# Patient Record
Sex: Female | Born: 1950 | Race: White | Hispanic: No | Marital: Married | Smoking: Never smoker
Health system: Southern US, Community
[De-identification: ages and names within clinical notes are randomized; demographics above are authoritative.]

## PROBLEM LIST (undated history)

## (undated) DIAGNOSIS — E119 Type 2 diabetes mellitus without complications: Secondary | ICD-10-CM

## (undated) DIAGNOSIS — Z973 Presence of spectacles and contact lenses: Secondary | ICD-10-CM

## (undated) DIAGNOSIS — N183 Chronic kidney disease, stage 3 unspecified: Secondary | ICD-10-CM

## (undated) DIAGNOSIS — E7800 Pure hypercholesterolemia, unspecified: Secondary | ICD-10-CM

## (undated) DIAGNOSIS — E282 Polycystic ovarian syndrome: Secondary | ICD-10-CM

## (undated) DIAGNOSIS — N950 Postmenopausal bleeding: Secondary | ICD-10-CM

## (undated) DIAGNOSIS — I10 Essential (primary) hypertension: Secondary | ICD-10-CM

## (undated) DIAGNOSIS — K219 Gastro-esophageal reflux disease without esophagitis: Secondary | ICD-10-CM

## (undated) DIAGNOSIS — C539 Malignant neoplasm of cervix uteri, unspecified: Secondary | ICD-10-CM

## (undated) DIAGNOSIS — N888 Other specified noninflammatory disorders of cervix uteri: Secondary | ICD-10-CM

## (undated) DIAGNOSIS — D539 Nutritional anemia, unspecified: Secondary | ICD-10-CM

## (undated) DIAGNOSIS — Z923 Personal history of irradiation: Secondary | ICD-10-CM

## (undated) HISTORY — DX: Polycystic ovarian syndrome: E28.2

## (undated) HISTORY — DX: Essential (primary) hypertension: I10

## (undated) HISTORY — DX: Pure hypercholesterolemia, unspecified: E78.00

## (undated) HISTORY — DX: Postmenopausal bleeding: N95.0

## (undated) HISTORY — DX: Other specified noninflammatory disorders of cervix uteri: N88.8

## (undated) HISTORY — PX: WISDOM TOOTH EXTRACTION: SHX21

## (undated) HISTORY — DX: Type 2 diabetes mellitus without complications: E11.9

---

## 1997-12-08 ENCOUNTER — Other Ambulatory Visit: Admission: RE | Admit: 1997-12-08 | Discharge: 1997-12-08 | Payer: Self-pay | Admitting: Gynecology

## 2017-02-06 ENCOUNTER — Other Ambulatory Visit: Payer: Self-pay | Admitting: Adult Health

## 2017-02-06 DIAGNOSIS — Z1231 Encounter for screening mammogram for malignant neoplasm of breast: Secondary | ICD-10-CM

## 2019-11-03 ENCOUNTER — Ambulatory Visit: Payer: Medicare PPO | Attending: Internal Medicine

## 2019-11-03 DIAGNOSIS — Z23 Encounter for immunization: Secondary | ICD-10-CM | POA: Insufficient documentation

## 2019-11-03 NOTE — Progress Notes (Signed)
   Covid-19 Vaccination Clinic  Name:  Misty Deleon    MRN: [id number] DOB: 02-09-1951  11/03/2019  Misty Deleon was observed post Covid-19 immunization for 15 minutes without incidence. She was provided with Vaccine Information Sheet and instruction to access the V-Safe system.   Misty Deleon was instructed to call 911 with any severe reactions post vaccine: Marland Kitchen Difficulty breathing  . Swelling of your face and throat  . A fast heartbeat  . A bad rash all over your body  . Dizziness and weakness    Immunizations Administered    Name Date Dose VIS Date Route   Pfizer COVID-19 Vaccine 11/03/2019  9:12 AM 0.3 mL 08/22/2019 Intramuscular   Manufacturer: [place]   Lot: [id number]   [place]: [id number]

## 2019-11-25 ENCOUNTER — Ambulatory Visit: Payer: Medicare PPO | Attending: Internal Medicine

## 2019-11-25 DIAGNOSIS — Z23 Encounter for immunization: Secondary | ICD-10-CM

## 2019-11-25 NOTE — Progress Notes (Signed)
   Covid-19 Vaccination Clinic  Name:  Misty Deleon    MRN: [id number] DOB: 08/09/1951  11/25/2019  [name] was observed post Covid-19 immunization for 15 minutes without incident. She was provided with Vaccine Information Sheet and instruction to access the V-Safe system.   [name] was instructed to call 911 with any severe reactions post vaccine: Marland Kitchen Difficulty breathing  . Swelling of face and throat  . A fast heartbeat  . A bad rash all over body  . Dizziness and weakness   Immunizations Administered    Name Date Dose VIS Date Route   Pfizer COVID-19 Vaccine 11/25/2019  8:51 AM 0.3 mL 08/22/2019 Intramuscular   Manufacturer: [place]   Lot: [id number]   [place]: [id number]

## 2020-08-19 ENCOUNTER — Encounter: Payer: Self-pay | Admitting: Gynecologic Oncology

## 2020-08-20 ENCOUNTER — Encounter: Payer: Self-pay | Admitting: Gynecologic Oncology

## 2020-08-20 ENCOUNTER — Other Ambulatory Visit: Payer: Self-pay

## 2020-08-20 ENCOUNTER — Inpatient Hospital Stay (HOSPITAL_BASED_OUTPATIENT_CLINIC_OR_DEPARTMENT_OTHER): Payer: Medicare PPO | Admitting: Gynecologic Oncology

## 2020-08-20 ENCOUNTER — Inpatient Hospital Stay: Payer: Medicare PPO | Attending: Gynecologic Oncology

## 2020-08-20 VITALS — BP 131/68 | HR 105 | Temp 96.6°F | Resp 18 | Ht 62.0 in | Wt 215.0 lb

## 2020-08-20 DIAGNOSIS — E7800 Pure hypercholesterolemia, unspecified: Secondary | ICD-10-CM | POA: Insufficient documentation

## 2020-08-20 DIAGNOSIS — Z79899 Other long term (current) drug therapy: Secondary | ICD-10-CM | POA: Diagnosis not present

## 2020-08-20 DIAGNOSIS — E282 Polycystic ovarian syndrome: Secondary | ICD-10-CM | POA: Insufficient documentation

## 2020-08-20 DIAGNOSIS — Z7984 Long term (current) use of oral hypoglycemic drugs: Secondary | ICD-10-CM | POA: Insufficient documentation

## 2020-08-20 DIAGNOSIS — I10 Essential (primary) hypertension: Secondary | ICD-10-CM | POA: Diagnosis not present

## 2020-08-20 DIAGNOSIS — D539 Nutritional anemia, unspecified: Secondary | ICD-10-CM | POA: Diagnosis not present

## 2020-08-20 DIAGNOSIS — N183 Chronic kidney disease, stage 3 unspecified: Secondary | ICD-10-CM | POA: Insufficient documentation

## 2020-08-20 DIAGNOSIS — E119 Type 2 diabetes mellitus without complications: Secondary | ICD-10-CM | POA: Diagnosis not present

## 2020-08-20 DIAGNOSIS — C539 Malignant neoplasm of cervix uteri, unspecified: Secondary | ICD-10-CM | POA: Diagnosis not present

## 2020-08-20 DIAGNOSIS — N939 Abnormal uterine and vaginal bleeding, unspecified: Secondary | ICD-10-CM

## 2020-08-20 DIAGNOSIS — R87614 Cytologic evidence of malignancy on smear of cervix: Secondary | ICD-10-CM

## 2020-08-20 LAB — CBC (CANCER CENTER ONLY)
HCT: 28.6 % — ABNORMAL LOW (ref 36.0–46.0)
Hemoglobin: 9.7 g/dL — ABNORMAL LOW (ref 12.0–15.0)
MCH: 29.1 pg (ref 26.0–34.0)
MCHC: 33.9 g/dL (ref 30.0–36.0)
MCV: 85.9 fL (ref 80.0–100.0)
Platelet Count: 368 10*3/uL (ref 150–400)
RBC: 3.33 MIL/uL — ABNORMAL LOW (ref 3.87–5.11)
RDW: 13.2 % (ref 11.5–15.5)
WBC Count: 9.6 10*3/uL (ref 4.0–10.5)
nRBC: 0 % (ref 0.0–0.2)

## 2020-08-20 LAB — BASIC METABOLIC PANEL - CANCER CENTER ONLY
Anion gap: 13 (ref 5–15)
BUN: 15 mg/dL (ref 8–23)
CO2: 26 mmol/L (ref 22–32)
Calcium: 9.9 mg/dL (ref 8.9–10.3)
Chloride: 97 mmol/L — ABNORMAL LOW (ref 98–111)
Creatinine: 1.34 mg/dL — ABNORMAL HIGH (ref 0.44–1.00)
GFR, Estimated: 43 mL/min — ABNORMAL LOW (ref >60)
Glucose, Bld: 166 mg/dL — ABNORMAL HIGH (ref 70–99)
Potassium: 3.6 mmol/L (ref 3.5–5.1)
Sodium: 136 mmol/L (ref 135–145)

## 2020-08-20 NOTE — Patient Instructions (Signed)
It was a pleasure meeting you today.  I will call you once I have the results of your imaging studies back.  I will also let you know when we get the biopsy results back from your gynecologist clinic.  Based on my exam today, there is a tumor replacing most of the cervix.  This is compatible with a cervix cancer.  I do not feel definite evidence that the cancer has spread beyond the cervix.  The biopsy and imaging studies will help [place] make the decision about the next step in your treatment, which will either be talking about radical surgery or chemotherapy with radiation.

## 2020-08-20 NOTE — Progress Notes (Signed)
GYNECOLOGIC ONCOLOGY NEW PATIENT CONSULTATION   Patient Name: Misty Deleon  Patient Age: 69 y.o. Date of Service: 08/20/20 Referring Provider: Dr. Murrell Redden  Primary Care Provider: Alvester Chou, NP Consulting Provider: Jeral Pinch, MD   Assessment/Plan:  Postmenopausal female presenting with at clinical stage I B2 cervical cancer.  I reviewed in detail with the patient her Pap test results as well as my exam today, consistent with cervical cancer.  She had a biopsy performed on 12/3 which is still pending as of this morning.  Based on my exam, I cannot definitively say that there is extension of the tumor beyond the cervix, although I am concerned given the size of the tumor and her high-grade histology noted on Pap smear.  We reviewed the patient's diagnosis of early stage cervical cancer. On today's examination she is a clinical stage IB2 with a gross lesion estimated at 3-4 cm. Treatment options include radical hysterectomy with pelvic lymphadenectomy or radiation. The risks and benefits of both were reviewed. I have emphasized that the cure rates for these two treatments are equivalent; however, I discussed that the toxicity profiles are different between these two modalities. I think the patient may be a candidate for surgery, although I am concerned based on the pathologic risk factors that I already know, she may be at very high risk of needing adjuvant therapy.  Ultimately, if her biopsy results as well as imaging confirm a high likelihood of needing radiation treatment after surgical intervention, then I will very likely recommend primary chemoradiation.  Risks of primary radiotherapy with sensitizing chemotherapy can include cystitis, proctitis, chronic rectal bleeding sigmoid stricture, small bowel obstruction, ureteral stricture, urinary or enteral fistula, loss of sexual function related to vaginal shortening and atrophy and menopause due to loss of ovarian function.   Surgery  is associated with risks of bleeding, blood transfusion, injury to the bowel, bladder, ureter, urinary fistula, vaginal dehiscence, prolonged or permanent bladder dysfunction potentially requiring self-catheterization, lymphedema, infection, thromboembolic events, and possible effects on sexual function. We discussed that intraoperatively, the finding of previously unsuspected spread outside the cervix may lead to the surgery being aborted. Additionally, high or intermediate-risk factors on the final pathology report, such as positive lymph nodes, deep invasion, large tumor size and lymphatic invasion, will likely lead to a recommendation of postoperative chemotherapy and radiation.   She desires to proceed with radical hysterectomy. Prior to surgery, a PET/CT has been/will be ordered to rule out any distant metastasis that would preclude the patient from being a surgical candidate.   Assuming that the patient is a surgical candidate following imaging, radical hysterectomy via a minimally invasive approach or abdominal procedure was discussed. We reviewed that prior to early 2018 our practice had been to routinely proceed with a robotic assisted procedure. We discussed that prospective data with endometrial cancer showed that outcomes from a minimally invasive procedure were not inferior as compared to one performed via laparotomy. We also discussed that retrospective data evaluating minimally invasive radical hysterectomy had not demonstrated worse outcomes as compared to an abdominal procedure. However, recent prospective data has suggested that there may be worse outcomes from a minimally invasive procedure. This data was published in November, 2018. A retrospective study from a [place] cancer database also seems to support this conclusion. We reviewed the results of these two studies.  I discussed my concern with the patient regarding her BMI and my ability to perform an adequate radical hysterectomy, which I  think will be more challenging  with an open procedure than minimally invasive. The pros and cons of both procedures were discussed in detail based on available published data. These include, but are not limited to, blood loss, injury to surrounding organs, lymphedema, need for further surgery, wound complications, VTE, and long-term bladder and bowel dysfunction.   Given the limitations of my exam today, I am going to get a pelvic MRI in addition to a PET scan.  I will follow up with her once I have the biopsy results as well as her imaging results, and we will finalize a treatment plan.  All the patient's questions were answered today.  REFERENCES:  1. Rosendo Gros PT, Frumovitz M, Pareja R, et al. Minimally invasive versus abdominal radical hysterectomy for cervicalcancer. N Engl J Med. DOI: 37.1062/IRSWNI6270350.\cb3  2. Melamed A, Margul DJ, Chen L, et al. Survival after minimally invasive radical hysterectomy for early-stage cervical cancer. N Engl J Med. KXF:81.8299/[id number]  3. Sert BM, Boggess JF, Heron Sabins, et al. Robot=assisted versus open radical hysterectomy: a multi-onstitutional experience for early-stage cervical cancer. Eur J Surg Oncol. 2016;42:513-22.  4. Concepcion Elk, Harlin Heys, [place], [place], [place]. Surgical and oncologic outcomes after robotic radical hysterectomy as compared to open radical hysterectomy in the treatment of early cervical cancer. J Gynecol Oncol. 2017;28(6):e82.  5. Soliman PT, Frumovitz M, Sun CC, et al. Radical hysterectomy: a comparison of surgical approaches after adoption of robotic surgery in gynecologic oncology. Gynecol Oncol. 2011;123:333-336.iation  A copy of this note was sent to the patient's referring provider.   65 minutes of total time was spent for this patient encounter, including preparation, face-to-face counseling with the patient and coordination of care, and documentation of the encounter.   Jeral Pinch, MD  Division of  Gynecologic Oncology  Department of Obstetrics and Gynecology  [facility]  ___________________________________________  Chief Complaint: Chief Complaint  Patient presents with  . Papanicolaou smear of cervix with cytologic evidence of mal  . Vaginal Bleeding    History of Present Illness:  Misty Deleon is a 69 y.o. y.o. female who is seen in consultation at the request of Dr. Murrell Redden an evaluation of postmenopausal bleeding and Pap test revealing poorly differentiated cancer.  The patient presented with postmenopausal bleeding.  She was seen for her annual exam on 12/3.  She endorses intermittent late bleeding for the last several months and then a month ago had heavy bleeding with clots, similar to menses.  Last Pap test was approximately 10 years ago, unsure if she has ever had an abnormal Pap.   Cervical mass noted on exam.  Pap test revealed poorly differentiated malignant cells.  Biopsy taken on that today are still pending.  Today, patient reports spotting intermittently for some number of months.  Initially this was just when she wiped after voiding and would experience this every 1-4 weeks.  Week before Thanksgiving, the patient experienced heavy bleeding with passage of clots that was similar to her menses.  Bleeding occurred for a couple of days, would stop, and then start again.  She called after this episode to be seen.  She denies any associated pain or cramping.  She does endorse having some brief cramping this morning.  She denies any weight loss although has had some decreased appetite since her visit on the third.  She denies any nausea or emesis.  She reports normal bowel and bladder function.  Her history is notable for diabetes, for which she takes Metformin.  She has hypertension and hyperlipidemia.  She denies any shortness of breath or chest pain at rest or with ambulation.  She lives in [place] with her husband.  She is a retired  Patent examiner.  PAST MEDICAL HISTORY:  Past Medical History:  Diagnosis Date  . Cervical mass   . Diabetes mellitus without complication ([place])   . Hypercholesteremia   . Hypertension   . PCOS (polycystic ovarian syndrome)   . PMB (postmenopausal bleeding)      PAST SURGICAL HISTORY:  Past Surgical History:  Procedure Laterality Date  . CESAREAN SECTION     X 2  . WISDOM TOOTH EXTRACTION     1980's    OB/GYN HISTORY:  OB History  Gravida Para Term Preterm AB Living  2 2          SAB IAB Ectopic Multiple Live Births               # Outcome Date GA Lbr Len/2nd Weight Sex Delivery Anes PTL Lv  2 Para           1 Para             No LMP recorded.  Age at menarche: 40  Age at menopause: 34 Hx of HRT: Denies Hx of STDs: Denies Last pap: 12/3 - see HPI History of abnormal pap smears: Yes.  Patient reports having some procedure (she describes this as a scraping) in the office in the remote past.  Reports normal Pap smears after.  Her last Pap smear was at least 10 years ago.  SCREENING STUDIES:  Last mammogram: 2021  Last colonoscopy: n/a Last bone mineral density: n/a  MEDICATIONS: Outpatient Encounter Medications as of 08/20/2020  Medication Sig  . ezetimibe (ZETIA) 10 MG tablet Take 10 mg by mouth daily.  Marland Kitchen lisinopril (ZESTRIL) 20 MG tablet Take 20 mg by mouth daily.  . metFORMIN (GLUCOPHAGE) 1000 MG tablet Take 1,000 mg by mouth 2 (two) times daily with a meal.  . rosuvastatin (CRESTOR) 40 MG tablet Take 40 mg by mouth daily.  . sitaGLIPtin (JANUVIA) 50 MG tablet Take 50 mg by mouth 2 (two) times daily.   No facility-administered encounter medications on file as of 08/20/2020.    ALLERGIES:  No Known Allergies   FAMILY HISTORY:  Family History  Problem Relation Age of Onset  . Diabetes Father   . Brain cancer Son   . Breast cancer Neg Hx   . Colon cancer Neg Hx   . Ovarian cancer Neg Hx   . Endometrial cancer Neg Hx   . Prostate cancer Neg Hx    . Pancreatic cancer Neg Hx      SOCIAL HISTORY:  Social Connections: Not on file    REVIEW OF SYSTEMS:  Denies appetite changes, fevers, chills, fatigue, unexplained weight changes. Denies hearing loss, neck lumps or masses, mouth sores, ringing in ears or voice changes. Denies cough or wheezing.  Denies shortness of breath. Denies chest pain or palpitations. Denies leg swelling. Denies abdominal distention, pain, blood in stools, constipation, diarrhea, nausea, vomiting, or early satiety. Denies pain with intercourse, dysuria, frequency, hematuria or incontinence. Denies hot flashes, pelvic pain, vaginal bleeding or vaginal discharge.   Denies joint pain, back pain or muscle pain/cramps. Denies itching, rash, or wounds. Denies dizziness, headaches, numbness or seizures. Denies swollen lymph nodes or glands, denies easy bruising or bleeding. Denies anxiety, depression, confusion, or decreased concentration.  Physical Exam:  Vital Signs for this encounter:  Blood pressure 131/68, pulse (!) 105, temperature (!) 96.6 F (35.9 C), temperature source Tympanic, resp. rate 18, height 5\' 2"  (1.575 m), weight 215 lb (97.5 kg), SpO2 100 %. Body mass index is 39.32 kg/m. General: Alert, oriented, no acute distress.  HEENT: Normocephalic, atraumatic. Sclera anicteric.  Chest: Clear to auscultation bilaterally. No wheezes, rhonchi, or rales. Cardiovascular: Regular rate and rhythm, no murmurs, rubs, or gallops.  Abdomen: Obese. Normoactive bowel sounds. Soft, nondistended, nontender to palpation. No masses or hepatosplenomegaly appreciated. No palpable fluid wave.  Extremities: Grossly normal range of motion. Warm, well perfused. No edema bilaterally.  Skin: No rashes or lesions.  Lymphatics: No cervical, supraclavicular, or inguinal adenopathy.  Back: No CVA or back tenderness to palpation. GU:  Normal external female genitalia.  No lesions. No discharge or bleeding.              Bladder/urethra:  No lesions or masses, well supported bladder             Vagina: Mildly atrophic.             Cervix: Almost completely replaced by lesion that is friable with very atypical appearing vasculature.  The mass in total measures approximately 3-4 cm.  There is a small amount of normal cervix seen anteriorly, this is not seen posteriorly.  On bimanual exam, the cervix is completely flush with the vagina posteriorly.  I do not feel extension of the tumor onto the upper vagina.  The cervix itself is barrel-shaped and the tumor very firm.             Uterus: 8-10 cm, mobile, no obvious parametrial involvement on rectovaginal exam although the exam is somewhat limited by the patient's body habitus as well as discomfort with the exam.             Adnexa: No masses appreciated.  LABORATORY AND RADIOLOGIC DATA:  Outside medical records were reviewed to synthesize the above history, along with the history and physical obtained during the visit.   Lab Results  Component Value Date   WBC 9.6 08/20/2020   HGB 9.7 (L) 08/20/2020   HCT 28.6 (L) 08/20/2020   PLT 368 08/20/2020   GLUCOSE 166 (H) 08/20/2020   NA 136 08/20/2020   K 3.6 08/20/2020   CL 97 (L) 08/20/2020   CREATININE 1.34 (H) 08/20/2020   BUN 15 08/20/2020   CO2 26 08/20/2020    Pap 08/13/20: G3 carcinoma (epithelial cell abnormality) HPV not performed  Pelvic ultrasound exam: 12/6 Uterus measures 8.6 x 4.7 x 4.2 cm.  Endometrial lining appears thickened with internal vascularity, cannot exclude polyp or other etiology.  Ovaries within normal size limits but somewhat obscured.

## 2020-08-23 ENCOUNTER — Telehealth: Payer: Self-pay | Admitting: *Deleted

## 2020-08-23 NOTE — Telephone Encounter (Signed)
Late entry----called patient's PCP Friday 12/10 and left a message to call the office back.   Called the patient's PCP office again today and left a message to call the office back. Need to check on the patient's creatine levels

## 2020-08-23 NOTE — Telephone Encounter (Signed)
Returned the call from the patient's PCP office and left message requesting the patient's last creatine level.

## 2020-08-25 ENCOUNTER — Telehealth: Payer: Self-pay | Admitting: Oncology

## 2020-08-25 NOTE — Telephone Encounter (Signed)
Called [facility] [phone]) and asked if the cervical biopsy and pap smear results are squamous cell carcinoma or adenocarcinoma.  They are gong to do a slide review and call with results in 24-48 hours.

## 2020-08-26 ENCOUNTER — Ambulatory Visit (HOSPITAL_COMMUNITY)
Admission: RE | Admit: 2020-08-26 | Discharge: 2020-08-26 | Disposition: A | Discharge status: Home / Self Care | Payer: Medicare PPO | Source: Ambulatory Visit | Attending: Gynecologic Oncology | Admitting: Gynecologic Oncology

## 2020-08-26 DIAGNOSIS — C539 Malignant neoplasm of cervix uteri, unspecified: Secondary | ICD-10-CM | POA: Diagnosis not present

## 2020-08-26 MED ORDER — GADOBUTROL 1 MMOL/ML IV SOLN
10.0000 mL | Freq: Once | INTRAVENOUS | Status: AC | PRN
  Administered 2020-08-26: 20:00:00 10 mL via INTRAVENOUS

## 2020-08-27 ENCOUNTER — Telehealth: Payer: Self-pay | Admitting: Oncology

## 2020-08-27 NOTE — Telephone Encounter (Signed)
Dr. 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  He is going to fax over an addended report.

## 2020-08-30 ENCOUNTER — Encounter: Payer: Self-pay | Admitting: Oncology

## 2020-08-30 ENCOUNTER — Telehealth: Payer: Self-pay

## 2020-08-30 ENCOUNTER — Telehealth: Payer: Self-pay | Admitting: Gynecologic Oncology

## 2020-08-30 ENCOUNTER — Telehealth: Payer: Self-pay | Admitting: Oncology

## 2020-08-30 ENCOUNTER — Encounter: Payer: Self-pay | Admitting: Hematology and Oncology

## 2020-08-30 DIAGNOSIS — N183 Chronic kidney disease, stage 3 unspecified: Secondary | ICD-10-CM | POA: Insufficient documentation

## 2020-08-30 DIAGNOSIS — E119 Type 2 diabetes mellitus without complications: Secondary | ICD-10-CM | POA: Insufficient documentation

## 2020-08-30 DIAGNOSIS — C539 Malignant neoplasm of cervix uteri, unspecified: Secondary | ICD-10-CM | POA: Insufficient documentation

## 2020-08-30 DIAGNOSIS — N1831 Chronic kidney disease, stage 3a: Secondary | ICD-10-CM | POA: Insufficient documentation

## 2020-08-30 DIAGNOSIS — D539 Nutritional anemia, unspecified: Secondary | ICD-10-CM | POA: Insufficient documentation

## 2020-08-30 NOTE — Telephone Encounter (Signed)
Misty Deleon and scheduled appointments to see Dr. Alvy Bimler on 09/01/20 at 2 PM (1:30 arrival) and Dr. Sondra Come - 09/09/20 - 2:30 nurse eval, 3:00 consult. She verbalized agreement and understanding.  Encouraged her to call with any questions or needs.

## 2020-08-30 NOTE — Telephone Encounter (Signed)
Called the patient to review pelvic MRI results.  Despite outside biopsy review, MRI it is very consistent with an advanced cervical cancer.  Mass shows invasion into the parametria, and) Smitty to the right ureter, down into the upper vagina.  There also enlarged pelvic lymph nodes.  Given these findings, I would like to proceed with getting the patient scheduled to see medical and radiation oncology.  We will proceed with PET scan as scheduled.  Jeral Pinch MD Gynecologic Oncology

## 2020-08-30 NOTE — Telephone Encounter (Signed)
Received Serum Creatine from 03-29-20 at [facility]. Creatine was 1.08. BUN was 15.   Dr. Berline Lopes notified. Copy of labs sent to be scanned into patient's EMR and a copy to patient's paper chart.

## 2020-08-30 NOTE — Progress Notes (Signed)
Faxed request for slides to [facility] at [phone].

## 2020-09-01 ENCOUNTER — Encounter: Payer: Self-pay | Admitting: Hematology and Oncology

## 2020-09-01 ENCOUNTER — Inpatient Hospital Stay: Payer: Medicare PPO | Admitting: Hematology and Oncology

## 2020-09-01 ENCOUNTER — Encounter (HOSPITAL_COMMUNITY)
Admission: RE | Admit: 2020-09-01 | Discharge: 2020-09-01 | Disposition: A | Discharge status: Home / Self Care | Payer: Medicare PPO | Source: Ambulatory Visit | Attending: Gynecologic Oncology | Admitting: Gynecologic Oncology

## 2020-09-01 ENCOUNTER — Other Ambulatory Visit: Payer: Self-pay

## 2020-09-01 VITALS — BP 144/64 | HR 120 | Temp 99.0°F | Resp 17 | Ht 62.0 in | Wt 216.8 lb

## 2020-09-01 DIAGNOSIS — N183 Chronic kidney disease, stage 3 unspecified: Secondary | ICD-10-CM | POA: Diagnosis not present

## 2020-09-01 DIAGNOSIS — R590 Localized enlarged lymph nodes: Secondary | ICD-10-CM | POA: Diagnosis not present

## 2020-09-01 DIAGNOSIS — I700 Atherosclerosis of aorta: Secondary | ICD-10-CM | POA: Diagnosis not present

## 2020-09-01 DIAGNOSIS — C539 Malignant neoplasm of cervix uteri, unspecified: Secondary | ICD-10-CM

## 2020-09-01 DIAGNOSIS — R87614 Cytologic evidence of malignancy on smear of cervix: Secondary | ICD-10-CM | POA: Insufficient documentation

## 2020-09-01 DIAGNOSIS — D539 Nutritional anemia, unspecified: Secondary | ICD-10-CM

## 2020-09-01 DIAGNOSIS — E119 Type 2 diabetes mellitus without complications: Secondary | ICD-10-CM

## 2020-09-01 DIAGNOSIS — L089 Local infection of the skin and subcutaneous tissue, unspecified: Secondary | ICD-10-CM | POA: Insufficient documentation

## 2020-09-01 DIAGNOSIS — R7989 Other specified abnormal findings of blood chemistry: Secondary | ICD-10-CM | POA: Insufficient documentation

## 2020-09-01 LAB — GLUCOSE, CAPILLARY: Glucose-Capillary: 190 mg/dL — ABNORMAL HIGH (ref 70–99)

## 2020-09-01 MED ORDER — PROCHLORPERAZINE MALEATE 10 MG PO TABS: 10.0000 mg | ORAL_TABLET | Freq: Four times a day (QID) | ORAL | 1 refills | Status: DC | PRN

## 2020-09-01 MED ORDER — ONDANSETRON HCL 8 MG PO TABS: 8.0000 mg | ORAL_TABLET | Freq: Three times a day (TID) | ORAL | 1 refills | Status: DC | PRN

## 2020-09-01 MED ORDER — FLUDEOXYGLUCOSE F - 18 (FDG) INJECTION
10.7000 | Freq: Once | INTRAVENOUS | Status: AC | PRN
  Administered 2020-09-01: 10:00:00 10.7 via INTRAVENOUS

## 2020-09-01 MED ORDER — DOXYCYCLINE HYCLATE 100 MG PO TABS: 100.0000 mg | ORAL_TABLET | Freq: Two times a day (BID) | ORAL | 0 refills | Status: DC

## 2020-09-01 MED ORDER — LIDOCAINE-PRILOCAINE 2.5-2.5 % EX CREA: TOPICAL_CREAM | CUTANEOUS | 3 refills | Status: DC

## 2020-09-01 NOTE — Assessment & Plan Note (Signed)
I plan to order iron studies and vitamin B12 before restart treatment

## 2020-09-01 NOTE — Assessment & Plan Note (Signed)
She is aware about the risk of complications and possibly uncontrolled diabetes while on treatment We discussed the importance of aggressive dietary modification

## 2020-09-01 NOTE — Progress Notes (Signed)
START OFF PATHWAY REGIMEN - Other   [id number]:Cisplatin 40 mg/m2 IV D1 q7 Days + RT:   A cycle is every 7 days:     Cisplatin   **Always confirm dose/schedule in your pharmacy ordering system**  Patient Characteristics: Intent of Therapy: Curative Intent, Discussed with Patient 

## 2020-09-01 NOTE — Assessment & Plan Note (Signed)
I reviewed multiple imaging studies I agree with additional view on the MRI to delineate whether her bladder is involved Clinically, it does not appear she has signs or symptoms of bladder fistula PET CT scan did not review significant disease in pelvic lymphadenopathy She has appointment scheduled to see radiation oncologist next week We discussed the importance of concurrent chemoradiation therapy with curative intent  We discussed the role of chemotherapy. The intent is of curative intent.  We discussed some of the risks, benefits, side-effects of cisplatin and its role as chemo sensitizing agent. The plan for weekly cisplatin for x5 doses along with radiation treatment.  Some of the short term side-effects included, though not limited to, including weight loss, life threatening infections, risk of allergic reactions, need for transfusions of blood products, nausea, vomiting, change in bowel habits, loss of hair, admission to hospital for various reasons, and risks of death.   Long term side-effects are also discussed including risks of infertility, permanent damage to nerve function, hearing loss, chronic fatigue, kidney damage with possibility needing hemodialysis, and rare secondary malignancy including bone marrow disorders.  The patient is aware that the response rates discussed earlier is not guaranteed.  After a long discussion, patient made an informed decision to proceed with the prescribed plan of care.   Patient education material was dispensed.  I will schedule chemo education class and port placement before we start treatment I will see her back again first week of January with tentative plan for weekly cisplatin to start January 10 Given her mild renal dysfunction, I plan to reduce the dose of cisplatin to 35 mg per metered square

## 2020-09-01 NOTE — Progress Notes (Signed)
[place] CONSULT NOTE  Patient Care Team: Alvester Chou, NP as PCP - General (Nurse Practitioner) Jacqulyn Liner, RN as Oncology Nurse Navigator (Oncology)  ASSESSMENT & PLAN:  Cervical cancer [facility]) I reviewed multiple imaging studies I agree with additional view on the MRI to delineate whether her bladder is involved Clinically, it does not appear she has signs or symptoms of bladder fistula PET CT scan did not review significant disease in pelvic lymphadenopathy She has appointment scheduled to see radiation oncologist next week We discussed the importance of concurrent chemoradiation therapy with curative intent  We discussed the role of chemotherapy. The intent is of curative intent.  We discussed some of the risks, benefits, side-effects of cisplatin and its role as chemo sensitizing agent. The plan for weekly cisplatin for x5 doses along with radiation treatment.  Some of the short term side-effects included, though not limited to, including weight loss, life threatening infections, risk of allergic reactions, need for transfusions of blood products, nausea, vomiting, change in bowel habits, loss of hair, admission to hospital for various reasons, and risks of death.   Long term side-effects are also discussed including risks of infertility, permanent damage to nerve function, hearing loss, chronic fatigue, kidney damage with possibility needing hemodialysis, and rare secondary malignancy including bone marrow disorders.  The patient is aware that the response rates discussed earlier is not guaranteed.  After a long discussion, patient made an informed decision to proceed with the prescribed plan of care.   Patient education material was dispensed.  I will schedule chemo education class and port placement before we start treatment I will see her back again first week of January with tentative plan for weekly cisplatin to start January 10 Given her mild renal  dysfunction, I plan to reduce the dose of cisplatin to 35 mg per metered square  Chronic kidney disease (CKD), stage III (moderate) ([place]) This could be related to dehydration or she might have chronic kidney disease related to diabetes  We discussed the importance of aggressive hydration I recommend discontinuation of lisinopril before starting on treatment and avoid NSAID   Deficiency anemia I plan to order iron studies and vitamin B12 before restart treatment  Diabetes mellitus without complication ([place]) She is aware about the risk of complications and possibly uncontrolled diabetes while on treatment We discussed the importance of aggressive dietary modification  Skin infection The area of abnormal changes on the skin is due to skin infection/boil I drained a bit more pus from the area and plan to start her on doxycycline for 10 days I am hopeful she can finish her antibiotics before port placement   Orders Placed This Encounter  Procedures  . IR IMAGING GUIDED PORT INSERTION    Standing Status:   Future    Standing Expiration Date:   09/01/2021    Order Specific Question:   Reason for Exam (SYMPTOM  OR DIAGNOSIS REQUIRED)    Answer:   need port to start chemo on 1/7    Order Specific Question:   Preferred Imaging Location?    Answer:   [facility]  . CBC with Differential ([place] Only)    Standing Status:   Standing    Number of Occurrences:   20    Standing Expiration Date:   09/01/2021  . Basic Metabolic Panel - [place] Only    Standing Status:   Standing    Number of Occurrences:   20    Standing  Expiration Date:   09/01/2021  . Magnesium    Standing Status:   Standing    Number of Occurrences:   20    Standing Expiration Date:   09/01/2021  . Iron and TIBC    Standing Status:   Standing    Number of Occurrences:   1    Standing Expiration Date:   09/01/2021  . Ferritin    Standing Status:   Standing    Number of Occurrences:   1     Standing Expiration Date:   09/01/2021  . Vitamin B12    Standing Status:   Standing    Number of Occurrences:   1    Standing Expiration Date:   09/01/2021    The total time spent in the appointment was 60 minutes encounter with patients including review of chart and various tests results, discussions about plan of care and coordination of care plan   All questions were answered. The patient knows to call the clinic with any problems, questions or concerns. No barriers to learning was detected.  Heath Lark, MD 12/22/20213:20 PM  CHIEF COMPLAINTS/PURPOSE OF CONSULTATION:  Cervical cancer, for further management  HISTORY OF PRESENTING ILLNESS:  Misty Deleon 69 y.o. female is here because of recent diagnosis of cervical cancer The patient was diagnosed after work-up for abnormal postmenopausal bleeding She had passage of large amount of clots and heavy bleeding recently Since her recent biopsy, her bleeding is less She has occasional cramps and take ibuprofen on a regular basis  I have reviewed her chart and materials related to her cancer extensively and collaborated history with the patient. Summary of oncologic history is as follows: Oncology History  Cervical cancer ([place])  08/27/2020 Imaging   MRI pelvis Large cervical mass with pelvic adenopathy, at least T2B N1 disease. Question of involvement of posterior urinary bladder (T4) for which additional images have been requested. Patient will return for additional images to answer this question. (Thinner section axial T2 without fat sat and sagittal T2 weighted imaging also without fat saturation.)   Small field-of-view images show that the parametrial extension comes in very close proximity to the RIGHT ureter.   08/30/2020 Initial Diagnosis   Cervical cancer ([place])   08/30/2020 Cancer Staging   Staging form: Cervix Uteri, AJCC Version 9 - Clinical stage from 08/30/2020: Stage IIIC1 (cT2, cN1, cM0) - Signed by Heath Lark,  MD on 08/30/2020   09/01/2020 PET scan   1. Large cervical mass extending into the endometrial canal to the level of the uterine fundus, associated with pelvic adenopathy to the level of the RIGHT common iliac chain. Please refer to MRI for further detail. 2. No signs of solid organ uptake or metastatic disease to the chest. 3. Subcutaneous nodule with marked increased metabolic activity, correlation with direct clinical inspection is suggested to exclude cutaneous neoplasm or subcutaneous nodule in this location. Complicated sebaceous cyst could also potentially have this appearance. 4. Uptake in the anal canal could likely physiologic. Given the intense nature of the uptake would suggest correlation with direct clinical inspection/examination. 5. Uptake in axillary lymph nodes with activity less than mediastinal blood pool likely small reactive lymph nodes, attention on follow-up. Morphologic features also with benign appearance.   09/20/2020 -  Chemotherapy   The patient had palonosetron (ALOXI) injection 0.25 mg, 0.25 mg, Intravenous,  Once, 0 of 6 cycles CISplatin (PLATINOL) 83 mg in sodium chloride 0.9 % 250 mL chemo infusion, 40 mg/m2 = 83 mg, Intravenous,  Once, 0 of 6 cycles fosaprepitant (EMEND) 150 mg in sodium chloride 0.9 % 145 mL IVPB, 150 mg, Intravenous,  Once, 0 of 6 cycles  for chemotherapy treatment.    Malignant neoplasm of cervix ([place])  09/01/2020 Initial Diagnosis   Malignant neoplasm of cervix ([place])   09/20/2020 -  Chemotherapy   The patient had palonosetron (ALOXI) injection 0.25 mg, 0.25 mg, Intravenous,  Once, 0 of 6 cycles CISplatin (PLATINOL) 83 mg in sodium chloride 0.9 % 250 mL chemo infusion, 40 mg/m2 = 83 mg, Intravenous,  Once, 0 of 6 cycles fosaprepitant (EMEND) 150 mg in sodium chloride 0.9 % 145 mL IVPB, 150 mg, Intravenous,  Once, 0 of 6 cycles  for chemotherapy treatment.     Her diabetes control at home is fair She denies peripheral  neuropathy  MEDICAL HISTORY:  Past Medical History:  Diagnosis Date  . Cervical mass   . Diabetes mellitus without complication ([place])   . Hypercholesteremia   . Hypertension   . PCOS (polycystic ovarian syndrome)   . PMB (postmenopausal bleeding)     SURGICAL HISTORY: Past Surgical History:  Procedure Laterality Date  . CESAREAN SECTION     X 2  . WISDOM TOOTH EXTRACTION     1980's    SOCIAL HISTORY: Social History   Socioeconomic History  . Marital status: Married    Spouse name: Not on file  . Number of children: 2  . Years of education: Not on file  . Highest education level: Not on file  Occupational History  . Occupation: retired Patent examiner  Tobacco Use  . Smoking status: Never Smoker  . Smokeless tobacco: Never Used  Vaping Use  . Vaping Use: Never used  Substance and Sexual Activity  . Alcohol use: Never  . Drug use: Never  . Sexual activity: Not Currently    Birth control/protection: Post-menopausal  Other Topics Concern  . Not on file  Social History Narrative  . Not on file   Social Determinants of Health   Financial Resource Strain: Not on file  Food Insecurity: Not on file  Transportation Needs: Not on file  Physical Activity: Not on file  Stress: Not on file  Social Connections: Not on file  Intimate Partner Violence: Not on file    FAMILY HISTORY: Family History  Problem Relation Age of Onset  . Diabetes Father   . Brain cancer Son   . Breast cancer Neg Hx   . Colon cancer Neg Hx   . Ovarian cancer Neg Hx   . Endometrial cancer Neg Hx   . Prostate cancer Neg Hx   . Pancreatic cancer Neg Hx     ALLERGIES:  has No Known Allergies.  MEDICATIONS:  Current Outpatient Medications  Medication Sig Dispense Refill  . doxycycline (VIBRA-TABS) 100 MG tablet Take 1 tablet (100 mg total) by mouth 2 (two) times daily. 20 tablet 0  . ezetimibe (ZETIA) 10 MG tablet Take 10 mg by mouth daily.    Marland Kitchen lidocaine-prilocaine (EMLA) cream  Apply to affected area once 30 g 3  . lisinopril (ZESTRIL) 20 MG tablet Take 20 mg by mouth daily.    . metFORMIN (GLUCOPHAGE) 1000 MG tablet Take 1,000 mg by mouth 2 (two) times daily with a meal.    . ondansetron (ZOFRAN) 8 MG tablet Take 1 tablet (8 mg total) by mouth every 8 (eight) hours as needed. Start on the third day after cisplatin chemotherapy. 30 tablet 1  . prochlorperazine (COMPAZINE)  10 MG tablet Take 1 tablet (10 mg total) by mouth every 6 (six) hours as needed (Nausea or vomiting). 30 tablet 1  . rosuvastatin (CRESTOR) 40 MG tablet Take 40 mg by mouth daily.    . sitaGLIPtin (JANUVIA) 50 MG tablet Take 50 mg by mouth 2 (two) times daily.     No current facility-administered medications for this visit.    REVIEW OF SYSTEMS: She has recurrent skin infection on her back, recently drained by her husband Constitutional: Denies fevers, chills or abnormal night sweats Eyes: Denies blurriness of vision, double vision or watery eyes Ears, nose, mouth, throat, and face: Denies mucositis or sore throat Respiratory: Denies cough, dyspnea or wheezes Cardiovascular: Denies palpitation, chest discomfort or lower extremity swelling Gastrointestinal:  Denies nausea, heartburn or change in bowel habits Lymphatics: Denies new lymphadenopathy or easy bruising Neurological:Denies numbness, tingling or new weaknesses Behavioral/Psych: Mood is stable, no new changes  All other systems were reviewed with the patient and are negative.  PHYSICAL EXAMINATION: ECOG PERFORMANCE STATUS: 1 - Symptomatic but completely ambulatory  Vitals:   09/01/20 1356  BP: (!) 144/64  Pulse: (!) 120  Resp: 17  Temp: 99 F (37.2 C)  SpO2: 99%   Filed Weights   09/01/20 1356  Weight: 216 lb 12.8 oz (98.3 kg)    GENERAL:alert, no distress and comfortable SKIN: Noted skin boil on her back.  I drained a bit more pus out of that EYES: normal, conjunctiva are pink and non-injected, sclera clear OROPHARYNX:no  exudate, no erythema and lips, buccal mucosa, and tongue normal  NECK: supple, thyroid normal size, non-tender, without nodularity LYMPH:  no palpable lymphadenopathy in the cervical, axillary or inguinal LUNGS: clear to auscultation and percussion with normal breathing effort HEART: regular rate & rhythm and no murmurs and no lower extremity edema ABDOMEN:abdomen soft, non-tender and normal bowel sounds Musculoskeletal:no cyanosis of digits and no clubbing  PSYCH: alert & oriented x 3 with fluent speech NEURO: no focal motor/sensory deficits  LABORATORY DATA:  I have reviewed the data as listed Lab Results  Component Value Date   WBC 9.6 08/20/2020   HGB 9.7 (L) 08/20/2020   HCT 28.6 (L) 08/20/2020   MCV 85.9 08/20/2020   PLT 368 08/20/2020   Recent Labs    08/20/20 0959  NA 136  K 3.6  CL 97*  CO2 26  GLUCOSE 166*  BUN 15  CREATININE 1.34*  CALCIUM 9.9  GFRNONAA 43*    RADIOGRAPHIC STUDIES: I have reviewed PET CT scan with the patient I have personally reviewed the radiological images as listed and agreed with the findings in the report. MR PELVIS W WO CONTRAST  Result Date: 08/27/2020 CLINICAL DATA:  Cervical cancer, staging evaluation. EXAM: MRI PELVIS WITHOUT AND WITH CONTRAST TECHNIQUE: Multiplanar multisequence MR imaging of the pelvis was performed both before and after administration of intravenous contrast. CONTRAST:  23mL GADAVIST GADOBUTROL 1 MMOL/ML IV SOLN COMPARISON:  None FINDINGS: Urinary Tract: No sign of hydronephrosis. Bifid collecting system on the LEFT. No distal ureteral distension. Parametrial extension on the RIGHT comes within 1-2 mm of the distal RIGHT ureter best seen on image 15 of series 5. indistinct boundary between the cervix and urinary bladder on single sagittal image also seen on sagittal image 20 of series 7 in addition to sagittal image 20 of series 18 Bowel: Colonic diverticulosis. No acute small bowel process or colonic process to the  extent visualized. Vascular/Lymphatic: Vascular structures in the pelvis are patent. Heterogeneous  enhancing LEFT external iliac lymph node (image 25, series 14) 1.4 cm. RIGHT internal iliac lymph node anterior to the sacrum 16 mm short axis dimension. Common iliac lymph node, lateral common iliac on the RIGHT 16 mm on image 10 of series 14. Posterior common iliac lymph node on the RIGHT (image 10, series 14 between psoas and spine measuring 9 mm with similar enhancement characteristics. Reproductive: Cervical mass measuring 4.2 x 3.9 x 4.3 cm. Stromal disruption and parametrial extension on the RIGHT involving approximately 120 degrees of the RIGHT lateral aspect of the cervix. Extension towards the RIGHT ureter as outlined above. Extension into the upper vagina. LEFT parametrial extension noted on image 15 of series 5. Extension also into the upper vagina greater on the LEFT than the RIGHT. Indistinct margin between the anterior aspect of this cervical mass in the mid cervix and the urinary bladder best seen on image 20 of series 7. Urinary bladder under distended. Other:  No ascites Musculoskeletal: No suspicious bone lesions identified. IMPRESSION: Large cervical mass with pelvic adenopathy, at least T2B N1 disease. Question of involvement of posterior urinary bladder (T4) for which additional images have been requested. Patient will return for additional images to answer this question. (Thinner section axial T2 without fat sat and sagittal T2 weighted imaging also without fat saturation.) Small field-of-view images show that the parametrial extension comes in very close proximity to the RIGHT ureter. Electronically Signed   By: Zetta Bills M.D.   On: 08/27/2020 16:02   NM PET Image Initial (PI) Skull Base To Thigh  Result Date: 09/01/2020 CLINICAL DATA:  Initial treatment strategy for uterine/cervical cancer staging in this 69 year old female. EXAM: NUCLEAR MEDICINE PET SKULL BASE TO THIGH TECHNIQUE:  10.7 mCi F-18 FDG was injected intravenously. Full-ring PET imaging was performed from the skull base to thigh after the radiotracer. CT data was obtained and used for attenuation correction and anatomic localization. Fasting blood glucose: 190 mg/dl COMPARISON:  MR pelvis from 08/26/2020 FINDINGS: Mediastinal blood pool activity: SUV max 3.98 Liver activity: SUV max NA NECK: No hypermetabolic lymph nodes in the neck. Incidental CT findings: none CHEST: Normal morphologic appearance of bilateral axillary lymph nodes with mild FDG uptake, maximum SUV of uptake in a LEFT axillary lymph node on image 63 of series 4 is 2.8. No frankly hypermetabolic lymph nodes in the chest Intensely hypermetabolic area in the subcutaneous fat and along the dermal layer of LEFT back (image 64, series 4) this is just to the LEFT of midline corresponding to a small ovoid area measuring 12 mm with a maximum SUV of 7.8 Incidental CT findings: Minimal calcified atheromatous plaque. Heart size is normal without pericardial effusion. Aortic caliber is normal. Central pulmonary vascular caliber is normal. Limited assessment of cardiovascular structures given lack of intravenous contrast. Calcified granuloma in the LEFT upper lobe. Calcified granulomata of about the LEFT hilum. No effusion. No consolidation. Airways are patent. Scattered small lymph nodes throughout the mediastinum ABDOMEN/PELVIS: Markedly hypermetabolic cervical mass. Hypermetabolic activity extends into the uterine body. There was some distension of the endometrium and heterogeneous signal within the endometrial canal on the previous study. Maximum SUV in the cervix and uterus, nearly uniform in terms of FDG uptake is approximately 20.1. This uptake encompasses approximately 8 cm craniocaudal extent along the axis of the uterus from the cervix through the uterine fundus. RIGHT common iliac lymph node (image 147, series 4) 1.4 cm, maximum SUV of 14.5 Similar activity seen in a  smaller lymph node  outlined in the MRI as well along posterior and lateral common iliac chain on image 147 as well this measures approximately 9 mm short axis. Internal iliac lymph node on the RIGHT with a maximum SUV of 10 measuring 1.4 cm on image 153 of series 4. LEFT external iliac lymph node (image 160, series 4) similarly hypermetabolic. Scattered small lymph nodes throughout the retroperitoneum lack profound uptake. No focal area of solid organ uptake. Background liver uptake is heterogeneous. Hypermetabolic area along the anal canal, of uncertain significance though likely physiologic Incidental CT findings: Mildly lobular hepatic contours. Spleen top normal size. Sludge in the gallbladder. Pancreas with normal noncontrast appearance. Adrenal glands are normal. Nephrolithiasis on the RIGHT, 5 mm upper pole calculus 7 mm RIGHT lower pole calculus. No hydronephrosis. Colonic diverticulosis. Normal appendix. Aortic atherosclerosis in the abdomen without aneurysmal dilation. Pelvic adenopathy as seen on recent MRI of the pelvis. See above for details regarding cervix and cervical uptake. SKELETON: No focal hypermetabolic activity to suggest skeletal metastasis. Soft tissue lesion as described in the upper to mid back in the subcutaneous fat and superficial soft tissues. Incidental CT findings: none IMPRESSION: 1. Large cervical mass extending into the endometrial canal to the level of the uterine fundus, associated with pelvic adenopathy to the level of the RIGHT common iliac chain. Please refer to MRI for further detail. 2. No signs of solid organ uptake or metastatic disease to the chest. 3. Subcutaneous nodule with marked increased metabolic activity, correlation with direct clinical inspection is suggested to exclude cutaneous neoplasm or subcutaneous nodule in this location. Complicated sebaceous cyst could also potentially have this appearance. 4. Uptake in the anal canal could likely physiologic. Given  the intense nature of the uptake would suggest correlation with direct clinical inspection/examination. 5. Uptake in axillary lymph nodes with activity less than mediastinal blood pool likely small reactive lymph nodes, attention on follow-up. Morphologic features also with benign appearance. Electronically Signed   By: Zetta Bills M.D.   On: 09/01/2020 12:46

## 2020-09-01 NOTE — Assessment & Plan Note (Signed)
The area of abnormal changes on the skin is due to skin infection/boil I drained a bit more pus from the area and plan to start her on doxycycline for 10 days I am hopeful she can finish her antibiotics before port placement

## 2020-09-01 NOTE — Assessment & Plan Note (Signed)
This could be related to dehydration or she might have chronic kidney disease related to diabetes  We discussed the importance of aggressive hydration I recommend discontinuation of lisinopril before starting on treatment and avoid NSAID

## 2020-09-06 ENCOUNTER — Other Ambulatory Visit: Payer: Self-pay | Admitting: Oncology

## 2020-09-06 ENCOUNTER — Encounter: Payer: Self-pay | Admitting: Gynecologic Oncology

## 2020-09-06 NOTE — Progress Notes (Signed)
I received a call from reading radiologist of additional pelvic MRI images. Reviewed image findings which are concerning for bladder serosa involvement by the tumor as well as involvement of the peritoneal reflection at the level of the sigmoid. We also discussed the FDG avid cystic lesion on the patient's back. I relayed these findings to the patient and she notes a history of approximately 10 years of intermittent cystic lesion in the  same place that sometimes requires antibiotic therapy. She is currently on an antibiotic.  Pricilla Holm MD

## 2020-09-06 NOTE — Progress Notes (Signed)
Gynecologic Oncology Multi-Disciplinary Disposition Conference Note  Date of the Conference: 09/06/2020  Patient Name: Misty Deleon  Referring Provider: Dr. Murrell Redden Primary GYN Oncologist: Dr. Berline Lopes  Stage/Disposition:  Clinical stage IB2 cervical cancer. Disposition is to primary chemoradiation.   This Multidisciplinary conference took place involving physicians from [place], [place], Radiation Oncology, Pathology, Radiology along with the Gynecologic Oncology Nurse Practitioner and RN.  Comprehensive assessment of the patient's malignancy, staging, need for surgery, chemotherapy, radiation therapy, and need for further testing were reviewed. Supportive measures, both inpatient and following discharge were also discussed. The recommended plan of care is documented. Greater than 35 minutes were spent correlating and coordinating this patient's care.

## 2020-09-08 ENCOUNTER — Institutional Professional Consult (permissible substitution): Payer: Medicare PPO | Admitting: Radiation Oncology

## 2020-09-09 ENCOUNTER — Encounter: Payer: Self-pay | Admitting: Hematology and Oncology

## 2020-09-09 ENCOUNTER — Other Ambulatory Visit: Payer: Self-pay

## 2020-09-09 ENCOUNTER — Ambulatory Visit
Admission: RE | Admit: 2020-09-09 | Discharge: 2020-09-09 | Disposition: A | Discharge status: Home / Self Care | Payer: Medicare PPO | Source: Ambulatory Visit | Attending: Radiation Oncology | Admitting: Radiation Oncology

## 2020-09-09 ENCOUNTER — Encounter: Payer: Self-pay | Admitting: Radiation Oncology

## 2020-09-09 ENCOUNTER — Inpatient Hospital Stay: Payer: Medicare PPO

## 2020-09-09 VITALS — BP 159/73 | HR 106 | Temp 97.4°F | Resp 18 | Ht 62.0 in | Wt 213.1 lb

## 2020-09-09 DIAGNOSIS — K5730 Diverticulosis of large intestine without perforation or abscess without bleeding: Secondary | ICD-10-CM | POA: Insufficient documentation

## 2020-09-09 DIAGNOSIS — E282 Polycystic ovarian syndrome: Secondary | ICD-10-CM | POA: Insufficient documentation

## 2020-09-09 DIAGNOSIS — C539 Malignant neoplasm of cervix uteri, unspecified: Secondary | ICD-10-CM | POA: Insufficient documentation

## 2020-09-09 DIAGNOSIS — Z7984 Long term (current) use of oral hypoglycemic drugs: Secondary | ICD-10-CM | POA: Diagnosis not present

## 2020-09-09 DIAGNOSIS — E119 Type 2 diabetes mellitus without complications: Secondary | ICD-10-CM | POA: Insufficient documentation

## 2020-09-09 DIAGNOSIS — N200 Calculus of kidney: Secondary | ICD-10-CM | POA: Diagnosis not present

## 2020-09-09 DIAGNOSIS — Z809 Family history of malignant neoplasm, unspecified: Secondary | ICD-10-CM | POA: Diagnosis not present

## 2020-09-09 DIAGNOSIS — Z79899 Other long term (current) drug therapy: Secondary | ICD-10-CM | POA: Diagnosis not present

## 2020-09-09 DIAGNOSIS — I10 Essential (primary) hypertension: Secondary | ICD-10-CM | POA: Insufficient documentation

## 2020-09-09 DIAGNOSIS — E7800 Pure hypercholesterolemia, unspecified: Secondary | ICD-10-CM | POA: Insufficient documentation

## 2020-09-09 NOTE — Progress Notes (Signed)
Met with patient in lobby to introduce myself as Financial Resource Specialist and to offer available resources.  Discussed one-time $1000 [employer] grant and qualifications to assist with personal expenses while going through treatment.  Gave her my card if interested in applying and for any additional financial questions or concerns. 

## 2020-09-09 NOTE — Progress Notes (Signed)
Radiation none         [phone] ________________________________  Initial Outpatient Consultation  Name: Misty PoeConnie S Deleon MRN: [id number]  Date: 09/09/2020  DOB: 1950/10/11  WU:[id number]:Barr, Raynelle FanningJulie, NP  Marletta LorBarr, Julie, NP   REFERRING PHYSICIAN: Marletta LorBarr, Julie, NP  DIAGNOSIS: The encounter diagnosis was Malignant neoplasm of cervix, unspecified site [facility](HCC).  Clincal stage IIIC-1 (cT2, cN1, cM0) poorly differentiated cervical cancer  HISTORY OF PRESENT ILLNESS::Misty Kathie RhodesS Thera Deleon is a 69 y.o. female who is seen as a courtesy of Dr. Pricilla Holmucker for an opinion concerning radiation therapy as part of management for her recently diagnosed cervical cancer.. The patient presented to Dr. Amado NashAlmquist on 08/13/2020 for her annual exam. At that time, she reported a several month history of intermittent postmenopausal bleeding. On examination, a cervical mass was noted. PAP smear revealed poorly differentiated malignant cells. 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  Subsequently, the patient was seen in consultation with Dr. Pricilla Holmucker on 08/20/2020, during which time they discussed treatment options including radical hysterectomy with pelvic lymphadenectomy vs radiation. The patient desired to proceed with surgery.  MRI of pelvis on 08/26/2020 showed a large cervical mass with pelvic adenopathy, at least T2B N1 disease. There was also question of involvement of posterior urinary bladder (T4) for which additional images were requested. There were, however, small field-of-view images that showed that the parametrial extension came in very close proximity to the right ureter. An addendum was added and stated that there was also abnormal signal that extended throughout the endometrial canal arising from the cervix. Distension of the cervical canal was present. Enhancement pattern was less than expected for frank extension of cervical tumor into the endometrium. Additional imaging were concerning for bladder  seroma involvement by the tumor as well as involvement of the peritoneal reflection at the level of the sigmoid. There was also noted to be an FDG avid cystic lesion on the patient's back (patient noted that it has been there for approximately 10 years and sometimes required antibiotic therapy, which she is currently on).  PET scan on 09/01/2020 showed a large cervical mass that extended into the endometrial canal to the level of the uterine fundus, associated with pelvic adenopathy to the level of the right common iliac chain. There were no signs of solid organ uptake or metastatic disease to the chest. Additionally, there was a subcutaneous nodule with marked increased metabolic activity; correlation with direct clinical inspection was suggested to exclude cutaneous neoplasm or subcutaneous nodule in that location. Complicated sebaceous cyst could also potentially have that appearance. There was also uptake noted in the anal canal that could have likely been physiologic. Given the intense nature of the uptake, correlation with direct clinical inspection/examination was recommended. Finally, there was uptake in axillary lymph nodes with activity less than mediastinal blood pool, likely small reactive lymph nodes. Morphologic features were also with benign appearance.  The patient was seen in consultation with Dr. Bertis RuddyGorsuch on 09/01/2020, during which time they discussed concurrent chemoradiation therapy with curative intent.  The patient's case was discussed at the Gynecologic Oncology Multi-Disciplinary Disposition Conference on 09/06/2020. It was recommended that she proceed with primary chemoradiation.  PREVIOUS RADIATION THERAPY: No  PAST MEDICAL HISTORY:  Past Medical History:  Diagnosis Date  . Cervical mass   . Diabetes mellitus without complication (HCC)   . Hypercholesteremia   . Hypertension   . PCOS (polycystic ovarian syndrome)   . PMB (postmenopausal bleeding)     PAST SURGICAL  HISTORY: Past Surgical History:  Procedure Laterality Date  . CESAREAN SECTION     X 2  . WISDOM TOOTH EXTRACTION     1980's    FAMILY HISTORY:  Family History  Problem Relation Age of Onset  . Diabetes Father   . Brain cancer Son   . Breast cancer Neg Hx   . Colon cancer Neg Hx   . Ovarian cancer Neg Hx   . Endometrial cancer Neg Hx   . Prostate cancer Neg Hx   . Pancreatic cancer Neg Hx     SOCIAL HISTORY:  Social History   Tobacco Use  . Smoking status: Never Smoker  . Smokeless tobacco: Never Used  Vaping Use  . Vaping Use: Never used  Substance Use Topics  . Alcohol use: Never  . Drug use: Never    ALLERGIES: No Known Allergies  MEDICATIONS:  Current Outpatient Medications  Medication Sig Dispense Refill  . doxycycline (VIBRA-TABS) 100 MG tablet Take 1 tablet (100 mg total) by mouth 2 (two) times daily. 20 tablet 0  . ezetimibe (ZETIA) 10 MG tablet Take 10 mg by mouth daily.    Marland Kitchen lidocaine-prilocaine (EMLA) cream Apply to affected area once 30 g 3  . lisinopril-hydrochlorothiazide (ZESTORETIC) 20-25 MG tablet     . metFORMIN (GLUCOPHAGE) 1000 MG tablet Take 1,000 mg by mouth 2 (two) times daily with a meal.    . ondansetron (ZOFRAN) 8 MG tablet Take 1 tablet (8 mg total) by mouth every 8 (eight) hours as needed. Start on the third day after cisplatin chemotherapy. 30 tablet 1  . prochlorperazine (COMPAZINE) 10 MG tablet Take 1 tablet (10 mg total) by mouth every 6 (six) hours as needed (Nausea or vomiting). 30 tablet 1  . rosuvastatin (CRESTOR) 40 MG tablet Take 40 mg by mouth daily.    . sitaGLIPtin (JANUVIA) 50 MG tablet Take 50 mg by mouth 2 (two) times daily.     No current facility-administered medications for this encounter.    REVIEW OF SYSTEMS:  A 10+ POINT REVIEW OF SYSTEMS WAS OBTAINED including neurology, dermatology, psychiatry, cardiac, respiratory, lymph, extremities, GI, GU, musculoskeletal, constitutional, reproductive, HEENT.  She denies any  pelvic or low back pain.  She denies any significant vaginal bleeding at this time.  She denies any hematuria or rectal bleeding.  Energy level is good at this time and appetite is good.   PHYSICAL EXAM:  height is 5\' 2"  (1.575 m) and weight is 213 lb 2 oz (96.7 kg). Her temporal temperature is 97.4 F (36.3 C) (abnormal). Her blood pressure is 159/73 (abnormal) and her pulse is 106 (abnormal). Her respiration is 18 and oxygen saturation is 100%.   General: Alert and oriented, in no acute distress HEENT: Head is normocephalic. Extraocular movements are intact.  Neck: Neck is supple, no palpable cervical or supraclavicular lymphadenopathy. Heart: Regular in rate and rhythm with no murmurs, rubs, or gallops. Chest: Clear to auscultation bilaterally, with no rhonchi, wheezes, or rales. Abdomen: Soft, nontender, nondistended, with no rigidity or guarding. Extremities: No cyanosis or edema. Lymphatics: see Neck Exam Skin: No concerning lesions. Musculoskeletal: symmetric strength and muscle tone throughout. Neurologic: Cranial nerves II through XII are grossly intact. No obvious focalities. Speech is fluent. Coordination is intact. Psychiatric: Judgment and insight are intact. Affect is appropriate. On pelvic exam the external genitalia are unremarkable.  No lesions noted.  On speculum examination the patient is noted to have a friable lesion which completely replaces the cervical region.  Minimal bleeding at this  time.  On bimanual and rectal vaginal examination the cervical mass is estimated to be approximately 4 cm.  Difficult to evaluate parametrial extension in light of the patient's body habitus.  No obvious extension into the vaginal area.  ECOG = 1  0 - Asymptomatic (Fully active, able to carry on all predisease activities without restriction)  1 - Symptomatic but completely ambulatory (Restricted in physically strenuous activity but ambulatory and able to carry out work of a light or  sedentary nature. For example, light housework, office work)  2 - Symptomatic, <50% in bed during the day (Ambulatory and capable of all self care but unable to carry out any work activities. Up and about more than 50% of waking hours)  3 - Symptomatic, >50% in bed, but not bedbound (Capable of only limited self-care, confined to bed or chair 50% or more of waking hours)  4 - Bedbound (Completely disabled. Cannot carry on any self-care. Totally confined to bed or chair)  5 - Death   Eustace Pen MM, Creech RH, Tormey DC, et al. [phone]). "Toxicity and response criteria of the [facility]". [place] Oncol. 5 (6): 649-55  LABORATORY DATA:  Lab Results  Component Value Date   WBC 9.6 08/20/2020   HGB 9.7 (L) 08/20/2020   HCT 28.6 (L) 08/20/2020   MCV 85.9 08/20/2020   PLT 368 08/20/2020   Lab Results  Component Value Date   NA 136 08/20/2020   K 3.6 08/20/2020   CL 97 (L) 08/20/2020   CO2 26 08/20/2020   GLUCOSE 166 (H) 08/20/2020   CREATININE 1.34 (H) 08/20/2020   CALCIUM 9.9 08/20/2020      RADIOGRAPHY: MR PELVIS W WO CONTRAST  Addendum Date: 09/06/2020   ADDENDUM REPORT: 09/06/2020 09:36 ADDENDUM: Findings were discussed with the referring physician Dr. Jeral Pinch at the time of dictation. Urinary bladder involvement more likely limited to serosal involvement at this time. Suggest attention on subsequent imaging to this area and the area along the parametrium and the APR. Electronically Signed   By: Zetta Bills M.D.   On: 09/06/2020 09:36   Addendum Date: 09/06/2020   ADDENDUM REPORT: 09/06/2020 08:17 ADDENDUM: Additional imaging acquired w on September 02, 2020 confirms focal bladder involvement along the posterior urinary bladder on the RIGHT with a small area of extension towards the posterior wall of the urinary bladder on the LEFT, best seen on images 22 and 19 of series 22 and images 17 and 18 of series 23, some mild distortion of the urinary  bladder associated with this process, suspect serosal involvement at these locations, RIGHT paramidline involvement likely with early mucosal involvement. On image 23 of series 22 there is focal protrusion of tumor along the posterior margin of the cervix extending into the anterior peritoneal reflection and abutting the distal sigmoid at the rectosigmoid junction. Findings of early T4 disease and definitive findings of parametrial extension confirmed on thinner section images on repeat images obtained on September 02, 2020. Also with extension of tumor into endometrial canal/uterus better demonstrated on current images with distension of the endometrial canal as well likely due to cervical obstruction in the setting of large cervical tumor. Electronically Signed   By: Zetta Bills M.D.   On: 09/06/2020 08:17   Addendum Date: 09/01/2020   ADDENDUM REPORT: 09/01/2020 16:05 ADDENDUM: For clarification, in addition to above findings there is abnormal signal that extends throughout the endometrial canal arising from the cervix. Distension of the cervical canal is  present. Enhancement pattern is less than expected for frank extension of cervical tumor into the endometrium. See subsequent PET imaging for further clarification where there is diffuse FDG uptake throughout the endometrium contiguous with the cervical mass. If further detail prior to radiation would help to change management repeat imaging could be performed as outlined in the initial report. Electronically Signed   By: Donzetta Kohut M.D.   On: 09/01/2020 16:05   Result Date: 09/06/2020 CLINICAL DATA:  Cervical cancer, staging evaluation. EXAM: MRI PELVIS WITHOUT AND WITH CONTRAST TECHNIQUE: Multiplanar multisequence MR imaging of the pelvis was performed both before and after administration of intravenous contrast. CONTRAST:  56mL GADAVIST GADOBUTROL 1 MMOL/ML IV SOLN COMPARISON:  None FINDINGS: Urinary Tract: No sign of hydronephrosis. Bifid  collecting system on the LEFT. No distal ureteral distension. Parametrial extension on the RIGHT comes within 1-2 mm of the distal RIGHT ureter best seen on image 15 of series 5. indistinct boundary between the cervix and urinary bladder on single sagittal image also seen on sagittal image 20 of series 7 in addition to sagittal image 20 of series 18 Bowel: Colonic diverticulosis. No acute small bowel process or colonic process to the extent visualized. Vascular/Lymphatic: Vascular structures in the pelvis are patent. Heterogeneous enhancing LEFT external iliac lymph node (image 25, series 14) 1.4 cm. RIGHT internal iliac lymph node anterior to the sacrum 16 mm short axis dimension. Common iliac lymph node, lateral common iliac on the RIGHT 16 mm on image 10 of series 14. Posterior common iliac lymph node on the RIGHT (image 10, series 14 between psoas and spine measuring 9 mm with similar enhancement characteristics. Reproductive: Cervical mass measuring 4.2 x 3.9 x 4.3 cm. Stromal disruption and parametrial extension on the RIGHT involving approximately 120 degrees of the RIGHT lateral aspect of the cervix. Extension towards the RIGHT ureter as outlined above. Extension into the upper vagina. LEFT parametrial extension noted on image 15 of series 5. Extension also into the upper vagina greater on the LEFT than the RIGHT. Indistinct margin between the anterior aspect of this cervical mass in the mid cervix and the urinary bladder best seen on image 20 of series 7. Urinary bladder under distended. Other:  No ascites Musculoskeletal: No suspicious bone lesions identified. IMPRESSION: Large cervical mass with pelvic adenopathy, at least T2B N1 disease. Question of involvement of posterior urinary bladder (T4) for which additional images have been requested. Patient will return for additional images to answer this question. (Thinner section axial T2 without fat sat and sagittal T2 weighted imaging also without fat  saturation.) Small field-of-view images show that the parametrial extension comes in very close proximity to the RIGHT ureter. Electronically Signed: By: Donzetta Kohut M.D. On: 08/27/2020 16:02   NM PET Image Initial (PI) Skull Base To Thigh  Result Date: 09/01/2020 CLINICAL DATA:  Initial treatment strategy for uterine/cervical cancer staging in this 69 year old female. EXAM: NUCLEAR MEDICINE PET SKULL BASE TO THIGH TECHNIQUE: 10.7 mCi F-18 FDG was injected intravenously. Full-ring PET imaging was performed from the skull base to thigh after the radiotracer. CT data was obtained and used for attenuation correction and anatomic localization. Fasting blood glucose: 190 mg/dl COMPARISON:  MR pelvis from 08/26/2020 FINDINGS: Mediastinal blood pool activity: SUV max 3.98 Liver activity: SUV max NA NECK: No hypermetabolic lymph nodes in the neck. Incidental CT findings: none CHEST: Normal morphologic appearance of bilateral axillary lymph nodes with mild FDG uptake, maximum SUV of uptake in a LEFT axillary lymph node  on image 63 of series 4 is 2.8. No frankly hypermetabolic lymph nodes in the chest Intensely hypermetabolic area in the subcutaneous fat and along the dermal layer of LEFT back (image 64, series 4) this is just to the LEFT of midline corresponding to a small ovoid area measuring 12 mm with a maximum SUV of 7.8 Incidental CT findings: Minimal calcified atheromatous plaque. Heart size is normal without pericardial effusion. Aortic caliber is normal. Central pulmonary vascular caliber is normal. Limited assessment of cardiovascular structures given lack of intravenous contrast. Calcified granuloma in the LEFT upper lobe. Calcified granulomata of about the LEFT hilum. No effusion. No consolidation. Airways are patent. Scattered small lymph nodes throughout the mediastinum ABDOMEN/PELVIS: Markedly hypermetabolic cervical mass. Hypermetabolic activity extends into the uterine body. There was some distension  of the endometrium and heterogeneous signal within the endometrial canal on the previous study. Maximum SUV in the cervix and uterus, nearly uniform in terms of FDG uptake is approximately 20.1. This uptake encompasses approximately 8 cm craniocaudal extent along the axis of the uterus from the cervix through the uterine fundus. RIGHT common iliac lymph node (image 147, series 4) 1.4 cm, maximum SUV of 14.5 Similar activity seen in a smaller lymph node outlined in the MRI as well along posterior and lateral common iliac chain on image 147 as well this measures approximately 9 mm short axis. Internal iliac lymph node on the RIGHT with a maximum SUV of 10 measuring 1.4 cm on image 153 of series 4. LEFT external iliac lymph node (image 160, series 4) similarly hypermetabolic. Scattered small lymph nodes throughout the retroperitoneum lack profound uptake. No focal area of solid organ uptake. Background liver uptake is heterogeneous. Hypermetabolic area along the anal canal, of uncertain significance though likely physiologic Incidental CT findings: Mildly lobular hepatic contours. Spleen top normal size. Sludge in the gallbladder. Pancreas with normal noncontrast appearance. Adrenal glands are normal. Nephrolithiasis on the RIGHT, 5 mm upper pole calculus 7 mm RIGHT lower pole calculus. No hydronephrosis. Colonic diverticulosis. Normal appendix. Aortic atherosclerosis in the abdomen without aneurysmal dilation. Pelvic adenopathy as seen on recent MRI of the pelvis. See above for details regarding cervix and cervical uptake. SKELETON: No focal hypermetabolic activity to suggest skeletal metastasis. Soft tissue lesion as described in the upper to mid back in the subcutaneous fat and superficial soft tissues. Incidental CT findings: none IMPRESSION: 1. Large cervical mass extending into the endometrial canal to the level of the uterine fundus, associated with pelvic adenopathy to the level of the RIGHT common iliac chain.  Please refer to MRI for further detail. 2. No signs of solid organ uptake or metastatic disease to the chest. 3. Subcutaneous nodule with marked increased metabolic activity, correlation with direct clinical inspection is suggested to exclude cutaneous neoplasm or subcutaneous nodule in this location. Complicated sebaceous cyst could also potentially have this appearance. 4. Uptake in the anal canal could likely physiologic. Given the intense nature of the uptake would suggest correlation with direct clinical inspection/examination. 5. Uptake in axillary lymph nodes with activity less than mediastinal blood pool likely small reactive lymph nodes, attention on follow-up. Morphologic features also with benign appearance. Electronically Signed   By: Zetta Bills M.D.   On: 09/01/2020 12:46      IMPRESSION: Clincal stage IIIC-1 (cT2, cN1, cM0) poorly differentiated cervical cancer.   Given the PET and MRI findings the patient is not a candidate for surgery.  Since disease is limited to the pelvis region on PET  scan she would be a candidate for curative treatment which would include external beam radiation therapy as well as intracavitary brachytherapy treatments.  In addition radiosensitizing chemotherapy would  be given during the patient's external beam treatments.  Today, I talked to the patient and  about the findings and work-up thus far.  We discussed the natural history of cervical cancer and general treatment, highlighting the role of radiotherapy (external beam and intracavitary brachytherapy) in the management.  We discussed the available radiation techniques, and focused on the details of logistics and delivery.  We reviewed the anticipated acute and late sequelae associated with radiation in this setting.  The patient was encouraged to ask questions that I answered to the best of my ability.  A patient consent form was discussed and signed.  We retained a copy for our records.  The patient would  like to proceed with radiation and will be scheduled for CT simulation.  PLAN: Patient will be scheduled for CT simulation next week.  She is scheduled to begin chemotherapy week of January 10.  Her radiation therapy will begin the same week.  Anticipate 6 weeks of external beam radiation therapy followed by 5 intracavitary brachytherapy treatments using iridium 192 as the high-dose-rate source.  Total time spent in this encounter was 65 minutes which included reviewing the patient's most recent biopsy, consultations, follow-ups, MRI of pelvis, PET scan, physical examination, and documentation.   ------------------------------------------------  Blair Promise, PhD, MD  This document serves as a record of services personally performed by Gery Pray, MD. It was created on his behalf by Clerance Lav, a trained medical scribe. The creation of this record is based on the scribe's personal observations and the provider's statements to them. This document has been checked and approved by the attending provider.

## 2020-09-09 NOTE — Progress Notes (Signed)
Patient here for a consult with Dr.Kinard.  GYN Location of Tumor / Histology: Cervical  [name] presented with symptoms of: vaginal spotting 06/2020  Past/Anticipated interventions by Gyn/Onc surgery, if any: none  Past/Anticipated interventions by medical oncology, if any: will have chemo starting 09/20/2020  Weight changes, if any: no  Bowel/Bladder complaints, if any: no  Nausea/Vomiting, if any: no  Pain issues, if any:  intermittent pelvic cramping  SAFETY ISSUES: Prior radiation? no Pacemaker/ICD? no Possible current pregnancy? Postmenopausal Is the patient on methotrexate? no  Current Complaints / other details:   BP (!) 159/73 (BP Location: Left Arm, Patient Position: Sitting)   Pulse (!) 106   Temp (!) 97.4 F (36.3 C) (Temporal)   Resp 18   Ht 5\' 2"  (1.575 m)   Wt 213 lb 2 oz (96.7 kg)   SpO2 100%   BMI 38.98 kg/m    Wt Readings from Last 3 Encounters:  09/09/20 213 lb 2 oz (96.7 kg)  09/01/20 216 lb 12.8 oz (98.3 kg)  08/20/20 215 lb (97.5 kg)

## 2020-09-13 NOTE — Progress Notes (Signed)
Pharmacist Chemotherapy Monitoring - Initial Assessment    Anticipated start date: 09/20/20  Regimen:  . Are orders appropriate based on the patient's diagnosis, regimen, and cycle? Yes . Does the plan date match the patient's scheduled date? Yes . Is the sequencing of drugs appropriate? Yes . Are the premedications appropriate for the patient's regimen? Yes . Prior Authorization for treatment is: Approved o If applicable, is the correct biosimilar selected based on the patient's insurance? not applicable  Organ Function and Labs: Marland Kitchen Are dose adjustments needed based on the patient's renal function, hepatic function, or hematologic function? Yes . Are appropriate labs ordered prior to the start of patient's treatment? Yes . Other organ system assessment, if indicated: N/A . The following baseline labs, if indicated, have been ordered: cisplatin: K, Mg  Dose Assessment: . Are the drug doses appropriate? Yes . Are the following correct: o Drug concentrations Yes o IV fluid compatible with drug Yes o Administration routes Yes o Timing of therapy Yes . If applicable, does the patient have documented access for treatment and/or plans for port-a-cath placement? yes . If applicable, have lifetime cumulative doses been properly documented and assessed? yes Lifetime Dose Tracking  No doses have been documented on this patient for the following tracked chemicals: Doxorubicin, Epirubicin, Idarubicin, Daunorubicin, Mitoxantrone, Bleomycin, Oxaliplatin, Carboplatin, Liposomal Doxorubicin  o   Toxicity Monitoring/Prevention: . The patient has the following take home antiemetics prescribed: Ondansetron and Prochlorperazine . The patient has the following take home medications prescribed: N/A . Medication allergies and previous infusion related reactions, if applicable, have been reviewed and addressed. Yes . The patient's current medication list has been assessed for drug-drug interactions with  their chemotherapy regimen. no significant drug-drug interactions were identified on review.  Order Review: . Are the treatment plan orders signed? Yes . Is the patient scheduled to see a provider prior to their treatment? Yes  I verify that I have reviewed each item in the above checklist and answered each question accordingly.   Ebony Hail, Pharm.D., CPP 09/13/2020@8 :11 AM

## 2020-09-14 ENCOUNTER — Encounter: Payer: Self-pay | Admitting: *Deleted

## 2020-09-14 ENCOUNTER — Other Ambulatory Visit: Payer: Self-pay | Admitting: Radiology

## 2020-09-14 NOTE — Progress Notes (Signed)
[facility] Clinical Social Work  Initial Assessment   Misty Deleon is a 70 y.o. year old female. Clinical Social Work was referred by distress screening for assessment of psychosocial needs.   SDOH (Social Determinants of Health) assessments performed: No   Distress Screen completed: Yes ONCBCN DISTRESS SCREENING 09/09/2020  Screening Type Initial Screening  Distress experienced in past week (1-10) 4  Emotional problem type Adjusting to illness  Referral to clinical social work -      Family/Social Information:  . Housing Arrangement: patient lives with spouse . Family members/support persons in your life? Family, Friends/Colleagues, Church- Patient has a daughter and two grandchildren. Her spouse is the pastor of a church and she also plays the organ for another church. She identified a large support system. . Transportation concerns: no  . Employment: Working part time. Income source: did not identify . Financial concerns: No o Type of concern: None . Food access concerns: no . Religious or spiritual practice: yes . Medication Concerns: no  . Services Currently in place:  None identified  Coping/ Adjustment to diagnosis: . Patient understands treatment plan and what happens next? yes . Concerns about diagnosis and/or treatment: I'm not especially worried about anything . Patient reported stressors: Adjusting to my illness . Hopes and priorities: Her main hope is the treatment will be effective in getting rid of cancer . Patient enjoys music and time with family/ friends . Current coping skills/ strengths: Ability for insight, Average or above average intelligence, Capable of independent living, Communication skills, Motivation for treatment/growth, Religious Affiliation, Supportive family/friends and Work skills    SUMMARY: Current SDOH Barriers:  . None identified  Clinical Social Work Clinical Goal(s):  Marland Kitchen None identified  Interventions: . Discussed common feeling and  emotions when being diagnosed with cancer, and the importance of support during treatment . Informed patient of the support team roles and support services at [facility] . Provided CSW contact information and encouraged patient to call with any questions or concerns  Follow Up Plan: Patient will contact CSW with any support or resource needs Patient verbalizes understanding of plan: Yes    Ernestene Mention , LCSW

## 2020-09-15 ENCOUNTER — Ambulatory Visit
Admission: RE | Admit: 2020-09-15 | Discharge: 2020-09-15 | Disposition: A | Discharge status: Home / Self Care | Payer: Medicare PPO | Source: Ambulatory Visit | Attending: Radiation Oncology | Admitting: Radiation Oncology

## 2020-09-15 ENCOUNTER — Other Ambulatory Visit: Payer: Self-pay

## 2020-09-15 DIAGNOSIS — Z510 Encounter for antineoplastic radiation therapy: Secondary | ICD-10-CM | POA: Diagnosis not present

## 2020-09-15 DIAGNOSIS — C539 Malignant neoplasm of cervix uteri, unspecified: Secondary | ICD-10-CM | POA: Diagnosis present

## 2020-09-16 ENCOUNTER — Other Ambulatory Visit: Payer: Self-pay

## 2020-09-16 ENCOUNTER — Encounter: Payer: Self-pay | Admitting: Hematology and Oncology

## 2020-09-16 ENCOUNTER — Inpatient Hospital Stay: Payer: Medicare PPO | Attending: Gynecologic Oncology

## 2020-09-16 ENCOUNTER — Ambulatory Visit (HOSPITAL_COMMUNITY)
Admission: RE | Admit: 2020-09-16 | Discharge: 2020-09-16 | Disposition: A | Discharge status: Home / Self Care | Payer: Medicare PPO | Source: Ambulatory Visit | Attending: Hematology and Oncology | Admitting: Hematology and Oncology

## 2020-09-16 ENCOUNTER — Encounter (HOSPITAL_COMMUNITY): Payer: Self-pay

## 2020-09-16 ENCOUNTER — Other Ambulatory Visit: Payer: Self-pay | Admitting: Hematology and Oncology

## 2020-09-16 ENCOUNTER — Telehealth: Payer: Self-pay

## 2020-09-16 ENCOUNTER — Inpatient Hospital Stay: Payer: Medicare PPO | Admitting: Hematology and Oncology

## 2020-09-16 DIAGNOSIS — C539 Malignant neoplasm of cervix uteri, unspecified: Secondary | ICD-10-CM

## 2020-09-16 DIAGNOSIS — I10 Essential (primary) hypertension: Secondary | ICD-10-CM | POA: Diagnosis not present

## 2020-09-16 DIAGNOSIS — N183 Chronic kidney disease, stage 3 unspecified: Secondary | ICD-10-CM

## 2020-09-16 DIAGNOSIS — E119 Type 2 diabetes mellitus without complications: Secondary | ICD-10-CM | POA: Insufficient documentation

## 2020-09-16 DIAGNOSIS — Z79899 Other long term (current) drug therapy: Secondary | ICD-10-CM | POA: Insufficient documentation

## 2020-09-16 DIAGNOSIS — E538 Deficiency of other specified B group vitamins: Secondary | ICD-10-CM

## 2020-09-16 DIAGNOSIS — D539 Nutritional anemia, unspecified: Secondary | ICD-10-CM

## 2020-09-16 DIAGNOSIS — I129 Hypertensive chronic kidney disease with stage 1 through stage 4 chronic kidney disease, or unspecified chronic kidney disease: Secondary | ICD-10-CM | POA: Insufficient documentation

## 2020-09-16 DIAGNOSIS — E7800 Pure hypercholesterolemia, unspecified: Secondary | ICD-10-CM | POA: Diagnosis not present

## 2020-09-16 DIAGNOSIS — D509 Iron deficiency anemia, unspecified: Secondary | ICD-10-CM | POA: Diagnosis not present

## 2020-09-16 DIAGNOSIS — Z5111 Encounter for antineoplastic chemotherapy: Secondary | ICD-10-CM | POA: Diagnosis not present

## 2020-09-16 DIAGNOSIS — Z7984 Long term (current) use of oral hypoglycemic drugs: Secondary | ICD-10-CM | POA: Insufficient documentation

## 2020-09-16 HISTORY — PX: IR IMAGING GUIDED PORT INSERTION: IMG5740

## 2020-09-16 LAB — CBC WITH DIFFERENTIAL (CANCER CENTER ONLY)
Abs Immature Granulocytes: 0.04 10*3/uL (ref 0.00–0.07)
Basophils Absolute: 0 10*3/uL (ref 0.0–0.1)
Basophils Relative: 0 %
Eosinophils Absolute: 0.2 10*3/uL (ref 0.0–0.5)
Eosinophils Relative: 2 %
HCT: 33.6 % — ABNORMAL LOW (ref 36.0–46.0)
Hemoglobin: 10.9 g/dL — ABNORMAL LOW (ref 12.0–15.0)
Immature Granulocytes: 0 %
Lymphocytes Relative: 17 %
Lymphs Abs: 1.7 10*3/uL (ref 0.7–4.0)
MCH: 27.9 pg (ref 26.0–34.0)
MCHC: 32.4 g/dL (ref 30.0–36.0)
MCV: 85.9 fL (ref 80.0–100.0)
Monocytes Absolute: 0.6 10*3/uL (ref 0.1–1.0)
Monocytes Relative: 6 %
Neutro Abs: 7.4 10*3/uL (ref 1.7–7.7)
Neutrophils Relative %: 75 %
Platelet Count: 389 10*3/uL (ref 150–400)
RBC: 3.91 MIL/uL (ref 3.87–5.11)
RDW: 13.2 % (ref 11.5–15.5)
WBC Count: 9.9 10*3/uL (ref 4.0–10.5)
nRBC: 0 % (ref 0.0–0.2)

## 2020-09-16 LAB — GLUCOSE, CAPILLARY: Glucose-Capillary: 152 mg/dL — ABNORMAL HIGH (ref 70–99)

## 2020-09-16 LAB — BASIC METABOLIC PANEL - CANCER CENTER ONLY
Anion gap: 13 (ref 5–15)
BUN: 20 mg/dL (ref 8–23)
CO2: 24 mmol/L (ref 22–32)
Calcium: 9.5 mg/dL (ref 8.9–10.3)
Chloride: 100 mmol/L (ref 98–111)
Creatinine: 1.22 mg/dL — ABNORMAL HIGH (ref 0.44–1.00)
GFR, Estimated: 48 mL/min — ABNORMAL LOW (ref >60)
Glucose, Bld: 155 mg/dL — ABNORMAL HIGH (ref 70–99)
Potassium: 3.7 mmol/L (ref 3.5–5.1)
Sodium: 137 mmol/L (ref 135–145)

## 2020-09-16 LAB — IRON AND TIBC
Iron: 42 ug/dL (ref 41–142)
Saturation Ratios: 12 % — ABNORMAL LOW (ref 21–57)
TIBC: 360 ug/dL (ref 236–444)
UIBC: 318 ug/dL (ref 120–384)

## 2020-09-16 LAB — FERRITIN: Ferritin: 17 ng/mL (ref 11–307)

## 2020-09-16 LAB — MAGNESIUM: Magnesium: 1.4 mg/dL — ABNORMAL LOW (ref 1.7–2.4)

## 2020-09-16 LAB — VITAMIN B12: Vitamin B-12: 152 pg/mL — ABNORMAL LOW (ref 180–914)

## 2020-09-16 MED ORDER — LIDOCAINE-EPINEPHRINE 1 %-1:100000 IJ SOLN
INTRAMUSCULAR | Status: AC
  Filled 2020-09-16: qty 1

## 2020-09-16 MED ORDER — LIDOCAINE-EPINEPHRINE 1 %-1:100000 IJ SOLN
INTRAMUSCULAR | Status: AC | PRN
  Administered 2020-09-16 (×2): 10 mL via INTRADERMAL

## 2020-09-16 MED ORDER — MAGNESIUM OXIDE 400 (241.3 MG) MG PO TABS: 400.0000 mg | ORAL_TABLET | Freq: Every day | ORAL | 11 refills | Status: DC

## 2020-09-16 MED ORDER — CEFAZOLIN SODIUM-DEXTROSE 2-4 GM/100ML-% IV SOLN
INTRAVENOUS | Status: AC
  Administered 2020-09-16: 2 g via INTRAVENOUS
  Filled 2020-09-16: qty 100

## 2020-09-16 MED ORDER — MIDAZOLAM HCL 2 MG/2ML IJ SOLN
INTRAMUSCULAR | Status: AC | PRN
  Administered 2020-09-16 (×3): 1 mg via INTRAVENOUS

## 2020-09-16 MED ORDER — FENTANYL CITRATE (PF) 100 MCG/2ML IJ SOLN
INTRAMUSCULAR | Status: AC | PRN
  Administered 2020-09-16 (×2): 50 ug via INTRAVENOUS

## 2020-09-16 MED ORDER — SODIUM CHLORIDE 0.9 % IV SOLN: INTRAVENOUS | Status: DC

## 2020-09-16 MED ORDER — CEFAZOLIN SODIUM-DEXTROSE 2-4 GM/100ML-% IV SOLN: 2.0000 g | INTRAVENOUS | Status: AC

## 2020-09-16 MED ORDER — FENTANYL CITRATE (PF) 100 MCG/2ML IJ SOLN
INTRAMUSCULAR | Status: AC
  Filled 2020-09-16: qty 2

## 2020-09-16 MED ORDER — MIDAZOLAM HCL 2 MG/2ML IJ SOLN
INTRAMUSCULAR | Status: AC
  Filled 2020-09-16: qty 4

## 2020-09-16 MED ORDER — HEPARIN SOD (PORK) LOCK FLUSH 100 UNIT/ML IV SOLN
INTRAVENOUS | Status: AC
  Filled 2020-09-16: qty 5

## 2020-09-16 NOTE — Assessment & Plan Note (Signed)
As part of the work-up for anemia, she was also found to have vitamin B12 deficiency This could increase her risk of peripheral neuropathy if untreated Plan to start her on weekly vitamin B12 for the first month of treatment along with her chemotherapy

## 2020-09-16 NOTE — Assessment & Plan Note (Signed)
She is getting port placement today We will start treatment next week I reviewed with her the plan of care and side effects of treatment and she is in agreement to proceed She is instructed to stop lisinopril/hydrochlorothiazide when she starts on treatment  I will see her every week for toxicity review along with blood work monitoring

## 2020-09-16 NOTE — Assessment & Plan Note (Signed)
This could be related to dehydration or she might have chronic kidney disease related to diabetes  We discussed the importance of aggressive hydration I recommend discontinuation of lisinopril before starting on treatment and avoid NSAID

## 2020-09-16 NOTE — Assessment & Plan Note (Signed)
I will start her on oral magnesium supplement daily

## 2020-09-16 NOTE — Telephone Encounter (Signed)
Called and left a message asking her to call the office back. Dr. Bertis Ruddy sent a Magnesium Rx to pharmacy for low magnesium level. B12 low and  treatment on 1/10  the infusion nurse will give injection to her during treatment.

## 2020-09-16 NOTE — Discharge Instructions (Signed)
Urgent needs - Interventional Radiology on call MD [phone]  Wound - May remove dressing and shower in 24 to 48 hours.  Keep site clean and dry.  Replace with bandaid as needed.  Do not submerge in tub or water until site healing well. If closed with glue, glue will flake off on its own.  If ordered by your provider, may start Emla cream in 2 weeks or after incision is healed.  After completion of treatment, your provider should have you set up for monthly port flushes.    Implanted Port Insertion, Care After This sheet gives you information about how to care for yourself after your procedure. Your health care provider may also give you more specific instructions. If you have problems or questions, contact your health care provider. What can I expect after the procedure? After the procedure, it is common to have:  Discomfort at the port insertion site.  Bruising on the skin over the port. This should improve over 3-4 days. Follow these instructions at home: [facility] care  After your port is placed, you will get a manufacturer's information card. The card has information about your port. Keep this card with you at all times.  Take care of the port as told by your health care provider. Ask your health care provider if you or a family member can get training for taking care of the port at home. A home health care nurse may also take care of the port.  Make sure to remember what type of port you have. Incision care   Follow instructions from your health care provider about how to take care of your port insertion site. Make sure you: ? Wash your hands with soap and water before and after you change your bandage (dressing). If soap and water are not available, use hand sanitizer. ? Change your dressing as told by your health care provider. ? Leave stitches (sutures), skin glue, or adhesive strips in place. These skin closures may need to stay in place for 2 weeks or longer. If adhesive strip  edges start to loosen and curl up, you may trim the loose edges. Do not remove adhesive strips completely unless your health care provider tells you to do that.  Check your port insertion site every day for signs of infection. Check for: ? Redness, swelling, or pain. ? Fluid or blood. ? Warmth. ? Pus or a bad smell. Activity  Return to your normal activities as told by your health care provider. Ask your health care provider what activities are safe for you.  Do not lift anything that is heavier than 10 lb (4.5 kg), or the limit that you are told, until your health care provider says that it is safe. General instructions  Take over-the-counter and prescription medicines only as told by your health care provider.  Do not take baths, swim, or use a hot tub until your health care provider approves. Ask your health care provider if you may take showers. You may only be allowed to take sponge baths.  Do not drive for 24 hours if you were given a sedative during your procedure.  Wear a medical alert bracelet in case of an emergency. This will tell any health care providers that you have a port.  Keep all follow-up visits as told by your health care provider. This is important. Contact a health care provider if:  You cannot flush your port with saline as directed, or you cannot draw blood from the port.  You have a fever or chills.  You have redness, swelling, or pain around your port insertion site.  You have fluid or blood coming from your port insertion site.  Your port insertion site feels warm to the touch.  You have pus or a bad smell coming from the port insertion site. Get help right away if:  You have chest pain or shortness of breath.  You have bleeding from your port that you cannot control. Summary  Take care of the port as told by your health care provider. Keep the manufacturer's information card with you at all times.  Change your dressing as told by your health  care provider.  Contact a health care provider if you have a fever or chills or if you have redness, swelling, or pain around your port insertion site.  Keep all follow-up visits as told by your health care provider. This information is not intended to replace advice given to you by your health care provider. Make sure you discuss any questions you have with your health care provider. Document Revised: 03/26/2018 Document Reviewed: 03/26/2018 Elsevier Patient Education  [place].  Moderate Conscious Sedation, Adult, Care After These instructions provide you with information about caring for yourself after your procedure. Your health care provider may also give you more specific instructions. Your treatment has been planned according to current medical practices, but problems sometimes occur. Call your health care provider if you have any problems or questions after your procedure. What can I expect after the procedure? After your procedure, it is common:  To feel sleepy for several hours.  To feel clumsy and have poor balance for several hours.  To have poor judgment for several hours.  To vomit if you eat too soon. Follow these instructions at home: For at least 24 hours after the procedure:   Do not: ? Participate in activities where you could fall or become injured. ? Drive. ? Use heavy machinery. ? Drink alcohol. ? Take sleeping pills or medicines that cause drowsiness. ? Make important decisions or sign legal documents. ? Take care of children on your own.  Rest. Eating and drinking  Follow the diet recommended by your health care provider.  If you vomit: ? Drink water, juice, or soup when you can drink without vomiting. ? Make sure you have little or no nausea before eating solid foods. General instructions  Have a responsible adult stay with you until you are awake and alert.  Take over-the-counter and prescription medicines only as told by your health  care provider.  If you smoke, do not smoke without supervision.  Keep all follow-up visits as told by your health care provider. This is important. Contact a health care provider if:  You keep feeling nauseous or you keep vomiting.  You feel light-headed.  You develop a rash.  You have a fever. Get help right away if:  You have trouble breathing. This information is not intended to replace advice given to you by your health care provider. Make sure you discuss any questions you have with your health care provider. Document Revised: 08/10/2017 Document Reviewed: 12/18/2015 Elsevier Patient Education  2020 [employer].

## 2020-09-16 NOTE — Assessment & Plan Note (Signed)
I plan to order iron studies and vitamin B12 before restart treatment

## 2020-09-16 NOTE — Consult Note (Signed)
Chief Complaint: Patient was seen in consultation today for Port-A-Cath placement  Referring Physician(s): [place]  Supervising Physician: Ruthann Cancer  Patient Status: [facility] - Out-pt  History of Present Illness: Misty Deleon is a 70 y.o. female with past medical history of diabetes, hypercholesterolemia, hypertension, polycystic ovarian syndrome and recently diagnosed cervical cancer who presents today for Port-A-Cath placement for chemotherapy.  Past Medical History:  Diagnosis Date  . Cervical mass   . Diabetes mellitus without complication ([place])   . Hypercholesteremia   . Hypertension   . PCOS (polycystic ovarian syndrome)   . PMB (postmenopausal bleeding)     Past Surgical History:  Procedure Laterality Date  . CESAREAN SECTION     X 2  . WISDOM TOOTH EXTRACTION     1980's    Allergies: Patient has no known allergies.  Medications: Prior to Admission medications   Medication Sig Start Date End Date Taking? Authorizing Provider  ezetimibe (ZETIA) 10 MG tablet Take 10 mg by mouth daily.    [provider]  lidocaine-prilocaine (EMLA) cream Apply to affected area once 09/01/20   Heath Lark, MD  magnesium oxide (MAG-OX) 400 (241.3 Mg) MG tablet Take 1 tablet (400 mg total) by mouth daily. 09/16/20   Heath Lark, MD  metFORMIN (GLUCOPHAGE) 1000 MG tablet Take 1,000 mg by mouth 2 (two) times daily with a meal.    [provider]  ondansetron (ZOFRAN) 8 MG tablet Take 1 tablet (8 mg total) by mouth every 8 (eight) hours as needed. Start on the third day after cisplatin chemotherapy. 09/01/20   Heath Lark, MD  prochlorperazine (COMPAZINE) 10 MG tablet Take 1 tablet (10 mg total) by mouth every 6 (six) hours as needed (Nausea or vomiting). 09/01/20   Heath Lark, MD  rosuvastatin (CRESTOR) 40 MG tablet Take 40 mg by mouth daily.    [provider]  sitaGLIPtin (JANUVIA) 50 MG tablet Take 50 mg by mouth 2 (two) times daily.    [provider]     Family History  Problem Relation Age of Onset  . Diabetes Father   . Brain cancer Son   . Breast cancer Neg Hx   . Colon cancer Neg Hx   . Ovarian cancer Neg Hx   . Endometrial cancer Neg Hx   . Prostate cancer Neg Hx   . Pancreatic cancer Neg Hx     Social History   Socioeconomic History  . Marital status: Married    Spouse name: Not on file  . Number of children: 2  . Years of education: Not on file  . Highest education level: Not on file  Occupational History  . Occupation: retired Patent examiner  Tobacco Use  . Smoking status: Never Smoker  . Smokeless tobacco: Never Used  Vaping Use  . Vaping Use: Never used  Substance and Sexual Activity  . Alcohol use: Never  . Drug use: Never  . Sexual activity: Not Currently    Birth control/protection: Post-menopausal  Other Topics Concern  . Not on file  Social History Narrative  . Not on file   Social Determinants of Health   Financial Resource Strain: Not on file  Food Insecurity: Not on file  Transportation Needs: Not on file  Physical Activity: Not on file  Stress: Not on file  Social Connections: Not on file     Review of Systems currently denies fever, headache, chest pain, dyspnea, cough, abdominal pain, back pain, nausea, vomiting.  She is  anxious and does continue to have a small amount of vaginal bleeding/spotting.  Vital Signs: Blood pressure 150/70, heart rate 116, temperature 97.7, respirations 18, O2 sat 100% room air    Physical Exam awake, alert.  Chest clear to auscultation bilaterally.  Heart with tachycardic but regular rhythm.  Abdomen soft, positive bowel sounds, nontender.  No lower extremity edema.  Imaging: MR PELVIS W WO CONTRAST  Addendum Date: 09/06/2020   ADDENDUM REPORT: 09/06/2020 09:36 ADDENDUM: Findings were discussed with the referring physician Dr. Jeral Pinch at the time of dictation. Urinary bladder involvement more likely limited to serosal  involvement at this time. Suggest attention on subsequent imaging to this area and the area along the parametrium and the APR. Electronically Signed   By: Zetta Bills M.D.   On: 09/06/2020 09:36   Addendum Date: 09/06/2020   ADDENDUM REPORT: 09/06/2020 08:17 ADDENDUM: Additional imaging acquired w on September 02, 2020 confirms focal bladder involvement along the posterior urinary bladder on the RIGHT with a small area of extension towards the posterior wall of the urinary bladder on the LEFT, best seen on images 22 and 19 of series 22 and images 17 and 18 of series 23, some mild distortion of the urinary bladder associated with this process, suspect serosal involvement at these locations, RIGHT paramidline involvement likely with early mucosal involvement. On image 23 of series 22 there is focal protrusion of tumor along the posterior margin of the cervix extending into the anterior peritoneal reflection and abutting the distal sigmoid at the rectosigmoid junction. Findings of early T4 disease and definitive findings of parametrial extension confirmed on thinner section images on repeat images obtained on September 02, 2020. Also with extension of tumor into endometrial canal/uterus better demonstrated on current images with distension of the endometrial canal as well likely due to cervical obstruction in the setting of large cervical tumor. Electronically Signed   By: Zetta Bills M.D.   On: 09/06/2020 08:17   Addendum Date: 09/01/2020   ADDENDUM REPORT: 09/01/2020 16:05 ADDENDUM: For clarification, in addition to above findings there is abnormal signal that extends throughout the endometrial canal arising from the cervix. Distension of the cervical canal is present. Enhancement pattern is less than expected for frank extension of cervical tumor into the endometrium. See subsequent PET imaging for further clarification where there is diffuse FDG uptake throughout the endometrium contiguous with the  cervical mass. If further detail prior to radiation would help to change management repeat imaging could be performed as outlined in the initial report. Electronically Signed   By: Zetta Bills M.D.   On: 09/01/2020 16:05   Result Date: 09/06/2020 CLINICAL DATA:  Cervical cancer, staging evaluation. EXAM: MRI PELVIS WITHOUT AND WITH CONTRAST TECHNIQUE: Multiplanar multisequence MR imaging of the pelvis was performed both before and after administration of intravenous contrast. CONTRAST:  78mL GADAVIST GADOBUTROL 1 MMOL/ML IV SOLN COMPARISON:  None FINDINGS: Urinary Tract: No sign of hydronephrosis. Bifid collecting system on the LEFT. No distal ureteral distension. Parametrial extension on the RIGHT comes within 1-2 mm of the distal RIGHT ureter best seen on image 15 of series 5. indistinct boundary between the cervix and urinary bladder on single sagittal image also seen on sagittal image 20 of series 7 in addition to sagittal image 20 of series 18 Bowel: Colonic diverticulosis. No acute small bowel process or colonic process to the extent visualized. Vascular/Lymphatic: Vascular structures in the pelvis are patent. Heterogeneous enhancing LEFT external iliac lymph node (image 25, series  14) 1.4 cm. RIGHT internal iliac lymph node anterior to the sacrum 16 mm short axis dimension. Common iliac lymph node, lateral common iliac on the RIGHT 16 mm on image 10 of series 14. Posterior common iliac lymph node on the RIGHT (image 10, series 14 between psoas and spine measuring 9 mm with similar enhancement characteristics. Reproductive: Cervical mass measuring 4.2 x 3.9 x 4.3 cm. Stromal disruption and parametrial extension on the RIGHT involving approximately 120 degrees of the RIGHT lateral aspect of the cervix. Extension towards the RIGHT ureter as outlined above. Extension into the upper vagina. LEFT parametrial extension noted on image 15 of series 5. Extension also into the upper vagina greater on the LEFT than  the RIGHT. Indistinct margin between the anterior aspect of this cervical mass in the mid cervix and the urinary bladder best seen on image 20 of series 7. Urinary bladder under distended. Other:  No ascites Musculoskeletal: No suspicious bone lesions identified. IMPRESSION: Large cervical mass with pelvic adenopathy, at least T2B N1 disease. Question of involvement of posterior urinary bladder (T4) for which additional images have been requested. Patient will return for additional images to answer this question. (Thinner section axial T2 without fat sat and sagittal T2 weighted imaging also without fat saturation.) Small field-of-view images show that the parametrial extension comes in very close proximity to the RIGHT ureter. Electronically Signed: By: Zetta Bills M.D. On: 08/27/2020 16:02   NM PET Image Initial (PI) Skull Base To Thigh  Result Date: 09/01/2020 CLINICAL DATA:  Initial treatment strategy for uterine/cervical cancer staging in this 70 year old female. EXAM: NUCLEAR MEDICINE PET SKULL BASE TO THIGH TECHNIQUE: 10.7 mCi F-18 FDG was injected intravenously. Full-ring PET imaging was performed from the skull base to thigh after the radiotracer. CT data was obtained and used for attenuation correction and anatomic localization. Fasting blood glucose: 190 mg/dl COMPARISON:  MR pelvis from 08/26/2020 FINDINGS: Mediastinal blood pool activity: SUV max 3.98 Liver activity: SUV max NA NECK: No hypermetabolic lymph nodes in the neck. Incidental CT findings: none CHEST: Normal morphologic appearance of bilateral axillary lymph nodes with mild FDG uptake, maximum SUV of uptake in a LEFT axillary lymph node on image 63 of series 4 is 2.8. No frankly hypermetabolic lymph nodes in the chest Intensely hypermetabolic area in the subcutaneous fat and along the dermal layer of LEFT back (image 64, series 4) this is just to the LEFT of midline corresponding to a small ovoid area measuring 12 mm with a maximum SUV  of 7.8 Incidental CT findings: Minimal calcified atheromatous plaque. Heart size is normal without pericardial effusion. Aortic caliber is normal. Central pulmonary vascular caliber is normal. Limited assessment of cardiovascular structures given lack of intravenous contrast. Calcified granuloma in the LEFT upper lobe. Calcified granulomata of about the LEFT hilum. No effusion. No consolidation. Airways are patent. Scattered small lymph nodes throughout the mediastinum ABDOMEN/PELVIS: Markedly hypermetabolic cervical mass. Hypermetabolic activity extends into the uterine body. There was some distension of the endometrium and heterogeneous signal within the endometrial canal on the previous study. Maximum SUV in the cervix and uterus, nearly uniform in terms of FDG uptake is approximately 20.1. This uptake encompasses approximately 8 cm craniocaudal extent along the axis of the uterus from the cervix through the uterine fundus. RIGHT common iliac lymph node (image 147, series 4) 1.4 cm, maximum SUV of 14.5 Similar activity seen in a smaller lymph node outlined in the MRI as well along posterior and lateral common iliac chain  on image 147 as well this measures approximately 9 mm short axis. Internal iliac lymph node on the RIGHT with a maximum SUV of 10 measuring 1.4 cm on image 153 of series 4. LEFT external iliac lymph node (image 160, series 4) similarly hypermetabolic. Scattered small lymph nodes throughout the retroperitoneum lack profound uptake. No focal area of solid organ uptake. Background liver uptake is heterogeneous. Hypermetabolic area along the anal canal, of uncertain significance though likely physiologic Incidental CT findings: Mildly lobular hepatic contours. Spleen top normal size. Sludge in the gallbladder. Pancreas with normal noncontrast appearance. Adrenal glands are normal. Nephrolithiasis on the RIGHT, 5 mm upper pole calculus 7 mm RIGHT lower pole calculus. No hydronephrosis. Colonic  diverticulosis. Normal appendix. Aortic atherosclerosis in the abdomen without aneurysmal dilation. Pelvic adenopathy as seen on recent MRI of the pelvis. See above for details regarding cervix and cervical uptake. SKELETON: No focal hypermetabolic activity to suggest skeletal metastasis. Soft tissue lesion as described in the upper to mid back in the subcutaneous fat and superficial soft tissues. Incidental CT findings: none IMPRESSION: 1. Large cervical mass extending into the endometrial canal to the level of the uterine fundus, associated with pelvic adenopathy to the level of the RIGHT common iliac chain. Please refer to MRI for further detail. 2. No signs of solid organ uptake or metastatic disease to the chest. 3. Subcutaneous nodule with marked increased metabolic activity, correlation with direct clinical inspection is suggested to exclude cutaneous neoplasm or subcutaneous nodule in this location. Complicated sebaceous cyst could also potentially have this appearance. 4. Uptake in the anal canal could likely physiologic. Given the intense nature of the uptake would suggest correlation with direct clinical inspection/examination. 5. Uptake in axillary lymph nodes with activity less than mediastinal blood pool likely small reactive lymph nodes, attention on follow-up. Morphologic features also with benign appearance. Electronically Signed   By: Zetta Bills M.D.   On: 09/01/2020 12:46    Labs:  CBC: Recent Labs    08/20/20 0959 09/16/20 1011  WBC 9.6 9.9  HGB 9.7* 10.9*  HCT 28.6* 33.6*  PLT 368 389    COAGS: No results for input(s): INR, APTT in the last 8760 hours.  BMP: Recent Labs    08/20/20 0959 09/16/20 1011  NA 136 137  K 3.6 3.7  CL 97* 100  CO2 26 24  GLUCOSE 166* 155*  BUN 15 20  CALCIUM 9.9 9.5  CREATININE 1.34* 1.22*  GFRNONAA 43* 48*    LIVER FUNCTION TESTS: No results for input(s): BILITOT, AST, ALT, ALKPHOS, PROT, ALBUMIN in the last 8760 hours.  TUMOR  MARKERS: No results for input(s): AFPTM, CEA, CA199, CHROMGRNA in the last 8760 hours.  Assessment and Plan: 70 y.o. female with past medical history of diabetes, hypercholesterolemia, hypertension, polycystic ovarian syndrome and recently diagnosed cervical cancer who presents today for Port-A-Cath placement for chemotherapy.Risks and benefits of image guided port-a-catheter placement was discussed with the patient including, but not limited to bleeding, infection, pneumothorax, or fibrin sheath development and need for additional procedures.  All of the patient's questions were answered, patient is agreeable to proceed. Consent signed and in chart.     Thank you for this interesting consult.  I greatly enjoyed meeting Misty Deleon and look forward to participating in their care.  A copy of this report was sent to the requesting provider on this date.  Electronically Signed: D. Rowe Robert, PA-C 09/16/2020, 12:07 PM   I spent a total of  25 minutes  in face to face in clinical consultation, greater than 50% of which was counseling/coordinating care for Port-A-Cath placement

## 2020-09-16 NOTE — Procedures (Signed)
Interventional Radiology Procedure Note ° °Procedure: Single Lumen Power Port Placement   ° °Access:  Right internal jugular vein ° °Findings: Catheter tip positioned at cavoatrial junction. Port is ready for immediate use.  ° °Complications: None ° °EBL: < 10 mL ° °Recommendations:  °- Ok to shower in 24 hours °- Do not submerge for 7 days °- Routine line care  ° ° °Johnda Billiot, MD ° ° ° °

## 2020-09-16 NOTE — Addendum Note (Signed)
Addended byBertis Ruddy, Authur Cubit on: 09/16/2020 12:21 PM   Modules accepted: Orders, Level of Service

## 2020-09-16 NOTE — Progress Notes (Addendum)
[facility] OFFICE PROGRESS NOTE  Patient Care Team: Marletta Lor, NP as PCP - General (Nurse Practitioner) Eduardo Osier, RN as Oncology Nurse Navigator (Oncology)  ASSESSMENT & PLAN:  Cervical cancer [facility]) She is getting port placement today We will start treatment next week I reviewed with her the plan of care and side effects of treatment and she is in agreement to proceed She is instructed to stop lisinopril/hydrochlorothiazide when she starts on treatment  I will see her every week for toxicity review along with blood work monitoring  Chronic kidney disease (CKD), stage III (moderate) (HCC) This could be related to dehydration or she might have chronic kidney disease related to diabetes  We discussed the importance of aggressive hydration I recommend discontinuation of lisinopril before starting on treatment and avoid NSAID   Deficiency anemia I plan to order iron studies and vitamin B12 before restart treatment  Hypomagnesemia I will start her on oral magnesium supplement daily  Vitamin B12 deficiency As part of the work-up for anemia, she was also found to have vitamin B12 deficiency This could increase her risk of peripheral neuropathy if untreated Plan to start her on weekly vitamin B12 for the first month of treatment along with her chemotherapy   No orders of the defined types were placed in this encounter.   All questions were answered. The patient knows to call the clinic with any problems, questions or concerns. The total time spent in the appointment was 20 minutes encounter with patients including review of chart and various tests results, discussions about plan of care and coordination of care plan   Artis Delay, MD 09/16/2020 12:21 PM  INTERVAL HISTORY: Please see below for problem oriented charting. She is seen prior to starting treatment She is scheduled for port placement today She has completed a course of antibiotics for skin infection  on her back and it has healed She is doing well Her blood pressure is mildly elevated but she attributed to anxiety She denies recent pelvic pain  SUMMARY OF ONCOLOGIC HISTORY: Oncology History  Cervical cancer (HCC)  08/13/2020 Pathology Results   Cervical biopsy showed poorly differentiated carcinoma. 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   08/27/2020 Imaging   MRI pelvis Large cervical mass with pelvic adenopathy, at least T2B N1 disease. Question of involvement of posterior urinary bladder (T4) for which additional images have been requested. Patient will return for additional images to answer this question. (Thinner section axial T2 without fat sat and sagittal T2 weighted imaging also without fat saturation.)   Small field-of-view images show that the parametrial extension comes in very close proximity to the RIGHT ureter.   08/30/2020 Initial Diagnosis   Cervical cancer (HCC)   08/30/2020 Cancer Staging   Staging form: Cervix Uteri, AJCC Version 9 - Clinical stage from 08/30/2020: Stage IIIC1 (cT2, cN1, cM0) - Signed by Artis Delay, MD on 08/30/2020   09/01/2020 PET scan   1. Large cervical mass extending into the endometrial canal to the level of the uterine fundus, associated with pelvic adenopathy to the level of the RIGHT common iliac chain. Please refer to MRI for further detail. 2. No signs of solid organ uptake or metastatic disease to the chest. 3. Subcutaneous nodule with marked increased metabolic activity, correlation with direct clinical inspection is suggested to exclude cutaneous neoplasm or subcutaneous nodule in this location. Complicated sebaceous cyst could also potentially have this appearance. 4. Uptake in the anal canal could  likely physiologic. Given the intense nature of the uptake would suggest correlation with direct clinical inspection/examination. 5. Uptake in axillary lymph nodes with activity less than mediastinal  blood pool likely small reactive lymph nodes, attention on follow-up. Morphologic features also with benign appearance.   09/20/2020 -  Chemotherapy   The patient had palonosetron (ALOXI) injection 0.25 mg, 0.25 mg, Intravenous,  Once, 0 of 6 cycles CISplatin (PLATINOL) 75 mg in sodium chloride 0.9 % 250 mL chemo infusion, 36 mg/m2 = 75 mg (90 % of original dose 40 mg/m2), Intravenous,  Once, 0 of 6 cycles Dose modification: 36 mg/m2 (90 % of original dose 40 mg/m2, Cycle 1, Reason: Change in SCr/CrCL) fosaprepitant (EMEND) 150 mg in sodium chloride 0.9 % 145 mL IVPB, 150 mg, Intravenous,  Once, 0 of 6 cycles  for chemotherapy treatment.    Malignant neoplasm of cervix (HCC)  09/01/2020 Initial Diagnosis   Malignant neoplasm of cervix (HCC)   09/20/2020 -  Chemotherapy   The patient had palonosetron (ALOXI) injection 0.25 mg, 0.25 mg, Intravenous,  Once, 0 of 6 cycles CISplatin (PLATINOL) 75 mg in sodium chloride 0.9 % 250 mL chemo infusion, 36 mg/m2 = 75 mg (90 % of original dose 40 mg/m2), Intravenous,  Once, 0 of 6 cycles Dose modification: 36 mg/m2 (90 % of original dose 40 mg/m2, Cycle 1, Reason: Change in SCr/CrCL) fosaprepitant (EMEND) 150 mg in sodium chloride 0.9 % 145 mL IVPB, 150 mg, Intravenous,  Once, 0 of 6 cycles  for chemotherapy treatment.      REVIEW OF SYSTEMS:   Constitutional: Denies fevers, chills or abnormal weight loss Eyes: Denies blurriness of vision Ears, nose, mouth, throat, and face: Denies mucositis or sore throat Respiratory: Denies cough, dyspnea or wheezes Cardiovascular: Denies palpitation, chest discomfort or lower extremity swelling Gastrointestinal:  Denies nausea, heartburn or change in bowel habits Skin: Denies abnormal skin rashes Lymphatics: Denies new lymphadenopathy or easy bruising Neurological:Denies numbness, tingling or new weaknesses Behavioral/Psych: Mood is stable, no new changes  All other systems were reviewed with the patient and are  negative.  I have reviewed the past medical history, past surgical history, social history and family history with the patient and they are unchanged from previous note.  ALLERGIES:  has No Known Allergies.  MEDICATIONS:  Current Outpatient Medications  Medication Sig Dispense Refill  . magnesium oxide (MAG-OX) 400 (241.3 Mg) MG tablet Take 1 tablet (400 mg total) by mouth daily. 30 tablet 11  . ezetimibe (ZETIA) 10 MG tablet Take 10 mg by mouth daily.    Marland Kitchen lidocaine-prilocaine (EMLA) cream Apply to affected area once 30 g 3  . metFORMIN (GLUCOPHAGE) 1000 MG tablet Take 1,000 mg by mouth 2 (two) times daily with a meal.    . ondansetron (ZOFRAN) 8 MG tablet Take 1 tablet (8 mg total) by mouth every 8 (eight) hours as needed. Start on the third day after cisplatin chemotherapy. 30 tablet 1  . prochlorperazine (COMPAZINE) 10 MG tablet Take 1 tablet (10 mg total) by mouth every 6 (six) hours as needed (Nausea or vomiting). 30 tablet 1  . rosuvastatin (CRESTOR) 40 MG tablet Take 40 mg by mouth daily.    . sitaGLIPtin (JANUVIA) 50 MG tablet Take 50 mg by mouth 2 (two) times daily.     No current facility-administered medications for this visit.   Facility-Administered Medications Ordered in Other Visits  Medication Dose Route Frequency Provider Last Rate Last Admin  . 0.9 %  sodium chloride infusion  Intravenous Continuous Allred, Darrell K, PA-C      . ceFAZolin (ANCEF) IVPB 2g/100 mL premix  2 g Intravenous to XRAY Allred, Darrell K, PA-C        PHYSICAL EXAMINATION: ECOG PERFORMANCE STATUS: 1 - Symptomatic but completely ambulatory  Vitals:   09/16/20 1046  BP: (!) 150/70  Pulse: (!) 116  Resp: 18  Temp: 97.7 F (36.5 C)  SpO2: 100%   Filed Weights   09/16/20 1046  Weight: 211 lb 6.4 oz (95.9 kg)    GENERAL:alert, no distress and comfortable NEURO: alert & oriented x 3 with fluent speech, no focal motor/sensory deficits  LABORATORY DATA:  I have reviewed the data as  listed    Component Value Date/Time   NA 137 09/16/2020 1011   K 3.7 09/16/2020 1011   CL 100 09/16/2020 1011   CO2 24 09/16/2020 1011   GLUCOSE 155 (H) 09/16/2020 1011   BUN 20 09/16/2020 1011   CREATININE 1.22 (H) 09/16/2020 1011   CALCIUM 9.5 09/16/2020 1011   GFRNONAA 48 (L) 09/16/2020 1011    No results found for: SPEP, UPEP  Lab Results  Component Value Date   WBC 9.9 09/16/2020   NEUTROABS 7.4 09/16/2020   HGB 10.9 (L) 09/16/2020   HCT 33.6 (L) 09/16/2020   MCV 85.9 09/16/2020   PLT 389 09/16/2020      Chemistry      Component Value Date/Time   NA 137 09/16/2020 1011   K 3.7 09/16/2020 1011   CL 100 09/16/2020 1011   CO2 24 09/16/2020 1011   BUN 20 09/16/2020 1011   CREATININE 1.22 (H) 09/16/2020 1011      Component Value Date/Time   CALCIUM 9.5 09/16/2020 1011

## 2020-09-17 NOTE — Telephone Encounter (Signed)
Called and given below message. She verbalized understanding. 

## 2020-09-19 DIAGNOSIS — Z510 Encounter for antineoplastic radiation therapy: Secondary | ICD-10-CM | POA: Diagnosis not present

## 2020-09-20 ENCOUNTER — Inpatient Hospital Stay: Payer: Medicare PPO

## 2020-09-20 ENCOUNTER — Other Ambulatory Visit: Payer: Self-pay

## 2020-09-20 VITALS — BP 123/54 | HR 88 | Temp 98.4°F | Resp 18

## 2020-09-20 DIAGNOSIS — Z5111 Encounter for antineoplastic chemotherapy: Secondary | ICD-10-CM | POA: Diagnosis not present

## 2020-09-20 DIAGNOSIS — C539 Malignant neoplasm of cervix uteri, unspecified: Secondary | ICD-10-CM

## 2020-09-20 MED ORDER — CYANOCOBALAMIN 1000 MCG/ML IJ SOLN
INTRAMUSCULAR | Status: AC
  Filled 2020-09-20: qty 1

## 2020-09-20 MED ORDER — SODIUM CHLORIDE 0.9 % IV SOLN
150.0000 mg | Freq: Once | INTRAVENOUS | Status: AC
  Administered 2020-09-20: 150 mg via INTRAVENOUS
  Filled 2020-09-20: qty 150

## 2020-09-20 MED ORDER — CYANOCOBALAMIN 1000 MCG/ML IJ SOLN
1000.0000 ug | Freq: Once | INTRAMUSCULAR | Status: AC
  Administered 2020-09-20: 1000 ug via INTRAMUSCULAR

## 2020-09-20 MED ORDER — PALONOSETRON HCL INJECTION 0.25 MG/5ML
0.2500 mg | Freq: Once | INTRAVENOUS | Status: AC
  Administered 2020-09-20: 0.25 mg via INTRAVENOUS

## 2020-09-20 MED ORDER — MAGNESIUM SULFATE 2 GM/50ML IV SOLN
INTRAVENOUS | Status: AC
  Filled 2020-09-20: qty 50

## 2020-09-20 MED ORDER — SODIUM CHLORIDE 0.9 % IV SOLN: Freq: Once | INTRAVENOUS | Status: DC

## 2020-09-20 MED ORDER — POTASSIUM CHLORIDE IN NACL 20-0.9 MEQ/L-% IV SOLN
Freq: Once | INTRAVENOUS | Status: AC
  Filled 2020-09-20: qty 1000

## 2020-09-20 MED ORDER — HEPARIN SOD (PORK) LOCK FLUSH 100 UNIT/ML IV SOLN
500.0000 [IU] | Freq: Once | INTRAVENOUS | Status: AC | PRN
  Administered 2020-09-20: 500 [IU]
  Filled 2020-09-20: qty 5

## 2020-09-20 MED ORDER — PALONOSETRON HCL INJECTION 0.25 MG/5ML
INTRAVENOUS | Status: AC
  Filled 2020-09-20: qty 5

## 2020-09-20 MED ORDER — SODIUM CHLORIDE 0.9 % IV SOLN
10.0000 mg | Freq: Once | INTRAVENOUS | Status: AC
  Administered 2020-09-20: 10 mg via INTRAVENOUS
  Filled 2020-09-20: qty 10

## 2020-09-20 MED ORDER — MAGNESIUM SULFATE 2 GM/50ML IV SOLN
2.0000 g | Freq: Once | INTRAVENOUS | Status: AC
  Administered 2020-09-20: 2 g via INTRAVENOUS

## 2020-09-20 MED ORDER — SODIUM CHLORIDE 0.9 % IV SOLN
Freq: Once | INTRAVENOUS | Status: AC
  Filled 2020-09-20: qty 250

## 2020-09-20 MED ORDER — SODIUM CHLORIDE 0.9% FLUSH
10.0000 mL | INTRAVENOUS | Status: DC | PRN
  Administered 2020-09-20: 10 mL
  Filled 2020-09-20: qty 10

## 2020-09-20 MED ORDER — SODIUM CHLORIDE 0.9 % IV SOLN
36.0000 mg/m2 | Freq: Once | INTRAVENOUS | Status: AC
  Administered 2020-09-20: 75 mg via INTRAVENOUS
  Filled 2020-09-20: qty 75

## 2020-09-20 NOTE — Patient Instructions (Signed)
[facility] Discharge Instructions for Patients Receiving Chemotherapy  Today you received the following chemotherapy agent: Cisplatin  To help prevent nausea and vomiting after your treatment, we encourage you to take your nausea medication as prescribed.   If you develop nausea and vomiting that is not controlled by your nausea medication, call the clinic.   BELOW ARE SYMPTOMS THAT SHOULD BE REPORTED IMMEDIATELY:  *FEVER GREATER THAN 100.5 F  *CHILLS WITH OR WITHOUT FEVER  NAUSEA AND VOMITING THAT IS NOT CONTROLLED WITH YOUR NAUSEA MEDICATION  *UNUSUAL SHORTNESS OF BREATH  *UNUSUAL BRUISING OR BLEEDING  TENDERNESS IN MOUTH AND THROAT WITH OR WITHOUT PRESENCE OF ULCERS  *URINARY PROBLEMS  *BOWEL PROBLEMS  UNUSUAL RASH Items with * indicate a potential emergency and should be followed up as soon as possible.  Feel free to call the clinic should you have any questions or concerns. The clinic phone number is [phone].  Please show the [facility] at check-in to the Emergency Department and triage nurse.   Cisplatin injection What is this medicine? CISPLATIN (SIS pla tin) is a chemotherapy drug. It targets fast dividing cells, like cancer cells, and causes these cells to die. This medicine is used to treat many types of cancer like bladder, ovarian, and testicular cancers. This medicine may be used for other purposes; ask your health care provider or pharmacist if you have questions. COMMON BRAND NAME(S): Platinol, Platinol -AQ What should I tell my health care provider before I take this medicine? They need to know if you have any of these conditions:  eye disease, vision problems  hearing problems  kidney disease  low blood counts, like white cells, platelets, or red blood cells  tingling of the fingers or toes, or other nerve disorder  an unusual or allergic reaction to cisplatin, carboplatin, oxaliplatin, other medicines, foods, dyes, or  preservatives  pregnant or trying to get pregnant  breast-feeding How should I use this medicine? This drug is given as an infusion into a vein. It is administered in a hospital or clinic by a specially trained health care professional. Talk to your pediatrician regarding the use of this medicine in children. Special care may be needed. Overdosage: If you think you have taken too much of this medicine contact a poison control center or emergency room at once. NOTE: This medicine is only for you. Do not share this medicine with others. What if I miss a dose? It is important not to miss a dose. Call your doctor or health care professional if you are unable to keep an appointment. What may interact with this medicine? This medicine may interact with the following medications:  foscarnet  certain antibiotics like amikacin, gentamicin, neomycin, polymyxin B, streptomycin, tobramycin, vancomycin This list may not describe all possible interactions. Give your health care provider a list of all the medicines, herbs, non-prescription drugs, or dietary supplements you use. Also tell them if you smoke, drink alcohol, or use illegal drugs. Some items may interact with your medicine. What should I watch for while using this medicine? Your condition will be monitored carefully while you are receiving this medicine. You will need important blood work done while you are taking this medicine. This drug may make you feel generally unwell. This is not uncommon, as chemotherapy can affect healthy cells as well as cancer cells. Report any side effects. Continue your course of treatment even though you feel ill unless your doctor tells you to stop. This medicine may increase  risk of getting an infection. Call your healthcare professional for advice if you get a fever, chills, or sore throat, or other symptoms of a cold or flu. Do not treat yourself. Try to avoid being around people who are sick. Avoid taking  medicines that contain aspirin, acetaminophen, ibuprofen, naproxen, or ketoprofen unless instructed by your healthcare professional. These medicines may hide a fever. This medicine may increase your risk to bruise or bleed. Call your doctor or health care professional if you notice any unusual bleeding. Be careful brushing and flossing your teeth or using a toothpick because you may get an infection or bleed more easily. If you have any dental work done, tell your dentist you are receiving this medicine. Do not become pregnant while taking this medicine or for 14 months after stopping it. Women should inform their healthcare professional if they wish to become pregnant or think they might be pregnant. Men should not father a child while taking this medicine and for 11 months after stopping it. There is potential for serious side effects to an unborn child. Talk to your healthcare professional for more information. Do not breast-feed an infant while taking this medicine. This medicine has caused ovarian failure in some women. This medicine may make it more difficult to get pregnant. Talk to your healthcare professional if you are concerned about your fertility. This medicine has caused decreased sperm counts in some men. This may make it more difficult to father a child. Talk to your healthcare professional if you are concerned about your fertility. Drink fluids as directed while you are taking this medicine. This will help protect your kidneys. Call your doctor or health care professional if you get diarrhea. Do not treat yourself. What side effects may I notice from receiving this medicine? Side effects that you should report to your doctor or health care professional as soon as possible:  allergic reactions like skin rash, itching or hives, swelling of the face, lips, or tongue  blurred vision  changes in vision  decreased hearing or ringing of the ears  nausea, vomiting  pain, redness, or  irritation at site where injected  pain, tingling, numbness in the hands or feet  signs and symptoms of bleeding such as bloody or black, tarry stools; red or dark brown urine; spitting up blood or brown material that looks like coffee grounds; red spots on the skin; unusual bruising or bleeding from the eyes, gums, or nose  signs and symptoms of infection like fever; chills; cough; sore throat; pain or trouble passing urine  signs and symptoms of kidney injury like trouble passing urine or change in the amount of urine  signs and symptoms of low red blood cells or anemia such as unusually weak or tired; feeling faint or lightheaded; falls; breathing problems Side effects that usually do not require medical attention (report to your doctor or health care professional if they continue or are bothersome):  loss of appetite  mouth sores  muscle cramps This list may not describe all possible side effects. Call your doctor for medical advice about side effects. You may report side effects to FDA at 1-800-FDA-1088. Where should I keep my medicine? This drug is given in a hospital or clinic and will not be stored at home. NOTE: This sheet is a summary. It may not cover all possible information. If you have questions about this medicine, talk to your doctor, pharmacist, or health care provider.  2021 Elsevier/Gold Standard (2018-08-23 15:59:17)  

## 2020-09-21 ENCOUNTER — Telehealth: Payer: Self-pay | Admitting: *Deleted

## 2020-09-21 NOTE — Telephone Encounter (Signed)
Called pt to see how she did with her new treatment & she reports doing well with no c/o's.  She slept well last eve & took nausea med prophylacticly & has no nausea. She denies any bowel problems. She reports knowing how to reach [place] with any concerns.

## 2020-09-21 NOTE — Telephone Encounter (Signed)
-----   Message from Arman Bogus, RN sent at 09/20/2020  3:00 PM EST ----- Regarding: Alvy Bimler, 1st time cisplatin [place] pt, 1st time cisplatin, tolerated well

## 2020-09-22 ENCOUNTER — Ambulatory Visit
Admission: RE | Admit: 2020-09-22 | Discharge: 2020-09-22 | Disposition: A | Discharge status: Home / Self Care | Payer: Medicare PPO | Source: Ambulatory Visit | Attending: Radiation Oncology | Admitting: Radiation Oncology

## 2020-09-22 ENCOUNTER — Other Ambulatory Visit: Payer: Self-pay

## 2020-09-22 DIAGNOSIS — C539 Malignant neoplasm of cervix uteri, unspecified: Secondary | ICD-10-CM

## 2020-09-22 DIAGNOSIS — Z510 Encounter for antineoplastic radiation therapy: Secondary | ICD-10-CM | POA: Diagnosis not present

## 2020-09-23 ENCOUNTER — Ambulatory Visit
Admission: RE | Admit: 2020-09-23 | Discharge: 2020-09-23 | Disposition: A | Discharge status: Home / Self Care | Payer: Medicare PPO | Source: Ambulatory Visit | Attending: Radiation Oncology | Admitting: Radiation Oncology

## 2020-09-23 ENCOUNTER — Ambulatory Visit: Payer: Medicare PPO | Admitting: Radiation Oncology

## 2020-09-23 ENCOUNTER — Other Ambulatory Visit: Payer: Self-pay

## 2020-09-23 ENCOUNTER — Inpatient Hospital Stay: Payer: Medicare PPO

## 2020-09-23 DIAGNOSIS — C539 Malignant neoplasm of cervix uteri, unspecified: Secondary | ICD-10-CM

## 2020-09-23 DIAGNOSIS — Z510 Encounter for antineoplastic radiation therapy: Secondary | ICD-10-CM | POA: Diagnosis not present

## 2020-09-23 DIAGNOSIS — Z5111 Encounter for antineoplastic chemotherapy: Secondary | ICD-10-CM | POA: Diagnosis not present

## 2020-09-23 LAB — BASIC METABOLIC PANEL - CANCER CENTER ONLY
Anion gap: 10 (ref 5–15)
BUN: 20 mg/dL (ref 8–23)
CO2: 25 mmol/L (ref 22–32)
Calcium: 8.5 mg/dL — ABNORMAL LOW (ref 8.9–10.3)
Chloride: 103 mmol/L (ref 98–111)
Creatinine: 1.18 mg/dL — ABNORMAL HIGH (ref 0.44–1.00)
GFR, Estimated: 50 mL/min — ABNORMAL LOW (ref >60)
Glucose, Bld: 105 mg/dL — ABNORMAL HIGH (ref 70–99)
Potassium: 3.9 mmol/L (ref 3.5–5.1)
Sodium: 138 mmol/L (ref 135–145)

## 2020-09-23 LAB — CBC WITH DIFFERENTIAL (CANCER CENTER ONLY)
Abs Immature Granulocytes: 0.04 10*3/uL (ref 0.00–0.07)
Basophils Absolute: 0 10*3/uL (ref 0.0–0.1)
Basophils Relative: 0 %
Eosinophils Absolute: 0.1 10*3/uL (ref 0.0–0.5)
Eosinophils Relative: 1 %
HCT: 31 % — ABNORMAL LOW (ref 36.0–46.0)
Hemoglobin: 9.8 g/dL — ABNORMAL LOW (ref 12.0–15.0)
Immature Granulocytes: 1 %
Lymphocytes Relative: 27 %
Lymphs Abs: 2.4 10*3/uL (ref 0.7–4.0)
MCH: 27 pg (ref 26.0–34.0)
MCHC: 31.6 g/dL (ref 30.0–36.0)
MCV: 85.4 fL (ref 80.0–100.0)
Monocytes Absolute: 0.5 10*3/uL (ref 0.1–1.0)
Monocytes Relative: 6 %
Neutro Abs: 5.8 10*3/uL (ref 1.7–7.7)
Neutrophils Relative %: 65 %
Platelet Count: 288 10*3/uL (ref 150–400)
RBC: 3.63 MIL/uL — ABNORMAL LOW (ref 3.87–5.11)
RDW: 13.2 % (ref 11.5–15.5)
WBC Count: 8.9 10*3/uL (ref 4.0–10.5)
nRBC: 0 % (ref 0.0–0.2)

## 2020-09-23 LAB — MAGNESIUM: Magnesium: 1.6 mg/dL — ABNORMAL LOW (ref 1.7–2.4)

## 2020-09-23 MED ORDER — HEPARIN SOD (PORK) LOCK FLUSH 100 UNIT/ML IV SOLN
500.0000 [IU] | Freq: Once | INTRAVENOUS | Status: AC
  Administered 2020-09-23: 500 [IU]
  Filled 2020-09-23: qty 5

## 2020-09-23 MED ORDER — SODIUM CHLORIDE 0.9% FLUSH
10.0000 mL | Freq: Once | INTRAVENOUS | Status: AC
Start: 2020-09-23 — End: 2020-09-23
  Administered 2020-09-23: 10 mL
  Filled 2020-09-23: qty 10

## 2020-09-23 NOTE — Patient Instructions (Signed)

## 2020-09-24 ENCOUNTER — Ambulatory Visit
Admission: RE | Admit: 2020-09-24 | Discharge: 2020-09-24 | Disposition: A | Discharge status: Home / Self Care | Payer: Medicare PPO | Source: Ambulatory Visit | Attending: Radiation Oncology | Admitting: Radiation Oncology

## 2020-09-24 ENCOUNTER — Other Ambulatory Visit: Payer: Self-pay | Admitting: Hematology and Oncology

## 2020-09-24 ENCOUNTER — Encounter: Payer: Self-pay | Admitting: Hematology and Oncology

## 2020-09-24 ENCOUNTER — Inpatient Hospital Stay: Payer: Medicare PPO | Admitting: Hematology and Oncology

## 2020-09-24 ENCOUNTER — Other Ambulatory Visit: Payer: Self-pay

## 2020-09-24 DIAGNOSIS — E538 Deficiency of other specified B group vitamins: Secondary | ICD-10-CM

## 2020-09-24 DIAGNOSIS — N183 Chronic kidney disease, stage 3 unspecified: Secondary | ICD-10-CM

## 2020-09-24 DIAGNOSIS — C539 Malignant neoplasm of cervix uteri, unspecified: Secondary | ICD-10-CM

## 2020-09-24 DIAGNOSIS — Z510 Encounter for antineoplastic radiation therapy: Secondary | ICD-10-CM | POA: Diagnosis not present

## 2020-09-24 DIAGNOSIS — Z5111 Encounter for antineoplastic chemotherapy: Secondary | ICD-10-CM | POA: Diagnosis not present

## 2020-09-24 NOTE — Assessment & Plan Note (Signed)
This could be related to dehydration or she might have chronic kidney disease related to diabetes  We discussed the importance of aggressive hydration Since discontinuation of lisinopril, her blood pressure is intermittently elevated Observe closely for now

## 2020-09-24 NOTE — Progress Notes (Signed)
[facility] OFFICE PROGRESS NOTE  Patient Care Team: Alvester Chou, NP as PCP - General (Nurse Practitioner) Jacqulyn Liner, RN as Oncology Nurse Navigator (Oncology)  ASSESSMENT & PLAN:  Cervical cancer ([place]) Overall, she tolerated treatment very well except for persistent anemia Her magnesium level has improved She has no side effects such as nausea or constipation No peripheral neuropathy I will continue treatment as scheduled and I will see her on a weekly basis for further follow-up  Vitamin B12 deficiency She was recently found to have severe vitamin B12 deficiency She will continue vitamin B12 injection weekly  Hypomagnesemia Her magnesium level has improved She will continue low-dose magnesium supplement  Chronic kidney disease (CKD), stage III (moderate) ([place]) This could be related to dehydration or she might have chronic kidney disease related to diabetes  We discussed the importance of aggressive hydration Since discontinuation of lisinopril, her blood pressure is intermittently elevated Observe closely for now   No orders of the defined types were placed in this encounter.   All questions were answered. The patient knows to call the clinic with any problems, questions or concerns. The total time spent in the appointment was 20 minutes encounter with patients including review of chart and various tests results, discussions about plan of care and coordination of care plan   Heath Lark, MD 09/24/2020 1:09 PM  INTERVAL HISTORY: Please see below for problem oriented charting. She returns for her weekly follow-up She tolerated chemotherapy and radiation well She continues to have mild intermittent bleeding but not a significant amount No recent nausea or changes in bowel habits No peripheral neuropathy  SUMMARY OF ONCOLOGIC HISTORY: Oncology History  Cervical cancer ([place])  08/13/2020 Pathology Results   Cervical biopsy showed poorly differentiated  carcinoma. 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   08/27/2020 Imaging   MRI pelvis Large cervical mass with pelvic adenopathy, at least T2B N1 disease. Question of involvement of posterior urinary bladder (T4) for which additional images have been requested. Patient will return for additional images to answer this question. (Thinner section axial T2 without fat sat and sagittal T2 weighted imaging also without fat saturation.)   Small field-of-view images show that the parametrial extension comes in very close proximity to the RIGHT ureter.   08/30/2020 Initial Diagnosis   Cervical cancer ([place])   08/30/2020 Cancer Staging   Staging form: Cervix Uteri, AJCC Version 9 - Clinical stage from 08/30/2020: Stage IIIC1 (cT2, cN1, cM0) - Signed by Heath Lark, MD on 08/30/2020   09/01/2020 PET scan   1. Large cervical mass extending into the endometrial canal to the level of the uterine fundus, associated with pelvic adenopathy to the level of the RIGHT common iliac chain. Please refer to MRI for further detail. 2. No signs of solid organ uptake or metastatic disease to the chest. 3. Subcutaneous nodule with marked increased metabolic activity, correlation with direct clinical inspection is suggested to exclude cutaneous neoplasm or subcutaneous nodule in this location. Complicated sebaceous cyst could also potentially have this appearance. 4. Uptake in the anal canal could likely physiologic. Given the intense nature of the uptake would suggest correlation with direct clinical inspection/examination. 5. Uptake in axillary lymph nodes with activity less than mediastinal blood pool likely small reactive lymph nodes, attention on follow-up. Morphologic features also with benign appearance.   09/16/2020 Procedure   Successful placement of a power injectable Port-A-Cath via the right internal jugular vein. The catheter is ready for  immediate use.     09/20/2020 -   Chemotherapy    Patient is on Treatment Plan: CERVICAL CISPLATIN Q7D      Malignant neoplasm of cervix ([place])  09/01/2020 Initial Diagnosis   Malignant neoplasm of cervix ([place])   09/20/2020 -  Chemotherapy    Patient is on Treatment Plan: CERVICAL CISPLATIN Q7D        REVIEW OF SYSTEMS:   Constitutional: Denies fevers, chills or abnormal weight loss Eyes: Denies blurriness of vision Ears, nose, mouth, throat, and face: Denies mucositis or sore throat Respiratory: Denies cough, dyspnea or wheezes Cardiovascular: Denies palpitation, chest discomfort or lower extremity swelling Gastrointestinal:  Denies nausea, heartburn or change in bowel habits Skin: Denies abnormal skin rashes Lymphatics: Denies new lymphadenopathy or easy bruising Neurological:Denies numbness, tingling or new weaknesses Behavioral/Psych: Mood is stable, no new changes  All other systems were reviewed with the patient and are negative.  I have reviewed the past medical history, past surgical history, social history and family history with the patient and they are unchanged from previous note.  ALLERGIES:  has No Known Allergies.  MEDICATIONS:  Current Outpatient Medications  Medication Sig Dispense Refill  . ezetimibe (ZETIA) 10 MG tablet Take 10 mg by mouth daily.    Marland Kitchen lidocaine-prilocaine (EMLA) cream Apply to affected area once 30 g 3  . magnesium oxide (MAG-OX) 400 (241.3 Mg) MG tablet Take 1 tablet (400 mg total) by mouth daily. 30 tablet 11  . metFORMIN (GLUCOPHAGE) 1000 MG tablet Take 1,000 mg by mouth 2 (two) times daily with a meal.    . ondansetron (ZOFRAN) 8 MG tablet Take 1 tablet (8 mg total) by mouth every 8 (eight) hours as needed. Start on the third day after cisplatin chemotherapy. 30 tablet 1  . prochlorperazine (COMPAZINE) 10 MG tablet Take 1 tablet (10 mg total) by mouth every 6 (six) hours as needed (Nausea or vomiting). 30 tablet 1  . rosuvastatin (CRESTOR) 40 MG tablet Take 40 mg by  mouth daily.    . sitaGLIPtin (JANUVIA) 50 MG tablet Take 50 mg by mouth 2 (two) times daily.     No current facility-administered medications for this visit.    PHYSICAL EXAMINATION: ECOG PERFORMANCE STATUS: 1 - Symptomatic but completely ambulatory  Vitals:   09/24/20 1237  BP: (!) 153/66  Pulse: (!) 105  Resp: 18  Temp: 98.2 F (36.8 C)  SpO2: 100%   Filed Weights   09/24/20 1237  Weight: 215 lb (97.5 kg)    GENERAL:alert, no distress and comfortable NEURO: alert & oriented x 3 with fluent speech, no focal motor/sensory deficits  LABORATORY DATA:  I have reviewed the data as listed    Component Value Date/Time   NA 138 09/23/2020 1052   K 3.9 09/23/2020 1052   CL 103 09/23/2020 1052   CO2 25 09/23/2020 1052   GLUCOSE 105 (H) 09/23/2020 1052   BUN 20 09/23/2020 1052   CREATININE 1.18 (H) 09/23/2020 1052   CALCIUM 8.5 (L) 09/23/2020 1052   GFRNONAA 50 (L) 09/23/2020 1052    No results found for: SPEP, UPEP  Lab Results  Component Value Date   WBC 8.9 09/23/2020   NEUTROABS 5.8 09/23/2020   HGB 9.8 (L) 09/23/2020   HCT 31.0 (L) 09/23/2020   MCV 85.4 09/23/2020   PLT 288 09/23/2020      Chemistry      Component Value Date/Time   NA 138 09/23/2020 1052   K 3.9 09/23/2020 1052  CL 103 09/23/2020 1052   CO2 25 09/23/2020 1052   BUN 20 09/23/2020 1052   CREATININE 1.18 (H) 09/23/2020 1052      Component Value Date/Time   CALCIUM 8.5 (L) 09/23/2020 1052

## 2020-09-24 NOTE — Assessment & Plan Note (Signed)
Overall, she tolerated treatment very well except for persistent anemia Her magnesium level has improved She has no side effects such as nausea or constipation No peripheral neuropathy I will continue treatment as scheduled and I will see her on a weekly basis for further follow-up

## 2020-09-24 NOTE — Assessment & Plan Note (Signed)
She was recently found to have severe vitamin B12 deficiency She will continue vitamin B12 injection weekly

## 2020-09-24 NOTE — Assessment & Plan Note (Signed)
Her magnesium level has improved She will continue low-dose magnesium supplement

## 2020-09-25 ENCOUNTER — Ambulatory Visit: Payer: Medicare PPO

## 2020-09-27 ENCOUNTER — Inpatient Hospital Stay: Payer: Medicare PPO

## 2020-09-27 ENCOUNTER — Ambulatory Visit
Admission: RE | Admit: 2020-09-27 | Discharge: 2020-09-27 | Disposition: A | Discharge status: Home / Self Care | Payer: Medicare PPO | Source: Ambulatory Visit | Attending: Radiation Oncology | Admitting: Radiation Oncology

## 2020-09-27 ENCOUNTER — Other Ambulatory Visit: Payer: Self-pay

## 2020-09-27 VITALS — BP 164/89 | HR 98 | Temp 98.5°F | Resp 18 | Ht 62.0 in | Wt 214.0 lb

## 2020-09-27 DIAGNOSIS — Z5111 Encounter for antineoplastic chemotherapy: Secondary | ICD-10-CM | POA: Diagnosis not present

## 2020-09-27 DIAGNOSIS — C539 Malignant neoplasm of cervix uteri, unspecified: Secondary | ICD-10-CM

## 2020-09-27 DIAGNOSIS — Z510 Encounter for antineoplastic radiation therapy: Secondary | ICD-10-CM | POA: Diagnosis not present

## 2020-09-27 MED ORDER — SODIUM CHLORIDE 0.9 % IV SOLN
Freq: Once | INTRAVENOUS | Status: AC
  Filled 2020-09-27: qty 250

## 2020-09-27 MED ORDER — PALONOSETRON HCL INJECTION 0.25 MG/5ML
0.2500 mg | Freq: Once | INTRAVENOUS | Status: AC
Start: 2020-09-27 — End: 2020-09-27
  Administered 2020-09-27: 0.25 mg via INTRAVENOUS

## 2020-09-27 MED ORDER — SODIUM CHLORIDE 0.9 % IV SOLN
36.0000 mg/m2 | Freq: Once | INTRAVENOUS | Status: AC
  Administered 2020-09-27: 74 mg via INTRAVENOUS
  Filled 2020-09-27: qty 74

## 2020-09-27 MED ORDER — SODIUM CHLORIDE 0.9 % IV SOLN
150.0000 mg | Freq: Once | INTRAVENOUS | Status: AC
  Administered 2020-09-27: 150 mg via INTRAVENOUS
  Filled 2020-09-27: qty 150

## 2020-09-27 MED ORDER — HEPARIN SOD (PORK) LOCK FLUSH 100 UNIT/ML IV SOLN
500.0000 [IU] | Freq: Once | INTRAVENOUS | Status: AC | PRN
Start: 2020-09-27 — End: 2020-09-27
  Administered 2020-09-27: 500 [IU]
  Filled 2020-09-27: qty 5

## 2020-09-27 MED ORDER — SODIUM CHLORIDE 0.9% FLUSH
10.0000 mL | INTRAVENOUS | Status: DC | PRN
  Administered 2020-09-27: 10 mL
  Filled 2020-09-27: qty 10

## 2020-09-27 MED ORDER — CYANOCOBALAMIN 1000 MCG/ML IJ SOLN
1000.0000 ug | Freq: Once | INTRAMUSCULAR | Status: AC
  Administered 2020-09-27: 1000 ug via INTRAMUSCULAR

## 2020-09-27 MED ORDER — SODIUM CHLORIDE 0.9 % IV SOLN
10.0000 mg | Freq: Once | INTRAVENOUS | Status: AC
  Administered 2020-09-27: 10 mg via INTRAVENOUS
  Filled 2020-09-27: qty 10

## 2020-09-27 NOTE — Patient Instructions (Signed)
[facility] Discharge Instructions for Patients Receiving Chemotherapy  Today you received the following chemotherapy agents: Cisplatin  To help prevent nausea and vomiting after your treatment, we encourage you to take your nausea medication  as prescribed.    If you develop nausea and vomiting that is not controlled by your nausea medication, call the clinic.   BELOW ARE SYMPTOMS THAT SHOULD BE REPORTED IMMEDIATELY:  *FEVER GREATER THAN 100.5 F  *CHILLS WITH OR WITHOUT FEVER  NAUSEA AND VOMITING THAT IS NOT CONTROLLED WITH YOUR NAUSEA MEDICATION  *UNUSUAL SHORTNESS OF BREATH  *UNUSUAL BRUISING OR BLEEDING  TENDERNESS IN MOUTH AND THROAT WITH OR WITHOUT PRESENCE OF ULCERS  *URINARY PROBLEMS  *BOWEL PROBLEMS  UNUSUAL RASH Items with * indicate a potential emergency and should be followed up as soon as possible.  Feel free to call the clinic should you have any questions or concerns. The clinic phone number is [phone].  Please show the CHEMO ALERT CARD at check-in to the Emergency Department and triage nurse.   

## 2020-09-28 ENCOUNTER — Ambulatory Visit
Admission: RE | Admit: 2020-09-28 | Discharge: 2020-09-28 | Disposition: A | Discharge status: Home / Self Care | Payer: Medicare PPO | Source: Ambulatory Visit | Attending: Radiation Oncology | Admitting: Radiation Oncology

## 2020-09-28 DIAGNOSIS — Z510 Encounter for antineoplastic radiation therapy: Secondary | ICD-10-CM | POA: Diagnosis not present

## 2020-09-29 ENCOUNTER — Ambulatory Visit
Admission: RE | Admit: 2020-09-29 | Discharge: 2020-09-29 | Disposition: A | Discharge status: Home / Self Care | Payer: Medicare PPO | Source: Ambulatory Visit | Attending: Radiation Oncology | Admitting: Radiation Oncology

## 2020-09-29 ENCOUNTER — Other Ambulatory Visit: Payer: Self-pay

## 2020-09-29 DIAGNOSIS — Z510 Encounter for antineoplastic radiation therapy: Secondary | ICD-10-CM | POA: Diagnosis not present

## 2020-09-30 ENCOUNTER — Ambulatory Visit
Admission: RE | Admit: 2020-09-30 | Discharge: 2020-09-30 | Disposition: A | Discharge status: Home / Self Care | Payer: Medicare PPO | Source: Ambulatory Visit | Attending: Radiation Oncology | Admitting: Radiation Oncology

## 2020-09-30 ENCOUNTER — Inpatient Hospital Stay: Payer: Medicare PPO

## 2020-09-30 ENCOUNTER — Other Ambulatory Visit: Payer: Self-pay

## 2020-09-30 DIAGNOSIS — Z510 Encounter for antineoplastic radiation therapy: Secondary | ICD-10-CM | POA: Diagnosis not present

## 2020-09-30 DIAGNOSIS — C539 Malignant neoplasm of cervix uteri, unspecified: Secondary | ICD-10-CM

## 2020-09-30 DIAGNOSIS — Z5111 Encounter for antineoplastic chemotherapy: Secondary | ICD-10-CM | POA: Diagnosis not present

## 2020-09-30 LAB — CBC WITH DIFFERENTIAL (CANCER CENTER ONLY)
Abs Immature Granulocytes: 0.02 10*3/uL (ref 0.00–0.07)
Basophils Absolute: 0 10*3/uL (ref 0.0–0.1)
Basophils Relative: 0 %
Eosinophils Absolute: 0.1 10*3/uL (ref 0.0–0.5)
Eosinophils Relative: 1 %
HCT: 28.1 % — ABNORMAL LOW (ref 36.0–46.0)
Hemoglobin: 9.2 g/dL — ABNORMAL LOW (ref 12.0–15.0)
Immature Granulocytes: 0 %
Lymphocytes Relative: 20 %
Lymphs Abs: 1 10*3/uL (ref 0.7–4.0)
MCH: 27.5 pg (ref 26.0–34.0)
MCHC: 32.7 g/dL (ref 30.0–36.0)
MCV: 84.1 fL (ref 80.0–100.0)
Monocytes Absolute: 0.4 10*3/uL (ref 0.1–1.0)
Monocytes Relative: 9 %
Neutro Abs: 3.4 10*3/uL (ref 1.7–7.7)
Neutrophils Relative %: 70 %
Platelet Count: 220 10*3/uL (ref 150–400)
RBC: 3.34 MIL/uL — ABNORMAL LOW (ref 3.87–5.11)
RDW: 13.2 % (ref 11.5–15.5)
WBC Count: 4.9 10*3/uL (ref 4.0–10.5)
nRBC: 0 % (ref 0.0–0.2)

## 2020-09-30 LAB — BASIC METABOLIC PANEL - CANCER CENTER ONLY
Anion gap: 10 (ref 5–15)
BUN: 22 mg/dL (ref 8–23)
CO2: 26 mmol/L (ref 22–32)
Calcium: 9 mg/dL (ref 8.9–10.3)
Chloride: 102 mmol/L (ref 98–111)
Creatinine: 1.07 mg/dL — ABNORMAL HIGH (ref 0.44–1.00)
GFR, Estimated: 57 mL/min — ABNORMAL LOW (ref >60)
Glucose, Bld: 100 mg/dL — ABNORMAL HIGH (ref 70–99)
Potassium: 3.7 mmol/L (ref 3.5–5.1)
Sodium: 138 mmol/L (ref 135–145)

## 2020-09-30 LAB — MAGNESIUM: Magnesium: 1.3 mg/dL — ABNORMAL LOW (ref 1.7–2.4)

## 2020-09-30 MED ORDER — SODIUM CHLORIDE 0.9% FLUSH
10.0000 mL | Freq: Once | INTRAVENOUS | Status: AC
  Administered 2020-09-30: 10 mL
  Filled 2020-09-30: qty 10

## 2020-09-30 MED ORDER — HEPARIN SOD (PORK) LOCK FLUSH 100 UNIT/ML IV SOLN
500.0000 [IU] | Freq: Once | INTRAVENOUS | Status: AC
  Administered 2020-09-30: 500 [IU]
  Filled 2020-09-30: qty 5

## 2020-10-01 ENCOUNTER — Inpatient Hospital Stay: Payer: Medicare PPO | Admitting: Hematology and Oncology

## 2020-10-01 ENCOUNTER — Ambulatory Visit
Admission: RE | Admit: 2020-10-01 | Discharge: 2020-10-01 | Disposition: A | Discharge status: Home / Self Care | Payer: Medicare PPO | Source: Ambulatory Visit | Attending: Radiation Oncology | Admitting: Radiation Oncology

## 2020-10-01 ENCOUNTER — Other Ambulatory Visit: Payer: Self-pay | Admitting: Hematology and Oncology

## 2020-10-01 ENCOUNTER — Encounter: Payer: Self-pay | Admitting: Hematology and Oncology

## 2020-10-01 DIAGNOSIS — Z5111 Encounter for antineoplastic chemotherapy: Secondary | ICD-10-CM | POA: Diagnosis not present

## 2020-10-01 DIAGNOSIS — Z510 Encounter for antineoplastic radiation therapy: Secondary | ICD-10-CM | POA: Diagnosis not present

## 2020-10-01 DIAGNOSIS — C539 Malignant neoplasm of cervix uteri, unspecified: Secondary | ICD-10-CM

## 2020-10-01 DIAGNOSIS — I10 Essential (primary) hypertension: Secondary | ICD-10-CM | POA: Diagnosis not present

## 2020-10-01 DIAGNOSIS — N183 Chronic kidney disease, stage 3 unspecified: Secondary | ICD-10-CM

## 2020-10-01 DIAGNOSIS — E538 Deficiency of other specified B group vitamins: Secondary | ICD-10-CM

## 2020-10-01 MED ORDER — AMLODIPINE BESYLATE 10 MG PO TABS: 10.0000 mg | ORAL_TABLET | Freq: Every day | ORAL | 11 refills | Status: DC

## 2020-10-01 MED ORDER — MAGNESIUM OXIDE 400 (241.3 MG) MG PO TABS: 400.0000 mg | ORAL_TABLET | Freq: Two times a day (BID) | ORAL | 11 refills | Status: DC

## 2020-10-01 NOTE — Assessment & Plan Note (Signed)
I recommend increasing the dose of magnesium to 2 tablets daily She is instructed to watch out for diarrhea

## 2020-10-01 NOTE — Assessment & Plan Note (Signed)
This could be related to dehydration or she might have chronic kidney disease related to diabetes  We discussed the importance of aggressive hydration Since discontinuation of lisinopril, her blood pressure is intermittently elevated We discussed the importance of aggressive blood pressure monitoring and management

## 2020-10-01 NOTE — Assessment & Plan Note (Signed)
Since discontinuation of lisinopril, her blood pressure is elevated Recommend substituting with amlodipine

## 2020-10-01 NOTE — Progress Notes (Signed)
[place] OFFICE PROGRESS NOTE  Patient Care Team: Alvester Chou, NP as PCP - General (Nurse Practitioner) Jacqulyn Liner, RN as Oncology Nurse Navigator (Oncology)  ASSESSMENT & PLAN:  Cervical cancer ([place]) Overall, she tolerated treatment very well except for persistent anemia Her magnesium level has improved She has no side effects such as nausea or constipation No peripheral neuropathy I will continue treatment as scheduled and I will see her on a weekly basis for further follow-up  Chronic kidney disease (CKD), stage III (moderate) ([place]) This could be related to dehydration or she might have chronic kidney disease related to diabetes  We discussed the importance of aggressive hydration Since discontinuation of lisinopril, her blood pressure is intermittently elevated We discussed the importance of aggressive blood pressure monitoring and management  Hypomagnesemia I recommend increasing the dose of magnesium to 2 tablets daily She is instructed to watch out for diarrhea  Essential hypertension Since discontinuation of lisinopril, her blood pressure is elevated Recommend substituting with amlodipine  Vitamin B12 deficiency She will continue vitamin B12 injection We discussed the risk and benefits of IV iron If it does not improve by next week, we will add IV iron in the future   No orders of the defined types were placed in this encounter.   All questions were answered. The patient knows to call the clinic with any problems, questions or concerns. The total time spent in the appointment was 30 minutes encounter with patients including review of chart and various tests results, discussions about plan of care and coordination of care plan   Heath Lark, MD 10/01/2020 5:30 PM  INTERVAL HISTORY: Please see below for problem oriented charting. She returns for further follow-up She tolerated recent treatment well She has some loose stool Her blood pressure  is noted to be persistently elevated even at home She denies nausea No peripheral neuropathy Her energy level is fair She denies recent excessive bleeding Pain control is excellent  SUMMARY OF ONCOLOGIC HISTORY: Oncology History  Cervical cancer ([place])  08/13/2020 Pathology Results   Cervical biopsy showed poorly differentiated carcinoma. 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   08/27/2020 Imaging   MRI pelvis Large cervical mass with pelvic adenopathy, at least T2B N1 disease. Question of involvement of posterior urinary bladder (T4) for which additional images have been requested. Patient will return for additional images to answer this question. (Thinner section axial T2 without fat sat and sagittal T2 weighted imaging also without fat saturation.)   Small field-of-view images show that the parametrial extension comes in very close proximity to the RIGHT ureter.   08/30/2020 Initial Diagnosis   Cervical cancer ([place])   08/30/2020 Cancer Staging   Staging form: Cervix Uteri, AJCC Version 9 - Clinical stage from 08/30/2020: Stage IIIC1 (cT2, cN1, cM0) - Signed by Heath Lark, MD on 08/30/2020   09/01/2020 PET scan   1. Large cervical mass extending into the endometrial canal to the level of the uterine fundus, associated with pelvic adenopathy to the level of the RIGHT common iliac chain. Please refer to MRI for further detail. 2. No signs of solid organ uptake or metastatic disease to the chest. 3. Subcutaneous nodule with marked increased metabolic activity, correlation with direct clinical inspection is suggested to exclude cutaneous neoplasm or subcutaneous nodule in this location. Complicated sebaceous cyst could also potentially have this appearance. 4. Uptake in the anal canal could likely physiologic. Given the intense nature of the uptake would  suggest correlation with direct clinical inspection/examination. 5. Uptake in axillary lymph nodes  with activity less than mediastinal blood pool likely small reactive lymph nodes, attention on follow-up. Morphologic features also with benign appearance.   09/16/2020 Procedure   Successful placement of a power injectable Port-A-Cath via the right internal jugular vein. The catheter is ready for immediate use.     09/20/2020 -  Chemotherapy    Patient is on Treatment Plan: CERVICAL CISPLATIN Q7D      Malignant neoplasm of cervix ([place])  09/01/2020 Initial Diagnosis   Malignant neoplasm of cervix ([place])   09/20/2020 -  Chemotherapy    Patient is on Treatment Plan: CERVICAL CISPLATIN Q7D        REVIEW OF SYSTEMS:   Constitutional: Denies fevers, chills or abnormal weight loss Eyes: Denies blurriness of vision Ears, nose, mouth, throat, and face: Denies mucositis or sore throat Respiratory: Denies cough, dyspnea or wheezes Cardiovascular: Denies palpitation, chest discomfort or lower extremity swelling Gastrointestinal:  Denies nausea, heartburn or change in bowel habits Skin: Denies abnormal skin rashes Lymphatics: Denies new lymphadenopathy or easy bruising Neurological:Denies numbness, tingling or new weaknesses Behavioral/Psych: Mood is stable, no new changes  All other systems were reviewed with the patient and are negative.  I have reviewed the past medical history, past surgical history, social history and family history with the patient and they are unchanged from previous note.  ALLERGIES:  has No Known Allergies.  MEDICATIONS:  Current Outpatient Medications  Medication Sig Dispense Refill  . amLODipine (NORVASC) 10 MG tablet Take 1 tablet (10 mg total) by mouth daily. 30 tablet 11  . ezetimibe (ZETIA) 10 MG tablet Take 10 mg by mouth daily.    Marland Kitchen lidocaine-prilocaine (EMLA) cream Apply to affected area once 30 g 3  . magnesium oxide (MAG-OX) 400 (241.3 Mg) MG tablet Take 1 tablet (400 mg total) by mouth 2 (two) times daily. 60 tablet 11  . metFORMIN (GLUCOPHAGE) 1000  MG tablet Take 1,000 mg by mouth 2 (two) times daily with a meal.    . ondansetron (ZOFRAN) 8 MG tablet Take 1 tablet (8 mg total) by mouth every 8 (eight) hours as needed. Start on the third day after cisplatin chemotherapy. 30 tablet 1  . prochlorperazine (COMPAZINE) 10 MG tablet Take 1 tablet (10 mg total) by mouth every 6 (six) hours as needed (Nausea or vomiting). 30 tablet 1  . rosuvastatin (CRESTOR) 40 MG tablet Take 40 mg by mouth daily.    . sitaGLIPtin (JANUVIA) 50 MG tablet Take 50 mg by mouth 2 (two) times daily.     No current facility-administered medications for this visit.    PHYSICAL EXAMINATION: ECOG PERFORMANCE STATUS: 1 - Symptomatic but completely ambulatory  Vitals:   10/01/20 1229  BP: (!) 176/89  Pulse: (!) 103  Resp: 18  Temp: 98.1 F (36.7 C)  SpO2: 99%   Filed Weights   10/01/20 1229  Weight: 213 lb (96.6 kg)    GENERAL:alert, no distress and comfortable Musculoskeletal:no cyanosis of digits and no clubbing  NEURO: alert & oriented x 3 with fluent speech, no focal motor/sensory deficits  LABORATORY DATA:  I have reviewed the data as listed    Component Value Date/Time   NA 138 09/30/2020 1039   K 3.7 09/30/2020 1039   CL 102 09/30/2020 1039   CO2 26 09/30/2020 1039   GLUCOSE 100 (H) 09/30/2020 1039   BUN 22 09/30/2020 1039   CREATININE 1.07 (H) 09/30/2020 1039  CALCIUM 9.0 09/30/2020 1039   GFRNONAA 57 (L) 09/30/2020 1039    No results found for: SPEP, UPEP  Lab Results  Component Value Date   WBC 4.9 09/30/2020   NEUTROABS 3.4 09/30/2020   HGB 9.2 (L) 09/30/2020   HCT 28.1 (L) 09/30/2020   MCV 84.1 09/30/2020   PLT 220 09/30/2020      Chemistry      Component Value Date/Time   NA 138 09/30/2020 1039   K 3.7 09/30/2020 1039   CL 102 09/30/2020 1039   CO2 26 09/30/2020 1039   BUN 22 09/30/2020 1039   CREATININE 1.07 (H) 09/30/2020 1039      Component Value Date/Time   CALCIUM 9.0 09/30/2020 1039       RADIOGRAPHIC  STUDIES: I have personally reviewed the radiological images as listed and agreed with the findings in the report. IR IMAGING GUIDED PORT INSERTION  Result Date: 09/16/2020 INDICATION: 70 year old female with history of cervical cancer requiring central venous access for chemotherapy. EXAM: IMPLANTED PORT A CATH PLACEMENT WITH ULTRASOUND AND FLUOROSCOPIC GUIDANCE COMPARISON:  None. MEDICATIONS: Ancef 2 gm IV; The antibiotic was administered within an appropriate time interval prior to skin puncture. ANESTHESIA/SEDATION: Moderate (conscious) sedation was employed during this procedure. A total of Versed 3 mg and Fentanyl 100 mcg was administered intravenously. Moderate Sedation Time: 14 minutes. The patient's level of consciousness and vital signs were monitored continuously by radiology nursing throughout the procedure under my direct supervision. CONTRAST:  None FLUOROSCOPY TIME:  0 minutes, 6 seconds (3 mGy) COMPLICATIONS: None immediate. PROCEDURE: The procedure, risks, benefits, and alternatives were explained to the patient. Questions regarding the procedure were encouraged and answered. The patient understands and consents to the procedure. The right neck and chest were prepped with chlorhexidine in a sterile fashion, and a sterile drape was applied covering the operative field. Maximum barrier sterile technique with sterile gowns and gloves were used for the procedure. A timeout was performed prior to the initiation of the procedure. Ultrasound was used to examine the jugular vein which was compressible and free of internal echoes. A skin marker was used to demarcate the planned venotomy and port pocket incision sites. Local anesthesia was provided to these sites and the subcutaneous tunnel track with 1% lidocaine with 1:100,000 epinephrine. A small incision was created at the jugular access site and blunt dissection was performed of the subcutaneous tissues. Under real time ultrasound guidance, the jugular  vein was accessed with a 21 ga micropuncture needle and an 0.018" wire was inserted to the superior vena cava. A 5 Fr micopuncture set was then used, through which a 0.035" Rosen wire was passed under fluoroscopic guidance into the inferior vena cava. An 8 Fr dilator was then placed over the wire. A subcutaneous port pocket was then created along the upper chest wall utilizing a combination of sharp and blunt dissection. The pocket was irrigated with sterile saline, packed with gauze, and observed for hemorrhage. A single lumen "standard sized" power injectable port was chosen for placement. The 8 Fr catheter was tunneled from the port pocket site to the venotomy incision. The port was placed in the pocket. The external catheter was trimmed to appropriate length. The dilator was exchanged for an 8 Fr peel-away sheath under fluoroscopic guidance. The catheter was then placed through the sheath and the sheath was removed. Final catheter positioning was confirmed and documented with a fluoroscopic spot radiograph. The port was accessed with a Huber needle, aspirated, and flushed with  heparinized saline. The deep dermal layer of the port pocket incision was closed with interrupted 3-0 Vicryl suture. Dermabond was then placed over the port pocket and neck incisions. The patient tolerated the procedure well without immediate post procedural complication. FINDINGS: After catheter placement, the tip lies within the cavoatrial junction. The catheter aspirates and flushes normally and is ready for immediate use. IMPRESSION: Successful placement of a power injectable Port-A-Cath via the right internal jugular vein. The catheter is ready for immediate use. Ruthann Cancer, MD [facility] Electronically Signed   By: Ruthann Cancer MD   On: 09/16/2020 14:59

## 2020-10-01 NOTE — Assessment & Plan Note (Signed)
Overall, she tolerated treatment very well except for persistent anemia Her magnesium level has improved She has no side effects such as nausea or constipation No peripheral neuropathy I will continue treatment as scheduled and I will see her on a weekly basis for further follow-up 

## 2020-10-01 NOTE — Assessment & Plan Note (Signed)
She will continue vitamin B12 injection We discussed the risk and benefits of IV iron If it does not improve by next week, we will add IV iron in the future

## 2020-10-04 ENCOUNTER — Ambulatory Visit
Admission: RE | Admit: 2020-10-04 | Discharge: 2020-10-04 | Disposition: A | Discharge status: Home / Self Care | Payer: Medicare PPO | Source: Ambulatory Visit | Attending: Radiation Oncology | Admitting: Radiation Oncology

## 2020-10-04 ENCOUNTER — Inpatient Hospital Stay: Payer: Medicare PPO

## 2020-10-04 ENCOUNTER — Other Ambulatory Visit: Payer: Self-pay

## 2020-10-04 VITALS — BP 142/71 | HR 94 | Temp 98.8°F | Resp 18

## 2020-10-04 DIAGNOSIS — Z5111 Encounter for antineoplastic chemotherapy: Secondary | ICD-10-CM | POA: Diagnosis not present

## 2020-10-04 DIAGNOSIS — Z510 Encounter for antineoplastic radiation therapy: Secondary | ICD-10-CM | POA: Diagnosis not present

## 2020-10-04 DIAGNOSIS — C539 Malignant neoplasm of cervix uteri, unspecified: Secondary | ICD-10-CM

## 2020-10-04 MED ORDER — HEPARIN SOD (PORK) LOCK FLUSH 100 UNIT/ML IV SOLN
500.0000 [IU] | Freq: Once | INTRAVENOUS | Status: AC | PRN
  Administered 2020-10-04: 500 [IU]
  Filled 2020-10-04: qty 5

## 2020-10-04 MED ORDER — SODIUM CHLORIDE 0.9 % IV SOLN
36.0000 mg/m2 | Freq: Once | INTRAVENOUS | Status: AC
  Administered 2020-10-04: 74 mg via INTRAVENOUS
  Filled 2020-10-04: qty 74

## 2020-10-04 MED ORDER — MAGNESIUM SULFATE 2 GM/50ML IV SOLN
INTRAVENOUS | Status: AC
  Filled 2020-10-04: qty 50

## 2020-10-04 MED ORDER — SODIUM CHLORIDE 0.9 % IV SOLN
150.0000 mg | Freq: Once | INTRAVENOUS | Status: AC
  Administered 2020-10-04: 150 mg via INTRAVENOUS
  Filled 2020-10-04: qty 150

## 2020-10-04 MED ORDER — SODIUM CHLORIDE 0.9% FLUSH
10.0000 mL | INTRAVENOUS | Status: DC | PRN
  Administered 2020-10-04: 10 mL
  Filled 2020-10-04: qty 10

## 2020-10-04 MED ORDER — CYANOCOBALAMIN 1000 MCG/ML IJ SOLN
INTRAMUSCULAR | Status: AC
  Filled 2020-10-04: qty 1

## 2020-10-04 MED ORDER — POTASSIUM CHLORIDE IN NACL 20-0.9 MEQ/L-% IV SOLN
Freq: Once | INTRAVENOUS | Status: AC
  Filled 2020-10-04: qty 1000

## 2020-10-04 MED ORDER — SODIUM CHLORIDE 0.9 % IV SOLN
Freq: Once | INTRAVENOUS | Status: AC
  Filled 2020-10-04: qty 250

## 2020-10-04 MED ORDER — MAGNESIUM SULFATE 2 GM/50ML IV SOLN
2.0000 g | Freq: Once | INTRAVENOUS | Status: AC
  Administered 2020-10-04: 2 g via INTRAVENOUS

## 2020-10-04 MED ORDER — SODIUM CHLORIDE 0.9 % IV SOLN
10.0000 mg | Freq: Once | INTRAVENOUS | Status: AC
  Administered 2020-10-04: 10 mg via INTRAVENOUS
  Filled 2020-10-04: qty 10

## 2020-10-04 MED ORDER — CYANOCOBALAMIN 1000 MCG/ML IJ SOLN
1000.0000 ug | Freq: Once | INTRAMUSCULAR | Status: AC
  Administered 2020-10-04: 1000 ug via INTRAMUSCULAR

## 2020-10-04 MED ORDER — PALONOSETRON HCL INJECTION 0.25 MG/5ML
0.2500 mg | Freq: Once | INTRAVENOUS | Status: AC
  Administered 2020-10-04: 0.25 mg via INTRAVENOUS

## 2020-10-04 MED ORDER — PALONOSETRON HCL INJECTION 0.25 MG/5ML
INTRAVENOUS | Status: AC
  Filled 2020-10-04: qty 5

## 2020-10-04 NOTE — Patient Instructions (Signed)
[facility] Discharge Instructions for Patients Receiving Chemotherapy  Today you received the following chemotherapy agents Cisplatin  To help prevent nausea and vomiting after your treatment, we encourage you to take your nausea medication as directed  If you develop nausea and vomiting that is not controlled by your nausea medication, call the clinic.   BELOW ARE SYMPTOMS THAT SHOULD BE REPORTED IMMEDIATELY:  *FEVER GREATER THAN 100.5 F  *CHILLS WITH OR WITHOUT FEVER  NAUSEA AND VOMITING THAT IS NOT CONTROLLED WITH YOUR NAUSEA MEDICATION  *UNUSUAL SHORTNESS OF BREATH  *UNUSUAL BRUISING OR BLEEDING  TENDERNESS IN MOUTH AND THROAT WITH OR WITHOUT PRESENCE OF ULCERS  *URINARY PROBLEMS  *BOWEL PROBLEMS  UNUSUAL RASH Items with * indicate a potential emergency and should be followed up as soon as possible.  Feel free to call the clinic should you have any questions or concerns. The clinic phone number is [phone].  Please show the CHEMO ALERT CARD at check-in to the Emergency Department and triage nurse.   

## 2020-10-05 ENCOUNTER — Other Ambulatory Visit: Payer: Self-pay

## 2020-10-05 ENCOUNTER — Ambulatory Visit
Admission: RE | Admit: 2020-10-05 | Discharge: 2020-10-05 | Disposition: A | Discharge status: Home / Self Care | Payer: Medicare PPO | Source: Ambulatory Visit | Attending: Radiation Oncology | Admitting: Radiation Oncology

## 2020-10-05 DIAGNOSIS — Z510 Encounter for antineoplastic radiation therapy: Secondary | ICD-10-CM | POA: Diagnosis not present

## 2020-10-06 ENCOUNTER — Ambulatory Visit
Admission: RE | Admit: 2020-10-06 | Discharge: 2020-10-06 | Disposition: A | Discharge status: Home / Self Care | Payer: Medicare PPO | Source: Ambulatory Visit | Attending: Radiation Oncology | Admitting: Radiation Oncology

## 2020-10-06 ENCOUNTER — Other Ambulatory Visit: Payer: Self-pay

## 2020-10-06 DIAGNOSIS — Z510 Encounter for antineoplastic radiation therapy: Secondary | ICD-10-CM | POA: Diagnosis not present

## 2020-10-07 ENCOUNTER — Ambulatory Visit
Admission: RE | Admit: 2020-10-07 | Discharge: 2020-10-07 | Disposition: A | Discharge status: Home / Self Care | Payer: Medicare PPO | Source: Ambulatory Visit | Attending: Radiation Oncology | Admitting: Radiation Oncology

## 2020-10-07 ENCOUNTER — Other Ambulatory Visit: Payer: Self-pay

## 2020-10-07 ENCOUNTER — Inpatient Hospital Stay: Payer: Medicare PPO

## 2020-10-07 DIAGNOSIS — C539 Malignant neoplasm of cervix uteri, unspecified: Secondary | ICD-10-CM

## 2020-10-07 DIAGNOSIS — Z5111 Encounter for antineoplastic chemotherapy: Secondary | ICD-10-CM | POA: Diagnosis not present

## 2020-10-07 DIAGNOSIS — Z510 Encounter for antineoplastic radiation therapy: Secondary | ICD-10-CM | POA: Diagnosis not present

## 2020-10-07 LAB — CBC WITH DIFFERENTIAL (CANCER CENTER ONLY)
Abs Immature Granulocytes: 0.04 10*3/uL (ref 0.00–0.07)
Basophils Absolute: 0 10*3/uL (ref 0.0–0.1)
Basophils Relative: 0 %
Eosinophils Absolute: 0.1 10*3/uL (ref 0.0–0.5)
Eosinophils Relative: 2 %
HCT: 27.9 % — ABNORMAL LOW (ref 36.0–46.0)
Hemoglobin: 9.1 g/dL — ABNORMAL LOW (ref 12.0–15.0)
Immature Granulocytes: 1 %
Lymphocytes Relative: 15 %
Lymphs Abs: 0.7 10*3/uL (ref 0.7–4.0)
MCH: 27.4 pg (ref 26.0–34.0)
MCHC: 32.6 g/dL (ref 30.0–36.0)
MCV: 84 fL (ref 80.0–100.0)
Monocytes Absolute: 0.4 10*3/uL (ref 0.1–1.0)
Monocytes Relative: 9 %
Neutro Abs: 3.3 10*3/uL (ref 1.7–7.7)
Neutrophils Relative %: 73 %
Platelet Count: 150 10*3/uL (ref 150–400)
RBC: 3.32 MIL/uL — ABNORMAL LOW (ref 3.87–5.11)
RDW: 14 % (ref 11.5–15.5)
WBC Count: 4.6 10*3/uL (ref 4.0–10.5)
nRBC: 0 % (ref 0.0–0.2)

## 2020-10-07 LAB — MAGNESIUM: Magnesium: 1.2 mg/dL — ABNORMAL LOW (ref 1.7–2.4)

## 2020-10-07 LAB — BASIC METABOLIC PANEL - CANCER CENTER ONLY
Anion gap: 8 (ref 5–15)
BUN: 18 mg/dL (ref 8–23)
CO2: 27 mmol/L (ref 22–32)
Calcium: 8.4 mg/dL — ABNORMAL LOW (ref 8.9–10.3)
Chloride: 102 mmol/L (ref 98–111)
Creatinine: 1.02 mg/dL — ABNORMAL HIGH (ref 0.44–1.00)
GFR, Estimated: 60 mL/min — ABNORMAL LOW (ref >60)
Glucose, Bld: 149 mg/dL — ABNORMAL HIGH (ref 70–99)
Potassium: 3.4 mmol/L — ABNORMAL LOW (ref 3.5–5.1)
Sodium: 137 mmol/L (ref 135–145)

## 2020-10-07 MED ORDER — HEPARIN SOD (PORK) LOCK FLUSH 100 UNIT/ML IV SOLN
500.0000 [IU] | Freq: Once | INTRAVENOUS | Status: AC
  Administered 2020-10-07: 500 [IU]
  Filled 2020-10-07: qty 5

## 2020-10-07 MED ORDER — SODIUM CHLORIDE 0.9% FLUSH
10.0000 mL | Freq: Once | INTRAVENOUS | Status: AC
  Administered 2020-10-07: 10 mL
  Filled 2020-10-07: qty 10

## 2020-10-07 NOTE — Patient Instructions (Signed)
Central Line, Adult A central line is a long, thin tube (catheter) that is put into a vein so that it goes to a large vein above your heart. It can be used to:  Give you medicine or fluids.  Give you food and nutrients.  Take blood or give you blood for testing or treatments. Types of central lines There are four main types of central lines:  Peripherally inserted central catheter (PICC) line. This type is usually put in the upper arm and goes up the arm to the heart.  Tunneled central line. This type is placed in a large vein in the neck, chest, or groin. It is tunneled under the skin and brought out through a second incision.  Non-tunneled central line. This type is used for a shorter time than other types, usually for 7 days at the most. It is inserted in the neck, chest, or groin.  Implanted port. This type can stay in place longer than other types of central lines. It is normally put in the upper chest but can also be placed in the upper arm or the belly. Surgery is needed to put it in and take it out. The type of central line you get will depend on how long you need it and your medical condition.   Tell a doctor about:  Any allergies you have.  All medicines you are taking. These include vitamins, herbs, eye drops, creams, and over-the-counter medicines.  Any problems you or family members have had with anesthetic medicines.  Any blood disorders you have.  Any surgeries you have had.  Any medical conditions you have.  Whether you are pregnant or may be pregnant. What are the risks? Generally, central lines are safe. However, problems may occur, including:  Infection.  A blood clot.  Bleeding from the place where the central line was inserted.  Getting a hole or crack in the central line. If this happens, the central line will need to be replaced.  Central line failure.  The catheter moving or coming out of place. What happens before the  procedure? Medicines  Ask your doctor about changing or stopping: ? Your normal medicines. ? Vitamins, herbs, and supplements. ? Over-the-counter medicines.  Do not take aspirin or ibuprofen unless you are told to. General instructions  Follow instructions from your doctor about eating or drinking.  For your safety, your doctor may: ? Mark the area of the procedure. ? Remove hair at the procedure site. ? Ask you to wash with a soap that kills germs.  Plan to have a responsible adult take you home from the hospital or clinic.  If you will be going home right after the procedure, plan to have a responsible adult care for you for the time you are told. This is important. What happens during the procedure?  An IV tube will be put into one of your veins.  You may be given: ? A sedative. This medicine helps you relax. ? Anesthetics. These medicines numb certain areas of your body.  Your skin will be cleaned with a germ-killing (antiseptic) solution. You may be covered with clean drapes.  Your blood pressure, heart rate, breathing rate, and blood oxygen level will be monitored during the procedure.  The central line will be put into the vein and moved through it to the correct spot. The doctor may use X-ray equipment to help guide the central line to the right place.  A bandage (dressing) will be placed over the insertion area.   The procedure may vary among doctors and hospitals. What can I expect after the procedure?  You will be monitored until you leave the hospital or clinic. This includes checking your blood pressure, heart rate, breathing rate, and blood oxygen level.  Caps may be placed on the ends of the central line tubing.  If you were given a sedative during your procedure, do not drive or use machines until your doctor says that it is safe. Follow these instructions at home: Caring for the tube  Follow instructions from your doctor about: ? Flushing the  tube. ? Cleaning the tube and the area around it.  Only use germ-free (sterile) supplies to flush. The supplies should be from your doctor, a pharmacy, or another place that your doctor recommends.  Before you flush the tube or clean the area around the tube: ? Wash your hands with soap and water for at least 20 seconds. If you cannot use soap and water, use hand sanitizer. ? Clean the central line hub with rubbing alcohol. To do this:  Scrub it using a twisting motion and rub for 10 to 15 seconds or for 30 twists. Follow the manufacturer's instructions.  Be sure you scrub the top of the hub, not just the sides. Never reuse alcohol pads.  Let the hub dry before use. Keep it from touching anything while drying.   Caring for your skin  Check the skin around the central line every day for signs of infection. Check for: ? Redness, swelling, or pain. ? Fluid or blood. ? Warmth. ? Pus or a bad smell.  Keep the area where the tube was put in clean and dry.  Change bandages only as told by your doctor.  Keep your bandage dry. If a bandage gets wet, have it changed right away. General instructions  Keep the tube clamped, unless it is being used.  If you or someone else accidentally pulls on the tube, make sure: ? The bandage is okay. ? There is no bleeding. ? The tube has not been pulled out.  Do not use scissors or sharp objects near the tube.  Do not take baths, swim, or use a hot tub until your doctor says it is okay. Ask your doctor if you may take showers. You may only be allowed to take sponge baths.  Ask your doctor what activities are safe for you. Your doctor may tell you not to lift anything or move your arm too much.  Take over-the-counter and prescription medicines only as told by your doctor.  Keep all follow-up visits. Storing and throwing away supplies  Keep your supplies in a clean, dry location.  Throw away any used syringes in a container that is only for  sharp items (sharps container). You can buy a sharps container from a pharmacy, or you can make one by using an empty hard plastic bottle with a cover.  Place any used bandages or infusion bags into a plastic bag. Throw that bag in the trash. Contact a doctor if:  You have any of these signs of infection where the tube was put in: ? Redness, swelling, or pain. ? Fluid or blood. ? Warmth. ? Pus or a bad smell. Get help right away if:  You have: ? A fever or chills. ? Shortness of breath. ? Pain in your chest. ? A fast heartbeat. ? Swelling in your neck, face, chest, or arm.  You feel dizzy or you faint.  There are red lines coming from   where the tube was put in.  The area where the tube was put in is bleeding and the bleeding will not stop.  Your tube is hard to flush.  You do not get a blood return from the tube.  The tube gets loose or comes out.  The tube has a hole or a tear.  The tube leaks. Summary  A central line is a long, thin tube (catheter) that is put in your vein. It can be used to give you medicine, food, or fluids.  Follow instructions from your doctor about flushing and cleaning the tube.  Keep the area where the tube was put in clean and dry.  Ask your doctor what activities are safe for you. This information is not intended to replace advice given to you by your health care provider. Make sure you discuss any questions you have with your health care provider. Document Revised: 04/29/2020 Document Reviewed: 04/29/2020 Elsevier Patient Education  2021 [employer]  

## 2020-10-08 ENCOUNTER — Other Ambulatory Visit: Payer: Self-pay | Admitting: Hematology and Oncology

## 2020-10-08 ENCOUNTER — Other Ambulatory Visit: Payer: Self-pay

## 2020-10-08 ENCOUNTER — Ambulatory Visit
Admission: RE | Admit: 2020-10-08 | Discharge: 2020-10-08 | Disposition: A | Discharge status: Home / Self Care | Payer: Medicare PPO | Source: Ambulatory Visit | Attending: Radiation Oncology | Admitting: Radiation Oncology

## 2020-10-08 ENCOUNTER — Inpatient Hospital Stay: Payer: Medicare PPO | Admitting: Hematology and Oncology

## 2020-10-08 ENCOUNTER — Encounter: Payer: Self-pay | Admitting: Hematology and Oncology

## 2020-10-08 DIAGNOSIS — N183 Chronic kidney disease, stage 3 unspecified: Secondary | ICD-10-CM

## 2020-10-08 DIAGNOSIS — C539 Malignant neoplasm of cervix uteri, unspecified: Secondary | ICD-10-CM | POA: Diagnosis not present

## 2020-10-08 DIAGNOSIS — E538 Deficiency of other specified B group vitamins: Secondary | ICD-10-CM

## 2020-10-08 DIAGNOSIS — Z5111 Encounter for antineoplastic chemotherapy: Secondary | ICD-10-CM | POA: Diagnosis not present

## 2020-10-08 DIAGNOSIS — D539 Nutritional anemia, unspecified: Secondary | ICD-10-CM

## 2020-10-08 DIAGNOSIS — Z510 Encounter for antineoplastic radiation therapy: Secondary | ICD-10-CM | POA: Diagnosis not present

## 2020-10-08 MED ORDER — ACETAMINOPHEN 500 MG PO TABS
ORAL_TABLET | ORAL | Status: AC
  Filled 2020-10-08: qty 2

## 2020-10-08 NOTE — Assessment & Plan Note (Signed)
Overall, she tolerated treatment very well except for persistent anemia and slightly worsening hypomagnesemia She has no side effects such as nausea or constipation No peripheral neuropathy I will continue treatment as scheduled and I will see her on a weekly basis for further follow-up

## 2020-10-08 NOTE — Progress Notes (Signed)
[facility] OFFICE PROGRESS NOTE  Patient Care Team: Alvester Chou, NP as PCP - General (Nurse Practitioner) Jacqulyn Liner, RN as Oncology Nurse Navigator (Oncology)  ASSESSMENT & PLAN:  Cervical cancer ([place]) Overall, she tolerated treatment very well except for persistent anemia and slightly worsening hypomagnesemia She has no side effects such as nausea or constipation No peripheral neuropathy I will continue treatment as scheduled and I will see her on a weekly basis for further follow-up  Hypomagnesemia Her magnesium level is worse despite oral supplement I have asked pharmacist to increase IV magnesium premed next week to 6 g  Chronic kidney disease (CKD), stage III (moderate) ([place]) This could be related to dehydration or she might have chronic kidney disease related to diabetes Renal function is stable Observe closely   Vitamin B12 deficiency She will continue vitamin B12 injection We discussed the risk and benefits of IV iron I recommend we proceed with IV iron in the future  Deficiency anemia The most likely cause of her anemia is due to chronic blood loss/malabsorption syndrome. We discussed some of the risks, benefits, and alternatives of intravenous iron infusions. The patient is symptomatic from anemia and the iron level is critically low. She tolerated oral iron supplement poorly and desires to achieved higher levels of iron faster for adequate hematopoesis. Some of the side-effects to be expected including risks of infusion reactions, phlebitis, headaches, nausea and fatigue.  The patient is willing to proceed. Patient education material was dispensed.  Goal is to keep ferritin level greater than 50 and resolution of anemia She is in agreement to proceed    No orders of the defined types were placed in this encounter.   All questions were answered. The patient knows to call the clinic with any problems, questions or concerns. The total time spent in  the appointment was 30 minutes encounter with patients including review of chart and various tests results, discussions about plan of care and coordination of care plan   Heath Lark, MD 10/08/2020 11:56 AM  INTERVAL HISTORY: Please see below for problem oriented charting. She returns for her weekly treatment She tolerated recent chemo well Her energy level is fair Her vaginal bleeding has stopped Her blood pressure is noted to be elevated here but better controlled at home around systolic of [id number] She denies pelvic pain No nausea Denies peripheral neuropathy She has mild loose stool usually around Wednesdays but that resolved spontaneously  SUMMARY OF ONCOLOGIC HISTORY: Oncology History  Cervical cancer ([place])  08/13/2020 Pathology Results   Cervical biopsy showed poorly differentiated carcinoma. 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   08/27/2020 Imaging   MRI pelvis Large cervical mass with pelvic adenopathy, at least T2B N1 disease. Question of involvement of posterior urinary bladder (T4) for which additional images have been requested. Patient will return for additional images to answer this question. (Thinner section axial T2 without fat sat and sagittal T2 weighted imaging also without fat saturation.)   Small field-of-view images show that the parametrial extension comes in very close proximity to the RIGHT ureter.   08/30/2020 Initial Diagnosis   Cervical cancer ([place])   08/30/2020 Cancer Staging   Staging form: Cervix Uteri, AJCC Version 9 - Clinical stage from 08/30/2020: Stage IIIC1 (cT2, cN1, cM0) - Signed by Heath Lark, MD on 08/30/2020   09/01/2020 PET scan   1. Large cervical mass extending into the endometrial canal to the level of the uterine fundus, associated with  pelvic adenopathy to the level of the RIGHT common iliac chain. Please refer to MRI for further detail. 2. No signs of solid organ uptake or metastatic disease to the  chest. 3. Subcutaneous nodule with marked increased metabolic activity, correlation with direct clinical inspection is suggested to exclude cutaneous neoplasm or subcutaneous nodule in this location. Complicated sebaceous cyst could also potentially have this appearance. 4. Uptake in the anal canal could likely physiologic. Given the intense nature of the uptake would suggest correlation with direct clinical inspection/examination. 5. Uptake in axillary lymph nodes with activity less than mediastinal blood pool likely small reactive lymph nodes, attention on follow-up. Morphologic features also with benign appearance.   09/16/2020 Procedure   Successful placement of a power injectable Port-A-Cath via the right internal jugular vein. The catheter is ready for immediate use.     09/20/2020 -  Chemotherapy    Patient is on Treatment Plan: CERVICAL CISPLATIN Q7D      Malignant neoplasm of cervix ([place])  09/01/2020 Initial Diagnosis   Malignant neoplasm of cervix ([place])   09/20/2020 -  Chemotherapy    Patient is on Treatment Plan: CERVICAL CISPLATIN Q7D        REVIEW OF SYSTEMS:   Constitutional: Denies fevers, chills or abnormal weight loss Eyes: Denies blurriness of vision Ears, nose, mouth, throat, and face: Denies mucositis or sore throat Respiratory: Denies cough, dyspnea or wheezes Cardiovascular: Denies palpitation, chest discomfort or lower extremity swelling Skin: Denies abnormal skin rashes Lymphatics: Denies new lymphadenopathy or easy bruising Neurological:Denies numbness, tingling or new weaknesses Behavioral/Psych: Mood is stable, no new changes  All other systems were reviewed with the patient and are negative.  I have reviewed the past medical history, past surgical history, social history and family history with the patient and they are unchanged from previous note.  ALLERGIES:  has No Known Allergies.  MEDICATIONS:  Current Outpatient Medications  Medication Sig  Dispense Refill  . amLODipine (NORVASC) 10 MG tablet Take 1 tablet (10 mg total) by mouth daily. 30 tablet 11  . ezetimibe (ZETIA) 10 MG tablet Take 10 mg by mouth daily.    Marland Kitchen lidocaine-prilocaine (EMLA) cream Apply to affected area once 30 g 3  . magnesium oxide (MAG-OX) 400 (241.3 Mg) MG tablet Take 1 tablet (400 mg total) by mouth 2 (two) times daily. 60 tablet 11  . metFORMIN (GLUCOPHAGE) 1000 MG tablet Take 1,000 mg by mouth 2 (two) times daily with a meal.    . ondansetron (ZOFRAN) 8 MG tablet Take 1 tablet (8 mg total) by mouth every 8 (eight) hours as needed. Start on the third day after cisplatin chemotherapy. 30 tablet 1  . prochlorperazine (COMPAZINE) 10 MG tablet Take 1 tablet (10 mg total) by mouth every 6 (six) hours as needed (Nausea or vomiting). 30 tablet 1  . rosuvastatin (CRESTOR) 40 MG tablet Take 40 mg by mouth daily.    . sitaGLIPtin (JANUVIA) 50 MG tablet Take 50 mg by mouth 2 (two) times daily.     No current facility-administered medications for this visit.    PHYSICAL EXAMINATION: ECOG PERFORMANCE STATUS: 1 - Symptomatic but completely ambulatory  Vitals:   10/08/20 1148  BP: (!) 153/76  Pulse: (!) 116  Resp: 17  Temp: 98.8 F (37.1 C)  SpO2: 99%   Filed Weights   10/08/20 1148  Weight: 210 lb 9.6 oz (95.5 kg)    GENERAL:alert, no distress and comfortable Musculoskeletal:no cyanosis of digits and no clubbing  NEURO: alert &  oriented x 3 with fluent speech, no focal motor/sensory deficits  LABORATORY DATA:  I have reviewed the data as listed    Component Value Date/Time   NA 137 10/07/2020 1120   K 3.4 (L) 10/07/2020 1120   CL 102 10/07/2020 1120   CO2 27 10/07/2020 1120   GLUCOSE 149 (H) 10/07/2020 1120   BUN 18 10/07/2020 1120   CREATININE 1.02 (H) 10/07/2020 1120   CALCIUM 8.4 (L) 10/07/2020 1120   GFRNONAA 60 (L) 10/07/2020 1120    No results found for: SPEP, UPEP  Lab Results  Component Value Date   WBC 4.6 10/07/2020   NEUTROABS  3.3 10/07/2020   HGB 9.1 (L) 10/07/2020   HCT 27.9 (L) 10/07/2020   MCV 84.0 10/07/2020   PLT 150 10/07/2020      Chemistry      Component Value Date/Time   NA 137 10/07/2020 1120   K 3.4 (L) 10/07/2020 1120   CL 102 10/07/2020 1120   CO2 27 10/07/2020 1120   BUN 18 10/07/2020 1120   CREATININE 1.02 (H) 10/07/2020 1120      Component Value Date/Time   CALCIUM 8.4 (L) 10/07/2020 1120       RADIOGRAPHIC STUDIES: I have personally reviewed the radiological images as listed and agreed with the findings in the report. IR IMAGING GUIDED PORT INSERTION  Result Date: 09/16/2020 INDICATION: 70 year old female with history of cervical cancer requiring central venous access for chemotherapy. EXAM: IMPLANTED PORT A CATH PLACEMENT WITH ULTRASOUND AND FLUOROSCOPIC GUIDANCE COMPARISON:  None. MEDICATIONS: Ancef 2 gm IV; The antibiotic was administered within an appropriate time interval prior to skin puncture. ANESTHESIA/SEDATION: Moderate (conscious) sedation was employed during this procedure. A total of Versed 3 mg and Fentanyl 100 mcg was administered intravenously. Moderate Sedation Time: 14 minutes. The patient's level of consciousness and vital signs were monitored continuously by radiology nursing throughout the procedure under my direct supervision. CONTRAST:  None FLUOROSCOPY TIME:  0 minutes, 6 seconds (3 mGy) COMPLICATIONS: None immediate. PROCEDURE: The procedure, risks, benefits, and alternatives were explained to the patient. Questions regarding the procedure were encouraged and answered. The patient understands and consents to the procedure. The right neck and chest were prepped with chlorhexidine in a sterile fashion, and a sterile drape was applied covering the operative field. Maximum barrier sterile technique with sterile gowns and gloves were used for the procedure. A timeout was performed prior to the initiation of the procedure. Ultrasound was used to examine the jugular vein which  was compressible and free of internal echoes. A skin marker was used to demarcate the planned venotomy and port pocket incision sites. Local anesthesia was provided to these sites and the subcutaneous tunnel track with 1% lidocaine with 1:100,000 epinephrine. A small incision was created at the jugular access site and blunt dissection was performed of the subcutaneous tissues. Under real time ultrasound guidance, the jugular vein was accessed with a 21 ga micropuncture needle and an 0.018" wire was inserted to the superior vena cava. A 5 Fr micopuncture set was then used, through which a 0.035" Rosen wire was passed under fluoroscopic guidance into the inferior vena cava. An 8 Fr dilator was then placed over the wire. A subcutaneous port pocket was then created along the upper chest wall utilizing a combination of sharp and blunt dissection. The pocket was irrigated with sterile saline, packed with gauze, and observed for hemorrhage. A single lumen "standard sized" power injectable port was chosen for placement. The 8 Fr  catheter was tunneled from the port pocket site to the venotomy incision. The port was placed in the pocket. The external catheter was trimmed to appropriate length. The dilator was exchanged for an 8 Fr peel-away sheath under fluoroscopic guidance. The catheter was then placed through the sheath and the sheath was removed. Final catheter positioning was confirmed and documented with a fluoroscopic spot radiograph. The port was accessed with a Huber needle, aspirated, and flushed with heparinized saline. The deep dermal layer of the port pocket incision was closed with interrupted 3-0 Vicryl suture. Dermabond was then placed over the port pocket and neck incisions. The patient tolerated the procedure well without immediate post procedural complication. FINDINGS: After catheter placement, the tip lies within the cavoatrial junction. The catheter aspirates and flushes normally and is ready for  immediate use. IMPRESSION: Successful placement of a power injectable Port-A-Cath via the right internal jugular vein. The catheter is ready for immediate use. Ruthann Cancer, MD [facility] Electronically Signed   By: Ruthann Cancer MD   On: 09/16/2020 14:59

## 2020-10-08 NOTE — Assessment & Plan Note (Signed)
She will continue vitamin B12 injection We discussed the risk and benefits of IV iron I recommend we proceed with IV iron in the future

## 2020-10-08 NOTE — Assessment & Plan Note (Signed)
The most likely cause of her anemia is due to chronic blood loss/malabsorption syndrome. We discussed some of the risks, benefits, and alternatives of intravenous iron infusions. The patient is symptomatic from anemia and the iron level is critically low. She tolerated oral iron supplement poorly and desires to achieved higher levels of iron faster for adequate hematopoesis. Some of the side-effects to be expected including risks of infusion reactions, phlebitis, headaches, nausea and fatigue.  The patient is willing to proceed. Patient education material was dispensed.  Goal is to keep ferritin level greater than 50 and resolution of anemia She is in agreement to proceed

## 2020-10-08 NOTE — Assessment & Plan Note (Signed)
Her magnesium level is worse despite oral supplement I have asked pharmacist to increase IV magnesium premed next week to 6 g 

## 2020-10-08 NOTE — Assessment & Plan Note (Signed)
This could be related to dehydration or she might have chronic kidney disease related to diabetes Renal function is stable Observe closely

## 2020-10-11 ENCOUNTER — Other Ambulatory Visit: Payer: Self-pay

## 2020-10-11 ENCOUNTER — Inpatient Hospital Stay: Payer: Medicare PPO

## 2020-10-11 ENCOUNTER — Ambulatory Visit
Admission: RE | Admit: 2020-10-11 | Discharge: 2020-10-11 | Disposition: A | Discharge status: Home / Self Care | Payer: Medicare PPO | Source: Ambulatory Visit | Attending: Radiation Oncology | Admitting: Radiation Oncology

## 2020-10-11 VITALS — BP 148/72 | HR 88 | Temp 98.2°F | Resp 18

## 2020-10-11 DIAGNOSIS — C539 Malignant neoplasm of cervix uteri, unspecified: Secondary | ICD-10-CM

## 2020-10-11 DIAGNOSIS — Z5111 Encounter for antineoplastic chemotherapy: Secondary | ICD-10-CM | POA: Diagnosis not present

## 2020-10-11 DIAGNOSIS — Z510 Encounter for antineoplastic radiation therapy: Secondary | ICD-10-CM | POA: Diagnosis not present

## 2020-10-11 MED ORDER — SODIUM CHLORIDE 0.9 % IV SOLN
Freq: Once | INTRAVENOUS | Status: AC
  Filled 2020-10-11: qty 250

## 2020-10-11 MED ORDER — CYANOCOBALAMIN 1000 MCG/ML IJ SOLN
1000.0000 ug | Freq: Once | INTRAMUSCULAR | Status: AC
  Administered 2020-10-11: 1000 ug via INTRAMUSCULAR

## 2020-10-11 MED ORDER — SODIUM CHLORIDE 0.9 % IV SOLN
510.0000 mg | Freq: Once | INTRAVENOUS | Status: AC
  Administered 2020-10-11: 510 mg via INTRAVENOUS
  Filled 2020-10-11: qty 510

## 2020-10-11 MED ORDER — CYANOCOBALAMIN 1000 MCG/ML IJ SOLN
INTRAMUSCULAR | Status: AC
  Filled 2020-10-11: qty 1

## 2020-10-11 MED ORDER — ACETAMINOPHEN 325 MG PO TABS
ORAL_TABLET | ORAL | Status: AC
  Filled 2020-10-11: qty 2

## 2020-10-11 MED ORDER — PALONOSETRON HCL INJECTION 0.25 MG/5ML
0.2500 mg | Freq: Once | INTRAVENOUS | Status: AC
  Administered 2020-10-11: 0.25 mg via INTRAVENOUS

## 2020-10-11 MED ORDER — SODIUM CHLORIDE 0.9 % IV SOLN
10.0000 mg | Freq: Once | INTRAVENOUS | Status: AC
  Administered 2020-10-11: 10 mg via INTRAVENOUS
  Filled 2020-10-11: qty 10

## 2020-10-11 MED ORDER — SODIUM CHLORIDE 0.9 % IV SOLN
150.0000 mg | Freq: Once | INTRAVENOUS | Status: AC
  Administered 2020-10-11: 150 mg via INTRAVENOUS
  Filled 2020-10-11: qty 150

## 2020-10-11 MED ORDER — SODIUM CHLORIDE 0.9 % IV SOLN
36.0000 mg/m2 | Freq: Once | INTRAVENOUS | Status: AC
  Administered 2020-10-11: 74 mg via INTRAVENOUS
  Filled 2020-10-11: qty 74

## 2020-10-11 MED ORDER — MAGNESIUM SULFATE 2 GM/50ML IV SOLN
INTRAVENOUS | Status: AC
  Filled 2020-10-11: qty 150

## 2020-10-11 MED ORDER — MAGNESIUM SULFATE 2 GM/50ML IV SOLN
6.0000 g | Freq: Once | INTRAVENOUS | Status: AC
  Administered 2020-10-11: 6 g via INTRAVENOUS

## 2020-10-11 MED ORDER — PALONOSETRON HCL INJECTION 0.25 MG/5ML
INTRAVENOUS | Status: AC
  Filled 2020-10-11: qty 5

## 2020-10-11 MED ORDER — FAMOTIDINE IN NACL 20-0.9 MG/50ML-% IV SOLN
INTRAVENOUS | Status: AC
  Filled 2020-10-11: qty 50

## 2020-10-11 MED ORDER — PROCHLORPERAZINE MALEATE 10 MG PO TABS
ORAL_TABLET | ORAL | Status: AC
  Filled 2020-10-11: qty 1

## 2020-10-11 MED ORDER — SODIUM CHLORIDE 0.9 % IV SOLN
Freq: Once | INTRAVENOUS | Status: DC
  Filled 2020-10-11: qty 250

## 2020-10-11 MED ORDER — POTASSIUM CHLORIDE IN NACL 20-0.9 MEQ/L-% IV SOLN
Freq: Once | INTRAVENOUS | Status: AC
  Filled 2020-10-11: qty 1000

## 2020-10-11 NOTE — Patient Instructions (Signed)
[facility] Discharge Instructions for Patients Receiving Chemotherapy  Today you received the following chemotherapy agents Cisplatin  To help prevent nausea and vomiting after your treatment, we encourage you to take your nausea medication as directed   If you develop nausea and vomiting that is not controlled by your nausea medication, call the clinic.   BELOW ARE SYMPTOMS THAT SHOULD BE REPORTED IMMEDIATELY:  *FEVER GREATER THAN 100.5 F  *CHILLS WITH OR WITHOUT FEVER  NAUSEA AND VOMITING THAT IS NOT CONTROLLED WITH YOUR NAUSEA MEDICATION  *UNUSUAL SHORTNESS OF BREATH  *UNUSUAL BRUISING OR BLEEDING  TENDERNESS IN MOUTH AND THROAT WITH OR WITHOUT PRESENCE OF ULCERS  *URINARY PROBLEMS  *BOWEL PROBLEMS  UNUSUAL RASH Items with * indicate a potential emergency and should be followed up as soon as possible.  Feel free to call the clinic should you have any questions or concerns. The clinic phone number is [phone].  Please show the [facility] at check-in to the Emergency Department and triage nurse.  Ferumoxytol injection What is this medicine? FERUMOXYTOL is an iron complex. Iron is used to make healthy red blood cells, which carry oxygen and nutrients throughout the body. This medicine is used to treat iron deficiency anemia. This medicine may be used for other purposes; ask your health care provider or pharmacist if you have questions. COMMON BRAND NAME(S): Feraheme What should I tell my health care provider before I take this medicine? They need to know if you have any of these conditions:  anemia not caused by low iron levels  high levels of iron in the blood  magnetic resonance imaging (MRI) test scheduled  an unusual or allergic reaction to iron, other medicines, foods, dyes, or preservatives  pregnant or trying to get pregnant  breast-feeding How should I use this medicine? This medicine is for injection into a vein. It is given by  a health care professional in a hospital or clinic setting. Talk to your pediatrician regarding the use of this medicine in children. Special care may be needed. Overdosage: If you think you have taken too much of this medicine contact a poison control center or emergency room at once. NOTE: This medicine is only for you. Do not share this medicine with others. What if I miss a dose? It is important not to miss your dose. Call your doctor or health care professional if you are unable to keep an appointment. What may interact with this medicine? This medicine may interact with the following medications:  other iron products This list may not describe all possible interactions. Give your health care provider a list of all the medicines, herbs, non-prescription drugs, or dietary supplements you use. Also tell them if you smoke, drink alcohol, or use illegal drugs. Some items may interact with your medicine. What should I watch for while using this medicine? Visit your doctor or healthcare professional regularly. Tell your doctor or healthcare professional if your symptoms do not start to get better or if they get worse. You may need blood work done while you are taking this medicine. You may need to follow a special diet. Talk to your doctor. Foods that contain iron include: whole grains/cereals, dried fruits, beans, or peas, leafy green vegetables, and organ meats (liver, kidney). What side effects may I notice from receiving this medicine? Side effects that you should report to your doctor or health care professional as soon as possible:  allergic reactions like skin rash, itching or hives, swelling of  the face, lips, or tongue  breathing problems  changes in blood pressure  feeling faint or lightheaded, falls  fever or chills  flushing, sweating, or hot feelings  swelling of the ankles or feet Side effects that usually do not require medical attention (report to your doctor or health  care professional if they continue or are bothersome):  diarrhea  headache  nausea, vomiting  stomach pain This list may not describe all possible side effects. Call your doctor for medical advice about side effects. You may report side effects to FDA at 1-800-FDA-1088. Where should I keep my medicine? This drug is given in a hospital or clinic and will not be stored at home. NOTE: This sheet is a summary. It may not cover all possible information. If you have questions about this medicine, talk to your doctor, pharmacist, or health care provider.  2021 Elsevier/Gold Standard (2016-10-16 20:21:10)

## 2020-10-11 NOTE — Progress Notes (Signed)
Pt discharged in no apparent distress. Pt left ambulatory without assistance. Pt aware of discharge instructions and verbalized understanding and had no further questions.  

## 2020-10-12 ENCOUNTER — Ambulatory Visit
Admission: RE | Admit: 2020-10-12 | Discharge: 2020-10-12 | Disposition: A | Discharge status: Home / Self Care | Payer: Medicare PPO | Source: Ambulatory Visit | Attending: Radiation Oncology | Admitting: Radiation Oncology

## 2020-10-12 DIAGNOSIS — Z510 Encounter for antineoplastic radiation therapy: Secondary | ICD-10-CM | POA: Diagnosis not present

## 2020-10-12 DIAGNOSIS — C539 Malignant neoplasm of cervix uteri, unspecified: Secondary | ICD-10-CM | POA: Insufficient documentation

## 2020-10-13 ENCOUNTER — Ambulatory Visit
Admission: RE | Admit: 2020-10-13 | Discharge: 2020-10-13 | Disposition: A | Discharge status: Home / Self Care | Payer: Medicare PPO | Source: Ambulatory Visit | Attending: Radiation Oncology | Admitting: Radiation Oncology

## 2020-10-13 DIAGNOSIS — Z510 Encounter for antineoplastic radiation therapy: Secondary | ICD-10-CM | POA: Diagnosis not present

## 2020-10-14 ENCOUNTER — Inpatient Hospital Stay: Payer: Medicare PPO | Attending: Gynecologic Oncology

## 2020-10-14 ENCOUNTER — Ambulatory Visit
Admission: RE | Admit: 2020-10-14 | Discharge: 2020-10-14 | Disposition: A | Discharge status: Home / Self Care | Payer: Medicare PPO | Source: Ambulatory Visit | Attending: Radiation Oncology | Admitting: Radiation Oncology

## 2020-10-14 ENCOUNTER — Other Ambulatory Visit: Payer: Self-pay

## 2020-10-14 ENCOUNTER — Inpatient Hospital Stay: Payer: Medicare PPO

## 2020-10-14 DIAGNOSIS — D61810 Antineoplastic chemotherapy induced pancytopenia: Secondary | ICD-10-CM | POA: Insufficient documentation

## 2020-10-14 DIAGNOSIS — C539 Malignant neoplasm of cervix uteri, unspecified: Secondary | ICD-10-CM

## 2020-10-14 DIAGNOSIS — E876 Hypokalemia: Secondary | ICD-10-CM | POA: Diagnosis not present

## 2020-10-14 DIAGNOSIS — D509 Iron deficiency anemia, unspecified: Secondary | ICD-10-CM | POA: Diagnosis not present

## 2020-10-14 DIAGNOSIS — Z5111 Encounter for antineoplastic chemotherapy: Secondary | ICD-10-CM | POA: Diagnosis not present

## 2020-10-14 DIAGNOSIS — N183 Chronic kidney disease, stage 3 unspecified: Secondary | ICD-10-CM | POA: Diagnosis not present

## 2020-10-14 DIAGNOSIS — Z510 Encounter for antineoplastic radiation therapy: Secondary | ICD-10-CM | POA: Diagnosis not present

## 2020-10-14 LAB — CBC WITH DIFFERENTIAL (CANCER CENTER ONLY)
Abs Immature Granulocytes: 0.02 10*3/uL (ref 0.00–0.07)
Basophils Absolute: 0 10*3/uL (ref 0.0–0.1)
Basophils Relative: 1 %
Eosinophils Absolute: 0.3 10*3/uL (ref 0.0–0.5)
Eosinophils Relative: 8 %
HCT: 28.1 % — ABNORMAL LOW (ref 36.0–46.0)
Hemoglobin: 9.7 g/dL — ABNORMAL LOW (ref 12.0–15.0)
Immature Granulocytes: 1 %
Lymphocytes Relative: 12 %
Lymphs Abs: 0.5 10*3/uL — ABNORMAL LOW (ref 0.7–4.0)
MCH: 28 pg (ref 26.0–34.0)
MCHC: 34.5 g/dL (ref 30.0–36.0)
MCV: 81 fL (ref 80.0–100.0)
Monocytes Absolute: 0.4 10*3/uL (ref 0.1–1.0)
Monocytes Relative: 9 %
Neutro Abs: 2.9 10*3/uL (ref 1.7–7.7)
Neutrophils Relative %: 69 %
Platelet Count: 149 10*3/uL — ABNORMAL LOW (ref 150–400)
RBC: 3.47 MIL/uL — ABNORMAL LOW (ref 3.87–5.11)
RDW: 14.7 % (ref 11.5–15.5)
WBC Count: 4.2 10*3/uL (ref 4.0–10.5)
nRBC: 0 % (ref 0.0–0.2)

## 2020-10-14 LAB — BASIC METABOLIC PANEL - CANCER CENTER ONLY
Anion gap: 13 (ref 5–15)
BUN: 15 mg/dL (ref 8–23)
CO2: 28 mmol/L (ref 22–32)
Calcium: 9.2 mg/dL (ref 8.9–10.3)
Chloride: 99 mmol/L (ref 98–111)
Creatinine: 1.05 mg/dL — ABNORMAL HIGH (ref 0.44–1.00)
GFR, Estimated: 58 mL/min — ABNORMAL LOW (ref >60)
Glucose, Bld: 184 mg/dL — ABNORMAL HIGH (ref 70–99)
Potassium: 3.6 mmol/L (ref 3.5–5.1)
Sodium: 140 mmol/L (ref 135–145)

## 2020-10-14 LAB — MAGNESIUM: Magnesium: 1.2 mg/dL — ABNORMAL LOW (ref 1.7–2.4)

## 2020-10-15 ENCOUNTER — Inpatient Hospital Stay: Payer: Medicare PPO | Admitting: Hematology and Oncology

## 2020-10-15 ENCOUNTER — Ambulatory Visit
Admission: RE | Admit: 2020-10-15 | Discharge: 2020-10-15 | Disposition: A | Discharge status: Home / Self Care | Payer: Medicare PPO | Source: Ambulatory Visit | Attending: Radiation Oncology | Admitting: Radiation Oncology

## 2020-10-15 ENCOUNTER — Other Ambulatory Visit: Payer: Self-pay

## 2020-10-15 ENCOUNTER — Encounter: Payer: Self-pay | Admitting: Hematology and Oncology

## 2020-10-15 DIAGNOSIS — D539 Nutritional anemia, unspecified: Secondary | ICD-10-CM

## 2020-10-15 DIAGNOSIS — Z5111 Encounter for antineoplastic chemotherapy: Secondary | ICD-10-CM | POA: Diagnosis not present

## 2020-10-15 DIAGNOSIS — C539 Malignant neoplasm of cervix uteri, unspecified: Secondary | ICD-10-CM

## 2020-10-15 DIAGNOSIS — Z510 Encounter for antineoplastic radiation therapy: Secondary | ICD-10-CM | POA: Diagnosis not present

## 2020-10-15 NOTE — Assessment & Plan Note (Signed)
The most likely cause of her anemia is due to chronic blood loss/malabsorption syndrome. We discussed some of the risks, benefits, and alternatives of intravenous iron infusions. The patient is symptomatic from anemia and the iron level is critically low. She tolerated oral iron supplement poorly and desires to achieved higher levels of iron faster for adequate hematopoesis. Some of the side-effects to be expected including risks of infusion reactions, phlebitis, headaches, nausea and fatigue.  The patient is willing to proceed. Patient education material was dispensed.  Goal is to keep ferritin level greater than 50 and resolution of anemia She is in agreement to proceed She will also get vitamin B12 injection next week

## 2020-10-15 NOTE — Assessment & Plan Note (Signed)
Overall, she tolerated treatment very well except for persistent anemia and hypomagnesemia She has no side effects such as nausea or constipation No peripheral neuropathy I will continue treatment as scheduled and I will see her on a weekly basis for further follow-up

## 2020-10-15 NOTE — Assessment & Plan Note (Signed)
Her magnesium level is worse despite oral supplement I have asked pharmacist to increase IV magnesium premed next week to 6 g

## 2020-10-15 NOTE — Progress Notes (Signed)
[facility] OFFICE PROGRESS NOTE  Patient Care Team: Alvester Chou, NP as PCP - General (Nurse Practitioner) Jacqulyn Liner, RN as Oncology Nurse Navigator (Oncology)  ASSESSMENT & PLAN:  Cervical cancer ([place]) Overall, she tolerated treatment very well except for persistent anemia and hypomagnesemia She has no side effects such as nausea or constipation No peripheral neuropathy I will continue treatment as scheduled and I will see her on a weekly basis for further follow-up  Hypomagnesemia Her magnesium level is worse despite oral supplement I have asked pharmacist to increase IV magnesium premed next week to 6 g  Deficiency anemia The most likely cause of her anemia is due to chronic blood loss/malabsorption syndrome. We discussed some of the risks, benefits, and alternatives of intravenous iron infusions. The patient is symptomatic from anemia and the iron level is critically low. She tolerated oral iron supplement poorly and desires to achieved higher levels of iron faster for adequate hematopoesis. Some of the side-effects to be expected including risks of infusion reactions, phlebitis, headaches, nausea and fatigue.  The patient is willing to proceed. Patient education material was dispensed.  Goal is to keep ferritin level greater than 50 and resolution of anemia She is in agreement to proceed She will also get vitamin B12 injection next week   No orders of the defined types were placed in this encounter.   All questions were answered. The patient knows to call the clinic with any problems, questions or concerns. The total time spent in the appointment was 20 minutes encounter with patients including review of chart and various tests results, discussions about plan of care and coordination of care plan   Heath Lark, MD 10/15/2020 12:25 PM  INTERVAL HISTORY: Please see below for problem oriented charting. She returns for further follow-up She is doing well No side  effects from treatment so far She typically has loose stool around Wednesdays after treatment Denies peripheral neuropathy Her energy level is fair  SUMMARY OF ONCOLOGIC HISTORY: Oncology History  Cervical cancer ([place])  08/13/2020 Pathology Results   Cervical biopsy showed poorly differentiated carcinoma. 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   08/27/2020 Imaging   MRI pelvis Large cervical mass with pelvic adenopathy, at least T2B N1 disease. Question of involvement of posterior urinary bladder (T4) for which additional images have been requested. Patient will return for additional images to answer this question. (Thinner section axial T2 without fat sat and sagittal T2 weighted imaging also without fat saturation.)   Small field-of-view images show that the parametrial extension comes in very close proximity to the RIGHT ureter.   08/30/2020 Initial Diagnosis   Cervical cancer ([place])   08/30/2020 Cancer Staging   Staging form: Cervix Uteri, AJCC Version 9 - Clinical stage from 08/30/2020: Stage IIIC1 (cT2, cN1, cM0) - Signed by Heath Lark, MD on 08/30/2020   09/01/2020 PET scan   1. Large cervical mass extending into the endometrial canal to the level of the uterine fundus, associated with pelvic adenopathy to the level of the RIGHT common iliac chain. Please refer to MRI for further detail. 2. No signs of solid organ uptake or metastatic disease to the chest. 3. Subcutaneous nodule with marked increased metabolic activity, correlation with direct clinical inspection is suggested to exclude cutaneous neoplasm or subcutaneous nodule in this location. Complicated sebaceous cyst could also potentially have this appearance. 4. Uptake in the anal canal could likely physiologic. Given the intense nature of the uptake would  suggest correlation with direct clinical inspection/examination. 5. Uptake in axillary lymph nodes with activity less than mediastinal  blood pool likely small reactive lymph nodes, attention on follow-up. Morphologic features also with benign appearance.   09/16/2020 Procedure   Successful placement of a power injectable Port-A-Cath via the right internal jugular vein. The catheter is ready for immediate use.     09/20/2020 -  Chemotherapy    Patient is on Treatment Plan: CERVICAL CISPLATIN Q7D      Malignant neoplasm of cervix ([place])  09/01/2020 Initial Diagnosis   Malignant neoplasm of cervix ([place])   09/20/2020 -  Chemotherapy    Patient is on Treatment Plan: CERVICAL CISPLATIN Q7D        REVIEW OF SYSTEMS:   Constitutional: Denies fevers, chills or abnormal weight loss Eyes: Denies blurriness of vision Ears, nose, mouth, throat, and face: Denies mucositis or sore throat Respiratory: Denies cough, dyspnea or wheezes Cardiovascular: Denies palpitation, chest discomfort or lower extremity swelling Gastrointestinal:  Denies nausea, heartburn or change in bowel habits Skin: Denies abnormal skin rashes Lymphatics: Denies new lymphadenopathy or easy bruising Neurological:Denies numbness, tingling or new weaknesses Behavioral/Psych: Mood is stable, no new changes  All other systems were reviewed with the patient and are negative.  I have reviewed the past medical history, past surgical history, social history and family history with the patient and they are unchanged from previous note.  ALLERGIES:  has No Known Allergies.  MEDICATIONS:  Current Outpatient Medications  Medication Sig Dispense Refill  . amLODipine (NORVASC) 10 MG tablet Take 1 tablet (10 mg total) by mouth daily. 30 tablet 11  . ezetimibe (ZETIA) 10 MG tablet Take 10 mg by mouth daily.    Marland Kitchen lidocaine-prilocaine (EMLA) cream Apply to affected area once 30 g 3  . magnesium oxide (MAG-OX) 400 (241.3 Mg) MG tablet Take 1 tablet (400 mg total) by mouth 2 (two) times daily. 60 tablet 11  . metFORMIN (GLUCOPHAGE) 1000 MG tablet Take 1,000 mg by mouth 2  (two) times daily with a meal.    . ondansetron (ZOFRAN) 8 MG tablet Take 1 tablet (8 mg total) by mouth every 8 (eight) hours as needed. Start on the third day after cisplatin chemotherapy. 30 tablet 1  . prochlorperazine (COMPAZINE) 10 MG tablet Take 1 tablet (10 mg total) by mouth every 6 (six) hours as needed (Nausea or vomiting). 30 tablet 1  . rosuvastatin (CRESTOR) 40 MG tablet Take 40 mg by mouth daily.    . sitaGLIPtin (JANUVIA) 50 MG tablet Take 50 mg by mouth 2 (two) times daily.     No current facility-administered medications for this visit.    PHYSICAL EXAMINATION: ECOG PERFORMANCE STATUS: 1 - Symptomatic but completely ambulatory  Vitals:   10/15/20 1137  BP: (!) 148/78  Pulse: (!) 109  Resp: 18  Temp: 98.9 F (37.2 C)  SpO2: 100%   Filed Weights   10/15/20 1137  Weight: 207 lb 9.6 oz (94.2 kg)    GENERAL:alert, no distress and comfortable SKIN: skin color, texture, turgor are normal, no rashes or significant lesions EYES: normal, Conjunctiva are pink and non-injected, sclera clear OROPHARYNX:no exudate, no erythema and lips, buccal mucosa, and tongue normal  NECK: supple, thyroid normal size, non-tender, without nodularity LYMPH:  no palpable lymphadenopathy in the cervical, axillary or inguinal LUNGS: clear to auscultation and percussion with normal breathing effort HEART: regular rate & rhythm and no murmurs and no lower extremity edema ABDOMEN:abdomen soft, non-tender and normal bowel sounds  Musculoskeletal:no cyanosis of digits and no clubbing  NEURO: alert & oriented x 3 with fluent speech, no focal motor/sensory deficits  LABORATORY DATA:  I have reviewed the data as listed    Component Value Date/Time   NA 140 10/14/2020 1057   K 3.6 10/14/2020 1057   CL 99 10/14/2020 1057   CO2 28 10/14/2020 1057   GLUCOSE 184 (H) 10/14/2020 1057   BUN 15 10/14/2020 1057   CREATININE 1.05 (H) 10/14/2020 1057   CALCIUM 9.2 10/14/2020 1057   GFRNONAA 58 (L)  10/14/2020 1057    No results found for: SPEP, UPEP  Lab Results  Component Value Date   WBC 4.2 10/14/2020   NEUTROABS 2.9 10/14/2020   HGB 9.7 (L) 10/14/2020   HCT 28.1 (L) 10/14/2020   MCV 81.0 10/14/2020   PLT 149 (L) 10/14/2020      Chemistry      Component Value Date/Time   NA 140 10/14/2020 1057   K 3.6 10/14/2020 1057   CL 99 10/14/2020 1057   CO2 28 10/14/2020 1057   BUN 15 10/14/2020 1057   CREATININE 1.05 (H) 10/14/2020 1057      Component Value Date/Time   CALCIUM 9.2 10/14/2020 1057       RADIOGRAPHIC STUDIES: I have personally reviewed the radiological images as listed and agreed with the findings in the report. IR IMAGING GUIDED PORT INSERTION  Result Date: 09/16/2020 INDICATION: 70 year old female with history of cervical cancer requiring central venous access for chemotherapy. EXAM: IMPLANTED PORT A CATH PLACEMENT WITH ULTRASOUND AND FLUOROSCOPIC GUIDANCE COMPARISON:  None. MEDICATIONS: Ancef 2 gm IV; The antibiotic was administered within an appropriate time interval prior to skin puncture. ANESTHESIA/SEDATION: Moderate (conscious) sedation was employed during this procedure. A total of Versed 3 mg and Fentanyl 100 mcg was administered intravenously. Moderate Sedation Time: 14 minutes. The patient's level of consciousness and vital signs were monitored continuously by radiology nursing throughout the procedure under my direct supervision. CONTRAST:  None FLUOROSCOPY TIME:  0 minutes, 6 seconds (3 mGy) COMPLICATIONS: None immediate. PROCEDURE: The procedure, risks, benefits, and alternatives were explained to the patient. Questions regarding the procedure were encouraged and answered. The patient understands and consents to the procedure. The right neck and chest were prepped with chlorhexidine in a sterile fashion, and a sterile drape was applied covering the operative field. Maximum barrier sterile technique with sterile gowns and gloves were used for the  procedure. A timeout was performed prior to the initiation of the procedure. Ultrasound was used to examine the jugular vein which was compressible and free of internal echoes. A skin marker was used to demarcate the planned venotomy and port pocket incision sites. Local anesthesia was provided to these sites and the subcutaneous tunnel track with 1% lidocaine with 1:100,000 epinephrine. A small incision was created at the jugular access site and blunt dissection was performed of the subcutaneous tissues. Under real time ultrasound guidance, the jugular vein was accessed with a 21 ga micropuncture needle and an 0.018" wire was inserted to the superior vena cava. A 5 Fr micopuncture set was then used, through which a 0.035" Rosen wire was passed under fluoroscopic guidance into the inferior vena cava. An 8 Fr dilator was then placed over the wire. A subcutaneous port pocket was then created along the upper chest wall utilizing a combination of sharp and blunt dissection. The pocket was irrigated with sterile saline, packed with gauze, and observed for hemorrhage. A single lumen "standard sized" power injectable  port was chosen for placement. The 8 Fr catheter was tunneled from the port pocket site to the venotomy incision. The port was placed in the pocket. The external catheter was trimmed to appropriate length. The dilator was exchanged for an 8 Fr peel-away sheath under fluoroscopic guidance. The catheter was then placed through the sheath and the sheath was removed. Final catheter positioning was confirmed and documented with a fluoroscopic spot radiograph. The port was accessed with a Huber needle, aspirated, and flushed with heparinized saline. The deep dermal layer of the port pocket incision was closed with interrupted 3-0 Vicryl suture. Dermabond was then placed over the port pocket and neck incisions. The patient tolerated the procedure well without immediate post procedural complication. FINDINGS: After  catheter placement, the tip lies within the cavoatrial junction. The catheter aspirates and flushes normally and is ready for immediate use. IMPRESSION: Successful placement of a power injectable Port-A-Cath via the right internal jugular vein. The catheter is ready for immediate use. Ruthann Cancer, MD [facility] Electronically Signed   By: Ruthann Cancer MD   On: 09/16/2020 14:59

## 2020-10-16 ENCOUNTER — Ambulatory Visit: Payer: Medicare PPO

## 2020-10-18 ENCOUNTER — Other Ambulatory Visit: Payer: Self-pay | Admitting: Radiation Oncology

## 2020-10-18 ENCOUNTER — Inpatient Hospital Stay: Payer: Medicare PPO

## 2020-10-18 ENCOUNTER — Ambulatory Visit
Admission: RE | Admit: 2020-10-18 | Discharge: 2020-10-18 | Disposition: A | Discharge status: Home / Self Care | Payer: Medicare PPO | Source: Ambulatory Visit | Attending: Radiation Oncology | Admitting: Radiation Oncology

## 2020-10-18 ENCOUNTER — Other Ambulatory Visit (HOSPITAL_COMMUNITY): Payer: Self-pay | Admitting: Radiation Oncology

## 2020-10-18 ENCOUNTER — Other Ambulatory Visit: Payer: Self-pay

## 2020-10-18 VITALS — BP 136/65 | HR 95 | Temp 98.2°F | Resp 18

## 2020-10-18 DIAGNOSIS — C539 Malignant neoplasm of cervix uteri, unspecified: Secondary | ICD-10-CM

## 2020-10-18 DIAGNOSIS — Z510 Encounter for antineoplastic radiation therapy: Secondary | ICD-10-CM | POA: Diagnosis not present

## 2020-10-18 DIAGNOSIS — Z5111 Encounter for antineoplastic chemotherapy: Secondary | ICD-10-CM | POA: Diagnosis not present

## 2020-10-18 MED ORDER — SODIUM CHLORIDE 0.9 % IV SOLN
510.0000 mg | Freq: Once | INTRAVENOUS | Status: AC
  Administered 2020-10-18: 510 mg via INTRAVENOUS
  Filled 2020-10-18: qty 510

## 2020-10-18 MED ORDER — MAGNESIUM SULFATE 2 GM/50ML IV SOLN
6.0000 g | Freq: Once | INTRAVENOUS | Status: AC
  Administered 2020-10-18: 6 g via INTRAVENOUS

## 2020-10-18 MED ORDER — HEPARIN SOD (PORK) LOCK FLUSH 100 UNIT/ML IV SOLN
500.0000 [IU] | Freq: Once | INTRAVENOUS | Status: AC | PRN
  Administered 2020-10-18: 500 [IU]
  Filled 2020-10-18: qty 5

## 2020-10-18 MED ORDER — PALONOSETRON HCL INJECTION 0.25 MG/5ML
INTRAVENOUS | Status: AC
  Filled 2020-10-18: qty 5

## 2020-10-18 MED ORDER — SODIUM CHLORIDE 0.9 % IV SOLN
10.0000 mg | Freq: Once | INTRAVENOUS | Status: AC
  Administered 2020-10-18: 10 mg via INTRAVENOUS
  Filled 2020-10-18: qty 10

## 2020-10-18 MED ORDER — SODIUM CHLORIDE 0.9 % IV SOLN
Freq: Once | INTRAVENOUS | Status: AC
  Filled 2020-10-18: qty 250

## 2020-10-18 MED ORDER — PALONOSETRON HCL INJECTION 0.25 MG/5ML
0.2500 mg | Freq: Once | INTRAVENOUS | Status: AC
  Administered 2020-10-18: 0.25 mg via INTRAVENOUS

## 2020-10-18 MED ORDER — SODIUM CHLORIDE 0.9% FLUSH
10.0000 mL | INTRAVENOUS | Status: DC | PRN
  Administered 2020-10-18: 10 mL
  Filled 2020-10-18: qty 10

## 2020-10-18 MED ORDER — SODIUM CHLORIDE 0.9 % IV SOLN
36.0000 mg/m2 | Freq: Once | INTRAVENOUS | Status: AC
  Administered 2020-10-18: 74 mg via INTRAVENOUS
  Filled 2020-10-18: qty 74

## 2020-10-18 MED ORDER — MAGNESIUM SULFATE 2 GM/50ML IV SOLN
INTRAVENOUS | Status: AC
  Filled 2020-10-18: qty 150

## 2020-10-18 MED ORDER — SODIUM CHLORIDE 0.9 % IV SOLN
150.0000 mg | Freq: Once | INTRAVENOUS | Status: AC
  Administered 2020-10-18: 150 mg via INTRAVENOUS
  Filled 2020-10-18: qty 150

## 2020-10-18 MED ORDER — CYANOCOBALAMIN 1000 MCG/ML IJ SOLN
INTRAMUSCULAR | Status: AC
  Filled 2020-10-18: qty 1

## 2020-10-18 MED ORDER — CYANOCOBALAMIN 1000 MCG/ML IJ SOLN
1000.0000 ug | Freq: Once | INTRAMUSCULAR | Status: AC
  Administered 2020-10-18: 1000 ug via INTRAMUSCULAR

## 2020-10-18 MED ORDER — POTASSIUM CHLORIDE IN NACL 20-0.9 MEQ/L-% IV SOLN
Freq: Once | INTRAVENOUS | Status: AC
  Filled 2020-10-18: qty 1000

## 2020-10-18 NOTE — Patient Instructions (Signed)
[facility] Discharge Instructions for Patients Receiving Chemotherapy  Today you received the following chemotherapy agents Cisplatin  To help prevent nausea and vomiting after your treatment, we encourage you to take your nausea medication as directed   If you develop nausea and vomiting that is not controlled by your nausea medication, call the clinic.   BELOW ARE SYMPTOMS THAT SHOULD BE REPORTED IMMEDIATELY:  *FEVER GREATER THAN 100.5 F  *CHILLS WITH OR WITHOUT FEVER  NAUSEA AND VOMITING THAT IS NOT CONTROLLED WITH YOUR NAUSEA MEDICATION  *UNUSUAL SHORTNESS OF BREATH  *UNUSUAL BRUISING OR BLEEDING  TENDERNESS IN MOUTH AND THROAT WITH OR WITHOUT PRESENCE OF ULCERS  *URINARY PROBLEMS  *BOWEL PROBLEMS  UNUSUAL RASH Items with * indicate a potential emergency and should be followed up as soon as possible.  Feel free to call the clinic should you have any questions or concerns. The clinic phone number is [phone].  Please show the [place] at check-in to the Emergency Department and triage nurse.  Ferumoxytol injection What is this medicine? FERUMOXYTOL is an iron complex. Iron is used to make healthy red blood cells, which carry oxygen and nutrients throughout the body. This medicine is used to treat iron deficiency anemia. This medicine may be used for other purposes; ask your health care provider or pharmacist if you have questions. COMMON BRAND NAME(S): Feraheme What should I tell my health care provider before I take this medicine? They need to know if you have any of these conditions:  anemia not caused by low iron levels  high levels of iron in the blood  magnetic resonance imaging (MRI) test scheduled  an unusual or allergic reaction to iron, other medicines, foods, dyes, or preservatives  pregnant or trying to get pregnant  breast-feeding How should I use this medicine? This medicine is for injection into a vein. It is given by  a health care professional in a hospital or clinic setting. Talk to your pediatrician regarding the use of this medicine in children. Special care may be needed. Overdosage: If you think you have taken too much of this medicine contact a poison control center or emergency room at once. NOTE: This medicine is only for you. Do not share this medicine with others. What if I miss a dose? It is important not to miss your dose. Call your doctor or health care professional if you are unable to keep an appointment. What may interact with this medicine? This medicine may interact with the following medications:  other iron products This list may not describe all possible interactions. Give your health care provider a list of all the medicines, herbs, non-prescription drugs, or dietary supplements you use. Also tell them if you smoke, drink alcohol, or use illegal drugs. Some items may interact with your medicine. What should I watch for while using this medicine? Visit your doctor or healthcare professional regularly. Tell your doctor or healthcare professional if your symptoms do not start to get better or if they get worse. You may need blood work done while you are taking this medicine. You may need to follow a special diet. Talk to your doctor. Foods that contain iron include: whole grains/cereals, dried fruits, beans, or peas, leafy green vegetables, and organ meats (liver, kidney). What side effects may I notice from receiving this medicine? Side effects that you should report to your doctor or health care professional as soon as possible:  allergic reactions like skin rash, itching or hives, swelling of  the face, lips, or tongue  breathing problems  changes in blood pressure  feeling faint or lightheaded, falls  fever or chills  flushing, sweating, or hot feelings  swelling of the ankles or feet Side effects that usually do not require medical attention (report to your doctor or health  care professional if they continue or are bothersome):  diarrhea  headache  nausea, vomiting  stomach pain This list may not describe all possible side effects. Call your doctor for medical advice about side effects. You may report side effects to FDA at 1-800-FDA-1088. Where should I keep my medicine? This drug is given in a hospital or clinic and will not be stored at home. NOTE: This sheet is a summary. It may not cover all possible information. If you have questions about this medicine, talk to your doctor, pharmacist, or health care provider.  2021 Elsevier/Gold Standard (2016-10-16 20:21:10)  Cyanocobalamin, Vitamin B12 injection What is this medicine? CYANOCOBALAMIN (sye an oh koe BAL a min) is a man made form of vitamin B12. Vitamin B12 is used in the growth of healthy blood cells, nerve cells, and proteins in the body. It also helps with the metabolism of fats and carbohydrates. This medicine is used to treat people who can not absorb vitamin B12. This medicine may be used for other purposes; ask your health care provider or pharmacist if you have questions. COMMON BRAND NAME(S): B-12 Compliance Kit, B-12 Injection Kit, Cyomin, LA-12, Nutri-Twelve, Physicians EZ Use B-12, Primabalt What should I tell my health care provider before I take this medicine? They need to know if you have any of these conditions:  kidney disease  Leber's disease  megaloblastic anemia  an unusual or allergic reaction to cyanocobalamin, cobalt, other medicines, foods, dyes, or preservatives  pregnant or trying to get pregnant  breast-feeding How should I use this medicine? This medicine is injected into a muscle or deeply under the skin. It is usually given by a health care professional in a clinic or doctor's office. However, your doctor may teach you how to inject yourself. Follow all instructions. Talk to your pediatrician regarding the use of this medicine in children. Special care may be  needed. Overdosage: If you think you have taken too much of this medicine contact a poison control center or emergency room at once. NOTE: This medicine is only for you. Do not share this medicine with others. What if I miss a dose? If you are given your dose at a clinic or doctor's office, call to reschedule your appointment. If you give your own injections and you miss a dose, take it as soon as you can. If it is almost time for your next dose, take only that dose. Do not take double or extra doses. What may interact with this medicine?  colchicine  heavy alcohol intake This list may not describe all possible interactions. Give your health care provider a list of all the medicines, herbs, non-prescription drugs, or dietary supplements you use. Also tell them if you smoke, drink alcohol, or use illegal drugs. Some items may interact with your medicine. What should I watch for while using this medicine? Visit your doctor or health care professional regularly. You may need blood work done while you are taking this medicine. You may need to follow a special diet. Talk to your doctor. Limit your alcohol intake and avoid smoking to get the best benefit. What side effects may I notice from receiving this medicine? Side effects that you should  report to your doctor or health care professional as soon as possible:  allergic reactions like skin rash, itching or hives, swelling of the face, lips, or tongue  blue tint to skin  chest tightness, pain  difficulty breathing, wheezing  dizziness  red, swollen painful area on the leg Side effects that usually do not require medical attention (report to your doctor or health care professional if they continue or are bothersome):  diarrhea  headache This list may not describe all possible side effects. Call your doctor for medical advice about side effects. You may report side effects to FDA at 1-800-FDA-1088. Where should I keep my medicine? Keep  out of the reach of children. Store at room temperature between 15 and 30 degrees C (59 and 85 degrees F). Protect from light. Throw away any unused medicine after the expiration date. NOTE: This sheet is a summary. It may not cover all possible information. If you have questions about this medicine, talk to your doctor, pharmacist, or health care provider.  2021 Elsevier/Gold Standard (2007-12-09 22:10:20)

## 2020-10-19 ENCOUNTER — Ambulatory Visit
Admission: RE | Admit: 2020-10-19 | Discharge: 2020-10-19 | Disposition: A | Discharge status: Home / Self Care | Payer: Medicare PPO | Source: Ambulatory Visit | Attending: Radiation Oncology | Admitting: Radiation Oncology

## 2020-10-19 ENCOUNTER — Other Ambulatory Visit: Payer: Self-pay

## 2020-10-19 DIAGNOSIS — Z510 Encounter for antineoplastic radiation therapy: Secondary | ICD-10-CM | POA: Diagnosis not present

## 2020-10-20 ENCOUNTER — Other Ambulatory Visit: Payer: Self-pay

## 2020-10-20 ENCOUNTER — Ambulatory Visit
Admission: RE | Admit: 2020-10-20 | Discharge: 2020-10-20 | Disposition: A | Discharge status: Home / Self Care | Payer: Medicare PPO | Source: Ambulatory Visit | Attending: Radiation Oncology | Admitting: Radiation Oncology

## 2020-10-20 DIAGNOSIS — Z510 Encounter for antineoplastic radiation therapy: Secondary | ICD-10-CM | POA: Diagnosis not present

## 2020-10-21 ENCOUNTER — Inpatient Hospital Stay: Payer: Medicare PPO

## 2020-10-21 ENCOUNTER — Other Ambulatory Visit: Payer: Self-pay

## 2020-10-21 ENCOUNTER — Ambulatory Visit
Admission: RE | Admit: 2020-10-21 | Discharge: 2020-10-21 | Disposition: A | Discharge status: Home / Self Care | Payer: Medicare PPO | Source: Ambulatory Visit | Attending: Radiation Oncology | Admitting: Radiation Oncology

## 2020-10-21 DIAGNOSIS — Z510 Encounter for antineoplastic radiation therapy: Secondary | ICD-10-CM | POA: Diagnosis not present

## 2020-10-21 DIAGNOSIS — C539 Malignant neoplasm of cervix uteri, unspecified: Secondary | ICD-10-CM

## 2020-10-21 DIAGNOSIS — Z5111 Encounter for antineoplastic chemotherapy: Secondary | ICD-10-CM | POA: Diagnosis not present

## 2020-10-21 LAB — CBC WITH DIFFERENTIAL (CANCER CENTER ONLY)
Abs Immature Granulocytes: 0 10*3/uL (ref 0.00–0.07)
Basophils Absolute: 0 10*3/uL (ref 0.0–0.1)
Basophils Relative: 0 %
Eosinophils Absolute: 0.1 10*3/uL (ref 0.0–0.5)
Eosinophils Relative: 4 %
HCT: 27 % — ABNORMAL LOW (ref 36.0–46.0)
Hemoglobin: 9.2 g/dL — ABNORMAL LOW (ref 12.0–15.0)
Immature Granulocytes: 0 %
Lymphocytes Relative: 10 %
Lymphs Abs: 0.3 10*3/uL — ABNORMAL LOW (ref 0.7–4.0)
MCH: 27.8 pg (ref 26.0–34.0)
MCHC: 34.1 g/dL (ref 30.0–36.0)
MCV: 81.6 fL (ref 80.0–100.0)
Monocytes Absolute: 0.2 10*3/uL (ref 0.1–1.0)
Monocytes Relative: 8 %
Neutro Abs: 2.2 10*3/uL (ref 1.7–7.7)
Neutrophils Relative %: 78 %
Platelet Count: 114 10*3/uL — ABNORMAL LOW (ref 150–400)
RBC: 3.31 MIL/uL — ABNORMAL LOW (ref 3.87–5.11)
RDW: 15.3 % (ref 11.5–15.5)
WBC Count: 2.8 10*3/uL — ABNORMAL LOW (ref 4.0–10.5)
nRBC: 0 % (ref 0.0–0.2)

## 2020-10-21 LAB — BASIC METABOLIC PANEL - CANCER CENTER ONLY
Anion gap: 11 (ref 5–15)
BUN: 15 mg/dL (ref 8–23)
CO2: 28 mmol/L (ref 22–32)
Calcium: 8.7 mg/dL — ABNORMAL LOW (ref 8.9–10.3)
Chloride: 99 mmol/L (ref 98–111)
Creatinine: 1.02 mg/dL — ABNORMAL HIGH (ref 0.44–1.00)
GFR, Estimated: 60 mL/min — ABNORMAL LOW (ref >60)
Glucose, Bld: 201 mg/dL — ABNORMAL HIGH (ref 70–99)
Potassium: 3.2 mmol/L — ABNORMAL LOW (ref 3.5–5.1)
Sodium: 138 mmol/L (ref 135–145)

## 2020-10-21 LAB — MAGNESIUM: Magnesium: 1.1 mg/dL — ABNORMAL LOW (ref 1.7–2.4)

## 2020-10-21 MED ORDER — SODIUM CHLORIDE 0.9% FLUSH
10.0000 mL | Freq: Once | INTRAVENOUS | Status: AC
  Administered 2020-10-21: 10 mL
  Filled 2020-10-21: qty 10

## 2020-10-21 MED ORDER — HEPARIN SOD (PORK) LOCK FLUSH 100 UNIT/ML IV SOLN
500.0000 [IU] | Freq: Once | INTRAVENOUS | Status: AC
  Administered 2020-10-21: 500 [IU]
  Filled 2020-10-21: qty 5

## 2020-10-22 ENCOUNTER — Other Ambulatory Visit: Payer: Self-pay

## 2020-10-22 ENCOUNTER — Ambulatory Visit
Admission: RE | Admit: 2020-10-22 | Discharge: 2020-10-22 | Disposition: A | Discharge status: Home / Self Care | Payer: Medicare PPO | Source: Ambulatory Visit | Attending: Radiation Oncology | Admitting: Radiation Oncology

## 2020-10-22 ENCOUNTER — Inpatient Hospital Stay: Payer: Medicare PPO | Admitting: Hematology and Oncology

## 2020-10-22 ENCOUNTER — Encounter: Payer: Self-pay | Admitting: Hematology and Oncology

## 2020-10-22 DIAGNOSIS — C539 Malignant neoplasm of cervix uteri, unspecified: Secondary | ICD-10-CM | POA: Diagnosis not present

## 2020-10-22 DIAGNOSIS — N183 Chronic kidney disease, stage 3 unspecified: Secondary | ICD-10-CM | POA: Diagnosis not present

## 2020-10-22 DIAGNOSIS — Z5111 Encounter for antineoplastic chemotherapy: Secondary | ICD-10-CM | POA: Diagnosis not present

## 2020-10-22 DIAGNOSIS — Z510 Encounter for antineoplastic radiation therapy: Secondary | ICD-10-CM | POA: Diagnosis not present

## 2020-10-22 DIAGNOSIS — D539 Nutritional anemia, unspecified: Secondary | ICD-10-CM | POA: Diagnosis not present

## 2020-10-22 DIAGNOSIS — E876 Hypokalemia: Secondary | ICD-10-CM

## 2020-10-22 NOTE — Assessment & Plan Note (Signed)
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

## 2020-10-22 NOTE — Assessment & Plan Note (Signed)
Renal function is stable Observe closely

## 2020-10-22 NOTE — Assessment & Plan Note (Signed)
The most likely cause of her anemia is due to chronic blood loss/malabsorption syndrome. She has received 2 doses of IV iron infusions She will continue vitamin B12 injection next

## 2020-10-22 NOTE — Assessment & Plan Note (Signed)
She has severe hypomagnesemia likely due to side effects of chemo We will continue 6 g of IV magnesium along with oral magnesium twice a day

## 2020-10-22 NOTE — Assessment & Plan Note (Signed)
This is related to her severe hypomagnesemia I gave her instruction sheet for potassium rich diet

## 2020-10-22 NOTE — Progress Notes (Signed)
[place] OFFICE PROGRESS NOTE  Patient Care Team: Alvester Chou, NP as PCP - General (Nurse Practitioner) Jacqulyn Liner, RN as Oncology Nurse Navigator (Oncology)  ASSESSMENT & PLAN:  Cervical cancer [facility]) So far, she tolerated treatment well without major side effects She is noted to have progressive pancytopenia with low magnesium I recommend we pursue final treatment next week with 50% dose adjustment I will see her again next week for further follow-up  Hypomagnesemia She has severe hypomagnesemia likely due to side effects of chemo We will continue 6 g of IV magnesium along with oral magnesium twice a day   Chronic kidney disease (CKD), stage III (moderate) (HCC) Renal function is stable Observe closely   Deficiency anemia The most likely cause of her anemia is due to chronic blood loss/malabsorption syndrome. She has received 2 doses of IV iron infusions She will continue vitamin B12 injection next  Hypokalemia This is related to her severe hypomagnesemia I gave her instruction sheet for potassium rich diet   No orders of the defined types were placed in this encounter.   All questions were answered. The patient knows to call the clinic with any problems, questions or concerns. The total time spent in the appointment was 30 minutes encounter with patients including review of chart and various tests results, discussions about plan of care and coordination of care plan   Heath Lark, MD 10/22/2020 11:18 AM  INTERVAL HISTORY: Please see below for problem oriented charting. She returns for treatment and follow-up She is doing well in terms of tolerance to treatment She denies nausea She has loose stool typically on Wednesdays She denies peripheral neuropathy No recent vaginal bleeding She is compliant taking her medications including magnesium supplement as directed  SUMMARY OF ONCOLOGIC HISTORY: Oncology History  Cervical cancer ([place])  08/13/2020  Pathology Results   Cervical biopsy showed poorly differentiated carcinoma. 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   08/27/2020 Imaging   MRI pelvis Large cervical mass with pelvic adenopathy, at least T2B N1 disease. Question of involvement of posterior urinary bladder (T4) for which additional images have been requested. Patient will return for additional images to answer this question. (Thinner section axial T2 without fat sat and sagittal T2 weighted imaging also without fat saturation.)   Small field-of-view images show that the parametrial extension comes in very close proximity to the RIGHT ureter.   08/30/2020 Initial Diagnosis   Cervical cancer ([place])   08/30/2020 Cancer Staging   Staging form: Cervix Uteri, AJCC Version 9 - Clinical stage from 08/30/2020: Stage IIIC1 (cT2, cN1, cM0) - Signed by Heath Lark, MD on 08/30/2020   09/01/2020 PET scan   1. Large cervical mass extending into the endometrial canal to the level of the uterine fundus, associated with pelvic adenopathy to the level of the RIGHT common iliac chain. Please refer to MRI for further detail. 2. No signs of solid organ uptake or metastatic disease to the chest. 3. Subcutaneous nodule with marked increased metabolic activity, correlation with direct clinical inspection is suggested to exclude cutaneous neoplasm or subcutaneous nodule in this location. Complicated sebaceous cyst could also potentially have this appearance. 4. Uptake in the anal canal could likely physiologic. Given the intense nature of the uptake would suggest correlation with direct clinical inspection/examination. 5. Uptake in axillary lymph nodes with activity less than mediastinal blood pool likely small reactive lymph nodes, attention on follow-up. Morphologic features also with benign appearance.   09/16/2020  Procedure   Successful placement of a power injectable Port-A-Cath via the right internal jugular vein.  The catheter is ready for immediate use.     09/20/2020 -  Chemotherapy    Patient is on Treatment Plan: CERVICAL CISPLATIN Q7D      Malignant neoplasm of cervix ([place])  09/01/2020 Initial Diagnosis   Malignant neoplasm of cervix ([place])   09/20/2020 -  Chemotherapy    Patient is on Treatment Plan: CERVICAL CISPLATIN Q7D        REVIEW OF SYSTEMS:   Constitutional: Denies fevers, chills or abnormal weight loss Eyes: Denies blurriness of vision Ears, nose, mouth, throat, and face: Denies mucositis or sore throat Respiratory: Denies cough, dyspnea or wheezes Cardiovascular: Denies palpitation, chest discomfort or lower extremity swelling Skin: Denies abnormal skin rashes Lymphatics: Denies new lymphadenopathy or easy bruising Neurological:Denies numbness, tingling or new weaknesses Behavioral/Psych: Mood is stable, no new changes  All other systems were reviewed with the patient and are negative.  I have reviewed the past medical history, past surgical history, social history and family history with the patient and they are unchanged from previous note.  ALLERGIES:  has No Known Allergies.  MEDICATIONS:  Current Outpatient Medications  Medication Sig Dispense Refill  . amLODipine (NORVASC) 10 MG tablet Take 1 tablet (10 mg total) by mouth daily. 30 tablet 11  . ezetimibe (ZETIA) 10 MG tablet Take 10 mg by mouth daily.    Marland Kitchen lidocaine-prilocaine (EMLA) cream Apply to affected area once 30 g 3  . magnesium oxide (MAG-OX) 400 (241.3 Mg) MG tablet Take 1 tablet (400 mg total) by mouth 2 (two) times daily. 60 tablet 11  . metFORMIN (GLUCOPHAGE) 1000 MG tablet Take 1,000 mg by mouth 2 (two) times daily with a meal.    . ondansetron (ZOFRAN) 8 MG tablet Take 1 tablet (8 mg total) by mouth every 8 (eight) hours as needed. Start on the third day after cisplatin chemotherapy. 30 tablet 1  . prochlorperazine (COMPAZINE) 10 MG tablet Take 1 tablet (10 mg total) by mouth every 6 (six) hours as  needed (Nausea or vomiting). 30 tablet 1  . rosuvastatin (CRESTOR) 40 MG tablet Take 40 mg by mouth daily.    . sitaGLIPtin (JANUVIA) 50 MG tablet Take 50 mg by mouth 2 (two) times daily.     No current facility-administered medications for this visit.    PHYSICAL EXAMINATION: ECOG PERFORMANCE STATUS: 1 - Symptomatic but completely ambulatory  Vitals:   10/22/20 1018  BP: (!) 142/75  Pulse: (!) 110  Resp: 18  Temp: 98.3 F (36.8 C)  SpO2: 100%   Filed Weights   10/22/20 1018  Weight: 207 lb (93.9 kg)    GENERAL:alert, no distress and comfortable Musculoskeletal:no cyanosis of digits and no clubbing  NEURO: alert & oriented x 3 with fluent speech, no focal motor/sensory deficits  LABORATORY DATA:  I have reviewed the data as listed    Component Value Date/Time   NA 138 10/21/2020 1038   K 3.2 (L) 10/21/2020 1038   CL 99 10/21/2020 1038   CO2 28 10/21/2020 1038   GLUCOSE 201 (H) 10/21/2020 1038   BUN 15 10/21/2020 1038   CREATININE 1.02 (H) 10/21/2020 1038   CALCIUM 8.7 (L) 10/21/2020 1038   GFRNONAA 60 (L) 10/21/2020 1038    No results found for: SPEP, UPEP  Lab Results  Component Value Date   WBC 2.8 (L) 10/21/2020   NEUTROABS 2.2 10/21/2020   HGB 9.2 (L) 10/21/2020  HCT 27.0 (L) 10/21/2020   MCV 81.6 10/21/2020   PLT 114 (L) 10/21/2020      Chemistry      Component Value Date/Time   NA 138 10/21/2020 1038   K 3.2 (L) 10/21/2020 1038   CL 99 10/21/2020 1038   CO2 28 10/21/2020 1038   BUN 15 10/21/2020 1038   CREATININE 1.02 (H) 10/21/2020 1038      Component Value Date/Time   CALCIUM 8.7 (L) 10/21/2020 1038

## 2020-10-25 ENCOUNTER — Other Ambulatory Visit: Payer: Self-pay

## 2020-10-25 ENCOUNTER — Ambulatory Visit
Admission: RE | Admit: 2020-10-25 | Discharge: 2020-10-25 | Disposition: A | Discharge status: Home / Self Care | Payer: Medicare PPO | Source: Ambulatory Visit | Attending: Radiation Oncology | Admitting: Radiation Oncology

## 2020-10-25 ENCOUNTER — Inpatient Hospital Stay: Payer: Medicare PPO

## 2020-10-25 VITALS — BP 134/79 | HR 106 | Temp 98.1°F | Resp 20

## 2020-10-25 DIAGNOSIS — Z5111 Encounter for antineoplastic chemotherapy: Secondary | ICD-10-CM | POA: Diagnosis not present

## 2020-10-25 DIAGNOSIS — C539 Malignant neoplasm of cervix uteri, unspecified: Secondary | ICD-10-CM

## 2020-10-25 DIAGNOSIS — Z510 Encounter for antineoplastic radiation therapy: Secondary | ICD-10-CM | POA: Diagnosis not present

## 2020-10-25 MED ORDER — POTASSIUM CHLORIDE IN NACL 20-0.9 MEQ/L-% IV SOLN
Freq: Once | INTRAVENOUS | Status: AC
  Filled 2020-10-25: qty 1000

## 2020-10-25 MED ORDER — MAGNESIUM SULFATE 2 GM/50ML IV SOLN
INTRAVENOUS | Status: AC
  Filled 2020-10-25: qty 150

## 2020-10-25 MED ORDER — PALONOSETRON HCL INJECTION 0.25 MG/5ML
INTRAVENOUS | Status: AC
  Filled 2020-10-25: qty 5

## 2020-10-25 MED ORDER — MAGNESIUM SULFATE 2 GM/50ML IV SOLN
6.0000 g | Freq: Once | INTRAVENOUS | Status: AC
  Administered 2020-10-25: 6 g via INTRAVENOUS

## 2020-10-25 MED ORDER — SODIUM CHLORIDE 0.9 % IV SOLN
20.0000 mg/m2 | Freq: Once | INTRAVENOUS | Status: AC
  Administered 2020-10-25: 41 mg via INTRAVENOUS
  Filled 2020-10-25: qty 41

## 2020-10-25 MED ORDER — CYANOCOBALAMIN 1000 MCG/ML IJ SOLN
INTRAMUSCULAR | Status: AC
  Filled 2020-10-25: qty 1

## 2020-10-25 MED ORDER — SODIUM CHLORIDE 0.9 % IV SOLN
10.0000 mg | Freq: Once | INTRAVENOUS | Status: AC
  Administered 2020-10-25: 10 mg via INTRAVENOUS
  Filled 2020-10-25: qty 10

## 2020-10-25 MED ORDER — HEPARIN SOD (PORK) LOCK FLUSH 100 UNIT/ML IV SOLN
500.0000 [IU] | Freq: Once | INTRAVENOUS | Status: AC | PRN
  Administered 2020-10-25: 500 [IU]
  Filled 2020-10-25: qty 5

## 2020-10-25 MED ORDER — SODIUM CHLORIDE 0.9 % IV SOLN
150.0000 mg | Freq: Once | INTRAVENOUS | Status: AC
  Administered 2020-10-25: 150 mg via INTRAVENOUS
  Filled 2020-10-25: qty 150

## 2020-10-25 MED ORDER — CYANOCOBALAMIN 1000 MCG/ML IJ SOLN
1000.0000 ug | Freq: Once | INTRAMUSCULAR | Status: AC
  Administered 2020-10-25: 1000 ug via INTRAMUSCULAR

## 2020-10-25 MED ORDER — PALONOSETRON HCL INJECTION 0.25 MG/5ML
0.2500 mg | Freq: Once | INTRAVENOUS | Status: AC
  Administered 2020-10-25: 0.25 mg via INTRAVENOUS

## 2020-10-25 MED ORDER — SODIUM CHLORIDE 0.9 % IV SOLN
Freq: Once | INTRAVENOUS | Status: AC
Start: 2020-10-25 — End: 2020-10-25
  Filled 2020-10-25: qty 250

## 2020-10-25 MED ORDER — SODIUM CHLORIDE 0.9% FLUSH
10.0000 mL | INTRAVENOUS | Status: DC | PRN
  Administered 2020-10-25: 10 mL
  Filled 2020-10-25: qty 10

## 2020-10-25 NOTE — Patient Instructions (Signed)
[facility] Discharge Instructions for Patients Receiving Chemotherapy  Today you received the following chemotherapy agents Cisplatin  To help prevent nausea and vomiting after your treatment, we encourage you to take your nausea medication as directed   If you develop nausea and vomiting that is not controlled by your nausea medication, call the clinic.   BELOW ARE SYMPTOMS THAT SHOULD BE REPORTED IMMEDIATELY:  *FEVER GREATER THAN 100.5 F  *CHILLS WITH OR WITHOUT FEVER  NAUSEA AND VOMITING THAT IS NOT CONTROLLED WITH YOUR NAUSEA MEDICATION  *UNUSUAL SHORTNESS OF BREATH  *UNUSUAL BRUISING OR BLEEDING  TENDERNESS IN MOUTH AND THROAT WITH OR WITHOUT PRESENCE OF ULCERS  *URINARY PROBLEMS  *BOWEL PROBLEMS  UNUSUAL RASH Items with * indicate a potential emergency and should be followed up as soon as possible.  Feel free to call the clinic should you have any questions or concerns. The clinic phone number is [phone].  Please show the [place] at check-in to the Emergency Department and triage nurse.  Ferumoxytol injection What is this medicine? FERUMOXYTOL is an iron complex. Iron is used to make healthy red blood cells, which carry oxygen and nutrients throughout the body. This medicine is used to treat iron deficiency anemia. This medicine may be used for other purposes; ask your health care provider or pharmacist if you have questions. COMMON BRAND NAME(S): Feraheme What should I tell my health care provider before I take this medicine? They need to know if you have any of these conditions:  anemia not caused by low iron levels  high levels of iron in the blood  magnetic resonance imaging (MRI) test scheduled  an unusual or allergic reaction to iron, other medicines, foods, dyes, or preservatives  pregnant or trying to get pregnant  breast-feeding How should I use this medicine? This medicine is for injection into a vein. It is given by  a health care professional in a hospital or clinic setting. Talk to your pediatrician regarding the use of this medicine in children. Special care may be needed. Overdosage: If you think you have taken too much of this medicine contact a poison control center or emergency room at once. NOTE: This medicine is only for you. Do not share this medicine with others. What if I miss a dose? It is important not to miss your dose. Call your doctor or health care professional if you are unable to keep an appointment. What may interact with this medicine? This medicine may interact with the following medications:  other iron products This list may not describe all possible interactions. Give your health care provider a list of all the medicines, herbs, non-prescription drugs, or dietary supplements you use. Also tell them if you smoke, drink alcohol, or use illegal drugs. Some items may interact with your medicine. What should I watch for while using this medicine? Visit your doctor or healthcare professional regularly. Tell your doctor or healthcare professional if your symptoms do not start to get better or if they get worse. You may need blood work done while you are taking this medicine. You may need to follow a special diet. Talk to your doctor. Foods that contain iron include: whole grains/cereals, dried fruits, beans, or peas, leafy green vegetables, and organ meats (liver, kidney). What side effects may I notice from receiving this medicine? Side effects that you should report to your doctor or health care professional as soon as possible:  allergic reactions like skin rash, itching or hives, swelling of  the face, lips, or tongue  breathing problems  changes in blood pressure  feeling faint or lightheaded, falls  fever or chills  flushing, sweating, or hot feelings  swelling of the ankles or feet Side effects that usually do not require medical attention (report to your doctor or health  care professional if they continue or are bothersome):  diarrhea  headache  nausea, vomiting  stomach pain This list may not describe all possible side effects. Call your doctor for medical advice about side effects. You may report side effects to FDA at 1-800-FDA-1088. Where should I keep my medicine? This drug is given in a hospital or clinic and will not be stored at home. NOTE: This sheet is a summary. It may not cover all possible information. If you have questions about this medicine, talk to your doctor, pharmacist, or health care provider.  2021 Elsevier/Gold Standard (2016-10-16 20:21:10)  Cyanocobalamin, Vitamin B12 injection What is this medicine? CYANOCOBALAMIN (sye an oh koe BAL a min) is a man made form of vitamin B12. Vitamin B12 is used in the growth of healthy blood cells, nerve cells, and proteins in the body. It also helps with the metabolism of fats and carbohydrates. This medicine is used to treat people who can not absorb vitamin B12. This medicine may be used for other purposes; ask your health care provider or pharmacist if you have questions. COMMON BRAND NAME(S): B-12 Compliance Kit, B-12 Injection Kit, Cyomin, LA-12, Nutri-Twelve, Physicians EZ Use B-12, Primabalt What should I tell my health care provider before I take this medicine? They need to know if you have any of these conditions:  kidney disease  Leber's disease  megaloblastic anemia  an unusual or allergic reaction to cyanocobalamin, cobalt, other medicines, foods, dyes, or preservatives  pregnant or trying to get pregnant  breast-feeding How should I use this medicine? This medicine is injected into a muscle or deeply under the skin. It is usually given by a health care professional in a clinic or doctor's office. However, your doctor may teach you how to inject yourself. Follow all instructions. Talk to your pediatrician regarding the use of this medicine in children. Special care may be  needed. Overdosage: If you think you have taken too much of this medicine contact a poison control center or emergency room at once. NOTE: This medicine is only for you. Do not share this medicine with others. What if I miss a dose? If you are given your dose at a clinic or doctor's office, call to reschedule your appointment. If you give your own injections and you miss a dose, take it as soon as you can. If it is almost time for your next dose, take only that dose. Do not take double or extra doses. What may interact with this medicine?  colchicine  heavy alcohol intake This list may not describe all possible interactions. Give your health care provider a list of all the medicines, herbs, non-prescription drugs, or dietary supplements you use. Also tell them if you smoke, drink alcohol, or use illegal drugs. Some items may interact with your medicine. What should I watch for while using this medicine? Visit your doctor or health care professional regularly. You may need blood work done while you are taking this medicine. You may need to follow a special diet. Talk to your doctor. Limit your alcohol intake and avoid smoking to get the best benefit. What side effects may I notice from receiving this medicine? Side effects that you should  report to your doctor or health care professional as soon as possible:  allergic reactions like skin rash, itching or hives, swelling of the face, lips, or tongue  blue tint to skin  chest tightness, pain  difficulty breathing, wheezing  dizziness  red, swollen painful area on the leg Side effects that usually do not require medical attention (report to your doctor or health care professional if they continue or are bothersome):  diarrhea  headache This list may not describe all possible side effects. Call your doctor for medical advice about side effects. You may report side effects to FDA at 1-800-FDA-1088. Where should I keep my medicine? Keep  out of the reach of children. Store at room temperature between 15 and 30 degrees C (59 and 85 degrees F). Protect from light. Throw away any unused medicine after the expiration date. NOTE: This sheet is a summary. It may not cover all possible information. If you have questions about this medicine, talk to your doctor, pharmacist, or health care provider.  2021 Elsevier/Gold Standard (2007-12-09 22:10:20)

## 2020-10-25 NOTE — Progress Notes (Signed)
Okay to treat today with heart rate of 106-110, per Dr. Alvy Bimler.

## 2020-10-26 ENCOUNTER — Ambulatory Visit
Admission: RE | Admit: 2020-10-26 | Discharge: 2020-10-26 | Disposition: A | Discharge status: Home / Self Care | Payer: Medicare PPO | Source: Ambulatory Visit | Attending: Radiation Oncology | Admitting: Radiation Oncology

## 2020-10-26 ENCOUNTER — Other Ambulatory Visit: Payer: Self-pay

## 2020-10-26 DIAGNOSIS — Z510 Encounter for antineoplastic radiation therapy: Secondary | ICD-10-CM | POA: Diagnosis not present

## 2020-10-27 ENCOUNTER — Other Ambulatory Visit: Payer: Self-pay

## 2020-10-27 ENCOUNTER — Ambulatory Visit
Admission: RE | Admit: 2020-10-27 | Discharge: 2020-10-27 | Disposition: A | Discharge status: Home / Self Care | Payer: Medicare PPO | Source: Ambulatory Visit | Attending: Radiation Oncology | Admitting: Radiation Oncology

## 2020-10-27 ENCOUNTER — Ambulatory Visit: Payer: Medicare PPO

## 2020-10-27 DIAGNOSIS — Z510 Encounter for antineoplastic radiation therapy: Secondary | ICD-10-CM | POA: Diagnosis not present

## 2020-10-28 ENCOUNTER — Other Ambulatory Visit: Payer: Self-pay

## 2020-10-28 ENCOUNTER — Ambulatory Visit: Payer: Medicare PPO

## 2020-10-28 ENCOUNTER — Ambulatory Visit
Admission: RE | Admit: 2020-10-28 | Discharge: 2020-10-28 | Disposition: A | Discharge status: Home / Self Care | Payer: Medicare PPO | Source: Ambulatory Visit | Attending: Radiation Oncology | Admitting: Radiation Oncology

## 2020-10-28 ENCOUNTER — Inpatient Hospital Stay: Payer: Medicare PPO

## 2020-10-28 ENCOUNTER — Ambulatory Visit: Payer: Medicare PPO | Admitting: Radiation Oncology

## 2020-10-28 DIAGNOSIS — Z5111 Encounter for antineoplastic chemotherapy: Secondary | ICD-10-CM | POA: Diagnosis not present

## 2020-10-28 DIAGNOSIS — C539 Malignant neoplasm of cervix uteri, unspecified: Secondary | ICD-10-CM

## 2020-10-28 DIAGNOSIS — Z510 Encounter for antineoplastic radiation therapy: Secondary | ICD-10-CM | POA: Diagnosis not present

## 2020-10-28 LAB — CBC WITH DIFFERENTIAL (CANCER CENTER ONLY)
Abs Immature Granulocytes: 0.01 10*3/uL (ref 0.00–0.07)
Basophils Absolute: 0 10*3/uL (ref 0.0–0.1)
Basophils Relative: 0 %
Eosinophils Absolute: 0.1 10*3/uL (ref 0.0–0.5)
Eosinophils Relative: 4 %
HCT: 26.7 % — ABNORMAL LOW (ref 36.0–46.0)
Hemoglobin: 9.3 g/dL — ABNORMAL LOW (ref 12.0–15.0)
Immature Granulocytes: 0 %
Lymphocytes Relative: 11 %
Lymphs Abs: 0.3 10*3/uL — ABNORMAL LOW (ref 0.7–4.0)
MCH: 28.2 pg (ref 26.0–34.0)
MCHC: 34.8 g/dL (ref 30.0–36.0)
MCV: 80.9 fL (ref 80.0–100.0)
Monocytes Absolute: 0.2 10*3/uL (ref 0.1–1.0)
Monocytes Relative: 11 %
Neutro Abs: 1.7 10*3/uL (ref 1.7–7.7)
Neutrophils Relative %: 74 %
Platelet Count: 153 10*3/uL (ref 150–400)
RBC: 3.3 MIL/uL — ABNORMAL LOW (ref 3.87–5.11)
RDW: 16.8 % — ABNORMAL HIGH (ref 11.5–15.5)
WBC Count: 2.3 10*3/uL — ABNORMAL LOW (ref 4.0–10.5)
nRBC: 0 % (ref 0.0–0.2)

## 2020-10-28 LAB — BASIC METABOLIC PANEL - CANCER CENTER ONLY
Anion gap: 11 (ref 5–15)
BUN: 11 mg/dL (ref 8–23)
CO2: 28 mmol/L (ref 22–32)
Calcium: 8.6 mg/dL — ABNORMAL LOW (ref 8.9–10.3)
Chloride: 99 mmol/L (ref 98–111)
Creatinine: 1.01 mg/dL — ABNORMAL HIGH (ref 0.44–1.00)
GFR, Estimated: >60 mL/min (ref >60)
Glucose, Bld: 194 mg/dL — ABNORMAL HIGH (ref 70–99)
Potassium: 3.7 mmol/L (ref 3.5–5.1)
Sodium: 138 mmol/L (ref 135–145)

## 2020-10-28 LAB — MAGNESIUM: Magnesium: 1.1 mg/dL — ABNORMAL LOW (ref 1.7–2.4)

## 2020-10-28 MED ORDER — HEPARIN SOD (PORK) LOCK FLUSH 100 UNIT/ML IV SOLN
500.0000 [IU] | Freq: Once | INTRAVENOUS | Status: AC
  Administered 2020-10-28: 500 [IU]
  Filled 2020-10-28: qty 5

## 2020-10-28 MED ORDER — SODIUM CHLORIDE 0.9% FLUSH
10.0000 mL | Freq: Once | INTRAVENOUS | Status: AC
  Administered 2020-10-28: 10 mL
  Filled 2020-10-28: qty 10

## 2020-10-28 NOTE — Patient Instructions (Signed)
Implanted Port Insertion, Care After This sheet gives you information about how to care for yourself after your procedure. Your health care provider may also give you more specific instructions. If you have problems or questions, contact your health care provider. What can I expect after the procedure? After the procedure, it is common to have:  Discomfort at the port insertion site.  Bruising on the skin over the port. This should improve over 3-4 days. Follow these instructions at home: Port care  After your port is placed, you will get a manufacturer's information card. The card has information about your port. Keep this card with you at all times.  Take care of the port as told by your health care provider. Ask your health care provider if you or a family member can get training for taking care of the port at home. A home health care nurse may also take care of the port.  Make sure to remember what type of port you have. Incision care  Follow instructions from your health care provider about how to take care of your port insertion site. Make sure you: ? Wash your hands with soap and water before and after you change your bandage (dressing). If soap and water are not available, use hand sanitizer. ? Change your dressing as told by your health care provider. ? Leave stitches (sutures), skin glue, or adhesive strips in place. These skin closures may need to stay in place for 2 weeks or longer. If adhesive strip edges start to loosen and curl up, you may trim the loose edges. Do not remove adhesive strips completely unless your health care provider tells you to do that.  Check your port insertion site every day for signs of infection. Check for: ? Redness, swelling, or pain. ? Fluid or blood. ? Warmth. ? Pus or a bad smell.      Activity  Return to your normal activities as told by your health care provider. Ask your health care provider what activities are safe for you.  Do not  lift anything that is heavier than 10 lb (4.5 kg), or the limit that you are told, until your health care provider says that it is safe. General instructions  Take over-the-counter and prescription medicines only as told by your health care provider.  Do not take baths, swim, or use a hot tub until your health care provider approves. Ask your health care provider if you may take showers. You may only be allowed to take sponge baths.  Do not drive for 24 hours if you were given a sedative during your procedure.  Wear a medical alert bracelet in case of an emergency. This will tell any health care providers that you have a port.  Keep all follow-up visits as told by your health care provider. This is important. Contact a health care provider if:  You cannot flush your port with saline as directed, or you cannot draw blood from the port.  You have a fever or chills.  You have redness, swelling, or pain around your port insertion site.  You have fluid or blood coming from your port insertion site.  Your port insertion site feels warm to the touch.  You have pus or a bad smell coming from the port insertion site. Get help right away if:  You have chest pain or shortness of breath.  You have bleeding from your port that you cannot control. Summary  Take care of the port as told by your   health care provider. Keep the manufacturer's information card with you at all times.  Change your dressing as told by your health care provider.  Contact a health care provider if you have a fever or chills or if you have redness, swelling, or pain around your port insertion site.  Keep all follow-up visits as told by your health care provider. This information is not intended to replace advice given to you by your health care provider. Make sure you discuss any questions you have with your health care provider. Document Revised: 03/26/2018 Document Reviewed: 03/26/2018 Elsevier Patient Education   2021 Elsevier Inc.  

## 2020-10-29 ENCOUNTER — Other Ambulatory Visit: Payer: Self-pay

## 2020-10-29 ENCOUNTER — Ambulatory Visit: Payer: Medicare PPO

## 2020-10-29 ENCOUNTER — Inpatient Hospital Stay: Payer: Medicare PPO | Admitting: Hematology and Oncology

## 2020-10-29 ENCOUNTER — Ambulatory Visit
Admission: RE | Admit: 2020-10-29 | Discharge: 2020-10-29 | Disposition: A | Discharge status: Home / Self Care | Payer: Medicare PPO | Source: Ambulatory Visit | Attending: Radiation Oncology | Admitting: Radiation Oncology

## 2020-10-29 ENCOUNTER — Encounter: Payer: Self-pay | Admitting: Hematology and Oncology

## 2020-10-29 DIAGNOSIS — C539 Malignant neoplasm of cervix uteri, unspecified: Secondary | ICD-10-CM

## 2020-10-29 DIAGNOSIS — Z5111 Encounter for antineoplastic chemotherapy: Secondary | ICD-10-CM | POA: Diagnosis not present

## 2020-10-29 DIAGNOSIS — N183 Chronic kidney disease, stage 3 unspecified: Secondary | ICD-10-CM

## 2020-10-29 DIAGNOSIS — D539 Nutritional anemia, unspecified: Secondary | ICD-10-CM

## 2020-10-29 DIAGNOSIS — Z510 Encounter for antineoplastic radiation therapy: Secondary | ICD-10-CM | POA: Diagnosis not present

## 2020-10-29 MED ORDER — LIDOCAINE-PRILOCAINE 2.5-2.5 % EX CREA: TOPICAL_CREAM | CUTANEOUS | 3 refills | Status: DC

## 2020-10-29 NOTE — Assessment & Plan Note (Signed)
She has severe hypomagnesemia likely due to side effects of chemo She is not symptomatic She will continue chronic magnesium replacement therapy Hopefully, with discontinuation of chemotherapy, her magnesium level will improve in the future

## 2020-10-29 NOTE — Assessment & Plan Note (Signed)
She tolerated last cycle of treatment well, complicated by mild pancytopenia of which she is not symptomatic She will proceed with radiation only moving forward I plan to see her again in 3 months for further follow-up I will order PET CT scan when I see her in May, to be done approximately 3 months away from her last radiation treatment

## 2020-10-29 NOTE — Progress Notes (Signed)
[place] OFFICE PROGRESS NOTE  Patient Care Team: Alvester Chou, NP as PCP - General (Nurse Practitioner) Jacqulyn Liner, RN as Oncology Nurse Navigator (Oncology)  ASSESSMENT & PLAN:  Cervical cancer [facility]) She tolerated last cycle of treatment well, complicated by mild pancytopenia of which she is not symptomatic She will proceed with radiation only moving forward I plan to see her again in 3 months for further follow-up I will order PET CT scan when I see her in May, to be done approximately 3 months away from her last radiation treatment  Deficiency anemia The cause of her anemia is multifactorial She has received IV iron and vitamin B12 injection She will get another B12 injection in May I will recheck her iron studies and B12 level in her next visit She is not symptomatic and does not need transfusion support  Chronic kidney disease (CKD), stage III (moderate) (HCC) Renal function is stable Observe closely   Hypomagnesemia She has severe hypomagnesemia likely due to side effects of chemo She is not symptomatic She will continue chronic magnesium replacement therapy Hopefully, with discontinuation of chemotherapy, her magnesium level will improve in the future   Orders Placed This Encounter  Procedures  . Iron and TIBC    Standing Status:   Standing    Number of Occurrences:   2    Standing Expiration Date:   10/29/2021  . Ferritin    Standing Status:   Standing    Number of Occurrences:   2    Standing Expiration Date:   10/29/2021  . Vitamin B12    Standing Status:   Standing    Number of Occurrences:   2    Standing Expiration Date:   10/29/2021    All questions were answered. The patient knows to call the clinic with any problems, questions or concerns. The total time spent in the appointment was 20 minutes encounter with patients including review of chart and various tests results, discussions about plan of care and coordination of care plan    Heath Lark, MD 10/29/2020 12:36 PM  INTERVAL HISTORY: Please see below for problem oriented charting. She returns for follow-up She completed her chemotherapy recently She feels well Denies recent neuropathy No recent nausea She has no recent vaginal bleeding  SUMMARY OF ONCOLOGIC HISTORY: Oncology History  Cervical cancer ([place])  08/13/2020 Pathology Results   Cervical biopsy showed poorly differentiated carcinoma. 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   08/27/2020 Imaging   MRI pelvis Large cervical mass with pelvic adenopathy, at least T2B N1 disease. Question of involvement of posterior urinary bladder (T4) for which additional images have been requested. Patient will return for additional images to answer this question. (Thinner section axial T2 without fat sat and sagittal T2 weighted imaging also without fat saturation.)   Small field-of-view images show that the parametrial extension comes in very close proximity to the RIGHT ureter.   08/30/2020 Initial Diagnosis   Cervical cancer ([place])   08/30/2020 Cancer Staging   Staging form: Cervix Uteri, AJCC Version 9 - Clinical stage from 08/30/2020: Stage IIIC1 (cT2, cN1, cM0) - Signed by Heath Lark, MD on 08/30/2020   09/01/2020 PET scan   1. Large cervical mass extending into the endometrial canal to the level of the uterine fundus, associated with pelvic adenopathy to the level of the RIGHT common iliac chain. Please refer to MRI for further detail. 2. No signs of solid organ uptake or metastatic  disease to the chest. 3. Subcutaneous nodule with marked increased metabolic activity, correlation with direct clinical inspection is suggested to exclude cutaneous neoplasm or subcutaneous nodule in this location. Complicated sebaceous cyst could also potentially have this appearance. 4. Uptake in the anal canal could likely physiologic. Given the intense nature of the uptake would suggest  correlation with direct clinical inspection/examination. 5. Uptake in axillary lymph nodes with activity less than mediastinal blood pool likely small reactive lymph nodes, attention on follow-up. Morphologic features also with benign appearance.   09/16/2020 Procedure   Successful placement of a power injectable Port-A-Cath via the right internal jugular vein. The catheter is ready for immediate use.     09/20/2020 -  Chemotherapy    Patient is on Treatment Plan: CERVICAL CISPLATIN Q7D      Malignant neoplasm of cervix ([place])  09/01/2020 Initial Diagnosis   Malignant neoplasm of cervix ([place])   09/20/2020 -  Chemotherapy    Patient is on Treatment Plan: CERVICAL CISPLATIN Q7D        REVIEW OF SYSTEMS:   Constitutional: Denies fevers, chills or abnormal weight loss Eyes: Denies blurriness of vision Ears, nose, mouth, throat, and face: Denies mucositis or sore throat Respiratory: Denies cough, dyspnea or wheezes Cardiovascular: Denies palpitation, chest discomfort or lower extremity swelling Gastrointestinal:  Denies nausea, heartburn or change in bowel habits Skin: Denies abnormal skin rashes Lymphatics: Denies new lymphadenopathy or easy bruising Neurological:Denies numbness, tingling or new weaknesses Behavioral/Psych: Mood is stable, no new changes  All other systems were reviewed with the patient and are negative.  I have reviewed the past medical history, past surgical history, social history and family history with the patient and they are unchanged from previous note.  ALLERGIES:  has No Known Allergies.  MEDICATIONS:  Current Outpatient Medications  Medication Sig Dispense Refill  . amLODipine (NORVASC) 10 MG tablet Take 1 tablet (10 mg total) by mouth daily. 30 tablet 11  . ezetimibe (ZETIA) 10 MG tablet Take 10 mg by mouth daily.    Marland Kitchen lidocaine-prilocaine (EMLA) cream Apply to affected area once 30 g 3  . magnesium oxide (MAG-OX) 400 (241.3 Mg) MG tablet Take 1  tablet (400 mg total) by mouth 2 (two) times daily. 60 tablet 11  . metFORMIN (GLUCOPHAGE) 1000 MG tablet Take 1,000 mg by mouth 2 (two) times daily with a meal.    . ondansetron (ZOFRAN) 8 MG tablet Take 1 tablet (8 mg total) by mouth every 8 (eight) hours as needed. Start on the third day after cisplatin chemotherapy. 30 tablet 1  . prochlorperazine (COMPAZINE) 10 MG tablet Take 1 tablet (10 mg total) by mouth every 6 (six) hours as needed (Nausea or vomiting). 30 tablet 1  . rosuvastatin (CRESTOR) 40 MG tablet Take 40 mg by mouth daily.    . sitaGLIPtin (JANUVIA) 50 MG tablet Take 50 mg by mouth 2 (two) times daily.     No current facility-administered medications for this visit.    PHYSICAL EXAMINATION: ECOG PERFORMANCE STATUS: 1 - Symptomatic but completely ambulatory  Vitals:   10/29/20 1003  BP: (!) 146/90  Pulse: (!) 115  Resp: 18  Temp: (!) 97.4 F (36.3 C)  SpO2: 100%   Filed Weights   10/29/20 1003  Weight: 207 lb 12.8 oz (94.3 kg)    GENERAL:alert, no distress and comfortable NEURO: alert & oriented x 3 with fluent speech, no focal motor/sensory deficits  LABORATORY DATA:  I have reviewed the data as listed  Component Value Date/Time   NA 138 10/28/2020 1046   K 3.7 10/28/2020 1046   CL 99 10/28/2020 1046   CO2 28 10/28/2020 1046   GLUCOSE 194 (H) 10/28/2020 1046   BUN 11 10/28/2020 1046   CREATININE 1.01 (H) 10/28/2020 1046   CALCIUM 8.6 (L) 10/28/2020 1046   GFRNONAA >60 10/28/2020 1046    No results found for: SPEP, UPEP  Lab Results  Component Value Date   WBC 2.3 (L) 10/28/2020   NEUTROABS 1.7 10/28/2020   HGB 9.3 (L) 10/28/2020   HCT 26.7 (L) 10/28/2020   MCV 80.9 10/28/2020   PLT 153 10/28/2020      Chemistry      Component Value Date/Time   NA 138 10/28/2020 1046   K 3.7 10/28/2020 1046   CL 99 10/28/2020 1046   CO2 28 10/28/2020 1046   BUN 11 10/28/2020 1046   CREATININE 1.01 (H) 10/28/2020 1046      Component Value Date/Time    CALCIUM 8.6 (L) 10/28/2020 1046

## 2020-10-29 NOTE — Assessment & Plan Note (Signed)
Renal function is stable Observe closely

## 2020-10-29 NOTE — Assessment & Plan Note (Signed)
The cause of her anemia is multifactorial She has received IV iron and vitamin B12 injection She will get another B12 injection in May I will recheck her iron studies and B12 level in her next visit She is not symptomatic and does not need transfusion support

## 2020-11-01 ENCOUNTER — Ambulatory Visit
Admission: RE | Admit: 2020-11-01 | Discharge: 2020-11-01 | Disposition: A | Discharge status: Home / Self Care | Payer: Medicare PPO | Source: Ambulatory Visit | Attending: Radiation Oncology | Admitting: Radiation Oncology

## 2020-11-01 ENCOUNTER — Other Ambulatory Visit: Payer: Self-pay

## 2020-11-01 ENCOUNTER — Telehealth: Payer: Self-pay | Admitting: Hematology and Oncology

## 2020-11-01 DIAGNOSIS — Z510 Encounter for antineoplastic radiation therapy: Secondary | ICD-10-CM | POA: Diagnosis not present

## 2020-11-01 NOTE — Telephone Encounter (Signed)
Called pt per 2/18 sch msg - pt is aware of appt date and time

## 2020-11-02 ENCOUNTER — Ambulatory Visit: Payer: Medicare PPO

## 2020-11-03 ENCOUNTER — Ambulatory Visit: Payer: Medicare PPO

## 2020-11-03 ENCOUNTER — Encounter (HOSPITAL_BASED_OUTPATIENT_CLINIC_OR_DEPARTMENT_OTHER): Payer: Self-pay | Admitting: Radiation Oncology

## 2020-11-04 ENCOUNTER — Other Ambulatory Visit: Payer: Medicare PPO

## 2020-11-04 ENCOUNTER — Encounter (HOSPITAL_BASED_OUTPATIENT_CLINIC_OR_DEPARTMENT_OTHER): Payer: Self-pay | Admitting: Radiation Oncology

## 2020-11-04 ENCOUNTER — Ambulatory Visit: Payer: Medicare PPO

## 2020-11-04 ENCOUNTER — Other Ambulatory Visit: Payer: Self-pay

## 2020-11-04 NOTE — Progress Notes (Addendum)
Addendum:  Plan to have IV therapy team to access pt's port-a-cath for surgery tomorrow 11-09-2020 since Dr Sondra Come had already written order that is was ok to use.  I have left message for Dr Sondra Come and his nurse voicemail to let them know that per policy port-a-cath's are accessed by IV therapy team and this has been arranged for tomorrow. But after pt's radiation treatment at cancer center they will need to arrange to have it flushed.  Spoke w/ via phone for pre-op interview--- PT Lab needs dos---- Istat and EKG              Lab results------ no COVID test ------ 11-08-2020 @ 0805 Arrive at ------- [id number] on 11-09-2020 NPO after MN NO Solid Food.  Clear liquids from MN until--- 0545 Medications to take morning of surgery ----- Norvasc Diabetic medication ----- do not take metformin/ januvia morning of surgery Patient Special Instructions ----- n/a Pre-Op special Istructions ----- this is pt's first tandem procedure, reviewed the process for this and the next four procedures scheduled Patient verbalized understanding of instructions that were given at this phone interview. Patient denies shortness of breath, chest pain, fever, cough at this phone interview.

## 2020-11-05 ENCOUNTER — Ambulatory Visit: Payer: Medicare PPO | Admitting: Hematology and Oncology

## 2020-11-08 ENCOUNTER — Other Ambulatory Visit (HOSPITAL_COMMUNITY)
Admission: RE | Admit: 2020-11-08 | Discharge: 2020-11-08 | Disposition: A | Discharge status: Home / Self Care | Payer: Medicare PPO | Source: Ambulatory Visit | Attending: Radiation Oncology | Admitting: Radiation Oncology

## 2020-11-08 DIAGNOSIS — Z01812 Encounter for preprocedural laboratory examination: Secondary | ICD-10-CM | POA: Diagnosis present

## 2020-11-08 DIAGNOSIS — Z20822 Contact with and (suspected) exposure to covid-19: Secondary | ICD-10-CM | POA: Diagnosis not present

## 2020-11-08 LAB — SARS CORONAVIRUS 2 (TAT 6-24 HRS): SARS Coronavirus 2: NEGATIVE

## 2020-11-08 NOTE — Progress Notes (Incomplete)
  Radiation Oncology         ([phone]) [phone] ________________________________  Name: Misty Deleon MRN: [id number]  Date: 11/09/2020  DOB: 08-31-1951  CC: Alvester Chou, NP  Alvester Chou, NP  HDR BRACHYTHERAPY NOTE  DIAGNOSIS: Clincal stage IIIC-1 (cT2, cN1, cM0) poorly differentiated cervical cancer   Simple treatment device note: Patient had construction of her custom vaginal cylinder. She will be treated with a *** cm diameter segmented cylinder. This conforms to her anatomy without undue discomfort.  Vaginal brachytherapy procedure node: The patient was brought to the [place] suite. Identity was confirmed. All relevant records and images related to the planned course of therapy were reviewed. The patient freely provided informed written consent to proceed with treatment after reviewing the details related to the planned course of therapy. The consent form was witnessed and verified by the simulation staff. Then, the patient was set-up in a stable reproducible supine position for radiation therapy. Pelvic exam revealed the vaginal cuff to be intact ***. The patient's custom vaginal cylinder was placed in the proximal vagina. This was affixed to the CT/MR stabilization plate to prevent slippage. Patient tolerated the placement well.  Verification simulation note:  A fiducial marker was placed within the vaginal cylinder. An AP and lateral film was then obtained through the pelvis area. This documented accurate position of the vaginal cylinder for treatment.  HDR BRACHYTHERAPY TREATMENT  The remote afterloading device was affixed to the vaginal cylinder by catheter. Patient then proceeded to undergo her first high-dose-rate treatment directed at the proximal vagina. The patient was prescribed a dose of *** gray to be delivered to the mucosal surface. Treatment length was *** cm. Patient was treated with *** channel using *** dwell positions. Treatment time was *** seconds. Iridium 192 was the  high-dose-rate source for treatment. The patient tolerated the treatment well. After completion of her therapy, a radiation survey was performed documenting return of the iridium source into the [employer] safe.   PLAN: The patient will return next week for her second high-dose-rate treatment. ________________________________    Blair Promise, PhD, MD  This document serves as a record of services personally performed by Gery Pray, MD. It was created on his behalf by Clerance Lav, a trained medical scribe. The creation of this record is based on the scribe's personal observations and the provider's statements to them. This document has been checked and approved by the attending provider.

## 2020-11-09 ENCOUNTER — Ambulatory Visit (HOSPITAL_BASED_OUTPATIENT_CLINIC_OR_DEPARTMENT_OTHER)
Admission: RE | Admit: 2020-11-09 | Discharge: 2020-11-09 | Disposition: A | Discharge status: Other Acute Inpatient Hospital | Payer: Medicare PPO | Attending: Radiation Oncology | Admitting: Radiation Oncology

## 2020-11-09 ENCOUNTER — Ambulatory Visit
Admission: RE | Admit: 2020-11-09 | Discharge: 2020-11-09 | Disposition: A | Discharge status: Home / Self Care | Payer: Medicare PPO | Source: Ambulatory Visit | Attending: Radiation Oncology | Admitting: Radiation Oncology

## 2020-11-09 ENCOUNTER — Other Ambulatory Visit: Payer: Self-pay | Admitting: Hematology and Oncology

## 2020-11-09 ENCOUNTER — Encounter (HOSPITAL_BASED_OUTPATIENT_CLINIC_OR_DEPARTMENT_OTHER): Payer: Self-pay | Admitting: Radiation Oncology

## 2020-11-09 ENCOUNTER — Other Ambulatory Visit (HOSPITAL_COMMUNITY): Payer: Medicare PPO

## 2020-11-09 ENCOUNTER — Encounter (HOSPITAL_BASED_OUTPATIENT_CLINIC_OR_DEPARTMENT_OTHER)
Admission: RE | Disposition: A | Discharge status: Other Acute Inpatient Hospital | Payer: Self-pay | Source: Home / Self Care | Attending: Radiation Oncology

## 2020-11-09 ENCOUNTER — Other Ambulatory Visit: Payer: Self-pay

## 2020-11-09 ENCOUNTER — Ambulatory Visit (HOSPITAL_COMMUNITY)
Admission: RE | Admit: 2020-11-09 | Discharge: 2020-11-09 | Disposition: A | Discharge status: Home / Self Care | Payer: Medicare PPO | Source: Ambulatory Visit | Attending: Radiation Oncology | Admitting: Radiation Oncology

## 2020-11-09 ENCOUNTER — Ambulatory Visit (HOSPITAL_BASED_OUTPATIENT_CLINIC_OR_DEPARTMENT_OTHER): Payer: Medicare PPO | Admitting: Anesthesiology

## 2020-11-09 VITALS — BP 126/64 | HR 107 | Temp 97.3°F | Resp 18

## 2020-11-09 DIAGNOSIS — D539 Nutritional anemia, unspecified: Secondary | ICD-10-CM

## 2020-11-09 DIAGNOSIS — C539 Malignant neoplasm of cervix uteri, unspecified: Secondary | ICD-10-CM | POA: Diagnosis not present

## 2020-11-09 DIAGNOSIS — Z923 Personal history of irradiation: Secondary | ICD-10-CM | POA: Diagnosis not present

## 2020-11-09 DIAGNOSIS — Z9221 Personal history of antineoplastic chemotherapy: Secondary | ICD-10-CM | POA: Diagnosis not present

## 2020-11-09 DIAGNOSIS — Z7984 Long term (current) use of oral hypoglycemic drugs: Secondary | ICD-10-CM | POA: Diagnosis not present

## 2020-11-09 DIAGNOSIS — Z79899 Other long term (current) drug therapy: Secondary | ICD-10-CM | POA: Diagnosis not present

## 2020-11-09 DIAGNOSIS — Z95828 Presence of other vascular implants and grafts: Secondary | ICD-10-CM | POA: Insufficient documentation

## 2020-11-09 HISTORY — DX: Nutritional anemia, unspecified: D53.9

## 2020-11-09 HISTORY — DX: Gastro-esophageal reflux disease without esophagitis: K21.9

## 2020-11-09 HISTORY — DX: Malignant neoplasm of cervix uteri, unspecified: C53.9

## 2020-11-09 HISTORY — DX: Chronic kidney disease, stage 3 unspecified: N18.30

## 2020-11-09 HISTORY — PX: TANDEM RING INSERTION: SHX6199

## 2020-11-09 HISTORY — DX: Hypomagnesemia: E83.42

## 2020-11-09 HISTORY — PX: OPERATIVE ULTRASOUND: SHX5996

## 2020-11-09 HISTORY — DX: Type 2 diabetes mellitus without complications: E11.9

## 2020-11-09 HISTORY — DX: Presence of spectacles and contact lenses: Z97.3

## 2020-11-09 LAB — POCT I-STAT, CHEM 8
BUN: 8 mg/dL (ref 8–23)
Calcium, Ion: 1.16 mmol/L (ref 1.15–1.40)
Chloride: 98 mmol/L (ref 98–111)
Creatinine, Ser: 0.9 mg/dL (ref 0.44–1.00)
Glucose, Bld: 205 mg/dL — ABNORMAL HIGH (ref 70–99)
HCT: 24 % — ABNORMAL LOW (ref 36.0–46.0)
Hemoglobin: 8.2 g/dL — ABNORMAL LOW (ref 12.0–15.0)
Potassium: 3.3 mmol/L — ABNORMAL LOW (ref 3.5–5.1)
Sodium: 140 mmol/L (ref 135–145)
TCO2: 27 mmol/L (ref 22–32)

## 2020-11-09 LAB — GLUCOSE, CAPILLARY: Glucose-Capillary: 175 mg/dL — ABNORMAL HIGH (ref 70–99)

## 2020-11-09 SURGERY — INSERTION, UTERINE TANDEM AND RING OR CYLINDER, FOR BRACHYTHERAPY
Anesthesia: General | Site: Vagina

## 2020-11-09 MED ORDER — ACETAMINOPHEN 500 MG PO TABS
1000.0000 mg | ORAL_TABLET | Freq: Once | ORAL | Status: AC
  Administered 2020-11-09: 1000 mg via ORAL

## 2020-11-09 MED ORDER — SODIUM CHLORIDE 0.9 % IR SOLN
Status: DC | PRN
  Administered 2020-11-09: 250 mL via INTRAVESICAL

## 2020-11-09 MED ORDER — LIDOCAINE HCL (PF) 2 % IJ SOLN
INTRAMUSCULAR | Status: AC
  Filled 2020-11-09: qty 5

## 2020-11-09 MED ORDER — SODIUM CHLORIDE 0.9 % IV SOLN: INTRAVENOUS | Status: DC

## 2020-11-09 MED ORDER — HYDROMORPHONE HCL 1 MG/ML IJ SOLN: 0.2500 mg | INTRAMUSCULAR | Status: DC | PRN

## 2020-11-09 MED ORDER — LIDOCAINE HCL (CARDIAC) PF 100 MG/5ML IV SOSY
PREFILLED_SYRINGE | INTRAVENOUS | Status: DC | PRN
  Administered 2020-11-09: 60 mg via INTRAVENOUS

## 2020-11-09 MED ORDER — FENTANYL CITRATE (PF) 100 MCG/2ML IJ SOLN
INTRAMUSCULAR | Status: DC | PRN
  Administered 2020-11-09 (×2): 50 ug via INTRAVENOUS

## 2020-11-09 MED ORDER — ONDANSETRON HCL 4 MG/2ML IJ SOLN
INTRAMUSCULAR | Status: AC
  Filled 2020-11-09: qty 2

## 2020-11-09 MED ORDER — SCOPOLAMINE 1 MG/3DAYS TD PT72: 1.0000 | MEDICATED_PATCH | TRANSDERMAL | Status: DC

## 2020-11-09 MED ORDER — ONDANSETRON HCL 4 MG/2ML IJ SOLN
INTRAMUSCULAR | Status: DC | PRN
Start: 2020-11-09 — End: 2020-11-09
  Administered 2020-11-09: 4 mg via INTRAVENOUS

## 2020-11-09 MED ORDER — EPHEDRINE 5 MG/ML INJ
INTRAVENOUS | Status: AC
  Filled 2020-11-09: qty 10

## 2020-11-09 MED ORDER — PHENYLEPHRINE 40 MCG/ML (10ML) SYRINGE FOR IV PUSH (FOR BLOOD PRESSURE SUPPORT)
PREFILLED_SYRINGE | INTRAVENOUS | Status: AC
  Filled 2020-11-09: qty 10

## 2020-11-09 MED ORDER — PROPOFOL 10 MG/ML IV BOLUS
INTRAVENOUS | Status: DC | PRN
  Administered 2020-11-09: 150 mg via INTRAVENOUS

## 2020-11-09 MED ORDER — DEXAMETHASONE SODIUM PHOSPHATE 10 MG/ML IJ SOLN
INTRAMUSCULAR | Status: AC
  Filled 2020-11-09: qty 1

## 2020-11-09 MED ORDER — EPHEDRINE SULFATE 50 MG/ML IJ SOLN
INTRAMUSCULAR | Status: DC | PRN
  Administered 2020-11-09 (×3): 5 mg via INTRAVENOUS

## 2020-11-09 MED ORDER — OXYCODONE HCL 5 MG PO TABS: 5.0000 mg | ORAL_TABLET | Freq: Once | ORAL | Status: DC | PRN

## 2020-11-09 MED ORDER — MIDAZOLAM HCL 2 MG/2ML IJ SOLN: 0.5000 mg | Freq: Once | INTRAMUSCULAR | Status: DC | PRN

## 2020-11-09 MED ORDER — PROMETHAZINE HCL 25 MG/ML IJ SOLN
6.2500 mg | INTRAMUSCULAR | Status: DC | PRN
Start: 2020-11-09 — End: 2020-11-09

## 2020-11-09 MED ORDER — PROPOFOL 10 MG/ML IV BOLUS
INTRAVENOUS | Status: AC
  Filled 2020-11-09: qty 20

## 2020-11-09 MED ORDER — FENTANYL CITRATE (PF) 100 MCG/2ML IJ SOLN
INTRAMUSCULAR | Status: AC
  Filled 2020-11-09: qty 2

## 2020-11-09 MED ORDER — SODIUM CHLORIDE 0.9% FLUSH
10.0000 mL | INTRAVENOUS | Status: DC | PRN
  Administered 2020-11-09: 10 mL via INTRAVENOUS

## 2020-11-09 MED ORDER — OXYCODONE HCL 5 MG/5ML PO SOLN: 5.0000 mg | Freq: Once | ORAL | Status: DC | PRN

## 2020-11-09 MED ORDER — ACETAMINOPHEN 500 MG PO TABS
ORAL_TABLET | ORAL | Status: AC
  Filled 2020-11-09: qty 2

## 2020-11-09 MED ORDER — MEPERIDINE HCL 25 MG/ML IJ SOLN: 6.2500 mg | INTRAMUSCULAR | Status: DC | PRN

## 2020-11-09 MED ORDER — PHENYLEPHRINE HCL (PRESSORS) 10 MG/ML IV SOLN
INTRAVENOUS | Status: DC | PRN
  Administered 2020-11-09 (×6): 120 ug via INTRAVENOUS

## 2020-11-09 MED ORDER — HEPARIN SOD (PORK) LOCK FLUSH 100 UNIT/ML IV SOLN
500.0000 [IU] | Freq: Once | INTRAVENOUS | Status: AC
  Administered 2020-11-09: 500 [IU] via INTRAVENOUS

## 2020-11-09 MED ORDER — GLYCOPYRROLATE PF 0.2 MG/ML IJ SOSY
PREFILLED_SYRINGE | INTRAMUSCULAR | Status: AC
  Filled 2020-11-09: qty 1

## 2020-11-09 SURGICAL SUPPLY — 23 items
BNDG CONFORM 2 STRL LF (GAUZE/BANDAGES/DRESSINGS) IMPLANT
COVER WAND RF STERILE (DRAPES) ×3 IMPLANT
DILATOR CANAL MILEX (MISCELLANEOUS) IMPLANT
DRSG PAD ABDOMINAL 8X10 ST (GAUZE/BANDAGES/DRESSINGS) ×3 IMPLANT
GAUZE 4X4 16PLY RFD (DISPOSABLE) ×3 IMPLANT
GLOVE SURG ENC MOIS LTX SZ6.5 (GLOVE) ×3 IMPLANT
GLOVE SURG ENC MOIS LTX SZ7.5 (GLOVE) ×6 IMPLANT
GLOVE SURG UNDER POLY LF SZ7 (GLOVE) ×6 IMPLANT
GOWN STRL REUS W/ TWL LRG LVL3 (GOWN DISPOSABLE) ×2 IMPLANT
GOWN STRL REUS W/TWL LRG LVL3 (GOWN DISPOSABLE) ×6 IMPLANT
HOLDER FOLEY CATH W/STRAP (MISCELLANEOUS) ×3 IMPLANT
IV NS 1000ML (IV SOLUTION)
IV NS 1000ML BAXH (IV SOLUTION) IMPLANT
IV SET EXTENSION GRAVITY 40 LF (IV SETS) ×3 IMPLANT
KIT TURNOVER CYSTO (KITS) ×3 IMPLANT
MAT PREVALON FULL STRYKER (MISCELLANEOUS) ×3 IMPLANT
NS IRRIG 500ML POUR BTL (IV SOLUTION) ×3 IMPLANT
PACK VAGINAL MINOR WOMEN LF (CUSTOM PROCEDURE TRAY) ×3 IMPLANT
PACKING VAGINAL (PACKING) IMPLANT
PAD OB MATERNITY 4.3X12.25 (PERSONAL CARE ITEMS) IMPLANT
TOWEL OR 17X26 10 PK STRL BLUE (TOWEL DISPOSABLE) ×3 IMPLANT
TRAY FOLEY W/BAG SLVR 14FR LF (SET/KITS/TRAYS/PACK) ×3 IMPLANT
WATER STERILE IRR 500ML POUR (IV SOLUTION) IMPLANT

## 2020-11-09 NOTE — Op Note (Signed)
11/09/2020  10:17 AM  PATIENT:  Misty Deleon  70 y.o. female  PRE-OPERATIVE DIAGNOSIS:  cervical CA  POST-OPERATIVE DIAGNOSIS:  cervical CA  PROCEDURE:  Procedure(s): TANDEM RING INSERTION (N/A) OPERATIVE ULTRASOUND (N/A)  SURGEON:  Surgeon(s) and Role:    * Gery Pray, MD - Primary  PHYSICIAN ASSISTANT:   ASSISTANTS: none   ANESTHESIA:   general  EBL:  0 mL   BLOOD ADMINISTERED:none  DRAINS: Urinary Catheter (Foley)   LOCAL MEDICATIONS USED:  NONE  SPECIMEN:  No Specimen  DISPOSITION OF SPECIMEN:  N/A  COUNTS:  YES  TOURNIQUET:  * No tourniquets in log *  DICTATION: The patient was transported to the outpatient OR room 4.  The patient was prepped and draped in the usual sterile fashion and placed in the dorsolithotomy position.  Timeout was performed for the procedure.  Patient had placement of a Foley catheter without difficulty.  The bladder was then backfilled with approximately 250 cc of sterile water for ultrasound imaging purposes.  On exam under anesthesia the patient was noted to have a bandlike constriction in the distal vaginal area that would not stretch during general anesthesia.  Given this narrow area it was determined that the tandem ring system would not work based on the patient's anatomy due to the difficulty of placing the ring within the  upper vaginal cavity.  On exam under anesthesia the patient was noted to have significant shrinkage of the cervical mass.  Estimates based on ultrasound and bimanual and rectovaginal exam, the cervix was approximately 2 cm in size.  No parametrial extension.  The proximal vagina was also somewhat narrowed to the cervical os and therefore also a ring apparatus would not be able to placed adequately within the target area.  On ultrasound imaging the patient was noted to have a anteverted uterus to approximately 45 degrees.  The patient then proceeded to undergo placement of a narrow speculum.  The cervical os was  visualized and the cervical os was dilated without difficulty.  Ultrasound images were excellent.  Again on ultrasound measurements the cervical region measured approximately 2 cm in maximal dimension.  After dilation of the cervical os the patient then had placement of a 60 mm approximately 45 degree tandem within the endometrial cavity and endocervical canal.  Good placement was verified on intraoperative ultrasound.  Patient then had attached to this 60 mm tandem a 2 cm diameter cylinder this was in 2 parts and a clamp was placed distally to ensure good placement.  Final intraoperative ultrasound imaging showed good placement of the tandem cylinder for high-dose-rate radiation therapy.  Patient tolerated the procedure well.  She was subsequently transported to the recovery room in stable condition.  Later in the day the patient will be taken to radiation oncology for planning and her first high-dose-rate treatment.  Patient will receive approximately 5.5 Gy to the high risk clinical target volume.  PLAN OF CARE: Patient was transported to the patient oncology department for planning and treatment  PATIENT DISPOSITION:  PACU - hemodynamically stable.   Delay start of Pharmacological VTE agent (>24hrs) due to surgical blood loss or risk of bleeding: not applicable

## 2020-11-09 NOTE — H&P (View-Only) (Signed)
Radiation none         ([phone]) [phone] ________________________________  History and physical examination  Name: Misty Deleon MRN: [id number]  Date: 10/18/2020  DOB: 1951/02/21  Misty Deleon, Misty Free, NP  No ref. provider found   REFERRING PHYSICIAN: No ref. provider found  DIAGNOSIS: The primary encounter diagnosis was Malignant neoplasm of cervix, unspecified site [facility]). A diagnosis of Cancer of cervix [facility]) was also pertinent to this visit.  Clincal stage IIIC-1 (cT2, cN1, cM0) poorly differentiated cervical cancer  HISTORY OF PRESENT ILLNESS::Misty Deleon is a 70 y.o. female who is seen as a courtesy of Dr. Berline Lopes for an opinion concerning radiation therapy as part of management for her recently diagnosed cervical cancer.. The patient presented to Dr. Murrell Redden on 08/13/2020 for her annual exam. At that time, she reported a several month history of intermittent postmenopausal bleeding. On examination, a cervical mass was noted. PAP smear revealed poorly differentiated malignant cells. 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  Subsequently, the patient was seen in consultation with Dr. Berline Lopes on 08/20/2020, during which time they discussed treatment options including radical hysterectomy with pelvic lymphadenectomy vs radiation. The patient desired to proceed with surgery.  MRI of pelvis on 08/26/2020 showed a large cervical mass with pelvic adenopathy, at least T2B N1 disease. There was also question of involvement of posterior urinary bladder (T4) for which additional images were requested. There were, however, small field-of-view images that showed that the parametrial extension came in very close proximity to the right ureter. An addendum was added and stated that there was also abnormal signal that extended throughout the endometrial canal arising from the cervix. Distension of the cervical canal was present. Enhancement pattern was less than expected for frank extension  of cervical tumor into the endometrium. Additional imaging were concerning for bladder seroma involvement by the tumor as well as involvement of the peritoneal reflection at the level of the sigmoid. There was also noted to be an FDG avid cystic lesion on the patient's back (patient noted that it has been there for approximately 10 years and sometimes required antibiotic therapy, which she is currently on).  PET scan on 09/01/2020 showed a large cervical mass that extended into the endometrial canal to the level of the uterine fundus, associated with pelvic adenopathy to the level of the right common iliac chain. There were no signs of solid organ uptake or metastatic disease to the chest. Additionally, there was a subcutaneous nodule with marked increased metabolic activity; correlation with direct clinical inspection was suggested to exclude cutaneous neoplasm or subcutaneous nodule in that location. Complicated sebaceous cyst could also potentially have that appearance. There was also uptake noted in the anal canal that could have likely been physiologic. Given the intense nature of the uptake, correlation with direct clinical inspection/examination was recommended. Finally, there was uptake in axillary lymph nodes with activity less than mediastinal blood pool, likely small reactive lymph nodes. Morphologic features were also with benign appearance.  The patient was seen in consultation with Dr. Alvy Bimler on 09/01/2020, during which time they discussed concurrent chemoradiation therapy with curative intent.  The patient's case was discussed at the Gynecologic Oncology Multi-Disciplinary Disposition Conference on 09/06/2020. It was recommended that she proceed with primary chemoradiation.  The patient has recently completed her external beam radiation and radiosensitizing chemotherapy.  The patient is now ready to proceed with intracavitary brachytherapy treatments to complete her definitive course of  radiation therapy  PREVIOUS RADIATION THERAPY: No  PAST MEDICAL HISTORY:  Past Medical History:  Diagnosis Date  . CKD (chronic kidney disease), stage III ([place])   . Deficiency anemia    B12 def.  treated with b12 injection and has received iron infusion's  . GERD (gastroesophageal reflux disease)   . Hypercholesteremia   . Hypertension   . Hypomagnesemia   . Malignant neoplasm cervix [facility]) oncologist--- dr gorsuch/  radiation oncology--- dr Sondra Come   dx 12/ 2021,  Stage IIIC-1,  with pelvic adeopathy;  concurrent chemo/ radiation;  chemo started 09-20-2020 and pt completed IMRT 11-01-2020 / scheduled for high dose brachytherapy to start 11-09-2020  . PCOS (polycystic ovarian syndrome)   . PMB (postmenopausal bleeding)   . Type 2 diabetes mellitus ([place])    followed by pc   (11-04-2020 pt stated does not check blood sugar)  . Wears contact lenses     PAST SURGICAL HISTORY: Past Surgical History:  Procedure Laterality Date  . CESAREAN SECTION  x2 last one 39  . IR IMAGING GUIDED PORT INSERTION  09/16/2020  . WISDOM TOOTH EXTRACTION  1980s    FAMILY HISTORY:  Family History  Problem Relation Age of Onset  . Diabetes Father   . Brain cancer Son   . Breast cancer Neg Hx   . Colon cancer Neg Hx   . Ovarian cancer Neg Hx   . Endometrial cancer Neg Hx   . Prostate cancer Neg Hx   . Pancreatic cancer Neg Hx     SOCIAL HISTORY:  Social History   Tobacco Use  . Smoking status: Never Smoker  . Smokeless tobacco: Never Used  Vaping Use  . Vaping Use: Never used  Substance Use Topics  . Alcohol use: Never  . Drug use: Never    ALLERGIES: No Known Allergies  MEDICATIONS:  Current Facility-Administered Medications  Medication Dose Route Frequency Provider Last Rate Last Admin  . 0.9 %  sodium chloride infusion   Intravenous Continuous Duane Boston, MD        REVIEW OF SYSTEMS:  A 10+ POINT REVIEW OF SYSTEMS WAS OBTAINED including neurology, dermatology, psychiatry,  cardiac, respiratory, lymph, extremities, GI, GU, musculoskeletal, constitutional, reproductive, HEENT.  She denies any pelvic or low back pain.  She denies any significant vaginal bleeding at this time.  She denies any hematuria or rectal bleeding.  Energy level is good at this time and appetite is good.   PHYSICAL EXAM:  height is 5\' 2"  (1.575 m) and weight is 205 lb (93 kg). Her oral temperature is 97.9 F (36.6 C). Her blood pressure is 159/87 (abnormal) and her pulse is 126 (abnormal). Her respiration is 15 and oxygen saturation is 99%.   General: Alert and oriented, in no acute distress HEENT: Head is normocephalic. Extraocular movements are intact.  Neck: Neck is supple, no palpable cervical or supraclavicular lymphadenopathy. Heart: Regular in rate and rhythm with no murmurs, rubs, or gallops. Chest: Clear to auscultation bilaterally, with no rhonchi, wheezes, or rales. Abdomen: Soft, nontender, nondistended, with no rigidity or guarding. Extremities: No cyanosis or edema. Lymphatics: see Neck Exam Skin: No concerning lesions. Musculoskeletal: symmetric strength and muscle tone throughout. Neurologic: Cranial nerves II through XII are grossly intact. No obvious focalities. Speech is fluent. Coordination is intact. Psychiatric: Judgment and insight are intact. Affect is appropriate. On pelvic exam the external genitalia are unremarkable.  No lesions noted.  On speculum examination the patient is noted to have a friable lesion which completely replaces the cervical region.  Minimal bleeding at this time.  On bimanual and rectal vaginal examination the cervical mass is estimated to be approximately 4 cm.  Difficult to evaluate parametrial extension in light of the patient's body habitus.  No obvious extension into the vaginal area.  ECOG = 1  0 - Asymptomatic (Fully active, able to carry on all predisease activities without restriction)  1 - Symptomatic but completely ambulatory  (Restricted in physically strenuous activity but ambulatory and able to carry out work of a light or sedentary nature. For example, light housework, office work)  2 - Symptomatic, <50% in bed during the day (Ambulatory and capable of all self care but unable to carry out any work activities. Up and about more than 50% of waking hours)  3 - Symptomatic, >50% in bed, but not bedbound (Capable of only limited self-care, confined to bed or chair 50% or more of waking hours)  4 - Bedbound (Completely disabled. Cannot carry on any self-care. Totally confined to bed or chair)  5 - Death   Eustace Pen MM, Creech RH, Tormey DC, et al. [phone]). "Toxicity and response criteria of the [facility]". [place] Oncol. 5 (6): [phone]  LABORATORY DATA:  Lab Results  Component Value Date   WBC 2.3 (L) 10/28/2020   HGB 9.3 (L) 10/28/2020   HCT 26.7 (L) 10/28/2020   MCV 80.9 10/28/2020   PLT 153 10/28/2020   NEUTROABS 1.7 10/28/2020   Lab Results  Component Value Date   NA 138 10/28/2020   K 3.7 10/28/2020   CL 99 10/28/2020   CO2 28 10/28/2020   GLUCOSE 194 (H) 10/28/2020   CREATININE 1.01 (H) 10/28/2020   CALCIUM 8.6 (L) 10/28/2020      RADIOGRAPHY: No results found.    IMPRESSION: Clincal stage IIIC-1 (cT2, cN1, cM0) poorly differentiated cervical cancer.   The patient has completed her initial course of treatment including external beam radiation therapy and radiosensitizing chemotherapy.  The patient is now ready to proceed with high-dose-rate radiation treatments directed at the cervical region.  PLAN: The patient will be taken to the operating room on March 1 at 52 for her first intracavitary brachytherapy treatment.  The patient will undergo exam under anesthesia dilation of the cervix and placement of tandem ring apparatus preparation for high-dose-rate radiation therapy with iridium 192 is the high-dose-rate source.  Patient is scheduled to receive 5 high-dose-rate  treatments directed at the cervical region.  This will complete her definitive course of radiation therapy.  Marland Kitchen   ------------------------------------------------  Blair Promise, PhD, MD  This document serves as a record of services personally performed by Gery Pray, MD. It was created on his behalf by Clerance Lav, a trained medical scribe. The creation of this record is based on the scribe's personal observations and the provider's statements to them. This document has been checked and approved by the attending provider.

## 2020-11-09 NOTE — H&P (Signed)
Radiation none         [phone] ________________________________  History and physical examination  Name: Misty Deleon MRN: [id number]  Date: 10/18/2020  DOB: 1951/04/18  YQ:IHKV, Almyra Free, NP  No ref. provider found   REFERRING PHYSICIAN: No ref. provider found  DIAGNOSIS: The primary encounter diagnosis was Malignant neoplasm of cervix, unspecified site [facility]). A diagnosis of Cancer of cervix [facility]) was also pertinent to this visit.  Clincal stage IIIC-1 (cT2, cN1, cM0) poorly differentiated cervical cancer  HISTORY OF PRESENT ILLNESS::Misty Deleon is a 70 y.o. female who is seen as a courtesy of Dr. Berline Lopes for an opinion concerning radiation therapy as part of management for her recently diagnosed cervical cancer.. The patient presented to Dr. Murrell Redden on 08/13/2020 for her annual exam. At that time, she reported a several month history of intermittent postmenopausal bleeding. On examination, a cervical mass was noted. PAP smear revealed poorly differentiated malignant cells. 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  Subsequently, the patient was seen in consultation with Dr. Berline Lopes on 08/20/2020, during which time they discussed treatment options including radical hysterectomy with pelvic lymphadenectomy vs radiation. The patient desired to proceed with surgery.  MRI of pelvis on 08/26/2020 showed a large cervical mass with pelvic adenopathy, at least T2B N1 disease. There was also question of involvement of posterior urinary bladder (T4) for which additional images were requested. There were, however, small field-of-view images that showed that the parametrial extension came in very close proximity to the right ureter. An addendum was added and stated that there was also abnormal signal that extended throughout the endometrial canal arising from the cervix. Distension of the cervical canal was present. Enhancement pattern was less than expected for frank extension  of cervical tumor into the endometrium. Additional imaging were concerning for bladder seroma involvement by the tumor as well as involvement of the peritoneal reflection at the level of the sigmoid. There was also noted to be an FDG avid cystic lesion on the patient's back (patient noted that it has been there for approximately 10 years and sometimes required antibiotic therapy, which she is currently on).  PET scan on 09/01/2020 showed a large cervical mass that extended into the endometrial canal to the level of the uterine fundus, associated with pelvic adenopathy to the level of the right common iliac chain. There were no signs of solid organ uptake or metastatic disease to the chest. Additionally, there was a subcutaneous nodule with marked increased metabolic activity; correlation with direct clinical inspection was suggested to exclude cutaneous neoplasm or subcutaneous nodule in that location. Complicated sebaceous cyst could also potentially have that appearance. There was also uptake noted in the anal canal that could have likely been physiologic. Given the intense nature of the uptake, correlation with direct clinical inspection/examination was recommended. Finally, there was uptake in axillary lymph nodes with activity less than mediastinal blood pool, likely small reactive lymph nodes. Morphologic features were also with benign appearance.  The patient was seen in consultation with Dr. Alvy Bimler on 09/01/2020, during which time they discussed concurrent chemoradiation therapy with curative intent.  The patient's case was discussed at the Gynecologic Oncology Multi-Disciplinary Disposition Conference on 09/06/2020. It was recommended that she proceed with primary chemoradiation.  The patient has recently completed her external beam radiation and radiosensitizing chemotherapy.  The patient is now ready to proceed with intracavitary brachytherapy treatments to complete her definitive course of  radiation therapy  PREVIOUS RADIATION THERAPY: No  PAST MEDICAL HISTORY:  Past Medical History:  Diagnosis Date  . CKD (chronic kidney disease), stage III ([place])   . Deficiency anemia    B12 def.  treated with b12 injection and has received iron infusion's  . GERD (gastroesophageal reflux disease)   . Hypercholesteremia   . Hypertension   . Hypomagnesemia   . Malignant neoplasm cervix [facility]) oncologist--- dr gorsuch/  radiation oncology--- dr Sondra Come   dx 12/ 2021,  Stage IIIC-1,  with pelvic adeopathy;  concurrent chemo/ radiation;  chemo started 09-20-2020 and pt completed IMRT 11-01-2020 / scheduled for high dose brachytherapy to start 11-09-2020  . PCOS (polycystic ovarian syndrome)   . PMB (postmenopausal bleeding)   . Type 2 diabetes mellitus ([place])    followed by pc   (11-04-2020 pt stated does not check blood sugar)  . Wears contact lenses     PAST SURGICAL HISTORY: Past Surgical History:  Procedure Laterality Date  . CESAREAN SECTION  x2 last one 71  . IR IMAGING GUIDED PORT INSERTION  09/16/2020  . WISDOM TOOTH EXTRACTION  1980s    FAMILY HISTORY:  Family History  Problem Relation Age of Onset  . Diabetes Father   . Brain cancer Son   . Breast cancer Neg Hx   . Colon cancer Neg Hx   . Ovarian cancer Neg Hx   . Endometrial cancer Neg Hx   . Prostate cancer Neg Hx   . Pancreatic cancer Neg Hx     SOCIAL HISTORY:  Social History   Tobacco Use  . Smoking status: Never Smoker  . Smokeless tobacco: Never Used  Vaping Use  . Vaping Use: Never used  Substance Use Topics  . Alcohol use: Never  . Drug use: Never    ALLERGIES: No Known Allergies  MEDICATIONS:  Current Facility-Administered Medications  Medication Dose Route Frequency Provider Last Rate Last Admin  . 0.9 %  sodium chloride infusion   Intravenous Continuous Duane Boston, MD        REVIEW OF SYSTEMS:  A 10+ POINT REVIEW OF SYSTEMS WAS OBTAINED including neurology, dermatology, psychiatry,  cardiac, respiratory, lymph, extremities, GI, GU, musculoskeletal, constitutional, reproductive, HEENT.  She denies any pelvic or low back pain.  She denies any significant vaginal bleeding at this time.  She denies any hematuria or rectal bleeding.  Energy level is good at this time and appetite is good.   PHYSICAL EXAM:  height is 5\' 2"  (1.575 m) and weight is 205 lb (93 kg). Her oral temperature is 97.9 F (36.6 C). Her blood pressure is 159/87 (abnormal) and her pulse is 126 (abnormal). Her respiration is 15 and oxygen saturation is 99%.   General: Alert and oriented, in no acute distress HEENT: Head is normocephalic. Extraocular movements are intact.  Neck: Neck is supple, no palpable cervical or supraclavicular lymphadenopathy. Heart: Regular in rate and rhythm with no murmurs, rubs, or gallops. Chest: Clear to auscultation bilaterally, with no rhonchi, wheezes, or rales. Abdomen: Soft, nontender, nondistended, with no rigidity or guarding. Extremities: No cyanosis or edema. Lymphatics: see Neck Exam Skin: No concerning lesions. Musculoskeletal: symmetric strength and muscle tone throughout. Neurologic: Cranial nerves II through XII are grossly intact. No obvious focalities. Speech is fluent. Coordination is intact. Psychiatric: Judgment and insight are intact. Affect is appropriate. On pelvic exam the external genitalia are unremarkable.  No lesions noted.  On speculum examination the patient is noted to have a friable lesion which completely replaces the cervical region.  Minimal bleeding at this time.  On bimanual and rectal vaginal examination the cervical mass is estimated to be approximately 4 cm.  Difficult to evaluate parametrial extension in light of the patient's body habitus.  No obvious extension into the vaginal area.  ECOG = 1  0 - Asymptomatic (Fully active, able to carry on all predisease activities without restriction)  1 - Symptomatic but completely ambulatory  (Restricted in physically strenuous activity but ambulatory and able to carry out work of a light or sedentary nature. For example, light housework, office work)  2 - Symptomatic, <50% in bed during the day (Ambulatory and capable of all self care but unable to carry out any work activities. Up and about more than 50% of waking hours)  3 - Symptomatic, >50% in bed, but not bedbound (Capable of only limited self-care, confined to bed or chair 50% or more of waking hours)  4 - Bedbound (Completely disabled. Cannot carry on any self-care. Totally confined to bed or chair)  5 - Death   Eustace Pen MM, Creech RH, Tormey DC, et al. [phone]). "Toxicity and response criteria of the [facility]". [place] Oncol. 5 (6): 649-55  LABORATORY DATA:  Lab Results  Component Value Date   WBC 2.3 (L) 10/28/2020   HGB 9.3 (L) 10/28/2020   HCT 26.7 (L) 10/28/2020   MCV 80.9 10/28/2020   PLT 153 10/28/2020   NEUTROABS 1.7 10/28/2020   Lab Results  Component Value Date   NA 138 10/28/2020   K 3.7 10/28/2020   CL 99 10/28/2020   CO2 28 10/28/2020   GLUCOSE 194 (H) 10/28/2020   CREATININE 1.01 (H) 10/28/2020   CALCIUM 8.6 (L) 10/28/2020      RADIOGRAPHY: No results found.    IMPRESSION: Clincal stage IIIC-1 (cT2, cN1, cM0) poorly differentiated cervical cancer.   The patient has completed her initial course of treatment including external beam radiation therapy and radiosensitizing chemotherapy.  The patient is now ready to proceed with high-dose-rate radiation treatments directed at the cervical region.  PLAN: The patient will be taken to the operating room on March 1 at 67 for her first intracavitary brachytherapy treatment.  The patient will undergo exam under anesthesia dilation of the cervix and placement of tandem ring apparatus preparation for high-dose-rate radiation therapy with iridium 192 is the high-dose-rate source.  Patient is scheduled to receive 5 high-dose-rate  treatments directed at the cervical region.  This will complete her definitive course of radiation therapy.  Marland Kitchen   ------------------------------------------------  Blair Promise, PhD, MD  This document serves as a record of services personally performed by Gery Pray, MD. It was created on his behalf by Clerance Lav, a trained medical scribe. The creation of this record is based on the scribe's personal observations and the provider's statements to them. This document has been checked and approved by the attending provider.

## 2020-11-09 NOTE — H&P (View-Only) (Signed)
Radiation none         [phone] ________________________________  History and physical examination  Name: Misty Deleon MRN: [id number]  Date: 10/18/2020  DOB: 05/31/1951  JM:EQAS, Almyra Free, NP  No ref. provider found   REFERRING PHYSICIAN: No ref. provider found  DIAGNOSIS: The primary encounter diagnosis was Malignant neoplasm of cervix, unspecified site [facility]). A diagnosis of Cancer of cervix [facility]) was also pertinent to this visit.  Clincal stage IIIC-1 (cT2, cN1, cM0) poorly differentiated cervical cancer  HISTORY OF PRESENT ILLNESS::Misty Deleon is a 70 y.o. female who is seen as a courtesy of Dr. Berline Lopes for an opinion concerning radiation therapy as part of management for her recently diagnosed cervical cancer.. The patient presented to Dr. Murrell Redden on 08/13/2020 for her annual exam. At that time, she reported a several month history of intermittent postmenopausal bleeding. On examination, a cervical mass was noted. PAP smear revealed poorly differentiated malignant cells. 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  Subsequently, the patient was seen in consultation with Dr. Berline Lopes on 08/20/2020, during which time they discussed treatment options including radical hysterectomy with pelvic lymphadenectomy vs radiation. The patient desired to proceed with surgery.  MRI of pelvis on 08/26/2020 showed a large cervical mass with pelvic adenopathy, at least T2B N1 disease. There was also question of involvement of posterior urinary bladder (T4) for which additional images were requested. There were, however, small field-of-view images that showed that the parametrial extension came in very close proximity to the right ureter. An addendum was added and stated that there was also abnormal signal that extended throughout the endometrial canal arising from the cervix. Distension of the cervical canal was present. Enhancement pattern was less than expected for frank extension  of cervical tumor into the endometrium. Additional imaging were concerning for bladder seroma involvement by the tumor as well as involvement of the peritoneal reflection at the level of the sigmoid. There was also noted to be an FDG avid cystic lesion on the patient's back (patient noted that it has been there for approximately 10 years and sometimes required antibiotic therapy, which she is currently on).  PET scan on 09/01/2020 showed a large cervical mass that extended into the endometrial canal to the level of the uterine fundus, associated with pelvic adenopathy to the level of the right common iliac chain. There were no signs of solid organ uptake or metastatic disease to the chest. Additionally, there was a subcutaneous nodule with marked increased metabolic activity; correlation with direct clinical inspection was suggested to exclude cutaneous neoplasm or subcutaneous nodule in that location. Complicated sebaceous cyst could also potentially have that appearance. There was also uptake noted in the anal canal that could have likely been physiologic. Given the intense nature of the uptake, correlation with direct clinical inspection/examination was recommended. Finally, there was uptake in axillary lymph nodes with activity less than mediastinal blood pool, likely small reactive lymph nodes. Morphologic features were also with benign appearance.  The patient was seen in consultation with Dr. Alvy Bimler on 09/01/2020, during which time they discussed concurrent chemoradiation therapy with curative intent.  The patient's case was discussed at the Gynecologic Oncology Multi-Disciplinary Disposition Conference on 09/06/2020. It was recommended that she proceed with primary chemoradiation.  The patient has recently completed her external beam radiation and radiosensitizing chemotherapy.  The patient is now ready to proceed with intracavitary brachytherapy treatments to complete her definitive course of  radiation therapy  PREVIOUS RADIATION THERAPY: No  PAST MEDICAL HISTORY:  Past Medical History:  Diagnosis Date  . CKD (chronic kidney disease), stage III ([place])   . Deficiency anemia    B12 def.  treated with b12 injection and has received iron infusion's  . GERD (gastroesophageal reflux disease)   . Hypercholesteremia   . Hypertension   . Hypomagnesemia   . Malignant neoplasm cervix [facility]) oncologist--- dr gorsuch/  radiation oncology--- dr Sondra Come   dx 12/ 2021,  Stage IIIC-1,  with pelvic adeopathy;  concurrent chemo/ radiation;  chemo started 09-20-2020 and pt completed IMRT 11-01-2020 / scheduled for high dose brachytherapy to start 11-09-2020  . PCOS (polycystic ovarian syndrome)   . PMB (postmenopausal bleeding)   . Type 2 diabetes mellitus ([place])    followed by pc   (11-04-2020 pt stated does not check blood sugar)  . Wears contact lenses     PAST SURGICAL HISTORY: Past Surgical History:  Procedure Laterality Date  . CESAREAN SECTION  x2 last one 3  . IR IMAGING GUIDED PORT INSERTION  09/16/2020  . WISDOM TOOTH EXTRACTION  1980s    FAMILY HISTORY:  Family History  Problem Relation Age of Onset  . Diabetes Father   . Brain cancer Son   . Breast cancer Neg Hx   . Colon cancer Neg Hx   . Ovarian cancer Neg Hx   . Endometrial cancer Neg Hx   . Prostate cancer Neg Hx   . Pancreatic cancer Neg Hx     SOCIAL HISTORY:  Social History   Tobacco Use  . Smoking status: Never Smoker  . Smokeless tobacco: Never Used  Vaping Use  . Vaping Use: Never used  Substance Use Topics  . Alcohol use: Never  . Drug use: Never    ALLERGIES: No Known Allergies  MEDICATIONS:  Current Facility-Administered Medications  Medication Dose Route Frequency Provider Last Rate Last Admin  . 0.9 %  sodium chloride infusion   Intravenous Continuous Duane Boston, MD        REVIEW OF SYSTEMS:  A 10+ POINT REVIEW OF SYSTEMS WAS OBTAINED including neurology, dermatology, psychiatry,  cardiac, respiratory, lymph, extremities, GI, GU, musculoskeletal, constitutional, reproductive, HEENT.  She denies any pelvic or low back pain.  She denies any significant vaginal bleeding at this time.  She denies any hematuria or rectal bleeding.  Energy level is good at this time and appetite is good.   PHYSICAL EXAM:  height is 5\' 2"  (1.575 m) and weight is 205 lb (93 kg). Her oral temperature is 97.9 F (36.6 C). Her blood pressure is 159/87 (abnormal) and her pulse is 126 (abnormal). Her respiration is 15 and oxygen saturation is 99%.   General: Alert and oriented, in no acute distress HEENT: Head is normocephalic. Extraocular movements are intact.  Neck: Neck is supple, no palpable cervical or supraclavicular lymphadenopathy. Heart: Regular in rate and rhythm with no murmurs, rubs, or gallops. Chest: Clear to auscultation bilaterally, with no rhonchi, wheezes, or rales. Abdomen: Soft, nontender, nondistended, with no rigidity or guarding. Extremities: No cyanosis or edema. Lymphatics: see Neck Exam Skin: No concerning lesions. Musculoskeletal: symmetric strength and muscle tone throughout. Neurologic: Cranial nerves II through XII are grossly intact. No obvious focalities. Speech is fluent. Coordination is intact. Psychiatric: Judgment and insight are intact. Affect is appropriate. On pelvic exam the external genitalia are unremarkable.  No lesions noted.  On speculum examination the patient is noted to have a friable lesion which completely replaces the cervical region.  Minimal bleeding at this time.  On bimanual and rectal vaginal examination the cervical mass is estimated to be approximately 4 cm.  Difficult to evaluate parametrial extension in light of the patient's body habitus.  No obvious extension into the vaginal area.  ECOG = 1  0 - Asymptomatic (Fully active, able to carry on all predisease activities without restriction)  1 - Symptomatic but completely ambulatory  (Restricted in physically strenuous activity but ambulatory and able to carry out work of a light or sedentary nature. For example, light housework, office work)  2 - Symptomatic, <50% in bed during the day (Ambulatory and capable of all self care but unable to carry out any work activities. Up and about more than 50% of waking hours)  3 - Symptomatic, >50% in bed, but not bedbound (Capable of only limited self-care, confined to bed or chair 50% or more of waking hours)  4 - Bedbound (Completely disabled. Cannot carry on any self-care. Totally confined to bed or chair)  5 - Death   Eustace Pen MM, Creech RH, Tormey DC, et al. [phone]). "Toxicity and response criteria of the [facility] Group". [place] Oncol. 5 (6): 649-55  LABORATORY DATA:  Lab Results  Component Value Date   WBC 2.3 (L) 10/28/2020   HGB 9.3 (L) 10/28/2020   HCT 26.7 (L) 10/28/2020   MCV 80.9 10/28/2020   PLT 153 10/28/2020   NEUTROABS 1.7 10/28/2020   Lab Results  Component Value Date   NA 138 10/28/2020   K 3.7 10/28/2020   CL 99 10/28/2020   CO2 28 10/28/2020   GLUCOSE 194 (H) 10/28/2020   CREATININE 1.01 (H) 10/28/2020   CALCIUM 8.6 (L) 10/28/2020      RADIOGRAPHY: No results found.    IMPRESSION: Clincal stage IIIC-1 (cT2, cN1, cM0) poorly differentiated cervical cancer.   The patient has completed her initial course of treatment including external beam radiation therapy and radiosensitizing chemotherapy.  The patient is now ready to proceed with high-dose-rate radiation treatments directed at the cervical region.  PLAN: The patient will be taken to the operating room on March 1 at 48 for her first intracavitary brachytherapy treatment.  The patient will undergo exam under anesthesia dilation of the cervix and placement of tandem ring apparatus preparation for high-dose-rate radiation therapy with iridium 192 is the high-dose-rate source.  Patient is scheduled to receive 5 high-dose-rate  treatments directed at the cervical region.  This will complete her definitive course of radiation therapy.  Marland Kitchen   ------------------------------------------------  Blair Promise, PhD, MD  This document serves as a record of services personally performed by Gery Pray, MD. It was created on his behalf by Clerance Lav, a trained medical scribe. The creation of this record is based on the scribe's personal observations and the provider's statements to them. This document has been checked and approved by the attending provider.

## 2020-11-09 NOTE — Anesthesia Postprocedure Evaluation (Signed)
Anesthesia Post Note  Patient: SHAMIYA DEMERITT  Procedure(s) Performed: TANDEM RING INSERTION (N/A Vagina ) OPERATIVE ULTRASOUND (N/A Abdomen)     Patient location during evaluation: Phase II Anesthesia Type: General Level of consciousness: awake and alert, oriented and patient cooperative Pain management: pain level controlled Vital Signs Assessment: post-procedure vital signs reviewed and stable Respiratory status: spontaneous breathing, nonlabored ventilation and respiratory function stable Cardiovascular status: blood pressure returned to baseline and stable Postop Assessment: no apparent nausea or vomiting, adequate PO intake and able to ambulate Anesthetic complications: no   No complications documented.  Last Vitals:  Vitals:   11/09/20 1015 11/09/20 1030  BP: 118/73 120/66  Pulse: (!) 108 (!) 106  Resp: 17 14  Temp:    SpO2: 95% 93%    Last Pain:  Vitals:   11/09/20 1030  TempSrc:   PainSc: 0-No pain                 Keon Benscoter,E. Faline Langer

## 2020-11-09 NOTE — H&P (View-Only) (Signed)
Radiation none         [phone] ________________________________  History and physical examination  Name: Misty Deleon MRN: [id number]  Date: 10/18/2020  DOB: 01-02-1951  SW:NIOE, Misty Free, NP  No ref. provider found   REFERRING PHYSICIAN: No ref. provider found  DIAGNOSIS: The primary encounter diagnosis was Malignant neoplasm of cervix, unspecified site [facility]). A diagnosis of Cancer of cervix [facility]) was also pertinent to this visit.  Clincal stage IIIC-1 (cT2, cN1, cM0) poorly differentiated cervical cancer  HISTORY OF PRESENT ILLNESS::Misty Deleon is a 70 y.o. female who is seen as a courtesy of Dr. Berline Deleon for an opinion concerning radiation therapy as part of management for her recently diagnosed cervical cancer.. The patient presented to Dr. Murrell Deleon on 08/13/2020 for her annual exam. At that time, she reported a several month history of intermittent postmenopausal bleeding. On examination, a cervical mass was noted. PAP smear revealed poorly differentiated malignant cells. 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  Subsequently, the patient was seen in consultation with Dr. Berline Deleon on 08/20/2020, during which time they discussed treatment options including radical hysterectomy with pelvic lymphadenectomy vs radiation. The patient desired to proceed with surgery.  MRI of pelvis on 08/26/2020 showed a large cervical mass with pelvic adenopathy, at least T2B N1 disease. There was also question of involvement of posterior urinary bladder (T4) for which additional images were requested. There were, however, small field-of-view images that showed that the parametrial extension came in very close proximity to the right ureter. An addendum was added and stated that there was also abnormal signal that extended throughout the endometrial canal arising from the cervix. Distension of the cervical canal was present. Enhancement pattern was less than expected for frank extension  of cervical tumor into the endometrium. Additional imaging were concerning for bladder seroma involvement by the tumor as well as involvement of the peritoneal reflection at the level of the sigmoid. There was also noted to be an FDG avid cystic lesion on the patient's back (patient noted that it has been there for approximately 10 years and sometimes required antibiotic therapy, which she is currently on).  PET scan on 09/01/2020 showed a large cervical mass that extended into the endometrial canal to the level of the uterine fundus, associated with pelvic adenopathy to the level of the right common iliac chain. There were no signs of solid organ uptake or metastatic disease to the chest. Additionally, there was a subcutaneous nodule with marked increased metabolic activity; correlation with direct clinical inspection was suggested to exclude cutaneous neoplasm or subcutaneous nodule in that location. Complicated sebaceous cyst could also potentially have that appearance. There was also uptake noted in the anal canal that could have likely been physiologic. Given the intense nature of the uptake, correlation with direct clinical inspection/examination was recommended. Finally, there was uptake in axillary lymph nodes with activity less than mediastinal blood pool, likely small reactive lymph nodes. Morphologic features were also with benign appearance.  The patient was seen in consultation with Dr. Alvy Deleon on 09/01/2020, during which time they discussed concurrent chemoradiation therapy with curative intent.  The patient's case was discussed at the Gynecologic Oncology Multi-Disciplinary Disposition Conference on 09/06/2020. It was recommended that she proceed with primary chemoradiation.  The patient has recently completed her external beam radiation and radiosensitizing chemotherapy.  The patient is now ready to proceed with intracavitary brachytherapy treatments to complete her definitive course of  radiation therapy  PREVIOUS RADIATION THERAPY: No  PAST MEDICAL HISTORY:  Past Medical History:  Diagnosis Date  . CKD (chronic kidney disease), stage III ([place])   . Deficiency anemia    B12 def.  treated with b12 injection and has received iron infusion's  . GERD (gastroesophageal reflux disease)   . Hypercholesteremia   . Hypertension   . Hypomagnesemia   . Malignant neoplasm cervix [facility]) oncologist--- dr gorsuch/  radiation oncology--- dr Sondra Come   dx 12/ 2021,  Stage IIIC-1,  with pelvic adeopathy;  concurrent chemo/ radiation;  chemo started 09-20-2020 and pt completed IMRT 11-01-2020 / scheduled for high dose brachytherapy to start 11-09-2020  . PCOS (polycystic ovarian syndrome)   . PMB (postmenopausal bleeding)   . Type 2 diabetes mellitus ([place])    followed by pc   (11-04-2020 pt stated does not check blood sugar)  . Wears contact lenses     PAST SURGICAL HISTORY: Past Surgical History:  Procedure Laterality Date  . CESAREAN SECTION  x2 last one 52  . IR IMAGING GUIDED PORT INSERTION  09/16/2020  . WISDOM TOOTH EXTRACTION  1980s    FAMILY HISTORY:  Family History  Problem Relation Age of Onset  . Diabetes Father   . Brain cancer Son   . Breast cancer Neg Hx   . Colon cancer Neg Hx   . Ovarian cancer Neg Hx   . Endometrial cancer Neg Hx   . Prostate cancer Neg Hx   . Pancreatic cancer Neg Hx     SOCIAL HISTORY:  Social History   Tobacco Use  . Smoking status: Never Smoker  . Smokeless tobacco: Never Used  Vaping Use  . Vaping Use: Never used  Substance Use Topics  . Alcohol use: Never  . Drug use: Never    ALLERGIES: No Known Allergies  MEDICATIONS:  Current Facility-Administered Medications  Medication Dose Route Frequency Provider Last Rate Last Admin  . 0.9 %  sodium chloride infusion   Intravenous Continuous Duane Boston, MD        REVIEW OF SYSTEMS:  A 10+ POINT REVIEW OF SYSTEMS WAS OBTAINED including neurology, dermatology, psychiatry,  cardiac, respiratory, lymph, extremities, GI, GU, musculoskeletal, constitutional, reproductive, HEENT.  She denies any pelvic or low back pain.  She denies any significant vaginal bleeding at this time.  She denies any hematuria or rectal bleeding.  Energy level is good at this time and appetite is good.   PHYSICAL EXAM:  height is 5\' 2"  (1.575 m) and weight is 205 lb (93 kg). Her oral temperature is 97.9 F (36.6 C). Her blood pressure is 159/87 (abnormal) and her pulse is 126 (abnormal). Her respiration is 15 and oxygen saturation is 99%.   General: Alert and oriented, in no acute distress HEENT: Head is normocephalic. Extraocular movements are intact.  Neck: Neck is supple, no palpable cervical or supraclavicular lymphadenopathy. Heart: Regular in rate and rhythm with no murmurs, rubs, or gallops. Chest: Clear to auscultation bilaterally, with no rhonchi, wheezes, or rales. Abdomen: Soft, nontender, nondistended, with no rigidity or guarding. Extremities: No cyanosis or edema. Lymphatics: see Neck Exam Skin: No concerning lesions. Musculoskeletal: symmetric strength and muscle tone throughout. Neurologic: Cranial nerves II through XII are grossly intact. No obvious focalities. Speech is fluent. Coordination is intact. Psychiatric: Judgment and insight are intact. Affect is appropriate. On pelvic exam the external genitalia are unremarkable.  No lesions noted.  On speculum examination the patient is noted to have a friable lesion which completely replaces the cervical region.  Minimal bleeding at this time.  On bimanual and rectal vaginal examination the cervical mass is estimated to be approximately 4 cm.  Difficult to evaluate parametrial extension in light of the patient's body habitus.  No obvious extension into the vaginal area.  ECOG = 1  0 - Asymptomatic (Fully active, able to carry on all predisease activities without restriction)  1 - Symptomatic but completely ambulatory  (Restricted in physically strenuous activity but ambulatory and able to carry out work of a light or sedentary nature. For example, light housework, office work)  2 - Symptomatic, <50% in bed during the day (Ambulatory and capable of all self care but unable to carry out any work activities. Up and about more than 50% of waking hours)  3 - Symptomatic, >50% in bed, but not bedbound (Capable of only limited self-care, confined to bed or chair 50% or more of waking hours)  4 - Bedbound (Completely disabled. Cannot carry on any self-care. Totally confined to bed or chair)  5 - Death   Eustace Pen MM, Creech RH, Tormey DC, et al. [phone]). "Toxicity and response criteria of the [employer]". [place] Oncol. 5 (6): 649-55  LABORATORY DATA:  Lab Results  Component Value Date   WBC 2.3 (L) 10/28/2020   HGB 9.3 (L) 10/28/2020   HCT 26.7 (L) 10/28/2020   MCV 80.9 10/28/2020   PLT 153 10/28/2020   NEUTROABS 1.7 10/28/2020   Lab Results  Component Value Date   NA 138 10/28/2020   K 3.7 10/28/2020   CL 99 10/28/2020   CO2 28 10/28/2020   GLUCOSE 194 (H) 10/28/2020   CREATININE 1.01 (H) 10/28/2020   CALCIUM 8.6 (L) 10/28/2020      RADIOGRAPHY: No results found.    IMPRESSION: Clincal stage IIIC-1 (cT2, cN1, cM0) poorly differentiated cervical cancer.   The patient has completed her initial course of treatment including external beam radiation therapy and radiosensitizing chemotherapy.  The patient is now ready to proceed with high-dose-rate radiation treatments directed at the cervical region.  PLAN: The patient will be taken to the operating room on March 1 at 55 for her first intracavitary brachytherapy treatment.  The patient will undergo exam under anesthesia dilation of the cervix and placement of tandem ring apparatus preparation for high-dose-rate radiation therapy with iridium 192 is the high-dose-rate source.  Patient is scheduled to receive 5 high-dose-rate  treatments directed at the cervical region.  This will complete her definitive course of radiation therapy.  Marland Kitchen   ------------------------------------------------  Blair Promise, PhD, MD  This document serves as a record of services personally performed by Gery Pray, MD. It was created on his behalf by Clerance Lav, a trained medical scribe. The creation of this record is based on the scribe's personal observations and the provider's statements to them. This document has been checked and approved by the attending provider.

## 2020-11-09 NOTE — Anesthesia Procedure Notes (Signed)
Procedure Name: LMA Insertion Date/Time: 11/09/2020 8:59 AM Performed by: Georgeanne Nim, CRNA Pre-anesthesia Checklist: Patient identified, Patient being monitored, Emergency Drugs available, Timeout performed and Suction available Patient Re-evaluated:Patient Re-evaluated prior to induction Oxygen Delivery Method: Circle System Utilized Preoxygenation: Pre-oxygenation with 100% oxygen Induction Type: IV induction Ventilation: Mask ventilation without difficulty LMA: LMA inserted LMA Size: 4.0 Number of attempts: 1 Placement Confirmation: positive ETCO2 and breath sounds checked- equal and bilateral

## 2020-11-09 NOTE — Patient Instructions (Signed)
IMMEDIATELY FOLLOWING SURGERY: Do not drive or operate machinery for the first twenty four hours after surgery. Do not make any important decisions for twenty four hours after surgery or while taking narcotic pain medications or sedatives. If you develop intractable nausea and vomiting or a severe headache please notify your doctor immediately.   FOLLOW-UP: You do not need to follow up with anesthesia unless specifically instructed to do so.   WOUND CARE INSTRUCTIONS (if applicable): Expect some mild vaginal bleeding, but if large amount of bleeding occurs please contact Dr. Sondra Come at [phone] or the Radiation On-Call physician. Call for any fever greater than 101.0 degrees or increasing vaginal//abdominal pain or trouble urinating.   QUESTIONS?: Please feel free to call your physician or the hospital operator if you have any questions, and they will be happy to assist you. Resume all medications: as listed on your after visit summary. Your next appointment is:  Future Appointments  Date Time Provider [place]  11/16/2020  9:30 AM WL-US 2 WL-US [place]  11/16/2020  1:00 PM Gery Pray, MD [facility] None  11/16/2020  2:00 PM Gery Pray, MD [facility] None  11/25/2020  7:30 AM WL-US 2 WL-US [place]  11/25/2020 11:00 AM Gery Pray, MD [facility] None  11/25/2020  1:00 PM Gery Pray, MD [place] None  11/29/2020  7:30 AM WL-US 2 WL-US [place]  11/29/2020 10:00 AM Gery Pray, MD [facility] None  11/29/2020  1:00 PM Gery Pray, MD [place] None  12/08/2020  7:30 AM WL-US 2 WL-US [place]  12/08/2020 10:00 AM Gery Pray, MD [facility] None  12/08/2020 11:00 AM Gery Pray, MD [facility] None  01/26/2021 10:00 AM [facility] LAB [facility] None  01/26/2021 10:15 AM [facility] [place] FLUSH [facility] None  01/26/2021 10:40 AM Heath Lark, MD [facility] None  01/26/2021 11:15 AM [facility] [place] FLUSH [facility] None

## 2020-11-09 NOTE — Progress Notes (Signed)
  Radiation Oncology         [phone]) [phone] ________________________________  Name: Misty Deleon MRN: [id number]  Date: 11/09/2020  DOB: 04-24-1951  CC: Alvester Chou, NP  Alvester Chou, NP  HDR BRACHYTHERAPY NOTE  DIAGNOSIS: Clincal stage IIIC-1 (cT2, cN1, cM0) poorly differentiated cervical cancer  Simple treatment device note: On the operating room the patient had construction of her custom tandem cylinder system. She will be treated with a 45 tandem/cylinder system with a 2.5 cm diameter segmented cylinder. This conforms to her anatomy without undue discomfort.  Vaginal brachytherapy procedure node: The patient was brought to the [place] suite. Identity was confirmed. All relevant records and images related to the planned course of therapy were reviewed. The patient freely provided informed written consent to proceed with treatment after reviewing the details related to the planned course of therapy. The consent form was witnessed and verified by the simulation staff. Then, the patient was set-up in a stable reproducible supine position for radiation therapy. The tandem cylinder system was accessed and fiducial markers were placed within the tandem and cylinder.   Verification simulation note: An AP and lateral film was obtained through the pelvis area. This was compared to the patient's planning films documenting accurate position of the tandem/cylinder system for treatment.  High-dose-rate brachytherapy treatment note:   The remote afterloading device was accessed through catheter system and attached to the tandem cylinder system. Patient then proceeded to undergo her first high-dose-rate treatment directed at the cervix. The patient was prescribed a dose of 5.5 gray to be delivered to the high risk clinical target  volume.  Patient was treated with 1 channels using 15 dwell positions. Treatment time was 315.2 seconds. The patient tolerated the procedure well. After completion of her therapy, a  radiation survey was performed documenting return of the iridium source into the [employer] safe. The patient was then transferred to the nursing suite.  She then had removal of  the tandem and cylinder system. The patient tolerated the removal well.  PLAN: The patient will return next week for her second high-dose-rate treatment directed at the cervical region. ________________________________   Blair Promise, PhD, MD

## 2020-11-09 NOTE — H&P (View-Only) (Signed)
Radiation none         [phone] ________________________________  History and physical examination  Name: Misty Deleon MRN: [id number]  Date: 10/18/2020  DOB: 09/21/1950  SA:YTKZ, Almyra Free, NP  No ref. provider found   REFERRING PHYSICIAN: No ref. provider found  DIAGNOSIS: The primary encounter diagnosis was Malignant neoplasm of cervix, unspecified site [facility]). A diagnosis of Cancer of cervix [facility]) was also pertinent to this visit.  Clincal stage IIIC-1 (cT2, cN1, cM0) poorly differentiated cervical cancer  HISTORY OF PRESENT ILLNESS::Misty Deleon is a 70 y.o. female who is seen as a courtesy of Dr. Berline Lopes for an opinion concerning radiation therapy as part of management for her recently diagnosed cervical cancer.. The patient presented to Dr. Murrell Redden on 08/13/2020 for her annual exam. At that time, she reported a several month history of intermittent postmenopausal bleeding. On examination, a cervical mass was noted. PAP smear revealed poorly differentiated malignant cells. 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  Subsequently, the patient was seen in consultation with Dr. Berline Lopes on 08/20/2020, during which time they discussed treatment options including radical hysterectomy with pelvic lymphadenectomy vs radiation. The patient desired to proceed with surgery.  MRI of pelvis on 08/26/2020 showed a large cervical mass with pelvic adenopathy, at least T2B N1 disease. There was also question of involvement of posterior urinary bladder (T4) for which additional images were requested. There were, however, small field-of-view images that showed that the parametrial extension came in very close proximity to the right ureter. An addendum was added and stated that there was also abnormal signal that extended throughout the endometrial canal arising from the cervix. Distension of the cervical canal was present. Enhancement pattern was less than expected for frank extension  of cervical tumor into the endometrium. Additional imaging were concerning for bladder seroma involvement by the tumor as well as involvement of the peritoneal reflection at the level of the sigmoid. There was also noted to be an FDG avid cystic lesion on the patient's back (patient noted that it has been there for approximately 10 years and sometimes required antibiotic therapy, which she is currently on).  PET scan on 09/01/2020 showed a large cervical mass that extended into the endometrial canal to the level of the uterine fundus, associated with pelvic adenopathy to the level of the right common iliac chain. There were no signs of solid organ uptake or metastatic disease to the chest. Additionally, there was a subcutaneous nodule with marked increased metabolic activity; correlation with direct clinical inspection was suggested to exclude cutaneous neoplasm or subcutaneous nodule in that location. Complicated sebaceous cyst could also potentially have that appearance. There was also uptake noted in the anal canal that could have likely been physiologic. Given the intense nature of the uptake, correlation with direct clinical inspection/examination was recommended. Finally, there was uptake in axillary lymph nodes with activity less than mediastinal blood pool, likely small reactive lymph nodes. Morphologic features were also with benign appearance.  The patient was seen in consultation with Dr. Alvy Bimler on 09/01/2020, during which time they discussed concurrent chemoradiation therapy with curative intent.  The patient's case was discussed at the Gynecologic Oncology Multi-Disciplinary Disposition Conference on 09/06/2020. It was recommended that she proceed with primary chemoradiation.  The patient has recently completed her external beam radiation and radiosensitizing chemotherapy.  The patient is now ready to proceed with intracavitary brachytherapy treatments to complete her definitive course of  radiation therapy  PREVIOUS RADIATION THERAPY: No  PAST MEDICAL HISTORY:  Past Medical History:  Diagnosis Date  . CKD (chronic kidney disease), stage III ([place])   . Deficiency anemia    B12 def.  treated with b12 injection and has received iron infusion's  . GERD (gastroesophageal reflux disease)   . Hypercholesteremia   . Hypertension   . Hypomagnesemia   . Malignant neoplasm cervix [facility]) oncologist--- dr gorsuch/  radiation oncology--- dr Sondra Come   dx 12/ 2021,  Stage IIIC-1,  with pelvic adeopathy;  concurrent chemo/ radiation;  chemo started 09-20-2020 and pt completed IMRT 11-01-2020 / scheduled for high dose brachytherapy to start 11-09-2020  . PCOS (polycystic ovarian syndrome)   . PMB (postmenopausal bleeding)   . Type 2 diabetes mellitus ([place])    followed by pc   (11-04-2020 pt stated does not check blood sugar)  . Wears contact lenses     PAST SURGICAL HISTORY: Past Surgical History:  Procedure Laterality Date  . CESAREAN SECTION  x2 last one 109  . IR IMAGING GUIDED PORT INSERTION  09/16/2020  . WISDOM TOOTH EXTRACTION  1980s    FAMILY HISTORY:  Family History  Problem Relation Age of Onset  . Diabetes Father   . Brain cancer Son   . Breast cancer Neg Hx   . Colon cancer Neg Hx   . Ovarian cancer Neg Hx   . Endometrial cancer Neg Hx   . Prostate cancer Neg Hx   . Pancreatic cancer Neg Hx     SOCIAL HISTORY:  Social History   Tobacco Use  . Smoking status: Never Smoker  . Smokeless tobacco: Never Used  Vaping Use  . Vaping Use: Never used  Substance Use Topics  . Alcohol use: Never  . Drug use: Never    ALLERGIES: No Known Allergies  MEDICATIONS:  Current Facility-Administered Medications  Medication Dose Route Frequency Provider Last Rate Last Admin  . 0.9 %  sodium chloride infusion   Intravenous Continuous Duane Boston, MD        REVIEW OF SYSTEMS:  A 10+ POINT REVIEW OF SYSTEMS WAS OBTAINED including neurology, dermatology, psychiatry,  cardiac, respiratory, lymph, extremities, GI, GU, musculoskeletal, constitutional, reproductive, HEENT.  She denies any pelvic or low back pain.  She denies any significant vaginal bleeding at this time.  She denies any hematuria or rectal bleeding.  Energy level is good at this time and appetite is good.   PHYSICAL EXAM:  height is 5\' 2"  (1.575 m) and weight is 205 lb (93 kg). Her oral temperature is 97.9 F (36.6 C). Her blood pressure is 159/87 (abnormal) and her pulse is 126 (abnormal). Her respiration is 15 and oxygen saturation is 99%.   General: Alert and oriented, in no acute distress HEENT: Head is normocephalic. Extraocular movements are intact.  Neck: Neck is supple, no palpable cervical or supraclavicular lymphadenopathy. Heart: Regular in rate and rhythm with no murmurs, rubs, or gallops. Chest: Clear to auscultation bilaterally, with no rhonchi, wheezes, or rales. Abdomen: Soft, nontender, nondistended, with no rigidity or guarding. Extremities: No cyanosis or edema. Lymphatics: see Neck Exam Skin: No concerning lesions. Musculoskeletal: symmetric strength and muscle tone throughout. Neurologic: Cranial nerves II through XII are grossly intact. No obvious focalities. Speech is fluent. Coordination is intact. Psychiatric: Judgment and insight are intact. Affect is appropriate. On pelvic exam the external genitalia are unremarkable.  No lesions noted.  On speculum examination the patient is noted to have a friable lesion which completely replaces the cervical region.  Minimal bleeding at this time.  On bimanual and rectal vaginal examination the cervical mass is estimated to be approximately 4 cm.  Difficult to evaluate parametrial extension in light of the patient's body habitus.  No obvious extension into the vaginal area.  ECOG = 1  0 - Asymptomatic (Fully active, able to carry on all predisease activities without restriction)  1 - Symptomatic but completely ambulatory  (Restricted in physically strenuous activity but ambulatory and able to carry out work of a light or sedentary nature. For example, light housework, office work)  2 - Symptomatic, <50% in bed during the day (Ambulatory and capable of all self care but unable to carry out any work activities. Up and about more than 50% of waking hours)  3 - Symptomatic, >50% in bed, but not bedbound (Capable of only limited self-care, confined to bed or chair 50% or more of waking hours)  4 - Bedbound (Completely disabled. Cannot carry on any self-care. Totally confined to bed or chair)  5 - Death   Eustace Pen MM, Creech RH, Tormey DC, et al. [phone]). "Toxicity and response criteria of the [facility]". [place] Oncol. 5 (6): 649-55  LABORATORY DATA:  Lab Results  Component Value Date   WBC 2.3 (L) 10/28/2020   HGB 9.3 (L) 10/28/2020   HCT 26.7 (L) 10/28/2020   MCV 80.9 10/28/2020   PLT 153 10/28/2020   NEUTROABS 1.7 10/28/2020   Lab Results  Component Value Date   NA 138 10/28/2020   K 3.7 10/28/2020   CL 99 10/28/2020   CO2 28 10/28/2020   GLUCOSE 194 (H) 10/28/2020   CREATININE 1.01 (H) 10/28/2020   CALCIUM 8.6 (L) 10/28/2020      RADIOGRAPHY: No results found.    IMPRESSION: Clincal stage IIIC-1 (cT2, cN1, cM0) poorly differentiated cervical cancer.   The patient has completed her initial course of treatment including external beam radiation therapy and radiosensitizing chemotherapy.  The patient is now ready to proceed with high-dose-rate radiation treatments directed at the cervical region.  PLAN: The patient will be taken to the operating room on March 1 at 52 for her first intracavitary brachytherapy treatment.  The patient will undergo exam under anesthesia dilation of the cervix and placement of tandem ring apparatus preparation for high-dose-rate radiation therapy with iridium 192 is the high-dose-rate source.  Patient is scheduled to receive 5 high-dose-rate  treatments directed at the cervical region.  This will complete her definitive course of radiation therapy.  Marland Kitchen   ------------------------------------------------  Blair Promise, PhD, MD  This document serves as a record of services personally performed by Gery Pray, MD. It was created on his behalf by Clerance Lav, a trained medical scribe. The creation of this record is based on the scribe's personal observations and the provider's statements to them. This document has been checked and approved by the attending provider.

## 2020-11-09 NOTE — Interval H&P Note (Signed)
History and Physical Interval Note:  11/09/2020 8:25 AM  Misty Deleon  has presented today for surgery, with the diagnosis of cervical CA.  The various methods of treatment have been discussed with the patient and family. After consideration of risks, benefits and other options for treatment, the patient has consented to  Procedure(s): TANDEM RING INSERTION (N/A) OPERATIVE ULTRASOUND (N/A) as a surgical intervention.  The patient's history has been reviewed, patient examined, no change in status, stable for surgery.  I have reviewed the patient's chart and labs.  Questions were answered to the patient's satisfaction.     Gery Pray

## 2020-11-09 NOTE — Transfer of Care (Signed)
Immediate Anesthesia Transfer of Care Note  Patient: Misty Deleon  Procedure(s) Performed: TANDEM RING INSERTION (N/A Vagina ) OPERATIVE ULTRASOUND (N/A Abdomen)  Patient Location: PACU  Anesthesia Type:General  Level of Consciousness: awake, alert , oriented and patient cooperative  Airway & Oxygen Therapy: Patient Spontanous Breathing  Post-op Assessment: Report given to RN and Post -op Vital signs reviewed and stable  Post vital signs: Reviewed and stable  Last Vitals:  Vitals Value Taken Time  BP 102/51 11/09/20 1000  Temp    Pulse 112 11/09/20 1002  Resp 19 11/09/20 1002  SpO2 93 % 11/09/20 1002  Vitals shown include unvalidated device data.  Last Pain:  Vitals:   11/09/20 0726  TempSrc: Oral  PainSc: 0-No pain      Patients Stated Pain Goal: 8 (33/17/40 9927)  Complications: No complications documented.

## 2020-11-09 NOTE — Progress Notes (Signed)
Dr. Annye Asa by to speak with patient about IV access. IV team here to draw I stat and access port

## 2020-11-09 NOTE — Anesthesia Preprocedure Evaluation (Signed)
Anesthesia Evaluation  Patient identified by MRN, date of birth, ID band Patient awake    Reviewed: Allergy & Precautions, NPO status , Patient's Chart, lab work & pertinent test results  History of Anesthesia Complications Negative for: history of anesthetic complications  Airway Mallampati: III  TM Distance: >3 FB Neck ROM: Full  Mouth opening: Limited Mouth Opening  Dental  (+) Dental Advisory Given, Missing   Pulmonary neg pulmonary ROS,  11/08/2020 SARS coronavirus NEG   breath sounds clear to auscultation       Cardiovascular hypertension, Pt. on medications (-) angina Rhythm:Regular Rate:Normal     Neuro/Psych negative neurological ROS  negative psych ROS   GI/Hepatic GERD  Controlled,  Endo/Other  diabetes (glu 205), Oral Hypoglycemic AgentsMorbid obesitypolycystic ovarian  Renal/GU Renal InsufficiencyRenal disease   Cervical cancer    Musculoskeletal   Abdominal (+) + obese,   Peds  Hematology  (+) Blood dyscrasia (Hb 8.2), anemia ,   Anesthesia Other Findings   Reproductive/Obstetrics                             Anesthesia Physical Anesthesia Plan  ASA: III  Anesthesia Plan: General   Post-op Pain Management:    Induction: Intravenous  PONV Risk Score and Plan: 3 and Ondansetron, Dexamethasone and Scopolamine patch - Pre-op  Airway Management Planned: LMA  Additional Equipment: None  Intra-op Plan:   Post-operative Plan:   Informed Consent: I have reviewed the patients History and Physical, chart, labs and discussed the procedure including the risks, benefits and alternatives for the proposed anesthesia with the patient or authorized representative who has indicated his/her understanding and acceptance.     Dental advisory given  Plan Discussed with: CRNA and Surgeon  Anesthesia Plan Comments:         Anesthesia Quick Evaluation

## 2020-11-10 ENCOUNTER — Encounter (HOSPITAL_BASED_OUTPATIENT_CLINIC_OR_DEPARTMENT_OTHER): Payer: Self-pay | Admitting: Radiation Oncology

## 2020-11-10 ENCOUNTER — Inpatient Hospital Stay: Payer: Medicare PPO | Admitting: Hematology and Oncology

## 2020-11-10 ENCOUNTER — Other Ambulatory Visit: Payer: Self-pay

## 2020-11-10 ENCOUNTER — Inpatient Hospital Stay: Payer: Medicare PPO

## 2020-11-10 ENCOUNTER — Telehealth: Payer: Self-pay

## 2020-11-10 ENCOUNTER — Other Ambulatory Visit: Payer: Self-pay | Admitting: Hematology and Oncology

## 2020-11-10 ENCOUNTER — Inpatient Hospital Stay: Payer: Medicare PPO | Attending: Gynecologic Oncology

## 2020-11-10 VITALS — BP 132/47 | HR 87 | Temp 98.3°F | Resp 18

## 2020-11-10 DIAGNOSIS — C539 Malignant neoplasm of cervix uteri, unspecified: Secondary | ICD-10-CM | POA: Diagnosis present

## 2020-11-10 DIAGNOSIS — D61811 Other drug-induced pancytopenia: Secondary | ICD-10-CM | POA: Diagnosis not present

## 2020-11-10 DIAGNOSIS — D539 Nutritional anemia, unspecified: Secondary | ICD-10-CM

## 2020-11-10 DIAGNOSIS — D649 Anemia, unspecified: Secondary | ICD-10-CM | POA: Diagnosis not present

## 2020-11-10 LAB — BASIC METABOLIC PANEL - CANCER CENTER ONLY
Anion gap: 9 (ref 5–15)
BUN: 9 mg/dL (ref 8–23)
CO2: 26 mmol/L (ref 22–32)
Calcium: 8.7 mg/dL — ABNORMAL LOW (ref 8.9–10.3)
Chloride: 101 mmol/L (ref 98–111)
Creatinine: 1.03 mg/dL — ABNORMAL HIGH (ref 0.44–1.00)
GFR, Estimated: 59 mL/min — ABNORMAL LOW (ref >60)
Glucose, Bld: 219 mg/dL — ABNORMAL HIGH (ref 70–99)
Potassium: 3.7 mmol/L (ref 3.5–5.1)
Sodium: 136 mmol/L (ref 135–145)

## 2020-11-10 LAB — FERRITIN: Ferritin: 512 ng/mL — ABNORMAL HIGH (ref 11–307)

## 2020-11-10 LAB — IRON AND TIBC
Iron: 168 ug/dL — ABNORMAL HIGH (ref 41–142)
Saturation Ratios: 57 % (ref 21–57)
TIBC: 293 ug/dL (ref 236–444)
UIBC: 125 ug/dL (ref 120–384)

## 2020-11-10 LAB — CBC WITH DIFFERENTIAL (CANCER CENTER ONLY)
Abs Immature Granulocytes: 0.07 10*3/uL (ref 0.00–0.07)
Basophils Absolute: 0 10*3/uL (ref 0.0–0.1)
Basophils Relative: 1 %
Eosinophils Absolute: 0.2 10*3/uL (ref 0.0–0.5)
Eosinophils Relative: 5 %
HCT: 25.4 % — ABNORMAL LOW (ref 36.0–46.0)
Hemoglobin: 8.8 g/dL — ABNORMAL LOW (ref 12.0–15.0)
Immature Granulocytes: 2 %
Lymphocytes Relative: 13 %
Lymphs Abs: 0.5 10*3/uL — ABNORMAL LOW (ref 0.7–4.0)
MCH: 29.3 pg (ref 26.0–34.0)
MCHC: 34.6 g/dL (ref 30.0–36.0)
MCV: 84.7 fL (ref 80.0–100.0)
Monocytes Absolute: 0.5 10*3/uL (ref 0.1–1.0)
Monocytes Relative: 13 %
Neutro Abs: 2.5 10*3/uL (ref 1.7–7.7)
Neutrophils Relative %: 66 %
Platelet Count: 189 10*3/uL (ref 150–400)
RBC: 3 MIL/uL — ABNORMAL LOW (ref 3.87–5.11)
RDW: 21.5 % — ABNORMAL HIGH (ref 11.5–15.5)
WBC Count: 3.8 10*3/uL — ABNORMAL LOW (ref 4.0–10.5)
nRBC: 0.8 % — ABNORMAL HIGH (ref 0.0–0.2)

## 2020-11-10 LAB — SAMPLE TO BLOOD BANK

## 2020-11-10 LAB — MAGNESIUM: Magnesium: 1.5 mg/dL — ABNORMAL LOW (ref 1.7–2.4)

## 2020-11-10 LAB — PREPARE RBC (CROSSMATCH)

## 2020-11-10 LAB — VITAMIN B12: Vitamin B-12: 390 pg/mL (ref 180–914)

## 2020-11-10 LAB — ABO/RH: ABO/RH(D): O POS

## 2020-11-10 MED ORDER — MAGNESIUM SULFATE 4 GM/100ML IV SOLN
4.0000 g | Freq: Once | INTRAVENOUS | Status: AC
  Administered 2020-11-10: 4 g via INTRAVENOUS
  Filled 2020-11-10: qty 100

## 2020-11-10 MED ORDER — SODIUM CHLORIDE 0.9% FLUSH
10.0000 mL | Freq: Once | INTRAVENOUS | Status: AC
  Administered 2020-11-10: 10 mL
  Filled 2020-11-10: qty 10

## 2020-11-10 MED ORDER — HEPARIN SOD (PORK) LOCK FLUSH 100 UNIT/ML IV SOLN
500.0000 [IU] | Freq: Once | INTRAVENOUS | Status: AC
  Administered 2020-11-10: 500 [IU]
  Filled 2020-11-10: qty 5

## 2020-11-10 MED ORDER — SODIUM CHLORIDE 0.9% IV SOLUTION
250.0000 mL | Freq: Once | INTRAVENOUS | Status: DC
  Filled 2020-11-10: qty 250

## 2020-11-10 MED ORDER — DIPHENHYDRAMINE HCL 25 MG PO CAPS
ORAL_CAPSULE | ORAL | Status: AC
  Filled 2020-11-10: qty 2

## 2020-11-10 MED ORDER — ACETAMINOPHEN 325 MG PO TABS
650.0000 mg | ORAL_TABLET | Freq: Once | ORAL | Status: AC
  Administered 2020-11-10: 650 mg via ORAL

## 2020-11-10 MED ORDER — SODIUM CHLORIDE 0.9 % IV SOLN
Freq: Once | INTRAVENOUS | Status: AC
  Filled 2020-11-10: qty 250

## 2020-11-10 MED ORDER — ACETAMINOPHEN 325 MG PO TABS
ORAL_TABLET | ORAL | Status: AC
  Filled 2020-11-10: qty 2

## 2020-11-10 MED ORDER — SODIUM CHLORIDE 0.9% FLUSH
3.0000 mL | INTRAVENOUS | Status: DC | PRN
  Filled 2020-11-10: qty 10

## 2020-11-10 MED ORDER — HEPARIN SOD (PORK) LOCK FLUSH 100 UNIT/ML IV SOLN
500.0000 [IU] | Freq: Once | INTRAVENOUS | Status: DC
  Filled 2020-11-10: qty 5

## 2020-11-10 MED ORDER — DIPHENHYDRAMINE HCL 25 MG PO CAPS
25.0000 mg | ORAL_CAPSULE | Freq: Once | ORAL | Status: AC
  Administered 2020-11-10: 25 mg via ORAL

## 2020-11-10 NOTE — Patient Instructions (Signed)
Blood Transfusion, Adult, Care After This sheet gives you information about how to care for yourself after your procedure. Your doctor may also give you more specific instructions. If you have problems or questions, contact your doctor. What can I expect after the procedure? After the procedure, it is common to have:  Bruising and soreness at the IV site.  A fever or chills on the day of the procedure. This may be your body's response to the new blood cells received.  A headache. Follow these instructions at home: Insertion site care  Follow instructions from your doctor about how to take care of your insertion site. This is where an IV tube was put into your vein. Make sure you: ? Wash your hands with soap and water before and after you change your bandage (dressing). If you cannot use soap and water, use hand sanitizer. ? Change your bandage as told by your doctor.  Check your insertion site every day for signs of infection. Check for: ? Redness, swelling, or pain. ? Bleeding from the site. ? Warmth. ? Pus or a bad smell.      General instructions  Take over-the-counter and prescription medicines only as told by your doctor.  Rest as told by your doctor.  Go back to your normal activities as told by your doctor.  Keep all follow-up visits as told by your doctor. This is important. Contact a doctor if:  You have itching or red, swollen areas of skin (hives).  You feel worried or nervous (anxious).  You feel weak after doing your normal activities.  You have redness, swelling, warmth, or pain around the insertion site.  You have blood coming from the insertion site, and the blood does not stop with pressure.  You have pus or a bad smell coming from the insertion site. Get help right away if:  You have signs of a serious reaction. This may be coming from an allergy or the body's defense system (immune system). Signs include: ? Trouble breathing or shortness of  breath. ? Swelling of the face or feeling warm (flushed). ? Fever or chills. ? Head, chest, or back pain. ? Dark pee (urine) or blood in the pee. ? Widespread rash. ? Fast heartbeat. ? Feeling dizzy or light-headed. You may receive your blood transfusion in an outpatient setting. If so, you will be told whom to contact to report any reactions. These symptoms may be an emergency. Do not wait to see if the symptoms will go away. Get medical help right away. Call your local emergency services (911 in the [place] Do not drive yourself to the hospital. Summary  Bruising and soreness at the IV site are common.  Check your insertion site every day for signs of infection.  Rest as told by your doctor. Go back to your normal activities as told by your doctor.  Get help right away if you have signs of a serious reaction. This information is not intended to replace advice given to you by your health care provider. Make sure you discuss any questions you have with your health care provider. Document Revised: 02/20/2019 Document Reviewed: 02/20/2019 Elsevier Patient Education  2021 [employer].  Magnesium Sulfate injection What is this medicine? MAGNESIUM SULFATE (mag NEE zee um SUL fate) is an electrolyte injection commonly used to treat low magnesium levels in your blood. It is also used to prevent or control seizures in women with preeclampsia or eclampsia. This medicine may be used for other purposes; ask your  health care provider or pharmacist if you have questions. What should I tell my health care provider before I take this medicine? They need to know if you have any of these conditions:  heart disease  history of irregular heart beat  kidney disease  an unusual or allergic reaction to magnesium sulfate, medicines, foods, dyes, or preservatives  pregnant or trying to get pregnant  breast-feeding How should I use this medicine? This medicine is for infusion into a vein. It is  given by a health care professional in a hospital or clinic setting. Talk to your pediatrician regarding the use of this medicine in children. While this drug may be prescribed for selected conditions, precautions do apply. Overdosage: If you think you have taken too much of this medicine contact a poison control center or emergency room at once. NOTE: This medicine is only for you. Do not share this medicine with others. What if I miss a dose? This does not apply. What may interact with this medicine? This medicine may interact with the following medications:  certain medicines for anxiety or sleep  certain medicines for seizures like phenobarbital  digoxin  medicines that relax muscles for surgery  narcotic medicines for pain This list may not describe all possible interactions. Give your health care provider a list of all the medicines, herbs, non-prescription drugs, or dietary supplements you use. Also tell them if you smoke, drink alcohol, or use illegal drugs. Some items may interact with your medicine. What should I watch for while using this medicine? Your condition will be monitored carefully while you are receiving this medicine. You may need blood work done while you are receiving this medicine. What side effects may I notice from receiving this medicine? Side effects that you should report to your doctor or health care professional as soon as possible:  allergic reactions like skin rash, itching or hives, swelling of the face, lips, or tongue  facial flushing  muscle weakness  signs and symptoms of low blood pressure like dizziness; feeling faint or lightheaded, falls; unusually weak or tired  signs and symptoms of a dangerous change in heartbeat or heart rhythm like chest pain; dizziness; fast or irregular heartbeat; palpitations; breathing problems  sweating This list may not describe all possible side effects. Call your doctor for medical advice about side effects.  You may report side effects to FDA at 1-800-FDA-1088. Where should I keep my medicine? This drug is given in a hospital or clinic and will not be stored at home. NOTE: This sheet is a summary. It may not cover all possible information. If you have questions about this medicine, talk to your doctor, pharmacist, or health care provider.  2021 Elsevier/Gold Standard (2016-03-15 12:31:42)

## 2020-11-10 NOTE — Telephone Encounter (Signed)
Called and scheduled appts for today for 1 unit of blood and see Dr. Alvy Bimler. She is aware of appt times.

## 2020-11-11 ENCOUNTER — Encounter: Payer: Self-pay | Admitting: Hematology and Oncology

## 2020-11-11 ENCOUNTER — Other Ambulatory Visit: Payer: Self-pay

## 2020-11-11 ENCOUNTER — Encounter (HOSPITAL_BASED_OUTPATIENT_CLINIC_OR_DEPARTMENT_OTHER): Payer: Self-pay | Admitting: Radiation Oncology

## 2020-11-11 LAB — BPAM RBC
Blood Product Expiration Date: 202203312359
ISSUE DATE / TIME: 202203021603
Unit Type and Rh: 5100

## 2020-11-11 LAB — TYPE AND SCREEN
ABO/RH(D): O POS
Antibody Screen: NEGATIVE
Unit division: 0

## 2020-11-11 NOTE — Assessment & Plan Note (Signed)
She has significant anemia despite IV iron and vitamin B12 replacement therapy To optimize benefit from radiation, I recommend we proceed with 1 unit of blood transfusion and she is in agreement We discussed some of the risks, benefits, and alternatives of blood transfusions. The patient is symptomatic from anemia and the hemoglobin level is critically low.  Some of the side-effects to be expected including risks of transfusion reactions, chills, infection, syndrome of volume overload and risk of hospitalization from various reasons and the patient is willing to proceed and went ahead to sign consent today.

## 2020-11-11 NOTE — Assessment & Plan Note (Signed)
She has persistent significant pancytopenia despite stopping chemotherapy We discussed the risk and benefits of aggressive supportive care and she is in agreement

## 2020-11-11 NOTE — Progress Notes (Signed)
[place] OFFICE PROGRESS NOTE  Patient Care Team: Alvester Chou, NP as PCP - General (Nurse Practitioner) Jacqulyn Liner, RN as Oncology Nurse Navigator (Oncology)  ASSESSMENT & PLAN:  Cervical cancer [facility]) She has persistent significant pancytopenia despite stopping chemotherapy We discussed the risk and benefits of aggressive supportive care and she is in agreement  Deficiency anemia She has significant anemia despite IV iron and vitamin B12 replacement therapy To optimize benefit from radiation, I recommend we proceed with 1 unit of blood transfusion and she is in agreement We discussed some of the risks, benefits, and alternatives of blood transfusions. The patient is symptomatic from anemia and the hemoglobin level is critically low.  Some of the side-effects to be expected including risks of transfusion reactions, chills, infection, syndrome of volume overload and risk of hospitalization from various reasons and the patient is willing to proceed and went ahead to sign consent today.   Hypomagnesemia She has persistent low magnesium despite stopping chemotherapy We will proceed with IV magnesium replacement therapy   Orders Placed This Encounter  Procedures  . ABO/RH    Standing Status:   Future    Number of Occurrences:   1    Standing Expiration Date:   12/11/2020    All questions were answered. The patient knows to call the clinic with any problems, questions or concerns. The total time spent in the appointment was 20 minutes encounter with patients including review of chart and various tests results, discussions about plan of care and coordination of care plan   Heath Lark, MD 11/11/2020 9:42 AM  INTERVAL HISTORY: Please see below for problem oriented charting. She is seen in the infusion room It was brought to my attention that she has significant hypomagnesemia and pancytopenia while undergoing treatment She feels well Denies recent bleeding Her  appetite is fair She denies recent diarrhea  SUMMARY OF ONCOLOGIC HISTORY: Oncology History  Cervical cancer ([place])  08/13/2020 Pathology Results   Cervical biopsy showed poorly differentiated carcinoma. 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   08/27/2020 Imaging   MRI pelvis Large cervical mass with pelvic adenopathy, at least T2B N1 disease. Question of involvement of posterior urinary bladder (T4) for which additional images have been requested. Patient will return for additional images to answer this question. (Thinner section axial T2 without fat sat and sagittal T2 weighted imaging also without fat saturation.)   Small field-of-view images show that the parametrial extension comes in very close proximity to the RIGHT ureter.   08/30/2020 Initial Diagnosis   Cervical cancer ([place])   08/30/2020 Cancer Staging   Staging form: Cervix Uteri, AJCC Version 9 - Clinical stage from 08/30/2020: Stage IIIC1 (cT2, cN1, cM0) - Signed by Heath Lark, MD on 08/30/2020   09/01/2020 PET scan   1. Large cervical mass extending into the endometrial canal to the level of the uterine fundus, associated with pelvic adenopathy to the level of the RIGHT common iliac chain. Please refer to MRI for further detail. 2. No signs of solid organ uptake or metastatic disease to the chest. 3. Subcutaneous nodule with marked increased metabolic activity, correlation with direct clinical inspection is suggested to exclude cutaneous neoplasm or subcutaneous nodule in this location. Complicated sebaceous cyst could also potentially have this appearance. 4. Uptake in the anal canal could likely physiologic. Given the intense nature of the uptake would suggest correlation with direct clinical inspection/examination. 5. Uptake in axillary lymph nodes with activity less  than mediastinal blood pool likely small reactive lymph nodes, attention on follow-up. Morphologic features also with benign  appearance.   09/16/2020 Procedure   Successful placement of a power injectable Port-A-Cath via the right internal jugular vein. The catheter is ready for immediate use.     09/20/2020 - 10/25/2020 Chemotherapy         Malignant neoplasm of cervix ([place])  09/01/2020 Initial Diagnosis   Malignant neoplasm of cervix ([place])   09/20/2020 - 10/25/2020 Chemotherapy           REVIEW OF SYSTEMS:   Constitutional: Denies fevers, chills or abnormal weight loss Eyes: Denies blurriness of vision Ears, nose, mouth, throat, and face: Denies mucositis or sore throat Respiratory: Denies cough, dyspnea or wheezes Cardiovascular: Denies palpitation, chest discomfort or lower extremity swelling Gastrointestinal:  Denies nausea, heartburn or change in bowel habits Skin: Denies abnormal skin rashes Lymphatics: Denies new lymphadenopathy or easy bruising Neurological:Denies numbness, tingling or new weaknesses Behavioral/Psych: Mood is stable, no new changes  All other systems were reviewed with the patient and are negative.  I have reviewed the past medical history, past surgical history, social history and family history with the patient and they are unchanged from previous note.  ALLERGIES:  has No Known Allergies.  MEDICATIONS:  Current Outpatient Medications  Medication Sig Dispense Refill  . amLODipine (NORVASC) 10 MG tablet Take 1 tablet (10 mg total) by mouth daily. (Patient taking differently: Take 10 mg by mouth at bedtime.) 30 tablet 11  . calcium carbonate (TUMS - DOSED IN MG ELEMENTAL CALCIUM) 500 MG chewable tablet Chew 1 tablet by mouth as needed for indigestion or heartburn.    . ezetimibe (ZETIA) 10 MG tablet Take 10 mg by mouth daily.    Marland Kitchen lidocaine-prilocaine (EMLA) cream Apply to affected area once 30 g 3  . magnesium oxide (MAG-OX) 400 (241.3 Mg) MG tablet Take 1 tablet (400 mg total) by mouth 2 (two) times daily. 60 tablet 11  . metFORMIN (GLUCOPHAGE) 1000 MG tablet Take 1,000  mg by mouth 2 (two) times daily with a meal.    . ondansetron (ZOFRAN) 8 MG tablet Take 1 tablet (8 mg total) by mouth every 8 (eight) hours as needed. Start on the third day after cisplatin chemotherapy. 30 tablet 1  . prochlorperazine (COMPAZINE) 10 MG tablet Take 1 tablet (10 mg total) by mouth every 6 (six) hours as needed (Nausea or vomiting). 30 tablet 1  . rosuvastatin (CRESTOR) 40 MG tablet Take 40 mg by mouth at bedtime.    . sitaGLIPtin (JANUVIA) 50 MG tablet Take 50 mg by mouth 2 (two) times daily.     No current facility-administered medications for this visit.    PHYSICAL EXAMINATION: ECOG PERFORMANCE STATUS: 1 - Symptomatic but completely ambulatory  GENERAL:alert, no distress and comfortable   LABORATORY DATA:  I have reviewed the data as listed    Component Value Date/Time   NA 136 11/10/2020 1246   K 3.7 11/10/2020 1246   CL 101 11/10/2020 1246   CO2 26 11/10/2020 1246   GLUCOSE 219 (H) 11/10/2020 1246   BUN 9 11/10/2020 1246   CREATININE 1.03 (H) 11/10/2020 1246   CALCIUM 8.7 (L) 11/10/2020 1246   GFRNONAA 59 (L) 11/10/2020 1246    No results found for: SPEP, UPEP  Lab Results  Component Value Date   WBC 3.8 (L) 11/10/2020   NEUTROABS 2.5 11/10/2020   HGB 8.8 (L) 11/10/2020   HCT 25.4 (L) 11/10/2020   MCV  84.7 11/10/2020   PLT 189 11/10/2020      Chemistry      Component Value Date/Time   NA 136 11/10/2020 1246   K 3.7 11/10/2020 1246   CL 101 11/10/2020 1246   CO2 26 11/10/2020 1246   BUN 9 11/10/2020 1246   CREATININE 1.03 (H) 11/10/2020 1246      Component Value Date/Time   CALCIUM 8.7 (L) 11/10/2020 1246

## 2020-11-11 NOTE — Assessment & Plan Note (Signed)
She has persistent low magnesium despite stopping chemotherapy We will proceed with IV magnesium replacement therapy

## 2020-11-11 NOTE — Progress Notes (Addendum)
ADDENDUM:  Pt's surgery start time on 11-16-2020 for Dr Sondra Come has changed to 0730.  Called and spoke w/ pt , whom verbalized understanding to arrive at 0530 on same date 11-16-2020 and by npo after mn w/ exception clear liquids until 0430 .  Spoke w/ via phone for pre-op interview--- PT Lab needs dos---- Istat(per anes)/  Pre-op orders pending             Lab results------ current ekg in epic COVID test ------ 11-15-2020 @ 0845 Arrive at ------- 0745 on 11-09-2020 NPO after MN NO Solid Food.  Clear liquids from MN until--- 0645 Medications to take morning of surgery ----- NONE   Diabetic medication ----- do not take metformin/ januvia morning of surgery Patient Special Instructions ----- n/a Pre-Op special Istructions --- need to notify  IV therapy team to access pt's PAC in pre-op dos.  Called Dr Sondra Come office requested pre-op orders with his ok to use PAC. Patient verbalized understanding of instructions that were given at this phone interview. Patient denies shortness of breath, chest pain, fever, cough at this phone interview.

## 2020-11-15 ENCOUNTER — Other Ambulatory Visit (HOSPITAL_COMMUNITY)
Admission: RE | Admit: 2020-11-15 | Discharge: 2020-11-15 | Disposition: A | Discharge status: Home / Self Care | Payer: Medicare PPO | Source: Ambulatory Visit | Attending: Radiation Oncology | Admitting: Radiation Oncology

## 2020-11-15 DIAGNOSIS — Z20822 Contact with and (suspected) exposure to covid-19: Secondary | ICD-10-CM | POA: Diagnosis not present

## 2020-11-15 DIAGNOSIS — Z01812 Encounter for preprocedural laboratory examination: Secondary | ICD-10-CM | POA: Diagnosis present

## 2020-11-15 LAB — SARS CORONAVIRUS 2 (TAT 6-24 HRS): SARS Coronavirus 2: NEGATIVE

## 2020-11-15 NOTE — Anesthesia Preprocedure Evaluation (Addendum)
Anesthesia Evaluation  Patient identified by MRN, date of birth, ID band Patient awake    Reviewed: Allergy & Precautions, NPO status , Patient's Chart, lab work & pertinent test results  Airway Mallampati: II  TM Distance: >3 FB Neck ROM: Full    Dental  (+) Dental Advisory Given, Teeth Intact   Pulmonary neg pulmonary ROS,    Pulmonary exam normal breath sounds clear to auscultation       Cardiovascular hypertension, Pt. on medications Normal cardiovascular exam Rhythm:Regular Rate:Normal     Neuro/Psych negative neurological ROS     GI/Hepatic Neg liver ROS, GERD  ,  Endo/Other  diabetes  Renal/GU Renal disease     Musculoskeletal negative musculoskeletal ROS (+)   Abdominal   Peds  Hematology negative hematology ROS (+) anemia ,   Anesthesia Other Findings   Reproductive/Obstetrics                          Anesthesia Physical Anesthesia Plan  ASA: III  Anesthesia Plan: General   Post-op Pain Management:    Induction: Intravenous  PONV Risk Score and Plan: 4 or greater and Dexamethasone, Ondansetron, Midazolam, Scopolamine patch - Pre-op and Treatment may vary due to age or medical condition  Airway Management Planned: LMA  Additional Equipment: None  Intra-op Plan:   Post-operative Plan: Extubation in OR  Informed Consent: I have reviewed the patients History and Physical, chart, labs and discussed the procedure including the risks, benefits and alternatives for the proposed anesthesia with the patient or authorized representative who has indicated his/her understanding and acceptance.     Dental advisory given  Plan Discussed with: CRNA  Anesthesia Plan Comments:        Anesthesia Quick Evaluation

## 2020-11-15 NOTE — Progress Notes (Signed)
  Radiation Oncology         [phone] ________________________________  Name: Misty Deleon MRN: [id number]  Date: 11/16/2020  DOB: May 14, 1951  CC: Alvester Chou, NP  Alvester Chou, NP  HDR BRACHYTHERAPY NOTE  DIAGNOSIS: Clincal stage IIIC-1 (cT2, cN1, cM0) poorly differentiated cervical cancer  Simple treatment device note: On the operating room the patient had construction of her custom tandem cylinder system. She will be treated with a 45 tandem/cylinder system with a 2.5 cm diameter segmented cylinder. This conforms to her anatomy without undue discomfort.  Vaginal brachytherapy procedure node: The patient was brought to the [place] suite. Identity was confirmed. All relevant records and images related to the planned course of therapy were reviewed. The patient freely provided informed written consent to proceed with treatment after reviewing the details related to the planned course of therapy. The consent form was witnessed and verified by the simulation staff. Then, the patient was set-up in a stable reproducible supine position for radiation therapy. The tandem cylinder system was accessed and fiducial markers were placed within the tandem and cylinder.   Verification simulation note: An AP and lateral film was obtained through the pelvis area. This was compared to the patient's planning films documenting accurate position of the tandem/cylinder system for treatment.  High-dose-rate brachytherapy treatment note:   The remote afterloading device was accessed through catheter system and attached to the tandem cylinder system. Patient then proceeded to undergo her second high-dose-rate treatment directed at the cervix. The patient was prescribed a dose of 5.5 gray to be delivered to the high risk clinical target  volume.  Patient was treated with 1 channels using 16 dwell positions. Treatment time was 379.8 seconds. The patient tolerated the procedure well. After completion of her therapy,  a radiation survey was performed documenting return of the iridium source into the Gamma[employer] safe. The patient was then transferred to the nursing suite.  She then had removal of  the tandem and cylinder system. The patient tolerated the removal well.  PLAN: The patient will return next week for her third high-dose-rate treatment directed at the cervical region. ________________________________     Blair Promise, PhD, MD  This document serves as a record of services personally performed by Gery Pray, MD. It was created on his behalf by Clerance Lav, a trained medical scribe. The creation of this record is based on the scribe's personal observations and the provider's statements to them. This document has been checked and approved by the attending provider.

## 2020-11-16 ENCOUNTER — Other Ambulatory Visit: Payer: Self-pay

## 2020-11-16 ENCOUNTER — Ambulatory Visit (HOSPITAL_BASED_OUTPATIENT_CLINIC_OR_DEPARTMENT_OTHER): Payer: Medicare PPO | Admitting: Anesthesiology

## 2020-11-16 ENCOUNTER — Ambulatory Visit (HOSPITAL_COMMUNITY)
Admission: RE | Admit: 2020-11-16 | Discharge: 2020-11-16 | Disposition: A | Discharge status: Home / Self Care | Payer: Medicare PPO | Source: Ambulatory Visit | Attending: Radiation Oncology | Admitting: Radiation Oncology

## 2020-11-16 ENCOUNTER — Ambulatory Visit (HOSPITAL_BASED_OUTPATIENT_CLINIC_OR_DEPARTMENT_OTHER)
Admission: RE | Admit: 2020-11-16 | Discharge: 2020-11-16 | Disposition: A | Discharge status: Other Acute Inpatient Hospital | Payer: Medicare PPO | Attending: Radiation Oncology | Admitting: Radiation Oncology

## 2020-11-16 ENCOUNTER — Other Ambulatory Visit (HOSPITAL_COMMUNITY): Payer: Medicare PPO

## 2020-11-16 ENCOUNTER — Ambulatory Visit
Admission: RE | Admit: 2020-11-16 | Discharge: 2020-11-16 | Disposition: A | Discharge status: Home / Self Care | Payer: Medicare PPO | Source: Ambulatory Visit | Attending: Radiation Oncology | Admitting: Radiation Oncology

## 2020-11-16 ENCOUNTER — Encounter (HOSPITAL_BASED_OUTPATIENT_CLINIC_OR_DEPARTMENT_OTHER)
Admission: RE | Disposition: A | Discharge status: Other Acute Inpatient Hospital | Payer: Self-pay | Source: Home / Self Care | Attending: Radiation Oncology

## 2020-11-16 ENCOUNTER — Encounter (HOSPITAL_BASED_OUTPATIENT_CLINIC_OR_DEPARTMENT_OTHER): Payer: Self-pay | Admitting: Radiation Oncology

## 2020-11-16 VITALS — BP 123/59 | HR 92 | Temp 96.9°F | Resp 18

## 2020-11-16 DIAGNOSIS — Z923 Personal history of irradiation: Secondary | ICD-10-CM | POA: Diagnosis not present

## 2020-11-16 DIAGNOSIS — C539 Malignant neoplasm of cervix uteri, unspecified: Secondary | ICD-10-CM | POA: Insufficient documentation

## 2020-11-16 DIAGNOSIS — Z9221 Personal history of antineoplastic chemotherapy: Secondary | ICD-10-CM | POA: Insufficient documentation

## 2020-11-16 DIAGNOSIS — Z833 Family history of diabetes mellitus: Secondary | ICD-10-CM | POA: Diagnosis not present

## 2020-11-16 DIAGNOSIS — Z808 Family history of malignant neoplasm of other organs or systems: Secondary | ICD-10-CM | POA: Insufficient documentation

## 2020-11-16 HISTORY — PX: OPERATIVE ULTRASOUND: SHX5996

## 2020-11-16 HISTORY — PX: TANDEM RING INSERTION: SHX6199

## 2020-11-16 LAB — POCT I-STAT, CHEM 8
BUN: 10 mg/dL (ref 8–23)
Calcium, Ion: 1.26 mmol/L (ref 1.15–1.40)
Chloride: 98 mmol/L (ref 98–111)
Creatinine, Ser: 0.9 mg/dL (ref 0.44–1.00)
Glucose, Bld: 165 mg/dL — ABNORMAL HIGH (ref 70–99)
HCT: 28 % — ABNORMAL LOW (ref 36.0–46.0)
Hemoglobin: 9.5 g/dL — ABNORMAL LOW (ref 12.0–15.0)
Potassium: 3.8 mmol/L (ref 3.5–5.1)
Sodium: 138 mmol/L (ref 135–145)
TCO2: 29 mmol/L (ref 22–32)

## 2020-11-16 LAB — GLUCOSE, CAPILLARY: Glucose-Capillary: 150 mg/dL — ABNORMAL HIGH (ref 70–99)

## 2020-11-16 SURGERY — INSERTION, UTERINE TANDEM AND RING OR CYLINDER, FOR BRACHYTHERAPY
Anesthesia: General | Site: Vagina

## 2020-11-16 MED ORDER — FENTANYL CITRATE (PF) 100 MCG/2ML IJ SOLN: 25.0000 ug | INTRAMUSCULAR | Status: DC | PRN

## 2020-11-16 MED ORDER — SODIUM CHLORIDE 0.9% FLUSH: 10.0000 mL | INTRAVENOUS | Status: DC | PRN

## 2020-11-16 MED ORDER — POVIDONE-IODINE 10 % EX SWAB: 2.0000 "application " | Freq: Once | CUTANEOUS | Status: DC

## 2020-11-16 MED ORDER — SODIUM CHLORIDE 0.9 % IV SOLN: INTRAVENOUS | Status: DC

## 2020-11-16 MED ORDER — PHENYLEPHRINE 40 MCG/ML (10ML) SYRINGE FOR IV PUSH (FOR BLOOD PRESSURE SUPPORT)
PREFILLED_SYRINGE | INTRAVENOUS | Status: DC | PRN
  Administered 2020-11-16: 160 ug via INTRAVENOUS
  Administered 2020-11-16 (×2): 120 ug via INTRAVENOUS

## 2020-11-16 MED ORDER — ONDANSETRON HCL 4 MG/2ML IJ SOLN
INTRAMUSCULAR | Status: DC | PRN
  Administered 2020-11-16: 4 mg via INTRAVENOUS

## 2020-11-16 MED ORDER — PROPOFOL 10 MG/ML IV BOLUS
INTRAVENOUS | Status: AC
  Filled 2020-11-16: qty 20

## 2020-11-16 MED ORDER — FENTANYL CITRATE (PF) 100 MCG/2ML IJ SOLN
INTRAMUSCULAR | Status: AC
  Filled 2020-11-16: qty 2

## 2020-11-16 MED ORDER — MIDAZOLAM HCL 2 MG/2ML IJ SOLN
INTRAMUSCULAR | Status: DC | PRN
  Administered 2020-11-16: 1 mg via INTRAVENOUS

## 2020-11-16 MED ORDER — EPHEDRINE SULFATE-NACL 50-0.9 MG/10ML-% IV SOSY
PREFILLED_SYRINGE | INTRAVENOUS | Status: DC | PRN
  Administered 2020-11-16: 10 mg via INTRAVENOUS

## 2020-11-16 MED ORDER — ONDANSETRON HCL 4 MG/2ML IJ SOLN
INTRAMUSCULAR | Status: AC
  Filled 2020-11-16: qty 2

## 2020-11-16 MED ORDER — PHENYLEPHRINE 40 MCG/ML (10ML) SYRINGE FOR IV PUSH (FOR BLOOD PRESSURE SUPPORT)
PREFILLED_SYRINGE | INTRAVENOUS | Status: AC
  Filled 2020-11-16: qty 10

## 2020-11-16 MED ORDER — DEXAMETHASONE SODIUM PHOSPHATE 10 MG/ML IJ SOLN
INTRAMUSCULAR | Status: AC
  Filled 2020-11-16: qty 1

## 2020-11-16 MED ORDER — SODIUM CHLORIDE 0.9 % IR SOLN
Status: DC | PRN
  Administered 2020-11-16: 250 mL

## 2020-11-16 MED ORDER — PROPOFOL 10 MG/ML IV BOLUS
INTRAVENOUS | Status: DC | PRN
  Administered 2020-11-16: 150 mg via INTRAVENOUS

## 2020-11-16 MED ORDER — CHLORHEXIDINE GLUCONATE CLOTH 2 % EX PADS: 6.0000 | MEDICATED_PAD | Freq: Every day | CUTANEOUS | Status: DC

## 2020-11-16 MED ORDER — DEXAMETHASONE SODIUM PHOSPHATE 10 MG/ML IJ SOLN
INTRAMUSCULAR | Status: DC | PRN
  Administered 2020-11-16: 5 mg via INTRAVENOUS

## 2020-11-16 MED ORDER — MIDAZOLAM HCL 2 MG/2ML IJ SOLN
INTRAMUSCULAR | Status: AC
  Filled 2020-11-16: qty 2

## 2020-11-16 MED ORDER — FENTANYL CITRATE (PF) 100 MCG/2ML IJ SOLN
INTRAMUSCULAR | Status: DC | PRN
  Administered 2020-11-16: 50 ug via INTRAVENOUS
  Administered 2020-11-16 (×2): 25 ug via INTRAVENOUS

## 2020-11-16 MED ORDER — LIDOCAINE 2% (20 MG/ML) 5 ML SYRINGE
INTRAMUSCULAR | Status: AC
  Filled 2020-11-16: qty 5

## 2020-11-16 MED ORDER — ACETAMINOPHEN 10 MG/ML IV SOLN
INTRAVENOUS | Status: DC | PRN
  Administered 2020-11-16: 1000 mg via INTRAVENOUS

## 2020-11-16 MED ORDER — LIDOCAINE 2% (20 MG/ML) 5 ML SYRINGE
INTRAMUSCULAR | Status: DC | PRN
  Administered 2020-11-16: 60 mg via INTRAVENOUS

## 2020-11-16 MED ORDER — ACETAMINOPHEN 10 MG/ML IV SOLN
INTRAVENOUS | Status: AC
  Filled 2020-11-16: qty 100

## 2020-11-16 MED ORDER — AMISULPRIDE (ANTIEMETIC) 5 MG/2ML IV SOLN: 10.0000 mg | Freq: Once | INTRAVENOUS | Status: DC | PRN

## 2020-11-16 MED ORDER — EPHEDRINE 5 MG/ML INJ
INTRAVENOUS | Status: AC
  Filled 2020-11-16: qty 10

## 2020-11-16 MED ORDER — ARTIFICIAL TEARS OPHTHALMIC OINT
TOPICAL_OINTMENT | OPHTHALMIC | Status: AC
  Filled 2020-11-16: qty 3.5

## 2020-11-16 SURGICAL SUPPLY — 20 items
BNDG CONFORM 2 STRL LF (GAUZE/BANDAGES/DRESSINGS) IMPLANT
COVER WAND RF STERILE (DRAPES) IMPLANT
DILATOR CANAL MILEX (MISCELLANEOUS) IMPLANT
DRSG PAD ABDOMINAL 8X10 ST (GAUZE/BANDAGES/DRESSINGS) ×3 IMPLANT
GAUZE 4X4 16PLY RFD (DISPOSABLE) ×3 IMPLANT
GLOVE SURG ENC MOIS LTX SZ7.5 (GLOVE) ×6 IMPLANT
GOWN STRL REUS W/TWL LRG LVL3 (GOWN DISPOSABLE) ×3 IMPLANT
HOLDER FOLEY CATH W/STRAP (MISCELLANEOUS) ×3 IMPLANT
IV NS 1000ML (IV SOLUTION) ×3
IV NS 1000ML BAXH (IV SOLUTION) ×2 IMPLANT
IV SET EXTENSION GRAVITY 40 LF (IV SETS) ×3 IMPLANT
KIT TURNOVER CYSTO (KITS) ×3 IMPLANT
MAT PREVALON FULL STRYKER (MISCELLANEOUS) ×3 IMPLANT
NS IRRIG 500ML POUR BTL (IV SOLUTION) ×3 IMPLANT
PACK VAGINAL MINOR WOMEN LF (CUSTOM PROCEDURE TRAY) ×3 IMPLANT
PACKING VAGINAL (PACKING) IMPLANT
PAD OB MATERNITY 4.3X12.25 (PERSONAL CARE ITEMS) IMPLANT
TOWEL OR 17X26 10 PK STRL BLUE (TOWEL DISPOSABLE) ×3 IMPLANT
TRAY FOLEY W/BAG SLVR 14FR LF (SET/KITS/TRAYS/PACK) ×3 IMPLANT
WATER STERILE IRR 500ML POUR (IV SOLUTION) IMPLANT

## 2020-11-16 NOTE — Anesthesia Procedure Notes (Signed)
Procedure Name: LMA Insertion Date/Time: 11/16/2020 7:47 AM Performed by: Mechele Claude, CRNA Pre-anesthesia Checklist: Patient identified, Emergency Drugs available, Suction available and Patient being monitored Patient Re-evaluated:Patient Re-evaluated prior to induction Oxygen Delivery Method: Circle system utilized Preoxygenation: Pre-oxygenation with 100% oxygen Induction Type: IV induction Ventilation: Mask ventilation without difficulty LMA: LMA inserted LMA Size: 3.0 Number of attempts: 1 Airway Equipment and Method: Bite block Placement Confirmation: positive ETCO2 Tube secured with: Tape Dental Injury: Teeth and Oropharynx as per pre-operative assessment

## 2020-11-16 NOTE — Anesthesia Postprocedure Evaluation (Signed)
Anesthesia Post Note  Patient: Misty Deleon  Procedure(s) Performed: TANDEM RING INSERTION (N/A Vagina ) OPERATIVE ULTRASOUND (N/A Abdomen)     Patient location during evaluation: PACU Anesthesia Type: General Level of consciousness: sedated and patient cooperative Pain management: pain level controlled Vital Signs Assessment: post-procedure vital signs reviewed and stable Respiratory status: spontaneous breathing Cardiovascular status: stable Anesthetic complications: no   No complications documented.  Last Vitals:  Vitals:   11/16/20 0915 11/16/20 0930  BP: 113/61 115/72  Pulse: 87 96  Resp: 16 16  Temp:    SpO2: 95% 96%    Last Pain:  Vitals:   11/16/20 0930  TempSrc:   PainSc: 0-No pain                 Nolon Nations

## 2020-11-16 NOTE — Interval H&P Note (Signed)
History and Physical Interval Note:  11/16/2020 7:33 AM  Misty Deleon  has presented today for surgery, with the diagnosis of cervical CA.  The various methods of treatment have been discussed with the patient and family. After consideration of risks, benefits and other options for treatment, the patient has consented to  Procedure(s): TANDEM RING INSERTION (N/A) OPERATIVE ULTRASOUND (N/A) as a surgical intervention.  The patient's history has been reviewed, patient examined, no change in status, stable for surgery.  I have reviewed the patient's chart and labs.  Questions were answered to the patient's satisfaction.     Gery Pray

## 2020-11-16 NOTE — Addendum Note (Signed)
Encounter addended by: Wilmon Arms, RN on: 11/16/2020 3:51 PM  Actions taken: Flowsheet accepted

## 2020-11-16 NOTE — Addendum Note (Signed)
Encounter addended by: Wilmon Arms, RN on: 11/16/2020 11:14 AM  Actions taken: Flowsheet accepted

## 2020-11-16 NOTE — Op Note (Signed)
11/16/2020  9:06 AM  PATIENT:  Misty Deleon  70 y.o. female   PRE-OPERATIVE DIAGNOSIS:  cervical CA  POST-OPERATIVE DIAGNOSIS:  cervical CA  PROCEDURE:  Procedure(s): TANDEM RING INSERTION (N/A) OPERATIVE ULTRASOUND (N/A)  SURGEON:  Surgeon(s) and Role:    * Gery Pray, MD - Primary  PHYSICIAN ASSISTANT:   ASSISTANTS: none   ANESTHESIA:   general  EBL:  0 mL   BLOOD ADMINISTERED:none  DRAINS: Urinary Catheter (Foley)   LOCAL MEDICATIONS USED:  NONE  SPECIMEN:  No Specimen  DISPOSITION OF SPECIMEN:  N/A  COUNTS:  YES  TOURNIQUET:  * No tourniquets in log *  DICTATION: The patient was transported to the outpatient OR room 1.  The patient was prepped and draped in the usual sterile fashion and placed in the dorsolithotomy position.  Timeout was performed for the procedure.  Patient had placement of a Foley catheter without difficulty.  The bladder was then backfilled with approximately 250 cc of sterile water for ultrasound imaging purposes.  On exam under anesthesia the patient was noted to have a bandlike constriction in the distal vaginal area that would not stretch during general anesthesia.  Given this narrow area it was determined that the tandem ring system would not work based on the patient's anatomy due to the difficulty of placing the ring within the  upper vaginal cavity.  The proximal vagina was also somewhat narrowed to the cervical os and therefore also a ring apparatus would not be able to placed adequately within the target area.  On ultrasound imaging the patient was noted to have a anteverted uterus to approximately 45 degrees.  The uterus sounded to approximately 7.5 cm. The patient then proceeded to undergo placement of a narrow speculum.  The cervical os was visualized and the cervical os was dilated without difficulty.  Ultrasound images were excellent.  Again on ultrasound measurements the cervical region measured approximately 2 cm in maximal  dimension.  After dilation of the cervical os the patient then had placement of a 60 mm approximately 45 degree tandem within the endometrial cavity and endocervical canal.  Good placement was verified on intraoperative ultrasound.  Patient then had attached to this 60 mm tandem a 2 cm diameter cylinder this was in 2 parts and a clamp was placed distally to ensure good placement.  Final intraoperative ultrasound imaging showed good placement of the tandem cylinder for high-dose-rate radiation therapy.  Patient tolerated the procedure well.  She was subsequently transported to the recovery room in stable condition.  Later in the day the patient will be taken to radiation oncology for planning and her second high-dose-rate treatment.  Patient will receive approximately 5.5 Gy to the high risk clinical target volume.  PLAN OF CARE: Transferred to radiation oncology for planning and treatment  PATIENT DISPOSITION:  PACU - hemodynamically stable.   Delay start of Pharmacological VTE agent (>24hrs) due to surgical blood loss or risk of bleeding: not applicable

## 2020-11-16 NOTE — Discharge Instructions (Signed)

## 2020-11-16 NOTE — Transfer of Care (Signed)
Immediate Anesthesia Transfer of Care Note  Patient: Misty Deleon  Procedure(s) Performed: Procedure(s) (LRB): TANDEM RING INSERTION (N/A) OPERATIVE ULTRASOUND (N/A)  Patient Location: PACU  Anesthesia Type: General  Level of Consciousness: awake, alert  and oriented  Airway & Oxygen Therapy: Patient Spontanous Breathing and Patient connected to nasal cannula oxygen  Post-op Assessment: Report given to PACU RN and Post -op Vital signs reviewed and stable  Post vital signs: Reviewed and stable  Complications: No apparent anesthesia complicationsLast Vitals:  Vitals Value Taken Time  BP 113/60 11/16/20 0830  Temp    Pulse 94 11/16/20 0830  Resp 12 11/16/20 0830  SpO2 96 % 11/16/20 0830  Vitals shown include unvalidated device data.  Last Pain:  Vitals:   11/16/20 0616  TempSrc: Oral  PainSc: 0-No pain      Patients Stated Pain Goal: 6 (87/[id number]/43 [id number])  Complications: No complications documented.

## 2020-11-17 ENCOUNTER — Encounter (HOSPITAL_BASED_OUTPATIENT_CLINIC_OR_DEPARTMENT_OTHER): Payer: Self-pay | Admitting: Radiation Oncology

## 2020-11-19 ENCOUNTER — Other Ambulatory Visit: Payer: Self-pay

## 2020-11-19 ENCOUNTER — Encounter (HOSPITAL_BASED_OUTPATIENT_CLINIC_OR_DEPARTMENT_OTHER): Payer: Self-pay | Admitting: Radiation Oncology

## 2020-11-19 NOTE — Progress Notes (Addendum)
Spoke w/ via phone for pre-op interview--- PT Lab needs dos---- Misty Deleon and EKG(per anes)/  Pre-op orders pending             Lab results------ no COVID test ------ 11-24-2020 @ 1200 Arrive at ------- 0530 on 11-25-2020 NPO after MN NO Solid Food.  Clear liquids from MN until---  0430 Med rec completed Medications to take morning of surgery ----- NONE Diabetic medication ----- do not take metformin/ januvia morning of surgery Patient instructed to bring photo id and insurance card day of surgery Patient aware to have Driver (ride ) / caregiver    for 24 hours after surgery -- husband, Misty Deleon Patient Special Instructions ----- n/a Pre-Op special Istructions ----- need to notify IV therapy team to access pt's [place].  Called Dr Sondra Come office, spoke w/ [place] Derry, requested pre-op orders w/ his ok to use [facility] Patient verbalized understanding of instructions that were given at this phone interview. Patient denies shortness of breath, chest pain, fever, cough at this phone interview.

## 2020-11-24 ENCOUNTER — Other Ambulatory Visit (HOSPITAL_COMMUNITY)
Admission: RE | Admit: 2020-11-24 | Discharge: 2020-11-24 | Disposition: A | Discharge status: Home / Self Care | Payer: Medicare PPO | Source: Ambulatory Visit | Attending: Radiation Oncology | Admitting: Radiation Oncology

## 2020-11-24 DIAGNOSIS — Z01812 Encounter for preprocedural laboratory examination: Secondary | ICD-10-CM | POA: Diagnosis present

## 2020-11-24 DIAGNOSIS — Z20822 Contact with and (suspected) exposure to covid-19: Secondary | ICD-10-CM | POA: Insufficient documentation

## 2020-11-24 LAB — SARS CORONAVIRUS 2 (TAT 6-24 HRS): SARS Coronavirus 2: NEGATIVE

## 2020-11-24 NOTE — Anesthesia Preprocedure Evaluation (Addendum)
Anesthesia Evaluation  Patient identified by MRN, date of birth, ID band Patient awake    Reviewed: Allergy & Precautions, NPO status , Patient's Chart, lab work & pertinent test results  Airway Mallampati: II  TM Distance: <3 FB Neck ROM: Full    Dental no notable dental hx.    Pulmonary neg pulmonary ROS,    Pulmonary exam normal breath sounds clear to auscultation       Cardiovascular hypertension, Pt. on medications Normal cardiovascular exam Rhythm:Regular Rate:Normal     Neuro/Psych negative neurological ROS     GI/Hepatic Neg liver ROS, GERD  ,  Endo/Other  diabetes, Well Controlled, Type 2, Oral Hypoglycemic AgentsObesity BMI 36  Renal/GU Renal disease     Musculoskeletal negative musculoskeletal ROS (+)   Abdominal   Peds negative pediatric ROS (+)  Hematology negative hematology ROS (+) anemia ,   Anesthesia Other Findings   Reproductive/Obstetrics negative OB ROS                           Anesthesia Physical  Anesthesia Plan  ASA: III  Anesthesia Plan: General   Post-op Pain Management:    Induction: Intravenous  PONV Risk Score and Plan: 4 or greater and Dexamethasone, Ondansetron, Midazolam and Treatment may vary due to age or medical condition  Airway Management Planned: LMA  Additional Equipment: None  Intra-op Plan:   Post-operative Plan: Extubation in OR  Informed Consent: I have reviewed the patients History and Physical, chart, labs and discussed the procedure including the risks, benefits and alternatives for the proposed anesthesia with the patient or authorized representative who has indicated his/her understanding and acceptance.     Dental advisory given  Plan Discussed with: CRNA, Anesthesiologist and Surgeon  Anesthesia Plan Comments: (Patient prefers to hold off on preop scop patch for now. Norton Blizzard, MD     Anesthesia team ready at  0700. Awaiting IV nurse to access port. Peripheral IV offered to patient. Patient does NOT want a PIV placed and understands it will delay her case getting started. Surgeon aware and all agree to wait. Norton Blizzard, MD  )      Anesthesia Quick Evaluation

## 2020-11-25 ENCOUNTER — Ambulatory Visit (HOSPITAL_BASED_OUTPATIENT_CLINIC_OR_DEPARTMENT_OTHER)
Admission: RE | Admit: 2020-11-25 | Discharge: 2020-11-25 | Disposition: A | Discharge status: Home / Self Care | Payer: Medicare PPO | Attending: Radiation Oncology | Admitting: Radiation Oncology

## 2020-11-25 ENCOUNTER — Ambulatory Visit: Payer: Medicare PPO | Admitting: Radiation Oncology

## 2020-11-25 ENCOUNTER — Encounter (HOSPITAL_BASED_OUTPATIENT_CLINIC_OR_DEPARTMENT_OTHER): Payer: Self-pay | Admitting: Radiation Oncology

## 2020-11-25 ENCOUNTER — Ambulatory Visit
Admission: RE | Admit: 2020-11-25 | Discharge: 2020-11-25 | Disposition: A | Discharge status: Home / Self Care | Payer: Medicare PPO | Source: Ambulatory Visit | Attending: Radiation Oncology | Admitting: Radiation Oncology

## 2020-11-25 ENCOUNTER — Ambulatory Visit (HOSPITAL_BASED_OUTPATIENT_CLINIC_OR_DEPARTMENT_OTHER): Payer: Medicare PPO | Admitting: Anesthesiology

## 2020-11-25 ENCOUNTER — Other Ambulatory Visit: Payer: Self-pay

## 2020-11-25 ENCOUNTER — Ambulatory Visit (HOSPITAL_COMMUNITY)
Admission: RE | Admit: 2020-11-25 | Discharge: 2020-11-25 | Disposition: A | Discharge status: Home / Self Care | Payer: Medicare PPO | Source: Ambulatory Visit | Attending: Radiation Oncology | Admitting: Radiation Oncology

## 2020-11-25 ENCOUNTER — Encounter (HOSPITAL_BASED_OUTPATIENT_CLINIC_OR_DEPARTMENT_OTHER)
Admission: RE | Disposition: A | Discharge status: Home / Self Care | Payer: Self-pay | Source: Home / Self Care | Attending: Radiation Oncology

## 2020-11-25 DIAGNOSIS — N183 Chronic kidney disease, stage 3 unspecified: Secondary | ICD-10-CM | POA: Diagnosis not present

## 2020-11-25 DIAGNOSIS — E1122 Type 2 diabetes mellitus with diabetic chronic kidney disease: Secondary | ICD-10-CM | POA: Insufficient documentation

## 2020-11-25 DIAGNOSIS — Z95828 Presence of other vascular implants and grafts: Secondary | ICD-10-CM

## 2020-11-25 DIAGNOSIS — E282 Polycystic ovarian syndrome: Secondary | ICD-10-CM | POA: Insufficient documentation

## 2020-11-25 DIAGNOSIS — Z5111 Encounter for antineoplastic chemotherapy: Secondary | ICD-10-CM | POA: Insufficient documentation

## 2020-11-25 DIAGNOSIS — Z833 Family history of diabetes mellitus: Secondary | ICD-10-CM | POA: Diagnosis not present

## 2020-11-25 DIAGNOSIS — I129 Hypertensive chronic kidney disease with stage 1 through stage 4 chronic kidney disease, or unspecified chronic kidney disease: Secondary | ICD-10-CM | POA: Insufficient documentation

## 2020-11-25 DIAGNOSIS — Z923 Personal history of irradiation: Secondary | ICD-10-CM | POA: Diagnosis not present

## 2020-11-25 DIAGNOSIS — C539 Malignant neoplasm of cervix uteri, unspecified: Secondary | ICD-10-CM

## 2020-11-25 DIAGNOSIS — Z808 Family history of malignant neoplasm of other organs or systems: Secondary | ICD-10-CM | POA: Insufficient documentation

## 2020-11-25 HISTORY — PX: TANDEM RING INSERTION: SHX6199

## 2020-11-25 HISTORY — PX: OPERATIVE ULTRASOUND: SHX5996

## 2020-11-25 LAB — POCT I-STAT, CHEM 8
BUN: 10 mg/dL (ref 8–23)
Calcium, Ion: 1.25 mmol/L (ref 1.15–1.40)
Chloride: 97 mmol/L — ABNORMAL LOW (ref 98–111)
Creatinine, Ser: 1 mg/dL (ref 0.44–1.00)
Glucose, Bld: 188 mg/dL — ABNORMAL HIGH (ref 70–99)
HCT: 30 % — ABNORMAL LOW (ref 36.0–46.0)
Hemoglobin: 10.2 g/dL — ABNORMAL LOW (ref 12.0–15.0)
Potassium: 3.9 mmol/L (ref 3.5–5.1)
Sodium: 136 mmol/L (ref 135–145)
TCO2: 26 mmol/L (ref 22–32)

## 2020-11-25 LAB — GLUCOSE, CAPILLARY: Glucose-Capillary: 176 mg/dL — ABNORMAL HIGH (ref 70–99)

## 2020-11-25 SURGERY — INSERTION, UTERINE TANDEM AND RING OR CYLINDER, FOR BRACHYTHERAPY
Anesthesia: General

## 2020-11-25 MED ORDER — PHENYLEPHRINE 40 MCG/ML (10ML) SYRINGE FOR IV PUSH (FOR BLOOD PRESSURE SUPPORT)
PREFILLED_SYRINGE | INTRAVENOUS | Status: DC | PRN
  Administered 2020-11-25 (×5): 40 ug via INTRAVENOUS

## 2020-11-25 MED ORDER — SODIUM CHLORIDE 0.9% FLUSH
10.0000 mL | INTRAVENOUS | Status: DC | PRN
  Administered 2020-11-25: 10 mL via INTRAVENOUS

## 2020-11-25 MED ORDER — FENTANYL CITRATE (PF) 100 MCG/2ML IJ SOLN
INTRAMUSCULAR | Status: AC
  Filled 2020-11-25: qty 2

## 2020-11-25 MED ORDER — ACETAMINOPHEN 500 MG PO TABS
ORAL_TABLET | ORAL | Status: AC
  Filled 2020-11-25: qty 2

## 2020-11-25 MED ORDER — SODIUM CHLORIDE 0.9 % IV SOLN: INTRAVENOUS | Status: DC

## 2020-11-25 MED ORDER — DEXAMETHASONE SODIUM PHOSPHATE 10 MG/ML IJ SOLN
INTRAMUSCULAR | Status: AC
  Filled 2020-11-25: qty 1

## 2020-11-25 MED ORDER — LIDOCAINE 2% (20 MG/ML) 5 ML SYRINGE
INTRAMUSCULAR | Status: AC
  Filled 2020-11-25: qty 5

## 2020-11-25 MED ORDER — SCOPOLAMINE 1 MG/3DAYS TD PT72
MEDICATED_PATCH | TRANSDERMAL | Status: AC
  Filled 2020-11-25: qty 1

## 2020-11-25 MED ORDER — FENTANYL CITRATE (PF) 100 MCG/2ML IJ SOLN: 25.0000 ug | INTRAMUSCULAR | Status: DC | PRN

## 2020-11-25 MED ORDER — POVIDONE-IODINE 10 % EX SWAB: 2.0000 "application " | Freq: Once | CUTANEOUS | Status: DC

## 2020-11-25 MED ORDER — EPHEDRINE SULFATE-NACL 50-0.9 MG/10ML-% IV SOSY
PREFILLED_SYRINGE | INTRAVENOUS | Status: DC | PRN
  Administered 2020-11-25 (×4): 5 mg via INTRAVENOUS

## 2020-11-25 MED ORDER — ONDANSETRON HCL 4 MG/2ML IJ SOLN
INTRAMUSCULAR | Status: AC
  Filled 2020-11-25: qty 2

## 2020-11-25 MED ORDER — PROMETHAZINE HCL 25 MG/ML IJ SOLN: 6.2500 mg | INTRAMUSCULAR | Status: DC | PRN

## 2020-11-25 MED ORDER — SCOPOLAMINE 1 MG/3DAYS TD PT72: 1.0000 | MEDICATED_PATCH | TRANSDERMAL | Status: DC

## 2020-11-25 MED ORDER — SODIUM CHLORIDE 0.9% FLUSH: 10.0000 mL | INTRAVENOUS | Status: DC | PRN

## 2020-11-25 MED ORDER — HEPARIN SOD (PORK) LOCK FLUSH 100 UNIT/ML IV SOLN
500.0000 [IU] | Freq: Once | INTRAVENOUS | Status: AC
  Administered 2020-11-25: 500 [IU] via INTRAVENOUS

## 2020-11-25 MED ORDER — CHLORHEXIDINE GLUCONATE CLOTH 2 % EX PADS: 6.0000 | MEDICATED_PAD | Freq: Every day | CUTANEOUS | Status: DC

## 2020-11-25 MED ORDER — SODIUM CHLORIDE 0.9% FLUSH: 10.0000 mL | Freq: Two times a day (BID) | INTRAVENOUS | Status: DC

## 2020-11-25 MED ORDER — MIDAZOLAM HCL 5 MG/5ML IJ SOLN
INTRAMUSCULAR | Status: DC | PRN
  Administered 2020-11-25 (×2): 1 mg via INTRAVENOUS

## 2020-11-25 MED ORDER — AMISULPRIDE (ANTIEMETIC) 5 MG/2ML IV SOLN: 10.0000 mg | Freq: Once | INTRAVENOUS | Status: DC | PRN

## 2020-11-25 MED ORDER — OXYCODONE HCL 5 MG/5ML PO SOLN: 5.0000 mg | Freq: Once | ORAL | Status: DC | PRN

## 2020-11-25 MED ORDER — ONDANSETRON HCL 4 MG/2ML IJ SOLN
INTRAMUSCULAR | Status: DC | PRN
  Administered 2020-11-25: 4 mg via INTRAVENOUS

## 2020-11-25 MED ORDER — PROPOFOL 10 MG/ML IV BOLUS
INTRAVENOUS | Status: AC
  Filled 2020-11-25: qty 40

## 2020-11-25 MED ORDER — MIDAZOLAM HCL 2 MG/2ML IJ SOLN
INTRAMUSCULAR | Status: AC
  Filled 2020-11-25: qty 2

## 2020-11-25 MED ORDER — DEXAMETHASONE SODIUM PHOSPHATE 10 MG/ML IJ SOLN
INTRAMUSCULAR | Status: DC | PRN
  Administered 2020-11-25: 5 mg via INTRAVENOUS

## 2020-11-25 MED ORDER — PROPOFOL 10 MG/ML IV BOLUS
INTRAVENOUS | Status: DC | PRN
  Administered 2020-11-25: 120 mg via INTRAVENOUS

## 2020-11-25 MED ORDER — ACETAMINOPHEN 500 MG PO TABS
1000.0000 mg | ORAL_TABLET | Freq: Once | ORAL | Status: AC
  Administered 2020-11-25: 1000 mg via ORAL

## 2020-11-25 MED ORDER — EPHEDRINE 5 MG/ML INJ
INTRAVENOUS | Status: AC
  Filled 2020-11-25: qty 10

## 2020-11-25 MED ORDER — FENTANYL CITRATE (PF) 100 MCG/2ML IJ SOLN
INTRAMUSCULAR | Status: DC | PRN
  Administered 2020-11-25: 50 ug via INTRAVENOUS
  Administered 2020-11-25 (×2): 25 ug via INTRAVENOUS

## 2020-11-25 MED ORDER — OXYCODONE HCL 5 MG PO TABS
5.0000 mg | ORAL_TABLET | Freq: Once | ORAL | Status: DC | PRN
Start: 2020-11-25 — End: 2020-11-27

## 2020-11-25 SURGICAL SUPPLY — 19 items
BNDG CONFORM 2 STRL LF (GAUZE/BANDAGES/DRESSINGS) IMPLANT
COVER WAND RF STERILE (DRAPES) ×2 IMPLANT
DILATOR CANAL MILEX (MISCELLANEOUS) IMPLANT
DRSG PAD ABDOMINAL 8X10 ST (GAUZE/BANDAGES/DRESSINGS) ×2 IMPLANT
GAUZE 4X4 16PLY RFD (DISPOSABLE) ×2 IMPLANT
GLOVE SURG ENC MOIS LTX SZ7.5 (GLOVE) ×4 IMPLANT
GOWN STRL REUS W/TWL LRG LVL3 (GOWN DISPOSABLE) ×2 IMPLANT
HOLDER FOLEY CATH W/STRAP (MISCELLANEOUS) ×2 IMPLANT
IV NS 1000ML (IV SOLUTION) ×2
IV NS 1000ML BAXH (IV SOLUTION) ×1 IMPLANT
IV SET EXTENSION GRAVITY 40 LF (IV SETS) ×2 IMPLANT
KIT TURNOVER CYSTO (KITS) ×2 IMPLANT
MAT PREVALON FULL STRYKER (MISCELLANEOUS) ×2 IMPLANT
PACK VAGINAL MINOR WOMEN LF (CUSTOM PROCEDURE TRAY) ×2 IMPLANT
PACKING VAGINAL (PACKING) IMPLANT
PAD OB MATERNITY 4.3X12.25 (PERSONAL CARE ITEMS) IMPLANT
TOWEL OR 17X26 10 PK STRL BLUE (TOWEL DISPOSABLE) ×2 IMPLANT
TRAY FOLEY W/BAG SLVR 14FR LF (SET/KITS/TRAYS/PACK) ×2 IMPLANT
WATER STERILE IRR 500ML POUR (IV SOLUTION) ×2 IMPLANT

## 2020-11-25 NOTE — Anesthesia Procedure Notes (Signed)
Procedure Name: LMA Insertion Date/Time: 11/25/2020 8:19 AM Performed by: Gwyndolyn Saxon, CRNA Pre-anesthesia Checklist: Patient identified, Emergency Drugs available, Suction available and Patient being monitored Patient Re-evaluated:Patient Re-evaluated prior to induction Oxygen Delivery Method: Circle system utilized Preoxygenation: Pre-oxygenation with 100% oxygen Induction Type: IV induction Ventilation: Mask ventilation without difficulty LMA: LMA inserted LMA Size: 4.0 Number of attempts: 1 Placement Confirmation: positive ETCO2 and breath sounds checked- equal and bilateral Tube secured with: Tape Dental Injury: Teeth and Oropharynx as per pre-operative assessment

## 2020-11-25 NOTE — Patient Instructions (Signed)
IMMEDIATELY FOLLOWING SURGERY: Do not drive or operate machinery for the first twenty four hours after surgery. Do not make any important decisions for twenty four hours after surgery or while taking narcotic pain medications or sedatives. If you develop intractable nausea and vomiting or a severe headache please notify your doctor immediately.   FOLLOW-UP: You do not need to follow up with anesthesia unless specifically instructed to do so.   WOUND CARE INSTRUCTIONS (if applicable): Expect some mild vaginal bleeding, but if large amount of bleeding occurs please contact Dr. Sondra Come at [phone] or the Radiation On-Call physician. Call for any fever greater than 101.0 degrees or increasing vaginal//abdominal pain or trouble urinating.   QUESTIONS?: Please feel free to call your physician or the hospital operator if you have any questions, and they will be happy to assist you. Resume all medications: as listed on your after visit summary. Your next appointment is:  Future Appointments  Date Time Provider [place]  11/29/2020  7:30 AM WL-US 2 WL-US [place]  11/29/2020 10:00 AM Gery Pray, MD [facility] None  11/29/2020  1:00 PM Gery Pray, MD [facility] None  11/30/2020  8:15 AM [facility] LAB [facility] None  11/30/2020  8:30 AM [facility] [place] FLUSH [facility] None  11/30/2020  9:00 AM Heath Lark, MD [facility] None  11/30/2020 10:00 AM [facility] INFUSION [facility] None  12/08/2020  7:30 AM WL-US 2 WL-US [place]  12/08/2020 10:00 AM Gery Pray, MD [facility] None  12/08/2020 11:00 AM Gery Pray, MD [facility] None  01/26/2021 10:00 AM [facility] LAB [facility] None  01/26/2021 10:15 AM [facility] [place] FLUSH [facility] None  01/26/2021 10:40 AM Heath Lark, MD [facility] None  01/26/2021 11:15 AM [facility] [place] FLUSH [facility] None

## 2020-11-25 NOTE — Op Note (Signed)
11/25/2020  9:13 AM  PATIENT:  Misty Deleon  70 y.o. female  PRE-OPERATIVE DIAGNOSIS:  cervical CA  POST-OPERATIVE DIAGNOSIS:  cervical CA  PROCEDURE:  Procedure(s): TANDEM RING INSERTION (N/A) OPERATIVE ULTRASOUND (N/A)  SURGEON:  Surgeon(s) and Role:    * Gery Pray, MD - Primary  PHYSICIAN ASSISTANT:   ASSISTANTS: none   ANESTHESIA:   general  EBL:  0 mL   BLOOD ADMINISTERED:none  DRAINS: Urinary Catheter (Foley)   LOCAL MEDICATIONS USED:  NONE  SPECIMEN:  No Specimen  DISPOSITION OF SPECIMEN:  N/A  COUNTS:  YES  TOURNIQUET:  * No tourniquets in log *  DICTATION: The patient was transported to the outpatient OR room1. The patient was prepped and draped in the usual sterile fashion and placed in the dorsolithotomy position. Timeout was performed for the procedure. Patient had placement of a Foley catheter without difficulty. The bladder was then backfilled with approximately 250 cc of sterile water for ultrasound imaging purposes. On exam under anesthesia the patient was noted to have a bandlike constriction in the distal vaginal area that would not stretch during general anesthesia. Given this narrow area it was determined that the tandem ring system would not work based on the patient's anatomy due to the difficulty of placing the ring within the upper vaginal cavity. The proximal vagina was also somewhat narrowed to the cervical os and therefore also a ring apparatus would not be able to placed adequately within the target area. On ultrasound imaging the patient was noted to have a anteverted uterus to approximately 30-45 degrees.  The uterus sounded to approximately 7.5 cm.The patient then proceeded to undergo placement of a narrow speculum. The cervical os was visualized and the cervical os was dilated without difficulty.  Some erythema was noted to the mucosa of the proximal vagina but no necrosis noted.ultrasound images were excellent. Again on  ultrasound measurements the cervical region measured approximately 2 cm in maximal dimension. After dilation of the cervical os, the patient then had placement of a 57mm  30 degree tandem within the endometrial cavity and endocervical canal. Good placement was verified on intraoperative ultrasound. Patient then had attached to this 47mm tandem a 2.5 cm diameter cylinder this was in 2 parts and a clamp was placed distally to ensure good placement. Final intraoperative ultrasound imaging showed good placement of the tandem cylinder for high-dose-rate radiation therapy. Patient tolerated the procedure well. She was subsequently transported to the recovery room in stable condition. Later in the day the patient will be taken to radiation oncology for planning and her third high-dose-rate treatment. Patient will receive approximately 5.5 Gy to the high risk clinical target volume.  PLAN OF CARE: Transferred to radiation oncology for planning and treatment  PATIENT DISPOSITION:  PACU - hemodynamically stable.   Delay start of Pharmacological VTE agent (>24hrs) due to surgical blood loss or risk of bleeding: not applicable

## 2020-11-25 NOTE — Transfer of Care (Signed)
Immediate Anesthesia Transfer of Care Note  Patient: Misty Deleon  Procedure(s) Performed: TANDEM RING INSERTION (N/A ) OPERATIVE ULTRASOUND (N/A )  Patient Location: PACU  Anesthesia Type:General  Level of Consciousness: drowsy and patient cooperative  Airway & Oxygen Therapy: Patient Spontanous Breathing and Patient connected to face mask oxygen  Post-op Assessment: Report given to RN and Post -op Vital signs reviewed and stable  Post vital signs: Reviewed and stable  Last Vitals:  Vitals Value Taken Time  BP 107/60 11/25/20 0900  Temp    Pulse 100 11/25/20 0902  Resp 14 11/25/20 0902  SpO2 99 % 11/25/20 0902  Vitals shown include unvalidated device data.  Last Pain:  Vitals:   11/25/20 0623  TempSrc: Oral  PainSc: 0-No pain      Patients Stated Pain Goal: 8 (99/37/16 9678)  Complications: No complications documented.

## 2020-11-25 NOTE — Interval H&P Note (Signed)
History and Physical Interval Note:  11/25/2020 7:42 AM  Misty Deleon  has presented today for surgery, with the diagnosis of cervical CA.  The various methods of treatment have been discussed with the patient and family. After consideration of risks, benefits and other options for treatment, the patient has consented to  Procedure(s): TANDEM RING INSERTION (N/A) OPERATIVE ULTRASOUND (N/A) as a surgical intervention.  The patient's history has been reviewed, patient examined, no change in status, stable for surgery.  I have reviewed the patient's chart and labs.  Questions were answered to the patient's satisfaction.     Gery Pray

## 2020-11-25 NOTE — Progress Notes (Signed)
  Radiation Oncology         ([phone]) [phone] ________________________________  Name: Misty Deleon MRN: [id number]  Date: 11/25/2020  DOB: 09-Jun-1951  CC: Alvester Chou, NP  Alvester Chou, NP  HDR BRACHYTHERAPY NOTE  DIAGNOSIS: Clincal stage IIIC-1 (cT2, cN1, cM0) poorly differentiated cervical cancer  Simple treatment device note: On the operating room the patient had construction of her custom tandem cylinder system. She will be treated with a 30 tandem/cylinder system with a 2.5 cm diameter segmented cylinder. This conforms to her anatomy without undue discomfort.  Vaginal brachytherapy procedure node: The patient was brought to the [place] suite. Identity was confirmed. All relevant records and images related to the planned course of therapy were reviewed. The patient freely provided informed written consent to proceed with treatment after reviewing the details related to the planned course of therapy. The consent form was witnessed and verified by the simulation staff. Then, the patient was set-up in a stable reproducible supine position for radiation therapy. The tandem cylinder system was accessed and fiducial markers were placed within the tandem and cylinder.   Verification simulation note: An AP and lateral film was obtained through the pelvis area. This was compared to the patient's planning films documenting accurate position of the tandem/cylinder system for treatment.  High-dose-rate brachytherapy treatment note:   The remote afterloading device was accessed through catheter system and attached to the tandem cylinder system. Patient then proceeded to undergo her third high-dose-rate treatment directed at the cervix. The patient was prescribed a dose of 5.5 gray to be delivered to the high risk clinical target  volume.  Patient was treated with 1 channels using 16 dwell positions. Treatment time was 421.3 seconds. The patient tolerated the procedure well. After completion of her therapy,  a radiation survey was performed documenting return of the iridium source into the GammaMed safe. The patient was then transferred to the nursing suite.  She then had removal of  the tandem and cylinder system. The patient tolerated the removal well.  PLAN: The patient will return next week for her fourth high-dose-rate treatment directed at the cervical region. ________________________________     Blair Promise, PhD, MD  This document serves as a record of services personally performed by Gery Pray, MD. It was created on his behalf by Clerance Lav, a trained medical scribe. The creation of this record is based on the scribe's personal observations and the provider's statements to them. This document has been checked and approved by the attending provider.

## 2020-11-26 ENCOUNTER — Telehealth: Payer: Self-pay

## 2020-11-26 ENCOUNTER — Other Ambulatory Visit: Payer: Self-pay

## 2020-11-26 ENCOUNTER — Encounter (HOSPITAL_BASED_OUTPATIENT_CLINIC_OR_DEPARTMENT_OTHER): Payer: Self-pay | Admitting: Radiation Oncology

## 2020-11-26 NOTE — Telephone Encounter (Signed)
I am sorry I just got to deal with this now Please proceed to cancel all her appt next week I will schedule follow-up sometime next month

## 2020-11-26 NOTE — Telephone Encounter (Signed)
Called and given below message. Appts canceled and she verbalized understanding.

## 2020-11-26 NOTE — Telephone Encounter (Signed)
Returned call regarding blood work. She is wanting to cancel blood transfusion appt at 10 am on 3/22 because she does not think she will need a blood transfusion. Told her she should will see Dr. Alvy Bimler at 9 am and if no fluids or blood is needed appt will be canceled by Dr. Alvy Bimler. She verbalized understanding.  Just fyi

## 2020-11-26 NOTE — Progress Notes (Signed)
Spoke w/ via phone for pre-op interview--- PT Lab needs dos---- [place]            Lab results------ current EKG in epic/ chart COVID test ------ 11-27-2020 @ 0915 Arrive at ------- 0530 on 11-29-2020 NPO after MN NO Solid Food.  Clear liquids from MN until---  0430 Med rec completed Medications to take morning of surgery ----- NONE Diabetic medication ----- do not take metformin/ januvia morning of surgery  Patient instructed to bring photo id and insurance card day of surgery Patient aware to have Driver (ride ) / caregiver    for 24 hours after surgery -- husband, Misty Deleon Patient Special Instructions ----- n/a  Pre-Op special Istructions ----- Please call IV therapy team for [facility] access for surgery   Patient verbalized understanding of instructions that were given at this phone interview. Patient denies shortness of breath, chest pain, fever, cough at this phone interview.

## 2020-11-27 ENCOUNTER — Other Ambulatory Visit (HOSPITAL_COMMUNITY)
Admission: RE | Admit: 2020-11-27 | Discharge: 2020-11-27 | Disposition: A | Discharge status: Home / Self Care | Payer: Medicare PPO | Source: Ambulatory Visit | Attending: Radiation Oncology | Admitting: Radiation Oncology

## 2020-11-27 DIAGNOSIS — Z01812 Encounter for preprocedural laboratory examination: Secondary | ICD-10-CM | POA: Diagnosis present

## 2020-11-27 DIAGNOSIS — Z20822 Contact with and (suspected) exposure to covid-19: Secondary | ICD-10-CM | POA: Diagnosis not present

## 2020-11-28 LAB — SARS CORONAVIRUS 2 (TAT 6-24 HRS): SARS Coronavirus 2: NEGATIVE

## 2020-11-28 NOTE — Progress Notes (Signed)
  Radiation Oncology         (336) [phone] ________________________________  Name: Misty Deleon MRN: [id number]  Date: 11/29/2020  DOB: 02-24-1951  CC: Alvester Chou, NP  Alvester Chou, NP  HDR BRACHYTHERAPY NOTE  DIAGNOSIS: Clincal stage IIIC-1 (cT2, cN1, cM0) poorly differentiated cervical cancer  Simple treatment device note: On the operating room the patient had construction of her custom tandem cylinder system. She will be treated with a 30 tandem/cylinder system with a 2.5 cm diameter segmented cylinder. This conforms to her anatomy without undue discomfort.  Vaginal brachytherapy procedure node: The patient was brought to the [place] suite. Identity was confirmed. All relevant records and images related to the planned course of therapy were reviewed. The patient freely provided informed written consent to proceed with treatment after reviewing the details related to the planned course of therapy. The consent form was witnessed and verified by the simulation staff. Then, the patient was set-up in a stable reproducible supine position for radiation therapy. The tandem cylinder system was accessed and fiducial markers were placed within the tandem and cylinder.   Verification simulation note: An AP and lateral film was obtained through the pelvis area. This was compared to the patient's planning films documenting accurate position of the tandem/cylinder system for treatment.  High-dose-rate brachytherapy treatment note:   The remote afterloading device was accessed through catheter system and attached to the tandem cylinder system. Patient then proceeded to undergo her fourth high-dose-rate treatment directed at the cervix. The patient was prescribed a dose of 5.5 gray to be delivered to the high risk clinical target  volume.  Patient was treated with 1 channels using 16 dwell positions. Treatment time was 477.9 seconds. The patient tolerated the procedure well. After completion of her therapy,  a radiation survey was performed documenting return of the iridium source into the GammaMed safe. The patient was then transferred to the nursing suite.  She then had removal of  the tandem and cylinder system. The patient tolerated the removal well.  PLAN: The patient will return next week for her fifth and final high-dose-rate treatment directed at the cervical region. ________________________________     Blair Promise, PhD, MD  This document serves as a record of services personally performed by Gery Pray, MD. It was created on his behalf by Clerance Lav, a trained medical scribe. The creation of this record is based on the scribe's personal observations and the provider's statements to them. This document has been checked and approved by the attending provider.

## 2020-11-28 NOTE — Anesthesia Preprocedure Evaluation (Addendum)
Anesthesia Evaluation  Patient identified by MRN, date of birth, ID band Patient awake    Reviewed: Allergy & Precautions, NPO status , Patient's Chart, lab work & pertinent test results  Airway Mallampati: II  TM Distance: <3 FB Neck ROM: Full    Dental no notable dental hx.    Pulmonary neg pulmonary ROS,    Pulmonary exam normal breath sounds clear to auscultation       Cardiovascular hypertension, Pt. on medications Normal cardiovascular exam Rhythm:Regular Rate:Normal     Neuro/Psych negative neurological ROS     GI/Hepatic Neg liver ROS, GERD  ,  Endo/Other  diabetes, Well Controlled, Type 2, Oral Hypoglycemic AgentsObesity BMI 36  Renal/GU Renal disease     Musculoskeletal negative musculoskeletal ROS (+)   Abdominal   Peds negative pediatric ROS (+)  Hematology negative hematology ROS (+) anemia ,   Anesthesia Other Findings   Reproductive/Obstetrics negative OB ROS                             Anesthesia Physical  Anesthesia Plan  ASA: III  Anesthesia Plan: General   Post-op Pain Management:    Induction: Intravenous  PONV Risk Score and Plan: 4 or greater and Dexamethasone, Ondansetron, Midazolam and Treatment may vary due to age or medical condition  Airway Management Planned: LMA  Additional Equipment: None  Intra-op Plan:   Post-operative Plan: Extubation in OR  Informed Consent: I have reviewed the patients History and Physical, chart, labs and discussed the procedure including the risks, benefits and alternatives for the proposed anesthesia with the patient or authorized representative who has indicated his/her understanding and acceptance.     Dental advisory given  Plan Discussed with: CRNA, Anesthesiologist and Surgeon  Anesthesia Plan Comments: (#3 LMA    )        Anesthesia Quick Evaluation

## 2020-11-29 ENCOUNTER — Ambulatory Visit (HOSPITAL_BASED_OUTPATIENT_CLINIC_OR_DEPARTMENT_OTHER)
Admission: RE | Admit: 2020-11-29 | Discharge: 2020-11-29 | Disposition: A | Discharge status: Home / Self Care | Payer: Medicare PPO | Attending: Radiation Oncology | Admitting: Radiation Oncology

## 2020-11-29 ENCOUNTER — Ambulatory Visit (HOSPITAL_BASED_OUTPATIENT_CLINIC_OR_DEPARTMENT_OTHER): Payer: Medicare PPO | Admitting: Anesthesiology

## 2020-11-29 ENCOUNTER — Encounter (HOSPITAL_BASED_OUTPATIENT_CLINIC_OR_DEPARTMENT_OTHER): Payer: Self-pay | Admitting: Radiation Oncology

## 2020-11-29 ENCOUNTER — Ambulatory Visit (HOSPITAL_COMMUNITY)
Admission: RE | Admit: 2020-11-29 | Discharge: 2020-11-29 | Disposition: A | Discharge status: Home / Self Care | Payer: Medicare PPO | Source: Ambulatory Visit | Attending: Radiation Oncology | Admitting: Radiation Oncology

## 2020-11-29 ENCOUNTER — Ambulatory Visit
Admission: RE | Admit: 2020-11-29 | Discharge: 2020-11-29 | Disposition: A | Discharge status: Home / Self Care | Payer: Medicare PPO | Source: Ambulatory Visit | Attending: Radiation Oncology | Admitting: Radiation Oncology

## 2020-11-29 ENCOUNTER — Encounter (HOSPITAL_BASED_OUTPATIENT_CLINIC_OR_DEPARTMENT_OTHER)
Admission: RE | Disposition: A | Discharge status: Home / Self Care | Payer: Self-pay | Source: Home / Self Care | Attending: Radiation Oncology

## 2020-11-29 ENCOUNTER — Other Ambulatory Visit: Payer: Self-pay

## 2020-11-29 VITALS — BP 115/60 | HR 87 | Temp 98.0°F | Resp 20

## 2020-11-29 DIAGNOSIS — C539 Malignant neoplasm of cervix uteri, unspecified: Secondary | ICD-10-CM | POA: Insufficient documentation

## 2020-11-29 DIAGNOSIS — Z95828 Presence of other vascular implants and grafts: Secondary | ICD-10-CM | POA: Diagnosis not present

## 2020-11-29 DIAGNOSIS — Z9221 Personal history of antineoplastic chemotherapy: Secondary | ICD-10-CM | POA: Diagnosis not present

## 2020-11-29 DIAGNOSIS — D539 Nutritional anemia, unspecified: Secondary | ICD-10-CM

## 2020-11-29 DIAGNOSIS — Z79899 Other long term (current) drug therapy: Secondary | ICD-10-CM | POA: Diagnosis not present

## 2020-11-29 DIAGNOSIS — Z923 Personal history of irradiation: Secondary | ICD-10-CM | POA: Insufficient documentation

## 2020-11-29 HISTORY — PX: OPERATIVE ULTRASOUND: SHX5996

## 2020-11-29 HISTORY — PX: TANDEM RING INSERTION: SHX6199

## 2020-11-29 LAB — GLUCOSE, CAPILLARY: Glucose-Capillary: 152 mg/dL — ABNORMAL HIGH (ref 70–99)

## 2020-11-29 LAB — POCT I-STAT, CHEM 8
BUN: 10 mg/dL (ref 8–23)
Calcium, Ion: 1.27 mmol/L (ref 1.15–1.40)
Chloride: 99 mmol/L (ref 98–111)
Creatinine, Ser: 1.1 mg/dL — ABNORMAL HIGH (ref 0.44–1.00)
Glucose, Bld: 188 mg/dL — ABNORMAL HIGH (ref 70–99)
HCT: 28 % — ABNORMAL LOW (ref 36.0–46.0)
Hemoglobin: 9.5 g/dL — ABNORMAL LOW (ref 12.0–15.0)
Potassium: 3.4 mmol/L — ABNORMAL LOW (ref 3.5–5.1)
Sodium: 138 mmol/L (ref 135–145)
TCO2: 27 mmol/L (ref 22–32)

## 2020-11-29 SURGERY — INSERTION, UTERINE TANDEM AND RING OR CYLINDER, FOR BRACHYTHERAPY
Anesthesia: General | Site: Vagina

## 2020-11-29 MED ORDER — OXYCODONE HCL 5 MG PO TABS
5.0000 mg | ORAL_TABLET | Freq: Once | ORAL | Status: DC | PRN
Start: 2020-11-29 — End: 2020-12-01

## 2020-11-29 MED ORDER — HEPARIN SOD (PORK) LOCK FLUSH 100 UNIT/ML IV SOLN
500.0000 [IU] | Freq: Once | INTRAVENOUS | Status: AC
  Administered 2020-11-29: 500 [IU] via INTRAVENOUS

## 2020-11-29 MED ORDER — SODIUM CHLORIDE 0.9 % IV SOLN: INTRAVENOUS | Status: DC

## 2020-11-29 MED ORDER — ONDANSETRON HCL 4 MG/2ML IJ SOLN: 4.0000 mg | Freq: Once | INTRAMUSCULAR | Status: DC | PRN

## 2020-11-29 MED ORDER — AMISULPRIDE (ANTIEMETIC) 5 MG/2ML IV SOLN: 10.0000 mg | Freq: Once | INTRAVENOUS | Status: DC | PRN

## 2020-11-29 MED ORDER — ACETAMINOPHEN 500 MG PO TABS
ORAL_TABLET | ORAL | Status: AC
  Filled 2020-11-29: qty 2

## 2020-11-29 MED ORDER — MIDAZOLAM HCL 2 MG/2ML IJ SOLN
INTRAMUSCULAR | Status: DC | PRN
  Administered 2020-11-29: 2 mg via INTRAVENOUS

## 2020-11-29 MED ORDER — HEPARIN SOD (PORK) LOCK FLUSH 100 UNIT/ML IV SOLN
500.0000 [IU] | Freq: Every day | INTRAVENOUS | Status: DC | PRN
Start: 2020-11-29 — End: 2020-12-01

## 2020-11-29 MED ORDER — OXYCODONE HCL 5 MG/5ML PO SOLN
5.0000 mg | Freq: Once | ORAL | Status: DC | PRN
Start: 2020-11-29 — End: 2020-12-01

## 2020-11-29 MED ORDER — SODIUM CHLORIDE 0.9 % IR SOLN
Status: DC | PRN
  Administered 2020-11-29: 250 mL

## 2020-11-29 MED ORDER — SODIUM CHLORIDE 0.9% FLUSH: 10.0000 mL | INTRAVENOUS | Status: DC | PRN

## 2020-11-29 MED ORDER — PHENYLEPHRINE 40 MCG/ML (10ML) SYRINGE FOR IV PUSH (FOR BLOOD PRESSURE SUPPORT)
PREFILLED_SYRINGE | INTRAVENOUS | Status: DC | PRN
  Administered 2020-11-29: 120 ug via INTRAVENOUS
  Administered 2020-11-29: 80 ug via INTRAVENOUS
  Administered 2020-11-29: 120 ug via INTRAVENOUS

## 2020-11-29 MED ORDER — ONDANSETRON HCL 4 MG/2ML IJ SOLN
INTRAMUSCULAR | Status: AC
  Filled 2020-11-29: qty 2

## 2020-11-29 MED ORDER — LIDOCAINE 2% (20 MG/ML) 5 ML SYRINGE
INTRAMUSCULAR | Status: DC | PRN
  Administered 2020-11-29: 60 mg via INTRAVENOUS

## 2020-11-29 MED ORDER — FENTANYL CITRATE (PF) 100 MCG/2ML IJ SOLN
INTRAMUSCULAR | Status: DC | PRN
  Administered 2020-11-29: 50 ug via INTRAVENOUS
  Administered 2020-11-29 (×2): 25 ug via INTRAVENOUS

## 2020-11-29 MED ORDER — DEXAMETHASONE SODIUM PHOSPHATE 10 MG/ML IJ SOLN
INTRAMUSCULAR | Status: DC | PRN
  Administered 2020-11-29: 5 mg via INTRAVENOUS

## 2020-11-29 MED ORDER — SODIUM CHLORIDE 0.9% FLUSH
10.0000 mL | INTRAVENOUS | Status: DC | PRN
  Administered 2020-11-29: 10 mL via INTRAVENOUS

## 2020-11-29 MED ORDER — POVIDONE-IODINE 10 % EX SWAB: 2.0000 "application " | Freq: Once | CUTANEOUS | Status: DC

## 2020-11-29 MED ORDER — FENTANYL CITRATE (PF) 100 MCG/2ML IJ SOLN: 25.0000 ug | INTRAMUSCULAR | Status: DC | PRN

## 2020-11-29 MED ORDER — ONDANSETRON HCL 4 MG/2ML IJ SOLN
INTRAMUSCULAR | Status: DC | PRN
  Administered 2020-11-29: 4 mg via INTRAVENOUS

## 2020-11-29 MED ORDER — MIDAZOLAM HCL 2 MG/2ML IJ SOLN
INTRAMUSCULAR | Status: AC
  Filled 2020-11-29: qty 2

## 2020-11-29 MED ORDER — PROPOFOL 10 MG/ML IV BOLUS
INTRAVENOUS | Status: AC
  Filled 2020-11-29: qty 20

## 2020-11-29 MED ORDER — EPHEDRINE SULFATE-NACL 50-0.9 MG/10ML-% IV SOSY
PREFILLED_SYRINGE | INTRAVENOUS | Status: DC | PRN
  Administered 2020-11-29 (×2): 10 mg via INTRAVENOUS

## 2020-11-29 MED ORDER — FENTANYL CITRATE (PF) 100 MCG/2ML IJ SOLN
INTRAMUSCULAR | Status: AC
  Filled 2020-11-29: qty 2

## 2020-11-29 MED ORDER — DEXAMETHASONE SODIUM PHOSPHATE 10 MG/ML IJ SOLN
INTRAMUSCULAR | Status: AC
  Filled 2020-11-29: qty 1

## 2020-11-29 MED ORDER — LIDOCAINE 2% (20 MG/ML) 5 ML SYRINGE
INTRAMUSCULAR | Status: AC
  Filled 2020-11-29: qty 5

## 2020-11-29 MED ORDER — ACETAMINOPHEN 500 MG PO TABS
1000.0000 mg | ORAL_TABLET | Freq: Once | ORAL | Status: AC
  Administered 2020-11-29: 1000 mg via ORAL

## 2020-11-29 MED ORDER — HEPARIN SOD (PORK) LOCK FLUSH 100 UNIT/ML IV SOLN: 250.0000 [IU] | INTRAVENOUS | Status: DC | PRN

## 2020-11-29 MED ORDER — PROPOFOL 10 MG/ML IV BOLUS
INTRAVENOUS | Status: DC | PRN
  Administered 2020-11-29: 130 mg via INTRAVENOUS

## 2020-11-29 SURGICAL SUPPLY — 19 items
COVER WAND RF STERILE (DRAPES) ×3 IMPLANT
DRSG PAD ABDOMINAL 8X10 ST (GAUZE/BANDAGES/DRESSINGS) ×3 IMPLANT
GLOVE SURG ENC MOIS LTX SZ6.5 (GLOVE) ×6 IMPLANT
GLOVE SURG ENC MOIS LTX SZ7.5 (GLOVE) ×6 IMPLANT
GLOVE SURG UNDER POLY LF SZ7 (GLOVE) ×9 IMPLANT
GOWN STRL REUS W/TWL LRG LVL3 (GOWN DISPOSABLE) ×9 IMPLANT
HOLDER FOLEY CATH W/STRAP (MISCELLANEOUS) ×3 IMPLANT
IV NS 1000ML (IV SOLUTION) ×3
IV NS 1000ML BAXH (IV SOLUTION) ×2 IMPLANT
IV SET EXTENSION GRAVITY 40 LF (IV SETS) ×3 IMPLANT
KIT TURNOVER CYSTO (KITS) ×3 IMPLANT
Light Handle Cover ×3 IMPLANT
MAT PREVALON FULL STRYKER (MISCELLANEOUS) ×3 IMPLANT
NS IRRIG 500ML POUR BTL (IV SOLUTION) ×3 IMPLANT
PACK VAGINAL MINOR WOMEN LF (CUSTOM PROCEDURE TRAY) ×3 IMPLANT
SCOPETTES 8  STERILE (MISCELLANEOUS) ×3
SCOPETTES 8 STERILE (MISCELLANEOUS) ×2 IMPLANT
TOWEL OR 17X26 10 PK STRL BLUE (TOWEL DISPOSABLE) ×3 IMPLANT
TRAY FOLEY W/BAG SLVR 14FR LF (SET/KITS/TRAYS/PACK) ×3 IMPLANT

## 2020-11-29 NOTE — Interval H&P Note (Signed)
History and Physical Interval Note:  11/29/2020 7:30 AM  Misty Deleon  has presented today for surgery, with the diagnosis of cervical CA.  The various methods of treatment have been discussed with the patient and family. After consideration of risks, benefits and other options for treatment, the patient has consented to  Procedure(s): TANDEM RING INSERTION (N/A) OPERATIVE ULTRASOUND (N/A) as a surgical intervention.  The patient's history has been reviewed, patient examined, no change in status, stable for surgery.  I have reviewed the patient's chart and labs.  Questions were answered to the patient's satisfaction.     Gery Pray

## 2020-11-29 NOTE — Anesthesia Postprocedure Evaluation (Signed)
Anesthesia Post Note  Patient: Misty Deleon  Procedure(s) Performed: TANDEM RING INSERTION (N/A ) OPERATIVE ULTRASOUND (N/A )     Patient location during evaluation: PACU Anesthesia Type: General Level of consciousness: awake and alert Pain management: pain level controlled Vital Signs Assessment: post-procedure vital signs reviewed and stable Respiratory status: spontaneous breathing, nonlabored ventilation and respiratory function stable Cardiovascular status: blood pressure returned to baseline and stable Postop Assessment: no apparent nausea or vomiting Anesthetic complications: no   No complications documented.  Last Vitals:  Vitals:   11/25/20 1030 11/25/20 1045  BP: 121/64   Pulse: 92 91  Resp: 15 18  Temp:    SpO2: 95% 96%    Last Pain:  Vitals:   11/25/20 1030  TempSrc:   PainSc: 0-No pain                 Merlinda Frederick

## 2020-11-29 NOTE — Op Note (Signed)
11/29/2020  8:42 AM  PATIENT:  Misty Deleon  70 y.o. female  PRE-OPERATIVE DIAGNOSIS:  cervical CA  POST-OPERATIVE DIAGNOSIS:  cervical CA  PROCEDURE:  Procedure(s): TANDEM RING INSERTION (N/A) OPERATIVE ULTRASOUND (N/A)  SURGEON:  Surgeon(s) and Role:    * Gery Pray, MD - Primary  PHYSICIAN ASSISTANT:   ASSISTANTS: none   ANESTHESIA:   general  EBL:  5 mL   BLOOD ADMINISTERED:none  DRAINS: Urinary Catheter (Foley)   LOCAL MEDICATIONS USED:  NONE  SPECIMEN:  No Specimen  DISPOSITION OF SPECIMEN:  N/A  COUNTS:  YES  TOURNIQUET:  * No tourniquets in log *  DICTATION:  The patient was transported to the outpatient OR room4. The patient was prepped and draped in the usual sterile fashion and placed in the dorsolithotomy position. Timeout was performed for the procedure. Patient had placement of a Foley catheter without difficulty. The bladder was then backfilled with approximately 250 cc of sterile saline for ultrasound imaging purposes. On exam under anesthesia the patient was noted to have a bandlike constriction in the distal vaginal area that would not stretch during general anesthesia. Given this narrow area it was determined that the tandem ring system would not work based on the patient's anatomy due to the difficulty of placing the ring within the upper vaginal cavity. The proximal vagina was also somewhat narrowed to the cervical os and therefore also a ring apparatus would not be able to placed adequately within the target area. On ultrasound imaging the patient was noted to have a anteverted uterus to approximately 30-45 degrees.The uterus sounded to approximately 7.5 cm.The patient then proceeded to undergo placement of a narrow speculum. The cervical os was visualized and the cervical os was dilated without difficulty.  Some erythema was noted to the mucosa of the proximal vagina but no necrosis noted.ultrasound images were excellent. Again on  ultrasound measurements the cervical region measured approximately 2 cm in maximal dimension. After dilation of the cervical os, the patient then had placement of a 54mm  30 degree tandem within the endometrial cavity and endocervical canal. Good placement was verified on intraoperative ultrasound. Patient then had attached to this  tandem a 2.5 cm diameter cylinder followed by 2.0 cm cylinder this was in 2 parts and a clamp was placed distally to ensure good placement. Final intraoperative ultrasound imaging showed good placement of the tandem cylinder for high-dose-rate radiation therapy. Patient tolerated the procedure well. She was subsequently transported to the recovery room in stable condition. Later in the day the patient will be taken to radiation oncology for planning and herfourth high-dose-rate treatment. Patient will receive approximately 5.5 Gy to the high risk clinical target volume.  PLAN OF CARE: Transferred to radiation oncology for planning and treatment  PATIENT DISPOSITION:  PACU - hemodynamically stable.   Delay start of Pharmacological VTE agent (>24hrs) due to surgical blood loss or risk of bleeding: not applicable

## 2020-11-29 NOTE — Anesthesia Postprocedure Evaluation (Signed)
Anesthesia Post Note  Patient: Misty Deleon  Procedure(s) Performed: TANDEM RING INSERTION (N/A Vagina ) OPERATIVE ULTRASOUND (N/A Abdomen)     Patient location during evaluation: PACU Anesthesia Type: General Level of consciousness: awake and alert Pain management: pain level controlled Vital Signs Assessment: post-procedure vital signs reviewed and stable Respiratory status: spontaneous breathing, nonlabored ventilation and respiratory function stable Cardiovascular status: blood pressure returned to baseline and stable Postop Assessment: no apparent nausea or vomiting Anesthetic complications: no   No complications documented.  Last Vitals:  Vitals:   11/29/20 0900 11/29/20 0915  BP: 123/67 125/68  Pulse: 84 93  Resp: 12 19  Temp:    SpO2: 96% 97%    Last Pain:  Vitals:   11/29/20 0915  TempSrc:   PainSc: 0-No pain                 Merlinda Frederick

## 2020-11-29 NOTE — Transfer of Care (Signed)
Immediate Anesthesia Transfer of Care Note  Patient: TOMIA ENLOW  Procedure(s) Performed: Procedure(s) (LRB): TANDEM RING INSERTION (N/A) OPERATIVE ULTRASOUND (N/A)  Patient Location: PACU  Anesthesia Type: General  Level of Consciousness: awake, alert  and oriented  Airway & Oxygen Therapy: Patient Spontanous Breathing and connected to nasal cannula oxygen.  Post-op Assessment: Report given to PACU RN and Post -op Vital signs reviewed and stable  Post vital signs: Reviewed and stable  Complications: No apparent anesthesia complications Last Vitals:  Vitals Value Taken Time  BP 112/62 11/29/20 0822  Temp    Pulse 96 11/29/20 0826  Resp 14 11/29/20 0826  SpO2 100 % 11/29/20 0826  Vitals shown include unvalidated device data.  Last Pain:  Vitals:   11/29/20 0635  TempSrc: Oral  PainSc: 0-No pain      Patients Stated Pain Goal: 3 (33/82/50 5397)  Complications: No complications documented.

## 2020-11-29 NOTE — Patient Instructions (Addendum)
IMMEDIATELY FOLLOWING SURGERY: Do not drive or operate machinery for the first twenty four hours after surgery. Do not make any important decisions for twenty four hours after surgery or while taking narcotic pain medications or sedatives. If you develop intractable nausea and vomiting or a severe headache please notify your doctor immediately.   FOLLOW-UP: You do not need to follow up with anesthesia unless specifically instructed to do so.   WOUND CARE INSTRUCTIONS (if applicable): Expect some mild vaginal bleeding, but if large amount of bleeding occurs please contact Dr. Sondra Come at [phone] or the Radiation On-Call physician. Call for any fever greater than 101.0 degrees or increasing vaginal//abdominal pain or trouble urinating.   QUESTIONS?: Please feel free to call your physician or the hospital operator if you have any questions, and they will be happy to assist you. Resume all medications: as listed on your after visit summary. Your next appointment is:  Future Appointments  Date Time Provider [place]  12/08/2020  7:30 AM WL-US 2 WL-US [place]  12/08/2020 10:00 AM Gery Pray, MD [facility] None  12/08/2020 11:00 AM Gery Pray, MD [facility] None  01/26/2021 10:00 AM [facility] LAB [facility] None  01/26/2021 10:15 AM [facility] [place] FLUSH [facility] None  01/26/2021 10:40 AM Heath Lark, MD [facility] None  01/26/2021 11:15 AM [facility] [place] FLUSH [facility] None

## 2020-11-29 NOTE — Anesthesia Procedure Notes (Signed)
Procedure Name: LMA Insertion Date/Time: 11/29/2020 7:43 AM Performed by: Mechele Claude, CRNA Pre-anesthesia Checklist: Patient identified, Emergency Drugs available, Suction available and Patient being monitored Patient Re-evaluated:Patient Re-evaluated prior to induction Oxygen Delivery Method: Circle system utilized Preoxygenation: Pre-oxygenation with 100% oxygen Induction Type: IV induction Ventilation: Mask ventilation without difficulty LMA: LMA inserted LMA Size: 3.0 Number of attempts: 1 Airway Equipment and Method: Bite block Placement Confirmation: positive ETCO2 Tube secured with: Tape Dental Injury: Teeth and Oropharynx as per pre-operative assessment

## 2020-11-29 NOTE — Progress Notes (Signed)
IMMEDIATELY FOLLOWING SURGERY: Do not drive or operate machinery for the first twenty four hours after surgery. Do not make any important decisions for twenty four hours after surgery or while taking narcotic pain medications or sedatives. If you develop intractable nausea and vomiting or a severe headache please notify your doctor immediately.   FOLLOW-UP: You do not need to follow up with anesthesia unless specifically instructed to do so.   WOUND CARE INSTRUCTIONS (if applicable): Expect some mild vaginal bleeding, but if large amount of bleeding occurs please contact Dr. Sondra Come at [phone] or the Radiation On-Call physician. Call for any fever greater than 101.0 degrees or increasing vaginal//abdominal pain or trouble urinating.   QUESTIONS?: Please feel free to call your physician or the hospital operator if you have any questions, and they will be happy to assist you. Resume all medications: as listed on your after visit summary. Your next appointment is:  Future Appointments  Date Time Provider [place]  12/08/2020  7:30 AM [facility] 2 [facility]US [place]  12/08/2020 10:00 AM Gery Pray, MD [facility] None  12/08/2020 11:00 AM Gery Pray, MD [facility] None  01/26/2021 10:00 AM [facility] None  01/26/2021 10:15 AM [facility] [place] [facility] None  01/26/2021 10:40 AM Heath Lark, MD [facility] None  01/26/2021 11:15 AM [facility] [place] [facility] None

## 2020-11-30 ENCOUNTER — Encounter (HOSPITAL_BASED_OUTPATIENT_CLINIC_OR_DEPARTMENT_OTHER): Payer: Self-pay | Admitting: Radiation Oncology

## 2020-11-30 ENCOUNTER — Inpatient Hospital Stay: Payer: Medicare PPO

## 2020-11-30 ENCOUNTER — Inpatient Hospital Stay: Payer: Medicare PPO | Admitting: Hematology and Oncology

## 2020-12-03 ENCOUNTER — Encounter (HOSPITAL_BASED_OUTPATIENT_CLINIC_OR_DEPARTMENT_OTHER): Payer: Self-pay | Admitting: Radiation Oncology

## 2020-12-03 ENCOUNTER — Other Ambulatory Visit: Payer: Self-pay

## 2020-12-03 NOTE — Progress Notes (Signed)
Spoke w/ via phone for pre-op interview--- PT Lab needs dos---- Istat            Lab results------ current EKG in epic/ chart COVID test ------ 12-07-2020 @ 1205 Arrive at ------- 0530 on 12-08-2020 NPO after MN NO Solid Food.  Clear liquids from MN until---  0430 Med rec completed Medications to take morning of surgery ----- NONE Diabetic medication ----- do not take metformin/ januvia morning of surgery  Patient instructed to bring photo id and insurance card day of surgery Patient aware to have Driver (ride ) / caregiver    for 24 hours after surgery -- husband, Misty Deleon Patient Special Instructions ----- n/a  Pre-Op special Istructions ----- Please call IV therapy team for [facility] access for surgery   Patient verbalized understanding of instructions that were given at this phone interview. Patient denies shortness of breath, chest pain, fever, cough at this phone interview.

## 2020-12-07 ENCOUNTER — Other Ambulatory Visit (HOSPITAL_COMMUNITY)
Admission: RE | Admit: 2020-12-07 | Discharge: 2020-12-07 | Disposition: A | Discharge status: Home / Self Care | Payer: Medicare PPO | Source: Ambulatory Visit | Attending: Radiation Oncology | Admitting: Radiation Oncology

## 2020-12-07 DIAGNOSIS — Z01812 Encounter for preprocedural laboratory examination: Secondary | ICD-10-CM | POA: Diagnosis present

## 2020-12-07 DIAGNOSIS — Z20822 Contact with and (suspected) exposure to covid-19: Secondary | ICD-10-CM | POA: Diagnosis not present

## 2020-12-07 LAB — SARS CORONAVIRUS 2 (TAT 6-24 HRS): SARS Coronavirus 2: NEGATIVE

## 2020-12-07 NOTE — Anesthesia Preprocedure Evaluation (Addendum)
Anesthesia Evaluation  Patient identified by MRN, date of birth, ID band Patient awake    Reviewed: Allergy & Precautions, NPO status , Patient's Chart, lab work & pertinent test results  History of Anesthesia Complications Negative for: history of anesthetic complications  Airway Mallampati: III  TM Distance: >3 FB Neck ROM: Full  Mouth opening: Limited Mouth Opening  Dental  (+) Dental Advisory Given   Pulmonary neg pulmonary ROS,  11/27/2020 SARS coronavirus NEG   breath sounds clear to auscultation       Cardiovascular hypertension, Pt. on medications (-) angina Rhythm:Regular Rate:Normal     Neuro/Psych negative neurological ROS     GI/Hepatic Neg liver ROS, GERD  Controlled,  Endo/Other  diabetes (glu 171), Oral Hypoglycemic AgentsMorbid obesityPCOS  Renal/GU Renal InsufficiencyRenal disease   Cervical cancer: chemo, XRT    Musculoskeletal   Abdominal (+) + obese,   Peds  Hematology  (+) Blood dyscrasia (Hb 9.5), anemia ,   Anesthesia Other Findings   Reproductive/Obstetrics                           Anesthesia Physical Anesthesia Plan  ASA: III  Anesthesia Plan: General   Post-op Pain Management:    Induction: Intravenous  PONV Risk Score and Plan: 3 and Ondansetron, Dexamethasone and Treatment may vary due to age or medical condition  Airway Management Planned: LMA  Additional Equipment: None  Intra-op Plan:   Post-operative Plan:   Informed Consent: I have reviewed the patients History and Physical, chart, labs and discussed the procedure including the risks, benefits and alternatives for the proposed anesthesia with the patient or authorized representative who has indicated his/her understanding and acceptance.     Dental advisory given  Plan Discussed with: CRNA and Surgeon  Anesthesia Plan Comments:        Anesthesia Quick Evaluation

## 2020-12-08 ENCOUNTER — Encounter: Payer: Self-pay | Admitting: Radiation Oncology

## 2020-12-08 ENCOUNTER — Ambulatory Visit (HOSPITAL_BASED_OUTPATIENT_CLINIC_OR_DEPARTMENT_OTHER): Payer: Medicare PPO | Admitting: Anesthesiology

## 2020-12-08 ENCOUNTER — Ambulatory Visit
Admission: RE | Admit: 2020-12-08 | Discharge: 2020-12-08 | Disposition: A | Discharge status: Home / Self Care | Payer: Medicare PPO | Source: Ambulatory Visit | Attending: Radiation Oncology | Admitting: Radiation Oncology

## 2020-12-08 ENCOUNTER — Encounter (HOSPITAL_BASED_OUTPATIENT_CLINIC_OR_DEPARTMENT_OTHER): Payer: Self-pay | Admitting: Radiation Oncology

## 2020-12-08 ENCOUNTER — Ambulatory Visit (HOSPITAL_BASED_OUTPATIENT_CLINIC_OR_DEPARTMENT_OTHER)
Admission: RE | Admit: 2020-12-08 | Discharge: 2020-12-08 | Disposition: A | Discharge status: Other Acute Inpatient Hospital | Payer: Medicare PPO | Attending: Radiation Oncology | Admitting: Radiation Oncology

## 2020-12-08 ENCOUNTER — Encounter (HOSPITAL_BASED_OUTPATIENT_CLINIC_OR_DEPARTMENT_OTHER)
Admission: RE | Disposition: A | Discharge status: Other Acute Inpatient Hospital | Payer: Self-pay | Source: Home / Self Care | Attending: Radiation Oncology

## 2020-12-08 ENCOUNTER — Ambulatory Visit (HOSPITAL_COMMUNITY)
Admission: RE | Admit: 2020-12-08 | Discharge: 2020-12-08 | Disposition: A | Discharge status: Home / Self Care | Payer: Medicare PPO | Source: Ambulatory Visit | Attending: Radiation Oncology | Admitting: Radiation Oncology

## 2020-12-08 VITALS — BP 121/65 | HR 79

## 2020-12-08 VITALS — BP 138/64 | HR 104 | Temp 98.4°F | Resp 20

## 2020-12-08 DIAGNOSIS — Z95828 Presence of other vascular implants and grafts: Secondary | ICD-10-CM | POA: Diagnosis not present

## 2020-12-08 DIAGNOSIS — C539 Malignant neoplasm of cervix uteri, unspecified: Secondary | ICD-10-CM

## 2020-12-08 HISTORY — PX: TANDEM RING INSERTION: SHX6199

## 2020-12-08 HISTORY — PX: OPERATIVE ULTRASOUND: SHX5996

## 2020-12-08 LAB — POCT I-STAT, CHEM 8
BUN: 13 mg/dL (ref 8–23)
Calcium, Ion: 1.26 mmol/L (ref 1.15–1.40)
Chloride: 99 mmol/L (ref 98–111)
Creatinine, Ser: 1.1 mg/dL — ABNORMAL HIGH (ref 0.44–1.00)
Glucose, Bld: 171 mg/dL — ABNORMAL HIGH (ref 70–99)
HCT: 28 % — ABNORMAL LOW (ref 36.0–46.0)
Hemoglobin: 9.5 g/dL — ABNORMAL LOW (ref 12.0–15.0)
Potassium: 3.3 mmol/L — ABNORMAL LOW (ref 3.5–5.1)
Sodium: 141 mmol/L (ref 135–145)
TCO2: 24 mmol/L (ref 22–32)

## 2020-12-08 LAB — GLUCOSE, CAPILLARY: Glucose-Capillary: 143 mg/dL — ABNORMAL HIGH (ref 70–99)

## 2020-12-08 SURGERY — INSERTION, UTERINE TANDEM AND RING OR CYLINDER, FOR BRACHYTHERAPY
Anesthesia: General | Site: Pelvis

## 2020-12-08 MED ORDER — PROPOFOL 500 MG/50ML IV EMUL
INTRAVENOUS | Status: AC
  Filled 2020-12-08: qty 50

## 2020-12-08 MED ORDER — MIDAZOLAM HCL 2 MG/2ML IJ SOLN
INTRAMUSCULAR | Status: AC
  Filled 2020-12-08: qty 2

## 2020-12-08 MED ORDER — PROPOFOL 10 MG/ML IV BOLUS
INTRAVENOUS | Status: DC | PRN
  Administered 2020-12-08: 200 mg via INTRAVENOUS

## 2020-12-08 MED ORDER — DEXAMETHASONE SODIUM PHOSPHATE 10 MG/ML IJ SOLN
INTRAMUSCULAR | Status: DC | PRN
  Administered 2020-12-08: 10 mg via INTRAVENOUS

## 2020-12-08 MED ORDER — PHENYLEPHRINE 40 MCG/ML (10ML) SYRINGE FOR IV PUSH (FOR BLOOD PRESSURE SUPPORT)
PREFILLED_SYRINGE | INTRAVENOUS | Status: AC
  Filled 2020-12-08: qty 10

## 2020-12-08 MED ORDER — SODIUM CHLORIDE 0.9 % IV SOLN: INTRAVENOUS | Status: DC

## 2020-12-08 MED ORDER — ONDANSETRON HCL 4 MG/2ML IJ SOLN
INTRAMUSCULAR | Status: DC | PRN
  Administered 2020-12-08: 4 mg via INTRAVENOUS

## 2020-12-08 MED ORDER — MIDAZOLAM HCL 5 MG/5ML IJ SOLN
INTRAMUSCULAR | Status: DC | PRN
  Administered 2020-12-08: 2 mg via INTRAVENOUS

## 2020-12-08 MED ORDER — DEXAMETHASONE SODIUM PHOSPHATE 10 MG/ML IJ SOLN
INTRAMUSCULAR | Status: AC
  Filled 2020-12-08: qty 1

## 2020-12-08 MED ORDER — SODIUM CHLORIDE 0.9% FLUSH
10.0000 mL | Freq: Once | INTRAVENOUS | Status: AC
  Administered 2020-12-08: 10 mL via INTRAVENOUS

## 2020-12-08 MED ORDER — SODIUM CHLORIDE 0.9 % IR SOLN
Status: DC | PRN
  Administered 2020-12-08: 200 mL via INTRAVESICAL

## 2020-12-08 MED ORDER — LIDOCAINE 2% (20 MG/ML) 5 ML SYRINGE
INTRAMUSCULAR | Status: AC
  Filled 2020-12-08: qty 5

## 2020-12-08 MED ORDER — LACTATED RINGERS IV SOLN
INTRAVENOUS | Status: DC
  Filled 2020-12-08 (×2): qty 250

## 2020-12-08 MED ORDER — CHLORHEXIDINE GLUCONATE CLOTH 2 % EX PADS: 6.0000 | MEDICATED_PAD | Freq: Every day | CUTANEOUS | Status: DC

## 2020-12-08 MED ORDER — ACETAMINOPHEN 500 MG PO TABS
ORAL_TABLET | ORAL | Status: AC
  Filled 2020-12-08: qty 2

## 2020-12-08 MED ORDER — PROMETHAZINE HCL 25 MG/ML IJ SOLN: 6.2500 mg | INTRAMUSCULAR | Status: DC | PRN

## 2020-12-08 MED ORDER — FENTANYL CITRATE (PF) 100 MCG/2ML IJ SOLN
INTRAMUSCULAR | Status: AC
  Filled 2020-12-08: qty 2

## 2020-12-08 MED ORDER — HYDROMORPHONE HCL 1 MG/ML IJ SOLN: 0.2500 mg | INTRAMUSCULAR | Status: DC | PRN

## 2020-12-08 MED ORDER — SODIUM CHLORIDE 0.9% FLUSH: 10.0000 mL | INTRAVENOUS | Status: DC | PRN

## 2020-12-08 MED ORDER — OXYCODONE HCL 5 MG PO TABS
5.0000 mg | ORAL_TABLET | Freq: Once | ORAL | Status: DC | PRN
Start: 2020-12-08 — End: 2020-12-08

## 2020-12-08 MED ORDER — ONDANSETRON HCL 4 MG/2ML IJ SOLN
INTRAMUSCULAR | Status: AC
  Filled 2020-12-08: qty 2

## 2020-12-08 MED ORDER — PHENYLEPHRINE 40 MCG/ML (10ML) SYRINGE FOR IV PUSH (FOR BLOOD PRESSURE SUPPORT)
PREFILLED_SYRINGE | INTRAVENOUS | Status: DC | PRN
  Administered 2020-12-08 (×2): 80 ug via INTRAVENOUS

## 2020-12-08 MED ORDER — OXYCODONE HCL 5 MG/5ML PO SOLN
5.0000 mg | Freq: Once | ORAL | Status: DC | PRN
Start: 2020-12-08 — End: 2020-12-08

## 2020-12-08 MED ORDER — HEPARIN SOD (PORK) LOCK FLUSH 100 UNIT/ML IV SOLN
500.0000 [IU] | Freq: Once | INTRAVENOUS | Status: AC
  Administered 2020-12-08: 500 [IU] via INTRAVENOUS

## 2020-12-08 MED ORDER — FENTANYL CITRATE (PF) 100 MCG/2ML IJ SOLN
INTRAMUSCULAR | Status: DC | PRN
  Administered 2020-12-08 (×2): 50 ug via INTRAVENOUS

## 2020-12-08 MED ORDER — MEPERIDINE HCL 25 MG/ML IJ SOLN: 6.2500 mg | INTRAMUSCULAR | Status: DC | PRN

## 2020-12-08 MED ORDER — ACETAMINOPHEN 500 MG PO TABS
1000.0000 mg | ORAL_TABLET | Freq: Once | ORAL | Status: AC
  Administered 2020-12-08: 1000 mg via ORAL

## 2020-12-08 MED ORDER — MIDAZOLAM HCL 2 MG/2ML IJ SOLN: 0.5000 mg | Freq: Once | INTRAMUSCULAR | Status: DC | PRN

## 2020-12-08 SURGICAL SUPPLY — 19 items
BNDG CONFORM 2 STRL LF (GAUZE/BANDAGES/DRESSINGS) IMPLANT
COVER WAND RF STERILE (DRAPES) ×3 IMPLANT
DILATOR CANAL MILEX (MISCELLANEOUS) IMPLANT
DRSG PAD ABDOMINAL 8X10 ST (GAUZE/BANDAGES/DRESSINGS) ×3 IMPLANT
GAUZE 4X4 16PLY RFD (DISPOSABLE) ×3 IMPLANT
GLOVE SURG ENC MOIS LTX SZ7.5 (GLOVE) ×6 IMPLANT
GOWN STRL REUS W/TWL LRG LVL3 (GOWN DISPOSABLE) ×3 IMPLANT
HOLDER FOLEY CATH W/STRAP (MISCELLANEOUS) ×3 IMPLANT
IV NS 1000ML (IV SOLUTION) ×3
IV NS 1000ML BAXH (IV SOLUTION) ×2 IMPLANT
IV SET EXTENSION GRAVITY 40 LF (IV SETS) ×3 IMPLANT
KIT TURNOVER CYSTO (KITS) ×3 IMPLANT
MAT PREVALON FULL STRYKER (MISCELLANEOUS) ×3 IMPLANT
PACK VAGINAL MINOR WOMEN LF (CUSTOM PROCEDURE TRAY) ×3 IMPLANT
PACKING VAGINAL (PACKING) IMPLANT
PAD OB MATERNITY 4.3X12.25 (PERSONAL CARE ITEMS) IMPLANT
TOWEL OR 17X26 10 PK STRL BLUE (TOWEL DISPOSABLE) ×3 IMPLANT
TRAY FOLEY W/BAG SLVR 14FR LF (SET/KITS/TRAYS/PACK) ×3 IMPLANT
WATER STERILE IRR 500ML POUR (IV SOLUTION) ×3 IMPLANT

## 2020-12-08 NOTE — Addendum Note (Signed)
Encounter addended by: Sherrlyn Hock, RN on: 12/08/2020 1:23 PM  Actions taken: Order list changed, MAR administration accepted

## 2020-12-08 NOTE — Op Note (Signed)
12/08/2020  8:58 AM  PATIENT:  Misty Deleon  70 y.o. female  PRE-OPERATIVE DIAGNOSIS:  cervical CA  POST-OPERATIVE DIAGNOSIS:  cervical CA  PROCEDURE:  Procedure(s): TANDEM RING INSERTION (N/A) OPERATIVE ULTRASOUND (N/A)  SURGEON:  Surgeon(s) and Role:    * Gery Pray, MD - Primary  PHYSICIAN ASSISTANT:   ASSISTANTS: none   ANESTHESIA:   general  EBL:  none   BLOOD ADMINISTERED:none  DRAINS: Urinary Catheter (Foley)   LOCAL MEDICATIONS USED:  NONE  SPECIMEN:  No Specimen  DISPOSITION OF SPECIMEN:  N/A  COUNTS:  YES  TOURNIQUET:  * No tourniquets in log *  DICTATION: The patient was transported to the outpatient OR room4. The patient was prepped and draped in the usual sterile fashion and placed in the dorsolithotomy position. Timeout was performed for the procedure. Patient had placement of a Foley catheter without difficulty. The bladder was then backfilled with approximately 200 cc of sterile saline for ultrasound imaging purposes. On exam under anesthesia the patient was noted to have a bandlike constriction in the distal vaginal area that would not stretch during general anesthesia. Given this narrow area it was determined that the tandem ring system would not work based on the patient's anatomy due to the difficulty of placing the ring within the upper vaginal cavity. The proximal vagina was also somewhat narrowed to the cervical os and therefore also a ring apparatus would not be able to placed adequately within the target area. On ultrasound imaging the patient was noted to have a anteverted uterus to approximately30-45degrees.The uterus sounded to approximately 7.5 cm.The patient then proceeded to undergo placement of a narrow speculum. The cervical os was visualized and the cervical os was dilated without difficulty.Some erythema was noted to the mucosa of the proximal vagina but no necrosis noted.ultrasound images were excellent. Again on  ultrasound measurements the cervical region measured approximately 2 cm in maximal dimension. After dilation of the cervical os,the patient then had placement of a 51mm30degree tandem within the endometrial cavity and endocervical canal. Good placement was verified on intraoperative ultrasound. Patient then had attached to this  tandem a 2.5cm diameter cylinder followed by 2.5 cm cylinder this was in 2 parts and a clamp was placed distally to ensure good placement. Final intraoperative ultrasound imaging showed good placement of the tandem cylinder for high-dose-rate radiation therapy. Patient tolerated the procedure well. She was subsequently transported to the recovery room in stable condition. Later in the day the patient will be taken to radiation oncology for planning and herfifth and final  high-dose-rate treatment. Patient will receive approximately 5.5 Gy to the high risk clinical target volume.  PLAN OF CARE: Transferred to radiation oncology for planning and treatment  PATIENT DISPOSITION:  PACU - hemodynamically stable.   Delay start of Pharmacological VTE agent (>24hrs) due to surgical blood loss or risk of bleeding: not applicable

## 2020-12-08 NOTE — Progress Notes (Signed)
  Radiation Oncology         [phone] ________________________________  Name: Misty Deleon MRN: [id number]  Date: 12/08/2020  DOB: Jul 02, 1951  CC: Alvester Chou, NP  Alvester Chou, NP  HDR BRACHYTHERAPY NOTE  DIAGNOSIS: Clincal stage IIIC-1 (cT2, cN1, cM0) poorly differentiated cervical cancer  Simple treatment device note: On the operating room the patient had construction of her custom tandem cylinder system. She will be treated with a 30 tandem/cylinder system with a 2.5 cm diameter segmented cylinder. This conforms to her anatomy without undue discomfort.  Vaginal brachytherapy procedure node: The patient was brought to the [facility] suite. Identity was confirmed. All relevant records and images related to the planned course of therapy were reviewed. The patient freely provided informed written consent to proceed with treatment after reviewing the details related to the planned course of therapy. The consent form was witnessed and verified by the simulation staff. Then, the patient was set-up in a stable reproducible supine position for radiation therapy. The tandem cylinder system was accessed and fiducial markers were placed within the tandem and cylinder.   Verification simulation note: An AP and lateral film was obtained through the pelvis area. This was compared to the patient's planning films documenting accurate position of the tandem/cylinder system for treatment.  High-dose-rate brachytherapy treatment note:   The remote afterloading device was accessed through catheter system and attached to the tandem cylinder system. Patient then proceeded to undergo her fifth high-dose-rate treatment directed at the cervix. The patient was prescribed a dose of 5.5 gray to be delivered to the high risk clinical target  volume.  Patient was treated with 1 channel using 16 dwell positions. Treatment time was 444.4 seconds. The patient tolerated the procedure well. After completion of her therapy, a  radiation survey was performed documenting return of the iridium source into the [employer] safe. The patient was then transferred to the nursing suite.  She then had removal of  the tandem and cylinder system. The patient tolerated the removal well.  PLAN: The patient will return for routine follow-up in one month. ________________________________     Blair Promise, PhD, MD  This document serves as a record of services personally performed by Gery Pray, MD. It was created on his behalf by Clerance Lav, a trained medical scribe. The creation of this record is based on the scribe's personal observations and the provider's statements to them. This document has been checked and approved by the attending provider.

## 2020-12-08 NOTE — Addendum Note (Signed)
Encounter addended by: Ranell Patrick, RN on: 12/08/2020 12:58 PM  Actions taken: Flowsheet accepted

## 2020-12-08 NOTE — Interval H&P Note (Signed)
History and Physical Interval Note:  12/08/2020 7:37 AM  Misty Deleon  has presented today for surgery, with the diagnosis of cervical CA.  The various methods of treatment have been discussed with the patient and family. After consideration of risks, benefits and other options for treatment, the patient has consented to  Procedure(s): TANDEM RING INSERTION (N/A) OPERATIVE ULTRASOUND (N/A) as a surgical intervention.  The patient's history has been reviewed, patient examined, no change in status, stable for surgery.  I have reviewed the patient's chart and labs.  Questions were answered to the patient's satisfaction.     Gery Pray

## 2020-12-08 NOTE — Anesthesia Procedure Notes (Signed)
Procedure Name: LMA Insertion Date/Time: 12/08/2020 7:47 AM Performed by: Genelle Bal, CRNA Pre-anesthesia Checklist: Patient identified, Emergency Drugs available, Suction available and Patient being monitored Patient Re-evaluated:Patient Re-evaluated prior to induction Oxygen Delivery Method: Circle system utilized Preoxygenation: Pre-oxygenation with 100% oxygen Induction Type: IV induction Ventilation: Mask ventilation without difficulty LMA: LMA inserted LMA Size: 3.0 Number of attempts: 1 Airway Equipment and Method: Bite block Placement Confirmation: positive ETCO2 Tube secured with: Tape Dental Injury: Teeth and Oropharynx as per pre-operative assessment

## 2020-12-08 NOTE — Transfer of Care (Signed)
Immediate Anesthesia Transfer of Care Note  Patient: Misty Deleon  Procedure(s) Performed: TANDEM RING INSERTION (N/A Cervix) OPERATIVE ULTRASOUND (N/A Pelvis)  Patient Location: PACU  Anesthesia Type:General  Level of Consciousness: awake, alert  and oriented  Airway & Oxygen Therapy: Patient Spontanous Breathing and Patient connected to face mask oxygen  Post-op Assessment: Report given to RN and Post -op Vital signs reviewed and stable  Post vital signs: Reviewed and stable  Last Vitals:  Vitals Value Taken Time  BP 105/64   Temp    Pulse 71 12/08/20 0827  Resp 14 12/08/20 0827  SpO2 100 % 12/08/20 0827  Vitals shown include unvalidated device data.  Last Pain:  Vitals:   12/08/20 0615  TempSrc: Oral  PainSc: 0-No pain      Patients Stated Pain Goal: 5 (47/20/72 1828)  Complications: No complications documented.

## 2020-12-08 NOTE — Progress Notes (Signed)
Following treatment the equipment was removed. Patient vitals were 138/64 blood pressure,temp 98.4 ,oxygen 100 % Pulse 97. Patient denies any pain. Patients port was deaccessed . Patient was given instructions on what to do if she experience any unusual sign or symptoms.  Patient was taken upstairs by Rn

## 2020-12-08 NOTE — Anesthesia Postprocedure Evaluation (Signed)
Anesthesia Post Note  Patient: Misty Deleon  Procedure(s) Performed: TANDEM RING INSERTION (N/A Cervix) OPERATIVE ULTRASOUND (N/A Pelvis)     Patient location during evaluation: PACU Anesthesia Type: General Level of consciousness: awake and alert, patient cooperative and oriented Pain management: pain level controlled Vital Signs Assessment: post-procedure vital signs reviewed and stable Respiratory status: spontaneous breathing, nonlabored ventilation and respiratory function stable Cardiovascular status: blood pressure returned to baseline and stable Postop Assessment: no apparent nausea or vomiting Anesthetic complications: no   No complications documented.  Last Vitals:  Vitals:   12/08/20 0845 12/08/20 0900  BP: 121/63 124/63  Pulse: 76 85  Resp: 12 14  Temp:  36.4 C  SpO2: 98% 96%    Last Pain:  Vitals:   12/08/20 0900  TempSrc:   PainSc: 0-No pain                 Ashtan Girtman,E. [name]

## 2020-12-09 ENCOUNTER — Encounter (HOSPITAL_BASED_OUTPATIENT_CLINIC_OR_DEPARTMENT_OTHER): Payer: Self-pay | Admitting: Radiation Oncology

## 2021-01-03 ENCOUNTER — Encounter: Payer: Self-pay | Admitting: Radiation Oncology

## 2021-01-12 ENCOUNTER — Telehealth: Payer: Self-pay | Admitting: Hematology and Oncology

## 2021-01-12 NOTE — Telephone Encounter (Signed)
R/s per 2/18 los, Patient aware 

## 2021-01-13 ENCOUNTER — Ambulatory Visit
Admission: RE | Admit: 2021-01-13 | Discharge: 2021-01-13 | Disposition: A | Discharge status: Home / Self Care | Payer: Medicare PPO | Source: Ambulatory Visit | Attending: Radiation Oncology | Admitting: Radiation Oncology

## 2021-01-13 ENCOUNTER — Other Ambulatory Visit: Payer: Self-pay

## 2021-01-13 ENCOUNTER — Encounter: Payer: Self-pay | Admitting: Radiation Oncology

## 2021-01-13 DIAGNOSIS — Z923 Personal history of irradiation: Secondary | ICD-10-CM | POA: Insufficient documentation

## 2021-01-13 DIAGNOSIS — C539 Malignant neoplasm of cervix uteri, unspecified: Secondary | ICD-10-CM | POA: Insufficient documentation

## 2021-01-13 HISTORY — DX: Personal history of irradiation: Z92.3

## 2021-01-13 NOTE — Progress Notes (Signed)
Misty Deleon is here today for follow up post radiation to the pelvic.  They completed their radiation on: 12/08/20  Does the patient complain of any of the following:  . Pain: Patient reports discomfort to hips . Abdominal bloating:  no . Diarrhea/Constipation: no . Nausea/Vomiting: no . Vaginal Discharge: no  . Blood in Urine or Stool: no . Urinary Issues (dysuria/incomplete emptying/ incontinence/ increased frequency/urgency): denies urinary issues . Does patient report using vaginal dilator 2-3 times a week and/or sexually active 2-3 weeks:Patient educated on vaginal dilator and importance of use. Patient voiced understanding.  Patient given xs+, s vaginal dilator with instructions. . Post radiation skin changes: no    Additional comments if applicable:  Vitals:   51/70/01 1155  BP: (!) 143/74  Pulse: (!) 109  Resp: 18  Temp: (!) 97.2 F (36.2 C)  TempSrc: Temporal  SpO2: 99%  Weight: 198 lb (89.8 kg)  Height: 5\' 2"  (1.575 m)

## 2021-01-13 NOTE — Progress Notes (Incomplete)
  Patient Name: Misty Deleon MRN: [id number] DOB: [birth year] Referring Physician: Alvester Chou (Profile Not Attached) Date of Service: 12/08/2020 [facility], [place]                                                        End Of Treatment Note  Diagnoses: C53.9-Malignant neoplasm of cervix uteri, unspecified  Cancer Staging: Clincal stage IIIC-1 (cT2, cN1, cM0) poorly differentiated cervical cancer  Intent: Curative  Radiation Treatment Dates: 09/22/2020 through 12/08/2020  Site: Cervix Technique: IMRT Total Dose (Gy): 45/45 Dose per Fx (Gy): 1.8 Completed Fx: 25/25 Beam Energies: 6x  Site: Cervix boost Technique: IMRT Total Dose (Gy): 7.2/7.2 Dose per Fx (Gy): 1.8 Completed Fx: 4/4 Beam Energies: 6x  Site: Cervix boost Technique: HDR-brachytherapy Total Dose (Gy): 27.5/27.5 Dose per Fx (Gy): 5.5 Completed Fx: 5/5 Beam Energies: Ir-192  Narrative: The patient tolerated radiation therapy relatively well. She did report loose stools, increased urination secondary to chemotherapy and increased hydration, and a scant amount of blood from vagina that had been baseline since diagnosis but improved with treatment. She denied pain, hematochezia, diarrhea, constipation, nausea, vomiting, vaginal discharge, and poor appetite.  Plan: The patient will follow-up with radiation oncology in one month.  ________________________________________________   Blair Promise, PhD, MD  This document serves as a record of services personally performed by Gery Pray, MD. It was created on his behalf by Clerance Lav, a trained medical scribe. The creation of this record is based on the scribe's personal observations and the provider's statements to them. This document has been checked and approved by the attending  provider.

## 2021-01-13 NOTE — Progress Notes (Signed)
Radiation Oncology         [phone] ________________________________  Name: Misty Deleon MRN: [id number]  Date: 01/13/2021  DOB: Jul 02, 1951  Follow-Up Visit Note  CC: Alvester Chou, NP  Alvester Chou, NP    ICD-10-CM   1. Malignant neoplasm of cervix, unspecified site [facility])  C53.9     Diagnosis: Clincal stage IIIC-1 (cT2, cN1, cM0) poorly differentiated cervical cancer  Interval Since Last Radiation: One month and five days  Radiation Treatment Dates: 09/22/2020 through 12/08/2020  Site: Pelvis Technique: IMRT Total Dose (Gy): 45/45 Dose per Fx (Gy): 1.8 Completed Fx: 25/25 Beam Energies: 6x  Site: pelvic boost Technique: IMRT Total Dose (Gy): 7.2/7.2 Dose per Fx (Gy): 1.8 Completed Fx: 4/4 Beam Energies: 6x  Site: Cervix boost Technique: HDR-brachytherapy Total Dose (Gy): 27.5/27.5 Dose per Fx (Gy): 5.5 Completed Fx: 5/5 Beam Energies: Ir-192  Narrative:  The patient returns today for routine follow-up. No significant interval history since the end of treatment.  On review of systems, she reports discomfort in the low bilateral lower sacroiliac region.  She notices particularly when going from sitting to standing.  She denies any numbness or weakness in her extremities. She denies vaginal bleeding or pelvic pain otherwise.  She continues to have some fatigue but otherwise overall is doing well..                       ALLERGIES:  has No Known Allergies.  Meds: Current Outpatient Medications  Medication Sig Dispense Refill  . amLODipine (NORVASC) 10 MG tablet Take 1 tablet (10 mg total) by mouth daily. (Patient taking differently: Take 10 mg by mouth at bedtime.) 30 tablet 11  . calcium carbonate (TUMS - DOSED IN MG ELEMENTAL CALCIUM) 500 MG chewable tablet Chew 1 tablet by mouth as needed for indigestion or heartburn.    . ezetimibe (ZETIA) 10 MG tablet Take 10 mg by mouth daily.    Marland Kitchen lidocaine-prilocaine (EMLA) cream Apply to affected area once 30 g 3  .  magnesium oxide (MAG-OX) 400 (241.3 Mg) MG tablet Take 1 tablet (400 mg total) by mouth 2 (two) times daily. 60 tablet 11  . metFORMIN (GLUCOPHAGE) 1000 MG tablet Take 1,000 mg by mouth 2 (two) times daily with a meal.    . rosuvastatin (CRESTOR) 40 MG tablet Take 40 mg by mouth at bedtime.    . sitaGLIPtin (JANUVIA) 50 MG tablet Take 50 mg by mouth 2 (two) times daily.     No current facility-administered medications for this encounter.    Physical Findings: The patient is in no acute distress. Patient is alert and oriented.  height is 5\' 2"  (1.575 m) and weight is 198 lb (89.8 kg). Her temporal temperature is 97.2 F (36.2 C) (abnormal). Her blood pressure is 143/74 (abnormal) and her pulse is 109 (abnormal). Her respiration is 18 and oxygen saturation is 99%.  No significant changes. Lungs are clear to auscultation bilaterally. Heart has regular rate and rhythm. No palpable cervical, supraclavicular, or axillary adenopathy. Abdomen soft, non-tender, normal bowel sounds. Pelvic exam deferred in light of recent treatment.   Lab Findings: Lab Results  Component Value Date   WBC 3.8 (L) 11/10/2020   HGB 9.5 (L) 12/08/2020   HCT 28.0 (L) 12/08/2020   MCV 84.7 11/10/2020   PLT 189 11/10/2020    Radiographic Findings: No results found.  Impression: Clincal stage IIIC-1 (cT2, cN1, cM0) poorly differentiated cervical cancer  The patient is recovering from the  effects of radiation.  Overall she tolerated her course of external beam and brachytherapy with chemotherapy quite well.  Today the patient was given a vaginal dilator and instructions on its use.  Plan: The patient is scheduled to follow up with Dr. Alvy Bimler on 01/27/2021. She will follow up with Dr. Berline Lopes in two months and with radiation oncology in five months.  She will have a PET scan approximately 3 months treatment.    ____________________________________   Blair Promise, PhD, MD  This document serves as a record of  services personally performed by Gery Pray, MD. It was created on his behalf by Clerance Lav, a trained medical scribe. The creation of this record is based on the scribe's personal observations and the provider's statements to them. This document has been checked and approved by the attending provider.

## 2021-01-13 NOTE — Patient Instructions (Signed)

## 2021-01-26 ENCOUNTER — Ambulatory Visit: Payer: Medicare PPO | Admitting: Hematology and Oncology

## 2021-01-26 ENCOUNTER — Other Ambulatory Visit: Payer: Medicare PPO

## 2021-01-26 ENCOUNTER — Ambulatory Visit: Payer: Medicare PPO

## 2021-01-27 ENCOUNTER — Other Ambulatory Visit: Payer: Self-pay

## 2021-01-27 ENCOUNTER — Inpatient Hospital Stay: Payer: Medicare PPO | Attending: Gynecologic Oncology

## 2021-01-27 ENCOUNTER — Encounter: Payer: Self-pay | Admitting: Hematology and Oncology

## 2021-01-27 ENCOUNTER — Inpatient Hospital Stay: Payer: Medicare PPO

## 2021-01-27 ENCOUNTER — Inpatient Hospital Stay: Payer: Medicare PPO | Admitting: Hematology and Oncology

## 2021-01-27 VITALS — BP 151/79 | HR 101 | Temp 98.0°F | Resp 18 | Wt 197.0 lb

## 2021-01-27 DIAGNOSIS — R600 Localized edema: Secondary | ICD-10-CM | POA: Diagnosis not present

## 2021-01-27 DIAGNOSIS — D539 Nutritional anemia, unspecified: Secondary | ICD-10-CM

## 2021-01-27 DIAGNOSIS — N183 Chronic kidney disease, stage 3 unspecified: Secondary | ICD-10-CM | POA: Diagnosis not present

## 2021-01-27 DIAGNOSIS — E538 Deficiency of other specified B group vitamins: Secondary | ICD-10-CM

## 2021-01-27 DIAGNOSIS — Z7984 Long term (current) use of oral hypoglycemic drugs: Secondary | ICD-10-CM | POA: Diagnosis not present

## 2021-01-27 DIAGNOSIS — Z79899 Other long term (current) drug therapy: Secondary | ICD-10-CM | POA: Diagnosis not present

## 2021-01-27 DIAGNOSIS — G8929 Other chronic pain: Secondary | ICD-10-CM

## 2021-01-27 DIAGNOSIS — C539 Malignant neoplasm of cervix uteri, unspecified: Secondary | ICD-10-CM | POA: Insufficient documentation

## 2021-01-27 DIAGNOSIS — I129 Hypertensive chronic kidney disease with stage 1 through stage 4 chronic kidney disease, or unspecified chronic kidney disease: Secondary | ICD-10-CM | POA: Diagnosis not present

## 2021-01-27 DIAGNOSIS — Z9221 Personal history of antineoplastic chemotherapy: Secondary | ICD-10-CM | POA: Diagnosis not present

## 2021-01-27 DIAGNOSIS — I10 Essential (primary) hypertension: Secondary | ICD-10-CM

## 2021-01-27 DIAGNOSIS — M545 Low back pain, unspecified: Secondary | ICD-10-CM

## 2021-01-27 LAB — CBC WITH DIFFERENTIAL (CANCER CENTER ONLY)
Abs Immature Granulocytes: 0.02 10*3/uL (ref 0.00–0.07)
Basophils Absolute: 0 10*3/uL (ref 0.0–0.1)
Basophils Relative: 0 %
Eosinophils Absolute: 0.1 10*3/uL (ref 0.0–0.5)
Eosinophils Relative: 2 %
HCT: 29.3 % — ABNORMAL LOW (ref 36.0–46.0)
Hemoglobin: 10.8 g/dL — ABNORMAL LOW (ref 12.0–15.0)
Immature Granulocytes: 1 %
Lymphocytes Relative: 12 %
Lymphs Abs: 0.5 10*3/uL — ABNORMAL LOW (ref 0.7–4.0)
MCH: 33.6 pg (ref 26.0–34.0)
MCHC: 36.9 g/dL — ABNORMAL HIGH (ref 30.0–36.0)
MCV: 91.3 fL (ref 80.0–100.0)
Monocytes Absolute: 0.3 10*3/uL (ref 0.1–1.0)
Monocytes Relative: 7 %
Neutro Abs: 3.4 10*3/uL (ref 1.7–7.7)
Neutrophils Relative %: 78 %
Platelet Count: 183 10*3/uL (ref 150–400)
RBC: 3.21 MIL/uL — ABNORMAL LOW (ref 3.87–5.11)
RDW: 13.2 % (ref 11.5–15.5)
WBC Count: 4.4 10*3/uL (ref 4.0–10.5)
nRBC: 0 % (ref 0.0–0.2)

## 2021-01-27 LAB — BASIC METABOLIC PANEL - CANCER CENTER ONLY
Anion gap: 12 (ref 5–15)
BUN: 12 mg/dL (ref 8–23)
CO2: 26 mmol/L (ref 22–32)
Calcium: 9.7 mg/dL (ref 8.9–10.3)
Chloride: 102 mmol/L (ref 98–111)
Creatinine: 1.14 mg/dL — ABNORMAL HIGH (ref 0.44–1.00)
GFR, Estimated: 52 mL/min — ABNORMAL LOW (ref >60)
Glucose, Bld: 106 mg/dL — ABNORMAL HIGH (ref 70–99)
Potassium: 3.6 mmol/L (ref 3.5–5.1)
Sodium: 140 mmol/L (ref 135–145)

## 2021-01-27 LAB — SAMPLE TO BLOOD BANK

## 2021-01-27 LAB — IRON AND TIBC
Iron: 120 ug/dL (ref 41–142)
Saturation Ratios: 32 % (ref 21–57)
TIBC: 373 ug/dL (ref 236–444)
UIBC: 252 ug/dL (ref 120–384)

## 2021-01-27 LAB — FERRITIN: Ferritin: 303 ng/mL (ref 11–307)

## 2021-01-27 LAB — VITAMIN B12: Vitamin B-12: 188 pg/mL (ref 180–914)

## 2021-01-27 LAB — MAGNESIUM: Magnesium: 1.7 mg/dL (ref 1.7–2.4)

## 2021-01-27 MED ORDER — HEPARIN SOD (PORK) LOCK FLUSH 100 UNIT/ML IV SOLN
500.0000 [IU] | Freq: Once | INTRAVENOUS | Status: AC
  Administered 2021-01-27: 500 [IU]
  Filled 2021-01-27: qty 5

## 2021-01-27 MED ORDER — SITAGLIPTIN PHOSPHATE 50 MG PO TABS: 50.0000 mg | ORAL_TABLET | Freq: Two times a day (BID) | ORAL | 2 refills | Status: DC

## 2021-01-27 MED ORDER — SODIUM CHLORIDE 0.9% FLUSH
10.0000 mL | Freq: Once | INTRAVENOUS | Status: AC
  Administered 2021-01-27: 10 mL
  Filled 2021-01-27: qty 10

## 2021-01-27 MED ORDER — CYANOCOBALAMIN 1000 MCG/ML IJ SOLN
1000.0000 ug | Freq: Once | INTRAMUSCULAR | Status: AC
  Administered 2021-01-27: 1000 ug via INTRAMUSCULAR

## 2021-01-27 MED ORDER — CYANOCOBALAMIN 1000 MCG/ML IJ SOLN
INTRAMUSCULAR | Status: AC
  Filled 2021-01-27: qty 1

## 2021-01-27 NOTE — Progress Notes (Signed)
[place] OFFICE PROGRESS NOTE  Patient Care Team: Alvester Chou, NP as PCP - General (Nurse Practitioner) Jacqulyn Liner, RN as Oncology Nurse Navigator (Oncology)  ASSESSMENT & PLAN:  Cervical cancer [facility]) She felt good Majority of the side effects of treatment are resolved I plan to order PET/CT imaging in July for objective assessment of response to therapy  Vitamin B12 deficiency Vitamin B12 level came back borderline low She has received vitamin B12 injection today and will receive another dose in July along with her port flush appointment I also recommend oral vitamin B12 supplement at home and I plan to recheck it again in her next visit  Chronic kidney disease (CKD), stage III (moderate) (HCC) Renal function is stable Observe closely   Essential hypertension Her blood pressure is elevated Her legs are swollen I recommend discontinuation of amlodipine and resume lisinopril  Low back ache She has mild lower back ache since her last dose of brachytherapy I recommend acetaminophen as needed   Orders Placed This Encounter  Procedures  . NM PET Image Restage (PS) Skull Base to Thigh    Standing Status:   Future    Standing Expiration Date:   01/27/2022    Order Specific Question:   If indicated for the ordered procedure, I authorize the administration of a radiopharmaceutical per Radiology protocol    Answer:   Yes    Order Specific Question:   Preferred imaging location?    Answer:   [facility]    Order Specific Question:   Radiology Contrast Protocol - do NOT remove file path    Answer:   [email or web address]    All questions were answered. The patient knows to call the clinic with any problems, questions or concerns. The total time spent in the appointment was 20 minutes encounter with patients including review of chart and various tests results, discussions about plan of care and coordination of care  plan   Heath Lark, MD 01/27/2021 3:14 PM  INTERVAL HISTORY: Please see below for problem oriented charting. She returns for further follow-up She is doing well She noted intermittent leg swelling and mild back ache since her last dose of radiation No vaginal bleeding Denies changes in bowel habits or nausea No residual peripheral neuropathy  SUMMARY OF ONCOLOGIC HISTORY: Oncology History  Cervical cancer ([place])  08/13/2020 Pathology Results   Cervical biopsy showed poorly differentiated carcinoma. 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   08/27/2020 Imaging   MRI pelvis Large cervical mass with pelvic adenopathy, at least T2B N1 disease. Question of involvement of posterior urinary bladder (T4) for which additional images have been requested. Patient will return for additional images to answer this question. (Thinner section axial T2 without fat sat and sagittal T2 weighted imaging also without fat saturation.)   Small field-of-view images show that the parametrial extension comes in very close proximity to the RIGHT ureter.   08/30/2020 Initial Diagnosis   Cervical cancer ([place])   08/30/2020 Cancer Staging   Staging form: Cervix Uteri, AJCC Version 9 - Clinical stage from 08/30/2020: Stage IIIC1 (cT2, cN1, cM0) - Signed by Heath Lark, MD on 08/30/2020   09/01/2020 PET scan   1. Large cervical mass extending into the endometrial canal to the level of the uterine fundus, associated with pelvic adenopathy to the level of the RIGHT common iliac chain. Please refer to MRI for further detail. 2. No signs of solid organ uptake or metastatic disease to  the chest. 3. Subcutaneous nodule with marked increased metabolic activity, correlation with direct clinical inspection is suggested to exclude cutaneous neoplasm or subcutaneous nodule in this location. Complicated sebaceous cyst could also potentially have this appearance. 4. Uptake in the anal canal could  likely physiologic. Given the intense nature of the uptake would suggest correlation with direct clinical inspection/examination. 5. Uptake in axillary lymph nodes with activity less than mediastinal blood pool likely small reactive lymph nodes, attention on follow-up. Morphologic features also with benign appearance.   09/16/2020 Procedure   Successful placement of a power injectable Port-A-Cath via the right internal jugular vein. The catheter is ready for immediate use.     09/20/2020 - 10/25/2020 Chemotherapy         Malignant neoplasm of cervix ([place])  09/01/2020 Initial Diagnosis   Malignant neoplasm of cervix ([place])   09/20/2020 - 10/25/2020 Chemotherapy           REVIEW OF SYSTEMS:   Constitutional: Denies fevers, chills or abnormal weight loss Eyes: Denies blurriness of vision Ears, nose, mouth, throat, and face: Denies mucositis or sore throat Respiratory: Denies cough, dyspnea or wheezes Cardiovascular: Denies palpitation, chest discomfort  Gastrointestinal:  Denies nausea, heartburn or change in bowel habits Skin: Denies abnormal skin rashes Lymphatics: Denies new lymphadenopathy or easy bruising Neurological:Denies numbness, tingling or new weaknesses Behavioral/Psych: Mood is stable, no new changes  All other systems were reviewed with the patient and are negative.  I have reviewed the past medical history, past surgical history, social history and family history with the patient and they are unchanged from previous note.  ALLERGIES:  has No Known Allergies.  MEDICATIONS:  Current Outpatient Medications  Medication Sig Dispense Refill  . lisinopril (ZESTRIL) 20 MG tablet Take 20 mg by mouth daily.    . calcium carbonate (TUMS - DOSED IN MG ELEMENTAL CALCIUM) 500 MG chewable tablet Chew 1 tablet by mouth as needed for indigestion or heartburn.    . ezetimibe (ZETIA) 10 MG tablet Take 10 mg by mouth daily.    Marland Kitchen lidocaine-prilocaine (EMLA) cream Apply to affected area  once 30 g 3  . magnesium oxide (MAG-OX) 400 (241.3 Mg) MG tablet Take 1 tablet (400 mg total) by mouth 2 (two) times daily. 60 tablet 11  . metFORMIN (GLUCOPHAGE) 1000 MG tablet Take 1,000 mg by mouth 2 (two) times daily with a meal.    . rosuvastatin (CRESTOR) 40 MG tablet Take 40 mg by mouth at bedtime.    . sitaGLIPtin (JANUVIA) 50 MG tablet Take 1 tablet (50 mg total) by mouth 2 (two) times daily. 60 tablet 2   No current facility-administered medications for this visit.    PHYSICAL EXAMINATION: ECOG PERFORMANCE STATUS: 1 - Symptomatic but completely ambulatory  Vitals:   01/27/21 1211  BP: (!) 151/79  Pulse: (!) 101  Resp: 18  Temp: 98 F (36.7 C)  SpO2: 100%   Filed Weights   01/27/21 1211  Weight: 197 lb (89.4 kg)    GENERAL:alert, no distress and comfortable HEART: She has lower extremity edema NEURO: alert & oriented x 3 with fluent speech, no focal motor/sensory deficits  LABORATORY DATA:  I have reviewed the data as listed    Component Value Date/Time   NA 140 01/27/2021 1140   K 3.6 01/27/2021 1140   CL 102 01/27/2021 1140   CO2 26 01/27/2021 1140   GLUCOSE 106 (H) 01/27/2021 1140   BUN 12 01/27/2021 1140   CREATININE 1.14 (H) 01/27/2021 1140  CALCIUM 9.7 01/27/2021 1140   GFRNONAA 52 (L) 01/27/2021 1140    No results found for: SPEP, UPEP  Lab Results  Component Value Date   WBC 4.4 01/27/2021   NEUTROABS 3.4 01/27/2021   HGB 10.8 (L) 01/27/2021   HCT 29.3 (L) 01/27/2021   MCV 91.3 01/27/2021   PLT 183 01/27/2021      Chemistry      Component Value Date/Time   NA 140 01/27/2021 1140   K 3.6 01/27/2021 1140   CL 102 01/27/2021 1140   CO2 26 01/27/2021 1140   BUN 12 01/27/2021 1140   CREATININE 1.14 (H) 01/27/2021 1140      Component Value Date/Time   CALCIUM 9.7 01/27/2021 1140

## 2021-01-27 NOTE — Assessment & Plan Note (Signed)
She has mild lower back ache since her last dose of brachytherapy I recommend acetaminophen as needed

## 2021-01-27 NOTE — Assessment & Plan Note (Signed)
Her blood pressure is elevated Her legs are swollen I recommend discontinuation of amlodipine and resume lisinopril

## 2021-01-27 NOTE — Assessment & Plan Note (Addendum)
She felt good Majority of the side effects of treatment are resolved I plan to order PET/CT imaging in July for objective assessment of response to therapy

## 2021-01-27 NOTE — Assessment & Plan Note (Signed)
Renal function is stable Observe closely  

## 2021-01-27 NOTE — Assessment & Plan Note (Addendum)
Vitamin B12 level came back borderline low She has received vitamin B12 injection today and will receive another dose in July along with her port flush appointment I also recommend oral vitamin B12 supplement at home and I plan to recheck it again in her next visit

## 2021-02-01 ENCOUNTER — Other Ambulatory Visit: Payer: Self-pay

## 2021-02-01 MED ORDER — SITAGLIPTIN PHOSPHATE 100 MG PO TABS: 100.0000 mg | ORAL_TABLET | Freq: Every day | ORAL | 2 refills | Status: DC

## 2021-03-01 ENCOUNTER — Telehealth: Payer: Self-pay | Admitting: *Deleted

## 2021-03-01 NOTE — Telephone Encounter (Signed)
CALLED PATIENT TO INFORM OF FU APPT. WITH DR. Berline Deleon ON 03-18-21- ARRIVAL TIME- 2:30 PM, SPOKE  WITH PATIENT AND SHE IS AWARE OF THIS APPT.

## 2021-03-11 ENCOUNTER — Encounter: Payer: Self-pay | Admitting: Hematology and Oncology

## 2021-03-17 ENCOUNTER — Inpatient Hospital Stay: Payer: Medicare PPO | Attending: Gynecologic Oncology

## 2021-03-17 ENCOUNTER — Other Ambulatory Visit: Payer: Self-pay

## 2021-03-17 ENCOUNTER — Ambulatory Visit (HOSPITAL_COMMUNITY)
Admission: RE | Admit: 2021-03-17 | Discharge: 2021-03-17 | Disposition: A | Discharge status: Home / Self Care | Payer: Medicare PPO | Source: Ambulatory Visit | Attending: Hematology and Oncology | Admitting: Hematology and Oncology

## 2021-03-17 VITALS — BP 135/70 | HR 89 | Resp 18

## 2021-03-17 DIAGNOSIS — I129 Hypertensive chronic kidney disease with stage 1 through stage 4 chronic kidney disease, or unspecified chronic kidney disease: Secondary | ICD-10-CM | POA: Diagnosis not present

## 2021-03-17 DIAGNOSIS — Z79899 Other long term (current) drug therapy: Secondary | ICD-10-CM | POA: Insufficient documentation

## 2021-03-17 DIAGNOSIS — E162 Hypoglycemia, unspecified: Secondary | ICD-10-CM | POA: Insufficient documentation

## 2021-03-17 DIAGNOSIS — D649 Anemia, unspecified: Secondary | ICD-10-CM | POA: Insufficient documentation

## 2021-03-17 DIAGNOSIS — N183 Chronic kidney disease, stage 3 unspecified: Secondary | ICD-10-CM | POA: Insufficient documentation

## 2021-03-17 DIAGNOSIS — E538 Deficiency of other specified B group vitamins: Secondary | ICD-10-CM | POA: Insufficient documentation

## 2021-03-17 DIAGNOSIS — C539 Malignant neoplasm of cervix uteri, unspecified: Secondary | ICD-10-CM | POA: Insufficient documentation

## 2021-03-17 DIAGNOSIS — E1122 Type 2 diabetes mellitus with diabetic chronic kidney disease: Secondary | ICD-10-CM | POA: Diagnosis not present

## 2021-03-17 DIAGNOSIS — Z9221 Personal history of antineoplastic chemotherapy: Secondary | ICD-10-CM | POA: Diagnosis not present

## 2021-03-17 DIAGNOSIS — Z7984 Long term (current) use of oral hypoglycemic drugs: Secondary | ICD-10-CM | POA: Insufficient documentation

## 2021-03-17 DIAGNOSIS — Z923 Personal history of irradiation: Secondary | ICD-10-CM | POA: Diagnosis not present

## 2021-03-17 LAB — CBC WITH DIFFERENTIAL (CANCER CENTER ONLY)
Abs Immature Granulocytes: 0.02 10*3/uL (ref 0.00–0.07)
Basophils Absolute: 0 10*3/uL (ref 0.0–0.1)
Basophils Relative: 0 %
Eosinophils Absolute: 0.3 10*3/uL (ref 0.0–0.5)
Eosinophils Relative: 5 %
HCT: 28 % — ABNORMAL LOW (ref 36.0–46.0)
Hemoglobin: 10 g/dL — ABNORMAL LOW (ref 12.0–15.0)
Immature Granulocytes: 0 %
Lymphocytes Relative: 14 %
Lymphs Abs: 0.7 10*3/uL (ref 0.7–4.0)
MCH: 33.3 pg (ref 26.0–34.0)
MCHC: 35.7 g/dL (ref 30.0–36.0)
MCV: 93.3 fL (ref 80.0–100.0)
Monocytes Absolute: 0.3 10*3/uL (ref 0.1–1.0)
Monocytes Relative: 7 %
Neutro Abs: 3.4 10*3/uL (ref 1.7–7.7)
Neutrophils Relative %: 74 %
Platelet Count: 165 10*3/uL (ref 150–400)
RBC: 3 MIL/uL — ABNORMAL LOW (ref 3.87–5.11)
RDW: 12.8 % (ref 11.5–15.5)
WBC Count: 4.7 10*3/uL (ref 4.0–10.5)
nRBC: 0 % (ref 0.0–0.2)

## 2021-03-17 LAB — BASIC METABOLIC PANEL - CANCER CENTER ONLY
Anion gap: 10 (ref 5–15)
BUN: 25 mg/dL — ABNORMAL HIGH (ref 8–23)
CO2: 27 mmol/L (ref 22–32)
Calcium: 10.1 mg/dL (ref 8.9–10.3)
Chloride: 103 mmol/L (ref 98–111)
Creatinine: 1.82 mg/dL — ABNORMAL HIGH (ref 0.44–1.00)
GFR, Estimated: 30 mL/min — ABNORMAL LOW (ref >60)
Glucose, Bld: 99 mg/dL (ref 70–99)
Potassium: 4 mmol/L (ref 3.5–5.1)
Sodium: 140 mmol/L (ref 135–145)

## 2021-03-17 LAB — GLUCOSE, CAPILLARY: Glucose-Capillary: 91 mg/dL (ref 70–99)

## 2021-03-17 MED ORDER — CYANOCOBALAMIN 1000 MCG/ML IJ SOLN
INTRAMUSCULAR | Status: AC
  Filled 2021-03-17: qty 1

## 2021-03-17 MED ORDER — FLUDEOXYGLUCOSE F - 18 (FDG) INJECTION
9.5000 | Freq: Once | INTRAVENOUS | Status: AC | PRN
  Administered 2021-03-17: 9.5 via INTRAVENOUS

## 2021-03-17 MED ORDER — CYANOCOBALAMIN 1000 MCG/ML IJ SOLN
1000.0000 ug | Freq: Once | INTRAMUSCULAR | Status: AC
Start: 2021-03-17 — End: 2021-03-17
  Administered 2021-03-17: 1000 ug via INTRAMUSCULAR

## 2021-03-17 MED ORDER — SODIUM CHLORIDE 0.9% FLUSH
10.0000 mL | Freq: Once | INTRAVENOUS | Status: AC
  Administered 2021-03-17: 10 mL
  Filled 2021-03-17: qty 10

## 2021-03-17 MED ORDER — HEPARIN SOD (PORK) LOCK FLUSH 100 UNIT/ML IV SOLN
500.0000 [IU] | Freq: Once | INTRAVENOUS | Status: DC
  Filled 2021-03-17: qty 5

## 2021-03-17 NOTE — Patient Instructions (Signed)

## 2021-03-18 ENCOUNTER — Inpatient Hospital Stay: Payer: Medicare PPO | Admitting: Gynecologic Oncology

## 2021-03-18 ENCOUNTER — Encounter: Payer: Self-pay | Admitting: Hematology and Oncology

## 2021-03-18 ENCOUNTER — Inpatient Hospital Stay: Payer: Medicare PPO | Admitting: Hematology and Oncology

## 2021-03-18 DIAGNOSIS — C539 Malignant neoplasm of cervix uteri, unspecified: Secondary | ICD-10-CM | POA: Diagnosis not present

## 2021-03-18 DIAGNOSIS — D539 Nutritional anemia, unspecified: Secondary | ICD-10-CM | POA: Diagnosis not present

## 2021-03-18 DIAGNOSIS — N183 Chronic kidney disease, stage 3 unspecified: Secondary | ICD-10-CM | POA: Diagnosis not present

## 2021-03-18 DIAGNOSIS — I10 Essential (primary) hypertension: Secondary | ICD-10-CM | POA: Diagnosis not present

## 2021-03-18 DIAGNOSIS — E119 Type 2 diabetes mellitus without complications: Secondary | ICD-10-CM

## 2021-03-18 NOTE — Assessment & Plan Note (Signed)
Her PET CT scan is reviewed with the patient She has no trace of disease She will follow with GYN oncologist for pelvic exam I recommend we keep the port for 2 years I would like to see her back in about 8 weeks for further follow-up with repeat blood work

## 2021-03-18 NOTE — Addendum Note (Signed)
Addended by: Baruch Merl on: 03/18/2021 02:40 PM   Modules accepted: Orders

## 2021-03-18 NOTE — Assessment & Plan Note (Signed)
She has acute on chronic renal failure due to dehydration I recommend discontinuation of metformin and lisinopril I reminded the patient she needs to drink at least 80 ounces of fluid per day

## 2021-03-18 NOTE — Assessment & Plan Note (Signed)
Her blood sugar is now low I recommend discontinuation of metformin

## 2021-03-18 NOTE — Assessment & Plan Note (Signed)
She has multifactorial anemia, likely anemia of chronic renal failure, B12 deficiency and others A plan to recheck iron studies and B12 level in her next visit

## 2021-03-18 NOTE — Assessment & Plan Note (Signed)
Her blood pressure is now running low I have discontinue lisinopril

## 2021-03-18 NOTE — Progress Notes (Signed)
[place] OFFICE PROGRESS NOTE  Patient Care Team: Alvester Chou, NP as PCP - General (Nurse Practitioner) Jacqulyn Liner, RN as Oncology Nurse Navigator (Oncology)  ASSESSMENT & PLAN:  Cervical cancer [facility]) Her PET CT scan is reviewed with the patient She has no trace of disease She will follow with GYN oncologist for pelvic exam I recommend we keep the port for 2 years I would like to see her back in about 8 weeks for further follow-up with repeat blood work  Chronic kidney disease (CKD), stage III (moderate) ([place]) She has acute on chronic renal failure due to dehydration I recommend discontinuation of metformin and lisinopril I reminded the patient she needs to drink at least 80 ounces of fluid per day  Deficiency anemia She has multifactorial anemia, likely anemia of chronic renal failure, B12 deficiency and others A plan to recheck iron studies and B12 level in her next visit  Essential hypertension Her blood pressure is now running low I have discontinue lisinopril  Diabetes mellitus without complication ([place]) Her blood sugar is now low I recommend discontinuation of metformin  Orders Placed This Encounter  Procedures   Iron and TIBC    Standing Status:   Standing    Number of Occurrences:   1    Standing Expiration Date:   03/18/2022   Vitamin B12    Standing Status:   Standing    Number of Occurrences:   1    Standing Expiration Date:   03/18/2022   Ferritin    Standing Status:   Standing    Number of Occurrences:   1    Standing Expiration Date:   03/18/2022    All questions were answered. The patient knows to call the clinic with any problems, questions or concerns. The total time spent in the appointment was 30 minutes encounter with patients including review of chart and various tests results, discussions about plan of care and coordination of care plan   Heath Lark, MD 03/18/2021 2:40 PM  INTERVAL HISTORY: Please see below for problem oriented  charting. She returns for treatment and follow-up She have lost almost 10 pounds of weight since last time I saw her She admits that she is not drinking a lot of water, perhaps 2 bottles per day She has very mild trace neuropathy but does not bother her  SUMMARY OF ONCOLOGIC HISTORY: Oncology History  Cervical cancer ([place])  08/13/2020 Pathology Results   Cervical biopsy showed poorly differentiated carcinoma. 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   08/27/2020 Imaging   MRI pelvis Large cervical mass with pelvic adenopathy, at least T2B N1 disease. Question of involvement of posterior urinary bladder (T4) for which additional images have been requested. Patient will return for additional images to answer this question. (Thinner section axial T2 without fat sat and sagittal T2 weighted imaging also without fat saturation.)   Small field-of-view images show that the parametrial extension comes in very close proximity to the RIGHT ureter.   08/30/2020 Initial Diagnosis   Cervical cancer ([place])   08/30/2020 Cancer Staging   Staging form: Cervix Uteri, AJCC Version 9 - Clinical stage from 08/30/2020: Stage IIIC1 (cT2, cN1, cM0) - Signed by Heath Lark, MD on 08/30/2020    09/01/2020 PET scan   1. Large cervical mass extending into the endometrial canal to the level of the uterine fundus, associated with pelvic adenopathy to the level of the RIGHT common iliac chain. Please refer to  MRI for further detail. 2. No signs of solid organ uptake or metastatic disease to the chest. 3. Subcutaneous nodule with marked increased metabolic activity, correlation with direct clinical inspection is suggested to exclude cutaneous neoplasm or subcutaneous nodule in this location. Complicated sebaceous cyst could also potentially have this appearance. 4. Uptake in the anal canal could likely physiologic. Given the intense nature of the uptake would suggest correlation with  direct clinical inspection/examination. 5. Uptake in axillary lymph nodes with activity less than mediastinal blood pool likely small reactive lymph nodes, attention on follow-up. Morphologic features also with benign appearance.   09/16/2020 Procedure   Successful placement of a power injectable Port-A-Cath via the right internal jugular vein. The catheter is ready for immediate use.     09/20/2020 - 10/25/2020 Chemotherapy   She received cisplatin chemo with concurrent radiation       09/22/2020 - 12/08/2020 Radiation Therapy   Radiation Treatment Dates: 09/22/2020 through 12/08/2020   Site: Pelvis Technique: IMRT Total Dose (Gy): 45/45 Dose per Fx (Gy): 1.8 Completed Fx: 25/25 Beam Energies: 6x   Site: pelvic boost Technique: IMRT Total Dose (Gy): 7.2/7.2 Dose per Fx (Gy): 1.8 Completed Fx: 4/4 Beam Energies: 6x   Site: Cervix boost Technique: HDR-brachytherapy Total Dose (Gy): 27.5/27.5 Dose per Fx (Gy): 5.5 Completed Fx: 5/5 Beam Energies: Ir-192     03/17/2021 PET scan   1. Mark positive response to therapy. 2. Resolution of metabolic activity within the uterine body. 3. Resolution of size and metabolic activity of pelvic lymph nodes. 4. No evidence of new disease outside of the pelvis.   Malignant neoplasm of cervix ([place])  09/01/2020 Initial Diagnosis   Malignant neoplasm of cervix ([place])    09/20/2020 - 10/25/2020 Chemotherapy   She received cisplatin chemo with concurrent radiation         REVIEW OF SYSTEMS:   Constitutional: Denies fevers, chills or abnormal weight loss Eyes: Denies blurriness of vision Ears, nose, mouth, throat, and face: Denies mucositis or sore throat Respiratory: Denies cough, dyspnea or wheezes Cardiovascular: Denies palpitation, chest discomfort or lower extremity swelling Gastrointestinal:  Denies nausea, heartburn or change in bowel habits Skin: Denies abnormal skin rashes Lymphatics: Denies new lymphadenopathy or easy  bruising Neurological:Denies numbness, tingling or new weaknesses Behavioral/Psych: Mood is stable, no new changes  All other systems were reviewed with the patient and are negative.  I have reviewed the past medical history, past surgical history, social history and family history with the patient and they are unchanged from previous note.  ALLERGIES:  has No Known Allergies.  MEDICATIONS:  Current Outpatient Medications  Medication Sig Dispense Refill   vitamin B-12 (CYANOCOBALAMIN) 1000 MCG tablet Take 1,000 mcg by mouth daily.     calcium carbonate (TUMS - DOSED IN MG ELEMENTAL CALCIUM) 500 MG chewable tablet Chew 1 tablet by mouth as needed for indigestion or heartburn.     ezetimibe (ZETIA) 10 MG tablet Take 10 mg by mouth daily.     lidocaine-prilocaine (EMLA) cream Apply to affected area once 30 g 3   magnesium oxide (MAG-OX) 400 (241.3 Mg) MG tablet Take 1 tablet (400 mg total) by mouth 2 (two) times daily. 60 tablet 11   rosuvastatin (CRESTOR) 40 MG tablet Take 40 mg by mouth at bedtime.     sitaGLIPtin (JANUVIA) 100 MG tablet Take 1 tablet (100 mg total) by mouth daily. 30 tablet 2   No current facility-administered medications for this visit.    PHYSICAL EXAMINATION: ECOG PERFORMANCE  STATUS: 1 - Symptomatic but completely ambulatory  Vitals:   03/18/21 1222  BP: 119/66  Pulse: (!) 106  Resp: 14  Temp: 98.7 F (37.1 C)  SpO2: 100%   Filed Weights   03/18/21 1222  Weight: 188 lb 3.2 oz (85.4 kg)    GENERAL:alert, no distress and comfortable SKIN: skin color, texture, turgor are normal, no rashes or significant lesions EYES: normal, Conjunctiva are pink and non-injected, sclera clear OROPHARYNX:no exudate, no erythema and lips, buccal mucosa, and tongue normal  NECK: supple, thyroid normal size, non-tender, without nodularity LYMPH:  no palpable lymphadenopathy in the cervical, axillary or inguinal LUNGS: clear to auscultation and percussion with normal  breathing effort HEART: regular rate & rhythm and no murmurs and no lower extremity edema ABDOMEN:abdomen soft, non-tender and normal bowel sounds Musculoskeletal:no cyanosis of digits and no clubbing  NEURO: alert & oriented x 3 with fluent speech, no focal motor/sensory deficits  LABORATORY DATA:  I have reviewed the data as listed    Component Value Date/Time   NA 140 03/17/2021 0821   K 4.0 03/17/2021 0821   CL 103 03/17/2021 0821   CO2 27 03/17/2021 0821   GLUCOSE 99 03/17/2021 0821   BUN 25 (H) 03/17/2021 0821   CREATININE 1.82 (H) 03/17/2021 0821   CALCIUM 10.1 03/17/2021 0821   GFRNONAA 30 (L) 03/17/2021 0821    No results found for: SPEP, UPEP  Lab Results  Component Value Date   WBC 4.7 03/17/2021   NEUTROABS 3.4 03/17/2021   HGB 10.0 (L) 03/17/2021   HCT 28.0 (L) 03/17/2021   MCV 93.3 03/17/2021   PLT 165 03/17/2021      Chemistry      Component Value Date/Time   NA 140 03/17/2021 0821   K 4.0 03/17/2021 0821   CL 103 03/17/2021 0821   CO2 27 03/17/2021 0821   BUN 25 (H) 03/17/2021 0821   CREATININE 1.82 (H) 03/17/2021 0821      Component Value Date/Time   CALCIUM 10.1 03/17/2021 0821       RADIOGRAPHIC STUDIES: I have reviewed multiple imaging studies with the patient I have personally reviewed the radiological images as listed and agreed with the findings in the report. NM PET Image Restage (PS) Skull Base to Thigh  Result Date: 03/17/2021 CLINICAL DATA:  Subsequent treatment strategy for uterine/cervical carcinoma. EXAM: NUCLEAR MEDICINE PET SKULL BASE TO THIGH TECHNIQUE: 9.5 mCi F-18 FDG was injected intravenously. Full-ring PET imaging was performed from the skull base to thigh after the radiotracer. CT data was obtained and used for attenuation correction and anatomic localization. Fasting blood glucose: 91 mg/dl COMPARISON:  PET-CT 09/01/2020 FINDINGS: Mediastinal blood pool activity: SUV max 2.4 Liver activity: SUV max NA NECK: No hypermetabolic  lymph nodes in the neck. Incidental CT findings: none CHEST: No hypermetabolic mediastinal or hilar nodes. No suspicious pulmonary nodules on the CT scan. Incidental CT findings: none ABDOMEN/PELVIS: There is intense metabolic activity through the entirety of the colon and small bowel. Resolution of metabolic activity lower uterine segment and uterine body. No residual measurable metabolic activity in the uterus. Likewise resolution hypermetabolic pelvic lymph nodes. No measurable metabolic activity with iliac lymph nodes. No measurable adenopathy on CT portion exam. No evidence of metastatic disease in the liver. Periaortic retroperitoneum adenopathy. Incidental CT findings: none SKELETON: No skeletal metastasis Incidental CT findings: none IMPRESSION: 1. Mark positive response to therapy. 2. Resolution of metabolic activity within the uterine body. 3. Resolution of size and metabolic activity  of pelvic lymph nodes. 4. No evidence of new disease outside of the pelvis. Electronically Signed   By: Suzy Bouchard M.D.   On: 03/17/2021 17:05

## 2021-05-11 ENCOUNTER — Telehealth: Payer: Self-pay | Admitting: *Deleted

## 2021-05-11 NOTE — Telephone Encounter (Signed)
Spoke with the patient and canceled the appt for 9/2, explained that the office will call back next week to reschedule

## 2021-05-13 ENCOUNTER — Ambulatory Visit: Payer: Medicare PPO | Admitting: Gynecologic Oncology

## 2021-05-13 ENCOUNTER — Encounter: Payer: Self-pay | Admitting: Hematology and Oncology

## 2021-05-13 ENCOUNTER — Inpatient Hospital Stay: Payer: Medicare PPO

## 2021-05-13 ENCOUNTER — Telehealth: Payer: Self-pay

## 2021-05-13 ENCOUNTER — Inpatient Hospital Stay: Payer: Medicare PPO | Attending: Gynecologic Oncology | Admitting: Hematology and Oncology

## 2021-05-13 ENCOUNTER — Other Ambulatory Visit: Payer: Self-pay

## 2021-05-13 VITALS — BP 165/84 | HR 79 | Temp 97.8°F | Resp 18 | Ht 62.0 in | Wt 199.8 lb

## 2021-05-13 DIAGNOSIS — D649 Anemia, unspecified: Secondary | ICD-10-CM | POA: Diagnosis not present

## 2021-05-13 DIAGNOSIS — Z923 Personal history of irradiation: Secondary | ICD-10-CM | POA: Diagnosis not present

## 2021-05-13 DIAGNOSIS — M545 Low back pain, unspecified: Secondary | ICD-10-CM | POA: Insufficient documentation

## 2021-05-13 DIAGNOSIS — M5442 Lumbago with sciatica, left side: Secondary | ICD-10-CM | POA: Diagnosis not present

## 2021-05-13 DIAGNOSIS — Z9221 Personal history of antineoplastic chemotherapy: Secondary | ICD-10-CM | POA: Insufficient documentation

## 2021-05-13 DIAGNOSIS — C539 Malignant neoplasm of cervix uteri, unspecified: Secondary | ICD-10-CM

## 2021-05-13 DIAGNOSIS — D539 Nutritional anemia, unspecified: Secondary | ICD-10-CM | POA: Diagnosis not present

## 2021-05-13 DIAGNOSIS — I10 Essential (primary) hypertension: Secondary | ICD-10-CM | POA: Diagnosis not present

## 2021-05-13 DIAGNOSIS — Z79891 Long term (current) use of opiate analgesic: Secondary | ICD-10-CM | POA: Insufficient documentation

## 2021-05-13 DIAGNOSIS — M5441 Lumbago with sciatica, right side: Secondary | ICD-10-CM

## 2021-05-13 LAB — CBC WITH DIFFERENTIAL (CANCER CENTER ONLY)
Abs Immature Granulocytes: 0.01 10*3/uL (ref 0.00–0.07)
Basophils Absolute: 0 10*3/uL (ref 0.0–0.1)
Basophils Relative: 1 %
Eosinophils Absolute: 0.2 10*3/uL (ref 0.0–0.5)
Eosinophils Relative: 4 %
HCT: 32.8 % — ABNORMAL LOW (ref 36.0–46.0)
Hemoglobin: 11.5 g/dL — ABNORMAL LOW (ref 12.0–15.0)
Immature Granulocytes: 0 %
Lymphocytes Relative: 21 %
Lymphs Abs: 0.9 10*3/uL (ref 0.7–4.0)
MCH: 32.7 pg (ref 26.0–34.0)
MCHC: 35.1 g/dL (ref 30.0–36.0)
MCV: 93.2 fL (ref 80.0–100.0)
Monocytes Absolute: 0.3 10*3/uL (ref 0.1–1.0)
Monocytes Relative: 7 %
Neutro Abs: 3 10*3/uL (ref 1.7–7.7)
Neutrophils Relative %: 67 %
Platelet Count: 189 10*3/uL (ref 150–400)
RBC: 3.52 MIL/uL — ABNORMAL LOW (ref 3.87–5.11)
RDW: 12.7 % (ref 11.5–15.5)
WBC Count: 4.4 10*3/uL (ref 4.0–10.5)
nRBC: 0 % (ref 0.0–0.2)

## 2021-05-13 LAB — BASIC METABOLIC PANEL - CANCER CENTER ONLY
Anion gap: 11 (ref 5–15)
BUN: 14 mg/dL (ref 8–23)
CO2: 26 mmol/L (ref 22–32)
Calcium: 9.9 mg/dL (ref 8.9–10.3)
Chloride: 101 mmol/L (ref 98–111)
Creatinine: 0.98 mg/dL (ref 0.44–1.00)
GFR, Estimated: >60 mL/min (ref >60)
Glucose, Bld: 124 mg/dL — ABNORMAL HIGH (ref 70–99)
Potassium: 3.8 mmol/L (ref 3.5–5.1)
Sodium: 138 mmol/L (ref 135–145)

## 2021-05-13 LAB — IRON AND TIBC
Iron: 97 ug/dL (ref 41–142)
Saturation Ratios: 29 % (ref 21–57)
TIBC: 336 ug/dL (ref 236–444)
UIBC: 239 ug/dL (ref 120–384)

## 2021-05-13 LAB — VITAMIN B12: Vitamin B-12: 419 pg/mL (ref 180–914)

## 2021-05-13 LAB — FERRITIN: Ferritin: 136 ng/mL (ref 11–307)

## 2021-05-13 MED ORDER — PREDNISONE 20 MG PO TABS: 20.0000 mg | ORAL_TABLET | Freq: Every day | ORAL | 0 refills | Status: DC

## 2021-05-13 MED ORDER — SODIUM CHLORIDE 0.9% FLUSH
10.0000 mL | Freq: Once | INTRAVENOUS | Status: AC
  Administered 2021-05-13: 10 mL

## 2021-05-13 MED ORDER — HEPARIN SOD (PORK) LOCK FLUSH 100 UNIT/ML IV SOLN
500.0000 [IU] | Freq: Once | INTRAVENOUS | Status: AC
  Administered 2021-05-13: 500 [IU]

## 2021-05-13 MED ORDER — OXYCODONE HCL 10 MG PO TABS: 10.0000 mg | ORAL_TABLET | Freq: Four times a day (QID) | ORAL | 0 refills | Status: DC | PRN

## 2021-05-13 NOTE — Telephone Encounter (Signed)
-----   Message from Heath Lark, MD sent at 05/13/2021  2:56 PM EDT ----- Pls let her know B12 is better Continue oral B12

## 2021-05-13 NOTE — Assessment & Plan Note (Signed)
She has multifactorial anemia Her anemia is improving Recent recheck B12 level is adequate She will continue oral B12 supplement

## 2021-05-13 NOTE — Progress Notes (Signed)
[facility] OFFICE PROGRESS NOTE  Patient Care Team: Alvester Chou, NP as PCP - General (Nurse Practitioner) Jacqulyn Liner, RN as Oncology Nurse Navigator (Oncology)  ASSESSMENT & PLAN:  Cervical cancer [facility]) Last PET CT scan in July showed no evidence of disease I am concerned about her new lower back pain I recommend supportive care for now If her pain is not better in 2 weeks, I might have to repeat imaging study  Deficiency anemia She has multifactorial anemia Her anemia is improving Recent recheck B12 level is adequate She will continue oral B12 supplement  Pain in lower back She has severe pain radiating down her legs She denies recent trauma It is not clear whether this could be due to arthritis versus cancer I recommend a short course of prednisone and pain medicine I will see her back in 2 weeks If she still have persistent pain, I will order imaging study  Essential hypertension Blood pressure is elevated could be due to pain I recommend she start checking blood pressure twice daily and bring her blood pressure documentation in her next visit  No orders of the defined types were placed in this encounter.   All questions were answered. The patient knows to call the clinic with any problems, questions or concerns. The total time spent in the appointment was 20 minutes encounter with patients including review of chart and various tests results, discussions about plan of care and coordination of care plan   Heath Lark, MD 05/13/2021 3:13 PM  INTERVAL HISTORY: Please see below for problem oriented charting. she returns for follow-up on history of cervical cancer Over the last few weeks, she started to have lower back pain near the sacrum radiating down to both knees She denies recent trauma No recent vaginal bleeding She has not been checking her blood pressure on a regular basis She has gained weight because she is not able to exercise  REVIEW OF  SYSTEMS:   Constitutional: Denies fevers, chills or abnormal weight loss Eyes: Denies blurriness of vision Ears, nose, mouth, throat, and face: Denies mucositis or sore throat Respiratory: Denies cough, dyspnea or wheezes Cardiovascular: Denies palpitation, chest discomfort or lower extremity swelling Gastrointestinal:  Denies nausea, heartburn or change in bowel habits Skin: Denies abnormal skin rashes Lymphatics: Denies new lymphadenopathy or easy bruising Neurological:Denies numbness, tingling or new weaknesses Behavioral/Psych: Mood is stable, no new changes  All other systems were reviewed with the patient and are negative.  I have reviewed the past medical history, past surgical history, social history and family history with the patient and they are unchanged from previous note.  ALLERGIES:  has No Known Allergies.  MEDICATIONS:  Current Outpatient Medications  Medication Sig Dispense Refill   oxyCODONE 10 MG TABS Take 1 tablet (10 mg total) by mouth every 6 (six) hours as needed for severe pain. 60 tablet 0   predniSONE (DELTASONE) 20 MG tablet Take 1 tablet (20 mg total) by mouth daily with breakfast. 10 tablet 0   calcium carbonate (TUMS - DOSED IN MG ELEMENTAL CALCIUM) 500 MG chewable tablet Chew 1 tablet by mouth as needed for indigestion or heartburn.     ezetimibe (ZETIA) 10 MG tablet Take 10 mg by mouth daily.     lidocaine-prilocaine (EMLA) cream Apply to affected area once 30 g 3   magnesium oxide (MAG-OX) 400 (241.3 Mg) MG tablet Take 1 tablet (400 mg total) by mouth 2 (two) times daily. 60 tablet 11   rosuvastatin (  CRESTOR) 40 MG tablet Take 40 mg by mouth at bedtime.     sitaGLIPtin (JANUVIA) 100 MG tablet Take 1 tablet (100 mg total) by mouth daily. 30 tablet 2   vitamin B-12 (CYANOCOBALAMIN) 1000 MCG tablet Take 1,000 mcg by mouth daily.     No current facility-administered medications for this visit.    SUMMARY OF ONCOLOGIC HISTORY: Oncology History  Cervical  cancer ([place])  08/13/2020 Pathology Results   Cervical biopsy showed poorly differentiated carcinoma. 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   08/27/2020 Imaging   MRI pelvis Large cervical mass with pelvic adenopathy, at least T2B N1 disease. Question of involvement of posterior urinary bladder (T4) for which additional images have been requested. Patient will return for additional images to answer this question. (Thinner section axial T2 without fat sat and sagittal T2 weighted imaging also without fat saturation.)   Small field-of-view images show that the parametrial extension comes in very close proximity to the RIGHT ureter.   08/30/2020 Initial Diagnosis   Cervical cancer ([place])   08/30/2020 Cancer Staging   Staging form: Cervix Uteri, AJCC Version 9 - Clinical stage from 08/30/2020: Stage IIIC1 (cT2, cN1, cM0) - Signed by Heath Lark, MD on 08/30/2020   09/01/2020 PET scan   1. Large cervical mass extending into the endometrial canal to the level of the uterine fundus, associated with pelvic adenopathy to the level of the RIGHT common iliac chain. Please refer to MRI for further detail. 2. No signs of solid organ uptake or metastatic disease to the chest. 3. Subcutaneous nodule with marked increased metabolic activity, correlation with direct clinical inspection is suggested to exclude cutaneous neoplasm or subcutaneous nodule in this location. Complicated sebaceous cyst could also potentially have this appearance. 4. Uptake in the anal canal could likely physiologic. Given the intense nature of the uptake would suggest correlation with direct clinical inspection/examination. 5. Uptake in axillary lymph nodes with activity less than mediastinal blood pool likely small reactive lymph nodes, attention on follow-up. Morphologic features also with benign appearance.   09/16/2020 Procedure   Successful placement of a power injectable Port-A-Cath via the right  internal jugular vein. The catheter is ready for immediate use.     09/20/2020 - 10/25/2020 Chemotherapy   She received cisplatin chemo with concurrent radiation       09/22/2020 - 12/08/2020 Radiation Therapy   Radiation Treatment Dates: 09/22/2020 through 12/08/2020   Site: Pelvis Technique: IMRT Total Dose (Gy): 45/45 Dose per Fx (Gy): 1.8 Completed Fx: 25/25 Beam Energies: 6x   Site: pelvic boost Technique: IMRT Total Dose (Gy): 7.2/7.2 Dose per Fx (Gy): 1.8 Completed Fx: 4/4 Beam Energies: 6x   Site: Cervix boost Technique: HDR-brachytherapy Total Dose (Gy): 27.5/27.5 Dose per Fx (Gy): 5.5 Completed Fx: 5/5 Beam Energies: Ir-192     03/17/2021 PET scan   1. Mark positive response to therapy. 2. Resolution of metabolic activity within the uterine body. 3. Resolution of size and metabolic activity of pelvic lymph nodes. 4. No evidence of new disease outside of the pelvis.   Malignant neoplasm of cervix ([place])  09/01/2020 Initial Diagnosis   Malignant neoplasm of cervix ([place])   09/20/2020 - 10/25/2020 Chemotherapy   She received cisplatin chemo with concurrent radiation         PHYSICAL EXAMINATION: ECOG PERFORMANCE STATUS: 1 - Symptomatic but completely ambulatory  Vitals:   05/13/21 1255  BP: (!) 165/84  Pulse: 79  Resp: 18  Temp: 97.8 F (  36.6 C)  SpO2: 100%   Filed Weights   05/13/21 1255  Weight: 199 lb 12.8 oz (90.6 kg)    GENERAL:alert, no distress and comfortable NEURO: alert & oriented x 3 with fluent speech, no focal motor/sensory deficits  LABORATORY DATA:  I have reviewed the data as listed    Component Value Date/Time   NA 138 05/13/2021 1225   K 3.8 05/13/2021 1225   CL 101 05/13/2021 1225   CO2 26 05/13/2021 1225   GLUCOSE 124 (H) 05/13/2021 1225   BUN 14 05/13/2021 1225   CREATININE 0.98 05/13/2021 1225   CALCIUM 9.9 05/13/2021 1225   GFRNONAA >60 05/13/2021 1225    No results found for: SPEP, UPEP  Lab Results   Component Value Date   WBC 4.4 05/13/2021   NEUTROABS 3.0 05/13/2021   HGB 11.5 (L) 05/13/2021   HCT 32.8 (L) 05/13/2021   MCV 93.2 05/13/2021   PLT 189 05/13/2021      Chemistry      Component Value Date/Time   NA 138 05/13/2021 1225   K 3.8 05/13/2021 1225   CL 101 05/13/2021 1225   CO2 26 05/13/2021 1225   BUN 14 05/13/2021 1225   CREATININE 0.98 05/13/2021 1225      Component Value Date/Time   CALCIUM 9.9 05/13/2021 1225

## 2021-05-13 NOTE — Assessment & Plan Note (Signed)
Last PET CT scan in July showed no evidence of disease I am concerned about her new lower back pain I recommend supportive care for now If her pain is not better in 2 weeks, I might have to repeat imaging study

## 2021-05-13 NOTE — Telephone Encounter (Signed)
Called and given below message. She verbalized understanding. 

## 2021-05-13 NOTE — Assessment & Plan Note (Signed)
Blood pressure is elevated could be due to pain I recommend she start checking blood pressure twice daily and bring her blood pressure documentation in her next visit

## 2021-05-13 NOTE — Assessment & Plan Note (Signed)
She has severe pain radiating down her legs She denies recent trauma It is not clear whether this could be due to arthritis versus cancer I recommend a short course of prednisone and pain medicine I will see her back in 2 weeks If she still have persistent pain, I will order imaging study

## 2021-05-14 ENCOUNTER — Other Ambulatory Visit: Payer: Self-pay | Admitting: Hematology and Oncology

## 2021-05-17 ENCOUNTER — Encounter: Payer: Self-pay | Admitting: Hematology and Oncology

## 2021-05-27 ENCOUNTER — Telehealth: Payer: Self-pay | Admitting: *Deleted

## 2021-05-27 ENCOUNTER — Other Ambulatory Visit: Payer: Self-pay

## 2021-05-27 ENCOUNTER — Encounter (HOSPITAL_BASED_OUTPATIENT_CLINIC_OR_DEPARTMENT_OTHER): Payer: Self-pay | Admitting: Emergency Medicine

## 2021-05-27 ENCOUNTER — Emergency Department (HOSPITAL_BASED_OUTPATIENT_CLINIC_OR_DEPARTMENT_OTHER): Payer: Medicare PPO

## 2021-05-27 ENCOUNTER — Emergency Department (HOSPITAL_BASED_OUTPATIENT_CLINIC_OR_DEPARTMENT_OTHER)
Admission: EM | Admit: 2021-05-27 | Discharge: 2021-05-27 | Disposition: A | Discharge status: Home / Self Care | Payer: Medicare PPO | Attending: Emergency Medicine | Admitting: Emergency Medicine

## 2021-05-27 DIAGNOSIS — N390 Urinary tract infection, site not specified: Secondary | ICD-10-CM | POA: Diagnosis not present

## 2021-05-27 DIAGNOSIS — Z8541 Personal history of malignant neoplasm of cervix uteri: Secondary | ICD-10-CM | POA: Diagnosis not present

## 2021-05-27 DIAGNOSIS — D631 Anemia in chronic kidney disease: Secondary | ICD-10-CM | POA: Insufficient documentation

## 2021-05-27 DIAGNOSIS — N183 Chronic kidney disease, stage 3 unspecified: Secondary | ICD-10-CM | POA: Diagnosis not present

## 2021-05-27 DIAGNOSIS — R509 Fever, unspecified: Secondary | ICD-10-CM | POA: Diagnosis present

## 2021-05-27 DIAGNOSIS — U071 COVID-19: Secondary | ICD-10-CM | POA: Insufficient documentation

## 2021-05-27 DIAGNOSIS — B9689 Other specified bacterial agents as the cause of diseases classified elsewhere: Secondary | ICD-10-CM | POA: Insufficient documentation

## 2021-05-27 DIAGNOSIS — E1122 Type 2 diabetes mellitus with diabetic chronic kidney disease: Secondary | ICD-10-CM | POA: Diagnosis not present

## 2021-05-27 DIAGNOSIS — I129 Hypertensive chronic kidney disease with stage 1 through stage 4 chronic kidney disease, or unspecified chronic kidney disease: Secondary | ICD-10-CM | POA: Insufficient documentation

## 2021-05-27 LAB — COMPREHENSIVE METABOLIC PANEL
ALT: 9 U/L (ref 0–44)
AST: 12 U/L — ABNORMAL LOW (ref 15–41)
Albumin: 3.9 g/dL (ref 3.5–5.0)
Alkaline Phosphatase: 44 U/L (ref 38–126)
Anion gap: 12 (ref 5–15)
BUN: 13 mg/dL (ref 8–23)
CO2: 25 mmol/L (ref 22–32)
Calcium: 9.3 mg/dL (ref 8.9–10.3)
Chloride: 97 mmol/L — ABNORMAL LOW (ref 98–111)
Creatinine, Ser: 1.09 mg/dL — ABNORMAL HIGH (ref 0.44–1.00)
GFR, Estimated: 55 mL/min — ABNORMAL LOW (ref >60)
Glucose, Bld: 268 mg/dL — ABNORMAL HIGH (ref 70–99)
Potassium: 3.2 mmol/L — ABNORMAL LOW (ref 3.5–5.1)
Sodium: 134 mmol/L — ABNORMAL LOW (ref 135–145)
Total Bilirubin: 1.2 mg/dL (ref 0.3–1.2)
Total Protein: 6.8 g/dL (ref 6.5–8.1)

## 2021-05-27 LAB — CBC WITH DIFFERENTIAL/PLATELET
Abs Immature Granulocytes: 0.04 10*3/uL (ref 0.00–0.07)
Basophils Absolute: 0 10*3/uL (ref 0.0–0.1)
Basophils Relative: 0 %
Eosinophils Absolute: 0 10*3/uL (ref 0.0–0.5)
Eosinophils Relative: 0 %
HCT: 34.4 % — ABNORMAL LOW (ref 36.0–46.0)
Hemoglobin: 12 g/dL (ref 12.0–15.0)
Immature Granulocytes: 0 %
Lymphocytes Relative: 3 %
Lymphs Abs: 0.2 10*3/uL — ABNORMAL LOW (ref 0.7–4.0)
MCH: 32.8 pg (ref 26.0–34.0)
MCHC: 34.9 g/dL (ref 30.0–36.0)
MCV: 94 fL (ref 80.0–100.0)
Monocytes Absolute: 0.5 10*3/uL (ref 0.1–1.0)
Monocytes Relative: 6 %
Neutro Abs: 8.1 10*3/uL — ABNORMAL HIGH (ref 1.7–7.7)
Neutrophils Relative %: 91 %
Platelets: 115 10*3/uL — ABNORMAL LOW (ref 150–400)
RBC: 3.66 MIL/uL — ABNORMAL LOW (ref 3.87–5.11)
RDW: 13 % (ref 11.5–15.5)
WBC: 8.9 10*3/uL (ref 4.0–10.5)
nRBC: 0 % (ref 0.0–0.2)

## 2021-05-27 LAB — URINALYSIS, ROUTINE W REFLEX MICROSCOPIC
Bilirubin Urine: NEGATIVE
Glucose, UA: >1000 mg/dL — AB
Ketones, ur: 15 mg/dL — AB
Nitrite: POSITIVE — AB
Protein, ur: >300 mg/dL — AB
Specific Gravity, Urine: 1.021 (ref 1.005–1.030)
WBC, UA: >50 WBC/hpf — ABNORMAL HIGH (ref 0–5)
pH: 6 (ref 5.0–8.0)

## 2021-05-27 LAB — RESP PANEL BY RT-PCR (FLU A&B, COVID) ARPGX2
Influenza A by PCR: NEGATIVE
Influenza B by PCR: NEGATIVE
SARS Coronavirus 2 by RT PCR: POSITIVE — AB

## 2021-05-27 LAB — LACTIC ACID, PLASMA: Lactic Acid, Venous: 1.4 mmol/L (ref 0.5–1.9)

## 2021-05-27 MED ORDER — SODIUM CHLORIDE 0.9 % IV SOLN
2.0000 g | Freq: Once | INTRAVENOUS | Status: AC
  Administered 2021-05-27: 2 g via INTRAVENOUS
  Filled 2021-05-27: qty 20

## 2021-05-27 MED ORDER — CEPHALEXIN 500 MG PO CAPS: 500.0000 mg | ORAL_CAPSULE | Freq: Four times a day (QID) | ORAL | 0 refills | Status: DC

## 2021-05-27 MED ORDER — LACTATED RINGERS IV BOLUS (SEPSIS)
1000.0000 mL | Freq: Once | INTRAVENOUS | Status: AC
  Administered 2021-05-27: 1000 mL via INTRAVENOUS

## 2021-05-27 MED ORDER — HEPARIN SOD (PORK) LOCK FLUSH 100 UNIT/ML IV SOLN
500.0000 [IU] | Freq: Once | INTRAVENOUS | Status: AC
  Administered 2021-05-27: 500 [IU]
  Filled 2021-05-27: qty 5

## 2021-05-27 MED ORDER — ACETAMINOPHEN 325 MG PO TABS
650.0000 mg | ORAL_TABLET | Freq: Once | ORAL | Status: AC
  Administered 2021-05-27: 650 mg via ORAL
  Filled 2021-05-27: qty 2

## 2021-05-27 NOTE — ED Triage Notes (Signed)
Pt sent from UC r/t pain urination. Pt  states that problems started Tuesday. Pt denis N/V. Pt feels that she has a UTI

## 2021-05-27 NOTE — ED Notes (Signed)
Blood cultures drawn before antibiotics started 

## 2021-05-27 NOTE — ED Notes (Signed)
Pt ambulated to bathroom, gait steady

## 2021-05-27 NOTE — ED Provider Notes (Addendum)
[facility] EMERGENCY DEPT Provider Note   CSN: [id number] Arrival date & time: 05/27/21  1018     History Chief Complaint  Patient presents with   Dysuria    Misty Deleon is a 70 y.o. female.  Presenting to the emergency room with concern for dysuria, fever.  Patient states that over the past couple days has noted mild pain with urination.  Start taking Azo and this seemed to help.  Denies any other symptoms.  Went to urgent care today and was told that she had a fever and they recommended she go to ER for further evaluation.  Patient recently traveled to [place], visiting family.  Flew back yesterday.  Denies any cough or difficulty in breathing.  Has history of cervical cancer, currently in remission, not on chemotherapy.  Does have port.  HPI     Past Medical History:  Diagnosis Date   CKD (chronic kidney disease), stage III (HCC)    Deficiency anemia    B12 def.  treated with b12 injection and has received iron infusion's   GERD (gastroesophageal reflux disease)    History of radiation therapy 09/22/20-11/01/20   Cervix, IMRT    Dr. Gery Pray   History of radiation therapy 11/09/2020-30/30/2022   cervix, intracavitary brachytherapy   Dr Gery Pray   Hypercholesteremia    Hypertension    Hypomagnesemia    Malignant neoplasm cervix [facility]) oncologist--- dr gorsuch/  radiation oncology--- dr Sondra Come   dx 12/ 2021,  Stage IIIC-1,  with pelvic adeopathy;  concurrent chemo/ radiation;  chemo started 09-20-2020 and pt completed IMRT 11-01-2020 / scheduled for high dose brachytherapy to start 11-09-2020   PCOS (polycystic ovarian syndrome)    PMB (postmenopausal bleeding)    Type 2 diabetes mellitus ([place])    followed by pc   (11-04-2020 pt stated does not check blood sugar)   Wears contact lenses     Patient Active Problem List   Diagnosis Date Noted   Pain in lower back 05/13/2021   Low back ache 01/27/2021   Hypokalemia 10/22/2020   Essential  hypertension 10/01/2020   Hypomagnesemia 09/16/2020   Vitamin B12 deficiency 09/16/2020   Malignant neoplasm of cervix ([place]) 09/01/2020   Elevated serum creatinine 09/01/2020   Cervical cancer ([place]) 08/30/2020   Deficiency anemia 08/30/2020   Chronic kidney disease (CKD), stage III (moderate) ([place]) 08/30/2020   Diabetes mellitus without complication ([place])     Past Surgical History:  Procedure Laterality Date   CESAREAN SECTION  x2 last one [place]  09/16/2020   OPERATIVE ULTRASOUND N/A 11/09/2020   Procedure: OPERATIVE ULTRASOUND;  Surgeon: Gery Pray, MD;  Location: [facility];  Service: Urology;  Laterality: N/A;   OPERATIVE ULTRASOUND N/A 11/16/2020   Procedure: OPERATIVE ULTRASOUND;  Surgeon: Gery Pray, MD;  Location: [facility];  Service: Urology;  Laterality: N/A;   OPERATIVE ULTRASOUND N/A 11/25/2020   Procedure: OPERATIVE ULTRASOUND;  Surgeon: Gery Pray, MD;  Location: [facility];  Service: Urology;  Laterality: N/A;   OPERATIVE ULTRASOUND N/A 11/29/2020   Procedure: OPERATIVE ULTRASOUND;  Surgeon: Gery Pray, MD;  Location: [facility];  Service: Urology;  Laterality: N/A;   OPERATIVE ULTRASOUND N/A 12/08/2020   Procedure: OPERATIVE ULTRASOUND;  Surgeon: Gery Pray, MD;  Location: [facility];  Service: Urology;  Laterality: N/A;   TANDEM RING INSERTION N/A 11/09/2020   Procedure: TANDEM RING INSERTION;  Surgeon: Gery Pray,  MD;  Location: [facility];  Service: Urology;  Laterality: N/A;   TANDEM RING INSERTION N/A 11/16/2020   Procedure: TANDEM RING INSERTION;  Surgeon: Gery Pray, MD;  Location: [facility];  Service: Urology;  Laterality: N/A;   TANDEM RING INSERTION N/A 11/25/2020   Procedure: TANDEM RING INSERTION;  Surgeon: Gery Pray, MD;  Location: [facility];  Service: Urology;  Laterality: N/A;    TANDEM RING INSERTION N/A 11/29/2020   Procedure: TANDEM RING INSERTION;  Surgeon: Gery Pray, MD;  Location: [facility];  Service: Urology;  Laterality: N/A;   TANDEM RING INSERTION N/A 12/08/2020   Procedure: TANDEM RING INSERTION;  Surgeon: Gery Pray, MD;  Location: [facility];  Service: Urology;  Laterality: N/A;   WISDOM TOOTH EXTRACTION  1980s     OB History     Gravida  2   Para  2   Term      Preterm      AB      Living         SAB      IAB      Ectopic      Multiple      Live Births              Family History  Problem Relation Age of Onset   Diabetes Father    Brain cancer Son    Breast cancer Neg Hx    Colon cancer Neg Hx    Ovarian cancer Neg Hx    Endometrial cancer Neg Hx    Prostate cancer Neg Hx    Pancreatic cancer Neg Hx     Social History   Tobacco Use   Smoking status: Never   Smokeless tobacco: Never  Vaping Use   Vaping Use: Never used  Substance Use Topics   Alcohol use: Never   Drug use: Never    Home Medications Prior to Admission medications   Medication Sig Start Date End Date Taking? Authorizing Provider  cephALEXin (KEFLEX) 500 MG capsule Take 1 capsule (500 mg total) by mouth 4 (four) times daily. 05/27/21  Yes Ahlaya Ende, Ellwood Dense, MD  calcium carbonate (TUMS - DOSED IN MG ELEMENTAL CALCIUM) 500 MG chewable tablet Chew 1 tablet by mouth as needed for indigestion or heartburn.    [provider]  ezetimibe (ZETIA) 10 MG tablet Take 10 mg by mouth daily.    [provider]  lidocaine-prilocaine (EMLA) cream Apply to affected area once 10/29/20   Heath Lark, MD  magnesium oxide (MAG-OX) 400 (241.3 Mg) MG tablet Take 1 tablet (400 mg total) by mouth 2 (two) times daily. 10/01/20   Heath Lark, MD  oxyCODONE 10 MG TABS Take 1 tablet (10 mg total) by mouth every 6 (six) hours as needed for severe pain. 05/13/21   Heath Lark, MD  predniSONE (DELTASONE) 20 MG tablet Take 1  tablet (20 mg total) by mouth daily with breakfast. 05/13/21   Heath Lark, MD  rosuvastatin (CRESTOR) 40 MG tablet Take 40 mg by mouth at bedtime.    [provider]  sitaGLIPtin (JANUVIA) 100 MG tablet Take 1 tablet (100 mg total) by mouth daily. 02/01/21 03/03/21  Heath Lark, MD  vitamin B-12 (CYANOCOBALAMIN) 1000 MCG tablet Take 1,000 mcg by mouth daily.    [provider]    Allergies    Patient has no known allergies.  Review of Systems   Review of Systems  Constitutional:  Positive for fever. Negative for chills.  HENT:  Negative for ear pain and sore throat.   Eyes:  Negative for pain and visual disturbance.  Respiratory:  Negative for cough and shortness of breath.   Cardiovascular:  Negative for chest pain and palpitations.  Gastrointestinal:  Negative for abdominal pain and vomiting.  Genitourinary:  Positive for dysuria. Negative for hematuria.  Musculoskeletal:  Negative for arthralgias and back pain.  Skin:  Negative for color change and rash.  Neurological:  Negative for seizures, syncope and light-headedness.  All other systems reviewed and are negative.  Physical Exam Updated Vital Signs BP 110/65   Pulse 91   Temp (!) 102 F (38.9 C)   Resp (!) 22   Ht '5\' 2"'$  (1.575 m)   Wt 88.5 kg   SpO2 98%   BMI 35.67 kg/m   Physical Exam Vitals and nursing note reviewed.  Constitutional:      General: She is not in acute distress.    Appearance: She is well-developed.  HENT:     Head: Normocephalic and atraumatic.  Eyes:     Conjunctiva/sclera: Conjunctivae normal.  Cardiovascular:     Rate and Rhythm: Normal rate and regular rhythm.     Heart sounds: No murmur heard. Pulmonary:     Effort: Pulmonary effort is normal. No respiratory distress.     Breath sounds: Normal breath sounds.  Chest:     Comments: Port site is clean dry and intact Abdominal:     Palpations: Abdomen is soft.     Tenderness: There is no abdominal tenderness.   Musculoskeletal:     Cervical back: Neck supple.  Skin:    General: Skin is warm and dry.  Neurological:     Mental Status: She is alert.    ED Results / Procedures / Treatments   Labs (all labs ordered are listed, but only abnormal results are displayed) Labs Reviewed  RESP PANEL BY RT-PCR (FLU A&B, COVID) ARPGX2 - Abnormal; Notable for the following components:      Result Value   SARS Coronavirus 2 by RT PCR POSITIVE (*)    All other components within normal limits  COMPREHENSIVE METABOLIC PANEL - Abnormal; Notable for the following components:   Sodium 134 (*)    Potassium 3.2 (*)    Chloride 97 (*)    Glucose, Bld 268 (*)    Creatinine, Ser 1.09 (*)    AST 12 (*)    GFR, Estimated 55 (*)    All other components within normal limits  CBC WITH DIFFERENTIAL/PLATELET - Abnormal; Notable for the following components:   RBC 3.66 (*)    HCT 34.4 (*)    Platelets 115 (*)    Neutro Abs 8.1 (*)    Lymphs Abs 0.2 (*)    All other components within normal limits  URINALYSIS, ROUTINE W REFLEX MICROSCOPIC - Abnormal; Notable for the following components:   APPearance HAZY (*)    Glucose, UA >1,000 (*)    Hgb urine dipstick LARGE (*)    Ketones, ur 15 (*)    Protein, ur >300 (*)    Nitrite POSITIVE (*)    Leukocytes,Ua LARGE (*)    WBC, UA >50 (*)    Bacteria, UA RARE (*)    Non Squamous Epithelial 0-5 (*)    All other components within normal limits  URINE CULTURE  CULTURE, BLOOD (ROUTINE X 2)  CULTURE, BLOOD (ROUTINE X 2)  LACTIC ACID, PLASMA  LACTIC ACID, PLASMA  EKG None  Radiology DG Chest Port 1 View  Result Date: 05/27/2021 CLINICAL DATA:  Questionable sepsis EXAM: PORTABLE CHEST 1 VIEW COMPARISON:  PET-CT 03/17/2021 FINDINGS: A right chest wall port is in place with the tip terminating in the mid SVC. The heart is at the upper limits of normal for size. Calcified left hilar lymph nodes are noted the mediastinal contours are otherwise within normal limits.  There is no focal consolidation or pulmonary edema. There is no pleural effusion or pneumothorax. There is a small calcified granuloma in the left upper lobe as seen on the prior PET-CT. There is no acute osseous abnormality. IMPRESSION: No radiographic evidence of acute cardiopulmonary process. Electronically Signed   By: Valetta Mole M.D.   On: 05/27/2021 11:19    Procedures Procedures   Medications Ordered in ED Medications  heparin lock flush 100 unit/mL (has no administration in time range)  lactated ringers bolus 1,000 mL (1,000 mLs Intravenous New Bag/Given 05/27/21 1132)  cefTRIAXone (ROCEPHIN) 2 g in sodium chloride 0.9 % 100 mL IVPB (0 g Intravenous Stopped 05/27/21 1246)  acetaminophen (TYLENOL) tablet 650 mg (650 mg Oral Given 05/27/21 1134)    ED Course  I have reviewed the triage vital signs and the nursing notes.  Pertinent labs & imaging results that were available during my care of the patient were reviewed by me and considered in my medical decision making (see chart for details).    MDM Rules/Calculators/A&P                           70 year old lady with history of cervical cancer in remission not on chemo presenting to ER with concern for dysuria.  Noted to have fever and tachycardia.  She looks remarkably well-appearing in no distress.  Has a soft abdomen.  No associated nausea or vomiting.  Check broad infectious work-up including blood cultures.  UA concerning for UTI.  CBC within normal limits, no leukocytosis.  Chest x-ray negative for pneumonia.  COVID test is positive.  Suspect fever is either from COVID or UTI.  Given patient has port, offered admission and discussed the risks and benefits of admission versus discharge in detail with patient and husband at bedside.  Patient has elected to be discharged.  Did review very strict return precautions should she have repeat fever, or worsening systemic symptoms.  If blood cultures are positive, will need to call back and  likely admission.  Regarding COVID positive, offered Paxlovid however patient declined. Will give rx for abx for uti. Dc home with husband. Instructed to f/u with PCP this coming week.     After the discussed management above, the patient was determined to be safe for discharge.  The patient was in agreement with this plan and all questions regarding their care were answered.  ED return precautions were discussed and the patient will return to the ED with any significant worsening of condition.  GRISEL TRAVASSOS was evaluated in Emergency Department on 05/27/2021 for the symptoms described in the history of present illness. She was evaluated in the context of the global COVID-19 pandemic, which necessitated consideration that the patient might be at risk for infection with the SARS-CoV-2 virus that causes COVID-19. Institutional protocols and algorithms that pertain to the evaluation of patients at risk for COVID-19 are in a state of rapid change based on information released by regulatory bodies including the CDC and federal and state organizations. These policies and algorithms  were followed during the patient's care in the ED.    Final Clinical Impression(s) / ED Diagnoses Final diagnoses:  [id number]  Urinary tract infection without hematuria, site unspecified    Rx / DC Orders ED Discharge Orders          Ordered    cephALEXin (KEFLEX) 500 MG capsule  4 times daily        05/27/21 1355             Lucrezia Starch, MD 05/27/21 1355    Lucrezia Starch, MD 05/27/21 1357

## 2021-05-27 NOTE — Telephone Encounter (Signed)
Returned the patient's call and canceled all her appts for Monday. [place]; patient will call back to reschedule

## 2021-05-27 NOTE — Discharge Instructions (Signed)
Take antibiotic as prescribed.  If you develop any recurrent fever, difficulty breathing, nausea or vomiting or other new concerning symptom, return to ER for reassessment.  Please follow-up with either your primary care doctor or oncologist within the next couple days.

## 2021-05-28 ENCOUNTER — Telehealth (HOSPITAL_BASED_OUTPATIENT_CLINIC_OR_DEPARTMENT_OTHER): Payer: Self-pay | Admitting: Emergency Medicine

## 2021-05-28 LAB — BLOOD CULTURE ID PANEL (REFLEXED) - BCID2

## 2021-05-28 MED ORDER — CEPHALEXIN 500 MG PO CAPS: 500.0000 mg | ORAL_CAPSULE | Freq: Four times a day (QID) | ORAL | 0 refills | Status: DC

## 2021-05-28 NOTE — Telephone Encounter (Signed)
70 year old lady seen yesterday for fever and dysuria.  Found to have UTI and COVID.  Yesterday had offered admission due to age, fever, presence of port.  Patient however declined admission and elected to go home.  Had received dose of Rocephin.  Given Rx for Keflex.  Blood cultures today came back positive for E. coli.  E. coli also in urine.  Suspect urine as source of bacteremia.  Patient had been recommended to go back to hospital for admission due to positive blood cultures.  She called our ER to get more detailed information.  I discussed the case with our ER pharmacist Salome Arnt.  She reviewed the case with ID pharmacist who advised,"If she is feeling okay and unwilling to come back then I think it would be reasonable for her to continue on the keflex outpatient for now. They did utilize the higher doses but she only got a 5d course, so at a minimum we would need 7d. We really need to keep her on our radar and re-visit when the sensitivities result. For now - she can continue on the keflex but if starts to feel bad or worse - then she would at that point need to come in. "  I also contacted the on-call oncologist given her medical history, Dr. Alen Blew who states that he would defer to ID or ID pharmacy regarding recommendations about IV versus oral antibiotics.  I called the patient back this afternoon.  Since leaving our emergency room yesterday she has not had any fevers and has no symptoms today.  Discussed these recommendations from the ID pharmacist, I recommended to patient that she be admitted as the safest and most conservative option.  Discussed risks and benefits of admission versus continued outpatient therapy.  Patient still would like to continue outpatient therapy.  I stressed very strict return precautions.  Will send in additional antibiotics to her pharmacy so that she can complete a full 7 days.  Additionally recommended that she follow-up both with her oncologist and primary  care.  As she is COVID-positive this likely will have to be virtual visits.

## 2021-05-28 NOTE — Telephone Encounter (Addendum)
Received positive blood culture results. Consulted with Dr. Wyvonnia Dusky who advised pt return to ED at [facility] or [facility] for re-evaluation and possible admission. Will have day shift follow up with pt.

## 2021-05-29 LAB — URINE CULTURE: Culture: >=100000 — AB

## 2021-05-30 ENCOUNTER — Inpatient Hospital Stay: Payer: Medicare PPO

## 2021-05-30 ENCOUNTER — Inpatient Hospital Stay: Payer: Medicare PPO | Admitting: Hematology and Oncology

## 2021-05-30 ENCOUNTER — Ambulatory Visit: Payer: Medicare PPO | Admitting: Gynecologic Oncology

## 2021-05-30 ENCOUNTER — Telehealth: Payer: Self-pay

## 2021-05-30 LAB — CULTURE, BLOOD (ROUTINE X 2): Special Requests: ADEQUATE

## 2021-05-30 NOTE — Telephone Encounter (Signed)
Post ED Visit - Positive Culture Follow-up  Culture report reviewed by antimicrobial stewardship pharmacist: [place] Team [x]  Joetta Manners, Pharm.D, BCCCP []  Heide Guile, Pharm.D., BCPS AQ-ID []  Parks Neptune, Pharm.D., BCPS []  Alycia Rossetti, Pharm.D., BCPS []  [place], Pharm.D., BCPS, AAHIVP []  Legrand Como, Pharm.D., BCPS, AAHIVP []  Salome Arnt, PharmD, BCPS []  Johnnette Gourd, PharmD, BCPS []  Hughes Better, PharmD, BCPS []  Leeroy Cha, PharmD []  Laqueta Linden, PharmD, BCPS []  Albertina Parr, PharmD  [place] Team []  Leodis Sias, PharmD []  Lindell Spar, PharmD []  Royetta Asal, PharmD []  Graylin Shiver, Rph []  Rema Fendt) Glennon Mac, PharmD []  Arlyn Dunning, PharmD []  Netta Cedars, PharmD []  Dia Sitter, PharmD []  Leone Haven, PharmD []  Gretta Arab, PharmD []  Theodis Shove, PharmD []  Peggyann Juba, PharmD []  Reuel Boom, PharmD   Positive urine culture Treated with Cephalexin, organism sensitive to the same and no further patient follow-up is required at this time.  Glennon Hamilton 05/30/2021, 10:24 AM

## 2021-05-31 ENCOUNTER — Telehealth: Payer: Self-pay | Admitting: *Deleted

## 2021-05-31 NOTE — Telephone Encounter (Signed)
Post ED Visit - Positive Culture Follow-up  Culture report reviewed by antimicrobial stewardship pharmacist: [place] Team []  Elenor Quinones, Pharm.D. []  Heide Guile, Pharm.D., BCPS AQ-ID []  Parks Neptune, Pharm.D., BCPS []  Alycia Rossetti, Pharm.D., BCPS []  [place], [place].D., BCPS, AAHIVP []  Legrand Como, Pharm.D., BCPS, AAHIVP []  Salome Arnt, PharmD, BCPS []  Johnnette Gourd, PharmD, BCPS []  Hughes Better, PharmD, BCPS []  Leeroy Cha, PharmD []  Laqueta Linden, PharmD, BCPS []  Albertina Parr, PharmD  [place] Team []  Leodis Sias, PharmD []  Lindell Spar, PharmD []  Royetta Asal, PharmD []  Graylin Shiver, Rph []  Rema Fendt) Glennon Mac, PharmD []  Arlyn Dunning, PharmD []  Netta Cedars, PharmD []  Dia Sitter, PharmD []  Leone Haven, PharmD []  Gretta Arab, PharmD []  Theodis Shove, PharmD []  Peggyann Juba, PharmD []  Reuel Boom, PharmD   Positive blood culture Treated with Cephalexin, organism sensitive to the same and no further patient follow-up is required at this time.Calvert Cantor, MD  Harlon Flor Talley 05/31/2021, 9:11 AM

## 2021-05-31 NOTE — Progress Notes (Signed)
ED Antimicrobial Stewardship Positive Culture Follow Up   Misty Deleon is an 70 y.o. female who presented to [facility] on 05/27/2021 with a chief complaint of  Chief Complaint  Patient presents with   Dysuria    Recent Results (from the past 720 hour(s))  Urine Culture     Status: Abnormal   Collection Time: 05/27/21 10:36 AM   Specimen: In/Out Cath Urine  Result Value Ref Range Status   Specimen Description   Final    IN/OUT CATH URINE Performed at [facility], [address] [place], [place] [address]    Special Requests   Final    NONE Performed at [facility], [address], [place], [place] [address]    Culture >=100,000 COLONIES/mL ESCHERICHIA COLI (A)  Final   Report Status 05/29/2021 FINAL  Final   Organism ID, Bacteria ESCHERICHIA COLI (A)  Final      Susceptibility   Escherichia coli - MIC*    AMPICILLIN <=2 SENSITIVE Sensitive     CEFAZOLIN <=4 SENSITIVE Sensitive     CEFEPIME <=0.12 SENSITIVE Sensitive     CEFTRIAXONE <=0.25 SENSITIVE Sensitive     CIPROFLOXACIN <=0.25 SENSITIVE Sensitive     GENTAMICIN <=1 SENSITIVE Sensitive     IMIPENEM <=0.25 SENSITIVE Sensitive     NITROFURANTOIN <=16 SENSITIVE Sensitive     TRIMETH/SULFA <=20 SENSITIVE Sensitive     AMPICILLIN/SULBACTAM <=2 SENSITIVE Sensitive     PIP/TAZO <=4 SENSITIVE Sensitive     * >=100,000 COLONIES/mL ESCHERICHIA COLI  Blood Culture (routine x 2)     Status: None (Preliminary result)   Collection Time: 05/27/21 11:20 AM   Specimen: Left Antecubital; Blood  Result Value Ref Range Status   Specimen Description   Final    LEFT ANTECUBITAL Performed at [facility], [address], [place], [place] [address]    Special Requests   Final    Blood Culture adequate volume Performed at [facility], [address] [place], [place] [address]    Culture   Final    NO GROWTH 3 DAYS Performed at [facility], [place] [address] [place], [place] [address]    Report Status PENDING  Incomplete  Blood Culture (routine x 2)     Status: Abnormal   Collection Time: 05/27/21 11:23 AM   Specimen: BLOOD  Result Value Ref Range Status   Specimen Description BLOOD RIGHT ANTECUBITAL  Final   Special Requests   Final    BOTTLES DRAWN AEROBIC AND ANAEROBIC Blood Culture adequate volume   Culture  Setup Time   Final    GRAM NEGATIVE RODS IN BOTH AEROBIC AND ANAEROBIC BOTTLES CRITICAL RESULT CALLED TO, READ BACK BY AND VERIFIED WITH: K NEAL,RN@0529  05/28/21 [place] Performed at [facility], [place] [address], [place], [place] [address]    Culture ESCHERICHIA COLI (A)  Final   Report Status 05/30/2021 FINAL  Final   Organism ID, Bacteria ESCHERICHIA COLI  Final      Susceptibility   Escherichia coli - MIC*    AMPICILLIN <=2 SENSITIVE Sensitive     CEFAZOLIN <=4 SENSITIVE Sensitive     CEFEPIME <=0.12 SENSITIVE Sensitive     CEFTAZIDIME <=1 SENSITIVE Sensitive     CEFTRIAXONE <=0.25 SENSITIVE Sensitive     CIPROFLOXACIN <=0.25 SENSITIVE Sensitive     GENTAMICIN <=1 SENSITIVE Sensitive     IMIPENEM <=0.25 SENSITIVE Sensitive     TRIMETH/SULFA <=20 SENSITIVE Sensitive  AMPICILLIN/SULBACTAM <=2 SENSITIVE Sensitive     PIP/TAZO <=4 SENSITIVE Sensitive     * ESCHERICHIA COLI  Blood Culture ID Panel (Reflexed)     Status: Abnormal   Collection Time: 05/27/21 11:23 AM  Result Value Ref Range Status   Enterococcus faecalis NOT DETECTED NOT DETECTED Final   Enterococcus Faecium NOT DETECTED NOT DETECTED Final   Listeria monocytogenes NOT DETECTED NOT DETECTED Final   Staphylococcus species NOT DETECTED NOT DETECTED Final   Staphylococcus aureus (BCID) NOT DETECTED NOT DETECTED Final   Staphylococcus epidermidis NOT DETECTED NOT DETECTED Final   Staphylococcus lugdunensis NOT DETECTED NOT DETECTED Final   Streptococcus species NOT DETECTED NOT DETECTED Final   Streptococcus agalactiae NOT DETECTED NOT  DETECTED Final   Streptococcus pneumoniae NOT DETECTED NOT DETECTED Final   Streptococcus pyogenes NOT DETECTED NOT DETECTED Final   A.calcoaceticus-baumannii NOT DETECTED NOT DETECTED Final   Bacteroides fragilis NOT DETECTED NOT DETECTED Final   Enterobacterales DETECTED (A) NOT DETECTED Final    Comment: Enterobacterales represent a large order of gram negative bacteria, not a single organism. CRITICAL RESULT CALLED TO, READ BACK BY AND VERIFIED WITH: K NEAL,RN@0530  05/28/21 MKELLY    Enterobacter cloacae complex NOT DETECTED NOT DETECTED Final   Escherichia coli DETECTED (A) NOT DETECTED Final    Comment: CRITICAL RESULT CALLED TO, READ BACK BY AND VERIFIED WITH: K NEAL,RN@0529  05/28/21 MKELLY    Klebsiella aerogenes NOT DETECTED NOT DETECTED Final   Klebsiella oxytoca NOT DETECTED NOT DETECTED Final   Klebsiella pneumoniae NOT DETECTED NOT DETECTED Final   Proteus species NOT DETECTED NOT DETECTED Final   Salmonella species NOT DETECTED NOT DETECTED Final   Serratia marcescens NOT DETECTED NOT DETECTED Final   Haemophilus influenzae NOT DETECTED NOT DETECTED Final   Neisseria meningitidis NOT DETECTED NOT DETECTED Final   Pseudomonas aeruginosa NOT DETECTED NOT DETECTED Final   Stenotrophomonas maltophilia NOT DETECTED NOT DETECTED Final   Candida albicans NOT DETECTED NOT DETECTED Final   Candida auris NOT DETECTED NOT DETECTED Final   Candida glabrata NOT DETECTED NOT DETECTED Final   Candida krusei NOT DETECTED NOT DETECTED Final   Candida parapsilosis NOT DETECTED NOT DETECTED Final   Candida tropicalis NOT DETECTED NOT DETECTED Final   Cryptococcus neoformans/gattii NOT DETECTED NOT DETECTED Final   CTX-M ESBL NOT DETECTED NOT DETECTED Final   Carbapenem resistance IMP NOT DETECTED NOT DETECTED Final   Carbapenem resistance KPC NOT DETECTED NOT DETECTED Final   Carbapenem resistance NDM NOT DETECTED NOT DETECTED Final   Carbapenem resist OXA 48 LIKE NOT DETECTED NOT  DETECTED Final   Carbapenem resistance VIM NOT DETECTED NOT DETECTED Final    Comment: Performed at [facility], [address], [place], [place] [address]  Resp Panel by RT-PCR (Flu A&B, Covid) Peripheral     Status: Abnormal   Collection Time: 05/27/21 11:30 AM   Specimen: Peripheral; Nasopharyngeal(NP) swabs in vial transport medium  Result Value Ref Range Status   SARS Coronavirus 2 by RT PCR POSITIVE (A) NEGATIVE Final    Comment: RESULT CALLED TO, READ BACK BY AND VERIFIED WITH: Mable Fill rn 05/27/2021 src 1212 (NOTE) SARS-CoV-2 target nucleic acids are DETECTED.  The SARS-CoV-2 RNA is generally detectable in upper respiratory specimens during the acute phase of infection. Positive results are indicative of the presence of the identified virus, but do not rule out bacterial infection or co-infection with other pathogens not detected by the test. Clinical correlation with patient history and other  diagnostic information is necessary to determine patient infection status. The expected result is Negative.  Fact Sheet for Patients: EntrepreneurPulse.com.au  Fact Sheet for Healthcare Providers: IncredibleEmployment.be  This test is not yet approved or cleared by the [place] FDA and  has been authorized for detection and/or diagnosis of SARS-CoV-2 by FDA under an Emergency Use Authorization (EUA).  This EUA will remain in effect (meaning this test can be  used) for the duration of  the COVID-19 declaration under Section 564(b)(1) of the Act, 21 U.S.C. section 360bbb-3(b)(1), unless the authorization is terminated or revoked sooner.     Influenza A by PCR NEGATIVE NEGATIVE Final   Influenza B by PCR NEGATIVE NEGATIVE Final    Comment: (NOTE) The Xpert Xpress SARS-CoV-2/FLU/RSV plus assay is intended as an aid in the diagnosis of influenza from Nasopharyngeal swab specimens and should not be used as a sole basis for treatment.  Nasal washings and aspirates are unacceptable for Xpert Xpress SARS-CoV-2/FLU/RSV testing.  Fact Sheet for Patients: EntrepreneurPulse.com.au  Fact Sheet for Healthcare Providers: IncredibleEmployment.be  This test is not yet approved or cleared by the [place] FDA and has been authorized for detection and/or diagnosis of SARS-CoV-2 by FDA under an Emergency Use Authorization (EUA). This EUA will remain in effect (meaning this test can be used) for the duration of the COVID-19 declaration under Section 564(b)(1) of the Act, 21 U.S.C. section 360bbb-3(b)(1), unless the authorization is terminated or revoked.  Performed at [facility], [address] [place], [place] [address]    Recommendations: Patient was followed up by Dr. Roslynn Amble on 9/17 in regards to inpatient therapy for [facility] bacteremia. Patient elected to do outpatient therapy. Follow up with patient and ensure if no worsening of symptoms, endorsing any fevers, or other signs of systemic infection. If so, please recommend returning to ED for IV antibiotics.  ED Provider: Calvert Cantor, MD  Joetta Manners, PharmD, [facility] Emergency Medicine Clinical Pharmacist ED RPh Phone: [place]: [phone]

## 2021-06-01 LAB — CULTURE, BLOOD (ROUTINE X 2)
Culture: NO GROWTH
Special Requests: ADEQUATE

## 2021-06-06 ENCOUNTER — Other Ambulatory Visit: Payer: Self-pay | Admitting: Hematology and Oncology

## 2021-06-06 ENCOUNTER — Telehealth: Payer: Self-pay

## 2021-06-06 NOTE — Telephone Encounter (Signed)
Future refills with PCP

## 2021-06-06 NOTE — Telephone Encounter (Signed)
Called and left below message. Ask her to call the office back for questions. 

## 2021-06-06 NOTE — Telephone Encounter (Signed)
-----   Message from Heath Lark, MD sent at 06/06/2021  4:00 PM EDT ----- Pharmacy requested refill of Januvia I sent a refill Please remind her future refill with PCP since I do not manage diabetes

## 2021-06-10 ENCOUNTER — Encounter: Payer: Self-pay | Admitting: Radiology

## 2021-06-10 ENCOUNTER — Telehealth: Payer: Self-pay

## 2021-06-10 NOTE — Telephone Encounter (Signed)
Returned her call. She tested positive for covid and UTI on 9/16 at ER. She is feeling better and wanting to reschedule appts. Appts rescheduled on 9/13 and she is aware of time/date.

## 2021-06-16 ENCOUNTER — Ambulatory Visit: Payer: Medicare PPO | Admitting: Radiation Oncology

## 2021-06-22 NOTE — Progress Notes (Signed)
Radiation Oncology         [phone] ________________________________  Name: Misty Deleon MRN: [id number]  Date: 06/23/2021  DOB: 1950/09/22  Follow-Up Visit Note  CC: Alvester Chou, NP  Alvester Chou, NP    ICD-10-CM   1. Malignant neoplasm of overlapping sites of cervix [facility])  C53.8       Diagnosis: Clincal stage IIIC-1 (cT2, cN1, cM0) poorly differentiated cervical cancer  Interval Since Last Radiation: 6 months and 2 weeks  Radiation Treatment Dates: 09/22/2020 through 12/08/2020   Site: Pelvis Technique: IMRT Total Dose (Gy): 45/45 Dose per Fx (Gy): 1.8 Completed Fx: 25/25 Beam Energies: 6x   Site: pelvic boost Technique: IMRT Total Dose (Gy): 7.2/7.2 Dose per Fx (Gy): 1.8 Completed Fx: 4/4 Beam Energies: 6x   Site: Cervix boost Technique: HDR-brachytherapy Total Dose (Gy): 27.5/27.5 Dose per Fx (Gy): 5.5 Completed Fx: 5/5 Beam Energies: Ir-192  Narrative:  The patient returns today for routine follow-up, she was last seen here for follow-up on 01/13/21.     Since her last visit, the patient followed yp with Dr. Alvy Bimler on 03/18/21. During which time, the patient was noted to have lost 10 pounds in the interval, and reported that she is not drinking much water and has mild trace neuropathy (without bother). The patient again followed up Dr. Alvy Bimler on 05/13/21 where she reported a several week history lower back pain near the sacrum which radiates down to both knees. Due to this pain, the patient reported gaining weight due to sacral pain inhibiting her ability to exercise.     In recent history, the patient presented to the ED on 05/27/21 with dysuria and fever. During evaluation the patient tested positive for COVID. The patient was offered paxlovid though declined and opted to discharge. Chest x-ray performed during ED course demonstrated no radiographic evidence of acute cardiopulmonary process.            Pertinent imaging since the patient was last seen  includes PET on 03/17/21 which demonstrated a marked positive response to therapy and resolution of metabolic activity within the uterine body and pelvic lymph nodes. No evidence of new disease was seen otherwise outside of the pelvis.      She denies any abdominal bloating, pelvic pain, vaginal bleeding, rectal bleeding or hematuria        Allergies:  has No Known Allergies.  Meds: Current Outpatient Medications  Medication Sig Dispense Refill   calcium carbonate (TUMS - DOSED IN MG ELEMENTAL CALCIUM) 500 MG chewable tablet Chew 1 tablet by mouth as needed for indigestion or heartburn.     ezetimibe (ZETIA) 10 MG tablet Take 10 mg by mouth daily.     JANUVIA 100 MG tablet TAKE 1 TABLET(100 MG) BY MOUTH DAILY 30 tablet 0   lidocaine-prilocaine (EMLA) cream Apply to affected area once 30 g 3   magnesium oxide (MAG-OX) 400 (241.3 Mg) MG tablet Take 1 tablet (400 mg total) by mouth 2 (two) times daily. 60 tablet 11   rosuvastatin (CRESTOR) 40 MG tablet Take 40 mg by mouth at bedtime.     vitamin B-12 (CYANOCOBALAMIN) 1000 MCG tablet Take 1,000 mcg by mouth daily.     No current facility-administered medications for this encounter.    Physical Findings: The patient is in no acute distress. Patient is alert and oriented.  height is 5\' 2"  (1.575 m) and weight is 199 lb 6.4 oz (90.4 kg). Her temperature is 97.6 F (36.4 C). Her blood pressure is  152/87 (abnormal) and her pulse is 101 (abnormal). Her respiration is 20 and oxygen saturation is 100%. .  No significant changes. Lungs are clear to auscultation bilaterally. Heart has regular rate and rhythm. No palpable cervical, supraclavicular, or axillary adenopathy. Abdomen soft, non-tender, normal bowel sounds.  On pelvic examination the external genitalia are unremarkable.  A speculum exam was performed.  The cervical os is flush with the upper vagina.  Some mild radiation changes in the upper vaginal region.  On bimanual and rectovaginal examination  there were no pelvic masses.  Rectal sphincter tone good.   Lab Findings: Lab Results  Component Value Date   WBC 4.0 06/23/2021   HGB 11.8 (L) 06/23/2021   HCT 33.9 (L) 06/23/2021   MCV 92.4 06/23/2021   PLT 170 06/23/2021    Radiographic Findings: No results found.  Impression:  Clincal stage IIIC-1 (cT2, cN1, cM0) poorly differentiated cervical cancer  No evidence of recurrence on clinical exam today.  As above the patient had complete resolution of her malignancy on PET scan posttreatment.  Her sciatic pain has improved significantly and she will reinitiate use of the vaginal dilator.  Plan: Routine follow-up in 6 months.  She will likely undergo a Pap smear at that time which will be 1 year out from her radiation treatment.  In addition patient will follow up with Dr. Berline Lopes in approximately 3 months.   25 minutes of total time was spent for this patient encounter, including preparation, face-to-face counseling with the patient and coordination of care, physical exam, and documentation of the encounter. ____________________________________  Blair Promise, PhD, MD   This document serves as a record of services personally performed by Gery Pray, MD. It was created on his behalf by Roney Mans, a trained medical scribe. The creation of this record is based on the scribe's personal observations and the provider's statements to them. This document has been checked and approved by the attending provider.

## 2021-06-23 ENCOUNTER — Encounter: Payer: Self-pay | Admitting: Radiation Oncology

## 2021-06-23 ENCOUNTER — Inpatient Hospital Stay: Payer: Medicare PPO

## 2021-06-23 ENCOUNTER — Other Ambulatory Visit: Payer: Self-pay

## 2021-06-23 ENCOUNTER — Encounter: Payer: Self-pay | Admitting: Hematology and Oncology

## 2021-06-23 ENCOUNTER — Inpatient Hospital Stay: Payer: Medicare PPO | Admitting: Hematology and Oncology

## 2021-06-23 ENCOUNTER — Ambulatory Visit
Admission: RE | Admit: 2021-06-23 | Discharge: 2021-06-23 | Disposition: A | Discharge status: Home / Self Care | Payer: Medicare PPO | Source: Ambulatory Visit | Attending: Radiation Oncology | Admitting: Radiation Oncology

## 2021-06-23 VITALS — BP 152/87 | HR 101 | Temp 97.6°F | Resp 20 | Ht 62.0 in | Wt 199.4 lb

## 2021-06-23 DIAGNOSIS — Z8616 Personal history of COVID-19: Secondary | ICD-10-CM | POA: Insufficient documentation

## 2021-06-23 DIAGNOSIS — C538 Malignant neoplasm of overlapping sites of cervix uteri: Secondary | ICD-10-CM

## 2021-06-23 DIAGNOSIS — I129 Hypertensive chronic kidney disease with stage 1 through stage 4 chronic kidney disease, or unspecified chronic kidney disease: Secondary | ICD-10-CM | POA: Insufficient documentation

## 2021-06-23 DIAGNOSIS — I10 Essential (primary) hypertension: Secondary | ICD-10-CM | POA: Diagnosis not present

## 2021-06-23 DIAGNOSIS — E1122 Type 2 diabetes mellitus with diabetic chronic kidney disease: Secondary | ICD-10-CM | POA: Insufficient documentation

## 2021-06-23 DIAGNOSIS — Z923 Personal history of irradiation: Secondary | ICD-10-CM | POA: Diagnosis not present

## 2021-06-23 DIAGNOSIS — N183 Chronic kidney disease, stage 3 unspecified: Secondary | ICD-10-CM | POA: Insufficient documentation

## 2021-06-23 DIAGNOSIS — Z9221 Personal history of antineoplastic chemotherapy: Secondary | ICD-10-CM | POA: Diagnosis not present

## 2021-06-23 DIAGNOSIS — C539 Malignant neoplasm of cervix uteri, unspecified: Secondary | ICD-10-CM | POA: Insufficient documentation

## 2021-06-23 DIAGNOSIS — Z79899 Other long term (current) drug therapy: Secondary | ICD-10-CM | POA: Diagnosis not present

## 2021-06-23 DIAGNOSIS — Z8541 Personal history of malignant neoplasm of cervix uteri: Secondary | ICD-10-CM | POA: Insufficient documentation

## 2021-06-23 LAB — CBC WITH DIFFERENTIAL (CANCER CENTER ONLY)
Abs Immature Granulocytes: 0.01 10*3/uL (ref 0.00–0.07)
Basophils Absolute: 0 10*3/uL (ref 0.0–0.1)
Basophils Relative: 1 %
Eosinophils Absolute: 0.2 10*3/uL (ref 0.0–0.5)
Eosinophils Relative: 5 %
HCT: 33.9 % — ABNORMAL LOW (ref 36.0–46.0)
Hemoglobin: 11.8 g/dL — ABNORMAL LOW (ref 12.0–15.0)
Immature Granulocytes: 0 %
Lymphocytes Relative: 18 %
Lymphs Abs: 0.7 10*3/uL (ref 0.7–4.0)
MCH: 32.2 pg (ref 26.0–34.0)
MCHC: 34.8 g/dL (ref 30.0–36.0)
MCV: 92.4 fL (ref 80.0–100.0)
Monocytes Absolute: 0.4 10*3/uL (ref 0.1–1.0)
Monocytes Relative: 9 %
Neutro Abs: 2.7 10*3/uL (ref 1.7–7.7)
Neutrophils Relative %: 67 %
Platelet Count: 170 10*3/uL (ref 150–400)
RBC: 3.67 MIL/uL — ABNORMAL LOW (ref 3.87–5.11)
RDW: 13.2 % (ref 11.5–15.5)
WBC Count: 4 10*3/uL (ref 4.0–10.5)
nRBC: 0 % (ref 0.0–0.2)

## 2021-06-23 LAB — BASIC METABOLIC PANEL - CANCER CENTER ONLY
Anion gap: 7 (ref 5–15)
BUN: 14 mg/dL (ref 8–23)
CO2: 30 mmol/L (ref 22–32)
Calcium: 9.4 mg/dL (ref 8.9–10.3)
Chloride: 100 mmol/L (ref 98–111)
Creatinine: 0.93 mg/dL (ref 0.44–1.00)
GFR, Estimated: >60 mL/min (ref >60)
Glucose, Bld: 162 mg/dL — ABNORMAL HIGH (ref 70–99)
Potassium: 3.8 mmol/L (ref 3.5–5.1)
Sodium: 137 mmol/L (ref 135–145)

## 2021-06-23 MED ORDER — HEPARIN SOD (PORK) LOCK FLUSH 100 UNIT/ML IV SOLN
500.0000 [IU] | Freq: Once | INTRAVENOUS | Status: AC
  Administered 2021-06-23: 500 [IU]

## 2021-06-23 MED ORDER — SODIUM CHLORIDE 0.9% FLUSH
10.0000 mL | Freq: Once | INTRAVENOUS | Status: AC
  Administered 2021-06-23: 10 mL

## 2021-06-23 NOTE — Assessment & Plan Note (Signed)
Blood pressure is intermittently elevated She had multiple risk factors for cardiovascular disease Recommend the patient to establish care with primary care doctor

## 2021-06-23 NOTE — Progress Notes (Signed)
[place] OFFICE PROGRESS NOTE  Patient Care Team: Alvester Chou, NP as PCP - General (Nurse Practitioner) Jacqulyn Liner, RN as Oncology Nurse Navigator (Oncology)  ASSESSMENT & PLAN:  Cervical cancer [facility]) Her recent back pain has completely resolved and she is completely asymptomatic We will maintain her port for 1 year She will continue close monitoring and follow-up between GYN surgeon and radiation oncologist  Chronic kidney disease (CKD), stage III (moderate) ([place]) She has intermittent elevated serum creatinine She has multiple risk factors including diabetes and hypertension I recommend her to establish with new primary care doctor for management  Essential hypertension Blood pressure is intermittently elevated She had multiple risk factors for cardiovascular disease Recommend the patient to establish care with primary care doctor  No orders of the defined types were placed in this encounter.   All questions were answered. The patient knows to call the clinic with any problems, questions or concerns. The total time spent in the appointment was 20 minutes encounter with patients including review of chart and various tests results, discussions about plan of care and coordination of care plan   Heath Lark, MD 06/23/2021 11:19 AM  INTERVAL HISTORY: Please see below for problem oriented charting. she returns for follow-up When I saw her last, she has severe back pain Her pain actually resolved after treatment for UTI/infection She denies pelvic pain, discomfort or abnormal vaginal discharge  REVIEW OF SYSTEMS:   Constitutional: Denies fevers, chills or abnormal weight loss Eyes: Denies blurriness of vision Ears, nose, mouth, throat, and face: Denies mucositis or sore throat Respiratory: Denies cough, dyspnea or wheezes Cardiovascular: Denies palpitation, chest discomfort or lower extremity swelling Gastrointestinal:  Denies nausea, heartburn or change in  bowel habits Skin: Denies abnormal skin rashes Lymphatics: Denies new lymphadenopathy or easy bruising Neurological:Denies numbness, tingling or new weaknesses Behavioral/Psych: Mood is stable, no new changes  All other systems were reviewed with the patient and are negative.  I have reviewed the past medical history, past surgical history, social history and family history with the patient and they are unchanged from previous note.  ALLERGIES:  has No Known Allergies.  MEDICATIONS:  Current Outpatient Medications  Medication Sig Dispense Refill   calcium carbonate (TUMS - DOSED IN MG ELEMENTAL CALCIUM) 500 MG chewable tablet Chew 1 tablet by mouth as needed for indigestion or heartburn.     ezetimibe (ZETIA) 10 MG tablet Take 10 mg by mouth daily.     JANUVIA 100 MG tablet TAKE 1 TABLET(100 MG) BY MOUTH DAILY 30 tablet 0   lidocaine-prilocaine (EMLA) cream Apply to affected area once 30 g 3   magnesium oxide (MAG-OX) 400 (241.3 Mg) MG tablet Take 1 tablet (400 mg total) by mouth 2 (two) times daily. 60 tablet 11   rosuvastatin (CRESTOR) 40 MG tablet Take 40 mg by mouth at bedtime.     vitamin B-12 (CYANOCOBALAMIN) 1000 MCG tablet Take 1,000 mcg by mouth daily.     No current facility-administered medications for this visit.    SUMMARY OF ONCOLOGIC HISTORY: Oncology History  Cervical cancer ([place])  08/13/2020 Pathology Results   Cervical biopsy showed poorly differentiated carcinoma. 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   08/27/2020 Imaging   MRI pelvis Large cervical mass with pelvic adenopathy, at least T2B N1 disease. Question of involvement of posterior urinary bladder (T4) for which additional images have been requested. Patient will return for additional images to answer this question. (Thinner section  axial T2 without fat sat and sagittal T2 weighted imaging also without fat saturation.)   Small field-of-view images show that the  parametrial extension comes in very close proximity to the RIGHT ureter.   08/30/2020 Initial Diagnosis   Cervical cancer ([place])   08/30/2020 Cancer Staging   Staging form: Cervix Uteri, AJCC Version 9 - Clinical stage from 08/30/2020: Stage IIIC1 (cT2, cN1, cM0) - Signed by Heath Lark, MD on 08/30/2020   09/01/2020 PET scan   1. Large cervical mass extending into the endometrial canal to the level of the uterine fundus, associated with pelvic adenopathy to the level of the RIGHT common iliac chain. Please refer to MRI for further detail. 2. No signs of solid organ uptake or metastatic disease to the chest. 3. Subcutaneous nodule with marked increased metabolic activity, correlation with direct clinical inspection is suggested to exclude cutaneous neoplasm or subcutaneous nodule in this location. Complicated sebaceous cyst could also potentially have this appearance. 4. Uptake in the anal canal could likely physiologic. Given the intense nature of the uptake would suggest correlation with direct clinical inspection/examination. 5. Uptake in axillary lymph nodes with activity less than mediastinal blood pool likely small reactive lymph nodes, attention on follow-up. Morphologic features also with benign appearance.   09/16/2020 Procedure   Successful placement of a power injectable Port-A-Cath via the right internal jugular vein. The catheter is ready for immediate use.     09/20/2020 - 10/25/2020 Chemotherapy   She received cisplatin chemo with concurrent radiation       09/22/2020 - 12/08/2020 Radiation Therapy   Radiation Treatment Dates: 09/22/2020 through 12/08/2020   Site: Pelvis Technique: IMRT Total Dose (Gy): 45/45 Dose per Fx (Gy): 1.8 Completed Fx: 25/25 Beam Energies: 6x   Site: pelvic boost Technique: IMRT Total Dose (Gy): 7.2/7.2 Dose per Fx (Gy): 1.8 Completed Fx: 4/4 Beam Energies: 6x   Site: Cervix boost Technique: HDR-brachytherapy Total Dose (Gy):  27.5/27.5 Dose per Fx (Gy): 5.5 Completed Fx: 5/5 Beam Energies: Ir-192     03/17/2021 PET scan   1. Mark positive response to therapy. 2. Resolution of metabolic activity within the uterine body. 3. Resolution of size and metabolic activity of pelvic lymph nodes. 4. No evidence of new disease outside of the pelvis.   Malignant neoplasm of cervix ([place])  09/01/2020 Initial Diagnosis   Malignant neoplasm of cervix ([place])   09/20/2020 - 10/25/2020 Chemotherapy   She received cisplatin chemo with concurrent radiation         PHYSICAL EXAMINATION: ECOG PERFORMANCE STATUS: 0 - Asymptomatic  Vitals:   06/23/21 0840  BP: (!) 153/75  Pulse: 98  Resp: 18  Temp: 98 F (36.7 C)  SpO2: 100%   Filed Weights   06/23/21 0840  Weight: 199 lb 12.8 oz (90.6 kg)    GENERAL:alert, no distress and comfortable NEURO: alert & oriented x 3 with fluent speech, no focal motor/sensory deficits  LABORATORY DATA:  I have reviewed the data as listed    Component Value Date/Time   NA 137 06/23/2021 0830   K 3.8 06/23/2021 0830   CL 100 06/23/2021 0830   CO2 30 06/23/2021 0830   GLUCOSE 162 (H) 06/23/2021 0830   BUN 14 06/23/2021 0830   CREATININE 0.93 06/23/2021 0830   CALCIUM 9.4 06/23/2021 0830   PROT 6.8 05/27/2021 1130   ALBUMIN 3.9 05/27/2021 1130   AST 12 (L) 05/27/2021 1130   ALT 9 05/27/2021 1130   ALKPHOS 44 05/27/2021 1130   BILITOT  1.2 05/27/2021 1130   GFRNONAA >60 06/23/2021 0830    No results found for: SPEP, UPEP  Lab Results  Component Value Date   WBC 4.0 06/23/2021   NEUTROABS 2.7 06/23/2021   HGB 11.8 (L) 06/23/2021   HCT 33.9 (L) 06/23/2021   MCV 92.4 06/23/2021   PLT 170 06/23/2021      Chemistry      Component Value Date/Time   NA 137 06/23/2021 0830   K 3.8 06/23/2021 0830   CL 100 06/23/2021 0830   CO2 30 06/23/2021 0830   BUN 14 06/23/2021 0830   CREATININE 0.93 06/23/2021 0830      Component Value Date/Time   CALCIUM 9.4 06/23/2021 0830    ALKPHOS 44 05/27/2021 1130   AST 12 (L) 05/27/2021 1130   ALT 9 05/27/2021 1130   BILITOT 1.2 05/27/2021 1130

## 2021-06-23 NOTE — Assessment & Plan Note (Signed)
Her recent back pain has completely resolved and she is completely asymptomatic We will maintain her port for 1 year She will continue close monitoring and follow-up between GYN surgeon and radiation oncologist

## 2021-06-23 NOTE — Assessment & Plan Note (Signed)
She has intermittent elevated serum creatinine She has multiple risk factors including diabetes and hypertension I recommend her to establish with new primary care doctor for management 

## 2021-06-23 NOTE — Progress Notes (Signed)
Misty Deleon is here today for follow up post radiation to the pelvic.  They completed their radiation on: 12/08/2020   Does the patient complain of any of the following:  Pain:denies Abdominal bloating: denies Diarrhea/Constipation: denies Nausea/Vomiting: denies Vaginal Discharge: denies Blood in Urine or Stool: denies Urinary Issues (dysuria/incomplete emptying/ incontinence/ increased frequency/urgency): nocturia x2, urgency Does patient report using vaginal dilator 2-3 times a week and/or sexually active 2-3 weeks: did initially but had to stop for sciatic pain due to shooting pain down left leg and being unable to move. Post radiation skin changes: denies   Additional comments if applicable: none  Vitals:   06/23/21 0919  BP: (!) 152/87  Pulse: (!) 101  Resp: 20  Temp: 97.6 F (36.4 C)  SpO2: 100%  Weight: 199 lb 6.4 oz (90.4 kg)  Height: 5\' 2"  (1.575 m)

## 2021-07-15 ENCOUNTER — Other Ambulatory Visit: Payer: Self-pay | Admitting: Hematology and Oncology

## 2021-07-18 ENCOUNTER — Encounter: Payer: Self-pay | Admitting: Hematology and Oncology

## 2021-08-08 ENCOUNTER — Telehealth: Payer: Self-pay | Admitting: *Deleted

## 2021-08-08 NOTE — Telephone Encounter (Signed)
Misty Deleon from radiation called and scheduled a follow up appt for 1/16 with Dr Berline Lopes. Misty Deleon will contact the patient

## 2021-08-08 NOTE — Telephone Encounter (Signed)
CALLED PATIENT TO INFORM OF FU WITH DR. Berline Lopes ON 09-26-21- ARRIVAL TIME- 12:45 PM, SPOKE WITH PATIENT AND SHE IS AWARE OF THIS APPT.

## 2021-08-23 ENCOUNTER — Inpatient Hospital Stay: Payer: Medicare PPO | Attending: Gynecologic Oncology

## 2021-08-23 ENCOUNTER — Other Ambulatory Visit: Payer: Self-pay

## 2021-08-23 DIAGNOSIS — C539 Malignant neoplasm of cervix uteri, unspecified: Secondary | ICD-10-CM | POA: Insufficient documentation

## 2021-08-23 DIAGNOSIS — Z452 Encounter for adjustment and management of vascular access device: Secondary | ICD-10-CM | POA: Diagnosis present

## 2021-08-23 MED ORDER — HEPARIN SOD (PORK) LOCK FLUSH 100 UNIT/ML IV SOLN
500.0000 [IU] | Freq: Once | INTRAVENOUS | Status: AC
  Administered 2021-08-23: 500 [IU]

## 2021-08-23 MED ORDER — SODIUM CHLORIDE 0.9% FLUSH
10.0000 mL | Freq: Once | INTRAVENOUS | Status: AC
  Administered 2021-08-23: 10 mL

## 2021-08-26 ENCOUNTER — Other Ambulatory Visit: Payer: Self-pay | Admitting: Hematology and Oncology

## 2021-09-20 ENCOUNTER — Encounter: Payer: Self-pay | Admitting: Gynecologic Oncology

## 2021-09-26 ENCOUNTER — Other Ambulatory Visit: Payer: Self-pay

## 2021-09-26 ENCOUNTER — Inpatient Hospital Stay: Payer: Medicare PPO | Attending: Gynecologic Oncology | Admitting: Gynecologic Oncology

## 2021-09-26 ENCOUNTER — Encounter: Payer: Self-pay | Admitting: Gynecologic Oncology

## 2021-09-26 VITALS — BP 171/94 | HR 100 | Temp 98.9°F | Resp 18 | Ht 61.42 in | Wt 217.0 lb

## 2021-09-26 DIAGNOSIS — Z923 Personal history of irradiation: Secondary | ICD-10-CM | POA: Insufficient documentation

## 2021-09-26 DIAGNOSIS — E1122 Type 2 diabetes mellitus with diabetic chronic kidney disease: Secondary | ICD-10-CM | POA: Insufficient documentation

## 2021-09-26 DIAGNOSIS — R350 Frequency of micturition: Secondary | ICD-10-CM | POA: Diagnosis not present

## 2021-09-26 DIAGNOSIS — N183 Chronic kidney disease, stage 3 unspecified: Secondary | ICD-10-CM | POA: Insufficient documentation

## 2021-09-26 DIAGNOSIS — E282 Polycystic ovarian syndrome: Secondary | ICD-10-CM | POA: Diagnosis not present

## 2021-09-26 DIAGNOSIS — Z8541 Personal history of malignant neoplasm of cervix uteri: Secondary | ICD-10-CM | POA: Insufficient documentation

## 2021-09-26 DIAGNOSIS — E7800 Pure hypercholesterolemia, unspecified: Secondary | ICD-10-CM | POA: Insufficient documentation

## 2021-09-26 DIAGNOSIS — Z7984 Long term (current) use of oral hypoglycemic drugs: Secondary | ICD-10-CM | POA: Diagnosis not present

## 2021-09-26 DIAGNOSIS — D509 Iron deficiency anemia, unspecified: Secondary | ICD-10-CM | POA: Insufficient documentation

## 2021-09-26 DIAGNOSIS — I129 Hypertensive chronic kidney disease with stage 1 through stage 4 chronic kidney disease, or unspecified chronic kidney disease: Secondary | ICD-10-CM | POA: Diagnosis not present

## 2021-09-26 DIAGNOSIS — C539 Malignant neoplasm of cervix uteri, unspecified: Secondary | ICD-10-CM

## 2021-09-26 DIAGNOSIS — Z79899 Other long term (current) drug therapy: Secondary | ICD-10-CM | POA: Diagnosis not present

## 2021-09-26 DIAGNOSIS — Z9221 Personal history of antineoplastic chemotherapy: Secondary | ICD-10-CM | POA: Insufficient documentation

## 2021-09-26 MED ORDER — LIDOCAINE-PRILOCAINE 2.5-2.5 % EX CREA: TOPICAL_CREAM | CUTANEOUS | 3 refills | Status: DC

## 2021-09-26 NOTE — Progress Notes (Signed)
Gynecologic Oncology Return Clinic Visit  09/26/2021  Reason for Visit: Surveillance visit in the setting of cervical cancer  Treatment History: Oncology History  Cervical cancer ([place])  08/13/2020 Pathology Results   Cervical biopsy showed poorly differentiated carcinoma. 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   08/27/2020 Imaging   MRI pelvis Large cervical mass with pelvic adenopathy, at least T2B N1 disease. Question of involvement of posterior urinary bladder (T4) for which additional images have been requested. Patient will return for additional images to answer this question. (Thinner section axial T2 without fat sat and sagittal T2 weighted imaging also without fat saturation.)   Small field-of-view images show that the parametrial extension comes in very close proximity to the RIGHT ureter.   08/30/2020 Initial Diagnosis   Cervical cancer ([place])   08/30/2020 Cancer Staging   Staging form: Cervix Uteri, AJCC Version 9 - Clinical stage from 08/30/2020: Stage IIIC1 (cT2, cN1, cM0) - Signed by Heath Lark, MD on 08/30/2020    09/01/2020 PET scan   1. Large cervical mass extending into the endometrial canal to the level of the uterine fundus, associated with pelvic adenopathy to the level of the RIGHT common iliac chain. Please refer to MRI for further detail. 2. No signs of solid organ uptake or metastatic disease to the chest. 3. Subcutaneous nodule with marked increased metabolic activity, correlation with direct clinical inspection is suggested to exclude cutaneous neoplasm or subcutaneous nodule in this location. Complicated sebaceous cyst could also potentially have this appearance. 4. Uptake in the anal canal could likely physiologic. Given the intense nature of the uptake would suggest correlation with direct clinical inspection/examination. 5. Uptake in axillary lymph nodes with activity less than mediastinal blood pool likely small reactive  lymph nodes, attention on follow-up. Morphologic features also with benign appearance.   09/16/2020 Procedure   Successful placement of a power injectable Port-A-Cath via the right internal jugular vein. The catheter is ready for immediate use.     09/20/2020 - 10/25/2020 Chemotherapy   She received cisplatin chemo with concurrent radiation       09/22/2020 - 12/08/2020 Radiation Therapy   Radiation Treatment Dates: 09/22/2020 through 12/08/2020   Site: Pelvis Technique: IMRT Total Dose (Gy): 45/45 Dose per Fx (Gy): 1.8 Completed Fx: 25/25 Beam Energies: 6x   Site: pelvic boost Technique: IMRT Total Dose (Gy): 7.2/7.2 Dose per Fx (Gy): 1.8 Completed Fx: 4/4 Beam Energies: 6x   Site: Cervix boost Technique: HDR-brachytherapy Total Dose (Gy): 27.5/27.5 Dose per Fx (Gy): 5.5 Completed Fx: 5/5 Beam Energies: Ir-192     03/17/2021 PET scan   1. Mark positive response to therapy. 2. Resolution of metabolic activity within the uterine body. 3. Resolution of size and metabolic activity of pelvic lymph nodes. 4. No evidence of new disease outside of the pelvis.   Malignant neoplasm of cervix ([place])  09/01/2020 Initial Diagnosis   Malignant neoplasm of cervix ([place])   09/20/2020 - 10/25/2020 Chemotherapy   She received cisplatin chemo with concurrent radiation       09/22/2020 - 12/08/2020 Radiation Therapy   Radiation Treatment Dates: 09/22/2020 through 12/08/2020   Site: Pelvis Technique: IMRT Total Dose (Gy): 45/45 Dose per Fx (Gy): 1.8 Completed Fx: 25/25 Beam Energies: 6x   Site: pelvic boost Technique: IMRT Total Dose (Gy): 7.2/7.2 Dose per Fx (Gy): 1.8 Completed Fx: 4/4 Beam Energies: 6x   Site: Cervix boost Technique: HDR-brachytherapy Total Dose (Gy): 27.5/27.5 Dose per Fx (Gy): 5.5 Completed Fx: 5/5  Beam Energies: Ir-192     Interval History: She completed radiation at the end of March 2022.  Patient was seen by Dr. Sondra Come in October.  She was NED at that  time.  Her posttreatment PET scan in 03/2021 showed response to therapy, resolution of metabolic activity within the uterine body as well as resolution of size and metabolic activity of pelvic lymph nodes.  No evidence of new disease outside of the pelvis.  She reports doing well.  Denies any vaginal bleeding or discharge.  Using her vaginal dilator once or twice a week.  Reports regular bowel function.  Has some urinary frequency, unchanged from baseline.  Denies any pelvic or abdominal pain.  Had some left back and left leg pain around the time that she was finishing treatment, this was suspected to be sciatic pain.  She rested for several weeks and has not had recurrence of the pain.  Past Medical/Surgical History: Past Medical History:  Diagnosis Date   CKD (chronic kidney disease), stage III (H[facility])    Deficiency anemia    B12 def.  treated with b12 injection and has received iron infusion's   GERD (gastroesophageal reflux disease)    History of radiation therapy 09/22/20-11/01/20   Cervix, IMRT    Dr. Gery Pray   History of radiation therapy 11/09/2020-12/08/2020   cervix, intracavitary brachytherapy   Dr Gery Pray   Hypercholesteremia    Hypertension    Hypomagnesemia    Malignant neoplasm cervix [facility]) oncologist--- dr gorsuch/  radiation oncology--- dr Sondra Come   dx 12/ 2021,  Stage IIIC-1,  with pelvic adeopathy;  concurrent chemo/ radiation;  chemo started 09-20-2020 and pt completed IMRT 11-01-2020 / scheduled for high dose brachytherapy to start 11-09-2020   PCOS (polycystic ovarian syndrome)    PMB (postmenopausal bleeding)    Type 2 diabetes mellitus ([place])    followed by pc   (11-04-2020 pt stated does not check blood sugar)   Wears contact lenses     Past Surgical History:  Procedure Laterality Date   CESAREAN SECTION  x2 last one [place]  09/16/2020   OPERATIVE ULTRASOUND N/A 11/09/2020   Procedure: OPERATIVE ULTRASOUND;  Surgeon: Gery Pray,  MD;  Location: [place];  Service: Urology;  Laterality: N/A;   OPERATIVE ULTRASOUND N/A 11/16/2020   Procedure: OPERATIVE ULTRASOUND;  Surgeon: Gery Pray, MD;  Location: [facility];  Service: Urology;  Laterality: N/A;   OPERATIVE ULTRASOUND N/A 11/25/2020   Procedure: OPERATIVE ULTRASOUND;  Surgeon: Gery Pray, MD;  Location: [facility];  Service: Urology;  Laterality: N/A;   OPERATIVE ULTRASOUND N/A 11/29/2020   Procedure: OPERATIVE ULTRASOUND;  Surgeon: Gery Pray, MD;  Location: [facility];  Service: Urology;  Laterality: N/A;   OPERATIVE ULTRASOUND N/A 12/08/2020   Procedure: OPERATIVE ULTRASOUND;  Surgeon: Gery Pray, MD;  Location: [facility];  Service: Urology;  Laterality: N/A;   TANDEM RING INSERTION N/A 11/09/2020   Procedure: TANDEM RING INSERTION;  Surgeon: Gery Pray, MD;  Location: [facility];  Service: Urology;  Laterality: N/A;   TANDEM RING INSERTION N/A 11/16/2020   Procedure: TANDEM RING INSERTION;  Surgeon: Gery Pray, MD;  Location: [facility];  Service: Urology;  Laterality: N/A;   TANDEM RING INSERTION N/A 11/25/2020   Procedure: TANDEM RING INSERTION;  Surgeon: Gery Pray, MD;  Location: [facility];  Service: Urology;  Laterality: N/A;  TANDEM RING INSERTION N/A 11/29/2020   Procedure: TANDEM RING INSERTION;  Surgeon: Gery Pray, MD;  Location: [facility];  Service: Urology;  Laterality: N/A;   TANDEM RING INSERTION N/A 12/08/2020   Procedure: TANDEM RING INSERTION;  Surgeon: Gery Pray, MD;  Location: [facility];  Service: Urology;  Laterality: N/A;   WISDOM TOOTH EXTRACTION  1980s    Family History  Problem Relation Age of Onset   Diabetes Father    Brain cancer Son    Breast cancer Neg Hx    Colon cancer Neg Hx    Ovarian cancer Neg Hx    Endometrial cancer Neg Hx    Prostate cancer  Neg Hx    Pancreatic cancer Neg Hx     Social History   Socioeconomic History   Marital status: Married    Spouse name: Not on file   Number of children: 2   Years of education: Not on file   Highest education level: Not on file  Occupational History   Occupation: retired Patent examiner  Tobacco Use   Smoking status: Never   Smokeless tobacco: Never  Vaping Use   Vaping Use: Never used  Substance and Sexual Activity   Alcohol use: Never   Drug use: Never   Sexual activity: Not on file  Other Topics Concern   Not on file  Social History Narrative   Not on file   Social Determinants of Health   Financial Resource Strain: Not on file  Food Insecurity: Not on file  Transportation Needs: Not on file  Physical Activity: Not on file  Stress: Not on file  Social Connections: Not on file    Current Medications:  Current Outpatient Medications:    calcium carbonate (TUMS - DOSED IN MG ELEMENTAL CALCIUM) 500 MG chewable tablet, Chew 1 tablet by mouth as needed for indigestion or heartburn., Disp: , Rfl:    ezetimibe (ZETIA) 10 MG tablet, Take 10 mg by mouth daily., Disp: , Rfl:    JANUVIA 100 MG tablet, TAKE 1 TABLET(100 MG) BY MOUTH DAILY, Disp: 30 tablet, Rfl: 0   magnesium oxide (MAG-OX) 400 (241.3 Mg) MG tablet, Take 1 tablet (400 mg total) by mouth 2 (two) times daily., Disp: 60 tablet, Rfl: 11   rosuvastatin (CRESTOR) 40 MG tablet, Take 40 mg by mouth at bedtime., Disp: , Rfl:    vitamin B-12 (CYANOCOBALAMIN) 1000 MCG tablet, Take 1,000 mcg by mouth daily., Disp: , Rfl:    lidocaine-prilocaine (EMLA) cream, Apply to affected area once, Disp: 30 g, Rfl: 3  Review of Systems: Denies appetite changes, fevers, chills, fatigue, unexplained weight changes. Denies hearing loss, neck lumps or masses, mouth sores, ringing in ears or voice changes. Denies cough or wheezing.  Denies shortness of breath. Denies chest pain or palpitations. Denies leg swelling. Denies  abdominal distention, pain, blood in stools, constipation, diarrhea, nausea, vomiting, or early satiety. Denies pain with intercourse, dysuria, frequency, hematuria or incontinence. Denies hot flashes, pelvic pain, vaginal bleeding or vaginal discharge.   Denies joint pain, back pain or muscle pain/cramps. Denies itching, rash, or wounds. Denies dizziness, headaches, numbness or seizures. Denies swollen lymph nodes or glands, denies easy bruising or bleeding. Denies anxiety, depression, confusion, or decreased concentration.  Physical Exam: BP (!) 171/94 (BP Location: Right Arm, Patient Position: Sitting)    Pulse 100    Temp 98.9 F (37.2 C) (Oral)    Resp 18    Ht 5' 1.42" (1.56 m)  Wt 217 lb (98.4 kg)    SpO2 100%    BMI 40.45 kg/m  General: Alert, oriented, no acute distress. HEENT: Normocephalic, atraumatic, sclera anicteric. Chest: Clear to auscultation bilaterally.  No wheezes or rhonchi. Cardiovascular: Regular rate and rhythm, no murmurs. Abdomen: Obese, soft, nontender.  Normoactive bowel sounds.  No masses or hepatosplenomegaly appreciated.   Extremities: Grossly normal range of motion.  Warm, well perfused.  No edema bilaterally. Skin: No rashes or lesions noted. Lymphatics: No cervical, supraclavicular, or inguinal adenopathy. GU: Normal appearing external genitalia without erythema, excoriation, or lesions.  Speculum exam reveals moderately atrophic vaginal mucosa, radiation changes evident, no masses or lesions noted, no atypical vascularity.  Bimanual exam reveals cervix flush with the vaginal apex, smooth, no nodularity or masses.  Rectovaginal exam confirms these findings, no parametrial involvement.  Laboratory & Radiologic Studies: None new  Assessment & Plan: Misty Deleon is a 71 y.o. woman with Stage IIIC1 SCC of the cervix who presents for surveillance visit.  Patient is overall doing very well and is NED on exam.  She was also NED on posttreatment  imaging.  Patient would like to keep her port in at this time.  I sent a refill in for her Emla cream.  Stressed the importance of continuing to use her vaginal dilator.  Discussed again the reason that we have her use the vaginal dilator.  Patient has some questions about medications for chronic medical conditions like her hypertension.  She asked if I would send refills and for some of her medications.  I stressed the importance of her having a primary care provider to help manage her chronic medical conditions.  She will reach out to the PCP that she was seen before her cancer diagnosis.  I have asked her to let me know if she needs a new referral placed.  Patient's blood pressure is elevated today, she is asymptomatic.  Patient is interested in volunteering as a support patient for patients undergoing treatment.  We will email one of our social workers to pass her name along.  We discussed signs and symptoms that would be concerning for disease recurrence.  Per NCCN surveillance recommendations, we will continue with surveillance visits every 3 months alternating between myself and radiation oncology.  She will be due for a Pap test at her next visit with radiation oncology in April.   32 minutes of total time was spent for this patient encounter, including preparation, face-to-face counseling with the patient and coordination of care, and documentation of the encounter.  Jeral Pinch, MD  Division of Gynecologic Oncology  Department of Obstetrics and Gynecology  [facility]

## 2021-09-26 NOTE — Patient Instructions (Addendum)
It was good to see you today.  I do not see or feel any evidence of cancer recurrence on your exam.  As always, please call to see me sooner if you develop any concerning symptoms such as vaginal bleeding, pelvic pain, change to bowel function, or unintentional weight loss.  A Pap test will be done at your next visit with radiation oncology.  We will alternate visits every 3 months.  I will see you back in July.  My schedule is not out past the end of June.  Please call back sometime in May to get your next visit with me scheduled.  I sent a prescription for Emla cream to your pharmacy.  Please contact your primary care doctor about reestablishing care to help with treatment of your chronic medical problems.

## 2021-10-03 ENCOUNTER — Other Ambulatory Visit: Payer: Self-pay | Admitting: Hematology and Oncology

## 2021-10-04 ENCOUNTER — Telehealth: Payer: Self-pay

## 2021-10-04 ENCOUNTER — Other Ambulatory Visit: Payer: Self-pay | Admitting: Hematology and Oncology

## 2021-10-04 NOTE — Telephone Encounter (Signed)
Called and given below message. She verbalized understanding. She will call around and try to schedule appt with PCP.

## 2021-10-04 NOTE — Telephone Encounter (Signed)
-----   Message from Heath Lark, MD sent at 10/04/2021  8:04 AM EST ----- I told her many months ago she needs to establish care with new PCP I am not comfortable refilling all her primary care medications including Januvia I have been receiving multiple refills from pharmacy

## 2021-10-13 ENCOUNTER — Other Ambulatory Visit: Payer: Self-pay | Admitting: Hematology and Oncology

## 2021-10-20 ENCOUNTER — Other Ambulatory Visit: Payer: Self-pay

## 2021-10-20 ENCOUNTER — Inpatient Hospital Stay: Payer: Medicare PPO | Attending: Radiation Oncology

## 2021-10-20 DIAGNOSIS — C539 Malignant neoplasm of cervix uteri, unspecified: Secondary | ICD-10-CM

## 2021-10-20 DIAGNOSIS — Z8541 Personal history of malignant neoplasm of cervix uteri: Secondary | ICD-10-CM | POA: Insufficient documentation

## 2021-10-20 DIAGNOSIS — Z452 Encounter for adjustment and management of vascular access device: Secondary | ICD-10-CM | POA: Insufficient documentation

## 2021-10-20 MED ORDER — SODIUM CHLORIDE 0.9% FLUSH
10.0000 mL | Freq: Once | INTRAVENOUS | Status: AC
  Administered 2021-10-20: 10 mL

## 2021-10-20 MED ORDER — HEPARIN SOD (PORK) LOCK FLUSH 100 UNIT/ML IV SOLN
500.0000 [IU] | Freq: Once | INTRAVENOUS | Status: AC
  Administered 2021-10-20: 500 [IU]

## 2021-10-24 ENCOUNTER — Inpatient Hospital Stay: Payer: Medicare PPO

## 2021-12-12 ENCOUNTER — Telehealth: Payer: Self-pay | Admitting: *Deleted

## 2021-12-12 NOTE — Telephone Encounter (Signed)
RETURNED PATIENT'S PHONE CALL, SPOKE WITH PATIENT. ?

## 2021-12-22 ENCOUNTER — Ambulatory Visit: Payer: Medicare PPO | Admitting: Radiation Oncology

## 2021-12-22 ENCOUNTER — Inpatient Hospital Stay: Payer: Medicare PPO

## 2021-12-23 ENCOUNTER — Telehealth: Payer: Self-pay | Admitting: *Deleted

## 2021-12-23 NOTE — Telephone Encounter (Signed)
CALLED PATIENT TO ASK ABOUT RESCHEDULING FU ON 01-05-22 DUE TO  ?DR. KINARD BEING ON VACATION, SPOKE WITH PATIENT AND SHE AGREED TO COME ON 01-19-22 @ 8:30 AM ?

## 2021-12-29 ENCOUNTER — Other Ambulatory Visit: Payer: Self-pay

## 2021-12-29 ENCOUNTER — Inpatient Hospital Stay: Payer: Medicare PPO | Attending: Radiation Oncology

## 2021-12-29 DIAGNOSIS — Z452 Encounter for adjustment and management of vascular access device: Secondary | ICD-10-CM | POA: Diagnosis present

## 2021-12-29 DIAGNOSIS — Z8541 Personal history of malignant neoplasm of cervix uteri: Secondary | ICD-10-CM | POA: Insufficient documentation

## 2021-12-29 DIAGNOSIS — C539 Malignant neoplasm of cervix uteri, unspecified: Secondary | ICD-10-CM

## 2021-12-29 MED ORDER — HEPARIN SOD (PORK) LOCK FLUSH 100 UNIT/ML IV SOLN
500.0000 [IU] | Freq: Once | INTRAVENOUS | Status: AC
  Administered 2021-12-29: 500 [IU]

## 2021-12-29 MED ORDER — SODIUM CHLORIDE 0.9% FLUSH
10.0000 mL | Freq: Once | INTRAVENOUS | Status: AC
  Administered 2021-12-29: 10 mL

## 2022-01-05 ENCOUNTER — Ambulatory Visit: Payer: Medicare PPO | Admitting: Radiation Oncology

## 2022-01-18 NOTE — Progress Notes (Signed)
?Radiation Oncology         [phone] ?________________________________ ? ?Name: Misty Deleon MRN: [id number]  ?Date: 01/19/2022  DOB: 1951-01-21 ? ?Follow-Up Visit Note ? ?CC: Alvester Chou, NP  Alvester Chou, NP ? ?  ICD-10-CM   ?1. Malignant neoplasm of cervix, unspecified site [facility])  C53.9 Cytology - PAP  ?  ? ? ?Diagnosis: Clincal stage IIIC-1 (cT2, cN1, cM0) poorly differentiated cervical cancer ? ?Interval Since Last Radiation: 1 year, 1 month and 12 days  ? ?Radiation Treatment Dates: 09/22/2020 through 12/08/2020 ?  ?Site: Pelvis ?Technique: IMRT ?Total Dose (Gy): 45/45 ?Dose per Fx (Gy): 1.8 ?Completed Fx: 25/25 ?Beam Energies: 6x ?  ?Site: pelvic boost ?Technique: IMRT ?Total Dose (Gy): 7.2/7.2 ?Dose per Fx (Gy): 1.8 ?Completed Fx: 4/4 ?Beam Energies: 6x ?  ?Site: Cervix boost ?Technique: HDR-brachytherapy ?Total Dose (Gy): 27.5/27.5 ?Dose per Fx (Gy): 5.5 ?Completed Fx: 5/5 ?Beam Energies: Ir-192 ? ?Narrative:  The patient returns today for routine 6 month follow-up, she was last seen here for follow up on 06/23/21. Since her last visit, the patient followed up with Dr. Berline Lopes on 09/26/21. During which time, the patient the patient reported using her vaginal dilator once or twice a week, some urinary frequency (baseline), and some left back and left leg pain around the time that she was finishing treatment, suspected to be sciatic pain, which had since resolved. Otherwise, the patient denied any vaginal bleeding or discharge, and was noted as NED on examination. The patient also requested to keep her port in.        ? ?Otherwise, no significant interval history since the patient was last seen.   ? ?She continues to use her vaginal dilator twice a week.  She denies any bleeding after use.  She occasionally will have some pain along the left sacroiliac joint area after mowing her yard.  She reports this is a large area over 2 acres.  She denies any further "sciatic "pain.  She denies any hematuria or  rectal bleeding.  She denies any problems with abdominal bloating.  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                     ? ?Allergies:  has No Known Allergies. ? ?Meds: ?Current Outpatient Medications  ?Medication Sig Dispense Refill  ? calcium carbonate (TUMS - DOSED IN MG ELEMENTAL CALCIUM) 500 MG chewable tablet Chew 1 tablet by mouth as needed for indigestion or heartburn.    ? ezetimibe (ZETIA) 10 MG tablet Take 10 mg by mouth daily.    ? JANUVIA 100 MG tablet TAKE 1 TABLET(100 MG) BY MOUTH DAILY 30 tablet 0  ? lidocaine-prilocaine (EMLA) cream Apply to affected area once 30 g 3  ? magnesium oxide (MAG-OX) 400 (241.3 Mg) MG tablet Take 1 tablet (400 mg total) by mouth 2 (two) times daily. 60 tablet 11  ? rosuvastatin (CRESTOR) 40 MG tablet Take 40 mg by mouth at bedtime.    ? vitamin B-12 (CYANOCOBALAMIN) 1000 MCG tablet Take 1,000 mcg by mouth daily.    ? ?No current facility-administered medications for this encounter.  ? ? ?Physical Findings: ?The patient is in no acute distress. Patient is alert and oriented. ? height is '5\' 2"'$  (1.575 m) and weight is 224 lb 3.2 oz (101.7 kg). Her temperature is 97.7 ?F (36.5 ?C). Her blood pressure is 141/68 (abnormal) and her pulse is  99. Her respiration is 20 and oxygen saturation is 100%. .  No significant changes. Lungs are clear to auscultation bilaterally. Heart has regular rate and rhythm. No palpable cervical, supraclavicular, or axillary adenopathy. Abdomen soft, non-tender, normal bowel sounds. ? ?On pelvic examination the external genitalia were unremarkable. A speculum exam was performed.  There are no mucosal lesions noted in the vaginal vault.  The cervical os is flush with the upper vaginal mucosa.  A Pap smear was obtained of the endocervical canal.  This did result in some mild bleeding.  On bimanual examination no pelvic masses are appreciated.  Cervix smooth without  nodularity.  Rectovaginal examination confirms.  Rectal sphincter tone normal. ? ?Lab Findings: ?Lab Results  ?Component Value Date  ? WBC 4.0 06/23/2021  ? HGB 11.8 (L) 06/23/2021  ? HCT 33.9 (L) 06/23/2021  ? MCV 92.4 06/23/2021  ? PLT 170 06/23/2021  ? ? ?Radiographic Findings: ?No results found. ? ?Impression:  Clincal stage IIIC-1 (cT2, cN1, cM0) poorly differentiated cervical cancer ? ?No evidence of recurrence on clinical exam today.  She does not appear to be exhibiting any aftereffects from her external beam and intracavitary radiation treatments.  She wishes to keep her Port-A-Cath in place at the current time.  She reports her primary care physician does housecalls and she is resumed contact with her since [place]. ? ?Pap smear pending. ? ?Plan: She will follow-up with Dr. Berline Lopes in 3 months. routine follow-up in radiation oncology in 6 months. ? ? ?25 minutes of total time was spent for this patient encounter, including preparation, face-to-face counseling with the patient and coordination of care, physical exam, and documentation of the encounter. ?____________________________________ ? ?Blair Promise, PhD, MD ? ?This document serves as a record of services personally performed by Gery Pray, MD. It was created on his behalf by Roney Mans, a trained medical scribe. The creation of this record is based on the scribe's personal observations and the provider's statements to them. This document has been checked and approved by the attending provider. ? ?

## 2022-01-19 ENCOUNTER — Encounter: Payer: Self-pay | Admitting: Radiation Oncology

## 2022-01-19 ENCOUNTER — Other Ambulatory Visit: Payer: Self-pay

## 2022-01-19 ENCOUNTER — Ambulatory Visit
Admission: RE | Admit: 2022-01-19 | Discharge: 2022-01-19 | Disposition: A | Discharge status: Home / Self Care | Payer: Medicare PPO | Source: Ambulatory Visit | Attending: Radiation Oncology | Admitting: Radiation Oncology

## 2022-01-19 VITALS — BP 141/68 | HR 99 | Temp 97.7°F | Resp 20 | Ht 62.0 in | Wt 224.2 lb

## 2022-01-19 DIAGNOSIS — Z79899 Other long term (current) drug therapy: Secondary | ICD-10-CM | POA: Insufficient documentation

## 2022-01-19 DIAGNOSIS — C539 Malignant neoplasm of cervix uteri, unspecified: Secondary | ICD-10-CM

## 2022-01-19 DIAGNOSIS — Z923 Personal history of irradiation: Secondary | ICD-10-CM | POA: Diagnosis not present

## 2022-01-19 DIAGNOSIS — Z8541 Personal history of malignant neoplasm of cervix uteri: Secondary | ICD-10-CM | POA: Diagnosis present

## 2022-01-19 DIAGNOSIS — Z7984 Long term (current) use of oral hypoglycemic drugs: Secondary | ICD-10-CM | POA: Diagnosis not present

## 2022-01-19 DIAGNOSIS — C538 Malignant neoplasm of overlapping sites of cervix uteri: Secondary | ICD-10-CM

## 2022-01-19 NOTE — Progress Notes (Signed)
Misty Deleon is here today for follow up post radiation to the pelvic. ? ?They completed their radiation on: 12/08/20  ? ?Does the patient complain of any of the following: ? ?Pain:No ?Abdominal bloating: No ?Diarrhea/Constipation: No ?Nausea/Vomiting: No ?Vaginal Discharge: No ?Blood in Urine or Stool: No ?Urinary Issues (dysuria/incomplete emptying/ incontinence/ increased frequency/urgency): Patient reports mild urinary incontinence and urinary urgency.  ?Does patient report using vaginal dilator 2-3 times a week and/or sexually active 2-3 weeks: Yes, patient using dilator as directed.  ?Post radiation skin changes: No ? ? ?Additional comments if applicable:Patient reports having some tenderness to lower back with ambulation.  ? ?Vitals:  ? 01/19/22 0844  ?BP: (!) 141/68  ?Pulse: 99  ?Resp: 20  ?Temp: 97.7 ?F (36.5 ?C)  ?SpO2: 100%  ?Weight: 224 lb 3.2 oz (101.7 kg)  ?Height: '5\' 2"'$  (1.575 m)  ?  ?  ?

## 2022-01-25 LAB — CYTOLOGY - PAP
Diagnosis: NEGATIVE
Diagnosis: REACTIVE

## 2022-01-26 ENCOUNTER — Telehealth: Payer: Self-pay

## 2022-01-26 NOTE — Telephone Encounter (Signed)
Called to make patient aware of negative pap smear result. Patient voiced understanding.

## 2022-02-17 ENCOUNTER — Telehealth: Payer: Self-pay | Admitting: *Deleted

## 2022-02-17 ENCOUNTER — Telehealth: Payer: Self-pay

## 2022-02-17 NOTE — Telephone Encounter (Signed)
CALLED PATIENT TO INFORM OF FU APPT. WITH DR. Berline Deleon ON 04-20-22- ARRIVAL TIME- 1 PM , SPOKE WITH PATIENT AND SHE IS AWARE OF THIS APPT.

## 2022-02-17 NOTE — Telephone Encounter (Signed)
Misty Deleon from (RAD ONC) called office.  Pt is scheduled for a follow up on 04/20/22  at 1:30pm with Dr.Tucker.  Misty Deleon to notify pt of appointment date and time.

## 2022-02-21 ENCOUNTER — Other Ambulatory Visit: Payer: Self-pay

## 2022-02-21 ENCOUNTER — Inpatient Hospital Stay: Payer: Medicare PPO | Attending: Radiation Oncology

## 2022-02-21 DIAGNOSIS — Z8542 Personal history of malignant neoplasm of other parts of uterus: Secondary | ICD-10-CM | POA: Diagnosis not present

## 2022-02-21 DIAGNOSIS — Z8541 Personal history of malignant neoplasm of cervix uteri: Secondary | ICD-10-CM | POA: Diagnosis present

## 2022-02-21 DIAGNOSIS — C539 Malignant neoplasm of cervix uteri, unspecified: Secondary | ICD-10-CM

## 2022-02-21 DIAGNOSIS — Z452 Encounter for adjustment and management of vascular access device: Secondary | ICD-10-CM | POA: Insufficient documentation

## 2022-02-21 MED ORDER — HEPARIN SOD (PORK) LOCK FLUSH 100 UNIT/ML IV SOLN
500.0000 [IU] | Freq: Once | INTRAVENOUS | Status: AC
  Administered 2022-02-21: 500 [IU]

## 2022-02-21 MED ORDER — SODIUM CHLORIDE 0.9% FLUSH
10.0000 mL | Freq: Once | INTRAVENOUS | Status: AC
  Administered 2022-02-21: 10 mL

## 2022-04-18 ENCOUNTER — Telehealth: Payer: Self-pay | Admitting: *Deleted

## 2022-04-18 NOTE — Telephone Encounter (Signed)
Attempted to return the patient's call, LMOM to call the office back to reschedule appts

## 2022-04-19 ENCOUNTER — Telehealth: Payer: Self-pay | Admitting: *Deleted

## 2022-04-19 ENCOUNTER — Encounter: Payer: Self-pay | Admitting: Hematology and Oncology

## 2022-04-19 NOTE — Telephone Encounter (Signed)
Spoke with the patient and rescheduled her appts from this week to Monday 8/14

## 2022-04-19 NOTE — Telephone Encounter (Signed)
Error in last entry, appts moved to 8/21

## 2022-04-20 ENCOUNTER — Ambulatory Visit: Payer: Medicare PPO | Admitting: Gynecologic Oncology

## 2022-04-24 ENCOUNTER — Inpatient Hospital Stay: Payer: Medicare PPO

## 2022-04-28 ENCOUNTER — Encounter: Payer: Self-pay | Admitting: Gynecologic Oncology

## 2022-05-01 ENCOUNTER — Encounter: Payer: Self-pay | Admitting: Gynecologic Oncology

## 2022-05-01 ENCOUNTER — Inpatient Hospital Stay: Payer: Medicare PPO | Attending: Radiation Oncology | Admitting: Gynecologic Oncology

## 2022-05-01 ENCOUNTER — Other Ambulatory Visit: Payer: Self-pay

## 2022-05-01 ENCOUNTER — Inpatient Hospital Stay: Payer: Medicare PPO

## 2022-05-01 VITALS — BP 175/83 | HR 105 | Temp 98.4°F | Resp 18 | Wt 233.5 lb

## 2022-05-01 DIAGNOSIS — Z9221 Personal history of antineoplastic chemotherapy: Secondary | ICD-10-CM | POA: Insufficient documentation

## 2022-05-01 DIAGNOSIS — C539 Malignant neoplasm of cervix uteri, unspecified: Secondary | ICD-10-CM

## 2022-05-01 DIAGNOSIS — Z923 Personal history of irradiation: Secondary | ICD-10-CM | POA: Diagnosis not present

## 2022-05-01 DIAGNOSIS — Z8541 Personal history of malignant neoplasm of cervix uteri: Secondary | ICD-10-CM | POA: Insufficient documentation

## 2022-05-01 MED ORDER — LIDOCAINE-PRILOCAINE 2.5-2.5 % EX CREA: 1.0000 | TOPICAL_CREAM | CUTANEOUS | 1 refills | Status: DC | PRN

## 2022-05-01 MED ORDER — SODIUM CHLORIDE 0.9% FLUSH
10.0000 mL | Freq: Once | INTRAVENOUS | Status: AC
  Administered 2022-05-01: 10 mL

## 2022-05-01 MED ORDER — HEPARIN SOD (PORK) LOCK FLUSH 100 UNIT/ML IV SOLN
500.0000 [IU] | Freq: Once | INTRAVENOUS | Status: AC
  Administered 2022-05-01: 500 [IU]

## 2022-05-01 NOTE — Patient Instructions (Signed)
It was good to see you today.  I do not see or feel any evidence of cancer recurrence on your exam.  I will see you in 6 months.  Please call sometime after the new year to schedule a visit to see me in late February.  As always, if you develop any new and concerning symptoms before then, please call to see me sooner.

## 2022-05-01 NOTE — Progress Notes (Signed)
Gynecologic Oncology Return Clinic Visit  05/01/2022  Reason for Visit: Surveillance visit in the setting of cervical cancer  Treatment History: Oncology History  Cervical cancer ([place])  08/13/2020 Pathology Results   Cervical biopsy showed poorly differentiated carcinoma. 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   08/27/2020 Imaging   MRI pelvis Large cervical mass with pelvic adenopathy, at least T2B N1 disease. Question of involvement of posterior urinary bladder (T4) for which additional images have been requested. Patient will return for additional images to answer this question. (Thinner section axial T2 without fat sat and sagittal T2 weighted imaging also without fat saturation.)   Small field-of-view images show that the parametrial extension comes in very close proximity to the RIGHT ureter.   08/30/2020 Initial Diagnosis   Cervical cancer ([place])   08/30/2020 Cancer Staging   Staging form: Cervix Uteri, AJCC Version 9 - Clinical stage from 08/30/2020: Stage IIIC1 (cT2, cN1, cM0) - Signed by Heath Lark, MD on 08/30/2020   09/01/2020 PET scan   1. Large cervical mass extending into the endometrial canal to the level of the uterine fundus, associated with pelvic adenopathy to the level of the RIGHT common iliac chain. Please refer to MRI for further detail. 2. No signs of solid organ uptake or metastatic disease to the chest. 3. Subcutaneous nodule with marked increased metabolic activity, correlation with direct clinical inspection is suggested to exclude cutaneous neoplasm or subcutaneous nodule in this location. Complicated sebaceous cyst could also potentially have this appearance. 4. Uptake in the anal canal could likely physiologic. Given the intense nature of the uptake would suggest correlation with direct clinical inspection/examination. 5. Uptake in axillary lymph nodes with activity less than mediastinal blood pool likely small reactive  lymph nodes, attention on follow-up. Morphologic features also with benign appearance.   09/16/2020 Procedure   Successful placement of a power injectable Port-A-Cath via the right internal jugular vein. The catheter is ready for immediate use.     09/20/2020 - 10/25/2020 Chemotherapy   She received cisplatin chemo with concurrent radiation       09/22/2020 - 12/08/2020 Radiation Therapy   Radiation Treatment Dates: 09/22/2020 through 12/08/2020   Site: Pelvis Technique: IMRT Total Dose (Gy): 45/45 Dose per Fx (Gy): 1.8 Completed Fx: 25/25 Beam Energies: 6x   Site: pelvic boost Technique: IMRT Total Dose (Gy): 7.2/7.2 Dose per Fx (Gy): 1.8 Completed Fx: 4/4 Beam Energies: 6x   Site: Cervix boost Technique: HDR-brachytherapy Total Dose (Gy): 27.5/27.5 Dose per Fx (Gy): 5.5 Completed Fx: 5/5 Beam Energies: Ir-192     03/17/2021 PET scan   1. Mark positive response to therapy. 2. Resolution of metabolic activity within the uterine body. 3. Resolution of size and metabolic activity of pelvic lymph nodes. 4. No evidence of new disease outside of the pelvis.   Malignant neoplasm of cervix ([place])  09/01/2020 Initial Diagnosis   Malignant neoplasm of cervix ([place])   09/20/2020 - 10/25/2020 Chemotherapy   She received cisplatin chemo with concurrent radiation       09/22/2020 - 12/08/2020 Radiation Therapy   Radiation Treatment Dates: 09/22/2020 through 12/08/2020   Site: Pelvis Technique: IMRT Total Dose (Gy): 45/45 Dose per Fx (Gy): 1.8 Completed Fx: 25/25 Beam Energies: 6x   Site: pelvic boost Technique: IMRT Total Dose (Gy): 7.2/7.2 Dose per Fx (Gy): 1.8 Completed Fx: 4/4 Beam Energies: 6x   Site: Cervix boost Technique: HDR-brachytherapy Total Dose (Gy): 27.5/27.5 Dose per Fx (Gy): 5.5 Completed Fx: 5/5 Beam  Energies: Ir-192     Interval History: Doing well.  Has been traveling a lot this summer.  Took a trip with her grandkids to [facility]  Denies any  vaginal bleeding or discharge.  Using her dilator once or twice a week.  Reports baseline bowel and bladder function.  Endorses a good appetite without nausea or emesis.  Past Medical/Surgical History: Past Medical History:  Diagnosis Date   CKD (chronic kidney disease), stage III (HCC)    Deficiency anemia    B12 def.  treated with b12 injection and has received iron infusion's   GERD (gastroesophageal reflux disease)    History of radiation therapy 09/22/20-11/01/20   Cervix, IMRT    Dr. Gery Pray   History of radiation therapy 11/09/2020-12/08/2020   cervix, intracavitary brachytherapy   Dr Gery Pray   Hypercholesteremia    Hypertension    Hypomagnesemia    Malignant neoplasm cervix [facility]) oncologist--- dr gorsuch/  radiation oncology--- dr Sondra Come   dx 12/ 2021,  Stage IIIC-1,  with pelvic adeopathy;  concurrent chemo/ radiation;  chemo started 09-20-2020 and pt completed IMRT 11-01-2020 / scheduled for high dose brachytherapy to start 11-09-2020   PCOS (polycystic ovarian syndrome)    PMB (postmenopausal bleeding)    Type 2 diabetes mellitus ([place])    followed by pc   (11-04-2020 pt stated does not check blood sugar)   Wears contact lenses     Past Surgical History:  Procedure Laterality Date   CESAREAN SECTION  x2 last one [place]  09/16/2020   OPERATIVE ULTRASOUND N/A 11/09/2020   Procedure: OPERATIVE ULTRASOUND;  Surgeon: Gery Pray, MD;  Location: [facility];  Service: Urology;  Laterality: N/A;   OPERATIVE ULTRASOUND N/A 11/16/2020   Procedure: OPERATIVE ULTRASOUND;  Surgeon: Gery Pray, MD;  Location: [facility];  Service: Urology;  Laterality: N/A;   OPERATIVE ULTRASOUND N/A 11/25/2020   Procedure: OPERATIVE ULTRASOUND;  Surgeon: Gery Pray, MD;  Location[facility];  Service: Urology;  Laterality: N/A;   OPERATIVE ULTRASOUND N/A 11/29/2020   Procedure: OPERATIVE ULTRASOUND;  Surgeon:  Gery Pray, MD;  Location: [facility];  Service: Urology;  Laterality: N/A;   OPERATIVE ULTRASOUND N/A 12/08/2020   Procedure: OPERATIVE ULTRASOUND;  Surgeon: Gery Pray, MD;  Location: [facility];  Service: Urology;  Laterality: N/A;   TANDEM RING INSERTION N/A 11/09/2020   Procedure: TANDEM RING INSERTION;  Surgeon: Gery Pray, MD;  Location: [facility];  Service: Urology;  Laterality: N/A;   TANDEM RING INSERTION N/A 11/16/2020   Procedure: TANDEM RING INSERTION;  Surgeon: Gery Pray, MD;  Location[facility];  Service: Urology;  Laterality: N/A;   TANDEM RING INSERTION N/A 11/25/2020   Procedure: TANDEM RING INSERTION;  Surgeon: Gery Pray, MD;  Location: [facility];  Service: Urology;  Laterality: N/A;   TANDEM RING INSERTION N/A 11/29/2020   Procedure: TANDEM RING INSERTION;  Surgeon: Gery Pray, MD;  Location: [facility];  Service: Urology;  Laterality: N/A;   TANDEM RING INSERTION N/A 12/08/2020   Procedure: TANDEM RING INSERTION;  Surgeon: Gery Pray, MD;  Location: [facility];  Service: Urology;  Laterality: N/A;   WISDOM TOOTH EXTRACTION  1980s    Family History  Problem Relation Age of Onset   Diabetes Father    Brain cancer Son    Breast cancer Neg Hx  Colon cancer Neg Hx    Ovarian cancer Neg Hx    Endometrial cancer Neg Hx    Prostate cancer Neg Hx    Pancreatic cancer Neg Hx     Social History   Socioeconomic History   Marital status: Married    Spouse name: Not on file   Number of children: 2   Years of education: Not on file   Highest education level: Not on file  Occupational History   Occupation: retired Patent examiner  Tobacco Use   Smoking status: Never   Smokeless tobacco: Never  Vaping Use   Vaping Use: Never used  Substance and Sexual Activity   Alcohol use: Never   Drug use: Never   Sexual activity: Not on file   Other Topics Concern   Not on file  Social History Narrative   Not on file   Social Determinants of Health   Financial Resource Strain: Not on file  Food Insecurity: Not on file  Transportation Needs: Not on file  Physical Activity: Not on file  Stress: Not on file  Social Connections: Not on file    Current Medications:  Current Outpatient Medications:    calcium carbonate (TUMS - DOSED IN MG ELEMENTAL CALCIUM) 500 MG chewable tablet, Chew 1 tablet by mouth as needed for indigestion or heartburn., Disp: , Rfl:    ezetimibe (ZETIA) 10 MG tablet, Take 10 mg by mouth daily., Disp: , Rfl:    JANUVIA 50 MG tablet, Take 50 mg by mouth daily., Disp: , Rfl:    lidocaine-prilocaine (EMLA) cream, Apply to affected area once, Disp: 30 g, Rfl: 3   magnesium oxide (MAG-OX) 400 (241.3 Mg) MG tablet, Take 1 tablet (400 mg total) by mouth 2 (two) times daily., Disp: 60 tablet, Rfl: 11   rosuvastatin (CRESTOR) 40 MG tablet, Take 40 mg by mouth at bedtime., Disp: , Rfl:    vitamin B-12 (CYANOCOBALAMIN) 1000 MCG tablet, Take 1,000 mcg by mouth daily., Disp: , Rfl:   Review of Systems: Denies appetite changes, fevers, chills, fatigue, unexplained weight changes. Denies hearing loss, neck lumps or masses, mouth sores, ringing in ears or voice changes. Denies cough or wheezing.  Denies shortness of breath. Denies chest pain or palpitations. Denies leg swelling. Denies abdominal distention, pain, blood in stools, constipation, diarrhea, nausea, vomiting, or early satiety. Denies pain with intercourse, dysuria, frequency, hematuria or incontinence. Denies hot flashes, pelvic pain, vaginal bleeding or vaginal discharge.   Denies joint pain, back pain or muscle pain/cramps. Denies itching, rash, or wounds. Denies dizziness, headaches, numbness or seizures. Denies swollen lymph nodes or glands, denies easy bruising or bleeding. Denies anxiety, depression, confusion, or decreased  concentration.  Physical Exam: BP (!) 175/83 (BP Location: Left Arm, Patient Position: Sitting)   Pulse (!) 105   Temp 98.4 F (36.9 C) (Oral)   Resp 18   Wt 233 lb 8 oz (105.9 kg)   SpO2 100%   BMI 42.71 kg/m  General: Alert, oriented, no acute distress. HEENT: Normocephalic, atraumatic, sclera anicteric. Chest: Clear to auscultation bilaterally.  No wheezes or rhonchi. Cardiovascular: Regular rate and rhythm, no murmurs. Abdomen: Obese, soft, nontender.  Normoactive bowel sounds.  No masses or hepatosplenomegaly appreciated.   Extremities: Grossly normal range of motion.  Warm, well perfused.  No edema bilaterally. Skin: No rashes or lesions noted. Lymphatics: No cervical, supraclavicular, or inguinal adenopathy. GU: Normal appearing external genitalia without erythema, excoriation, or lesions.  Speculum exam reveals moderately atrophic vaginal mucosa,  radiation changes evident, no masses or lesions noted, no atypical vascularity.  Bimanual exam reveals cervix flush with the vaginal apex, smooth, no nodularity or masses.  Rectovaginal exam confirms these findings, no parametrial involvement.  Laboratory & Radiologic Studies: 01/19/22: pap - [facility]  Assessment & Plan: Misty Deleon is a 71 y.o. woman with Stage IIIC1 SCC of the cervix who presents for surveillance visit. Completed definitive chemoRT in 11/2020. NED on posttreatment imaging.   Patient is overall doing very well and is NED on exam.    Patient had a Pap test in May.  There was no HPV performed with this.  She will need Pap and HPV testing on future yearly tests.   Patient would like to keep her port in at this time.  I sent a refill in for her Emla cream.   Stressed the importance of continuing to use her vaginal dilator.  Discussed again the reason that we have her use the vaginal dilator.   Per NCCN surveillance recommendations, we will continue surveillance visits every 3 months, alternating between our office  and radiation oncology.  We discussed signs and symptoms that would be concerning for disease recurrence.  Continue with yearly cotesting.  24 minutes of total time was spent for this patient encounter, including preparation, face-to-face counseling with the patient and coordination of care, and documentation of the encounter.  Jeral Pinch, MD  Division of Gynecologic Oncology  Department of Obstetrics and Gynecology  [facility]

## 2022-06-23 ENCOUNTER — Other Ambulatory Visit: Payer: Medicare PPO

## 2022-06-23 ENCOUNTER — Ambulatory Visit: Payer: Medicare PPO | Admitting: Hematology and Oncology

## 2022-07-06 ENCOUNTER — Telehealth: Payer: Self-pay

## 2022-07-06 ENCOUNTER — Inpatient Hospital Stay: Payer: Medicare PPO

## 2022-07-06 ENCOUNTER — Inpatient Hospital Stay (HOSPITAL_BASED_OUTPATIENT_CLINIC_OR_DEPARTMENT_OTHER): Payer: Medicare PPO | Admitting: Hematology and Oncology

## 2022-07-06 ENCOUNTER — Inpatient Hospital Stay: Payer: Medicare PPO | Attending: Radiation Oncology

## 2022-07-06 ENCOUNTER — Encounter: Payer: Self-pay | Admitting: Hematology and Oncology

## 2022-07-06 VITALS — BP 151/76 | HR 106 | Temp 98.0°F | Resp 16 | Wt 220.2 lb

## 2022-07-06 DIAGNOSIS — E119 Type 2 diabetes mellitus without complications: Secondary | ICD-10-CM

## 2022-07-06 DIAGNOSIS — E1122 Type 2 diabetes mellitus with diabetic chronic kidney disease: Secondary | ICD-10-CM | POA: Insufficient documentation

## 2022-07-06 DIAGNOSIS — Z8541 Personal history of malignant neoplasm of cervix uteri: Secondary | ICD-10-CM | POA: Insufficient documentation

## 2022-07-06 DIAGNOSIS — I129 Hypertensive chronic kidney disease with stage 1 through stage 4 chronic kidney disease, or unspecified chronic kidney disease: Secondary | ICD-10-CM | POA: Insufficient documentation

## 2022-07-06 DIAGNOSIS — N183 Chronic kidney disease, stage 3 unspecified: Secondary | ICD-10-CM | POA: Insufficient documentation

## 2022-07-06 DIAGNOSIS — C538 Malignant neoplasm of overlapping sites of cervix uteri: Secondary | ICD-10-CM

## 2022-07-06 DIAGNOSIS — C539 Malignant neoplasm of cervix uteri, unspecified: Secondary | ICD-10-CM

## 2022-07-06 LAB — CBC WITH DIFFERENTIAL (CANCER CENTER ONLY)
Abs Immature Granulocytes: 0.02 10*3/uL (ref 0.00–0.07)
Basophils Absolute: 0 10*3/uL (ref 0.0–0.1)
Basophils Relative: 0 %
Eosinophils Absolute: 0.1 10*3/uL (ref 0.0–0.5)
Eosinophils Relative: 2 %
HCT: 38.2 % (ref 36.0–46.0)
Hemoglobin: 13.6 g/dL (ref 12.0–15.0)
Immature Granulocytes: 0 %
Lymphocytes Relative: 17 %
Lymphs Abs: 0.8 10*3/uL (ref 0.7–4.0)
MCH: 31.8 pg (ref 26.0–34.0)
MCHC: 35.6 g/dL (ref 30.0–36.0)
MCV: 89.3 fL (ref 80.0–100.0)
Monocytes Absolute: 0.3 10*3/uL (ref 0.1–1.0)
Monocytes Relative: 7 %
Neutro Abs: 3.5 10*3/uL (ref 1.7–7.7)
Neutrophils Relative %: 74 %
Platelet Count: 212 10*3/uL (ref 150–400)
RBC: 4.28 MIL/uL (ref 3.87–5.11)
RDW: 13 % (ref 11.5–15.5)
WBC Count: 4.8 10*3/uL (ref 4.0–10.5)
nRBC: 0 % (ref 0.0–0.2)

## 2022-07-06 LAB — BASIC METABOLIC PANEL - CANCER CENTER ONLY
Anion gap: 7 (ref 5–15)
BUN: 17 mg/dL (ref 8–23)
CO2: 31 mmol/L (ref 22–32)
Calcium: 9.8 mg/dL (ref 8.9–10.3)
Chloride: 97 mmol/L — ABNORMAL LOW (ref 98–111)
Creatinine: 1.19 mg/dL — ABNORMAL HIGH (ref 0.44–1.00)
GFR, Estimated: 49 mL/min — ABNORMAL LOW (ref >60)
Glucose, Bld: 303 mg/dL — ABNORMAL HIGH (ref 70–99)
Potassium: 4.1 mmol/L (ref 3.5–5.1)
Sodium: 135 mmol/L (ref 135–145)

## 2022-07-06 MED ORDER — HEPARIN SOD (PORK) LOCK FLUSH 100 UNIT/ML IV SOLN
500.0000 [IU] | Freq: Once | INTRAVENOUS | Status: AC
  Administered 2022-07-06: 500 [IU]

## 2022-07-06 MED ORDER — SODIUM CHLORIDE 0.9% FLUSH
10.0000 mL | Freq: Once | INTRAVENOUS | Status: AC
  Administered 2022-07-06: 10 mL

## 2022-07-06 NOTE — Telephone Encounter (Signed)
-----   Message from Heath Lark, MD sent at 07/06/2022 11:02 AM EDT ----- Regarding: blood sugar over 300 Not sure what she ate this morning but blood sugar is over 300 I recommend her to increase oral fluids Hopefully she has appt to see her PCP soon

## 2022-07-06 NOTE — Assessment & Plan Note (Signed)
She has intermittent elevated serum creatinine She has multiple risk factors including diabetes and hypertension I recommend her to establish with new primary care doctor for management

## 2022-07-06 NOTE — Assessment & Plan Note (Signed)
She has no recent clinical signs of cancer recurrence Her risk of recurrence of cancer is very high I recommend repeat imaging study in February for further follow-up If her CT imaging is negative, I will get her port removed

## 2022-07-06 NOTE — Assessment & Plan Note (Signed)
She has poorly controlled diabetes We discussed importance of dietary modification I recommend the patient to increase oral fluid intake and follow-up with primary care doctor for management

## 2022-07-06 NOTE — Telephone Encounter (Signed)
Called and left below message. Ask her to call the office back for questions/concerns. Ask her to call PCP for appt soon.

## 2022-07-06 NOTE — Progress Notes (Signed)
[facility] OFFICE PROGRESS NOTE  Patient Care Team: Alvester Chou, NP as PCP - General (Nurse Practitioner)  ASSESSMENT & PLAN:  Cervical cancer [facility]) She has no recent clinical signs of cancer recurrence Her risk of recurrence of cancer is very high I recommend repeat imaging study in February for further follow-up If her CT imaging is negative, I will get her port removed  Diabetes mellitus without complication ([place]) She has poorly controlled diabetes We discussed importance of dietary modification I recommend the patient to increase oral fluid intake and follow-up with primary care doctor for management  Chronic kidney disease (CKD), stage III (moderate) ([place]) She has intermittent elevated serum creatinine She has multiple risk factors including diabetes and hypertension I recommend her to establish with new primary care doctor for management  Orders Placed This Encounter  Procedures   CT CHEST ABDOMEN PELVIS W CONTRAST    Standing Status:   Future    Standing Expiration Date:   07/07/2023    Order Specific Question:   Preferred imaging location?    Answer:   [facility]    Order Specific Question:   Radiology Contrast Protocol - do NOT remove file path    Answer:   [email or web address]   CMP ([place] only)    Standing Status:   Future    Standing Expiration Date:   07/07/2023   CBC with Differential/Platelet    Standing Status:   Standing    Number of Occurrences:   22    Standing Expiration Date:   07/07/2023    All questions were answered. The patient knows to call the clinic with any problems, questions or concerns. The total time spent in the appointment was 30 minutes encounter with patients including review of chart and various tests results, discussions about plan of care and coordination of care plan   Heath Lark, MD 07/06/2022 12:02 PM  INTERVAL HISTORY: Please see below for problem oriented  charting. she returns for surveillance follow-up She denies pelvic pain, vaginal discharge or bleeding Her appetite is fair She denies recent weight loss  REVIEW OF SYSTEMS:   Constitutional: Denies fevers, chills or abnormal weight loss Eyes: Denies blurriness of vision Ears, nose, mouth, throat, and face: Denies mucositis or sore throat Respiratory: Denies cough, dyspnea or wheezes Cardiovascular: Denies palpitation, chest discomfort or lower extremity swelling Gastrointestinal:  Denies nausea, heartburn or change in bowel habits Skin: Denies abnormal skin rashes Lymphatics: Denies new lymphadenopathy or easy bruising Neurological:Denies numbness, tingling or new weaknesses Behavioral/Psych: Mood is stable, no new changes  All other systems were reviewed with the patient and are negative.  I have reviewed the past medical history, past surgical history, social history and family history with the patient and they are unchanged from previous note.  ALLERGIES:  has No Known Allergies.  MEDICATIONS:  Current Outpatient Medications  Medication Sig Dispense Refill   calcium carbonate (TUMS - DOSED IN MG ELEMENTAL CALCIUM) 500 MG chewable tablet Chew 1 tablet by mouth as needed for indigestion or heartburn.     ezetimibe (ZETIA) 10 MG tablet Take 10 mg by mouth daily.     JANUVIA 50 MG tablet Take 50 mg by mouth daily.     lidocaine-prilocaine (EMLA) cream Apply 1 Application topically as needed. Use for port access 30 g 1   magnesium oxide (MAG-OX) 400 (241.3 Mg) MG tablet Take 1 tablet (400 mg total) by mouth 2 (two) times daily. 60 tablet 11   rosuvastatin (CRESTOR)  40 MG tablet Take 40 mg by mouth at bedtime.     vitamin B-12 (CYANOCOBALAMIN) 1000 MCG tablet Take 1,000 mcg by mouth daily.     No current facility-administered medications for this visit.    SUMMARY OF ONCOLOGIC HISTORY: Oncology History  Cervical cancer ([place])  08/13/2020 Pathology Results   Cervical biopsy showed  poorly differentiated carcinoma. 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   08/27/2020 Imaging   MRI pelvis Large cervical mass with pelvic adenopathy, at least T2B N1 disease. Question of involvement of posterior urinary bladder (T4) for which additional images have been requested. Patient will return for additional images to answer this question. (Thinner section axial T2 without fat sat and sagittal T2 weighted imaging also without fat saturation.)   Small field-of-view images show that the parametrial extension comes in very close proximity to the RIGHT ureter.   08/30/2020 Initial Diagnosis   Cervical cancer ([place])   08/30/2020 Cancer Staging   Staging form: Cervix Uteri, AJCC Version 9 - Clinical stage from 08/30/2020: Stage IIIC1 (cT2, cN1, cM0) - Signed by Heath Lark, MD on 08/30/2020   09/01/2020 PET scan   1. Large cervical mass extending into the endometrial canal to the level of the uterine fundus, associated with pelvic adenopathy to the level of the RIGHT common iliac chain. Please refer to MRI for further detail. 2. No signs of solid organ uptake or metastatic disease to the chest. 3. Subcutaneous nodule with marked increased metabolic activity, correlation with direct clinical inspection is suggested to exclude cutaneous neoplasm or subcutaneous nodule in this location. Complicated sebaceous cyst could also potentially have this appearance. 4. Uptake in the anal canal could likely physiologic. Given the intense nature of the uptake would suggest correlation with direct clinical inspection/examination. 5. Uptake in axillary lymph nodes with activity less than mediastinal blood pool likely small reactive lymph nodes, attention on follow-up. Morphologic features also with benign appearance.   09/16/2020 Procedure   Successful placement of a power injectable Port-A-Cath via the right internal jugular vein. The catheter is ready for immediate use.      09/20/2020 - 10/25/2020 Chemotherapy   She received cisplatin chemo with concurrent radiation       09/22/2020 - 12/08/2020 Radiation Therapy   Radiation Treatment Dates: 09/22/2020 through 12/08/2020   Site: Pelvis Technique: IMRT Total Dose (Gy): 45/45 Dose per Fx (Gy): 1.8 Completed Fx: 25/25 Beam Energies: 6x   Site: pelvic boost Technique: IMRT Total Dose (Gy): 7.2/7.2 Dose per Fx (Gy): 1.8 Completed Fx: 4/4 Beam Energies: 6x   Site: Cervix boost Technique: HDR-brachytherapy Total Dose (Gy): 27.5/27.5 Dose per Fx (Gy): 5.5 Completed Fx: 5/5 Beam Energies: Ir-192     03/17/2021 PET scan   1. Mark positive response to therapy. 2. Resolution of metabolic activity within the uterine body. 3. Resolution of size and metabolic activity of pelvic lymph nodes. 4. No evidence of new disease outside of the pelvis.   Malignant neoplasm of cervix ([place])  09/01/2020 Initial Diagnosis   Malignant neoplasm of cervix ([place])   09/20/2020 - 10/25/2020 Chemotherapy   She received cisplatin chemo with concurrent radiation       09/22/2020 - 12/08/2020 Radiation Therapy   Radiation Treatment Dates: 09/22/2020 through 12/08/2020   Site: Pelvis Technique: IMRT Total Dose (Gy): 45/45 Dose per Fx (Gy): 1.8 Completed Fx: 25/25 Beam Energies: 6x   Site: pelvic boost Technique: IMRT Total Dose (Gy): 7.2/7.2 Dose per Fx (Gy): 1.8 Completed Fx: 4/4  Beam Energies: 6x   Site: Cervix boost Technique: HDR-brachytherapy Total Dose (Gy): 27.5/27.5 Dose per Fx (Gy): 5.5 Completed Fx: 5/5 Beam Energies: Ir-192     PHYSICAL EXAMINATION: ECOG PERFORMANCE STATUS: 0 - Asymptomatic  Vitals:   07/06/22 1025  BP: (!) 151/76  Pulse: (!) 106  Resp: 16  Temp: 98 F (36.7 C)  SpO2: 99%   Filed Weights   07/06/22 1025  Weight: 220 lb 3.2 oz (99.9 kg)    GENERAL:alert, no distress and comfortable SKIN: skin color, texture, turgor are normal, no rashes or significant lesions EYES:  normal, Conjunctiva are pink and non-injected, sclera clear OROPHARYNX:no exudate, no erythema and lips, buccal mucosa, and tongue normal  NECK: supple, thyroid normal size, non-tender, without nodularity LYMPH:  no palpable lymphadenopathy in the cervical, axillary or inguinal LUNGS: clear to auscultation and percussion with normal breathing effort HEART: regular rate & rhythm and no murmurs and no lower extremity edema ABDOMEN:abdomen soft, non-tender and normal bowel sounds Musculoskeletal:no cyanosis of digits and no clubbing  NEURO: alert & oriented x 3 with fluent speech, no focal motor/sensory deficits  LABORATORY DATA:  I have reviewed the data as listed    Component Value Date/Time   NA 135 07/06/2022 1003   K 4.1 07/06/2022 1003   CL 97 (L) 07/06/2022 1003   CO2 31 07/06/2022 1003   GLUCOSE 303 (H) 07/06/2022 1003   BUN 17 07/06/2022 1003   CREATININE 1.19 (H) 07/06/2022 1003   CALCIUM 9.8 07/06/2022 1003   PROT 6.8 05/27/2021 1130   ALBUMIN 3.9 05/27/2021 1130   AST 12 (L) 05/27/2021 1130   ALT 9 05/27/2021 1130   ALKPHOS 44 05/27/2021 1130   BILITOT 1.2 05/27/2021 1130   GFRNONAA 49 (L) 07/06/2022 1003    No results found for: "SPEP", "UPEP"  Lab Results  Component Value Date   WBC 4.8 07/06/2022   NEUTROABS 3.5 07/06/2022   HGB 13.6 07/06/2022   HCT 38.2 07/06/2022   MCV 89.3 07/06/2022   PLT 212 07/06/2022      Chemistry      Component Value Date/Time   NA 135 07/06/2022 1003   K 4.1 07/06/2022 1003   CL 97 (L) 07/06/2022 1003   CO2 31 07/06/2022 1003   BUN 17 07/06/2022 1003   CREATININE 1.19 (H) 07/06/2022 1003      Component Value Date/Time   CALCIUM 9.8 07/06/2022 1003   ALKPHOS 44 05/27/2021 1130   AST 12 (L) 05/27/2021 1130   ALT 9 05/27/2021 1130   BILITOT 1.2 05/27/2021 1130

## 2022-07-19 NOTE — Progress Notes (Signed)
Radiation Oncology         [phone] ________________________________  Name: Misty Deleon MRN: [id number]  Date: 07/20/2022  DOB: 06-Jun-1951  Follow-Up Visit Note  CC: Alvester Chou, NP  Alvester Chou, NP  No diagnosis found.  Diagnosis:  Clincal stage IIIC-1 (cT2, cN1, cM0) poorly differentiated cervical cancer   Interval Since Last Radiation: 1 year, 7 months, and 10 days   Radiation Treatment Dates: 09/22/2020 through 12/08/2020   Site: Pelvis Technique: IMRT Total Dose (Gy): 45/45 Dose per Fx (Gy): 1.8 Completed Fx: 25/25 Beam Energies: 6x   Site: pelvic boost Technique: IMRT Total Dose (Gy): 7.2/7.2 Dose per Fx (Gy): 1.8 Completed Fx: 4/4 Beam Energies: 6x   Site: Cervix boost Technique: HDR-brachytherapy Total Dose (Gy): 27.5/27.5 Dose per Fx (Gy): 5.5 Completed Fx: 5/5 Beam Energies: Ir-192  Narrative:  The patient returns today for routine follow-up, she was last seen here for follow-up on 01/19/22. Since her last visit, the patient followed up with Dr. Berline Lopes on 05/01/22. During which time, the patient denied any symptoms concerning for disease recurrence and was NED on exam. She also reported using her vaginal dilator twice weekly. Of note: the patient also opted to keep her port in for the time being.  The patient also followed up with Dr. Alvy Bimler last month on 07/06/22. She was again noted to be NED on exam and denied any concerns. Dr. Alvy Bimler again counseled the patient on the importance of dietary modifications for her poorly controlled diabetes.   ***                             Allergies:  has No Known Allergies.  Meds: Current Outpatient Medications  Medication Sig Dispense Refill   calcium carbonate (TUMS - DOSED IN MG ELEMENTAL CALCIUM) 500 MG chewable tablet Chew 1 tablet by mouth as needed for indigestion or heartburn.     ezetimibe (ZETIA) 10 MG tablet Take 10 mg by mouth daily.     JANUVIA 50 MG tablet Take 50 mg by mouth daily.      lidocaine-prilocaine (EMLA) cream Apply 1 Application topically as needed. Use for port access 30 g 1   magnesium oxide (MAG-OX) 400 (241.3 Mg) MG tablet Take 1 tablet (400 mg total) by mouth 2 (two) times daily. 60 tablet 11   rosuvastatin (CRESTOR) 40 MG tablet Take 40 mg by mouth at bedtime.     vitamin B-12 (CYANOCOBALAMIN) 1000 MCG tablet Take 1,000 mcg by mouth daily.     No current facility-administered medications for this encounter.    Physical Findings: The patient is in no acute distress. Patient is alert and oriented.  vitals were not taken for this visit. .  No significant changes. Lungs are clear to auscultation bilaterally. Heart has regular rate and rhythm. No palpable cervical, supraclavicular, or axillary adenopathy. Abdomen soft, non-tender, normal bowel sounds.  On pelvic examination the external genitalia were unremarkable. A speculum exam was performed. There are no mucosal lesions noted in the vaginal vault. A Pap smear was obtained of the proximal vagina. On bimanual and rectovaginal examination there were no pelvic masses appreciated. ***    Lab Findings: Lab Results  Component Value Date   WBC 4.8 07/06/2022   HGB 13.6 07/06/2022   HCT 38.2 07/06/2022   MCV 89.3 07/06/2022   PLT 212 07/06/2022    Radiographic Findings: No results found.  Impression:  Clincal stage IIIC-1 (cT2, cN1, cM0)  poorly differentiated cervical cancer   The patient is recovering from the effects of radiation.  ***  Plan:  ***   *** minutes of total time was spent for this patient encounter, including preparation, face-to-face counseling with the patient and coordination of care, physical exam, and documentation of the encounter. ____________________________________  Blair Promise, PhD, MD  This document serves as a record of services personally performed by Gery Pray, MD. It was created on his behalf by Roney Mans, a trained medical scribe. The creation of this record  is based on the scribe's personal observations and the provider's statements to them. This document has been checked and approved by the attending provider.

## 2022-07-20 ENCOUNTER — Ambulatory Visit
Admission: RE | Admit: 2022-07-20 | Discharge: 2022-07-20 | Disposition: A | Discharge status: Home / Self Care | Payer: Medicare PPO | Source: Ambulatory Visit | Attending: Radiation Oncology | Admitting: Radiation Oncology

## 2022-07-20 ENCOUNTER — Telehealth: Payer: Self-pay | Admitting: *Deleted

## 2022-07-20 ENCOUNTER — Encounter: Payer: Self-pay | Admitting: Radiation Oncology

## 2022-07-20 DIAGNOSIS — Z7984 Long term (current) use of oral hypoglycemic drugs: Secondary | ICD-10-CM | POA: Diagnosis not present

## 2022-07-20 DIAGNOSIS — Z923 Personal history of irradiation: Secondary | ICD-10-CM | POA: Diagnosis not present

## 2022-07-20 DIAGNOSIS — Z79899 Other long term (current) drug therapy: Secondary | ICD-10-CM | POA: Diagnosis not present

## 2022-07-20 DIAGNOSIS — Z8541 Personal history of malignant neoplasm of cervix uteri: Secondary | ICD-10-CM | POA: Insufficient documentation

## 2022-07-20 DIAGNOSIS — C538 Malignant neoplasm of overlapping sites of cervix uteri: Secondary | ICD-10-CM

## 2022-07-20 NOTE — Progress Notes (Signed)
Misty Deleon is here today for follow up post radiation to the pelvic.  They completed their radiation on: 12/08/2020   Does the patient complain of any of the following:  Pain:no Abdominal bloating: no Diarrhea/Constipation: no Nausea/Vomiting: no Vaginal Discharge: no Blood in Urine or Stool: no Urinary Issues (dysuria/incomplete emptying/ incontinence/ increased frequency/urgency): has urinary frequency due to drinking a lot of water. Does patient report using vaginal dilator 2-3 times a week and/or sexually active 2-3 weeks: was on vacation so has not used it in 2 weeks. Post radiation skin changes: no   Additional comments if applicable:None noted.  BP (!) 181/95 (BP Location: Left Arm, Patient Position: Sitting)   Pulse (!) 104   Temp 98.1 F (36.7 C) (Oral)   Resp 18   Ht 5' 1.5" (1.562 m)   Wt 220 lb (99.8 kg)   SpO2 100%   BMI 40.90 kg/m

## 2022-07-20 NOTE — Telephone Encounter (Signed)
Santiago Glad with Dr Sondra Come from radiation sent an in basket message that the patient need an follow up appt with Dr Berline Lopes for biopsy. Appt made and Santiago Glad will give the information to the patient

## 2022-07-24 ENCOUNTER — Encounter: Payer: Self-pay | Admitting: Gynecologic Oncology

## 2022-07-25 ENCOUNTER — Encounter: Payer: Self-pay | Admitting: Gynecologic Oncology

## 2022-07-25 ENCOUNTER — Ambulatory Visit: Payer: Medicare PPO

## 2022-07-25 ENCOUNTER — Inpatient Hospital Stay: Payer: Medicare PPO | Attending: Radiation Oncology | Admitting: Gynecologic Oncology

## 2022-07-25 VITALS — BP 148/90 | Temp 98.9°F | Wt 216.8 lb

## 2022-07-25 DIAGNOSIS — Z9221 Personal history of antineoplastic chemotherapy: Secondary | ICD-10-CM | POA: Diagnosis not present

## 2022-07-25 DIAGNOSIS — Z8541 Personal history of malignant neoplasm of cervix uteri: Secondary | ICD-10-CM | POA: Diagnosis present

## 2022-07-25 DIAGNOSIS — Z923 Personal history of irradiation: Secondary | ICD-10-CM | POA: Insufficient documentation

## 2022-07-25 DIAGNOSIS — D390 Neoplasm of uncertain behavior of uterus: Secondary | ICD-10-CM | POA: Insufficient documentation

## 2022-07-25 DIAGNOSIS — C539 Malignant neoplasm of cervix uteri, unspecified: Secondary | ICD-10-CM

## 2022-07-25 NOTE — Progress Notes (Signed)
Gynecologic Oncology Return Clinic Visit  07/25/22  Reason for Visit: exam in the setting of cervical cancer history, change on recent exam  Treatment History: Oncology History  Cervical cancer ([place])  08/13/2020 Pathology Results   Cervical biopsy showed poorly differentiated carcinoma. 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   08/27/2020 Imaging   MRI pelvis Large cervical mass with pelvic adenopathy, at least T2B N1 disease. Question of involvement of posterior urinary bladder (T4) for which additional images have been requested. Patient will return for additional images to answer this question. (Thinner section axial T2 without fat sat and sagittal T2 weighted imaging also without fat saturation.)   Small field-of-view images show that the parametrial extension comes in very close proximity to the RIGHT ureter.   08/30/2020 Initial Diagnosis   Cervical cancer ([place])   08/30/2020 Cancer Staging   Staging form: Cervix Uteri, AJCC Version 9 - Clinical stage from 08/30/2020: Stage IIIC1 (cT2, cN1, cM0) - Signed by Heath Lark, MD on 08/30/2020   09/01/2020 PET scan   1. Large cervical mass extending into the endometrial canal to the level of the uterine fundus, associated with pelvic adenopathy to the level of the RIGHT common iliac chain. Please refer to MRI for further detail. 2. No signs of solid organ uptake or metastatic disease to the chest. 3. Subcutaneous nodule with marked increased metabolic activity, correlation with direct clinical inspection is suggested to exclude cutaneous neoplasm or subcutaneous nodule in this location. Complicated sebaceous cyst could also potentially have this appearance. 4. Uptake in the anal canal could likely physiologic. Given the intense nature of the uptake would suggest correlation with direct clinical inspection/examination. 5. Uptake in axillary lymph nodes with activity less than mediastinal blood pool likely  small reactive lymph nodes, attention on follow-up. Morphologic features also with benign appearance.   09/16/2020 Procedure   Successful placement of a power injectable Port-A-Cath via the right internal jugular vein. The catheter is ready for immediate use.     09/20/2020 - 10/25/2020 Chemotherapy   She received cisplatin chemo with concurrent radiation       09/22/2020 - 12/08/2020 Radiation Therapy   Radiation Treatment Dates: 09/22/2020 through 12/08/2020   Site: Pelvis Technique: IMRT Total Dose (Gy): 45/45 Dose per Fx (Gy): 1.8 Completed Fx: 25/25 Beam Energies: 6x   Site: pelvic boost Technique: IMRT Total Dose (Gy): 7.2/7.2 Dose per Fx (Gy): 1.8 Completed Fx: 4/4 Beam Energies: 6x   Site: Cervix boost Technique: HDR-brachytherapy Total Dose (Gy): 27.5/27.5 Dose per Fx (Gy): 5.5 Completed Fx: 5/5 Beam Energies: Ir-192     03/17/2021 PET scan   1. Mark positive response to therapy. 2. Resolution of metabolic activity within the uterine body. 3. Resolution of size and metabolic activity of pelvic lymph nodes. 4. No evidence of new disease outside of the pelvis.   Malignant neoplasm of cervix ([place])  09/01/2020 Initial Diagnosis   Malignant neoplasm of cervix ([place])   09/20/2020 - 10/25/2020 Chemotherapy   She received cisplatin chemo with concurrent radiation       09/22/2020 - 12/08/2020 Radiation Therapy   Radiation Treatment Dates: 09/22/2020 through 12/08/2020   Site: Pelvis Technique: IMRT Total Dose (Gy): 45/45 Dose per Fx (Gy): 1.8 Completed Fx: 25/25 Beam Energies: 6x   Site: pelvic boost Technique: IMRT Total Dose (Gy): 7.2/7.2 Dose per Fx (Gy): 1.8 Completed Fx: 4/4 Beam Energies: 6x   Site: Cervix boost Technique: HDR-brachytherapy Total Dose (Gy): 27.5/27.5 Dose per Fx (Gy): 5.5  Completed Fx: 5/5 Beam Energies: Ir-192    PET on 03/17/21: 1. Mark positive response to therapy. 2. Resolution of metabolic activity within the uterine body. 3.  Resolution of size and metabolic activity of pelvic lymph nodes. 4. No evidence of new disease outside of the pelvis.  Interval History: Patient was noted to have a small nodule near the cervical os that was somewhat friable on her exam with Dr. Sondra Come last week.  Patient reports she has been doing well.  Denies any vaginal bleeding or discharge.  Denies any pelvic or abdominal pain.  Reports baseline bowel bladder function.  Has been mostly using her vaginal dilator but had to stop for about 2 weeks secondary to traveling.  Past Medical/Surgical History: Past Medical History:  Diagnosis Date   CKD (chronic kidney disease), stage III (HCC)    Deficiency anemia    B12 def.  treated with b12 injection and has received iron infusion's   GERD (gastroesophageal reflux disease)    History of radiation therapy 09/22/20-11/01/20   Cervix, IMRT    Dr. Gery Pray   History of radiation therapy 11/09/2020-12/08/2020   cervix, intracavitary brachytherapy   Dr Gery Pray   Hypercholesteremia    Hypertension    Hypomagnesemia    Malignant neoplasm cervix [facility]) oncologist--- dr gorsuch/  radiation oncology--- dr Sondra Come   dx 12/ 2021,  Stage IIIC-1,  with pelvic adeopathy;  concurrent chemo/ radiation;  chemo started 09-20-2020 and pt completed IMRT 11-01-2020 / scheduled for high dose brachytherapy to start 11-09-2020   PCOS (polycystic ovarian syndrome)    PMB (postmenopausal bleeding)    Type 2 diabetes mellitus ([place])    followed by pc   (11-04-2020 pt stated does not check blood sugar)   Wears contact lenses     Past Surgical History:  Procedure Laterality Date   CESAREAN SECTION  x2 last one [place]  09/16/2020   OPERATIVE ULTRASOUND N/A 11/09/2020   Procedure: OPERATIVE ULTRASOUND;  Surgeon: Gery Pray, MD;  Location: [place];  Service: Urology;  Laterality: N/A;   OPERATIVE ULTRASOUND N/A 11/16/2020   Procedure: OPERATIVE ULTRASOUND;   Surgeon: Gery Pray, MD;  Location: [facility];  Service: Urology;  Laterality: N/A;   OPERATIVE ULTRASOUND N/A 11/25/2020   Procedure: OPERATIVE ULTRASOUND;  Surgeon: Gery Pray, MD;  Location: [facility];  Service: Urology;  Laterality: N/A;   OPERATIVE ULTRASOUND N/A 11/29/2020   Procedure: OPERATIVE ULTRASOUND;  Surgeon: Gery Pray, MD;  Location: [facility];  Service: Urology;  Laterality: N/A;   OPERATIVE ULTRASOUND N/A 12/08/2020   Procedure: OPERATIVE ULTRASOUND;  Surgeon: Gery Pray, MD;  Location: [facility];  Service: Urology;  Laterality: N/A;   TANDEM RING INSERTION N/A 11/09/2020   Procedure: TANDEM RING INSERTION;  Surgeon: Gery Pray, MD;  Location: [facility];  Service: Urology;  Laterality: N/A;   TANDEM RING INSERTION N/A 11/16/2020   Procedure: TANDEM RING INSERTION;  Surgeon: Gery Pray, MD;  Location: [facility];  Service: Urology;  Laterality: N/A;   TANDEM RING INSERTION N/A 11/25/2020   Procedure: TANDEM RING INSERTION;  Surgeon: Gery Pray, MD;  Location: [facility];  Service: Urology;  Laterality: N/A;   TANDEM RING INSERTION N/A 11/29/2020   Procedure: TANDEM RING INSERTION;  Surgeon: Gery Pray, MD;  Location: [facility];  Service: Urology;  Laterality: N/A;   TANDEM RING INSERTION N/A  12/08/2020   Procedure: TANDEM RING INSERTION;  Surgeon: Gery Pray, MD;  Location: [facility];  Service: Urology;  Laterality: N/A;   WISDOM TOOTH EXTRACTION  1980s    Family History  Problem Relation Age of Onset   Diabetes Father    Brain cancer Son    Breast cancer Neg Hx    Colon cancer Neg Hx    Ovarian cancer Neg Hx    Endometrial cancer Neg Hx    Prostate cancer Neg Hx    Pancreatic cancer Neg Hx     Social History   Socioeconomic History   Marital status: Married    Spouse name: Not on file   Number of  children: 2   Years of education: Not on file   Highest education level: Not on file  Occupational History   Occupation: retired Patent examiner  Tobacco Use   Smoking status: Never   Smokeless tobacco: Never  Vaping Use   Vaping Use: Never used  Substance and Sexual Activity   Alcohol use: Never   Drug use: Never   Sexual activity: Not on file  Other Topics Concern   Not on file  Social History Narrative   Not on file   Social Determinants of Health   Financial Resource Strain: Not on file  Food Insecurity: Not on file  Transportation Needs: Not on file  Physical Activity: Not on file  Stress: Not on file  Social Connections: Not on file    Current Medications:  Current Outpatient Medications:    calcium carbonate (TUMS - DOSED IN MG ELEMENTAL CALCIUM) 500 MG chewable tablet, Chew 1 tablet by mouth as needed for indigestion or heartburn., Disp: , Rfl:    ezetimibe (ZETIA) 10 MG tablet, Take 10 mg by mouth daily., Disp: , Rfl:    glimepiride (AMARYL) 1 MG tablet, Take 1 mg by mouth 2 (two) times daily., Disp: , Rfl:    JANUVIA 50 MG tablet, Take 50 mg by mouth daily., Disp: , Rfl:    lidocaine-prilocaine (EMLA) cream, Apply 1 Application topically as needed. Use for port access, Disp: 30 g, Rfl: 1   lisinopril (ZESTRIL) 5 MG tablet, Take 5 mg by mouth daily., Disp: , Rfl:    magnesium oxide (MAG-OX) 400 (241.3 Mg) MG tablet, Take 1 tablet (400 mg total) by mouth 2 (two) times daily., Disp: 60 tablet, Rfl: 11   rosuvastatin (CRESTOR) 40 MG tablet, Take 40 mg by mouth at bedtime., Disp: , Rfl:    vitamin B-12 (CYANOCOBALAMIN) 1000 MCG tablet, Take 1,000 mcg by mouth daily., Disp: , Rfl:   Review of Systems: Denies appetite changes, fevers, chills, fatigue, unexplained weight changes. Denies hearing loss, neck lumps or masses, mouth sores, ringing in ears or voice changes. Denies cough or wheezing.  Denies shortness of breath. Denies chest pain or palpitations. Denies  leg swelling. Denies abdominal distention, pain, blood in stools, constipation, diarrhea, nausea, vomiting, or early satiety. Denies pain with intercourse, dysuria, frequency, hematuria or incontinence. Denies hot flashes, pelvic pain, vaginal bleeding or vaginal discharge.   Denies joint pain, back pain or muscle pain/cramps. Denies itching, rash, or wounds. Denies dizziness, headaches, numbness or seizures. Denies swollen lymph nodes or glands, denies easy bruising or bleeding. Denies anxiety, depression, confusion, or decreased concentration.  Physical Exam: BP (!) 148/90 Comment: manual recheck, Dr. Berline Lopes notified  Temp 98.9 F (37.2 C) (Oral)   Wt 216 lb 12.8 oz (98.3 kg)   SpO2 100%  BMI 40.30 kg/m  General: Alert, oriented, no acute distress. HEENT: Normocephalic, atraumatic, sclera anicteric. Chest: Clear to auscultation bilaterally.  No wheezes or rhonchi. Cardiovascular: Regular rate and rhythm, no murmurs. Abdomen: Obese, soft, nontender.  Normoactive bowel sounds.  No masses or hepatosplenomegaly appreciated.   Extremities: Grossly normal range of motion.  Warm, well perfused.  No edema bilaterally. Skin: No rashes or lesions noted. Lymphatics: No cervical, supraclavicular, or inguinal adenopathy. GU: Normal appearing external genitalia without erythema, excoriation, or lesions.  Speculum exam reveals moderately atrophic vaginal mucosa, radiation changes evident.  On the posterior aspect of the cervix, there is a pinpoint area that looks slightly raised and erythematous.  This is not friable, but the os itself seems somewhat friable.  Biopsy taken as noted below.  On bimanual exam, the central portion of the cervix feels more firm than what I remember from prior exams.  No nodularity appreciated or parametrial involvement appreciated on rectovaginal exam.  Cervical biopsy procedure Preoperative diagnosis: Small cervical lesion Postoperative diagnosis: Same as  above Physician: Berline Lopes MD Estimated blood loss: Minimal Specimens: cervix biopsy Procedure: After the procedure was discussed with the patient including risks and benefits, she gave verbal consent.  She was then placed in dorsolithotomy position and a speculum was placed in the vagina.  Once the cervix was well visualized it was cleansed with Betadine x3.  Tischler forceps were used to take a small biopsy from the posterior cervix with some difficulty.  Silver nitrate was used to achieve hemostasis of the biopsy site.  This was placed in formalin.  Overall the patient tolerated the procedure well.  All instruments were removed from the vagina.  Laboratory & Radiologic Studies: None new  Assessment & Plan: Misty Deleon is a 71 y.o. woman with Stage IIIC1 SCC of the cervix who presents for surveillance visit. Completed definitive chemoRT in 11/2020. NED on posttreatment imaging.   Patient is overall doing very well and asymptomatic.  Biopsy taken from a small lesion on the cervix that I suspect is not cancer recurrence.  I am a little concerned about the firmness of the central part of her cervix and have recommended that we pursue imaging in addition to the biopsy.  PET scan ordered.  I will call her with the results of the biopsy and PET when I have them.  If biopsy and imaging are negative for recurrent disease, we will continue with visits every 3 months.  She will see me as well as Dr. Alvy Bimler in February.  Patient had a Pap test in May.  There was no HPV performed with this.  She will need Pap and HPV testing on future yearly tests.   I stressed the importance of continuing to use her vaginal dilator.  Discussed again the reason that we have her use the vaginal dilator.   Per NCCN surveillance recommendations, we will continue surveillance visits every 3 months, alternating between our office and radiation oncology.  We discussed signs and symptoms that would be concerning for disease  recurrence.  Continue with yearly cotesting.  22 minutes of total time was spent for this patient encounter, including preparation, face-to-face counseling with the patient and coordination of care, and documentation of the encounter.  Jeral Pinch, MD  Division of Gynecologic Oncology  Department of Obstetrics and Gynecology  [facility]

## 2022-07-25 NOTE — Patient Instructions (Addendum)
I will let you know when I get the biopsy results back.  I will also call you when I get your scan.  I will plan to see you in 3 months if biopsy is negative.

## 2022-07-27 LAB — SURGICAL PATHOLOGY

## 2022-08-01 ENCOUNTER — Other Ambulatory Visit: Payer: Medicare PPO

## 2022-08-10 ENCOUNTER — Ambulatory Visit (HOSPITAL_COMMUNITY)
Admission: RE | Admit: 2022-08-10 | Discharge: 2022-08-10 | Disposition: A | Discharge status: Home / Self Care | Payer: Medicare PPO | Source: Ambulatory Visit | Attending: Gynecologic Oncology | Admitting: Gynecologic Oncology

## 2022-08-10 DIAGNOSIS — C539 Malignant neoplasm of cervix uteri, unspecified: Secondary | ICD-10-CM | POA: Insufficient documentation

## 2022-08-10 LAB — GLUCOSE, CAPILLARY: Glucose-Capillary: 143 mg/dL — ABNORMAL HIGH (ref 70–99)

## 2022-08-10 MED ORDER — FLUDEOXYGLUCOSE F - 18 (FDG) INJECTION
11.8000 | Freq: Once | INTRAVENOUS | Status: AC
  Administered 2022-08-10: 10.8 via INTRAVENOUS

## 2022-08-11 ENCOUNTER — Telehealth: Payer: Self-pay | Admitting: Surgery

## 2022-08-11 ENCOUNTER — Encounter: Payer: Self-pay | Admitting: General Practice

## 2022-08-11 ENCOUNTER — Telehealth: Payer: Self-pay | Admitting: Gynecologic Oncology

## 2022-08-11 DIAGNOSIS — R918 Other nonspecific abnormal finding of lung field: Secondary | ICD-10-CM

## 2022-08-11 DIAGNOSIS — C7951 Secondary malignant neoplasm of bone: Secondary | ICD-10-CM

## 2022-08-11 NOTE — Telephone Encounter (Signed)
Received call from [facility] with PET scan results from 11/30. Received from [place]:  There was an incidental finding. Multi-level degenerative changes in the spine with mild mutli focal degenerative joint disease. New hypermetabolic bilateral pulmonary nodules. Hypermetabolic cervical and hilar lymph nodes consistent with metastatic disease.   Information relayed to Dr Berline Lopes.

## 2022-08-11 NOTE — Progress Notes (Signed)
Misty Mckusick, DO  Devyne Hauger, Marva Panda, [place]; P Ir Procedure Requests OK for CT guided pulmonary nodule biopsy.    Presumed mets from prior malignancy.  Discussed with Dr. Berline Lopes.  Best for [employer] day if possible, though any can perform.  [place]  Consider anterior approach to the LUL image 17/65       Previous Messages    ----- Message ----- From: Allen Kell, NT Sent: 08/11/2022   1:26 PM EST To: Ir Procedure Requests Subject: Ct Biopsy                                      Procedure: CT Biopsy  Reason: new FDG-avid pulmonary nodules, history of cervical cancer  History: NM PET in chart  Provider: Lafonda Mosses, MD  Contact: [phone]

## 2022-08-11 NOTE — Telephone Encounter (Signed)
I called the patient to discuss PET scan results.  Results are concerning for metastatic disease.  While this certainly may be cervical cancer, given distribution with no FDG avid disease noted in the abdomen or pelvis, I have recommended biopsy of one of the pulmonary nodules.  I spoke with IR this morning and they believe this is feasible.  Patient amenable, order placed.  Jeral Pinch MD Gynecologic Oncology

## 2022-08-23 ENCOUNTER — Other Ambulatory Visit: Payer: Self-pay | Admitting: Radiology

## 2022-08-23 DIAGNOSIS — R911 Solitary pulmonary nodule: Secondary | ICD-10-CM

## 2022-08-24 ENCOUNTER — Ambulatory Visit (HOSPITAL_COMMUNITY)
Admission: RE | Admit: 2022-08-24 | Discharge: 2022-08-24 | Disposition: A | Discharge status: Home / Self Care | Payer: Medicare PPO | Source: Ambulatory Visit | Attending: Interventional Radiology | Admitting: Interventional Radiology

## 2022-08-24 ENCOUNTER — Ambulatory Visit (HOSPITAL_COMMUNITY)
Admission: RE | Admit: 2022-08-24 | Discharge: 2022-08-24 | Disposition: A | Discharge status: Home / Self Care | Payer: Medicare PPO | Source: Ambulatory Visit | Attending: Gynecologic Oncology | Admitting: Gynecologic Oncology

## 2022-08-24 ENCOUNTER — Encounter (HOSPITAL_COMMUNITY): Payer: Self-pay

## 2022-08-24 VITALS — BP 140/71 | HR 78 | Temp 98.5°F | Resp 15 | Ht 61.0 in | Wt 215.0 lb

## 2022-08-24 DIAGNOSIS — Z8541 Personal history of malignant neoplasm of cervix uteri: Secondary | ICD-10-CM | POA: Insufficient documentation

## 2022-08-24 DIAGNOSIS — R918 Other nonspecific abnormal finding of lung field: Secondary | ICD-10-CM | POA: Insufficient documentation

## 2022-08-24 DIAGNOSIS — C7989 Secondary malignant neoplasm of other specified sites: Secondary | ICD-10-CM | POA: Insufficient documentation

## 2022-08-24 DIAGNOSIS — Z9889 Other specified postprocedural states: Secondary | ICD-10-CM

## 2022-08-24 DIAGNOSIS — R911 Solitary pulmonary nodule: Secondary | ICD-10-CM

## 2022-08-24 DIAGNOSIS — I700 Atherosclerosis of aorta: Secondary | ICD-10-CM | POA: Diagnosis not present

## 2022-08-24 LAB — CBC
HCT: 37 % (ref 36.0–46.0)
Hemoglobin: 12.8 g/dL (ref 12.0–15.0)
MCH: 31.6 pg (ref 26.0–34.0)
MCHC: 34.6 g/dL (ref 30.0–36.0)
MCV: 91.4 fL (ref 80.0–100.0)
Platelets: 215 10*3/uL (ref 150–400)
RBC: 4.05 MIL/uL (ref 3.87–5.11)
RDW: 13.6 % (ref 11.5–15.5)
WBC: 5.4 10*3/uL (ref 4.0–10.5)
nRBC: 0 % (ref 0.0–0.2)

## 2022-08-24 LAB — GLUCOSE, CAPILLARY: Glucose-Capillary: 137 mg/dL — ABNORMAL HIGH (ref 70–99)

## 2022-08-24 LAB — PROTIME-INR
INR: 1.1 (ref 0.8–1.2)
Prothrombin Time: 13.6 seconds (ref 11.4–15.2)

## 2022-08-24 MED ORDER — MIDAZOLAM HCL 2 MG/2ML IJ SOLN
INTRAMUSCULAR | Status: AC | PRN
  Administered 2022-08-24 (×2): 1 mg via INTRAVENOUS

## 2022-08-24 MED ORDER — FENTANYL CITRATE (PF) 100 MCG/2ML IJ SOLN
INTRAMUSCULAR | Status: AC
  Filled 2022-08-24: qty 2

## 2022-08-24 MED ORDER — HEPARIN SOD (PORK) LOCK FLUSH 100 UNIT/ML IV SOLN
500.0000 [IU] | INTRAVENOUS | Status: AC | PRN
  Administered 2022-08-24: 500 [IU]
  Filled 2022-08-24: qty 5

## 2022-08-24 MED ORDER — FENTANYL CITRATE (PF) 100 MCG/2ML IJ SOLN
INTRAMUSCULAR | Status: AC | PRN
  Administered 2022-08-24 (×2): 50 ug via INTRAVENOUS

## 2022-08-24 MED ORDER — LIDOCAINE HCL 1 % IJ SOLN: 10.0000 mL | Freq: Once | INTRAMUSCULAR | Status: DC

## 2022-08-24 MED ORDER — MIDAZOLAM HCL 2 MG/2ML IJ SOLN
INTRAMUSCULAR | Status: AC
  Filled 2022-08-24: qty 2

## 2022-08-24 MED ORDER — SODIUM CHLORIDE 0.9 % IV SOLN: INTRAVENOUS | Status: DC

## 2022-08-24 NOTE — Progress Notes (Signed)
Patient was given discharge instructions. She verbalized understanding. 

## 2022-08-24 NOTE — Consult Note (Signed)
Chief Complaint: Patient was seen in consultation today for pulmonary nodule biopsy at the request of Tucker,Katherine R  Referring Physician(s): Lafonda Mosses  Supervising Physician: Mir, Sharen Heck  Patient Status: [facility] - Out-pt  History of Present Illness: Misty Deleon is a 71 y.o. female   Hx Cervical cancer 2021 Follows with Dr Dorian Pod Asymptomatic -- doing well Follow up PET 08/10/22: IMPRESSION: 1. New hypermetabolic bilateral pulmonary nodules and hypermetabolic cervical and hilar lymph nodes, consistent with metastatic disease. 2. Hypermetabolic focus in the anterior right third costochondral junction without discrete CT correlate is also compatible with metastatic disease. 3. Prior hysterectomy without suspicious hypermetabolic nodularity along the vaginal cuff. 4.  Aortic Atherosclerosis (ICD10-I70.0).  Request for biopsy of lung nodule Dr Earleen Newport has reviewed imaging and approves procedure Scheduled today      Past Medical History:  Diagnosis Date   CKD (chronic kidney disease), stage III ([place])    Deficiency anemia    B12 def.  treated with b12 injection and has received iron infusion's   GERD (gastroesophageal reflux disease)    History of radiation therapy 09/22/20-11/01/20   Cervix, IMRT    Dr. Gery Pray   History of radiation therapy 11/09/2020-12/08/2020   cervix, intracavitary brachytherapy   Dr Gery Pray   Hypercholesteremia    Hypertension    Hypomagnesemia    Malignant neoplasm cervix [facility]) oncologist--- dr gorsuch/  radiation oncology--- dr Sondra Come   dx 12/ 2021,  Stage IIIC-1,  with pelvic adeopathy;  concurrent chemo/ radiation;  chemo started 09-20-2020 and pt completed IMRT 11-01-2020 / scheduled for high dose brachytherapy to start 11-09-2020   PCOS (polycystic ovarian syndrome)    PMB (postmenopausal bleeding)    Type 2 diabetes mellitus ([place])    followed by pc   (11-04-2020 pt stated does not check blood sugar)   Wears  contact lenses     Past Surgical History:  Procedure Laterality Date   CESAREAN SECTION  x2 last one [place]  09/16/2020   OPERATIVE ULTRASOUND N/A 11/09/2020   Procedure: OPERATIVE ULTRASOUND;  Surgeon: Gery Pray, MD;  Location: [place];  Service: Urology;  Laterality: N/A;   OPERATIVE ULTRASOUND N/A 11/16/2020   Procedure: OPERATIVE ULTRASOUND;  Surgeon: Gery Pray, MD;  Location: [facility];  Service: Urology;  Laterality: N/A;   OPERATIVE ULTRASOUND N/A 11/25/2020   Procedure: OPERATIVE ULTRASOUND;  Surgeon: Gery Pray, MD;  Location: [facility];  Service: Urology;  Laterality: N/A;   OPERATIVE ULTRASOUND N/A 11/29/2020   Procedure: OPERATIVE ULTRASOUND;  Surgeon: Gery Pray, MD;  Location: [facility];  Service: Urology;  Laterality: N/A;   OPERATIVE ULTRASOUND N/A 12/08/2020   Procedure: OPERATIVE ULTRASOUND;  Surgeon: Gery Pray, MD;  Location: [facility];  Service: Urology;  Laterality: N/A;   TANDEM RING INSERTION N/A 11/09/2020   Procedure: TANDEM RING INSERTION;  Surgeon: Gery Pray, MD;  Location: [facility];  Service: Urology;  Laterality: N/A;   TANDEM RING INSERTION N/A 11/16/2020   Procedure: TANDEM RING INSERTION;  Surgeon: Gery Pray, MD;  Location: [facility];  Service: Urology;  Laterality: N/A;   TANDEM RING INSERTION N/A 11/25/2020   Procedure: TANDEM RING INSERTION;  Surgeon: Gery Pray, MD;  Location: [facility];  Service: Urology;  Laterality: N/A;   TANDEM RING INSERTION N/A 11/29/2020   Procedure: TANDEM RING INSERTION;  Surgeon: Gery Pray, MD;  Location:  [place];  Service: Urology;  Laterality: N/A;   TANDEM RING INSERTION N/A 12/08/2020   Procedure: TANDEM RING INSERTION;  Surgeon: Gery Pray, MD;  Location: [facility];  Service: Urology;  Laterality:  N/A;   WISDOM TOOTH EXTRACTION  1980s    Allergies: Patient has no known allergies.  Medications: Prior to Admission medications   Medication Sig Start Date End Date Taking? Authorizing Provider  calcium carbonate (TUMS - DOSED IN MG ELEMENTAL CALCIUM) 500 MG chewable tablet Chew 1 tablet by mouth daily as needed for indigestion or heartburn.   Yes [provider]  ezetimibe (ZETIA) 10 MG tablet Take 10 mg by mouth in the morning.   Yes [provider]  glimepiride (AMARYL) 1 MG tablet Take 1 mg by mouth 2 (two) times daily. 07/07/22  Yes [provider]  lidocaine-prilocaine (EMLA) cream Apply 1 Application topically as needed. Use for port access 05/01/22  Yes Lafonda Mosses, MD  lisinopril (ZESTRIL) 5 MG tablet Take 5 mg by mouth in the morning. 06/28/22  Yes [provider]  magnesium oxide (MAG-OX) 400 (241.3 Mg) MG tablet Take 1 tablet (400 mg total) by mouth 2 (two) times daily. 10/01/20  Yes Gorsuch, Ni, MD  rosuvastatin (CRESTOR) 40 MG tablet Take 40 mg by mouth at bedtime.   Yes [provider]  sitaGLIPtin (JANUVIA) 100 MG tablet Take 50 mg by mouth in the morning. 03/30/22  Yes [provider]  vitamin B-12 (CYANOCOBALAMIN) 1000 MCG tablet Take 1,000 mcg by mouth in the morning.   Yes [provider]     Family History  Problem Relation Age of Onset   Diabetes Father    Brain cancer Son    Breast cancer Neg Hx    Colon cancer Neg Hx    Ovarian cancer Neg Hx    Endometrial cancer Neg Hx    Prostate cancer Neg Hx    Pancreatic cancer Neg Hx     Social History   Socioeconomic History   Marital status: Married    Spouse name: Not on file   Number of children: 2   Years of education: Not on file   Highest education level: Not on file  Occupational History   Occupation: retired Patent examiner  Tobacco Use   Smoking status: Never   Smokeless tobacco: Never  Vaping Use   Vaping Use: Never used   Substance and Sexual Activity   Alcohol use: Never   Drug use: Never   Sexual activity: Not on file  Other Topics Concern   Not on file  Social History Narrative   Not on file   Social Determinants of Health   Financial Resource Strain: Not on file  Food Insecurity: Not on file  Transportation Needs: Not on file  Physical Activity: Not on file  Stress: Not on file  Social Connections: Not on file    Review of Systems: A 12 point ROS discussed and pertinent positives are indicated in the HPI above.  All other systems are negative.  Review of Systems  Constitutional:  Negative for activity change, fever and unexpected weight change.  Respiratory:  Negative for cough and shortness of breath.   Cardiovascular:  Negative for chest pain.  Gastrointestinal:  Negative for abdominal pain and nausea.  Neurological:  Negative for weakness.  Psychiatric/Behavioral:  Negative for behavioral problems and confusion.     Vital Signs: BP (!) 154/89   Pulse (!) 101  Temp 98.5 F (36.9 C) (Oral)   Resp 16   Ht '5\' 1"'$  (1.549 m)   Wt 215 lb (97.5 kg)   SpO2 100%   BMI 40.62 kg/m    Physical Exam Vitals reviewed.  HENT:     Mouth/Throat:     Mouth: Mucous membranes are moist.  Cardiovascular:     Rate and Rhythm: Normal rate and regular rhythm.     Heart sounds: Normal heart sounds.  Pulmonary:     Effort: Pulmonary effort is normal.     Breath sounds: Normal breath sounds.  Abdominal:     Palpations: Abdomen is soft.  Musculoskeletal:        General: Normal range of motion.  Skin:    General: Skin is warm.  Neurological:     Mental Status: She is alert and oriented to person, place, and time.  Psychiatric:        Behavior: Behavior normal.     Imaging: NM PET Image Restage (PS) Skull Base to Thigh (F-18 FDG)  Result Date: 08/10/2022 CLINICAL DATA:  Subsequent treatment strategy for cervical cancer. EXAM: NUCLEAR MEDICINE PET SKULL BASE TO THIGH TECHNIQUE: 10.82 mCi  F-18 FDG was injected intravenously. Full-ring PET imaging was performed from the skull base to thigh after the radiotracer. CT data was obtained and used for attenuation correction and anatomic localization. Fasting blood glucose: 143 mg/dl COMPARISON:  Multiple priors including most recent PET-CT March 17, 2021 FINDINGS: Mediastinal blood pool activity: SUV max 2.7 Liver activity: SUV max NA NECK: No hypermetabolic cervical adenopathy. Incidental CT findings: None. CHEST: New bilateral hypermetabolic pulmonary nodules. For reference: -hypermetabolic left upper lobe pulmonary nodule measures 8 mm on image 17/7 with a max SUV of 5.9 -hypermetabolic right upper lobe pulmonary nodule measures 8 mm on image 15/7 with a max SUV of 4.5. Hypermetabolic cervical and hilar lymph nodes.  For reference: -hypermetabolic left hilar lymph node is not well seen on noncontrast enhanced CT but has a max SUV of 3.7. -hypermetabolic precarinal lymph node measures 12 mm in short axis on image 67/4 with a max SUV of 16.6. Incidental CT findings: Right chest Port-A-Cath with tip at the superior cavoatrial junction. Calcified left hilar lymph nodes. Aortic atherosclerosis. Small hiatal hernia. ABDOMEN/PELVIS: Prior hysterectomy without suspicious hypermetabolic nodularity along the vaginal cuff. No abnormal hypermetabolic activity within the liver, pancreas, adrenal glands, or spleen. No hypermetabolic lymph nodes in the abdomen or pelvis. Incidental CT findings: Calcified splenic granulomata. Nonobstructive right renal stones measure up to 8 mm. Aortic atherosclerosis. Colonic diverticulosis without findings of acute diverticulitis. SKELETON: Hypermetabolic focus in the anterior right third costochondral junction without discrete CT correlate with a max SUV of 9.9 Incidental CT findings: Multilevel degenerative changes spine with mild multifocal degenerative joint disease. IMPRESSION: 1. New hypermetabolic bilateral pulmonary nodules and  hypermetabolic cervical and hilar lymph nodes, consistent with metastatic disease. 2. Hypermetabolic focus in the anterior right third costochondral junction without discrete CT correlate is also compatible with metastatic disease. 3. Prior hysterectomy without suspicious hypermetabolic nodularity along the vaginal cuff. 4.  Aortic Atherosclerosis (ICD10-I70.0). These results will be called to the ordering clinician or representative by the Radiologist Assistant, and communication documented in the PACS or [employer]. Electronically Signed   By: Dahlia Bailiff M.D.   On: 08/10/2022 17:06    Labs:  CBC: Recent Labs    07/06/22 1003  WBC 4.8  HGB 13.6  HCT 38.2  PLT 212    COAGS: No results  for input(s): "INR", "APTT" in the last 8760 hours.  BMP: Recent Labs    07/06/22 1003  NA 135  K 4.1  CL 97*  CO2 31  GLUCOSE 303*  BUN 17  CALCIUM 9.8  CREATININE 1.19*  GFRNONAA 49*    LIVER FUNCTION TESTS: No results for input(s): "BILITOT", "AST", "ALT", "ALKPHOS", "PROT", "ALBUMIN" in the last 8760 hours.  TUMOR MARKERS: No results for input(s): "AFPTM", "CEA", "CA199", "CHROMGRNA" in the last 8760 hours.  Assessment and Plan:  Hx Cervical cancer New PET + Lung nodules and lymphadenopathy Scheduled now for lung nodule biopsy Risks and benefits of CT guided lung nodule biopsy was discussed with the patient including, but not limited to bleeding, hemoptysis, respiratory failure requiring intubation, infection, pneumothorax requiring chest tube placement, stroke from air embolism or even death.  All of the patient's questions were answered and the patient is agreeable to proceed.  Consent signed and in chart.  Thank you for this interesting consult.  I greatly enjoyed meeting TONDALAYA PERREN and look forward to participating in their care.  A copy of this report was sent to the requesting provider on this date.  Electronically Signed: Lavonia Drafts, PA-C 08/24/2022,  7:00 AM   I spent a total of    25 Minutes in face to face in clinical consultation, greater than 50% of which was counseling/coordinating care for lung nodule biopsy

## 2022-08-24 NOTE — Procedures (Signed)
Interventional Radiology Procedure Note  Procedure: CT guided biopsy of right chest wall nodule  Indication: Hx of Cervical malignancy with new FDG avid pulmonary nodules and right chest wall lesion   Findings: Please refer to procedural dictation for full description.  Complications: None  EBL: < 10 mL  Misty Roux, MD [phone]

## 2022-08-28 ENCOUNTER — Ambulatory Visit (HOSPITAL_COMMUNITY): Payer: Medicare PPO

## 2022-08-29 ENCOUNTER — Telehealth: Payer: Self-pay | Admitting: Gynecologic Oncology

## 2022-08-29 ENCOUNTER — Telehealth: Payer: Self-pay | Admitting: Oncology

## 2022-08-29 NOTE — Telephone Encounter (Signed)
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 She is scheduled with Dr. Alvy Bimler on Friday.  Will ask pathology to add ER/PR, PDL1, and MMR.  Jeral Pinch MD Gynecologic Oncology

## 2022-08-29 NOTE — Telephone Encounter (Signed)
Misty Deleon and scheduled an appointment with Dr. Alvy Bimler on 09/01/22 at 1:15 (12:45 arrival).  She verbalized understanding of appointment date and time.

## 2022-08-30 ENCOUNTER — Encounter: Payer: Self-pay | Admitting: Oncology

## 2022-08-30 ENCOUNTER — Other Ambulatory Visit: Payer: Self-pay

## 2022-08-30 NOTE — Progress Notes (Signed)
Requested PD-L1, p16, ER/PR and MMR testing on accession [id number] with [facility] via email.

## 2022-09-01 ENCOUNTER — Inpatient Hospital Stay: Payer: Medicare PPO | Attending: Radiation Oncology | Admitting: Hematology and Oncology

## 2022-09-01 ENCOUNTER — Encounter: Payer: Self-pay | Admitting: Hematology and Oncology

## 2022-09-01 ENCOUNTER — Other Ambulatory Visit: Payer: Self-pay

## 2022-09-01 VITALS — BP 164/91 | HR 110 | Temp 99.5°F | Resp 18 | Ht 61.0 in | Wt 224.6 lb

## 2022-09-01 DIAGNOSIS — C7801 Secondary malignant neoplasm of right lung: Secondary | ICD-10-CM | POA: Diagnosis not present

## 2022-09-01 DIAGNOSIS — C7802 Secondary malignant neoplasm of left lung: Secondary | ICD-10-CM | POA: Insufficient documentation

## 2022-09-01 DIAGNOSIS — I10 Essential (primary) hypertension: Secondary | ICD-10-CM | POA: Diagnosis not present

## 2022-09-01 DIAGNOSIS — C778 Secondary and unspecified malignant neoplasm of lymph nodes of multiple regions: Secondary | ICD-10-CM | POA: Diagnosis not present

## 2022-09-01 DIAGNOSIS — E119 Type 2 diabetes mellitus without complications: Secondary | ICD-10-CM | POA: Insufficient documentation

## 2022-09-01 DIAGNOSIS — Z923 Personal history of irradiation: Secondary | ICD-10-CM | POA: Diagnosis not present

## 2022-09-01 DIAGNOSIS — Z9221 Personal history of antineoplastic chemotherapy: Secondary | ICD-10-CM | POA: Diagnosis not present

## 2022-09-01 DIAGNOSIS — C538 Malignant neoplasm of overlapping sites of cervix uteri: Secondary | ICD-10-CM | POA: Diagnosis not present

## 2022-09-01 DIAGNOSIS — C539 Malignant neoplasm of cervix uteri, unspecified: Secondary | ICD-10-CM | POA: Insufficient documentation

## 2022-09-01 DIAGNOSIS — Z170 Estrogen receptor positive status [ER+]: Secondary | ICD-10-CM | POA: Insufficient documentation

## 2022-09-01 DIAGNOSIS — Z5111 Encounter for antineoplastic chemotherapy: Secondary | ICD-10-CM | POA: Insufficient documentation

## 2022-09-01 LAB — SURGICAL PATHOLOGY

## 2022-09-01 MED ORDER — DEXAMETHASONE 4 MG PO TABS: ORAL_TABLET | ORAL | 6 refills | Status: DC

## 2022-09-01 MED ORDER — LIDOCAINE-PRILOCAINE 2.5-2.5 % EX CREA: TOPICAL_CREAM | CUTANEOUS | 3 refills | Status: DC

## 2022-09-01 MED ORDER — ONDANSETRON HCL 8 MG PO TABS: 8.0000 mg | ORAL_TABLET | Freq: Three times a day (TID) | ORAL | 1 refills | Status: DC | PRN

## 2022-09-01 MED ORDER — PROCHLORPERAZINE MALEATE 10 MG PO TABS: 10.0000 mg | ORAL_TABLET | Freq: Four times a day (QID) | ORAL | 1 refills | Status: DC | PRN

## 2022-09-01 NOTE — Progress Notes (Signed)
DISCONTINUE OFF PATHWAY REGIMEN - Other   [id number]:Cisplatin 40 mg/m2 IV D1 q7 Days + RT:   A cycle is every 7 days:     Cisplatin   **Always confirm dose/schedule in your pharmacy ordering system**  REASON: Other Reason PRIOR TREATMENT: Cisplatin 40 mg/m2 IV D1 q7 Days + RT TREATMENT RESPONSE: Complete Response (CR)  START OFF PATHWAY REGIMEN - Other   [id number]:Bevacizumab 15 mg/kg IV D1 + Carboplatin AUC=5 IV D1 + Paclitaxel 175 mg/m2 IV D1 q21 Days:   A cycle is every 21 days:     Bevacizumab-xxxx      Paclitaxel      Carboplatin   **Always confirm dose/schedule in your pharmacy ordering system**  Patient Characteristics: Intent of Therapy: Non-Curative / Palliative Intent, Discussed with Patient

## 2022-09-01 NOTE — Progress Notes (Signed)
[place] OFFICE PROGRESS NOTE  Patient Care Team: Alvester Chou, NP as PCP - General (Nurse Practitioner)  ASSESSMENT & PLAN:  Cervical cancer [facility]) I have reviewed the biopsy report and recent imaging study Unfortunately, the patient has recurrent metastatic cervical cancer Generally, in this situation, her condition is considered noncurable PD-L1 testing is still pending We reviewed the current guidelines We discussed the risk, benefits, side effects of combination treatment with carboplatin, paclitaxel and bevacizumab Various side effects were discussed and she is in agreement to proceed If PD-L1 testing come back positive, she would benefit from immunotherapy We discussed the risks of hair loss and she desire treatment with Dignicap She is aware of extra infusion time and the need to get blood work done ahead of time She will return next week to get repeat blood work and will start first dose of chemotherapy next week I plan to see her prior to cycle 2 of therapy next month Plan to repeat imaging study after 3 cycles of treatment   Diabetes mellitus without complication ([place]) She is aware about the risk of severe hyperglycemia while on treatment We discussed importance of dietary modification while on therapy  Essential hypertension Her blood pressure is high today likely due to anxiety She needs to start checking her blood pressure at home and we might need to modify her antihypertensives if her blood pressure is consistently elevated We discussed risk of worsening blood pressure control while on bevacizumab  Orders Placed This Encounter  Procedures   CBC with Differential ([place] Only)    Standing Status:   Future    Standing Expiration Date:   09/09/2023   CMP ([place] only)    Standing Status:   Future    Standing Expiration Date:   09/09/2023   Total Protein, Urine dipstick    Standing Status:   Future    Standing Expiration Date:    09/09/2023   CBC with Differential ([place] Only)    Standing Status:   Future    Standing Expiration Date:   09/30/2023   CMP ([place] only)    Standing Status:   Future    Standing Expiration Date:   09/30/2023   CMP ([place] only)    Standing Status:   Future    Standing Expiration Date:   09/02/2023   CBC with Differential ([place] Only)    Standing Status:   Future    Standing Expiration Date:   09/02/2023   Total Protein, Urine dipstick    Standing Status:   Future    Standing Expiration Date:   09/02/2023   CBC with Differential ([place] Only)    Standing Status:   Future    Standing Expiration Date:   10/21/2023   CMP ([place] only)    Standing Status:   Future    Standing Expiration Date:   10/21/2023   Total Protein, Urine dipstick    Standing Status:   Future    Standing Expiration Date:   10/21/2023   CBC with Differential ([place] Only)    Standing Status:   Future    Standing Expiration Date:   11/11/2023   CMP ([place] only)    Standing Status:   Future    Standing Expiration Date:   11/11/2023   CBC with Differential ([facility] Only)    Standing Status:   Future    Standing Expiration Date:   12/02/2023   [facility]  only)    Standing Status:   Future    Standing Expiration Date:   12/02/2023   CBC with Differential ([facility] Only)    Standing Status:   Future    Standing Expiration Date:   12/23/2023   CMP ([place] only)    Standing Status:   Future    Standing Expiration Date:   12/23/2023   Total Protein, Urine dipstick    Standing Status:   Future    Standing Expiration Date:   12/23/2023   CBC with Differential ([place] Only)    Standing Status:   Future    Standing Expiration Date:   01/13/2024   CMP ([place] only)    Standing Status:   Future    Standing Expiration Date:   01/13/2024   CBC with Differential ([place] Only)    Standing Status:   Future    Standing Expiration  Date:   02/03/2024   CMP ([place] only)    Standing Status:   Future    Standing Expiration Date:   02/03/2024    All questions were answered. The patient knows to call the clinic with any problems, questions or concerns. The total time spent in the appointment was 55 minutes encounter with patients including review of chart and various tests results, discussions about plan of care and coordination of care plan   Heath Lark, MD 09/01/2022 3:25 PM  INTERVAL HISTORY: Please see below for problem oriented charting. she returns for review test results The patient is noted to have abnormal imaging study leading to biopsy and confirmation of recurrent metastatic disease She is not symptomatic  REVIEW OF SYSTEMS:   Constitutional: Denies fevers, chills or abnormal weight loss Eyes: Denies blurriness of vision Ears, nose, mouth, throat, and face: Denies mucositis or sore throat Respiratory: Denies cough, dyspnea or wheezes Cardiovascular: Denies palpitation, chest discomfort or lower extremity swelling Gastrointestinal:  Denies nausea, heartburn or change in bowel habits Skin: Denies abnormal skin rashes Lymphatics: Denies new lymphadenopathy or easy bruising Neurological:Denies numbness, tingling or new weaknesses Behavioral/Psych: Mood is stable, no new changes  All other systems were reviewed with the patient and are negative.  I have reviewed the past medical history, past surgical history, social history and family history with the patient and they are unchanged from previous note.  ALLERGIES:  has No Known Allergies.  MEDICATIONS:  Current Outpatient Medications  Medication Sig Dispense Refill   dexamethasone (DECADRON) 4 MG tablet Take 2 tabs at the night before and 2 tab the morning of chemotherapy, every 3 weeks, by mouth x 6 cycles 36 tablet 6   calcium carbonate (TUMS - DOSED IN MG ELEMENTAL CALCIUM) 500 MG chewable tablet Chew 1 tablet by mouth daily as needed for  indigestion or heartburn.     ezetimibe (ZETIA) 10 MG tablet Take 10 mg by mouth in the morning.     glimepiride (AMARYL) 1 MG tablet Take 1 mg by mouth 2 (two) times daily.     lidocaine-prilocaine (EMLA) cream Apply 1 Application topically as needed. Use for port access 30 g 1   lidocaine-prilocaine (EMLA) cream Apply to affected area once 30 g 3   lisinopril (ZESTRIL) 5 MG tablet Take 5 mg by mouth in the morning.     magnesium oxide (MAG-OX) 400 (241.3 Mg) MG tablet Take 1 tablet (400 mg total) by mouth 2 (two) times daily. 60 tablet 11   ondansetron (ZOFRAN) 8 MG tablet Take 1 tablet (8  mg total) by mouth every 8 (eight) hours as needed for nausea or vomiting. Start on the third day after chemotherapy. 30 tablet 1   prochlorperazine (COMPAZINE) 10 MG tablet Take 1 tablet (10 mg total) by mouth every 6 (six) hours as needed for nausea or vomiting. 30 tablet 1   rosuvastatin (CRESTOR) 40 MG tablet Take 40 mg by mouth at bedtime.     sitaGLIPtin (JANUVIA) 100 MG tablet Take 50 mg by mouth in the morning.     vitamin B-12 (CYANOCOBALAMIN) 1000 MCG tablet Take 1,000 mcg by mouth in the morning.     No current facility-administered medications for this visit.    SUMMARY OF ONCOLOGIC HISTORY: Oncology History Overview Note  Adenocarcinoma, ER/PR positive   Cervical cancer ([place])  08/13/2020 Pathology Results   Cervical biopsy showed poorly differentiated carcinoma. 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   08/27/2020 Imaging   MRI pelvis Large cervical mass with pelvic adenopathy, at least T2B N1 disease. Question of involvement of posterior urinary bladder (T4) for which additional images have been requested. Patient will return for additional images to answer this question. (Thinner section axial T2 without fat sat and sagittal T2 weighted imaging also without fat saturation.)   Small field-of-view images show that the parametrial extension comes in very  close proximity to the RIGHT ureter.   08/30/2020 Initial Diagnosis   Cervical cancer ([place])   08/30/2020 Cancer Staging   Staging form: Cervix Uteri, AJCC Version 9 - Clinical stage from 08/30/2020: FIGO Stage IVB (rcT2, cN1, pM1) - Signed by Heath Lark, MD on 09/01/2022 Stage prefix: Recurrence   09/01/2020 PET scan   1. Large cervical mass extending into the endometrial canal to the level of the uterine fundus, associated with pelvic adenopathy to the level of the RIGHT common iliac chain. Please refer to MRI for further detail. 2. No signs of solid organ uptake or metastatic disease to the chest. 3. Subcutaneous nodule with marked increased metabolic activity, correlation with direct clinical inspection is suggested to exclude cutaneous neoplasm or subcutaneous nodule in this location. Complicated sebaceous cyst could also potentially have this appearance. 4. Uptake in the anal canal could likely physiologic. Given the intense nature of the uptake would suggest correlation with direct clinical inspection/examination. 5. Uptake in axillary lymph nodes with activity less than mediastinal blood pool likely small reactive lymph nodes, attention on follow-up. Morphologic features also with benign appearance.   09/16/2020 Procedure   Successful placement of a power injectable Port-A-Cath via the right internal jugular vein. The catheter is ready for immediate use.     09/20/2020 - 10/25/2020 Chemotherapy   She received cisplatin chemo with concurrent radiation       09/22/2020 - 12/08/2020 Radiation Therapy   Radiation Treatment Dates: 09/22/2020 through 12/08/2020   Site: Pelvis Technique: IMRT Total Dose (Gy): 45/45 Dose per Fx (Gy): 1.8 Completed Fx: 25/25 Beam Energies: 6x   Site: pelvic boost Technique: IMRT Total Dose (Gy): 7.2/7.2 Dose per Fx (Gy): 1.8 Completed Fx: 4/4 Beam Energies: 6x   Site: Cervix boost Technique: HDR-brachytherapy Total Dose (Gy): 27.5/27.5 Dose per  Fx (Gy): 5.5 Completed Fx: 5/5 Beam Energies: Ir-192     03/17/2021 PET scan   1. Mark positive response to therapy. 2. Resolution of metabolic activity within the uterine body. 3. Resolution of size and metabolic activity of pelvic lymph nodes. 4. No evidence of new disease outside of the pelvis.   08/10/2022 PET scan  1. New hypermetabolic bilateral pulmonary nodules and hypermetabolic cervical and hilar lymph nodes, consistent with metastatic disease. 2. Hypermetabolic focus in the anterior right third costochondral junction without discrete CT correlate is also compatible with metastatic disease. 3. Prior hysterectomy without suspicious hypermetabolic nodularity along the vaginal cuff. 4.  Aortic Atherosclerosis (ICD10-I70.0).   08/24/2022 Pathology Results   A. SOFT TISSUE NODULE, RIGHT CHEST WALL, NEEDLE CORE BIOPSY:  Poorly differentiated carcinoma.  See comment.   COMMENT:  The carcinoma is characterized by nest of malignant epithelioid cells which focally has features suggestive of squamous differentiation morphologically.  Immunohistochemistry is positive with cytokeratin 7, PAX8, MOC-31 and estrogen receptor.  There is patchy positivity with p16, progesterone receptor and TTF-1.  The tumor is negative with p40, cytokeratin 5/6, p63, CDX2, cytokeratin 20 and WT-1.  The immunophenotype is consistent with adenocarcinoma and suggestive of a gynecologic primary.    09/08/2022 -  Chemotherapy   Patient is on Treatment Plan : OVARIAN Carboplatin + Paclitaxel + Bevacizumab q21d      Malignant neoplasm of cervix ([place])  09/01/2020 Initial Diagnosis   Malignant neoplasm of cervix ([place])   09/20/2020 - 10/25/2020 Chemotherapy   She received cisplatin chemo with concurrent radiation       09/22/2020 - 12/08/2020 Radiation Therapy   Radiation Treatment Dates: 09/22/2020 through 12/08/2020   Site: Pelvis Technique: IMRT Total Dose (Gy): 45/45 Dose per Fx (Gy): 1.8 Completed Fx:  25/25 Beam Energies: 6x   Site: pelvic boost Technique: IMRT Total Dose (Gy): 7.2/7.2 Dose per Fx (Gy): 1.8 Completed Fx: 4/4 Beam Energies: 6x   Site: Cervix boost Technique: HDR-brachytherapy Total Dose (Gy): 27.5/27.5 Dose per Fx (Gy): 5.5 Completed Fx: 5/5 Beam Energies: Ir-192   09/08/2022 -  Chemotherapy   Patient is on Treatment Plan : OVARIAN Carboplatin + Paclitaxel + Bevacizumab q21d        PHYSICAL EXAMINATION: ECOG PERFORMANCE STATUS: 0 - Asymptomatic  Vitals:   09/01/22 1303  BP: (!) 164/91  Pulse: (!) 110  Resp: 18  Temp: 99.5 F (37.5 C)  SpO2: 97%   Filed Weights   09/01/22 1303  Weight: 224 lb 9.6 oz (101.9 kg)    GENERAL:alert, no distress and comfortable NEURO: alert & oriented x 3 with fluent speech, no focal motor/sensory deficits  LABORATORY DATA:  I have reviewed the data as listed    Component Value Date/Time   NA 135 07/06/2022 1003   K 4.1 07/06/2022 1003   CL 97 (L) 07/06/2022 1003   CO2 31 07/06/2022 1003   GLUCOSE 303 (H) 07/06/2022 1003   BUN 17 07/06/2022 1003   CREATININE 1.19 (H) 07/06/2022 1003   CALCIUM 9.8 07/06/2022 1003   PROT 6.8 05/27/2021 1130   ALBUMIN 3.9 05/27/2021 1130   AST 12 (L) 05/27/2021 1130   ALT 9 05/27/2021 1130   ALKPHOS 44 05/27/2021 1130   BILITOT 1.2 05/27/2021 1130   GFRNONAA 49 (L) 07/06/2022 1003    No results found for: "SPEP", "UPEP"  Lab Results  Component Value Date   WBC 5.4 08/24/2022   NEUTROABS 3.5 07/06/2022   HGB 12.8 08/24/2022   HCT 37.0 08/24/2022   MCV 91.4 08/24/2022   PLT 215 08/24/2022      Chemistry      Component Value Date/Time   NA 135 07/06/2022 1003   K 4.1 07/06/2022 1003   CL 97 (L) 07/06/2022 1003   CO2 31 07/06/2022 1003   BUN 17 07/06/2022 1003   CREATININE 1.19 (  H) 07/06/2022 1003      Component Value Date/Time   CALCIUM 9.8 07/06/2022 1003   ALKPHOS 44 05/27/2021 1130   AST 12 (L) 05/27/2021 1130   ALT 9 05/27/2021 1130   BILITOT 1.2  05/27/2021 1130       RADIOGRAPHIC STUDIES: I have personally reviewed the radiological images as listed and agreed with the findings in the report. CT BIOPSY  Result Date: 08/24/2022 INDICATION: 71 year old woman with history of cervical malignancy was found to have new FDG avid pulmonary nodules and right chest wall lesion. She presents to IR today for biopsy of FDG avid right chest wall lesion. EXAM: CT-guided biopsy of right chest wall lesion TECHNIQUE: Multidetector CT imaging of the chest was performed following the standard protocol without IV contrast. RADIATION DOSE REDUCTION: This exam was performed according to the departmental dose-optimization program which includes automated exposure control, adjustment of the mA and/or kV according to patient size and/or use of iterative reconstruction technique. MEDICATIONS: None. ANESTHESIA/SEDATION: Moderate (conscious) sedation was employed during this procedure. A total of Versed 2 mg and Fentanyl 100 mcg was administered intravenously by the radiology nurse. Total intra-service moderate Sedation Time: 10 minutes. The patient's level of consciousness and vital signs were monitored continuously by radiology nursing throughout the procedure under my direct supervision. COMPLICATIONS: None immediate. PROCEDURE: Informed written consent was obtained from the patient after a thorough discussion of the procedural risks, benefits and alternatives. All questions were addressed. Maximal Sterile Barrier Technique was utilized including caps, mask, sterile gowns, sterile gloves, sterile drape, hand hygiene and skin antiseptic. A timeout was performed prior to the initiation of the procedure. Patient position supine on the CT table. Right chest wall skin prepped and draped in usual sterile fashion. Following local lidocaine administration, 17 gauge introducer needle was advanced into the FDG avid right chest wall lesion just inferior to the right third rib  cartilage utilizing CT guidance. Four cores were obtained from the right chest wall lesion and sent to pathology in formalin. Needle removed and hemostasis achieved with manual compression. IMPRESSION: CT-guided biopsy of a right chest wall lesion. Electronically Signed   By: Miachel Roux M.D.   On: 08/24/2022 13:24   DG Chest Port 1 View  Result Date: 08/24/2022 CLINICAL DATA:  Status post right chest wall lesion biopsy EXAM: PORTABLE CHEST - 1 VIEW COMPARISON:  08/24/2022 FINDINGS: Cardiomediastinal silhouette and pulmonary vasculature are within normal limits. No pneumothorax. Known pulmonary nodules better seen on prior PET-CT. Right chest port unchanged in position. IMPRESSION: No pneumothorax status post right chest wall lesion biopsy. Electronically Signed   By: Miachel Roux M.D.   On: 08/24/2022 13:21   DG Chest Port 1 View  Result Date: 08/24/2022 CLINICAL DATA:  Status post right chest wall mass biopsy EXAM: PORTABLE CHEST - 1 VIEW COMPARISON:  05/27/2021 FINDINGS: Cardiomediastinal silhouette and pulmonary vasculature are within normal limits. Known lung lesions are not well visualized on the current examination. Right chest port is unchanged in position. Linear edge along the lateral right lung base favored to be skin fold rather than pneumothorax. IMPRESSION: Linear edge along the lateral right lung base favored to be skin fold rather than pneumothorax. Electronically Signed   By: Miachel Roux M.D.   On: 08/24/2022 13:20   NM PET Image Restage (PS) Skull Base to Thigh (F-18 FDG)  Result Date: 08/10/2022 CLINICAL DATA:  Subsequent treatment strategy for cervical cancer. EXAM: NUCLEAR MEDICINE PET SKULL BASE TO THIGH TECHNIQUE: 10.82 mCi F-18  FDG was injected intravenously. Full-ring PET imaging was performed from the skull base to thigh after the radiotracer. CT data was obtained and used for attenuation correction and anatomic localization. Fasting blood glucose: 143 mg/dl COMPARISON:   Multiple priors including most recent PET-CT March 17, 2021 FINDINGS: Mediastinal blood pool activity: SUV max 2.7 Liver activity: SUV max NA NECK: No hypermetabolic cervical adenopathy. Incidental CT findings: None. CHEST: New bilateral hypermetabolic pulmonary nodules. For reference: -hypermetabolic left upper lobe pulmonary nodule measures 8 mm on image 17/7 with a max SUV of 5.9 -hypermetabolic right upper lobe pulmonary nodule measures 8 mm on image 15/7 with a max SUV of 4.5. Hypermetabolic cervical and hilar lymph nodes.  For reference: -hypermetabolic left hilar lymph node is not well seen on noncontrast enhanced CT but has a max SUV of 3.7. -hypermetabolic precarinal lymph node measures 12 mm in short axis on image 67/4 with a max SUV of 16.6. Incidental CT findings: Right chest Port-A-Cath with tip at the superior cavoatrial junction. Calcified left hilar lymph nodes. Aortic atherosclerosis. Small hiatal hernia. ABDOMEN/PELVIS: Prior hysterectomy without suspicious hypermetabolic nodularity along the vaginal cuff. No abnormal hypermetabolic activity within the liver, pancreas, adrenal glands, or spleen. No hypermetabolic lymph nodes in the abdomen or pelvis. Incidental CT findings: Calcified splenic granulomata. Nonobstructive right renal stones measure up to 8 mm. Aortic atherosclerosis. Colonic diverticulosis without findings of acute diverticulitis. SKELETON: Hypermetabolic focus in the anterior right third costochondral junction without discrete CT correlate with a max SUV of 9.9 Incidental CT findings: Multilevel degenerative changes spine with mild multifocal degenerative joint disease. IMPRESSION: 1. New hypermetabolic bilateral pulmonary nodules and hypermetabolic cervical and hilar lymph nodes, consistent with metastatic disease. 2. Hypermetabolic focus in the anterior right third costochondral junction without discrete CT correlate is also compatible with metastatic disease. 3. Prior hysterectomy  without suspicious hypermetabolic nodularity along the vaginal cuff. 4.  Aortic Atherosclerosis (ICD10-I70.0). These results will be called to the ordering clinician or representative by the Radiologist Assistant, and communication documented in the PACS or [employer]. Electronically Signed   By: Dahlia Bailiff M.D.   On: 08/10/2022 17:06

## 2022-09-01 NOTE — Assessment & Plan Note (Signed)
I have reviewed the biopsy report and recent imaging study Unfortunately, the patient has recurrent metastatic cervical cancer Generally, in this situation, her condition is considered noncurable PD-L1 testing is still pending We reviewed the current guidelines We discussed the risk, benefits, side effects of combination treatment with carboplatin, paclitaxel and bevacizumab Various side effects were discussed and she is in agreement to proceed If PD-L1 testing come back positive, she would benefit from immunotherapy We discussed the risks of hair loss and she desire treatment with Dignicap She is aware of extra infusion time and the need to get blood work done ahead of time She will return next week to get repeat blood work and will start first dose of chemotherapy next week I plan to see her prior to cycle 2 of therapy next month Plan to repeat imaging study after 3 cycles of treatment

## 2022-09-01 NOTE — Assessment & Plan Note (Signed)
She is aware about the risk of severe hyperglycemia while on treatment We discussed importance of dietary modification while on therapy

## 2022-09-01 NOTE — Assessment & Plan Note (Signed)
Her blood pressure is high today likely due to anxiety She needs to start checking her blood pressure at home and we might need to modify her antihypertensives if her blood pressure is consistently elevated We discussed risk of worsening blood pressure control while on bevacizumab

## 2022-09-03 ENCOUNTER — Other Ambulatory Visit: Payer: Self-pay

## 2022-09-06 ENCOUNTER — Telehealth: Payer: Self-pay | Admitting: Oncology

## 2022-09-06 ENCOUNTER — Inpatient Hospital Stay: Payer: Medicare PPO

## 2022-09-06 DIAGNOSIS — C539 Malignant neoplasm of cervix uteri, unspecified: Secondary | ICD-10-CM

## 2022-09-06 DIAGNOSIS — Z5111 Encounter for antineoplastic chemotherapy: Secondary | ICD-10-CM | POA: Diagnosis not present

## 2022-09-06 LAB — CMP (CANCER CENTER ONLY)
ALT: 11 U/L (ref 0–44)
AST: 15 U/L (ref 15–41)
Albumin: 4 g/dL (ref 3.5–5.0)
Alkaline Phosphatase: 45 U/L (ref 38–126)
Anion gap: 5 (ref 5–15)
BUN: 13 mg/dL (ref 8–23)
CO2: 30 mmol/L (ref 22–32)
Calcium: 9.4 mg/dL (ref 8.9–10.3)
Chloride: 102 mmol/L (ref 98–111)
Creatinine: 1.01 mg/dL — ABNORMAL HIGH (ref 0.44–1.00)
GFR, Estimated: 60 mL/min — ABNORMAL LOW (ref >60)
Glucose, Bld: 112 mg/dL — ABNORMAL HIGH (ref 70–99)
Potassium: 4.1 mmol/L (ref 3.5–5.1)
Sodium: 137 mmol/L (ref 135–145)
Total Bilirubin: 0.6 mg/dL (ref 0.3–1.2)
Total Protein: 7.2 g/dL (ref 6.5–8.1)

## 2022-09-06 LAB — CBC WITH DIFFERENTIAL (CANCER CENTER ONLY)
Abs Immature Granulocytes: 0 10*3/uL (ref 0.00–0.07)
Basophils Absolute: 0 10*3/uL (ref 0.0–0.1)
Basophils Relative: 0 %
Eosinophils Absolute: 0.1 10*3/uL (ref 0.0–0.5)
Eosinophils Relative: 3 %
HCT: 35.6 % — ABNORMAL LOW (ref 36.0–46.0)
Hemoglobin: 12.4 g/dL (ref 12.0–15.0)
Immature Granulocytes: 0 %
Lymphocytes Relative: 15 %
Lymphs Abs: 0.8 10*3/uL (ref 0.7–4.0)
MCH: 31.5 pg (ref 26.0–34.0)
MCHC: 34.8 g/dL (ref 30.0–36.0)
MCV: 90.4 fL (ref 80.0–100.0)
Monocytes Absolute: 0.3 10*3/uL (ref 0.1–1.0)
Monocytes Relative: 6 %
Neutro Abs: 3.8 10*3/uL (ref 1.7–7.7)
Neutrophils Relative %: 76 %
Platelet Count: 199 10*3/uL (ref 150–400)
RBC: 3.94 MIL/uL (ref 3.87–5.11)
RDW: 13.4 % (ref 11.5–15.5)
WBC Count: 5.1 10*3/uL (ref 4.0–10.5)
nRBC: 0 % (ref 0.0–0.2)

## 2022-09-06 LAB — TOTAL PROTEIN, URINE DIPSTICK: Protein, ur: <30 mg/dL — AB

## 2022-09-06 MED ORDER — SODIUM CHLORIDE 0.9% FLUSH
10.0000 mL | Freq: Once | INTRAVENOUS | Status: AC
  Administered 2022-09-06: 10 mL

## 2022-09-06 MED ORDER — HEPARIN SOD (PORK) LOCK FLUSH 100 UNIT/ML IV SOLN
500.0000 [IU] | Freq: Once | INTRAVENOUS | Status: AC
  Administered 2022-09-06: 500 [IU]

## 2022-09-06 NOTE — Telephone Encounter (Signed)
Called [place] and discussed the [place].  She wants to try it with her first infusion and has not been able to order it her kit/cards yet due to [place] being closed for the holidays.  Advised her she will need to purchase at least one kit and card to replace the supply in the infusion room and have the receipt available at her infusion.  [place] verbalized agreement and is going to try ordering her supplies today.  She will call if she changes her mind or has trouble ordering.

## 2022-09-07 ENCOUNTER — Other Ambulatory Visit: Payer: Self-pay

## 2022-09-07 ENCOUNTER — Telehealth: Payer: Self-pay | Admitting: Oncology

## 2022-09-07 MED FILL — Dexamethasone Sodium Phosphate Inj 100 MG/10ML: INTRAMUSCULAR | Qty: 1 | Status: AC

## 2022-09-07 MED FILL — Fosaprepitant Dimeglumine For IV Infusion 150 MG (Base Eq): INTRAVENOUS | Qty: 5 | Status: AC

## 2022-09-07 NOTE — Telephone Encounter (Signed)
Misty Deleon called and said she has decided not to use the Dignicap.  Advised I will let the infusion room know and that her arrival time will still be the same for tomorrow.  She verbalized understanding and agreement.

## 2022-09-07 NOTE — Telephone Encounter (Signed)
Called Misty Deleon regarding the [place] and she said she is working on ordering it today.  Provided her with my contact information in case she has questions.

## 2022-09-08 ENCOUNTER — Inpatient Hospital Stay: Payer: Medicare PPO

## 2022-09-08 VITALS — BP 138/74 | HR 81 | Temp 97.8°F | Resp 18 | Ht 62.0 in | Wt 218.0 lb

## 2022-09-08 DIAGNOSIS — C539 Malignant neoplasm of cervix uteri, unspecified: Secondary | ICD-10-CM

## 2022-09-08 DIAGNOSIS — Z5111 Encounter for antineoplastic chemotherapy: Secondary | ICD-10-CM | POA: Diagnosis not present

## 2022-09-08 MED ORDER — SODIUM CHLORIDE 0.9 % IV SOLN
150.0000 mg | Freq: Once | INTRAVENOUS | Status: AC
  Administered 2022-09-08: 150 mg via INTRAVENOUS
  Filled 2022-09-08: qty 150

## 2022-09-08 MED ORDER — SODIUM CHLORIDE 0.9% FLUSH: 10.0000 mL | INTRAVENOUS | Status: DC | PRN

## 2022-09-08 MED ORDER — SODIUM CHLORIDE 0.9 % IV SOLN
175.0000 mg/m2 | Freq: Once | INTRAVENOUS | Status: AC
  Administered 2022-09-08: 366 mg via INTRAVENOUS
  Filled 2022-09-08: qty 61

## 2022-09-08 MED ORDER — PALONOSETRON HCL INJECTION 0.25 MG/5ML
0.2500 mg | Freq: Once | INTRAVENOUS | Status: AC
  Administered 2022-09-08: 0.25 mg via INTRAVENOUS
  Filled 2022-09-08: qty 5

## 2022-09-08 MED ORDER — CETIRIZINE HCL 10 MG/ML IV SOLN
10.0000 mg | Freq: Once | INTRAVENOUS | Status: AC
  Administered 2022-09-08: 10 mg via INTRAVENOUS
  Filled 2022-09-08: qty 1

## 2022-09-08 MED ORDER — FAMOTIDINE IN NACL 20-0.9 MG/50ML-% IV SOLN
20.0000 mg | Freq: Once | INTRAVENOUS | Status: AC
  Administered 2022-09-08: 20 mg via INTRAVENOUS
  Filled 2022-09-08: qty 50

## 2022-09-08 MED ORDER — SODIUM CHLORIDE 0.9 % IV SOLN
10.0000 mg | Freq: Once | INTRAVENOUS | Status: AC
  Administered 2022-09-08: 10 mg via INTRAVENOUS
  Filled 2022-09-08: qty 10

## 2022-09-08 MED ORDER — HEPARIN SOD (PORK) LOCK FLUSH 100 UNIT/ML IV SOLN: 500.0000 [IU] | Freq: Once | INTRAVENOUS | Status: DC | PRN

## 2022-09-08 MED ORDER — SODIUM CHLORIDE 0.9 % IV SOLN: Freq: Once | INTRAVENOUS | Status: AC

## 2022-09-08 MED ORDER — SODIUM CHLORIDE 0.9 % IV SOLN
508.0000 mg | Freq: Once | INTRAVENOUS | Status: AC
  Administered 2022-09-08: 510 mg via INTRAVENOUS
  Filled 2022-09-08: qty 51

## 2022-09-08 MED ORDER — SODIUM CHLORIDE 0.9 % IV SOLN
15.0000 mg/kg | Freq: Once | INTRAVENOUS | Status: AC
  Administered 2022-09-08: 1500 mg via INTRAVENOUS
  Filled 2022-09-08: qty 48

## 2022-09-08 NOTE — Patient Instructions (Signed)
[facility] ONCOLOGY  Discharge Instructions: Thank you for choosing [facility] to provide your oncology and hematology care.   If you have a lab appointment with the [facility], please go directly to the [facility] and check in at the registration area.   Wear comfortable clothing and clothing appropriate for easy access to any Portacath or PICC line.   We strive to give you quality time with your provider. You may need to reschedule your appointment if you arrive late (15 or more minutes).  Arriving late affects you and other patients whose appointments are after yours.  Also, if you miss three or more appointments without notifying the office, you may be dismissed from the clinic at the provider's discretion.      For prescription refill requests, have your pharmacy contact our office and allow 72 hours for refills to be completed.    Today you received the following chemotherapy and/or immunotherapy agents: Bevacizumab, Paclitaxel, Carboplatin      To help prevent nausea and vomiting after your treatment, we encourage you to take your nausea medication as directed.  BELOW ARE SYMPTOMS THAT SHOULD BE REPORTED IMMEDIATELY: *FEVER GREATER THAN 100.4 F (38 C) OR HIGHER *CHILLS OR SWEATING *NAUSEA AND VOMITING THAT IS NOT CONTROLLED WITH YOUR NAUSEA MEDICATION *UNUSUAL SHORTNESS OF BREATH *UNUSUAL BRUISING OR BLEEDING *URINARY PROBLEMS (pain or burning when urinating, or frequent urination) *BOWEL PROBLEMS (unusual diarrhea, constipation, pain near the anus) TENDERNESS IN MOUTH AND THROAT WITH OR WITHOUT PRESENCE OF ULCERS (sore throat, sores in mouth, or a toothache) UNUSUAL RASH, SWELLING OR PAIN  UNUSUAL VAGINAL DISCHARGE OR ITCHING   Items with * indicate a potential emergency and should be followed up as soon as possible or go to the Emergency Department if any problems should occur.  Please show the CHEMOTHERAPY ALERT CARD or IMMUNOTHERAPY  ALERT CARD at check-in to the Emergency Department and triage nurse.  Should you have questions after your visit or need to cancel or reschedule your appointment, please contact [facility]  Dept: [phone]  and follow the prompts.  Office hours are 8:00 a.m. to 4:30 p.m. Monday - Friday. Please note that voicemails left after 4:00 p.m. may not be returned until the following business day.  We are closed weekends and major holidays. You have access to a nurse at all times for urgent questions. Please call the main number to the clinic Dept: [phone] and follow the prompts.   For any non-urgent questions, you may also contact your provider using MyChart. We now offer e-Visits for anyone 50 and older to request care online for non-urgent symptoms. For details visit [email or web address].   Also download the MyChart app! Go to the app store, search "MyChart", open the app, select [facility], and log in with your MyChart username and password.

## 2022-09-12 ENCOUNTER — Encounter: Payer: Self-pay | Admitting: Hematology and Oncology

## 2022-09-12 NOTE — Telephone Encounter (Signed)
-----   Message from Juanetta Gosling, RN sent at 09/08/2022  2:49 PM EST ----- Regarding: First time mvasi/taxol/carbo [place] time mvasi, taxol, [place]- Dr. Alvy Bimler patient. Handled tx without problems. Needs first time call back.

## 2022-09-12 NOTE — Telephone Encounter (Signed)
Called pt to see how she did with her appt.  She reports doing well over w/e but now has body aches particularly in her legs.  It is bilateral & she took tylenol once but it didn't help.  Encouraged to continue tylenol for a while & try some warm baths/warmth & see if that helps.  If from Paclitaxel, should go away in a few days, but encouraged to call if not tolerated.  She expressed understanding.

## 2022-09-13 ENCOUNTER — Other Ambulatory Visit: Payer: Self-pay | Admitting: Hematology and Oncology

## 2022-09-14 ENCOUNTER — Telehealth: Payer: Self-pay | Admitting: Oncology

## 2022-09-14 NOTE — Telephone Encounter (Signed)
Misty Deleon called and asked if we could fax a prescription for a wig to [facility] at [phone].  Advised I will fax it today.  Also advised we have rescheduled her next infusion on 09/28/22 to be on the same day as her labs and apt with Dr. Alvy Bimler since she does not need extra time for the [facility].  She verbalized understanding and agreement.

## 2022-09-19 ENCOUNTER — Telehealth: Payer: Self-pay | Admitting: Oncology

## 2022-09-19 ENCOUNTER — Other Ambulatory Visit: Payer: Self-pay | Admitting: Hematology and Oncology

## 2022-09-19 DIAGNOSIS — B379 Candidiasis, unspecified: Secondary | ICD-10-CM

## 2022-09-19 MED ORDER — FLUCONAZOLE 100 MG PO TABS: 100.0000 mg | ORAL_TABLET | Freq: Every day | ORAL | 0 refills | Status: DC

## 2022-09-19 NOTE — Telephone Encounter (Signed)
Called Misty Deleon and advised her that a prescription has been sent for fluconazole to [facility] for her to try.  She verbalized understanding and agreement.

## 2022-09-19 NOTE — Telephone Encounter (Signed)
Misty Deleon called and said she thinks she has a yeast infection and is not sure what to do.  She said it burns on the outside when she urinates and she is having a little bit of itching.  She denies having more urinary frequency, fever or vaginal discharge.  It started after she had some diarrhea after taking a laxative.  The diarrhea has stopped but she thinks that it what caused the yeast infection.  Advised her that I will check with Dr. Alvy Bimler for recommendations and call her back.

## 2022-09-19 NOTE — Telephone Encounter (Signed)
I will send a course of fluconazole to her pharmacy to try

## 2022-09-28 ENCOUNTER — Inpatient Hospital Stay: Payer: Medicare PPO

## 2022-09-28 ENCOUNTER — Encounter: Payer: Self-pay | Admitting: Hematology and Oncology

## 2022-09-28 ENCOUNTER — Inpatient Hospital Stay: Payer: Medicare PPO | Attending: Radiation Oncology | Admitting: Hematology and Oncology

## 2022-09-28 VITALS — HR 115

## 2022-09-28 VITALS — BP 147/91 | HR 138 | Temp 98.2°F | Resp 18 | Ht 62.0 in | Wt 214.4 lb

## 2022-09-28 DIAGNOSIS — E1122 Type 2 diabetes mellitus with diabetic chronic kidney disease: Secondary | ICD-10-CM | POA: Insufficient documentation

## 2022-09-28 DIAGNOSIS — Z5112 Encounter for antineoplastic immunotherapy: Secondary | ICD-10-CM | POA: Diagnosis present

## 2022-09-28 DIAGNOSIS — Z5111 Encounter for antineoplastic chemotherapy: Secondary | ICD-10-CM | POA: Insufficient documentation

## 2022-09-28 DIAGNOSIS — Z7984 Long term (current) use of oral hypoglycemic drugs: Secondary | ICD-10-CM | POA: Diagnosis not present

## 2022-09-28 DIAGNOSIS — I129 Hypertensive chronic kidney disease with stage 1 through stage 4 chronic kidney disease, or unspecified chronic kidney disease: Secondary | ICD-10-CM | POA: Insufficient documentation

## 2022-09-28 DIAGNOSIS — E119 Type 2 diabetes mellitus without complications: Secondary | ICD-10-CM

## 2022-09-28 DIAGNOSIS — N183 Chronic kidney disease, stage 3 unspecified: Secondary | ICD-10-CM | POA: Insufficient documentation

## 2022-09-28 DIAGNOSIS — Z923 Personal history of irradiation: Secondary | ICD-10-CM | POA: Insufficient documentation

## 2022-09-28 DIAGNOSIS — C538 Malignant neoplasm of overlapping sites of cervix uteri: Secondary | ICD-10-CM

## 2022-09-28 DIAGNOSIS — M2550 Pain in unspecified joint: Secondary | ICD-10-CM | POA: Diagnosis not present

## 2022-09-28 DIAGNOSIS — C539 Malignant neoplasm of cervix uteri, unspecified: Secondary | ICD-10-CM | POA: Diagnosis present

## 2022-09-28 DIAGNOSIS — Z79899 Other long term (current) drug therapy: Secondary | ICD-10-CM | POA: Insufficient documentation

## 2022-09-28 LAB — CMP (CANCER CENTER ONLY)
ALT: 17 U/L (ref 0–44)
AST: 17 U/L (ref 15–41)
Albumin: 3.9 g/dL (ref 3.5–5.0)
Alkaline Phosphatase: 57 U/L (ref 38–126)
Anion gap: 11 (ref 5–15)
BUN: 17 mg/dL (ref 8–23)
CO2: 26 mmol/L (ref 22–32)
Calcium: 9.9 mg/dL (ref 8.9–10.3)
Chloride: 98 mmol/L (ref 98–111)
Creatinine: 1.15 mg/dL — ABNORMAL HIGH (ref 0.44–1.00)
GFR, Estimated: 51 mL/min — ABNORMAL LOW (ref >60)
Glucose, Bld: 322 mg/dL — ABNORMAL HIGH (ref 70–99)
Potassium: 4.4 mmol/L (ref 3.5–5.1)
Sodium: 135 mmol/L (ref 135–145)
Total Bilirubin: 0.7 mg/dL (ref 0.3–1.2)
Total Protein: 7 g/dL (ref 6.5–8.1)

## 2022-09-28 LAB — CBC WITH DIFFERENTIAL (CANCER CENTER ONLY)
Abs Immature Granulocytes: 0.04 10*3/uL (ref 0.00–0.07)
Basophils Absolute: 0 10*3/uL (ref 0.0–0.1)
Basophils Relative: 0 %
Eosinophils Absolute: 0 10*3/uL (ref 0.0–0.5)
Eosinophils Relative: 0 %
HCT: 36.6 % (ref 36.0–46.0)
Hemoglobin: 13.5 g/dL (ref 12.0–15.0)
Immature Granulocytes: 1 %
Lymphocytes Relative: 9 %
Lymphs Abs: 0.5 10*3/uL — ABNORMAL LOW (ref 0.7–4.0)
MCH: 32.6 pg (ref 26.0–34.0)
MCHC: 36.9 g/dL — ABNORMAL HIGH (ref 30.0–36.0)
MCV: 88.4 fL (ref 80.0–100.0)
Monocytes Absolute: 0 10*3/uL — ABNORMAL LOW (ref 0.1–1.0)
Monocytes Relative: 1 %
Neutro Abs: 4.8 10*3/uL (ref 1.7–7.7)
Neutrophils Relative %: 89 %
Platelet Count: 195 10*3/uL (ref 150–400)
RBC: 4.14 MIL/uL (ref 3.87–5.11)
RDW: 14 % (ref 11.5–15.5)
WBC Count: 5.3 10*3/uL (ref 4.0–10.5)
nRBC: 0 % (ref 0.0–0.2)

## 2022-09-28 MED ORDER — SODIUM CHLORIDE 0.9 % IV SOLN
10.0000 mg | Freq: Once | INTRAVENOUS | Status: AC
  Administered 2022-09-28: 10 mg via INTRAVENOUS
  Filled 2022-09-28: qty 10

## 2022-09-28 MED ORDER — SODIUM CHLORIDE 0.9% FLUSH
10.0000 mL | INTRAVENOUS | Status: DC | PRN
  Administered 2022-09-28: 10 mL

## 2022-09-28 MED ORDER — PALONOSETRON HCL INJECTION 0.25 MG/5ML
0.2500 mg | Freq: Once | INTRAVENOUS | Status: AC
  Administered 2022-09-28: 0.25 mg via INTRAVENOUS
  Filled 2022-09-28: qty 5

## 2022-09-28 MED ORDER — SODIUM CHLORIDE 0.9 % IV SOLN
15.0000 mg/kg | Freq: Once | INTRAVENOUS | Status: AC
  Administered 2022-09-28: 1500 mg via INTRAVENOUS
  Filled 2022-09-28: qty 48

## 2022-09-28 MED ORDER — SODIUM CHLORIDE 0.9 % IV SOLN
175.0000 mg/m2 | Freq: Once | INTRAVENOUS | Status: AC
  Administered 2022-09-28: 360 mg via INTRAVENOUS
  Filled 2022-09-28: qty 60

## 2022-09-28 MED ORDER — HEPARIN SOD (PORK) LOCK FLUSH 100 UNIT/ML IV SOLN
500.0000 [IU] | Freq: Once | INTRAVENOUS | Status: AC | PRN
  Administered 2022-09-28: 500 [IU]

## 2022-09-28 MED ORDER — SODIUM CHLORIDE 0.9% FLUSH
10.0000 mL | Freq: Once | INTRAVENOUS | Status: AC
  Administered 2022-09-28: 10 mL

## 2022-09-28 MED ORDER — SODIUM CHLORIDE 0.9 % IV SOLN: Freq: Once | INTRAVENOUS | Status: AC

## 2022-09-28 MED ORDER — INSULIN ASPART 100 UNIT/ML IJ SOLN
10.0000 [IU] | Freq: Once | INTRAMUSCULAR | Status: AC
  Administered 2022-09-28: 10 [IU] via SUBCUTANEOUS
  Filled 2022-09-28: qty 1

## 2022-09-28 MED ORDER — SODIUM CHLORIDE 0.9 % IV SOLN
150.0000 mg | Freq: Once | INTRAVENOUS | Status: AC
  Administered 2022-09-28: 150 mg via INTRAVENOUS
  Filled 2022-09-28: qty 150

## 2022-09-28 MED ORDER — TRAMADOL HCL 50 MG PO TABS: 50.0000 mg | ORAL_TABLET | Freq: Four times a day (QID) | ORAL | 0 refills | Status: DC | PRN

## 2022-09-28 MED ORDER — FAMOTIDINE IN NACL 20-0.9 MG/50ML-% IV SOLN
20.0000 mg | Freq: Once | INTRAVENOUS | Status: AC
  Administered 2022-09-28: 20 mg via INTRAVENOUS
  Filled 2022-09-28: qty 50

## 2022-09-28 MED ORDER — CETIRIZINE HCL 10 MG/ML IV SOLN
10.0000 mg | Freq: Once | INTRAVENOUS | Status: AC
  Administered 2022-09-28: 10 mg via INTRAVENOUS
  Filled 2022-09-28: qty 1

## 2022-09-28 MED ORDER — SODIUM CHLORIDE 0.9 % IV SOLN
490.5000 mg | Freq: Once | INTRAVENOUS | Status: AC
  Administered 2022-09-28: 500 mg via INTRAVENOUS
  Filled 2022-09-28: qty 50

## 2022-09-28 NOTE — Patient Instructions (Signed)
[facility] ONCOLOG[facility]  Discharge Instructions: Thank you for choosing [facility] to provide your oncology and hematology care.   If you have a lab appointment with the [facility], please go directly to the [facility] and check in at the registration area.   Wear comfortable clothing and clothing appropriate for easy access to any Portacath or PICC line.   We strive to give you quality time with your provider. You may need to reschedule your appointment if you arrive late (15 or more minutes).  Arriving late affects you and other patients whose appointments are after yours.  Also, if you miss three or more appointments without notifying the office, you may be dismissed from the clinic at the provider's discretion.      For prescription refill requests, have your pharmacy contact our office and allow 72 hours for refills to be completed.    Today you received the following chemotherapy and/or immunotherapy agents: Mvasi, Paclitaxel, Carboplatin.       To help prevent nausea and vomiting after your treatment, we encourage you to take your nausea medication as directed.  BELOW ARE SYMPTOMS THAT SHOULD BE REPORTED IMMEDIATELY: *FEVER GREATER THAN 100.4 F (38 C) OR HIGHER *CHILLS OR SWEATING *NAUSEA AND VOMITING THAT IS NOT CONTROLLED WITH YOUR NAUSEA MEDICATION *UNUSUAL SHORTNESS OF BREATH *UNUSUAL BRUISING OR BLEEDING *URINARY PROBLEMS (pain or burning when urinating, or frequent urination) *BOWEL PROBLEMS (unusual diarrhea, constipation, pain near the anus) TENDERNESS IN MOUTH AND THROAT WITH OR WITHOUT PRESENCE OF ULCERS (sore throat, sores in mouth, or a toothache) UNUSUAL RASH, SWELLING OR PAIN  UNUSUAL VAGINAL DISCHARGE OR ITCHING   Items with * indicate a potential emergency and should be followed up as soon as possible or go to the Emergency Department if any problems should occur.  Please show the CHEMOTHERAPY ALERT CARD or IMMUNOTHERAPY  ALERT CARD at check-in to the Emergency Department and triage nurse.  Should you have questions after your visit or need to cancel or reschedule your appointment, please contact [facility]  Dept: [phone]  and follow the prompts.  Office hours are 8:00 a.m. to 4:30 p.m. Monday - Friday. Please note that voicemails left after 4:00 p.m. may not be returned until the following business day.  We are closed weekends and major holidays. You have access to a nurse at all times for urgent questions. Please call the main number to the clinic Dept: [phone] and follow the prompts.   For any non-urgent questions, you may also contact your provider using MyChart. We now offer e-Visits for anyone 88 and older to request care online for non-urgent symptoms. For details visit [email or web address].   Also download the MyChart app! Go to the app store, search "MyChart", open the app, select [facility], and log in with your MyChart username and password.

## 2022-09-28 NOTE — Assessment & Plan Note (Signed)
I gave her small dose of insulin today We discussed importance of dietary modification while on treatment

## 2022-09-28 NOTE — Progress Notes (Signed)
Per Alvy Bimler MD, ok to treat with HR 115

## 2022-09-28 NOTE — Assessment & Plan Note (Signed)
She has intermittent elevated serum creatinine She has multiple risk factors including diabetes and hypertension I will adjust the dose of her treatment accordingly

## 2022-09-28 NOTE — Progress Notes (Signed)
[facility] OFFICE PROGRESS NOTE  Patient Care Team: Alvester Chou, NP as PCP - General (Nurse Practitioner)  ASSESSMENT & PLAN:  Cervical cancer [facility]) She tolerated last cycle of treatment well except for muscle ache related to treatment She denies peripheral neuropathy We reviewed her recent molecular testing which show PD-L1 positive testing, with CPS score at 30% I recommend addition of pembrolizumab for future treatment We discussed risk, benefits, side effects of pembrolizumab and she is in agreement to proceed She has significant steroid-induced hyperglycemia We discussed importance of dietary modification while on treatment  Diabetes mellitus without complication ([place]) I gave her small dose of insulin today We discussed importance of dietary modification while on treatment  Arthralgia Her bone age is due to side effects of treatment I gave her a small prescription of pain medicine to take as needed  Chronic kidney disease (CKD), stage III (moderate) ([place]) She has intermittent elevated serum creatinine She has multiple risk factors including diabetes and hypertension I will adjust the dose of her treatment accordingly  Orders Placed This Encounter  Procedures   TSH    Standing Status:   Future    Standing Expiration Date:   10/19/2023   T4    Standing Status:   Future    Standing Expiration Date:   10/19/2023   TSH    Standing Status:   Future    Standing Expiration Date:   11/09/2023   T4    Standing Status:   Future    Standing Expiration Date:   11/09/2023   TSH    Standing Status:   Future    Standing Expiration Date:   11/30/2023   T4    Standing Status:   Future    Standing Expiration Date:   11/30/2023   TSH    Standing Status:   Future    Standing Expiration Date:   12/21/2023   T4    Standing Status:   Future    Standing Expiration Date:   12/21/2023   TSH    Standing Status:   Future    Standing Expiration Date:   01/11/2024   T4    Standing  Status:   Future    Standing Expiration Date:   01/11/2024   TSH    Standing Status:   Future    Standing Expiration Date:   02/01/2024   T4    Standing Status:   Future    Standing Expiration Date:   02/01/2024    All questions were answered. The patient knows to call the clinic with any problems, questions or concerns. The total time spent in the appointment was 40 minutes encounter with patients including review of chart and various tests results, discussions about plan of care and coordination of care plan   Heath Lark, MD 09/28/2022 4:15 PM  INTERVAL HISTORY: Please see below for problem oriented charting. she returns for treatment follow-up seen prior to cycle 2 of treatment She complained of bone aches with treatment No peripheral neuropathy Denies nausea or constipation We discussed recent molecular testing and implication for future treatment  REVIEW OF SYSTEMS:   Constitutional: Denies fevers, chills or abnormal weight loss Eyes: Denies blurriness of vision Ears, nose, mouth, throat, and face: Denies mucositis or sore throat Respiratory: Denies cough, dyspnea or wheezes Cardiovascular: Denies palpitation, chest discomfort or lower extremity swelling Gastrointestinal:  Denies nausea, heartburn or change in bowel habits Skin: Denies abnormal skin rashes Lymphatics: Denies new lymphadenopathy or easy bruising  Neurological:Denies numbness, tingling or new weaknesses Behavioral/Psych: Mood is stable, no new changes  All other systems were reviewed with the patient and are negative.  I have reviewed the past medical history, past surgical history, social history and family history with the patient and they are unchanged from previous note.  ALLERGIES:  has No Known Allergies.  MEDICATIONS:  Current Outpatient Medications  Medication Sig Dispense Refill   traMADol (ULTRAM) 50 MG tablet Take 1 tablet (50 mg total) by mouth every 6 (six) hours as needed. 30 tablet 0   calcium  carbonate (TUMS - DOSED IN MG ELEMENTAL CALCIUM) 500 MG chewable tablet Chew 1 tablet by mouth daily as needed for indigestion or heartburn.     dexamethasone (DECADRON) 4 MG tablet Take 2 tabs at the night before and 2 tab the morning of chemotherapy, every 3 weeks, by mouth x 6 cycles 36 tablet 6   ezetimibe (ZETIA) 10 MG tablet Take 10 mg by mouth in the morning.     glimepiride (AMARYL) 1 MG tablet Take 1 mg by mouth 2 (two) times daily.     lidocaine-prilocaine (EMLA) cream Apply 1 Application topically as needed. Use for port access 30 g 1   lidocaine-prilocaine (EMLA) cream Apply to affected area once 30 g 3   lisinopril (ZESTRIL) 5 MG tablet Take 5 mg by mouth in the morning.     magnesium oxide (MAG-OX) 400 (241.3 Mg) MG tablet Take 1 tablet (400 mg total) by mouth 2 (two) times daily. 60 tablet 11   ondansetron (ZOFRAN) 8 MG tablet Take 1 tablet (8 mg total) by mouth every 8 (eight) hours as needed for nausea or vomiting. Start on the third day after chemotherapy. 30 tablet 1   prochlorperazine (COMPAZINE) 10 MG tablet Take 1 tablet (10 mg total) by mouth every 6 (six) hours as needed for nausea or vomiting. 30 tablet 1   rosuvastatin (CRESTOR) 40 MG tablet Take 40 mg by mouth at bedtime.     sitaGLIPtin (JANUVIA) 100 MG tablet Take 50 mg by mouth in the morning.     vitamin B-12 (CYANOCOBALAMIN) 1000 MCG tablet Take 1,000 mcg by mouth in the morning.     No current facility-administered medications for this visit.   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   08/27/2020 Imaging   MRI pelvis Large cervical mass with pelvic adenopathy, at least T2B N1 disease. Question of involvement of posterior urinary bladder (T4) for which additional images have been requested. Patient will return for additional images to answer this question. (Thinner section axial T2 without fat sat and sagittal T2 weighted imaging also without fat saturation.)   Small field-of-view images show that the parametrial extension comes  in very close proximity to the RIGHT ureter.   08/30/2020 Initial Diagnosis   Cervical cancer ([place])   08/30/2020 Cancer Staging   Staging form: Cervix Uteri, AJCC Version 9 - Clinical stage from 08/30/2020: FIGO Stage IVB (rcT2, cN1, pM1) - Signed by Heath Lark, MD on 09/01/2022 Stage prefix: Recurrence   09/01/2020 PET scan   1. Large cervical mass extending into the endometrial canal to the level of the uterine fundus, associated with pelvic adenopathy to the level of the RIGHT common iliac chain. Please refer to MRI for further detail. 2. No signs of solid organ uptake or metastatic disease to the chest. 3. Subcutaneous nodule with marked increased metabolic activity, correlation with direct clinical inspection is suggested to exclude cutaneous neoplasm or subcutaneous nodule in this location. Complicated sebaceous cyst could also potentially have this appearance. 4. Uptake in the anal canal could likely physiologic. Given the intense nature of the uptake would suggest correlation with direct clinical inspection/examination. 5. Uptake in axillary lymph nodes with activity less than mediastinal blood pool likely small reactive lymph  nodes, attention on follow-up. Morphologic features also with benign appearance.   09/16/2020 Procedure   Successful placement of a power injectable Port-A-Cath via the right internal jugular vein. The catheter is ready for immediate use.     09/20/2020 - 10/25/2020 Chemotherapy   She received cisplatin chemo with concurrent radiation       09/22/2020 - 12/08/2020 Radiation Therapy   Radiation Treatment Dates: 09/22/2020 through 12/08/2020   Site: Pelvis Technique: IMRT Total Dose (Gy): 45/45 Dose per Fx (Gy): 1.8 Completed Fx: 25/25 Beam Energies: 6x   Site: pelvic boost Technique: IMRT Total Dose (Gy): 7.2/7.2 Dose per Fx (Gy): 1.8 Completed Fx: 4/4 Beam Energies: 6x   Site: Cervix boost Technique: HDR-brachytherapy Total Dose (Gy): 27.5/27.5 Dose per Fx (Gy): 5.5 Completed Fx: 5/5 Beam Energies: Ir-192     03/17/2021 PET scan   1. Mark positive response to therapy. 2. Resolution of metabolic activity within the uterine body. 3. Resolution of size and metabolic activity of pelvic lymph nodes. 4. No evidence of new disease outside of the pelvis.   08/10/2022 PET scan   1. New hypermetabolic bilateral pulmonary nodules and hypermetabolic cervical and hilar lymph nodes, consistent with metastatic disease. 2. Hypermetabolic focus in the anterior right third costochondral junction without discrete CT correlate is also compatible with metastatic disease. 3. Prior hysterectomy without suspicious hypermetabolic nodularity along the vaginal cuff. 4.  Aortic Atherosclerosis (ICD10-I70.0).   08/24/2022 Pathology Results   A. SOFT TISSUE NODULE, RIGHT CHEST WALL, NEEDLE CORE BIOPSY:  Poorly differentiated carcinoma.  See comment.   COMMENT:  The carcinoma is characterized by nest of malignant epithelioid cells which focally has features suggestive of squamous differentiation morphologically.  Immunohistochemistry is positive with cytokeratin 7, PAX8, MOC-31 and estrogen receptor.   There is patchy positivity with p16, progesterone receptor and TTF-1.  The tumor is negative with p40, cytokeratin 5/6, p63, CDX2, cytokeratin 20 and WT-1.  The immunophenotype is consistent with adenocarcinoma and suggestive of a gynecologic primary.    09/08/2022 -  Chemotherapy   Patient is on Treatment Plan : OVARIAN Carboplatin + Paclitaxel + Bevacizumab q21d      Malignant neoplasm of cervix ([place])  09/01/2020 Initial Diagnosis   Malignant neoplasm of cervix ([place])   09/20/2020 - 10/25/2020 Chemotherapy   She received cisplatin chemo with concurrent radiation       09/22/2020 - 12/08/2020 Radiation Therapy   Radiation Treatment Dates:  09/22/2020 through 12/08/2020   Site: Pelvis Technique: IMRT Total Dose (Gy): 45/45 Dose per Fx (Gy): 1.8 Completed Fx: 25/25 Beam Energies: 6x   Site: pelvic boost Technique: IMRT Total Dose (Gy): 7.2/7.2 Dose per Fx (Gy): 1.8 Completed Fx: 4/4 Beam Energies: 6x   Site: Cervix boost Technique: HDR-brachytherapy Total Dose (Gy): 27.5/27.5 Dose per Fx (Gy): 5.5 Completed Fx: 5/5 Beam Energies: Ir-192   09/08/2022 -  Chemotherapy   Patient is on Treatment Plan : OVARIAN Carboplatin + Paclitaxel + Bevacizumab q21d        PHYSICAL EXAMINATION: ECOG PERFORMANCE STATUS: 1 - Symptomatic but completely ambulatory  Vitals:   09/28/22 0932  BP: (!) 147/91  Pulse: (!) 138  Resp: 18  Temp: 98.2 F (36.8 C)  SpO2: 98%   Filed Weights   09/28/22 0932  Weight: 214 lb 6.4 oz (97.3 kg)    GENERAL:alert, no distress and comfortable NEURO: alert & oriented x 3 with fluent speech, no focal motor/sensory deficits  LABORATORY DATA:  I have reviewed the data as listed    Component Value Date/Time   NA 135 09/28/2022 0915   K 4.4 09/28/2022 0915   CL 98 09/28/2022 0915   CO2 26 09/28/2022 0915   GLUCOSE 322 (H) 09/28/2022 0915   BUN 17 09/28/2022 0915   CREATININE 1.15 (H) 09/28/2022 0915   CALCIUM 9.9 09/28/2022 0915   PROT 7.0  09/28/2022 0915   ALBUMIN 3.9 09/28/2022 0915   AST 17 09/28/2022 0915   ALT 17 09/28/2022 0915   ALKPHOS 57 09/28/2022 0915   BILITOT 0.7 09/28/2022 0915   GFRNONAA 51 (L) 09/28/2022 0915    No results found for: "SPEP", "UPEP"  Lab Results  Component Value Date   WBC 5.3 09/28/2022   NEUTROABS 4.8 09/28/2022   HGB 13.5 09/28/2022   HCT 36.6 09/28/2022   MCV 88.4 09/28/2022   PLT 195 09/28/2022      Chemistry      Component Value Date/Time   NA 135 09/28/2022 0915   K 4.4 09/28/2022 0915   CL 98 09/28/2022 0915   CO2 26 09/28/2022 0915   BUN 17 09/28/2022 0915   CREATININE 1.15 (H) 09/28/2022 0915      Component Value Date/Time   CALCIUM 9.9 09/28/2022 0915   ALKPHOS 57 09/28/2022 0915   AST 17 09/28/2022 0915   ALT 17 09/28/2022 0915   BILITOT 0.7 09/28/2022 0915

## 2022-09-28 NOTE — Assessment & Plan Note (Signed)
Her bone age is due to side effects of treatment I gave her a small prescription of pain medicine to take as needed

## 2022-09-28 NOTE — Assessment & Plan Note (Signed)
She tolerated last cycle of treatment well except for muscle ache related to treatment She denies peripheral neuropathy We reviewed her recent molecular testing which show PD-L1 positive testing, with CPS score at 30% I recommend addition of pembrolizumab for future treatment We discussed risk, benefits, side effects of pembrolizumab and she is in agreement to proceed She has significant steroid-induced hyperglycemia We discussed importance of dietary modification while on treatment

## 2022-09-29 ENCOUNTER — Ambulatory Visit: Payer: Medicare PPO

## 2022-09-29 ENCOUNTER — Other Ambulatory Visit: Payer: Self-pay | Admitting: Oncology

## 2022-09-29 NOTE — Progress Notes (Signed)
Misty Deleon called and noticed she has EMLA cream on her list twice.  She requested that one be deleted.

## 2022-10-11 ENCOUNTER — Telehealth: Payer: Self-pay | Admitting: *Deleted

## 2022-10-11 NOTE — Telephone Encounter (Signed)
Patient notified per review Joylene John, NP, does not have to reschedule appointment. Dr Alvy Bimler to let us know if follow-up needed.   Patient notified.

## 2022-10-11 NOTE — Telephone Encounter (Signed)
Patient left voice mail on 10-10-22 at 425 pm advising to cancel her appointment on Friday, 10-13-22 as she "cannot keep it."   Call back to patient. She is out of town while feeling well between chemo treatments. Thinks this is an older appointment from before she was transferred to Dr Alvy Bimler. Has next chemo 2-8 and then again 2-29.This usually keeps her in bed for one week. CT scan on 2-27  Patient asking if/when she should be seen again with Dr Berline Lopes?

## 2022-10-13 ENCOUNTER — Ambulatory Visit: Payer: Medicare PPO | Admitting: Gynecologic Oncology

## 2022-10-18 MED FILL — Fosaprepitant Dimeglumine For IV Infusion 150 MG (Base Eq): INTRAVENOUS | Qty: 5 | Status: AC

## 2022-10-18 MED FILL — Dexamethasone Sodium Phosphate Inj 100 MG/10ML: INTRAMUSCULAR | Qty: 1 | Status: AC

## 2022-10-19 ENCOUNTER — Other Ambulatory Visit: Payer: Self-pay

## 2022-10-19 ENCOUNTER — Other Ambulatory Visit: Payer: Self-pay | Admitting: *Deleted

## 2022-10-19 ENCOUNTER — Inpatient Hospital Stay (HOSPITAL_BASED_OUTPATIENT_CLINIC_OR_DEPARTMENT_OTHER): Payer: Medicare PPO | Admitting: Hematology and Oncology

## 2022-10-19 ENCOUNTER — Inpatient Hospital Stay: Payer: Medicare PPO

## 2022-10-19 ENCOUNTER — Encounter: Payer: Self-pay | Admitting: Hematology and Oncology

## 2022-10-19 ENCOUNTER — Inpatient Hospital Stay: Payer: Medicare PPO | Attending: Radiation Oncology

## 2022-10-19 VITALS — BP 168/99 | HR 104 | Temp 98.1°F | Resp 16 | Wt 213.5 lb

## 2022-10-19 DIAGNOSIS — D61818 Other pancytopenia: Secondary | ICD-10-CM

## 2022-10-19 DIAGNOSIS — Z7984 Long term (current) use of oral hypoglycemic drugs: Secondary | ICD-10-CM | POA: Diagnosis not present

## 2022-10-19 DIAGNOSIS — Z9221 Personal history of antineoplastic chemotherapy: Secondary | ICD-10-CM | POA: Insufficient documentation

## 2022-10-19 DIAGNOSIS — E1122 Type 2 diabetes mellitus with diabetic chronic kidney disease: Secondary | ICD-10-CM | POA: Diagnosis not present

## 2022-10-19 DIAGNOSIS — C539 Malignant neoplasm of cervix uteri, unspecified: Secondary | ICD-10-CM

## 2022-10-19 DIAGNOSIS — Z923 Personal history of irradiation: Secondary | ICD-10-CM | POA: Insufficient documentation

## 2022-10-19 DIAGNOSIS — C538 Malignant neoplasm of overlapping sites of cervix uteri: Secondary | ICD-10-CM | POA: Diagnosis not present

## 2022-10-19 DIAGNOSIS — E538 Deficiency of other specified B group vitamins: Secondary | ICD-10-CM | POA: Diagnosis not present

## 2022-10-19 DIAGNOSIS — Z5112 Encounter for antineoplastic immunotherapy: Secondary | ICD-10-CM | POA: Insufficient documentation

## 2022-10-19 DIAGNOSIS — Z5111 Encounter for antineoplastic chemotherapy: Secondary | ICD-10-CM | POA: Diagnosis present

## 2022-10-19 DIAGNOSIS — I129 Hypertensive chronic kidney disease with stage 1 through stage 4 chronic kidney disease, or unspecified chronic kidney disease: Secondary | ICD-10-CM | POA: Diagnosis not present

## 2022-10-19 DIAGNOSIS — N183 Chronic kidney disease, stage 3 unspecified: Secondary | ICD-10-CM | POA: Diagnosis not present

## 2022-10-19 DIAGNOSIS — Z79899 Other long term (current) drug therapy: Secondary | ICD-10-CM | POA: Insufficient documentation

## 2022-10-19 DIAGNOSIS — E119 Type 2 diabetes mellitus without complications: Secondary | ICD-10-CM

## 2022-10-19 DIAGNOSIS — E1165 Type 2 diabetes mellitus with hyperglycemia: Secondary | ICD-10-CM | POA: Diagnosis not present

## 2022-10-19 LAB — CMP (CANCER CENTER ONLY)
ALT: 13 U/L (ref 0–44)
AST: 16 U/L (ref 15–41)
Albumin: 4 g/dL (ref 3.5–5.0)
Alkaline Phosphatase: 57 U/L (ref 38–126)
Anion gap: 10 (ref 5–15)
BUN: 21 mg/dL (ref 8–23)
CO2: 26 mmol/L (ref 22–32)
Calcium: 9.8 mg/dL (ref 8.9–10.3)
Chloride: 100 mmol/L (ref 98–111)
Creatinine: 1.05 mg/dL — ABNORMAL HIGH (ref 0.44–1.00)
GFR, Estimated: 57 mL/min — ABNORMAL LOW (ref >60)
Glucose, Bld: 305 mg/dL — ABNORMAL HIGH (ref 70–99)
Potassium: 4.1 mmol/L (ref 3.5–5.1)
Sodium: 136 mmol/L (ref 135–145)
Total Bilirubin: 0.7 mg/dL (ref 0.3–1.2)
Total Protein: 6.9 g/dL (ref 6.5–8.1)

## 2022-10-19 LAB — CBC WITH DIFFERENTIAL (CANCER CENTER ONLY)
Abs Immature Granulocytes: 0.04 10*3/uL (ref 0.00–0.07)
Basophils Absolute: 0 10*3/uL (ref 0.0–0.1)
Basophils Relative: 0 %
Eosinophils Absolute: 0 10*3/uL (ref 0.0–0.5)
Eosinophils Relative: 0 %
HCT: 30.6 % — ABNORMAL LOW (ref 36.0–46.0)
Hemoglobin: 10.9 g/dL — ABNORMAL LOW (ref 12.0–15.0)
Immature Granulocytes: 1 %
Lymphocytes Relative: 8 %
Lymphs Abs: 0.4 10*3/uL — ABNORMAL LOW (ref 0.7–4.0)
MCH: 32.2 pg (ref 26.0–34.0)
MCHC: 35.6 g/dL (ref 30.0–36.0)
MCV: 90.5 fL (ref 80.0–100.0)
Monocytes Absolute: 0 10*3/uL — ABNORMAL LOW (ref 0.1–1.0)
Monocytes Relative: 1 %
Neutro Abs: 4.8 10*3/uL (ref 1.7–7.7)
Neutrophils Relative %: 90 %
Platelet Count: 140 10*3/uL — ABNORMAL LOW (ref 150–400)
RBC: 3.38 MIL/uL — ABNORMAL LOW (ref 3.87–5.11)
RDW: 15.8 % — ABNORMAL HIGH (ref 11.5–15.5)
Smear Review: NORMAL
WBC Count: 5.2 10*3/uL (ref 4.0–10.5)
nRBC: 0 % (ref 0.0–0.2)

## 2022-10-19 LAB — TOTAL PROTEIN, URINE DIPSTICK: Protein, ur: 30 mg/dL — AB

## 2022-10-19 LAB — VITAMIN B12: Vitamin B-12: 1176 pg/mL — ABNORMAL HIGH (ref 180–914)

## 2022-10-19 LAB — TSH: TSH: 1.288 u[IU]/mL (ref 0.350–4.500)

## 2022-10-19 MED ORDER — SODIUM CHLORIDE 0.9 % IV SOLN: Freq: Once | INTRAVENOUS | Status: AC

## 2022-10-19 MED ORDER — FAMOTIDINE IN NACL 20-0.9 MG/50ML-% IV SOLN
20.0000 mg | Freq: Once | INTRAVENOUS | Status: AC
  Administered 2022-10-19: 20 mg via INTRAVENOUS
  Filled 2022-10-19: qty 50

## 2022-10-19 MED ORDER — SODIUM CHLORIDE 0.9% FLUSH
10.0000 mL | INTRAVENOUS | Status: DC | PRN
  Administered 2022-10-19: 10 mL

## 2022-10-19 MED ORDER — SODIUM CHLORIDE 0.9 % IV SOLN
175.0000 mg/m2 | Freq: Once | INTRAVENOUS | Status: AC
  Administered 2022-10-19: 360 mg via INTRAVENOUS
  Filled 2022-10-19: qty 60

## 2022-10-19 MED ORDER — SODIUM CHLORIDE 0.9 % IV SOLN
200.0000 mg | Freq: Once | INTRAVENOUS | Status: AC
  Administered 2022-10-19: 200 mg via INTRAVENOUS
  Filled 2022-10-19: qty 8

## 2022-10-19 MED ORDER — SODIUM CHLORIDE 0.9 % IV SOLN
10.0000 mg | Freq: Once | INTRAVENOUS | Status: AC
  Administered 2022-10-19: 10 mg via INTRAVENOUS
  Filled 2022-10-19: qty 10

## 2022-10-19 MED ORDER — INSULIN ASPART 100 UNIT/ML IJ SOLN
12.0000 [IU] | Freq: Once | INTRAMUSCULAR | Status: AC
  Administered 2022-10-19: 12 [IU] via SUBCUTANEOUS
  Filled 2022-10-19: qty 1

## 2022-10-19 MED ORDER — CETIRIZINE HCL 10 MG/ML IV SOLN
10.0000 mg | Freq: Once | INTRAVENOUS | Status: AC
  Administered 2022-10-19: 10 mg via INTRAVENOUS
  Filled 2022-10-19: qty 1

## 2022-10-19 MED ORDER — SODIUM CHLORIDE 0.9 % IV SOLN
490.5000 mg | Freq: Once | INTRAVENOUS | Status: AC
  Administered 2022-10-19: 500 mg via INTRAVENOUS
  Filled 2022-10-19: qty 50

## 2022-10-19 MED ORDER — SODIUM CHLORIDE 0.9 % IV SOLN
150.0000 mg | Freq: Once | INTRAVENOUS | Status: AC
  Administered 2022-10-19: 150 mg via INTRAVENOUS
  Filled 2022-10-19: qty 150

## 2022-10-19 MED ORDER — SODIUM CHLORIDE 0.9% FLUSH
10.0000 mL | Freq: Once | INTRAVENOUS | Status: AC
  Administered 2022-10-19: 10 mL

## 2022-10-19 MED ORDER — DEXAMETHASONE 4 MG PO TABS: ORAL_TABLET | ORAL | 6 refills | Status: DC

## 2022-10-19 MED ORDER — PALONOSETRON HCL INJECTION 0.25 MG/5ML
0.2500 mg | Freq: Once | INTRAVENOUS | Status: AC
  Administered 2022-10-19: 0.25 mg via INTRAVENOUS
  Filled 2022-10-19: qty 5

## 2022-10-19 MED ORDER — SODIUM CHLORIDE 0.9 % IV SOLN
15.0000 mg/kg | Freq: Once | INTRAVENOUS | Status: AC
  Administered 2022-10-19: 1500 mg via INTRAVENOUS
  Filled 2022-10-19: qty 12

## 2022-10-19 MED ORDER — HEPARIN SOD (PORK) LOCK FLUSH 100 UNIT/ML IV SOLN
500.0000 [IU] | Freq: Once | INTRAVENOUS | Status: AC | PRN
  Administered 2022-10-19: 500 [IU]

## 2022-10-19 NOTE — Assessment & Plan Note (Signed)
Overall, she tolerated chemo well except for borderline elevated blood pressure and hyperglycemia Moving forward, I recommend reduced dose oral premed dexamethasone in the future She is scheduled for imaging study to be done prior to her next infusion She will get additional treatment with insulin due to severe hyperglycemia

## 2022-10-19 NOTE — Assessment & Plan Note (Signed)
She has intermittent elevated serum creatinine She has multiple risk factors including diabetes and hypertension I will adjust the dose of her treatment accordingly

## 2022-10-19 NOTE — Assessment & Plan Note (Signed)
I gave her small dose of insulin today We discussed importance of dietary modification while on treatment

## 2022-10-19 NOTE — Assessment & Plan Note (Signed)
She has history of B12 deficiency I will recheck vitamin B12 level today

## 2022-10-19 NOTE — Patient Instructions (Signed)
Misty Deleon  Discharge Instructions: Thank you for choosing [facility] to provide your oncology and hematology care.   If you have a lab appointment with the [facility], please go directly to the [facility] and check in at the registration area.   Wear comfortable clothing and clothing appropriate for easy access to any Portacath or PICC line.   We strive to give you quality time with your provider. You may need to reschedule your appointment if you arrive late (15 or more minutes).  Arriving late affects you and other patients whose appointments are after yours.  Also, if you miss three or more appointments without notifying the office, you may be dismissed from the clinic at the provider's discretion.      For prescription refill requests, have your pharmacy contact our office and allow 72 hours for refills to be completed.    Today you received the following chemotherapy and/or immunotherapy agents Keytruda, Mvasi, Taxol & Carbo      To help prevent nausea and vomiting after your treatment, we encourage you to take your nausea medication as directed.  BELOW ARE SYMPTOMS THAT SHOULD BE REPORTED IMMEDIATELY: *FEVER GREATER THAN 100.4 F (38 C) OR HIGHER *CHILLS OR SWEATING *NAUSEA AND VOMITING THAT IS NOT CONTROLLED WITH YOUR NAUSEA MEDICATION *UNUSUAL SHORTNESS OF BREATH *UNUSUAL BRUISING OR BLEEDING *URINARY PROBLEMS (pain or burning when urinating, or frequent urination) *BOWEL PROBLEMS (unusual diarrhea, constipation, pain near the anus) TENDERNESS IN MOUTH AND THROAT WITH OR WITHOUT PRESENCE OF ULCERS (sore throat, sores in mouth, or a toothache) UNUSUAL RASH, SWELLING OR PAIN  UNUSUAL VAGINAL DISCHARGE OR ITCHING   Items with * indicate a potential emergency and should be followed up as soon as possible or go to the Emergency Department if any problems should occur.  Please show the CHEMOTHERAPY ALERT CARD or IMMUNOTHERAPY  ALERT CARD at check-in to the Emergency Department and triage nurse.  Should you have questions after your visit or need to cancel or reschedule your appointment, please contact [facility]  Dept: [phone]  and follow the prompts.  Office hours are 8:00 a.m. to 4:30 p.m. Monday - Friday. Please note that voicemails left after 4:00 p.m. may not be returned until the following business day.  We are closed weekends and major holidays. You have access to a nurse at all times for urgent questions. Please call the main number to the clinic Dept: [phone] and follow the prompts.   For any non-urgent questions, you may also contact your provider using MyChart. We now offer e-Visits for anyone 5 and older to request care online for non-urgent symptoms. For details visit [email or web address].   Also download the MyChart app! Go to the app store, search "MyChart", open the app, select [facility], and log in with your MyChart username and password.  Pembrolizumab Injection What is this medication? PEMBROLIZUMAB (PEM broe LIZ ue mab) treats some types of cancer. It works by helping your immune system slow or stop the spread of cancer cells. It is a monoclonal antibody. This medicine may be used for other purposes; ask your health care provider or pharmacist if you have questions. COMMON BRAND NAME(S): Keytruda What should I tell my care team before I take this medication? They need to know if you have any of these conditions: Allogeneic stem cell transplant (uses someone else's stem cells) Autoimmune diseases, such as Crohn disease, ulcerative colitis, lupus History  of chest radiation Nervous system problems, such as Guillain-Barre syndrome, myasthenia gravis Organ transplant An unusual or allergic reaction to pembrolizumab, other medications, foods, dyes, or preservatives Pregnant or trying to get pregnant Breast-feeding How should I use this  medication? This medication is injected into a vein. It is given by your care team in a hospital or clinic setting. A special MedGuide will be given to you before each treatment. Be sure to read this information carefully each time. Talk to your care team about the use of this medication in children. While it may be prescribed for children as young as 6 months for selected conditions, precautions do apply. Overdosage: If you think you have taken too much of this medicine contact a poison control center or emergency room at once. NOTE: This medicine is only for you. Do not share this medicine with others. What if I miss a dose? Keep appointments for follow-up doses. It is important not to miss your dose. Call your care team if you are unable to keep an appointment. What may interact with this medication? Interactions have not been studied. This list may not describe all possible interactions. Give your health care provider a list of all the medicines, herbs, non-prescription drugs, or dietary supplements you use. Also tell them if you smoke, drink alcohol, or use illegal drugs. Some items may interact with your medicine. What should I watch for while using this medication? Your condition will be monitored carefully while you are receiving this medication. You may need blood work while taking this medication. This medication may cause serious skin reactions. They can happen weeks to months after starting the medication. Contact your care team right away if you notice fevers or flu-like symptoms with a rash. The rash may be red or purple and then turn into blisters or peeling of the skin. You may also notice a red rash with swelling of the face, lips, or lymph nodes in your neck or under your arms. Tell your care team right away if you have any change in your eyesight. Talk to your care team if you may be pregnant. Serious birth defects can occur if you take this medication during pregnancy and for 4  months after the last dose. You will need a negative pregnancy test before starting this medication. Contraception is recommended while taking this medication and for 4 months after the last dose. Your care team can help you find the option that works for you. Do not breastfeed while taking this medication and for 4 months after the last dose. What side effects may I notice from receiving this medication? Side effects that you should report to your care team as soon as possible: Allergic reactions--skin rash, itching, hives, swelling of the face, lips, tongue, or throat Dry cough, shortness of breath or trouble breathing Eye pain, redness, irritation, or discharge with blurry or decreased vision Heart muscle inflammation--unusual weakness or fatigue, shortness of breath, chest pain, fast or irregular heartbeat, dizziness, swelling of the ankles, feet, or hands Hormone gland problems--headache, sensitivity to light, unusual weakness or fatigue, dizziness, fast or irregular heartbeat, increased sensitivity to cold or heat, excessive sweating, constipation, hair loss, increased thirst or amount of urine, tremors or shaking, irritability Infusion reactions--chest pain, shortness of breath or trouble breathing, feeling faint or lightheaded Kidney injury (glomerulonephritis)--decrease in the amount of urine, red or dark brown urine, foamy or bubbly urine, swelling of the ankles, hands, or feet Liver injury--right upper belly pain, loss of appetite, nausea, light-colored  stool, dark yellow or brown urine, yellowing skin or eyes, unusual weakness or fatigue Pain, tingling, or numbness in the hands or feet, muscle weakness, change in vision, confusion or trouble speaking, loss of balance or coordination, trouble walking, seizures Rash, fever, and swollen lymph nodes Redness, blistering, peeling, or loosening of the skin, including inside the mouth Sudden or severe stomach pain, bloody diarrhea, fever, nausea,  vomiting Side effects that usually do not require medical attention (report to your care team if they continue or are bothersome): Bone, joint, or muscle pain Diarrhea Fatigue Loss of appetite Nausea Skin rash This list may not describe all possible side effects. Call your doctor for medical advice about side effects. You may report side effects to FDA at 1-800-FDA-1088. Where should I keep my medication? This medication is given in a hospital or clinic. It will not be stored at home. NOTE: This sheet is a summary. It may not cover all possible information. If you have questions about this medicine, talk to your doctor, pharmacist, or health care provider.  2023 Elsevier/Gold Standard (2013-05-19 00:00:00)

## 2022-10-19 NOTE — Progress Notes (Signed)
[facility] OFFICE PROGRESS NOTE  Patient Care Team: Alvester Chou, NP as PCP - General (Nurse Practitioner)  ASSESSMENT & PLAN:  Cervical cancer ([place]) Overall, she tolerated chemo well except for borderline elevated blood pressure and hyperglycemia Moving forward, I recommend reduced dose oral premed dexamethasone in the future She is scheduled for imaging study to be done prior to her next infusion She will get additional treatment with insulin due to severe hyperglycemia  Vitamin B12 deficiency She has history of B12 deficiency I will recheck vitamin B12 level today  Chronic kidney disease (CKD), stage III (moderate) ([place]) She has intermittent elevated serum creatinine She has multiple risk factors including diabetes and hypertension I will adjust the dose of her treatment accordingly  Diabetes mellitus without complication ([place]) I gave her small dose of insulin today We discussed importance of dietary modification while on treatment  Orders Placed This Encounter  Procedures   Vitamin B12    Standing Status:   Future    Standing Expiration Date:   10/20/2023    All questions were answered. The patient knows to call the clinic with any problems, questions or concerns. The total time spent in the appointment was 30 minutes encounter with patients including review of chart and various tests results, discussions about plan of care and coordination of care plan   Heath Lark, MD 10/19/2022 11:27 AM  INTERVAL HISTORY: Please see below for problem oriented charting. she returns for treatment follow-up  She is seen in the infusion room According to the patient, her documented blood pressure at home were within normal range She denies worsening peripheral neuropathy  REVIEW OF SYSTEMS:   Constitutional: Denies fevers, chills or abnormal weight loss Eyes: Denies blurriness of vision Ears, nose, mouth, throat, and face: Denies mucositis or sore throat Respiratory:  Denies cough, dyspnea or wheezes Cardiovascular: Denies palpitation, chest discomfort or lower extremity swelling Gastrointestinal:  Denies nausea, heartburn or change in bowel habits Skin: Denies abnormal skin rashes Lymphatics: Denies new lymphadenopathy or easy bruising Neurological:Denies numbness, tingling or new weaknesses Behavioral/Psych: Mood is stable, no new changes  All other systems were reviewed with the patient and are negative.  I have reviewed the past medical history, past surgical history, social history and family history with the patient and they are unchanged from previous note.  ALLERGIES:  has No Known Allergies.  MEDICATIONS:  Current Outpatient Medications  Medication Sig Dispense Refill   calcium carbonate (TUMS - DOSED IN MG ELEMENTAL CALCIUM) 500 MG chewable tablet Chew 1 tablet by mouth daily as needed for indigestion or heartburn.     dexamethasone (DECADRON) 4 MG tablet Take 2 tabs at the night before chemotherapy, every 3 weeks, by mouth x 6 cycles 36 tablet 6   ezetimibe (ZETIA) 10 MG tablet Take 10 mg by mouth in the morning.     glimepiride (AMARYL) 1 MG tablet Take 1 mg by mouth 2 (two) times daily.     lidocaine-prilocaine (EMLA) cream Apply to affected area once 30 g 3   lisinopril (ZESTRIL) 5 MG tablet Take 5 mg by mouth in the morning.     magnesium oxide (MAG-OX) 400 (241.3 Mg) MG tablet Take 1 tablet (400 mg total) by mouth 2 (two) times daily. 60 tablet 11   ondansetron (ZOFRAN) 8 MG tablet Take 1 tablet (8 mg total) by mouth every 8 (eight) hours as needed for nausea or vomiting. Start on the third day after chemotherapy. 30 tablet 1   prochlorperazine (  COMPAZINE) 10 MG tablet Take 1 tablet (10 mg total) by mouth every 6 (six) hours as needed for nausea or vomiting. 30 tablet 1   rosuvastatin (CRESTOR) 40 MG tablet Take 40 mg by mouth at bedtime.     sitaGLIPtin (JANUVIA) 100 MG tablet Take 50 mg by mouth in the morning.     traMADol (ULTRAM) 50  MG tablet Take 1 tablet (50 mg total) by mouth every 6 (six) hours as needed. 30 tablet 0   vitamin B-12 (CYANOCOBALAMIN) 1000 MCG tablet Take 1,000 mcg by mouth in the morning.     No current facility-administered medications for this visit.   Facility-Administered Medications Ordered in Other Visits  Medication Dose Route Frequency Provider Last Rate Last Admin   bevacizumab-awwb (MVASI) 1,500 mg in sodium chloride 0.9 % 100 mL chemo infusion  15 mg/kg (Treatment Plan Recorded) Intravenous Once Alvy Bimler, Nazar Kuan, MD       CARBOplatin (PARAPLATIN) 500 mg in sodium chloride 0.9 % 250 mL chemo infusion  500 mg Intravenous Once Alvy Bimler, Deryn Massengale, MD       heparin lock flush 100 unit/mL  500 Units Intracatheter Once PRN Alvy Bimler, Cambell Rickenbach, MD       PACLitaxel (TAXOL) 360 mg in sodium chloride 0.9 % 500 mL chemo infusion (> '80mg'$ /m2)  175 mg/m2 (Treatment Plan Recorded) Intravenous Once Margarito Dehaas, MD       sodium chloride flush (NS) 0.9 % injection 10 mL  10 mL Intracatheter PRN Heath Lark, MD        SUMMARY OF ONCOLOGIC HISTORY: Oncology History Overview Note  Adenocarcinoma, 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   08/27/2020 Imaging   MRI pelvis Large cervical mass with pelvic adenopathy, at least T2B N1 disease. Question of involvement of posterior urinary bladder (T4) for which additional images have been requested. Patient will return for additional images to answer this question. (Thinner section axial T2 without fat sat and sagittal T2 weighted imaging also without fat saturation.)   Small field-of-view images show that the parametrial extension comes in very close proximity to the RIGHT ureter.   08/30/2020 Initial Diagnosis   Cervical cancer ([place])   08/30/2020 Cancer Staging   Staging form: Cervix Uteri, AJCC  Version 9 - Clinical stage from 08/30/2020: FIGO Stage IVB (rcT2, cN1, pM1) - Signed by Heath Lark, MD on 09/01/2022 Stage prefix: Recurrence   09/01/2020 PET scan   1. Large cervical mass extending into the endometrial canal to the level of the uterine fundus, associated with pelvic adenopathy to the level of the RIGHT common iliac chain. Please refer to MRI for further detail. 2. No signs of solid organ uptake or metastatic disease to the chest. 3. Subcutaneous nodule with marked increased metabolic activity, correlation with direct clinical inspection is suggested to exclude cutaneous neoplasm or subcutaneous nodule in this location. Complicated sebaceous cyst could also potentially have this appearance. 4. Uptake in the anal canal could likely physiologic. Given the intense nature of the uptake would suggest correlation with direct clinical inspection/examination. 5. Uptake in axillary lymph nodes with activity less than mediastinal blood pool likely small reactive lymph nodes, attention on follow-up. Morphologic features also with benign appearance.   09/16/2020 Procedure   Successful placement of a power injectable Port-A-Cath via the right internal jugular vein. The catheter is ready for immediate use.  09/20/2020 - 10/25/2020 Chemotherapy   She received cisplatin chemo with concurrent radiation       09/22/2020 - 12/08/2020 Radiation Therapy   Radiation Treatment Dates: 09/22/2020 through 12/08/2020   Site: Pelvis Technique: IMRT Total Dose (Gy): 45/45 Dose per Fx (Gy): 1.8 Completed Fx: 25/25 Beam Energies: 6x   Site: pelvic boost Technique: IMRT Total Dose (Gy): 7.2/7.2 Dose per Fx (Gy): 1.8 Completed Fx: 4/4 Beam Energies: 6x   Site: Cervix boost Technique: HDR-brachytherapy Total Dose (Gy): 27.5/27.5 Dose per Fx (Gy): 5.5 Completed Fx: 5/5 Beam Energies: Ir-192     03/17/2021 PET scan   1. Mark positive response to therapy. 2. Resolution of metabolic activity  within the uterine body. 3. Resolution of size and metabolic activity of pelvic lymph nodes. 4. No evidence of new disease outside of the pelvis.   08/10/2022 PET scan   1. New hypermetabolic bilateral pulmonary nodules and hypermetabolic cervical and hilar lymph nodes, consistent with metastatic disease. 2. Hypermetabolic focus in the anterior right third costochondral junction without discrete CT correlate is also compatible with metastatic disease. 3. Prior hysterectomy without suspicious hypermetabolic nodularity along the vaginal cuff. 4.  Aortic Atherosclerosis (ICD10-I70.0).   08/24/2022 Pathology Results   A. SOFT TISSUE NODULE, RIGHT CHEST WALL, NEEDLE CORE BIOPSY:  Poorly differentiated carcinoma.  See comment.   COMMENT:  The carcinoma is characterized by nest of malignant epithelioid cells which focally has features suggestive of squamous differentiation morphologically.  Immunohistochemistry is positive with cytokeratin 7, PAX8, MOC-31 and estrogen receptor.  There is patchy positivity with p16, progesterone receptor and TTF-1.  The tumor is negative with p40, cytokeratin 5/6, p63, CDX2, cytokeratin 20 and WT-1.  The immunophenotype is consistent with adenocarcinoma and suggestive of a gynecologic primary.    09/08/2022 -  Chemotherapy   Patient is on Treatment Plan : OVARIAN Carboplatin + Paclitaxel + Bevacizumab q21d      Malignant neoplasm of cervix ([place])  09/01/2020 Initial Diagnosis   Malignant neoplasm of cervix ([place])   09/20/2020 - 10/25/2020 Chemotherapy   She received cisplatin chemo with concurrent radiation       09/22/2020 - 12/08/2020 Radiation Therapy   Radiation Treatment Dates: 09/22/2020 through 12/08/2020   Site: Pelvis Technique: IMRT Total Dose (Gy): 45/45 Dose per Fx (Gy): 1.8 Completed Fx: 25/25 Beam Energies: 6x   Site: pelvic boost Technique: IMRT Total Dose (Gy): 7.2/7.2 Dose per Fx (Gy): 1.8 Completed Fx: 4/4 Beam Energies: 6x   Site:  Cervix boost Technique: HDR-brachytherapy Total Dose (Gy): 27.5/27.5 Dose per Fx (Gy): 5.5 Completed Fx: 5/5 Beam Energies: Ir-192   09/08/2022 -  Chemotherapy   Patient is on Treatment Plan : OVARIAN Carboplatin + Paclitaxel + Bevacizumab q21d        PHYSICAL EXAMINATION: ECOG PERFORMANCE STATUS: 1 - Symptomatic but completely ambulatory  GENERAL:alert, no distress and comfortable  NEURO: alert & oriented x 3 with fluent speech, no focal motor/sensory deficits  LABORATORY DATA:  I have reviewed the data as listed    Component Value Date/Time   NA 136 10/19/2022 0749   K 4.1 10/19/2022 0749   CL 100 10/19/2022 0749   CO2 26 10/19/2022 0749   GLUCOSE 305 (H) 10/19/2022 0749   BUN 21 10/19/2022 0749   CREATININE 1.05 (H) 10/19/2022 0749   CALCIUM 9.8 10/19/2022 0749   PROT 6.9 10/19/2022 0749   ALBUMIN 4.0 10/19/2022 0749   AST 16 10/19/2022 0749   ALT 13 10/19/2022 0749   ALKPHOS 57 10/19/2022 0749  BILITOT 0.7 10/19/2022 0749   GFRNONAA 57 (L) 10/19/2022 0749    No results found for: "SPEP", "UPEP"  Lab Results  Component Value Date   WBC 5.2 10/19/2022   NEUTROABS 4.8 10/19/2022   HGB 10.9 (L) 10/19/2022   HCT 30.6 (L) 10/19/2022   MCV 90.5 10/19/2022   PLT 140 (L) 10/19/2022      Chemistry      Component Value Date/Time   NA 136 10/19/2022 0749   K 4.1 10/19/2022 0749   CL 100 10/19/2022 0749   CO2 26 10/19/2022 0749   BUN 21 10/19/2022 0749   CREATININE 1.05 (H) 10/19/2022 0749      Component Value Date/Time   CALCIUM 9.8 10/19/2022 0749   ALKPHOS 57 10/19/2022 0749   AST 16 10/19/2022 0749   ALT 13 10/19/2022 0749   BILITOT 0.7 10/19/2022 0749

## 2022-10-19 NOTE — Progress Notes (Signed)
Per Dr Alvy Bimler ok to proceed with elevated BP & tachycardia today.

## 2022-10-20 ENCOUNTER — Telehealth: Payer: Self-pay

## 2022-10-20 NOTE — Telephone Encounter (Signed)
Misty Deleon states she is doing fine. She is eating, drinking,and urinating well.She knows to call the office at [phone] if she has any questions or concerns.

## 2022-10-20 NOTE — Telephone Encounter (Signed)
-----   Message from Rolene Course, RN sent at 10/19/2022  4:16 PM EST ----- Regarding: Alvy Bimler 1st Tx F/U call - Eulogio Bear 1st Tx F/U call - Keytruda.  Patient also received Mvasi, Taxol & carbo (not first time), tolerated well.

## 2022-10-21 LAB — T4: T4, Total: 9.9 ug/dL (ref 4.5–12.0)

## 2022-10-24 ENCOUNTER — Other Ambulatory Visit: Payer: Medicare PPO

## 2022-10-26 ENCOUNTER — Ambulatory Visit: Payer: Medicare PPO | Admitting: Hematology and Oncology

## 2022-10-27 ENCOUNTER — Telehealth: Payer: Self-pay | Admitting: Oncology

## 2022-10-27 ENCOUNTER — Other Ambulatory Visit: Payer: Self-pay

## 2022-10-27 ENCOUNTER — Inpatient Hospital Stay: Payer: Medicare PPO

## 2022-10-27 DIAGNOSIS — Z5112 Encounter for antineoplastic immunotherapy: Secondary | ICD-10-CM | POA: Diagnosis not present

## 2022-10-27 DIAGNOSIS — R300 Dysuria: Secondary | ICD-10-CM

## 2022-10-27 NOTE — Telephone Encounter (Signed)
Misty Deleon left a message that she has been having some constipation and took a laxative as directed and has had 8 bowel movements in the last 24 hours.  She said now she is having some burning with urination and is wondering if she has a UTI.  She is asking if something can be sent in for a UTI.  Called her back and scheduled a lab appointment for a UA and culture at 4:00 today.

## 2022-10-29 LAB — URINE CULTURE: Culture: >=100000 — AB

## 2022-10-30 ENCOUNTER — Telehealth: Payer: Self-pay

## 2022-10-30 ENCOUNTER — Other Ambulatory Visit: Payer: Self-pay | Admitting: Hematology and Oncology

## 2022-10-30 DIAGNOSIS — N390 Urinary tract infection, site not specified: Secondary | ICD-10-CM

## 2022-10-30 MED ORDER — AMOXICILLIN-POT CLAVULANATE 875-125 MG PO TABS: 1.0000 | ORAL_TABLET | Freq: Two times a day (BID) | ORAL | 0 refills | Status: DC

## 2022-10-30 NOTE — Telephone Encounter (Signed)
-----   Message from Heath Lark, MD sent at 10/30/2022  8:06 AM EST ----- I sent prescription to her pharmacy; Urine culture confirmed UTI

## 2022-10-30 NOTE — Telephone Encounter (Signed)
Called and given below message. She verbalized understanding and appreciated the call.

## 2022-11-07 ENCOUNTER — Ambulatory Visit (HOSPITAL_COMMUNITY)
Admission: RE | Admit: 2022-11-07 | Discharge: 2022-11-07 | Disposition: A | Discharge status: Home / Self Care | Payer: Medicare PPO | Source: Ambulatory Visit | Attending: Hematology and Oncology | Admitting: Hematology and Oncology

## 2022-11-07 ENCOUNTER — Encounter (HOSPITAL_COMMUNITY): Payer: Self-pay

## 2022-11-07 DIAGNOSIS — C538 Malignant neoplasm of overlapping sites of cervix uteri: Secondary | ICD-10-CM

## 2022-11-07 MED ORDER — SODIUM CHLORIDE (PF) 0.9 % IJ SOLN
INTRAMUSCULAR | Status: AC
  Filled 2022-11-07: qty 50

## 2022-11-07 MED ORDER — HEPARIN SOD (PORK) LOCK FLUSH 100 UNIT/ML IV SOLN: 500.0000 [IU] | Freq: Once | INTRAVENOUS | Status: DC

## 2022-11-07 MED ORDER — HEPARIN SOD (PORK) LOCK FLUSH 100 UNIT/ML IV SOLN
INTRAVENOUS | Status: AC
  Administered 2022-11-07: 500 [IU]
  Filled 2022-11-07: qty 5

## 2022-11-07 MED ORDER — IOHEXOL 300 MG/ML  SOLN
75.0000 mL | Freq: Once | INTRAMUSCULAR | Status: AC | PRN
  Administered 2022-11-07: 75 mL via INTRAVENOUS

## 2022-11-08 MED FILL — Fosaprepitant Dimeglumine For IV Infusion 150 MG (Base Eq): INTRAVENOUS | Qty: 5 | Status: AC

## 2022-11-08 MED FILL — Dexamethasone Sodium Phosphate Inj 100 MG/10ML: INTRAMUSCULAR | Qty: 1 | Status: AC

## 2022-11-09 ENCOUNTER — Encounter: Payer: Self-pay | Admitting: Hematology and Oncology

## 2022-11-09 ENCOUNTER — Inpatient Hospital Stay: Payer: Medicare PPO

## 2022-11-09 ENCOUNTER — Inpatient Hospital Stay (HOSPITAL_BASED_OUTPATIENT_CLINIC_OR_DEPARTMENT_OTHER): Payer: Medicare PPO | Admitting: Hematology and Oncology

## 2022-11-09 ENCOUNTER — Other Ambulatory Visit: Payer: Self-pay | Admitting: Hematology and Oncology

## 2022-11-09 VITALS — BP 157/92 | HR 132 | Temp 97.4°F | Resp 18 | Ht 62.0 in | Wt 213.8 lb

## 2022-11-09 DIAGNOSIS — C539 Malignant neoplasm of cervix uteri, unspecified: Secondary | ICD-10-CM

## 2022-11-09 DIAGNOSIS — C538 Malignant neoplasm of overlapping sites of cervix uteri: Secondary | ICD-10-CM | POA: Diagnosis not present

## 2022-11-09 DIAGNOSIS — D61818 Other pancytopenia: Secondary | ICD-10-CM

## 2022-11-09 DIAGNOSIS — E119 Type 2 diabetes mellitus without complications: Secondary | ICD-10-CM

## 2022-11-09 DIAGNOSIS — I10 Essential (primary) hypertension: Secondary | ICD-10-CM | POA: Diagnosis not present

## 2022-11-09 DIAGNOSIS — Z5112 Encounter for antineoplastic immunotherapy: Secondary | ICD-10-CM | POA: Diagnosis not present

## 2022-11-09 LAB — CBC WITH DIFFERENTIAL (CANCER CENTER ONLY)
Abs Immature Granulocytes: 0.02 10*3/uL (ref 0.00–0.07)
Basophils Absolute: 0 10*3/uL (ref 0.0–0.1)
Basophils Relative: 0 %
Eosinophils Absolute: 0.1 10*3/uL (ref 0.0–0.5)
Eosinophils Relative: 2 %
HCT: 28.1 % — ABNORMAL LOW (ref 36.0–46.0)
Hemoglobin: 9.8 g/dL — ABNORMAL LOW (ref 12.0–15.0)
Immature Granulocytes: 1 %
Lymphocytes Relative: 10 %
Lymphs Abs: 0.3 10*3/uL — ABNORMAL LOW (ref 0.7–4.0)
MCH: 32.8 pg (ref 26.0–34.0)
MCHC: 34.9 g/dL (ref 30.0–36.0)
MCV: 94 fL (ref 80.0–100.0)
Monocytes Absolute: 0 10*3/uL — ABNORMAL LOW (ref 0.1–1.0)
Monocytes Relative: 1 %
Neutro Abs: 3.2 10*3/uL (ref 1.7–7.7)
Neutrophils Relative %: 86 %
Platelet Count: 99 10*3/uL — ABNORMAL LOW (ref 150–400)
RBC: 2.99 MIL/uL — ABNORMAL LOW (ref 3.87–5.11)
RDW: 19.3 % — ABNORMAL HIGH (ref 11.5–15.5)
WBC Count: 3.6 10*3/uL — ABNORMAL LOW (ref 4.0–10.5)
nRBC: 0 % (ref 0.0–0.2)

## 2022-11-09 LAB — CMP (CANCER CENTER ONLY)
ALT: 13 U/L (ref 0–44)
AST: 15 U/L (ref 15–41)
Albumin: 4 g/dL (ref 3.5–5.0)
Alkaline Phosphatase: 61 U/L (ref 38–126)
Anion gap: 10 (ref 5–15)
BUN: 17 mg/dL (ref 8–23)
CO2: 26 mmol/L (ref 22–32)
Calcium: 9.2 mg/dL (ref 8.9–10.3)
Chloride: 102 mmol/L (ref 98–111)
Creatinine: 0.9 mg/dL (ref 0.44–1.00)
GFR, Estimated: >60 mL/min (ref >60)
Glucose, Bld: 276 mg/dL — ABNORMAL HIGH (ref 70–99)
Potassium: 4.3 mmol/L (ref 3.5–5.1)
Sodium: 138 mmol/L (ref 135–145)
Total Bilirubin: 0.8 mg/dL (ref 0.3–1.2)
Total Protein: 6.6 g/dL (ref 6.5–8.1)

## 2022-11-09 LAB — TSH: TSH: 1.343 u[IU]/mL (ref 0.350–4.500)

## 2022-11-09 MED ORDER — ATENOLOL 25 MG PO TABS: 25.0000 mg | ORAL_TABLET | Freq: Every day | ORAL | 1 refills | Status: DC

## 2022-11-09 MED ORDER — SODIUM CHLORIDE 0.9 % IV SOLN
150.0000 mg | Freq: Once | INTRAVENOUS | Status: AC
  Administered 2022-11-09: 150 mg via INTRAVENOUS
  Filled 2022-11-09: qty 150

## 2022-11-09 MED ORDER — FAMOTIDINE IN NACL 20-0.9 MG/50ML-% IV SOLN
20.0000 mg | Freq: Once | INTRAVENOUS | Status: AC
  Administered 2022-11-09: 20 mg via INTRAVENOUS
  Filled 2022-11-09: qty 50

## 2022-11-09 MED ORDER — SODIUM CHLORIDE 0.9 % IV SOLN
140.0000 mg/m2 | Freq: Once | INTRAVENOUS | Status: AC
  Administered 2022-11-09: 288 mg via INTRAVENOUS
  Filled 2022-11-09: qty 48

## 2022-11-09 MED ORDER — LISINOPRIL 10 MG PO TABS: 10.0000 mg | ORAL_TABLET | Freq: Every day | ORAL | 1 refills | Status: DC

## 2022-11-09 MED ORDER — SODIUM CHLORIDE 0.9 % IV SOLN
15.0000 mg/kg | Freq: Once | INTRAVENOUS | Status: AC
  Administered 2022-11-09: 1500 mg via INTRAVENOUS
  Filled 2022-11-09: qty 12

## 2022-11-09 MED ORDER — SODIUM CHLORIDE 0.9 % IV SOLN
200.0000 mg | Freq: Once | INTRAVENOUS | Status: AC
  Administered 2022-11-09: 200 mg via INTRAVENOUS
  Filled 2022-11-09: qty 8

## 2022-11-09 MED ORDER — SODIUM CHLORIDE 0.9 % IV SOLN
10.0000 mg | Freq: Once | INTRAVENOUS | Status: AC
  Administered 2022-11-09: 10 mg via INTRAVENOUS
  Filled 2022-11-09: qty 10

## 2022-11-09 MED ORDER — PALONOSETRON HCL INJECTION 0.25 MG/5ML
0.2500 mg | Freq: Once | INTRAVENOUS | Status: AC
  Administered 2022-11-09: 0.25 mg via INTRAVENOUS
  Filled 2022-11-09: qty 5

## 2022-11-09 MED ORDER — SODIUM CHLORIDE 0.9 % IV SOLN: Freq: Once | INTRAVENOUS | Status: AC

## 2022-11-09 MED ORDER — CETIRIZINE HCL 10 MG/ML IV SOLN
10.0000 mg | Freq: Once | INTRAVENOUS | Status: AC
  Administered 2022-11-09: 10 mg via INTRAVENOUS
  Filled 2022-11-09: qty 1

## 2022-11-09 MED ORDER — SODIUM CHLORIDE 0.9 % IV SOLN
490.5000 mg | Freq: Once | INTRAVENOUS | Status: AC
  Administered 2022-11-09: 500 mg via INTRAVENOUS
  Filled 2022-11-09: qty 50

## 2022-11-09 MED ORDER — HEPARIN SOD (PORK) LOCK FLUSH 100 UNIT/ML IV SOLN
500.0000 [IU] | Freq: Once | INTRAVENOUS | Status: AC | PRN
  Administered 2022-11-09: 500 [IU]

## 2022-11-09 MED ORDER — SODIUM CHLORIDE 0.9% FLUSH
10.0000 mL | INTRAVENOUS | Status: DC | PRN
  Administered 2022-11-09: 10 mL

## 2022-11-09 MED ORDER — SODIUM CHLORIDE 0.9% FLUSH
10.0000 mL | Freq: Once | INTRAVENOUS | Status: AC
  Administered 2022-11-09: 10 mL

## 2022-11-09 NOTE — Assessment & Plan Note (Signed)
She has multifactorial pancytopenia, likely due to chemotherapy and history of B12 deficiency Will proceed with treatment without delay She does not need transfusion support As above, I will delay the next treatment until after spring break to allow bone marrow recovery

## 2022-11-09 NOTE — Progress Notes (Signed)
Per Dr Alvy Bimler, no need for a urine protein today.

## 2022-11-09 NOTE — Assessment & Plan Note (Signed)
Overall, she tolerated treatment well with expected side effects including pancytopenia and hypertension I reviewed CT imaging with the patient which show positive response to therapy Will proceed with a few more cycles of treatment before repeating another CT imaging I plan slight dose reduction today and delay the next treatment until after spring break

## 2022-11-09 NOTE — Progress Notes (Signed)
[facility] OFFICE PROGRESS NOTE  Patient Care Team: Alvester Chou, NP as PCP - General (Nurse Practitioner)  ASSESSMENT & PLAN:  Cervical cancer ([place]) Overall, she tolerated treatment well with expected side effects including pancytopenia and hypertension I reviewed CT imaging with the patient which show positive response to therapy Will proceed with a few more cycles of treatment before repeating another CT imaging I plan slight dose reduction today and delay the next treatment until after spring break  Pancytopenia, acquired ([place]) She has multifactorial pancytopenia, likely due to chemotherapy and history of B12 deficiency Will proceed with treatment without delay She does not need transfusion support As above, I will delay the next treatment until after spring break to allow bone marrow recovery  Essential hypertension Her blood pressure started to trend hide could be due to bevacizumab I plan to increase the dose of her lisinopril and add atenolol  Diabetes mellitus without complication ([place]) We discussed importance of dietary modification while on treatment  No orders of the defined types were placed in this encounter.   All questions were answered. The patient knows to call the clinic with any problems, questions or concerns. The total time spent in the appointment was 40 minutes encounter with patients including review of chart and various tests results, discussions about plan of care and coordination of care plan   Heath Lark, MD 11/09/2022 10:01 AM  INTERVAL HISTORY: Please see below for problem oriented charting. she returns for treatment follow-up We reviewed CT imaging results which show positive response to therapy She tolerated treatment well except for pancytopenia of which she is not symptomatic Denies peripheral neuropathy She has noted borderline elevated blood pressure She is very anxious She desire to spend some quality time with her  grandchildren during spring break REVIEW OF SYSTEMS:   Constitutional: Denies fevers, chills or abnormal weight loss Eyes: Denies blurriness of vision Ears, nose, mouth, throat, and face: Denies mucositis or sore throat Respiratory: Denies cough, dyspnea or wheezes Cardiovascular: Denies palpitation, chest discomfort or lower extremity swelling Gastrointestinal:  Denies nausea, heartburn or change in bowel habits Skin: Denies abnormal skin rashes Lymphatics: Denies new lymphadenopathy or easy bruising Neurological:Denies numbness, tingling or new weaknesses Behavioral/Psych: Mood is stable, no new changes  All other systems were reviewed with the patient and are negative.  I have reviewed the past medical history, past surgical history, social history and family history with the patient and they are unchanged from previous note.  ALLERGIES:  has No Known Allergies.  MEDICATIONS:  Current Outpatient Medications  Medication Sig Dispense Refill   atenolol (TENORMIN) 25 MG tablet Take 1 tablet (25 mg total) by mouth daily. Take in the morning 30 tablet 1   calcium carbonate (TUMS - DOSED IN MG ELEMENTAL CALCIUM) 500 MG chewable tablet Chew 1 tablet by mouth daily as needed for indigestion or heartburn.     dexamethasone (DECADRON) 4 MG tablet Take 2 tabs at the night before chemotherapy, every 3 weeks, by mouth x 6 cycles 36 tablet 6   ezetimibe (ZETIA) 10 MG tablet Take 10 mg by mouth in the morning.     glimepiride (AMARYL) 1 MG tablet Take 1 mg by mouth 2 (two) times daily.     lidocaine-prilocaine (EMLA) cream Apply to affected area once 30 g 3   lisinopril (ZESTRIL) 10 MG tablet Take 1 tablet (10 mg total) by mouth daily. Take in the evening 30 tablet 1   magnesium oxide (MAG-OX) 400 (241.3 Mg)  MG tablet Take 1 tablet (400 mg total) by mouth 2 (two) times daily. 60 tablet 11   ondansetron (ZOFRAN) 8 MG tablet Take 1 tablet (8 mg total) by mouth every 8 (eight) hours as needed for nausea  or vomiting. Start on the third day after chemotherapy. 30 tablet 1   prochlorperazine (COMPAZINE) 10 MG tablet Take 1 tablet (10 mg total) by mouth every 6 (six) hours as needed for nausea or vomiting. 30 tablet 1   rosuvastatin (CRESTOR) 40 MG tablet Take 40 mg by mouth at bedtime.     sitaGLIPtin (JANUVIA) 100 MG tablet Take 50 mg by mouth in the morning.     traMADol (ULTRAM) 50 MG tablet Take 1 tablet (50 mg total) by mouth every 6 (six) hours as needed. 30 tablet 0   vitamin B-12 (CYANOCOBALAMIN) 1000 MCG tablet Take 1,000 mcg by mouth in the morning.     No current facility-administered medications for this visit.   Facility-Administered Medications Ordered in Other Visits  Medication Dose Route Frequency Provider Last Rate Last Admin   bevacizumab-awwb (MVASI) 1,500 mg in sodium chloride 0.9 % 100 mL chemo infusion  15 mg/kg (Treatment Plan Recorded) Intravenous Once Alvy Bimler, Merna Baldi, MD       CARBOplatin (PARAPLATIN) 500 mg in sodium chloride 0.9 % 250 mL chemo infusion  500 mg Intravenous Once Alvy Bimler, Evalene Vath, MD       heparin lock flush 100 unit/mL  500 Units Intracatheter Once PRN Alvy Bimler, Lydia Meng, MD       PACLitaxel (TAXOL) 288 mg in sodium chloride 0.9 % 250 mL chemo infusion (> '80mg'$ /m2)  140 mg/m2 (Treatment Plan Recorded) Intravenous Once Alvy Bimler, Deloy Archey, MD       pembrolizumab (KEYTRUDA) 200 mg in sodium chloride 0.9 % 50 mL chemo infusion  200 mg Intravenous Once Heath Lark, MD 116 mL/hr at 11/09/22 0958 200 mg at 11/09/22 0958   sodium chloride flush (NS) 0.9 % injection 10 mL  10 mL Intracatheter PRN Heath Lark, MD        SUMMARY OF ONCOLOGIC HISTORY: Oncology History Overview Note  Adenocarcinoma, 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   08/27/2020 Imaging   MRI pelvis Large cervical mass with  pelvic adenopathy, at least T2B N1 disease. Question of involvement of posterior urinary bladder (T4) for which additional images have been requested. Patient will return for additional images to answer this question. (Thinner section axial T2 without fat sat and sagittal T2 weighted imaging also without fat saturation.)   Small field-of-view images show that the parametrial extension comes in very close proximity to the RIGHT ureter.   08/30/2020 Initial Diagnosis   Cervical cancer ([place])   08/30/2020 Cancer Staging   Staging form: Cervix Uteri, AJCC Version 9 - Clinical stage from 08/30/2020: FIGO Stage IVB (rcT2, cN1, pM1) - Signed by Heath Lark, MD on 09/01/2022 Stage prefix: Recurrence   09/01/2020 PET scan   1. Large cervical mass extending into the endometrial canal to the level of the uterine fundus, associated with pelvic adenopathy to the level of the RIGHT common iliac chain. Please refer to MRI for further detail. 2. No signs of solid organ uptake or metastatic disease to the chest. 3. Subcutaneous nodule with marked increased metabolic activity, correlation with direct clinical inspection is suggested to exclude cutaneous neoplasm or subcutaneous nodule  in this location. Complicated sebaceous cyst could also potentially have this appearance. 4. Uptake in the anal canal could likely physiologic. Given the intense nature of the uptake would suggest correlation with direct clinical inspection/examination. 5. Uptake in axillary lymph nodes with activity less than mediastinal blood pool likely small reactive lymph nodes, attention on follow-up. Morphologic features also with benign appearance.   09/16/2020 Procedure   Successful placement of a power injectable Port-A-Cath via the right internal jugular vein. The catheter is ready for immediate use.     09/20/2020 - 10/25/2020 Chemotherapy   She received cisplatin chemo with concurrent radiation       09/22/2020 - 12/08/2020 Radiation  Therapy   Radiation Treatment Dates: 09/22/2020 through 12/08/2020   Site: Pelvis Technique: IMRT Total Dose (Gy): 45/45 Dose per Fx (Gy): 1.8 Completed Fx: 25/25 Beam Energies: 6x   Site: pelvic boost Technique: IMRT Total Dose (Gy): 7.2/7.2 Dose per Fx (Gy): 1.8 Completed Fx: 4/4 Beam Energies: 6x   Site: Cervix boost Technique: HDR-brachytherapy Total Dose (Gy): 27.5/27.5 Dose per Fx (Gy): 5.5 Completed Fx: 5/5 Beam Energies: Ir-192     03/17/2021 PET scan   1. Mark positive response to therapy. 2. Resolution of metabolic activity within the uterine body. 3. Resolution of size and metabolic activity of pelvic lymph nodes. 4. No evidence of new disease outside of the pelvis.   08/10/2022 PET scan   1. New hypermetabolic bilateral pulmonary nodules and hypermetabolic cervical and hilar lymph nodes, consistent with metastatic disease. 2. Hypermetabolic focus in the anterior right third costochondral junction without discrete CT correlate is also compatible with metastatic disease. 3. Prior hysterectomy without suspicious hypermetabolic nodularity along the vaginal cuff. 4.  Aortic Atherosclerosis (ICD10-I70.0).   08/24/2022 Pathology Results   A. SOFT TISSUE NODULE, RIGHT CHEST WALL, NEEDLE CORE BIOPSY:  Poorly differentiated carcinoma.  See comment.   COMMENT:  The carcinoma is characterized by nest of malignant epithelioid cells which focally has features suggestive of squamous differentiation morphologically.  Immunohistochemistry is positive with cytokeratin 7, PAX8, MOC-31 and estrogen receptor.  There is patchy positivity with p16, progesterone receptor and TTF-1.  The tumor is negative with p40, cytokeratin 5/6, p63, CDX2, cytokeratin 20 and WT-1.  The immunophenotype is consistent with adenocarcinoma and suggestive of a gynecologic primary.    09/08/2022 -  Chemotherapy   Patient is on Treatment Plan : OVARIAN Carboplatin + Paclitaxel + Bevacizumab q21d       11/07/2022 Imaging   1. Decreased size of the pulmonary nodules and mediastinal adenopathy, consistent with treatment response. 2. Similar appearance of the previously hypermetabolic soft tissue subjacent to the right anterior third costochondral cartilage. 3. No convincing evidence of new or progressive disease in the chest, abdomen or pelvis. 4. Rectal wall thickening with stranding in the mesorectal fat and diffuse thickening of the urinary bladder wall, nonspecific but possibly reflecting post radiation change. 5. Nonobstructive right renal stones measure up to 6 mm in the renal pelvis. 6. Colonic diverticulosis without findings of acute diverticulitis. 7.  Aortic Atherosclerosis (ICD10-I70.0).   Malignant neoplasm of cervix ([place])  09/01/2020 Initial Diagnosis   Malignant neoplasm of cervix ([place])   09/20/2020 - 10/25/2020 Chemotherapy   She received cisplatin chemo with concurrent radiation       09/22/2020 - 12/08/2020 Radiation Therapy   Radiation Treatment Dates: 09/22/2020 through 12/08/2020   Site: Pelvis Technique: IMRT Total Dose (Gy): 45/45 Dose per Fx (Gy): 1.8 Completed Fx: 25/25 Beam Energies: 6x   Site: pelvic  boost Technique: IMRT Total Dose (Gy): 7.2/7.2 Dose per Fx (Gy): 1.8 Completed Fx: 4/4 Beam Energies: 6x   Site: Cervix boost Technique: HDR-brachytherapy Total Dose (Gy): 27.5/27.5 Dose per Fx (Gy): 5.5 Completed Fx: 5/5 Beam Energies: Ir-192   09/08/2022 -  Chemotherapy   Patient is on Treatment Plan : OVARIAN Carboplatin + Paclitaxel + Bevacizumab q21d        PHYSICAL EXAMINATION: ECOG PERFORMANCE STATUS: 1 - Symptomatic but completely ambulatory  Vitals:   11/09/22 0804  BP: (!) 157/92  Pulse: (!) 132  Resp: 18  Temp: (!) 97.4 F (36.3 C)  SpO2: 100%   Filed Weights   11/09/22 0804  Weight: 213 lb 12.8 oz (97 kg)    GENERAL:alert, no distress and comfortable  NEURO: alert & oriented x 3 with fluent speech, no focal motor/sensory  deficits  LABORATORY DATA:  I have reviewed the data as listed    Component Value Date/Time   NA 138 11/09/2022 0741   K 4.3 11/09/2022 0741   CL 102 11/09/2022 0741   CO2 26 11/09/2022 0741   GLUCOSE 276 (H) 11/09/2022 0741   BUN 17 11/09/2022 0741   CREATININE 0.90 11/09/2022 0741   CALCIUM 9.2 11/09/2022 0741   PROT 6.6 11/09/2022 0741   ALBUMIN 4.0 11/09/2022 0741   AST 15 11/09/2022 0741   ALT 13 11/09/2022 0741   ALKPHOS 61 11/09/2022 0741   BILITOT 0.8 11/09/2022 0741   GFRNONAA >60 11/09/2022 0741    No results found for: "SPEP", "UPEP"  Lab Results  Component Value Date   WBC 3.6 (L) 11/09/2022   NEUTROABS 3.2 11/09/2022   HGB 9.8 (L) 11/09/2022   HCT 28.1 (L) 11/09/2022   MCV 94.0 11/09/2022   PLT 99 (L) 11/09/2022      Chemistry      Component Value Date/Time   NA 138 11/09/2022 0741   K 4.3 11/09/2022 0741   CL 102 11/09/2022 0741   CO2 26 11/09/2022 0741   BUN 17 11/09/2022 0741   CREATININE 0.90 11/09/2022 0741      Component Value Date/Time   CALCIUM 9.2 11/09/2022 0741   ALKPHOS 61 11/09/2022 0741   AST 15 11/09/2022 0741   ALT 13 11/09/2022 0741   BILITOT 0.8 11/09/2022 0741       RADIOGRAPHIC STUDIES: I have reviewed multiple imaging studies with the patient I have personally reviewed the radiological images as listed and agreed with the findings in the report. CT CHEST ABDOMEN PELVIS W CONTRAST  Result Date: 11/07/2022 CLINICAL DATA:  History of cervical cancer, monitor/restaging. Ongoing chemotherapy. * Tracking Code: BO * EXAM: CT CHEST, ABDOMEN, AND PELVIS WITH CONTRAST TECHNIQUE: Multidetector CT imaging of the chest, abdomen and pelvis was performed following the standard protocol during bolus administration of intravenous contrast. RADIATION DOSE REDUCTION: This exam was performed according to the departmental dose-optimization program which includes automated exposure control, adjustment of the mA and/or kV according to patient size  and/or use of iterative reconstruction technique. CONTRAST:  13m OMNIPAQUE IOHEXOL 300 MG/ML  SOLN COMPARISON:  Multiple priors including PET-CT August 10, 2022. FINDINGS: CT CHEST FINDINGS Cardiovascular: Accessed right chest Port-A-Cath with tip at the superior cavoatrial junction. Normal caliber thoracic aorta. No central pulmonary embolus on this nondedicated study. Normal size heart. Lipomatous hypertrophy of the intra-atrial septum. No significant pericardial effusion/thickening. Mediastinum/Nodes: Decreased size of the mediastinal lymph nodes which were hypermetabolic on prior examination. Previously indexed precarinal lymph node now measures 8 mm in short axis  on image 19/2 previously 12 mm. No pathologically enlarged hilar lymph nodes. Calcified left hilar lymph nodes. No pathologically enlarged axillary lymph nodes. Small hiatal hernia. Lungs/Pleura: Decreased size of the pulmonary nodules which were hypermetabolic on prior examination. No new suspicious pulmonary nodules or masses. Previously indexed nodules are as follows: -thin linear band of tissue corresponding with the left upper lobe pulmonary nodule on image 30/4 previously measuring 8 mm. -right upper lobe pulmonary nodule now measures 4 mm on image 19/4 previously 8 mm. Stable scattered atelectasis/scarring. Biapical pleuroparenchymal scarring. Calcified granulomata. No pleural effusion. No pneumothorax. Musculoskeletal: No aggressive lytic or blastic lesion of bone. Tiny chest wall nodule subjacent to the right anterior third costochondral cartilage appears similar prior on image 21/2. Multilevel degenerative changes spine with bridging anterior vertebral osteophytes. CT ABDOMEN PELVIS FINDINGS Hepatobiliary: No suspicious hepatic lesion. Gallbladder is unremarkable. No biliary ductal dilation. Pancreas: No pancreatic ductal dilation or evidence of acute inflammation. Spleen: No splenomegaly.  Calcified splenic granulomata. Adrenals/Urinary  Tract: Bilateral adrenal glands appear normal. No hydronephrosis. Nonobstructive right renal stones measure up to 6 mm in the renal pelvis. Kidneys demonstrate symmetric enhancement and excretion of contrast material. Symmetric wall thickening of a nondistended urinary bladder. Stomach/Bowel: No radiopaque enteric contrast material was administered. Tiny hiatal hernia. Stomach is minimally distended limiting evaluation. No pathologic dilation of small or large bowel. Colonic diverticulosis without findings of acute diverticulitis. Rectal wall thickening with stranding in the mesorectal fat. Vascular/Lymphatic: Aortic atherosclerosis. Normal caliber abdominal aorta. Smooth IVC contours. The portal, splenic and superior mesenteric veins are patent. No pathologically enlarged abdominal or pelvic lymph nodes. Reproductive: Uterus is unremarkable in CT appearance. No suspicious adnexal mass. Other: Fat stranding in the pelvis with mild peritoneal thickening is minimally progressed from prior. No discrete peritoneal or omental nodularity. Musculoskeletal: Probable postradiation change in the sacrum. No aggressive lytic or blastic lesion of bone. Multilevel degenerative changes spine. IMPRESSION: 1. Decreased size of the pulmonary nodules and mediastinal adenopathy, consistent with treatment response. 2. Similar appearance of the previously hypermetabolic soft tissue subjacent to the right anterior third costochondral cartilage. 3. No convincing evidence of new or progressive disease in the chest, abdomen or pelvis. 4. Rectal wall thickening with stranding in the mesorectal fat and diffuse thickening of the urinary bladder wall, nonspecific but possibly reflecting post radiation change. 5. Nonobstructive right renal stones measure up to 6 mm in the renal pelvis. 6. Colonic diverticulosis without findings of acute diverticulitis. 7.  Aortic Atherosclerosis (ICD10-I70.0). Electronically Signed   By: Dahlia Bailiff M.D.   On:  11/07/2022 11:38

## 2022-11-09 NOTE — Patient Instructions (Signed)
[facility]  Discharge Instructions: Thank you for choosing [facility] to provide your oncology and hematology care.   If you have a lab appointment with the [facility], please go directly to the [facility] and check in at the registration area.   Wear comfortable clothing and clothing appropriate for easy access to any Portacath or PICC line.   We strive to give you quality time with your provider. You may need to reschedule your appointment if you arrive late (15 or more minutes).  Arriving late affects you and other patients whose appointments are after yours.  Also, if you miss three or more appointments without notifying the office, you may be dismissed from the clinic at the provider's discretion.      For prescription refill requests, have your pharmacy contact our office and allow 72 hours for refills to be completed.    Today you received the following chemotherapy and/or immunotherapy agents: pembrolizumab, bevacizumab, paclitaxel and carboplatin      To help prevent nausea and vomiting after your treatment, we encourage you to take your nausea medication as directed.  BELOW ARE SYMPTOMS THAT SHOULD BE REPORTED IMMEDIATELY: *FEVER GREATER THAN 100.4 F (38 C) OR HIGHER *CHILLS OR SWEATING *NAUSEA AND VOMITING THAT IS NOT CONTROLLED WITH YOUR NAUSEA MEDICATION *UNUSUAL SHORTNESS OF BREATH *UNUSUAL BRUISING OR BLEEDING *URINARY PROBLEMS (pain or burning when urinating, or frequent urination) *BOWEL PROBLEMS (unusual diarrhea, constipation, pain near the anus) TENDERNESS IN MOUTH AND THROAT WITH OR WITHOUT PRESENCE OF ULCERS (sore throat, sores in mouth, or a toothache) UNUSUAL RASH, SWELLING OR PAIN  UNUSUAL VAGINAL DISCHARGE OR ITCHING   Items with * indicate a potential emergency and should be followed up as soon as possible or go to the Emergency Department if any problems should occur.  Please show the CHEMOTHERAPY  ALERT CARD or IMMUNOTHERAPY ALERT CARD at check-in to the Emergency Department and triage nurse.  Should you have questions after your visit or need to cancel or reschedule your appointment, please contact [facility]  Dept: [phone]  and follow the prompts.  Office hours are 8:00 a.m. to 4:30 p.m. Monday - Friday. Please note that voicemails left after 4:00 p.m. may not be returned until the following business day.  We are closed weekends and major holidays. You have access to a nurse at all times for urgent questions. Please call the main number to the clinic Dept: [phone] and follow the prompts.   For any non-urgent questions, you may also contact your provider using MyChart. We now offer e-Visits for anyone 60 and older to request care online for non-urgent symptoms. For details visit [email or web address].   Also download the MyChart app! Go to the app store, search "MyChart", open the app, select [facility], and log in with your MyChart username and password.

## 2022-11-09 NOTE — Assessment & Plan Note (Signed)
We discussed importance of dietary modification while on treatment

## 2022-11-09 NOTE — Assessment & Plan Note (Signed)
Her blood pressure started to trend hide could be due to bevacizumab I plan to increase the dose of her lisinopril and add atenolol

## 2022-11-10 LAB — T4: T4, Total: 8.9 ug/dL (ref 4.5–12.0)

## 2022-11-13 ENCOUNTER — Other Ambulatory Visit: Payer: Self-pay | Admitting: Hematology and Oncology

## 2022-11-13 ENCOUNTER — Telehealth: Payer: Self-pay | Admitting: Oncology

## 2022-11-13 MED ORDER — SULFAMETHOXAZOLE-TRIMETHOPRIM 800-160 MG PO TABS: 1.0000 | ORAL_TABLET | Freq: Two times a day (BID) | ORAL | 0 refills | Status: DC

## 2022-11-13 NOTE — Telephone Encounter (Signed)
I will send refill just this one time without checking her urine BUT if she has UTI again she needs urine culture in the future Also, she has uncontrolled diabetes based on her recent blood work; she will be at risk of recurrent UTI in the future unless her diabetes is better controlled. For that she needs to follow with PCP

## 2022-11-13 NOTE — Telephone Encounter (Signed)
Misty Deleon called and said she thinks she her UTI is back again.  She starting having burning with urination Saturday night. She started drinking pure cranberry juice and water and now the burning has gone away.  She is wondering if Dr. Alvy Bimler can send her in medication for a UTI.    Asked if she can come in today for a urinalysis/culture and she said today, tomorrow and Wednesday are the days she has pain after chemo.  She would rather not come in and would like something sent in if possible.

## 2022-11-13 NOTE — Telephone Encounter (Signed)
Called Misty Deleon and advised her of message below from Dr. Alvy Bimler.  She verbalized agreement and is going to contact her PCP, Alvester Chou, NP for an appointment to discuss managing her diabetes.

## 2022-11-28 ENCOUNTER — Encounter: Payer: Self-pay | Admitting: Hematology and Oncology

## 2022-11-30 IMAGING — PT NM PET TUM IMG INITIAL (PI) SKULL BASE T - THIGH
1 series · 7 of 7 positions shown · non-contrast
Comparison: MR pelvis from 08/26/2020

CLINICAL DATA: Initial treatment strategy for uterine/cervical
cancer staging in this 69-year-old female.

EXAM:
NUCLEAR MEDICINE PET SKULL BASE TO THIGH
TECHNIQUE: 10.7 mCi F-18 FDG was injected intravenously. Full-ring PET imaging
was performed from the skull base to thigh after the radiotracer. CT
data was obtained and used for attenuation correction and anatomic
localization.
Fasting blood glucose: 190 mg/dl

[Series 1058: results mm oncology reading · 1.0mm · 0.50mm/px · 7 of 7 slices shown]
[im 1/7]
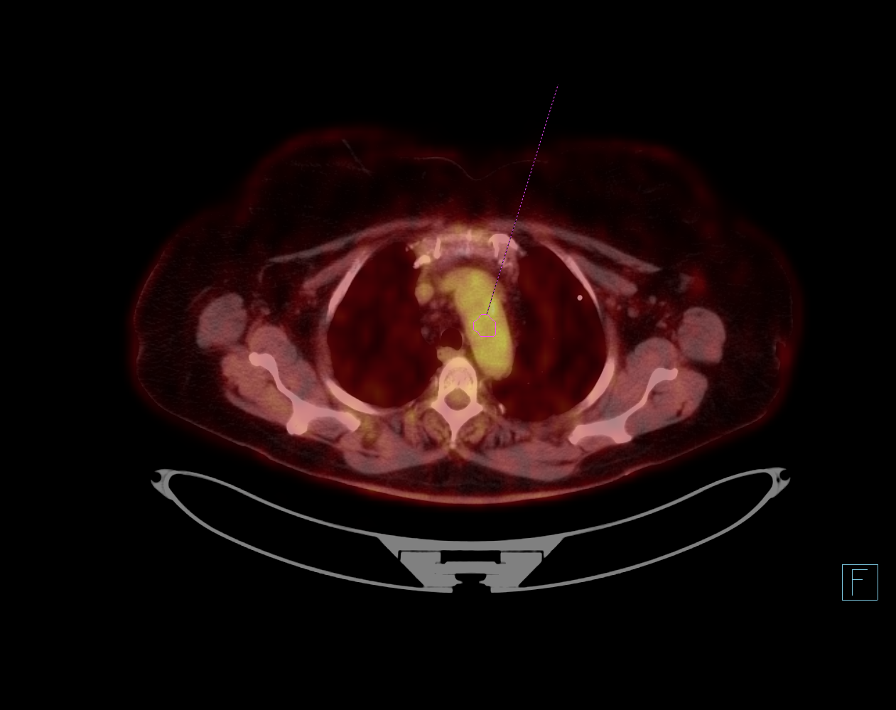
[im 2/7]
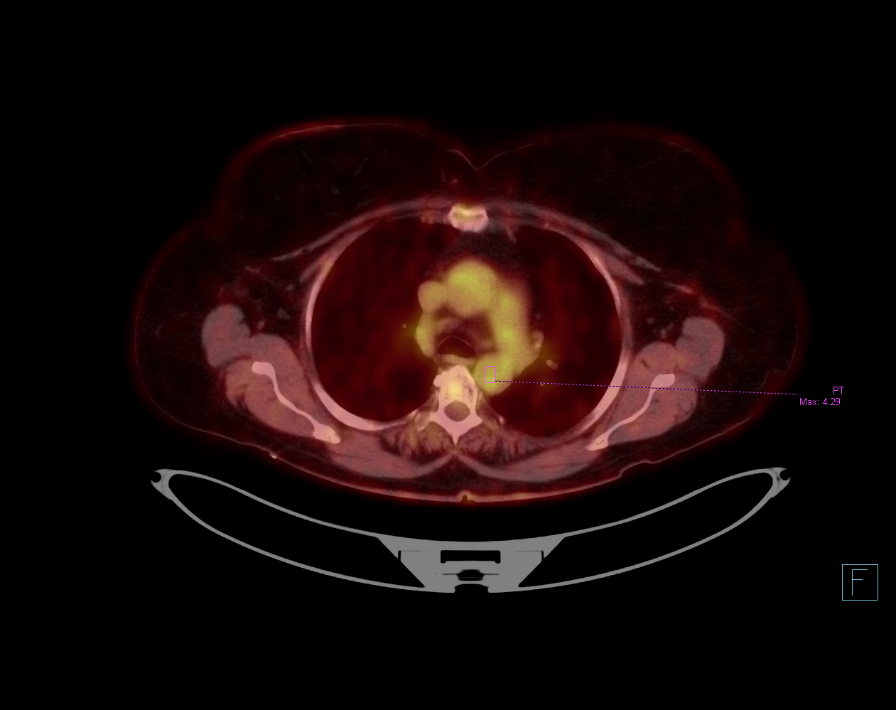
[im 3/7]
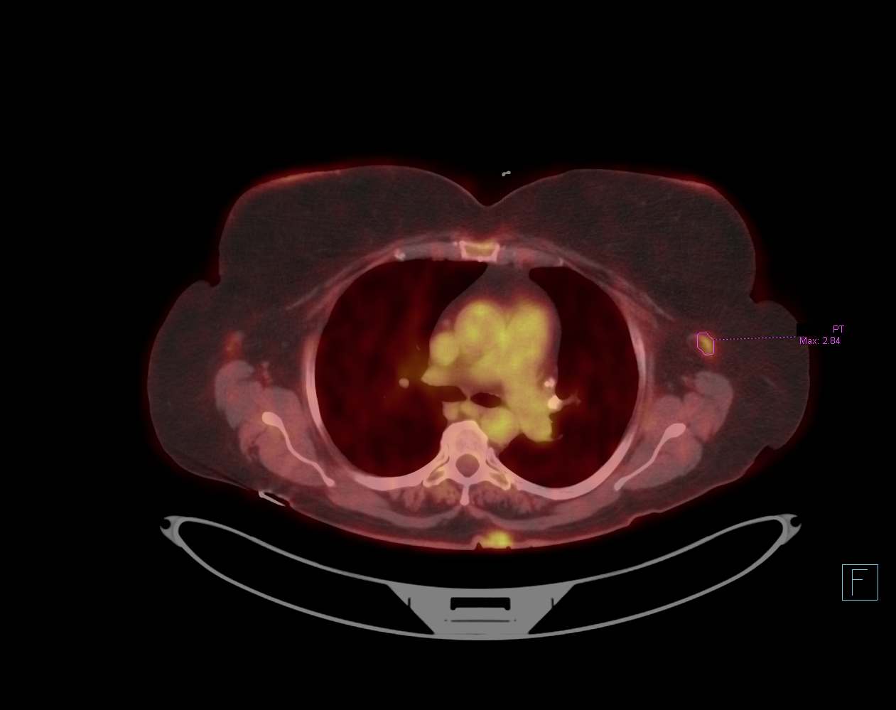
[im 4/7]
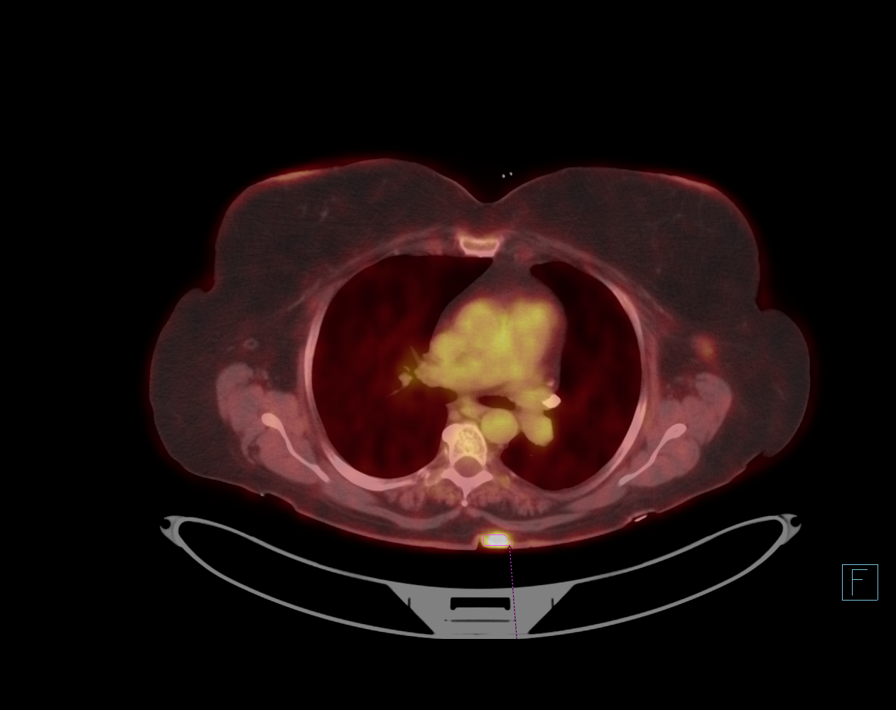
[im 5/7]
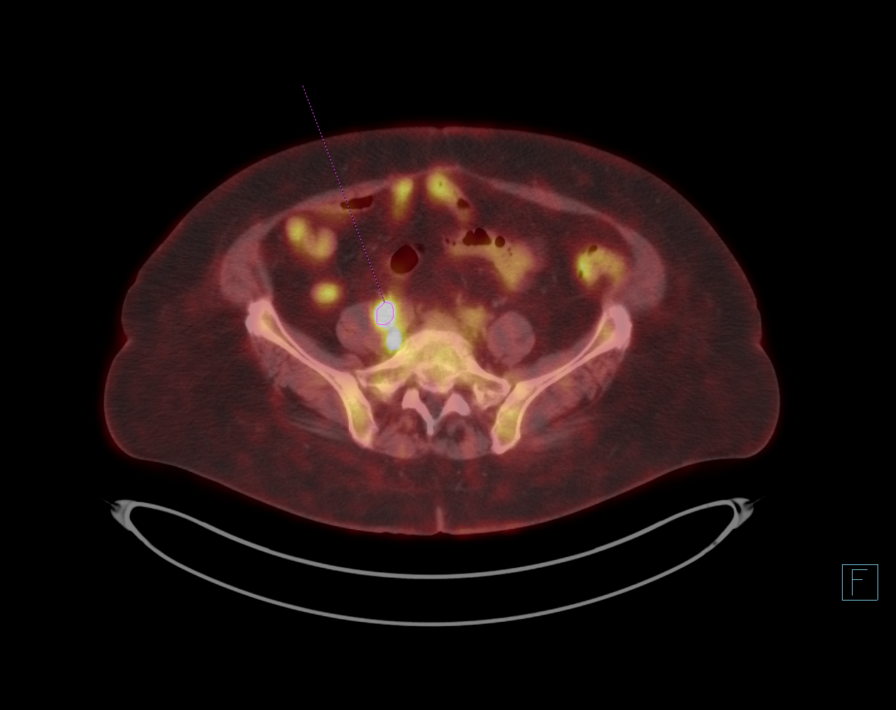
[im 6/7]
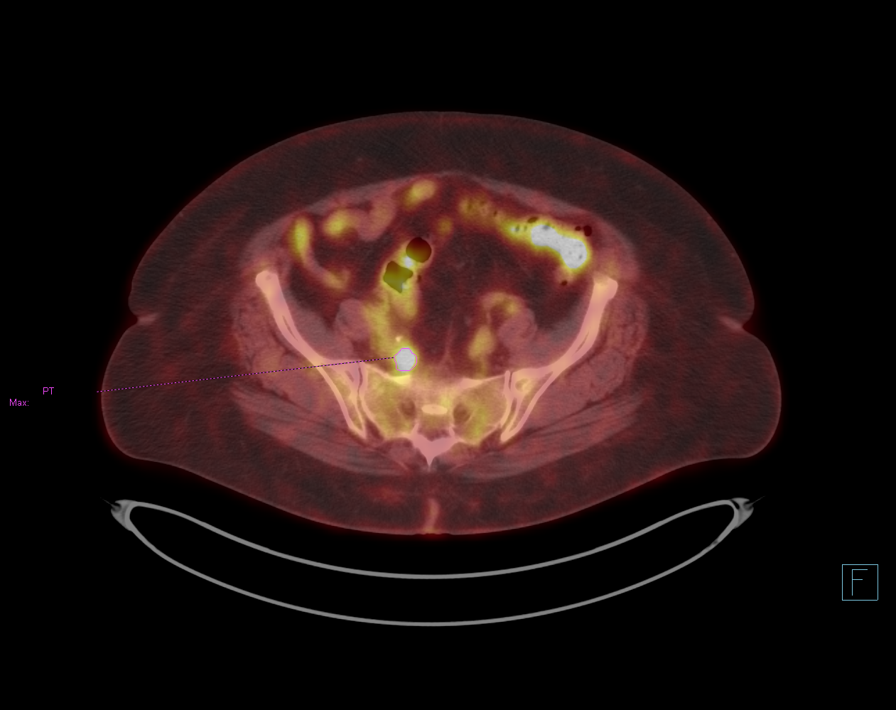
[im 7/7]
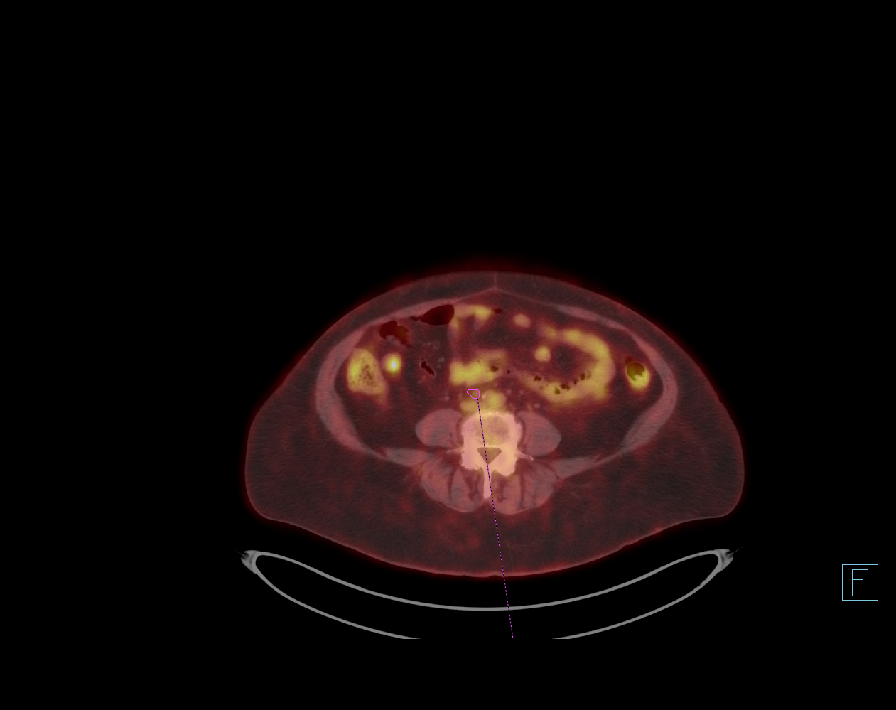

[7 of 7 positions shown; findings below may reference images not displayed]

FINDINGS: Mediastinal blood pool activity: SUV max

Liver activity: SUV max NA

NECK: No hypermetabolic lymph nodes in the neck.

Incidental CT findings: none

CHEST: Normal morphologic appearance of bilateral axillary lymph
nodes with mild FDG uptake, maximum SUV of uptake in a LEFT axillary
lymph node on image 63 of series 4 is 2.8. No frankly hypermetabolic
lymph nodes in the chest

Intensely hypermetabolic area in the subcutaneous fat and along the
dermal layer of LEFT back (image 64, series 4) this is just to the
LEFT of midline corresponding to a small ovoid area measuring 12 mm
with a maximum SUV of

Incidental CT findings: Minimal calcified atheromatous plaque. Heart
size is normal without pericardial effusion. Aortic caliber is
normal. Central pulmonary vascular caliber is normal. Limited
assessment of cardiovascular structures given lack of intravenous
contrast. Calcified granuloma in the LEFT upper lobe. Calcified
granulomata of about the LEFT hilum. No effusion. No consolidation.
Airways are patent.

Scattered small lymph nodes throughout the mediastinum

ABDOMEN/PELVIS: Markedly hypermetabolic cervical mass.
Hypermetabolic activity extends into the uterine body. There was
some distension of the endometrium and heterogeneous signal within
the endometrial canal on the previous study. Maximum SUV in the
cervix and uterus, nearly uniform in terms of FDG uptake is
approximately 20.1. This uptake encompasses approximately 8 cm
craniocaudal extent along the axis of the uterus from the cervix
through the uterine fundus.

RIGHT common iliac lymph node (image 147, series [DATE] cm, maximum
SUV of

Similar activity seen in a smaller lymph node outlined in the MRI as
well along posterior and lateral common iliac chain on image 147 as
well this measures approximately 9 mm short axis.

Internal iliac lymph node on the RIGHT with a maximum SUV of 10
measuring 1.4 cm on image 153 of series 4.

LEFT external iliac lymph node (image 160, series 4) similarly
hypermetabolic.

Scattered small lymph nodes throughout the retroperitoneum lack
profound uptake. No focal area of solid organ uptake. Background
liver uptake is heterogeneous.

Hypermetabolic area along the anal canal, of uncertain significance
though likely physiologic

Incidental CT findings: Mildly lobular hepatic contours. Spleen top
normal size. Sludge in the gallbladder. Pancreas with normal
noncontrast appearance. Adrenal glands are normal. Nephrolithiasis
on the RIGHT, 5 mm upper pole calculus 7 mm RIGHT lower pole
calculus. No hydronephrosis. Colonic diverticulosis. Normal
appendix. Aortic atherosclerosis in the abdomen without aneurysmal
dilation. Pelvic adenopathy as seen on recent MRI of the pelvis. See
above for details regarding cervix and cervical uptake.

SKELETON: No focal hypermetabolic activity to suggest skeletal
metastasis. Soft tissue lesion as described in the upper to mid back
in the subcutaneous fat and superficial soft tissues.

Incidental CT findings: none
IMPRESSION: 1. Large cervical mass extending into the endometrial canal to the
level of the uterine fundus, associated with pelvic adenopathy to
the level of the RIGHT common iliac chain. Please refer to MRI for
further detail.
2. No signs of solid organ uptake or metastatic disease to the
chest.
3. Subcutaneous nodule with marked increased metabolic activity,
correlation with direct clinical inspection is suggested to exclude
cutaneous neoplasm or subcutaneous nodule in this location.
Complicated sebaceous cyst could also potentially have this
appearance.
4. Uptake in the anal canal could likely physiologic. Given the
intense nature of the uptake would suggest correlation with direct
clinical inspection/examination.
5. Uptake in axillary lymph nodes with activity less than
mediastinal blood pool likely small reactive lymph nodes, attention
on follow-up. Morphologic features also with benign appearance.

## 2022-12-13 MED FILL — Dexamethasone Sodium Phosphate Inj 100 MG/10ML: INTRAMUSCULAR | Qty: 1 | Status: AC

## 2022-12-13 MED FILL — Fosaprepitant Dimeglumine For IV Infusion 150 MG (Base Eq): INTRAVENOUS | Qty: 5 | Status: AC

## 2022-12-14 ENCOUNTER — Other Ambulatory Visit: Payer: Self-pay | Admitting: Hematology and Oncology

## 2022-12-14 ENCOUNTER — Inpatient Hospital Stay: Payer: Medicare PPO

## 2022-12-14 ENCOUNTER — Inpatient Hospital Stay: Payer: Medicare PPO | Attending: Radiation Oncology

## 2022-12-14 ENCOUNTER — Inpatient Hospital Stay (HOSPITAL_BASED_OUTPATIENT_CLINIC_OR_DEPARTMENT_OTHER): Payer: Medicare PPO | Admitting: Hematology and Oncology

## 2022-12-14 ENCOUNTER — Encounter: Payer: Self-pay | Admitting: Hematology and Oncology

## 2022-12-14 VITALS — BP 131/92 | HR 93 | Resp 18 | Ht 62.0 in | Wt 202.0 lb

## 2022-12-14 VITALS — BP 153/69 | HR 81 | Temp 98.0°F

## 2022-12-14 DIAGNOSIS — C7989 Secondary malignant neoplasm of other specified sites: Secondary | ICD-10-CM | POA: Insufficient documentation

## 2022-12-14 DIAGNOSIS — G620 Drug-induced polyneuropathy: Secondary | ICD-10-CM | POA: Insufficient documentation

## 2022-12-14 DIAGNOSIS — Z8541 Personal history of malignant neoplasm of cervix uteri: Secondary | ICD-10-CM | POA: Insufficient documentation

## 2022-12-14 DIAGNOSIS — C539 Malignant neoplasm of cervix uteri, unspecified: Secondary | ICD-10-CM | POA: Diagnosis present

## 2022-12-14 DIAGNOSIS — Z170 Estrogen receptor positive status [ER+]: Secondary | ICD-10-CM | POA: Diagnosis not present

## 2022-12-14 DIAGNOSIS — E119 Type 2 diabetes mellitus without complications: Secondary | ICD-10-CM | POA: Diagnosis not present

## 2022-12-14 DIAGNOSIS — E1165 Type 2 diabetes mellitus with hyperglycemia: Secondary | ICD-10-CM | POA: Insufficient documentation

## 2022-12-14 DIAGNOSIS — C538 Malignant neoplasm of overlapping sites of cervix uteri: Secondary | ICD-10-CM | POA: Diagnosis not present

## 2022-12-14 DIAGNOSIS — Z79899 Other long term (current) drug therapy: Secondary | ICD-10-CM | POA: Diagnosis not present

## 2022-12-14 DIAGNOSIS — Z5112 Encounter for antineoplastic immunotherapy: Secondary | ICD-10-CM | POA: Insufficient documentation

## 2022-12-14 DIAGNOSIS — E1122 Type 2 diabetes mellitus with diabetic chronic kidney disease: Secondary | ICD-10-CM | POA: Insufficient documentation

## 2022-12-14 DIAGNOSIS — Z5111 Encounter for antineoplastic chemotherapy: Secondary | ICD-10-CM | POA: Insufficient documentation

## 2022-12-14 DIAGNOSIS — D61818 Other pancytopenia: Secondary | ICD-10-CM

## 2022-12-14 DIAGNOSIS — T783XXA Angioneurotic edema, initial encounter: Secondary | ICD-10-CM | POA: Diagnosis not present

## 2022-12-14 DIAGNOSIS — I129 Hypertensive chronic kidney disease with stage 1 through stage 4 chronic kidney disease, or unspecified chronic kidney disease: Secondary | ICD-10-CM | POA: Insufficient documentation

## 2022-12-14 DIAGNOSIS — Y848 Other medical procedures as the cause of abnormal reaction of the patient, or of later complication, without mention of misadventure at the time of the procedure: Secondary | ICD-10-CM | POA: Diagnosis not present

## 2022-12-14 DIAGNOSIS — N183 Chronic kidney disease, stage 3 unspecified: Secondary | ICD-10-CM | POA: Diagnosis not present

## 2022-12-14 DIAGNOSIS — T451X5A Adverse effect of antineoplastic and immunosuppressive drugs, initial encounter: Secondary | ICD-10-CM | POA: Diagnosis not present

## 2022-12-14 LAB — CMP (CANCER CENTER ONLY)
ALT: 11 U/L (ref 0–44)
AST: 16 U/L (ref 15–41)
Albumin: 4.3 g/dL (ref 3.5–5.0)
Alkaline Phosphatase: 48 U/L (ref 38–126)
Anion gap: 8 (ref 5–15)
BUN: 21 mg/dL (ref 8–23)
CO2: 25 mmol/L (ref 22–32)
Calcium: 10.6 mg/dL — ABNORMAL HIGH (ref 8.9–10.3)
Chloride: 103 mmol/L (ref 98–111)
Creatinine: 1.25 mg/dL — ABNORMAL HIGH (ref 0.44–1.00)
GFR, Estimated: 46 mL/min — ABNORMAL LOW (ref >60)
Glucose, Bld: 299 mg/dL — ABNORMAL HIGH (ref 70–99)
Potassium: 4.7 mmol/L (ref 3.5–5.1)
Sodium: 136 mmol/L (ref 135–145)
Total Bilirubin: 0.6 mg/dL (ref 0.3–1.2)
Total Protein: 7.4 g/dL (ref 6.5–8.1)

## 2022-12-14 LAB — CBC WITH DIFFERENTIAL (CANCER CENTER ONLY)
Abs Immature Granulocytes: 0.02 10*3/uL (ref 0.00–0.07)
Basophils Absolute: 0 10*3/uL (ref 0.0–0.1)
Basophils Relative: 0 %
Eosinophils Absolute: 0 10*3/uL (ref 0.0–0.5)
Eosinophils Relative: 0 %
HCT: 29 % — ABNORMAL LOW (ref 36.0–46.0)
Hemoglobin: 9.6 g/dL — ABNORMAL LOW (ref 12.0–15.0)
Immature Granulocytes: 1 %
Lymphocytes Relative: 15 %
Lymphs Abs: 0.4 10*3/uL — ABNORMAL LOW (ref 0.7–4.0)
MCH: 35 pg — ABNORMAL HIGH (ref 26.0–34.0)
MCHC: 33.1 g/dL (ref 30.0–36.0)
MCV: 105.8 fL — ABNORMAL HIGH (ref 80.0–100.0)
Monocytes Absolute: 0 10*3/uL — ABNORMAL LOW (ref 0.1–1.0)
Monocytes Relative: 1 %
Neutro Abs: 1.9 10*3/uL (ref 1.7–7.7)
Neutrophils Relative %: 83 %
Platelet Count: 240 10*3/uL (ref 150–400)
RBC: 2.74 MIL/uL — ABNORMAL LOW (ref 3.87–5.11)
RDW: 21.3 % — ABNORMAL HIGH (ref 11.5–15.5)
WBC Count: 2.3 10*3/uL — ABNORMAL LOW (ref 4.0–10.5)
nRBC: 0 % (ref 0.0–0.2)

## 2022-12-14 LAB — TSH: TSH: 1.392 u[IU]/mL (ref 0.350–4.500)

## 2022-12-14 MED ORDER — CETIRIZINE HCL 10 MG/ML IV SOLN
10.0000 mg | Freq: Once | INTRAVENOUS | Status: AC
  Administered 2022-12-14: 10 mg via INTRAVENOUS
  Filled 2022-12-14: qty 1

## 2022-12-14 MED ORDER — SODIUM CHLORIDE 0.9 % IV SOLN
15.0000 mg/kg | Freq: Once | INTRAVENOUS | Status: AC
  Administered 2022-12-14: 1400 mg via INTRAVENOUS
  Filled 2022-12-14: qty 8

## 2022-12-14 MED ORDER — SODIUM CHLORIDE 0.9 % IV SOLN
105.0000 mg/m2 | Freq: Once | INTRAVENOUS | Status: AC
  Administered 2022-12-14: 210 mg via INTRAVENOUS
  Filled 2022-12-14: qty 35

## 2022-12-14 MED ORDER — SODIUM CHLORIDE 0.9% FLUSH
10.0000 mL | INTRAVENOUS | Status: DC | PRN
  Administered 2022-12-14: 10 mL

## 2022-12-14 MED ORDER — SODIUM CHLORIDE 0.9 % IV SOLN
469.0000 mg | Freq: Once | INTRAVENOUS | Status: AC
  Administered 2022-12-14: 470 mg via INTRAVENOUS
  Filled 2022-12-14: qty 47

## 2022-12-14 MED ORDER — FAMOTIDINE IN NACL 20-0.9 MG/50ML-% IV SOLN
20.0000 mg | Freq: Once | INTRAVENOUS | Status: AC
  Administered 2022-12-14: 20 mg via INTRAVENOUS
  Filled 2022-12-14: qty 50

## 2022-12-14 MED ORDER — HEPARIN SOD (PORK) LOCK FLUSH 100 UNIT/ML IV SOLN
500.0000 [IU] | Freq: Once | INTRAVENOUS | Status: AC | PRN
  Administered 2022-12-14: 500 [IU]

## 2022-12-14 MED ORDER — SODIUM CHLORIDE 0.9 % IV SOLN
150.0000 mg | Freq: Once | INTRAVENOUS | Status: AC
  Administered 2022-12-14: 150 mg via INTRAVENOUS
  Filled 2022-12-14: qty 150

## 2022-12-14 MED ORDER — SODIUM CHLORIDE 0.9% FLUSH
10.0000 mL | Freq: Once | INTRAVENOUS | Status: AC
  Administered 2022-12-14: 10 mL

## 2022-12-14 MED ORDER — SODIUM CHLORIDE 0.9 % IV SOLN: Freq: Once | INTRAVENOUS | Status: AC

## 2022-12-14 MED ORDER — INSULIN ASPART 100 UNIT/ML IJ SOLN
10.0000 [IU] | Freq: Once | INTRAMUSCULAR | Status: AC
  Administered 2022-12-14: 10 [IU] via SUBCUTANEOUS
  Filled 2022-12-14: qty 1

## 2022-12-14 MED ORDER — SODIUM CHLORIDE 0.9 % IV SOLN
200.0000 mg | Freq: Once | INTRAVENOUS | Status: AC
  Administered 2022-12-14: 200 mg via INTRAVENOUS
  Filled 2022-12-14: qty 200

## 2022-12-14 MED ORDER — PALONOSETRON HCL INJECTION 0.25 MG/5ML
0.2500 mg | Freq: Once | INTRAVENOUS | Status: AC
  Administered 2022-12-14: 0.25 mg via INTRAVENOUS
  Filled 2022-12-14: qty 5

## 2022-12-14 MED ORDER — SODIUM CHLORIDE 0.9 % IV SOLN
10.0000 mg | Freq: Once | INTRAVENOUS | Status: AC
  Administered 2022-12-14: 10 mg via INTRAVENOUS
  Filled 2022-12-14: qty 10

## 2022-12-14 NOTE — Patient Instructions (Signed)
[facility]  Discharge Instructions: Thank you for choosing [facility] to provide your oncology and hematology care.   If you have a lab appointment with the Cancer Center, please go directly to the Cancer Center and check in at the registration area.   Wear comfortable clothing and clothing appropriate for easy access to any Portacath or PICC line.   We strive to give you quality time with your provider. You may need to reschedule your appointment if you arrive late (15 or more minutes).  Arriving late affects you and other patients whose appointments are after yours.  Also, if you miss three or more appointments without notifying the office, you may be dismissed from the clinic at the provider's discretion.      For prescription refill requests, have your pharmacy contact our office and allow 72 hours for refills to be completed.    Today you received the following chemotherapy and/or immunotherapy agents: pembrolizumab, bevacizumab, paclitaxel and carboplatin      To help prevent nausea and vomiting after your treatment, we encourage you to take your nausea medication as directed.  BELOW ARE SYMPTOMS THAT SHOULD BE REPORTED IMMEDIATELY: *FEVER GREATER THAN 100.4 F (38 C) OR HIGHER *CHILLS OR SWEATING *NAUSEA AND VOMITING THAT IS NOT CONTROLLED WITH YOUR NAUSEA MEDICATION *UNUSUAL SHORTNESS OF BREATH *UNUSUAL BRUISING OR BLEEDING *URINARY PROBLEMS (pain or burning when urinating, or frequent urination) *BOWEL PROBLEMS (unusual diarrhea, constipation, pain near the anus) TENDERNESS IN MOUTH AND THROAT WITH OR WITHOUT PRESENCE OF ULCERS (sore throat, sores in mouth, or a toothache) UNUSUAL RASH, SWELLING OR PAIN  UNUSUAL VAGINAL DISCHARGE OR ITCHING   Items with * indicate a potential emergency and should be followed up as soon as possible or go to the Emergency Department if any problems should occur.  Please show the CHEMOTHERAPY  ALERT CARD or IMMUNOTHERAPY ALERT CARD at check-in to the Emergency Department and triage nurse.  Should you have questions after your visit or need to cancel or reschedule your appointment, please contact [facility]  Dept: [phone]  and follow the prompts.  Office hours are 8:00 a.m. to 4:30 p.m. Monday - Friday. Please note that voicemails left after 4:00 p.m. may not be returned until the following business day.  We are closed weekends and major holidays. You have access to a nurse at all times for urgent questions. Please call the main number to the clinic Dept: [phone] and follow the prompts.   For any non-urgent questions, you may also contact your provider using MyChart. We now offer e-Visits for anyone 18 and older to request care online for non-urgent symptoms. For details visit [email or web address].   Also download the MyChart app! Go to the app store, search "MyChart", open the app, select , and log in with your MyChart username and password.   

## 2022-12-15 ENCOUNTER — Encounter: Payer: Self-pay | Admitting: Hematology and Oncology

## 2022-12-15 ENCOUNTER — Other Ambulatory Visit: Payer: Self-pay

## 2022-12-15 LAB — T4: T4, Total: 10.7 ug/dL (ref 4.5–12.0)

## 2022-12-15 NOTE — Assessment & Plan Note (Signed)
She has intermittent elevated serum creatinine She has multiple risk factors including diabetes and hypertension I will adjust the dose of her treatment accordingly 

## 2022-12-15 NOTE — Assessment & Plan Note (Signed)
She has uncontrolled hyperglycemia This could contribute to neuropathy She will get 1 dose of insulin injection We spent extensive amount of time reviewing the importance of close follow-up and management of diabetes

## 2022-12-15 NOTE — Progress Notes (Signed)
[facility] OFFICE PROGRESS NOTE  Patient Care Team: Marletta Lor, NP as PCP - General (Nurse Practitioner)  ASSESSMENT & PLAN:  Cervical cancer [facility]) She is experiencing multiple side effects of treatment including worsening neuropathy and uncontrolled hyperglycemia I plan to reduce the dose of treatment We have extensive discussion about the importance of lifestyle modification while on treatment  Diabetes mellitus without complication (HCC) She has uncontrolled hyperglycemia This could contribute to neuropathy She will get 1 dose of insulin injection We spent extensive amount of time reviewing the importance of close follow-up and management of diabetes  Pancytopenia, acquired (HCC) She has multifactorial pancytopenia, likely due to chemotherapy and history of B12 deficiency Will proceed with treatment without delay She does not need transfusion support   Chronic kidney disease (CKD), stage III (moderate) (HCC) She has intermittent elevated serum creatinine She has multiple risk factors including diabetes and hypertension I will adjust the dose of her treatment accordingly  Orders Placed This Encounter  Procedures   Total Protein, Urine dipstick    Standing Status:   Standing    Number of Occurrences:   22    Standing Expiration Date:   12/14/2023   Urinalysis, Complete w Microscopic    Standing Status:   Future    Standing Expiration Date:   12/14/2023    All questions were answered. The patient knows to call the clinic with any problems, questions or concerns. The total time spent in the appointment was 40 minutes encounter with patients including review of chart and various tests results, discussions about plan of care and coordination of care plan   Artis Delay, MD 12/15/2022 2:50 PM  INTERVAL HISTORY: Please see below for problem oriented charting. she returns for treatment follow-up seen prior to chemotherapy She has slight worsening peripheral  neuropathy since her last visit The patient does not check her blood sugar She had recent urinary tract infection that has cleared up Denies recent nausea or constipation  REVIEW OF SYSTEMS:   Constitutional: Denies fevers, chills or abnormal weight loss Eyes: Denies blurriness of vision Ears, nose, mouth, throat, and face: Denies mucositis or sore throat Respiratory: Denies cough, dyspnea or wheezes Cardiovascular: Denies palpitation, chest discomfort or lower extremity swelling Gastrointestinal:  Denies nausea, heartburn or change in bowel habits Skin: Denies abnormal skin rashes Lymphatics: Denies new lymphadenopathy or easy bruising Behavioral/Psych: Mood is stable, no new changes  All other systems were reviewed with the patient and are negative.  I have reviewed the past medical history, past surgical history, social history and family history with the patient and they are unchanged from previous note.  ALLERGIES:  has No Known Allergies.  MEDICATIONS:  Current Outpatient Medications  Medication Sig Dispense Refill   atenolol (TENORMIN) 25 MG tablet Take 1 tablet (25 mg total) by mouth daily. Take in the morning 30 tablet 1   calcium carbonate (TUMS - DOSED IN MG ELEMENTAL CALCIUM) 500 MG chewable tablet Chew 1 tablet by mouth daily as needed for indigestion or heartburn.     dexamethasone (DECADRON) 4 MG tablet Take 2 tabs at the night before chemotherapy, every 3 weeks, by mouth x 6 cycles 36 tablet 6   ezetimibe (ZETIA) 10 MG tablet Take 10 mg by mouth in the morning.     glimepiride (AMARYL) 1 MG tablet Take 1 mg by mouth 2 (two) times daily.     lidocaine-prilocaine (EMLA) cream Apply to affected area once 30 g 3   lisinopril (ZESTRIL) 10  MG tablet Take 1 tablet (10 mg total) by mouth daily. Take in the evening 30 tablet 1   magnesium oxide (MAG-OX) 400 (241.3 Mg) MG tablet Take 1 tablet (400 mg total) by mouth 2 (two) times daily. 60 tablet 11   ondansetron (ZOFRAN) 8 MG  tablet Take 1 tablet (8 mg total) by mouth every 8 (eight) hours as needed for nausea or vomiting. Start on the third day after chemotherapy. 30 tablet 1   prochlorperazine (COMPAZINE) 10 MG tablet Take 1 tablet (10 mg total) by mouth every 6 (six) hours as needed for nausea or vomiting. 30 tablet 1   rosuvastatin (CRESTOR) 40 MG tablet Take 40 mg by mouth at bedtime.     sitaGLIPtin (JANUVIA) 100 MG tablet Take 50 mg by mouth in the morning.     traMADol (ULTRAM) 50 MG tablet Take 1 tablet (50 mg total) by mouth every 6 (six) hours as needed. 30 tablet 0   vitamin B-12 (CYANOCOBALAMIN) 1000 MCG tablet Take 1,000 mcg by mouth in the morning.     No current facility-administered medications for this visit.    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   08/27/2020 Imaging   MRI pelvis Large cervical mass with pelvic adenopathy, at least T2B N1 disease. Question of involvement of posterior urinary bladder (T4) for which additional images have been requested. Patient will return for additional images to answer this question. (Thinner section axial T2 without fat sat and sagittal T2 weighted imaging also without fat saturation.)   Small field-of-view images show that the parametrial extension comes in very close proximity to the RIGHT ureter.   08/30/2020 Initial Diagnosis   Cervical cancer (HCC)   08/30/2020 Cancer Staging   Staging form: Cervix Uteri, AJCC Version 9 - Clinical stage from 08/30/2020: FIGO Stage IVB (rcT2, cN1, pM1) - Signed by Artis DelayGorsuch, Chamara Dyck, MD on 09/01/2022 Stage prefix: Recurrence   09/01/2020 PET scan   1. Large cervical mass extending into the endometrial canal to the level of the uterine fundus, associated with  pelvic adenopathy to the level of the RIGHT common iliac chain. Please refer to MRI for further detail. 2. No signs of solid organ uptake or metastatic disease to the chest. 3. Subcutaneous nodule with marked increased metabolic activity, correlation with direct clinical inspection is suggested to exclude cutaneous neoplasm or subcutaneous nodule in this location. Complicated sebaceous cyst could also potentially have this appearance. 4. Uptake in the anal canal could likely physiologic. Given the intense nature of the uptake would suggest correlation with direct clinical inspection/examination. 5. Uptake in axillary lymph nodes with activity less than mediastinal blood pool likely small reactive lymph nodes, attention on follow-up. Morphologic features also with benign appearance.   09/16/2020 Procedure   Successful placement of a power injectable Port-A-Cath via the right internal jugular vein. The catheter is ready for immediate use.     09/20/2020 - 10/25/2020 Chemotherapy   She received cisplatin chemo with concurrent radiation       09/22/2020 - 12/08/2020 Radiation Therapy   Radiation Treatment Dates: 09/22/2020 through 12/08/2020   Site: Pelvis Technique: IMRT Total Dose (Gy): 45/45 Dose per Fx (Gy): 1.8 Completed Fx: 25/25 Beam Energies: 6x   Site: pelvic boost Technique: IMRT Total Dose (Gy): 7.2/7.2 Dose per  Fx (Gy): 1.8 Completed Fx: 4/4 Beam Energies: 6x   Site: Cervix boost Technique: HDR-brachytherapy Total Dose (Gy): 27.5/27.5 Dose per Fx (Gy): 5.5 Completed Fx: 5/5 Beam Energies: Ir-192     03/17/2021 PET scan   1. Mark positive response to therapy. 2. Resolution of metabolic activity within the uterine body. 3. Resolution of size and metabolic activity of pelvic lymph nodes. 4. No evidence of new disease outside of the pelvis.   08/10/2022 PET scan   1. New hypermetabolic bilateral pulmonary nodules and hypermetabolic cervical and hilar lymph nodes, consistent  with metastatic disease. 2. Hypermetabolic focus in the anterior right third costochondral junction without discrete CT correlate is also compatible with metastatic disease. 3. Prior hysterectomy without suspicious hypermetabolic nodularity along the vaginal cuff. 4.  Aortic Atherosclerosis (ICD10-I70.0).   08/24/2022 Pathology Results   A. SOFT TISSUE NODULE, RIGHT CHEST WALL, NEEDLE CORE BIOPSY:  Poorly differentiated carcinoma.  See comment.   COMMENT:  The carcinoma is characterized by nest of malignant epithelioid cells which focally has features suggestive of squamous differentiation morphologically.  Immunohistochemistry is positive with cytokeratin 7, PAX8, MOC-31 and estrogen receptor.  There is patchy positivity with p16, progesterone receptor and TTF-1.  The tumor is negative with p40, cytokeratin 5/6, p63, CDX2, cytokeratin 20 and WT-1.  The immunophenotype is consistent with adenocarcinoma and suggestive of a gynecologic primary.    09/08/2022 -  Chemotherapy   Patient is on Treatment Plan : OVARIAN Carboplatin + Paclitaxel + Bevacizumab q21d      11/07/2022 Imaging   1. Decreased size of the pulmonary nodules and mediastinal adenopathy, consistent with treatment response. 2. Similar appearance of the previously hypermetabolic soft tissue subjacent to the right anterior third costochondral cartilage. 3. No convincing evidence of new or progressive disease in the chest, abdomen or pelvis. 4. Rectal wall thickening with stranding in the mesorectal fat and diffuse thickening of the urinary bladder wall, nonspecific but possibly reflecting post radiation change. 5. Nonobstructive right renal stones measure up to 6 mm in the renal pelvis. 6. Colonic diverticulosis without findings of acute diverticulitis. 7.  Aortic Atherosclerosis (ICD10-I70.0).   Malignant neoplasm of cervix  09/01/2020 Initial Diagnosis   Malignant neoplasm of cervix (HCC)   09/20/2020 - 10/25/2020 Chemotherapy    She received cisplatin chemo with concurrent radiation       09/22/2020 - 12/08/2020 Radiation Therapy   Radiation Treatment Dates: 09/22/2020 through 12/08/2020   Site: Pelvis Technique: IMRT Total Dose (Gy): 45/45 Dose per Fx (Gy): 1.8 Completed Fx: 25/25 Beam Energies: 6x   Site: pelvic boost Technique: IMRT Total Dose (Gy): 7.2/7.2 Dose per Fx (Gy): 1.8 Completed Fx: 4/4 Beam Energies: 6x   Site: Cervix boost Technique: HDR-brachytherapy Total Dose (Gy): 27.5/27.5 Dose per Fx (Gy): 5.5 Completed Fx: 5/5 Beam Energies: Ir-192   09/08/2022 -  Chemotherapy   Patient is on Treatment Plan : OVARIAN Carboplatin + Paclitaxel + Bevacizumab q21d        PHYSICAL EXAMINATION: ECOG PERFORMANCE STATUS: 1 - Symptomatic but completely ambulatory  Vitals:   12/14/22 0822  BP: (!) 131/92  Pulse: 93  Resp: 18  SpO2: 100%   Filed Weights   12/14/22 0822  Weight: 202 lb (91.6 kg)    GENERAL:alert, no distress and comfortable  NEURO: alert & oriented x 3 with fluent speech, no focal motor/sensory deficits  LABORATORY DATA:  I have reviewed the data as listed    Component Value Date/Time   NA 136 12/14/2022 0747  K 4.7 12/14/2022 0747   CL 103 12/14/2022 0747   CO2 25 12/14/2022 0747   GLUCOSE 299 (H) 12/14/2022 0747   BUN 21 12/14/2022 0747   CREATININE 1.25 (H) 12/14/2022 0747   CALCIUM 10.6 (H) 12/14/2022 0747   PROT 7.4 12/14/2022 0747   ALBUMIN 4.3 12/14/2022 0747   AST 16 12/14/2022 0747   ALT 11 12/14/2022 0747   ALKPHOS 48 12/14/2022 0747   BILITOT 0.6 12/14/2022 0747   GFRNONAA 46 (L) 12/14/2022 0747    No results found for: "SPEP", "UPEP"  Lab Results  Component Value Date   WBC 2.3 (L) 12/14/2022   NEUTROABS 1.9 12/14/2022   HGB 9.6 (L) 12/14/2022   HCT 29.0 (L) 12/14/2022   MCV 105.8 (H) 12/14/2022   PLT 240 12/14/2022      Chemistry      Component Value Date/Time   NA 136 12/14/2022 0747   K 4.7 12/14/2022 0747   CL 103 12/14/2022  0747   CO2 25 12/14/2022 0747   BUN 21 12/14/2022 0747   CREATININE 1.25 (H) 12/14/2022 0747      Component Value Date/Time   CALCIUM 10.6 (H) 12/14/2022 0747   ALKPHOS 48 12/14/2022 0747   AST 16 12/14/2022 0747   ALT 11 12/14/2022 0747   BILITOT 0.6 12/14/2022 0747

## 2022-12-15 NOTE — Assessment & Plan Note (Signed)
She has multifactorial pancytopenia, likely due to chemotherapy and history of B12 deficiency Will proceed with treatment without delay She does not need transfusion support

## 2022-12-15 NOTE — Assessment & Plan Note (Signed)
She is experiencing multiple side effects of treatment including worsening neuropathy and uncontrolled hyperglycemia I plan to reduce the dose of treatment We have extensive discussion about the importance of lifestyle modification while on treatment

## 2023-01-03 MED FILL — Fosaprepitant Dimeglumine For IV Infusion 150 MG (Base Eq): INTRAVENOUS | Qty: 5 | Status: AC

## 2023-01-03 MED FILL — Dexamethasone Sodium Phosphate Inj 100 MG/10ML: INTRAMUSCULAR | Qty: 1 | Status: AC

## 2023-01-04 ENCOUNTER — Inpatient Hospital Stay: Payer: Medicare PPO

## 2023-01-04 ENCOUNTER — Other Ambulatory Visit: Payer: Self-pay

## 2023-01-04 ENCOUNTER — Observation Stay (HOSPITAL_COMMUNITY)
Admission: EM | Admit: 2023-01-04 | Discharge: 2023-01-05 | Disposition: A | Discharge status: Home / Self Care | Payer: Medicare PPO | Attending: Internal Medicine | Admitting: Internal Medicine

## 2023-01-04 ENCOUNTER — Encounter (HOSPITAL_COMMUNITY): Payer: Self-pay

## 2023-01-04 ENCOUNTER — Inpatient Hospital Stay (HOSPITAL_BASED_OUTPATIENT_CLINIC_OR_DEPARTMENT_OTHER): Payer: Medicare PPO | Admitting: Physician Assistant

## 2023-01-04 ENCOUNTER — Inpatient Hospital Stay: Payer: Medicare PPO | Admitting: Hematology and Oncology

## 2023-01-04 VITALS — BP 183/82 | HR 97 | Resp 19

## 2023-01-04 DIAGNOSIS — D696 Thrombocytopenia, unspecified: Secondary | ICD-10-CM | POA: Insufficient documentation

## 2023-01-04 DIAGNOSIS — Z8541 Personal history of malignant neoplasm of cervix uteri: Secondary | ICD-10-CM | POA: Insufficient documentation

## 2023-01-04 DIAGNOSIS — Z79899 Other long term (current) drug therapy: Secondary | ICD-10-CM | POA: Insufficient documentation

## 2023-01-04 DIAGNOSIS — E1122 Type 2 diabetes mellitus with diabetic chronic kidney disease: Secondary | ICD-10-CM | POA: Insufficient documentation

## 2023-01-04 DIAGNOSIS — R220 Localized swelling, mass and lump, head: Principal | ICD-10-CM | POA: Insufficient documentation

## 2023-01-04 DIAGNOSIS — I10 Essential (primary) hypertension: Secondary | ICD-10-CM | POA: Diagnosis present

## 2023-01-04 DIAGNOSIS — N1831 Chronic kidney disease, stage 3a: Secondary | ICD-10-CM | POA: Diagnosis not present

## 2023-01-04 DIAGNOSIS — C538 Malignant neoplasm of overlapping sites of cervix uteri: Secondary | ICD-10-CM

## 2023-01-04 DIAGNOSIS — T783XXA Angioneurotic edema, initial encounter: Secondary | ICD-10-CM

## 2023-01-04 DIAGNOSIS — C539 Malignant neoplasm of cervix uteri, unspecified: Secondary | ICD-10-CM | POA: Diagnosis present

## 2023-01-04 DIAGNOSIS — I129 Hypertensive chronic kidney disease with stage 1 through stage 4 chronic kidney disease, or unspecified chronic kidney disease: Secondary | ICD-10-CM | POA: Insufficient documentation

## 2023-01-04 DIAGNOSIS — E1165 Type 2 diabetes mellitus with hyperglycemia: Secondary | ICD-10-CM | POA: Insufficient documentation

## 2023-01-04 DIAGNOSIS — D539 Nutritional anemia, unspecified: Secondary | ICD-10-CM | POA: Diagnosis present

## 2023-01-04 DIAGNOSIS — D61818 Other pancytopenia: Secondary | ICD-10-CM | POA: Diagnosis present

## 2023-01-04 DIAGNOSIS — N183 Chronic kidney disease, stage 3 unspecified: Secondary | ICD-10-CM | POA: Diagnosis present

## 2023-01-04 DIAGNOSIS — E119 Type 2 diabetes mellitus without complications: Secondary | ICD-10-CM

## 2023-01-04 LAB — BASIC METABOLIC PANEL
Anion gap: 11 (ref 5–15)
BUN: 27 mg/dL — ABNORMAL HIGH (ref 8–23)
CO2: 19 mmol/L — ABNORMAL LOW (ref 22–32)
Calcium: 8.5 mg/dL — ABNORMAL LOW (ref 8.9–10.3)
Chloride: 105 mmol/L (ref 98–111)
Creatinine, Ser: 1.08 mg/dL — ABNORMAL HIGH (ref 0.44–1.00)
GFR, Estimated: 55 mL/min — ABNORMAL LOW (ref >60)
Glucose, Bld: 278 mg/dL — ABNORMAL HIGH (ref 70–99)
Potassium: 3.8 mmol/L (ref 3.5–5.1)
Sodium: 135 mmol/L (ref 135–145)

## 2023-01-04 LAB — CBC
HCT: 26.3 % — ABNORMAL LOW (ref 36.0–46.0)
HCT: 25.7 % — ABNORMAL LOW (ref 36.0–46.0)
Hemoglobin: 8.9 g/dL — ABNORMAL LOW (ref 12.0–15.0)
Hemoglobin: 8.7 g/dL — ABNORMAL LOW (ref 12.0–15.0)
MCH: 35.5 pg — ABNORMAL HIGH (ref 26.0–34.0)
MCH: 36 pg — ABNORMAL HIGH (ref 26.0–34.0)
MCHC: 33.8 g/dL (ref 30.0–36.0)
MCHC: 33.9 g/dL (ref 30.0–36.0)
MCV: 104.8 fL — ABNORMAL HIGH (ref 80.0–100.0)
MCV: 106.2 fL — ABNORMAL HIGH (ref 80.0–100.0)
Platelets: 67 10*3/uL — ABNORMAL LOW (ref 150–400)
Platelets: 56 10*3/uL — ABNORMAL LOW (ref 150–400)
RBC: 2.51 MIL/uL — ABNORMAL LOW (ref 3.87–5.11)
RBC: 2.42 MIL/uL — ABNORMAL LOW (ref 3.87–5.11)
RDW: 18.6 % — ABNORMAL HIGH (ref 11.5–15.5)
RDW: 19 % — ABNORMAL HIGH (ref 11.5–15.5)
WBC: 4.7 10*3/uL (ref 4.0–10.5)
WBC: 4.8 10*3/uL (ref 4.0–10.5)
nRBC: 0 % (ref 0.0–0.2)
nRBC: 0 % (ref 0.0–0.2)

## 2023-01-04 LAB — GLUCOSE, CAPILLARY
Glucose-Capillary: 262 mg/dL — ABNORMAL HIGH (ref 70–99)
Glucose-Capillary: 246 mg/dL — ABNORMAL HIGH (ref 70–99)

## 2023-01-04 LAB — CREATININE, SERUM
Creatinine, Ser: 1.07 mg/dL — ABNORMAL HIGH (ref 0.44–1.00)
GFR, Estimated: 56 mL/min — ABNORMAL LOW (ref >60)

## 2023-01-04 LAB — HIV ANTIBODY (ROUTINE TESTING W REFLEX): HIV Screen 4th Generation wRfx: NONREACTIVE

## 2023-01-04 LAB — MRSA NEXT GEN BY PCR, NASAL: MRSA by PCR Next Gen: NOT DETECTED

## 2023-01-04 MED ORDER — INSULIN ASPART 100 UNIT/ML IJ SOLN
0.0000 [IU] | Freq: Three times a day (TID) | INTRAMUSCULAR | Status: DC
  Administered 2023-01-04: 5 [IU] via SUBCUTANEOUS
  Administered 2023-01-05 (×2): 2 [IU] via SUBCUTANEOUS
  Filled 2023-01-04: qty 0.15

## 2023-01-04 MED ORDER — TRAMADOL HCL 50 MG PO TABS: 50.0000 mg | ORAL_TABLET | Freq: Four times a day (QID) | ORAL | Status: DC | PRN

## 2023-01-04 MED ORDER — METHYLPREDNISOLONE SODIUM SUCC 40 MG IJ SOLR
40.0000 mg | Freq: Two times a day (BID) | INTRAMUSCULAR | Status: DC
  Administered 2023-01-04: 40 mg via INTRAVENOUS
  Filled 2023-01-04: qty 1

## 2023-01-04 MED ORDER — CHLORHEXIDINE GLUCONATE CLOTH 2 % EX PADS
6.0000 | MEDICATED_PAD | Freq: Every day | CUTANEOUS | Status: DC
  Administered 2023-01-04 – 2023-01-05 (×2): 6 via TOPICAL

## 2023-01-04 MED ORDER — ATENOLOL 25 MG PO TABS
25.0000 mg | ORAL_TABLET | Freq: Every day | ORAL | Status: DC
  Administered 2023-01-05: 25 mg via ORAL
  Filled 2023-01-04: qty 1

## 2023-01-04 MED ORDER — ENOXAPARIN SODIUM 40 MG/0.4ML IJ SOSY
40.0000 mg | PREFILLED_SYRINGE | Freq: Every day | INTRAMUSCULAR | Status: DC
  Administered 2023-01-04: 40 mg via SUBCUTANEOUS
  Filled 2023-01-04: qty 0.4

## 2023-01-04 MED ORDER — LABETALOL HCL 5 MG/ML IV SOLN
20.0000 mg | INTRAVENOUS | Status: DC | PRN
  Administered 2023-01-04: 20 mg via INTRAVENOUS
  Filled 2023-01-04: qty 4

## 2023-01-04 MED ORDER — ORAL CARE MOUTH RINSE: 15.0000 mL | OROMUCOSAL | Status: DC | PRN

## 2023-01-04 MED ORDER — INSULIN ASPART 100 UNIT/ML IJ SOLN
0.0000 [IU] | Freq: Every day | INTRAMUSCULAR | Status: DC
  Administered 2023-01-04: 2 [IU] via SUBCUTANEOUS
  Filled 2023-01-04: qty 0.05

## 2023-01-04 MED ORDER — FAMOTIDINE IN NACL 20-0.9 MG/50ML-% IV SOLN
20.0000 mg | Freq: Every day | INTRAVENOUS | Status: DC
  Administered 2023-01-04: 20 mg via INTRAVENOUS
  Filled 2023-01-04: qty 50

## 2023-01-04 MED ORDER — DIPHENHYDRAMINE HCL 50 MG/ML IJ SOLN
25.0000 mg | Freq: Four times a day (QID) | INTRAMUSCULAR | Status: DC
  Administered 2023-01-04: 25 mg via INTRAVENOUS
  Filled 2023-01-04: qty 1

## 2023-01-04 MED ORDER — DOCUSATE SODIUM 100 MG PO CAPS: 100.0000 mg | ORAL_CAPSULE | Freq: Two times a day (BID) | ORAL | Status: DC | PRN

## 2023-01-04 MED ORDER — SODIUM CHLORIDE 0.9 % IV SOLN: INTRAVENOUS | Status: DC

## 2023-01-04 MED ORDER — POLYETHYLENE GLYCOL 3350 17 G PO PACK: 17.0000 g | PACK | Freq: Every day | ORAL | Status: DC | PRN

## 2023-01-04 NOTE — ED Notes (Signed)
Pt stated she takes two steroid pills which makes her CBG higher than normal. Took last steroid pills last night 2300.

## 2023-01-04 NOTE — ED Triage Notes (Signed)
Pt came from [facility] with c/o swelling to the tongue from medication reaction. Pt given  of Benadryl at 0920, Solumedrol at 0917, and Pepcid at 0918. Epipen at 0925.  BP 183/82 HR 95 O2 97

## 2023-01-04 NOTE — ED Notes (Signed)
ED TO INPATIENT HANDOFF REPORT  Name/Age/Gender Misty Deleon 72 y.o. female  Code Status    Code Status Orders  (From admission, onward)           Start     Ordered   01/04/23 1241  Full code  Continuous       Question:  By:  Answer:  Default: patient does not have capacity for decision making, no surrogate or prior directive available   01/04/23 1246           Code Status History     Date Active Date Inactive Code Status Order ID Comments User Context   08/24/2022 0944 08/25/2022 0514 Full Code [id number]  Mir, Al Corpus, MD [facility]   12/08/2020 0551 12/08/2020 1413 Full Code [id number]  Antony Blackbird, MD Inpatient   11/25/2020 0649 11/27/2020 0520 Full Code [id number]  Antony Blackbird, MD Inpatient   11/16/2020 0540 11/16/2020 1440 Full Code [id number]  Antony Blackbird, MD Inpatient   11/09/2020 0700 11/09/2020 1550 Full Code [id number]  Antony Blackbird, MD Inpatient      Advance Directive Documentation    Flowsheet Row Most Recent Value  Type of Advance Directive Healthcare Power of Attorney, Living will, Out of facility DNR (pink MOST or yellow form)  Pre-existing out of facility DNR order (yellow form or pink MOST form) --  "MOST" Form in Place? --       Home/SNF/Other Home  Chief Complaint Angioedema [T78.3XXA]  Level of Care/Admitting Diagnosis ED Disposition     ED Disposition  Admit   Condition  --   Comment  Hospital Area: [facility] [[id number]]  Level of Care: ICU [6]  May admit patient to [facility] or [facility] if equivalent level of care is available:: No  Covid Evaluation: Asymptomatic - no recent exposure (last 10 days) testing not required  Diagnosis: Angioedema [[id number]]  Admitting Physician: Karren Burly [[id number]]  Attending Physician: Karren Burly [phone]  Bed request comments: icu  Certification:: I certify there are rare and unusual circumstances requiring inpatient admission  Estimated Length of Stay: 2           Medical History Past Medical History:  Diagnosis Date   CKD (chronic kidney disease), stage III    Deficiency anemia    B12 def.  treated with b12 injection and has received iron infusion's   GERD (gastroesophageal reflux disease)    History of radiation therapy 09/22/20-11/01/20   Cervix, IMRT    Dr. Antony Blackbird   History of radiation therapy 11/09/2020-12/08/2020   cervix, intracavitary brachytherapy   Dr Antony Blackbird   Hypercholesteremia    Hypertension    Hypomagnesemia    Malignant neoplasm cervix oncologist--- dr gorsuch/  radiation oncology--- dr Roselind Messier   dx 12/ 2021,  Stage IIIC-1,  with pelvic adeopathy;  concurrent chemo/ radiation;  chemo started 09-20-2020 and pt completed IMRT 11-01-2020 / scheduled for high dose brachytherapy to start 11-09-2020   PCOS (polycystic ovarian syndrome)    PMB (postmenopausal bleeding)    Type 2 diabetes mellitus    followed by pc   (11-04-2020 pt stated does not check blood sugar)   Wears contact lenses     Allergies No Known Allergies  IV Location/Drains/Wounds Patient Lines/Drains/Airways Status     Active Line/Drains/Airways     Name Placement date Placement time Site Days   Implanted Port 09/16/20 Right Chest 09/16/20  1433  Chest  840   Urethral Catheter maustria Double-lumen;Non-latex 14 Fr.  11/29/20  0750  Double-lumen;Non-latex  766            Labs/Imaging Results for orders placed or performed during the hospital encounter of 01/04/23 (from the past 48 hour(s))  CBC     Status: Abnormal   Collection Time: 01/04/23  9:27 AM  Result Value Ref Range   WBC 4.7 4.0 - 10.5 K/uL   RBC 2.51 (L) 3.87 - 5.11 MIL/uL   Hemoglobin 8.9 (L) 12.0 - 15.0 g/dL   HCT 16.1 (L) 09.6 - 04.5 %   MCV 104.8 (H) 80.0 - 100.0 fL   MCH 35.5 (H) 26.0 - 34.0 pg   MCHC 33.8 30.0 - 36.0 g/dL   RDW 40.9 (H) 81.1 - 91.4 %   Platelets 67 (L) 150 - 400 K/uL    Comment: SPECIMEN CHECKED FOR CLOTS Immature Platelet Fraction may  be clinically indicated, consider ordering this additional test [id number] REPEATED TO VERIFY PLATELET COUNT CONFIRMED BY SMEAR    nRBC 0.0 0.0 - 0.2 %    Comment: Performed at [facility], [address] [place], [place] [address]  Basic metabolic panel     Status: Abnormal   Collection Time: 01/04/23  9:27 AM  Result Value Ref Range   Sodium 135 135 - 145 mmol/L   Potassium 3.8 3.5 - 5.1 mmol/L   Chloride 105 98 - 111 mmol/L   CO2 19 (L) 22 - 32 mmol/L   Glucose, Bld 278 (H) 70 - 99 mg/dL    Comment: Glucose reference range applies only to samples taken after fasting for at least 8 hours.   BUN 27 (H) 8 - 23 mg/dL   Creatinine, Ser 7.84 (H) 0.44 - 1.00 mg/dL   Calcium 8.5 (L) 8.9 - 10.3 mg/dL   GFR, Estimated 55 (L) >60 mL/min    Comment: (NOTE) Calculated using the CKD-EPI Creatinine Equation (2021)    Anion gap 11 5 - 15    Comment: Performed at [facility], [address], [place], [place] [address]   No results found.  Pending Labs Unresulted Labs (From admission, onward)     Start     Ordered   01/11/23 0500  Creatinine, serum  (enoxaparin (LOVENOX)    CrCl >/= 30 ml/min)  Weekly,   R     Comments: while on enoxaparin therapy    01/04/23 1246   01/05/23 0500  CBC  Tomorrow morning,   R        01/04/23 1246   01/05/23 0500  Basic metabolic panel  Tomorrow morning,   R        01/04/23 1246   01/04/23 1246  Hemoglobin A1c  Once,   R       Comments: To assess prior glycemic control    01/04/23 1246   01/04/23 1240  HIV Antibody (routine testing w rflx)  (HIV Antibody (Routine testing w reflex) panel)  Once,   R        01/04/23 1246   01/04/23 1240  CBC  (enoxaparin (LOVENOX)    CrCl >/= 30 ml/min)  Once,   R       Comments: Baseline for enoxaparin therapy IF NOT ALREADY DRAWN.  Notify MD if PLT < 100 K.    01/04/23 1246   01/04/23 1240  Creatinine, serum  (enoxaparin (LOVENOX)    CrCl >/= 30 ml/min)  Once,   R        Comments: Baseline for enoxaparin therapy IF  NOT ALREADY DRAWN.    01/04/23 1246            Vitals/Pain Today's Vitals   01/04/23 1015 01/04/23 1100 01/04/23 1125 01/04/23 1200  BP:      Pulse:  90 88 86  Resp:  16 (!) 21 17  Temp:      TempSrc:      SpO2: 100% 100% 100% 97%  Weight:      Height:        Isolation Precautions No active isolations  Medications Medications  0.9 %  sodium chloride infusion ( Intravenous New Bag/Given 01/04/23 1009)  traMADol (ULTRAM) tablet 50 mg (has no administration in time range)  docusate sodium (COLACE) capsule 100 mg (has no administration in time range)  polyethylene glycol (MIRALAX / GLYCOLAX) packet 17 g (has no administration in time range)  enoxaparin (LOVENOX) injection 40 mg (has no administration in time range)  diphenhydrAMINE (BENADRYL) injection 25 mg (has no administration in time range)  famotidine (PEPCID) IVPB 20 mg premix (has no administration in time range)  methylPREDNISolone sodium succinate (SOLU-MEDROL) 40 mg/mL injection 40 mg (has no administration in time range)  insulin aspart (novoLOG) injection 0-15 Units (has no administration in time range)  insulin aspart (novoLOG) injection 0-5 Units (has no administration in time range)    Mobility walks

## 2023-01-04 NOTE — Progress Notes (Addendum)
Symptom Management Consult Note [facility]    Patient Care Team: Marletta Lor, NP as PCP - General (Nurse Practitioner)    Name / MRN / DOB: Misty Deleon  [id number]  15-Dec-1950   Date of visit: 01/04/2023   Chief Complaint/Reason for visit: tongue swelling   Current Therapy: bevacizumab-awwb, carboplatin, paclitaxel, Rande Lawman  Last treatment:  Day 1   Cycle 5 on 12/14/22   ASSESSMENT & PLAN: Patient is a 72 y.o. female  with oncologic history of cervical cancer followed by Dr. Bertis Ruddy.  I have viewed most recent oncology note and lab work.   #Angioedema -Patient with significant angioedema on arrival to [facility], impressive tongue and left sided neck swelling. Muffled voice, handling secretions, clear lung exam.  - Medication list shows lisinopril, suspect this to be the cause. -She was given 125 mg IV Solu-medrol at 0917, 20 mg IV pepcid at 0918 50 mg IV Benadryl at 920. Patient started drooling, IM epi administered at 0925. - Patient taken to the ED, ED MD in resus bay on arrival. Report given.   #Cervical cancer - Treatment today will be canceled.  - Next appointment with oncologist is 01/25/23. - Oncologist made aware of events.     Heme/Onc History: Oncology History Overview Note  Adenocarcinoma, ER/PR positive MMR normal PD-L1 CPS 30%   Cervical cancer  08/13/2020 Pathology Results   Cervical biopsy showed poorly differentiated carcinoma. 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   08/27/2020 Imaging   MRI pelvis Large cervical mass with pelvic adenopathy, at least T2B N1 disease. Question of involvement of posterior urinary bladder (T4) for which additional images have been requested. Patient will return for additional images to answer this question. (Thinner section axial T2 without fat sat and sagittal T2 weighted imaging also without fat saturation.)   Small field-of-view images show that the  parametrial extension comes in very close proximity to the RIGHT ureter.   08/30/2020 Initial Diagnosis   Cervical cancer (HCC)   08/30/2020 Cancer Staging   Staging form: Cervix Uteri, AJCC Version 9 - Clinical stage from 08/30/2020: FIGO Stage IVB (rcT2, cN1, pM1) - Signed by Artis Delay, MD on 09/01/2022 Stage prefix: Recurrence   09/01/2020 PET scan   1. Large cervical mass extending into the endometrial canal to the level of the uterine fundus, associated with pelvic adenopathy to the level of the RIGHT common iliac chain. Please refer to MRI for further detail. 2. No signs of solid organ uptake or metastatic disease to the chest. 3. Subcutaneous nodule with marked increased metabolic activity, correlation with direct clinical inspection is suggested to exclude cutaneous neoplasm or subcutaneous nodule in this location. Complicated sebaceous cyst could also potentially have this appearance. 4. Uptake in the anal canal could likely physiologic. Given the intense nature of the uptake would suggest correlation with direct clinical inspection/examination. 5. Uptake in axillary lymph nodes with activity less than mediastinal blood pool likely small reactive lymph nodes, attention on follow-up. Morphologic features also with benign appearance.   09/16/2020 Procedure   Successful placement of a power injectable Port-A-Cath via the right internal jugular vein. The catheter is ready for immediate use.     09/20/2020 - 10/25/2020 Chemotherapy   She received cisplatin chemo with concurrent radiation       09/22/2020 - 12/08/2020 Radiation Therapy   Radiation Treatment Dates: 09/22/2020 through 12/08/2020   Site: Pelvis Technique: IMRT Total Dose (Gy): 45/45 Dose per Fx (  Gy): 1.8 Completed Fx: 25/25 Beam Energies: 6x   Site: pelvic boost Technique: IMRT Total Dose (Gy): 7.2/7.2 Dose per Fx (Gy): 1.8 Completed Fx: 4/4 Beam Energies: 6x   Site: Cervix boost Technique:  HDR-brachytherapy Total Dose (Gy): 27.5/27.5 Dose per Fx (Gy): 5.5 Completed Fx: 5/5 Beam Energies: Ir-192     03/17/2021 PET scan   1. Mark positive response to therapy. 2. Resolution of metabolic activity within the uterine body. 3. Resolution of size and metabolic activity of pelvic lymph nodes. 4. No evidence of new disease outside of the pelvis.   08/10/2022 PET scan   1. New hypermetabolic bilateral pulmonary nodules and hypermetabolic cervical and hilar lymph nodes, consistent with metastatic disease. 2. Hypermetabolic focus in the anterior right third costochondral junction without discrete CT correlate is also compatible with metastatic disease. 3. Prior hysterectomy without suspicious hypermetabolic nodularity along the vaginal cuff. 4.  Aortic Atherosclerosis (ICD10-I70.0).   08/24/2022 Pathology Results   A. SOFT TISSUE NODULE, RIGHT CHEST WALL, NEEDLE CORE BIOPSY:  Poorly differentiated carcinoma.  See comment.   COMMENT:  The carcinoma is characterized by nest of malignant epithelioid cells which focally has features suggestive of squamous differentiation morphologically.  Immunohistochemistry is positive with cytokeratin 7, PAX8, MOC-31 and estrogen receptor.  There is patchy positivity with p16, progesterone receptor and TTF-1.  The tumor is negative with p40, cytokeratin 5/6, p63, CDX2, cytokeratin 20 and WT-1.  The immunophenotype is consistent with adenocarcinoma and suggestive of a gynecologic primary.    09/08/2022 -  Chemotherapy   Patient is on Treatment Plan : OVARIAN Carboplatin + Paclitaxel + Bevacizumab q21d      11/07/2022 Imaging   1. Decreased size of the pulmonary nodules and mediastinal adenopathy, consistent with treatment response. 2. Similar appearance of the previously hypermetabolic soft tissue subjacent to the right anterior third costochondral cartilage. 3. No convincing evidence of new or progressive disease in the chest, abdomen or pelvis. 4.  Rectal wall thickening with stranding in the mesorectal fat and diffuse thickening of the urinary bladder wall, nonspecific but possibly reflecting post radiation change. 5. Nonobstructive right renal stones measure up to 6 mm in the renal pelvis. 6. Colonic diverticulosis without findings of acute diverticulitis. 7.  Aortic Atherosclerosis (ICD10-I70.0).   Malignant neoplasm of cervix  09/01/2020 Initial Diagnosis   Malignant neoplasm of cervix (HCC)   09/20/2020 - 10/25/2020 Chemotherapy   She received cisplatin chemo with concurrent radiation       09/22/2020 - 12/08/2020 Radiation Therapy   Radiation Treatment Dates: 09/22/2020 through 12/08/2020   Site: Pelvis Technique: IMRT Total Dose (Gy): 45/45 Dose per Fx (Gy): 1.8 Completed Fx: 25/25 Beam Energies: 6x   Site: pelvic boost Technique: IMRT Total Dose (Gy): 7.2/7.2 Dose per Fx (Gy): 1.8 Completed Fx: 4/4 Beam Energies: 6x   Site: Cervix boost Technique: HDR-brachytherapy Total Dose (Gy): 27.5/27.5 Dose per Fx (Gy): 5.5 Completed Fx: 5/5 Beam Energies: Ir-192   09/08/2022 -  Chemotherapy   Patient is on Treatment Plan : OVARIAN Carboplatin + Paclitaxel + Bevacizumab q21d          Interval history-: Misty Deleon is a 72 y.o. female with oncologic history as above presenting to [facility] today with chief complaint of tongue swelling.  Her spouse accompanies her and provides additional history.  Patient states she woke up at 4 AM this morning and noticed that half of her tongue was swollen.  The swelling progressed throughout the morning.  She tried to take a Benadryl  although was unsure how much she actually swallowed.  She denies any new medications.  She has no food allergies.  She took her Decadron pills last night as she always does before treatment.  Over the last hour the left side of her neck started to swell as well.  She denies history of similar symptoms. She denies wheezing, nausea.       ROS  All other  systems are reviewed and are negative for acute change except as noted in the HPI.    No Known Allergies   Past Medical History:  Diagnosis Date   CKD (chronic kidney disease), stage III    Deficiency anemia    B12 def.  treated with b12 injection and has received iron infusion's   GERD (gastroesophageal reflux disease)    History of radiation therapy 09/22/20-11/01/20   Cervix, IMRT    Dr. Antony Blackbird   History of radiation therapy 11/09/2020-12/08/2020   cervix, intracavitary brachytherapy   Dr Antony Blackbird   Hypercholesteremia    Hypertension    Hypomagnesemia    Malignant neoplasm cervix oncologist--- dr gorsuch/  radiation oncology--- dr Roselind Messier   dx 12/ 2021,  Stage IIIC-1,  with pelvic adeopathy;  concurrent chemo/ radiation;  chemo started 09-20-2020 and pt completed IMRT 11-01-2020 / scheduled for high dose brachytherapy to start 11-09-2020   PCOS (polycystic ovarian syndrome)    PMB (postmenopausal bleeding)    Type 2 diabetes mellitus    followed by pc   (11-04-2020 pt stated does not check blood sugar)   Wears contact lenses      Past Surgical History:  Procedure Laterality Date   CESAREAN SECTION  x2 last one 1977   IR IMAGING GUIDED PORT INSERTION  09/16/2020   OPERATIVE ULTRASOUND N/A 11/09/2020   Procedure: OPERATIVE ULTRASOUND;  Surgeon: Antony Blackbird, MD;  Location: [facility];  Service: Urology;  Laterality: N/A;   OPERATIVE ULTRASOUND N/A 11/16/2020   Procedure: OPERATIVE ULTRASOUND;  Surgeon: Antony Blackbird, MD;  Location: [facility];  Service: Urology;  Laterality: N/A;   OPERATIVE ULTRASOUND N/A 11/25/2020   Procedure: OPERATIVE ULTRASOUND;  Surgeon: Antony Blackbird, MD;  Location: [facility];  Service: Urology;  Laterality: N/A;   OPERATIVE ULTRASOUND N/A 11/29/2020   Procedure: OPERATIVE ULTRASOUND;  Surgeon: Antony Blackbird, MD;  Location: [facility];  Service: Urology;  Laterality: N/A;   OPERATIVE  ULTRASOUND N/A 12/08/2020   Procedure: OPERATIVE ULTRASOUND;  Surgeon: Antony Blackbird, MD;  Location[facility];  Service: Urology;  Laterality: N/A;   TANDEM RING INSERTION N/A 11/09/2020   Procedure: TANDEM RING INSERTION;  Surgeon: Antony Blackbird, MD;  Location: [facility];  Service: Urology;  Laterality: N/A;   TANDEM RING INSERTION N/A 11/16/2020   Procedure: TANDEM RING INSERTION;  Surgeon: Antony Blackbird, MD;  Location: [facility];  Service: Urology;  Laterality: N/A;   TANDEM RING INSERTION N/A 11/25/2020   Procedure: TANDEM RING INSERTION;  Surgeon: Antony Blackbird, MD;  Location: [facility];  Service: Urology;  Laterality: N/A;   TANDEM RING INSERTION N/A 11/29/2020   Procedure: TANDEM RING INSERTION;  Surgeon: Antony Blackbird, MD;  Location: [facility];  Service: Urology;  Laterality: N/A;   TANDEM RING INSERTION N/A 12/08/2020   Procedure: TANDEM RING INSERTION;  Surgeon: Antony Blackbird, MD;  Location: [facility];  Service: Urology;  Laterality: N/A;   WISDOM TOOTH EXTRACTION  1980s  Social History   Socioeconomic History   Marital status: Married    Spouse name: Not on file   Number of children: 2   Years of education: Not on file   Highest education level: Not on file  Occupational History   Occupation: retired Education officer, [employer]  Tobacco Use   Smoking status: Never   Smokeless tobacco: Never  Vaping Use   Vaping Use: Never used  Substance and Sexual Activity   Alcohol use: Never   Drug use: Never   Sexual activity: Not on file  Other Topics Concern   Not on file  Social History Narrative   Not on file   Social Determinants of Health   Financial Resource Strain: Not on file  Food Insecurity: Not on file  Transportation Needs: Not on file  Physical Activity: Not on file  Stress: Not on file  Social Connections: Not on file  Intimate Partner Violence: Not on file     Family History  Problem Relation Age of Onset   Diabetes Father    Brain cancer Son    Breast cancer Neg Hx    Colon cancer Neg Hx    Ovarian cancer Neg Hx    Endometrial cancer Neg Hx    Prostate cancer Neg Hx    Pancreatic cancer Neg Hx     No current facility-administered medications for this visit.  Current Outpatient Medications:    atenolol (TENORMIN) 25 MG tablet, Take 1 tablet (25 mg total) by mouth daily. Take in the morning, Disp: 30 tablet, Rfl: 1   calcium carbonate (TUMS - DOSED IN MG ELEMENTAL CALCIUM) 500 MG chewable tablet, Chew 1 tablet by mouth daily as needed for indigestion or heartburn., Disp: , Rfl:    dexamethasone (DECADRON) 4 MG tablet, Take 2 tabs at the night before chemotherapy, every 3 weeks, by mouth x 6 cycles, Disp: 36 tablet, Rfl: 6   ezetimibe (ZETIA) 10 MG tablet, Take 10 mg by mouth in the morning., Disp: , Rfl:    glimepiride (AMARYL) 1 MG tablet, Take 1 mg by mouth 2 (two) times daily., Disp: , Rfl:    lidocaine-prilocaine (EMLA) cream, Apply to affected area once, Disp: 30 g, Rfl: 3   lisinopril (ZESTRIL) 10 MG tablet, Take 1 tablet (10 mg total) by mouth daily. Take in the evening, Disp: 30 tablet, Rfl: 1   magnesium oxide (MAG-OX) 400 (241.3 Mg) MG tablet, Take 1 tablet (400 mg total) by mouth 2 (two) times daily., Disp: 60 tablet, Rfl: 11   ondansetron (ZOFRAN) 8 MG tablet, Take 1 tablet (8 mg total) by mouth every 8 (eight) hours as needed for nausea or vomiting. Start on the third day after chemotherapy., Disp: 30 tablet, Rfl: 1   prochlorperazine (COMPAZINE) 10 MG tablet, Take 1 tablet (10 mg total) by mouth every 6 (six) hours as needed for nausea or vomiting., Disp: 30 tablet, Rfl: 1   rosuvastatin (CRESTOR) 40 MG tablet, Take 40 mg by mouth at bedtime., Disp: , Rfl:    sitaGLIPtin (JANUVIA) 100 MG tablet, Take 50 mg by mouth in the morning., Disp: , Rfl:    traMADol (ULTRAM) 50 MG tablet, Take 1 tablet (50 mg total) by mouth every 6  (six) hours as needed., Disp: 30 tablet, Rfl: 0   vitamin B-12 (CYANOCOBALAMIN) 1000 MCG tablet, Take 1,000 mcg by mouth in the morning., Disp: , Rfl:   Facility-Administered Medications Ordered in Other Visits:    0.9 %  sodium chloride infusion, ,  Intravenous, Continuous, Jacalyn Lefevre, MD  PHYSICAL EXAM: ECOG FS:1 - Symptomatic but completely ambulatory    Vitals:   01/04/23 0913  BP: (!) 183/82  Pulse: 97  Resp: 19  SpO2: 100%   Physical Exam Vitals and nursing note reviewed.  Constitutional:      Appearance: She is not ill-appearing or toxic-appearing.  HENT:     Head: Normocephalic.     Mouth/Throat:     Mouth: Angioedema present.     Comments: Muffled voice. Eyes:     Conjunctiva/sclera: Conjunctivae normal.  Neck:     Comments: Left sided neck swelling Cardiovascular:     Rate and Rhythm: Normal rate and regular rhythm.     Pulses: Normal pulses.     Heart sounds: Normal heart sounds.  Pulmonary:     Effort: Pulmonary effort is normal. No respiratory distress.     Breath sounds: Normal breath sounds. No stridor. No wheezing.  Abdominal:     General: There is no distension.  Musculoskeletal:     Cervical back: Normal range of motion.  Skin:    General: Skin is warm and dry.  Neurological:     Mental Status: She is alert.        LABORATORY DATA: I have reviewed the data as listed    Latest Ref Rng & Units 12/14/2022    7:47 AM 11/09/2022    7:41 AM 10/19/2022    7:49 AM  CBC  WBC 4.0 - 10.5 K/uL 2.3  3.6  5.2   Hemoglobin 12.0 - 15.0 g/dL 9.6  9.8  95.6   Hematocrit 36.0 - 46.0 % 29.0  28.1  30.6   Platelets 150 - 400 K/uL 240  99  140         Latest Ref Rng & Units 12/14/2022    7:47 AM 11/09/2022    7:41 AM 10/19/2022    7:49 AM  CMP  Glucose 70 - 99 mg/dL 213  086  578   BUN 8 - 23 mg/dL Creatinine 0.44 - 1.00 mg/dL 4.69  6.29  5.28   Sodium 135 - 145 mmol/L 136  138  136   Potassium 3.5 - 5.1 mmol/L 4.7  4.3  4.1   Chloride 98  - 111 mmol/L 103  102  100   CO2 22 - 32 mmol/L Calcium 8.9 - 10.3 mg/dL 41.3  9.2  9.8   Total Protein 6.5 - 8.1 g/dL 7.4  6.6  6.9   Total Bilirubin 0.3 - 1.2 mg/dL 0.6  0.8  0.7   Alkaline Phos 38 - 126 U/L 48  61  57   AST 15 - 41 U/L ALT 0 - 44 U/L RADIOGRAPHIC STUDIES (from last 24 hours if applicable) I have personally reviewed the radiological images as listed and agreed with the findings in the report. No results found.      Visit Diagnosis: 1. Angioedema, initial encounter   2. Malignant neoplasm of overlapping sites of cervix      No orders of the defined types were placed in this encounter.   All questions were answered. The patient knows to call the clinic with any problems, questions or concerns. No barriers to learning was detected.  I have spent a total of 30 minutes minutes of face-to-face and non-face-to-face  time, preparing to see the patient, obtaining and/or reviewing separately obtained history, performing a medically appropriate examination, counseling and educating the patient, ordering medications, documenting clinical information in the electronic health record, and care coordination (communications with other health care professionals or caregivers).    Thank you for allowing me to participate in the care of this patient.    Shanon Ace, PA-C Department of Hematology/Oncology [facility] at [facility] Phone: [phone]  [phone]    01/04/2023 9:50 AM

## 2023-01-04 NOTE — H&P (Signed)
NAME:  Misty Deleon, MRN:  [id number], DOB:  02/26/1951, LOS: 0 ADMISSION DATE:  01/04/2023, CONSULTATION DATE:  4/25 REFERRING MD:  Particia Nearing, CHIEF COMPLAINT:  angioedema    History of Present Illness:  39 yof f/b the cancer center for cervical cancer w/ mets to lung (see below). Presents from the cancer center on 4/25 w/ cc: tongue swelling. Reports woke up about 4 am noticed 1/2 tongue swollen. This progressed t/o the morning. Was associated w/ difficulty swallowing and muffled voice change. In the clinic she was treated for possible hypersensitivity reaction (benadryl, 1 liter NS, pepcid, solumedrol  IV, epipen and subsequently referred to the ER.  On arrival to ER swelling had improved.  Able to close top and bottom of her teeth now, Voice improved work of breathing not present.  upper airway assessment no wheezing or stridor. Tongue midline. Still has some swelling sensation under the tongue but swelling has greatly improved . 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   Interim History / Subjective:  Feels better Objective   Blood pressure (Abnormal) 175/105, pulse 88, temperature (Abnormal) 96.2 F (35.7 C), temperature source Axillary, resp. rate (Abnormal) 21, height  (1.575 m), weight 91.6 kg, SpO2 100 %.       No intake or output data in the 24 hours ending 01/04/23 1135 Filed Weights   01/04/23 1014   Weight: 91.6 kg    Examination: General: no acute distress  HENT: NCAT no JVD MMM no stridor OR wheezing. Able to close teeth shut (before only mouth), still some mild swelling of the tough. She is having no SOB and feels like swallowing better  Lungs: clear  Cardiovascular: RRR Abdomen: soft  Extremities: warm and dry  Neuro: awake and oriented  GU: dtv  Resolved Hospital Problem list     Assessment & Plan:  Angioedema presume 2/2 ace I. Doubt hereditary so no role for C1 inhibitor or TXA or plasma  Plan Admit to ICU for airway monitoring  Dc ace I  Cont scheduled systemic steroids & H2B Humidified oxygen  Difficult airway cart to bedside   H/o metastatic cervical cancer Plan F/U w/ onc  Hyperglycemia (steroid induced)  Plan  CKD stage IIIa Plan  Mild thrombocytopenia  Plan Monitor  Best Practice (right click and "Reselect all SmartList Selections" daily)   Diet/type: clear liquids DVT prophylaxis: LMWH GI prophylaxis: H2B Lines: Central line Foley:  N/A Code Status:  full code Last date of multidisciplinary goals of care discussion [pending ]  Labs   CBC: Recent Labs  Lab 01/04/23 0927  WBC 4.7  HGB 8.9*  HCT 26.3*  MCV 104.8*  PLT 67*    Basic Metabolic Panel: Recent Labs  Lab 01/04/23 0927  NA 135  K 3.8  CL 105  CO2 19*  GLUCOSE 278*  BUN 27*  CREATININE 1.08*  CALCIUM 8.5*   GFR: Estimated Creatinine Clearance:  51 mL/min (A) (by C-G formula based on SCr of 1.08 mg/dL (H)). Recent Labs  Lab 01/04/23 0927  WBC 4.7    Liver Function Tests: No results for input(s): "AST", "ALT", "ALKPHOS", "BILITOT", "PROT", "ALBUMIN" in the last 168 hours. No results for input(s): "LIPASE", "AMYLASE" in the last 168 hours. No results for input(s): "AMMONIA" in the last 168 hours.  ABG    Component Value Date/Time   TCO2 24 12/08/2020 0636     Coagulation Profile: No results for input(s): "INR", "PROTIME" in the last 168  hours.  Cardiac Enzymes: No results for input(s): "CKTOTAL", "CKMB", "CKMBINDEX", "TROPONINI" in the last 168 hours.  HbA1C: No results found for: "HGBA1C"  CBG: No results for input(s): "GLUCAP" in the last 168 hours.  Review of Systems:   Review of Systems  Constitutional:  Negative for diaphoresis, fever and malaise/fatigue.  HENT:  Positive for congestion.        Tongue was swollen  Able to swallow if pushed pill past tongue No current wheezing or stridor No SOB  No CP   Eyes: Negative.   Respiratory: Negative.    Cardiovascular: Negative.   Gastrointestinal: Negative.   Genitourinary: Negative.   Musculoskeletal: Negative.   Skin: Negative.   Neurological: Negative.   Endo/Heme/Allergies: Negative.      Past Medical History:  She,  has a past medical history of CKD (chronic kidney disease), stage III, Deficiency anemia, GERD (gastroesophageal reflux disease), History of radiation therapy (09/22/20-11/01/20), History of radiation therapy (11/09/2020-12/08/2020), Hypercholesteremia, Hypertension, Hypomagnesemia, Malignant neoplasm cervix (oncologist--- dr gorsuch/  radiation oncology--- dr Roselind Messier), PCOS (polycystic ovarian syndrome), PMB (postmenopausal bleeding), Type 2 diabetes mellitus, and Wears contact lenses.   Surgical History:   Past Surgical History:  Procedure Laterality Date   CESAREAN SECTION  x2 last one 1977   IR IMAGING GUIDED PORT INSERTION  09/16/2020   OPERATIVE ULTRASOUND N/A 11/09/2020   Procedure: OPERATIVE ULTRASOUND;  Surgeon: Antony Blackbird, MD;  Location: [facility];  Service: Urology;  Laterality: N/A;   OPERATIVE ULTRASOUND N/A 11/16/2020   Procedure: OPERATIVE ULTRASOUND;  Surgeon: Antony Blackbird, MD;  Location: [facility];  Service: Urology;  Laterality: N/A;   OPERATIVE ULTRASOUND N/A 11/25/2020   Procedure: OPERATIVE ULTRASOUND;  Surgeon: Antony Blackbird, MD;  Location: [facility];  Service: Urology;   Laterality: N/A;   OPERATIVE ULTRASOUND N/A 11/29/2020   Procedure: OPERATIVE ULTRASOUND;  Surgeon: Antony Blackbird, MD;  Location: [facility];  Service: Urology;  Laterality: N/A;   OPERATIVE ULTRASOUND N/A 12/08/2020   Procedure: OPERATIVE ULTRASOUND;  Surgeon: Antony Blackbird, MD;  Location: [facility];  Service: Urology;  Laterality: N/A;   TANDEM RING INSERTION N/A 11/09/2020   Procedure: TANDEM RING INSERTION;  Surgeon: Antony Blackbird, MD;  Location: [facility];  Service: Urology;  Laterality: N/A;   TANDEM RING INSERTION N/A 11/16/2020   Procedure: TANDEM RING INSERTION;  Surgeon: Antony Blackbird, MD;  Location: [facility];  Service: Urology;  Laterality: N/A;   TANDEM RING INSERTION N/A 11/25/2020   Procedure: TANDEM RING INSERTION;  Surgeon: Antony Blackbird, MD;  Location: [facility];  Service: Urology;  Laterality: N/A;   TANDEM RING INSERTION N/A 11/29/2020   Procedure: TANDEM RING INSERTION;  Surgeon: Antony Blackbird, MD;  Location: [facility];  Service: Urology;  Laterality: N/A;   TANDEM RING INSERTION N/A 12/08/2020   Procedure: TANDEM RING INSERTION;  Surgeon: Antony Blackbird, MD;  Location: [facility];  Service: Urology;  Laterality: N/A;   WISDOM TOOTH EXTRACTION  1980s     Social History:   reports that she has never smoked. She has never used smokeless tobacco. She reports that she does not drink alcohol and does not use drugs.   Family History:  Her family history includes Brain cancer in her son; Diabetes in her father. There is no history of Breast cancer, Colon cancer, Ovarian cancer, Endometrial cancer, Prostate cancer, or Pancreatic cancer.   Allergies No Known Allergies   Home Medications  Prior to Admission medications   Medication Sig Start Date End Date Taking? Authorizing Provider  atenolol (TENORMIN) 25 MG tablet Take 1 tablet (25 mg total) by mouth daily. Take in  the morning 11/09/22   Artis Delay, MD  calcium carbonate (TUMS - DOSED IN MG ELEMENTAL CALCIUM) 500 MG chewable tablet Chew 1 tablet by mouth daily as needed for indigestion or heartburn.    [provider]  dexamethasone (DECADRON) 4 MG tablet Take 2 tabs at the night before chemotherapy, every 3 weeks, by mouth x 6 cycles 10/19/22   Artis Delay, MD  ezetimibe (ZETIA) 10 MG tablet Take 10 mg by mouth in the morning.    [provider]  glimepiride (AMARYL) 1 MG tablet Take 1 mg by mouth 2 (two) times daily. 07/07/22   [provider]  lidocaine-prilocaine (EMLA) cream Apply to affected area once 09/01/22   Artis Delay, MD  lisinopril (ZESTRIL) 10 MG tablet Take 1 tablet (10 mg total) by mouth daily. Take in the evening 11/09/22   Artis Delay, MD  magnesium oxide (MAG-OX) 400 (241.3 Mg) MG tablet Take 1 tablet (400 mg total) by mouth 2 (two) times daily. 10/01/20   Artis Delay, MD  ondansetron (ZOFRAN) 8 MG tablet Take 1 tablet (8 mg total) by mouth every 8 (eight) hours as needed for nausea or vomiting. Start on the third day after chemotherapy. 09/01/22   Artis Delay, MD  prochlorperazine (COMPAZINE) 10 MG tablet Take 1 tablet (10 mg total) by mouth every 6 (six) hours as needed for nausea or vomiting. 09/01/22   Artis Delay, MD  rosuvastatin (CRESTOR) 40 MG tablet Take 40 mg by mouth at bedtime.    [provider]  sitaGLIPtin (JANUVIA) 100 MG tablet Take 50 mg by mouth in the morning. 03/30/22   [provider]  traMADol (ULTRAM) 50 MG tablet Take 1 tablet (50 mg total) by mouth every 6 (six) hours as needed. 09/28/22   Artis Delay, MD  vitamin B-12 (CYANOCOBALAMIN) 1000 MCG tablet Take 1,000 mcg by mouth in the morning.    [provider]     Critical care time: 32 min     Simonne Martinet ACNP-BC [facility] Pulmonary/Critical Care Pager # [phone] OR # [phone] if no answer

## 2023-01-04 NOTE — ED Provider Notes (Signed)
[place] EMERGENCY DEPARTMENT AT [facility] Provider Note   CSN: [id number] Arrival date & time: 01/04/23  7829     History  Chief Complaint  Patient presents with   Oral Swelling    KWANZA CANCELLIERE is a 72 y.o. female.  Pt is a 72 yo female with pmhx significant for HTN, HLD, PCOS, CKD, GERD, cervical cancer on chemo, pancytopenia, and DM.  Pt woke up this am around 0400 with her tongue swollen.  She took a benadryl around 0700.  She came to the cancer center for her treatment.  The cancer center recognized that she had severe angioedema, so she was given: Normal Saline 1 L IV, Diphenhydramine (Benadryl) 50 mg IV, Famotidine (Pepcid) 20 mg IV, and Methylprednisolone (Solu-Medrol) 125 mg IV and brought immediately down here.         Home Medications Prior to Admission medications   Medication Sig Start Date End Date Taking? Authorizing Provider  atenolol (TENORMIN) 25 MG tablet Take 1 tablet (25 mg total) by mouth daily. Take in the morning 11/09/22   Artis Delay, MD  calcium carbonate (TUMS - DOSED IN MG ELEMENTAL CALCIUM) 500 MG chewable tablet Chew 1 tablet by mouth daily as needed for indigestion or heartburn.    [provider]  dexamethasone (DECADRON) 4 MG tablet Take 2 tabs at the night before chemotherapy, every 3 weeks, by mouth x 6 cycles 10/19/22   Artis Delay, MD  ezetimibe (ZETIA) 10 MG tablet Take 10 mg by mouth in the morning.    [provider]  glimepiride (AMARYL) 1 MG tablet Take 1 mg by mouth 2 (two) times daily. 07/07/22   [provider]  lidocaine-prilocaine (EMLA) cream Apply to affected area once 09/01/22   Artis Delay, MD  lisinopril (ZESTRIL) 10 MG tablet Take 1 tablet (10 mg total) by mouth daily. Take in the evening 11/09/22   Artis Delay, MD  magnesium oxide (MAG-OX) 400 (241.3 Mg) MG tablet Take 1 tablet (400 mg total) by mouth 2 (two) times daily. 10/01/20   Artis Delay, MD  ondansetron (ZOFRAN) 8 MG tablet Take  1 tablet (8 mg total) by mouth every 8 (eight) hours as needed for nausea or vomiting. Start on the third day after chemotherapy. 09/01/22   Artis Delay, MD  prochlorperazine (COMPAZINE) 10 MG tablet Take 1 tablet (10 mg total) by mouth every 6 (six) hours as needed for nausea or vomiting. 09/01/22   Artis Delay, MD  rosuvastatin (CRESTOR) 40 MG tablet Take 40 mg by mouth at bedtime.    [provider]  sitaGLIPtin (JANUVIA) 100 MG tablet Take 50 mg by mouth in the morning. 03/30/22   [provider]  traMADol (ULTRAM) 50 MG tablet Take 1 tablet (50 mg total) by mouth every 6 (six) hours as needed. 09/28/22   Artis Delay, MD  vitamin B-12 (CYANOCOBALAMIN) 1000 MCG tablet Take 1,000 mcg by mouth in the morning.    [provider]      Allergies    Patient has no known allergies.    Review of Systems   Review of Systems  HENT:         Tongue swelling  All other systems reviewed and are negative.   Physical Exam Updated Vital Signs BP (!) 175/105 (BP Location: Left Arm)   Pulse 86   Temp (!) 96.2 F (35.7 C) (Axillary)   Resp 17   Ht  (1.575 m)   Wt 91.6 kg  SpO2 97%   BMI 36.95 kg/m  Physical Exam Vitals and nursing note reviewed.  Constitutional:      General: She is in acute distress.     Appearance: She is obese. She is ill-appearing.  HENT:     Head: Normocephalic and atraumatic.     Right Ear: External ear normal.     Left Ear: External ear normal.     Nose: Nose normal.     Mouth/Throat:     Mouth: Mucous membranes are dry.     Comments: Enlarged tongue, swelling to anterior neck Eyes:     Extraocular Movements: Extraocular movements intact.     Conjunctiva/sclera: Conjunctivae normal.     Pupils: Pupils are equal, round, and reactive to light.  Cardiovascular:     Rate and Rhythm: Normal rate and regular rhythm.     Pulses: Normal pulses.     Heart sounds: Normal heart sounds.  Pulmonary:     Effort: Pulmonary effort is normal.      Breath sounds: Normal breath sounds.  Abdominal:     General: Abdomen is flat. Bowel sounds are normal.     Palpations: Abdomen is soft.  Musculoskeletal:        General: Normal range of motion.     Cervical back: Normal range of motion and neck supple.  Skin:    General: Skin is warm.     Capillary Refill: Capillary refill takes less than 2 seconds.  Neurological:     General: No focal deficit present.     Mental Status: She is alert and oriented to person, place, and time.  Psychiatric:        Mood and Affect: Mood normal.        Behavior: Behavior normal.     ED Results / Procedures / Treatments   Labs (all labs ordered are listed, but only abnormal results are displayed) Labs Reviewed  CBC - Abnormal; Notable for the following components:      Result Value   RBC 2.51 (*)    Hemoglobin 8.9 (*)    HCT 26.3 (*)    MCV 104.8 (*)    MCH 35.5 (*)    RDW 18.6 (*)    Platelets 67 (*)    All other components within normal limits  BASIC METABOLIC PANEL - Abnormal; Notable for the following components:   CO2 19 (*)    Glucose, Bld 278 (*)    BUN 27 (*)    Creatinine, Ser 1.08 (*)    Calcium 8.5 (*)    GFR, Estimated 55 (*)    All other components within normal limits  HIV ANTIBODY (ROUTINE TESTING W REFLEX)  CBC  CREATININE, SERUM  HEMOGLOBIN A1C    EKG EKG Interpretation  Date/Time:  Thursday January 04 2023 09:35:00 EDT Ventricular Rate:  91 PR Interval:  150 QRS Duration: 69 QT Interval:  386 QTC Calculation: 475 R Axis:   52 Text Interpretation: Sinus rhythm Ventricular premature complex Low voltage, precordial leads Minimal ST depression, diffuse leads PVCs are new Confirmed by Jacalyn Lefevre [phone]) on 01/04/2023 10:12:13 AM  Radiology No results found.  Procedures Procedures    Medications Ordered in ED Medications  0.9 %  sodium chloride infusion ( Intravenous New Bag/Given 01/04/23 1009)  traMADol (ULTRAM) tablet 50 mg (has no administration in  time range)  docusate sodium (COLACE) capsule 100 mg (has no administration in time range)  polyethylene glycol (MIRALAX / GLYCOLAX) packet 17 g (has no administration  in time range)  enoxaparin (LOVENOX) injection 40 mg (has no administration in time range)  diphenhydrAMINE (BENADRYL) injection 25 mg (has no administration in time range)  famotidine (PEPCID) IVPB 20 mg premix (has no administration in time range)  methylPREDNISolone sodium succinate (SOLU-MEDROL) 40 mg/mL injection 40 mg (has no administration in time range)  insulin aspart (novoLOG) injection 0-15 Units (has no administration in time range)  insulin aspart (novoLOG) injection 0-5 Units (has no administration in time range)    ED Course/ Medical Decision Making/ A&P                             Medical Decision Making Amount and/or Complexity of Data Reviewed Labs: ordered.  Risk Prescription drug management. Decision regarding hospitalization.   This patient presents to the ED for concern of angioedema, this involves an extensive number of treatment options, and is a complaint that carries with it a high risk of complications and morbidity.  The differential diagnosis includes angioedema   Co morbidities that complicate the patient evaluation  HTN, HLD, PCOS, CKD, GERD, cervical cancer on chemo, pancytopenia, and DM   Additional history obtained:  Additional history obtained from epic chart review External records from outside source obtained and reviewed including cancer center PA Daphane Shepherd, husband   Lab Tests:  I Ordered, and personally interpreted labs.  The pertinent results include:  cbc with hgb low at 8.9 and plt low at 67; bmp with glucose elevated at 278, butn 27 and cr 1.08   Cardiac Monitoring:  The patient was maintained on a cardiac monitor.  I personally viewed and interpreted the cardiac monitored which showed an underlying rhythm of: nsr   Medicines ordered and prescription  drug management:  I ordered medication including epi  for sx  Reevaluation of the patient after these medicines showed that the patient improved I have reviewed the patients home medicines and have made adjustments as needed   Critical Interventions:  epi   Consultations Obtained:  I requested consultation with the intensivist (Dr. Judeth Horn),  and discussed lab and imaging findings as well as pertinent plan -he will admit   Problem List / ED Course:  Angioedema:  likely due to lisinopril.  Swelling has improved some.  She is able to touch her top and bottom teeth now.  She is able to talk, but it's hoarse.  However, she still has a lot of swelling.   Reevaluation:  After the interventions noted above, I reevaluated the patient and found that they have :improved   Social Determinants of Health:  Lives at home   Dispostion:  After consideration of the diagnostic results and the patients response to treatment, I feel that the patent would benefit from admission.    CRITICAL CARE Performed by: Jacalyn Lefevre   Total critical care time: 30 minutes  Critical care time was exclusive of separately billable procedures and treating other patients.  Critical care was necessary to treat or prevent imminent or life-threatening deterioration.  Critical care was time spent personally by me on the following activities: development of treatment plan with patient and/or surrogate as well as nursing, discussions with consultants, evaluation of patient's response to treatment, examination of patient, obtaining history from patient or surrogate, ordering and performing treatments and interventions, ordering and review of laboratory studies, ordering and review of radiographic studies, pulse oximetry and re-evaluation of patient's condition.         Final Clinical  Impression(s) / ED Diagnoses Final diagnoses:  Angioedema, initial encounter    Rx / DC Orders ED Discharge Orders      None         Jacalyn Lefevre, MD 01/04/23 1248

## 2023-01-04 NOTE — ED Notes (Signed)
Pt ambulated to restroom without staff assistance.

## 2023-01-04 NOTE — Progress Notes (Signed)
Hypersensitivity Reaction Note  Date of event: 01/04/23  Time of event: 0900   Type of event: Grade 3 (Prolonged reaction; recurrence of symptoms following initial improvement; hospitalization indicated for other clinical presentations)   Generic name of drug involved: Suspected lisinopril reaction  Initial Presentation and Response:  The patient reported and showed signs of Angioedema, difficulty speaking  Provider notified of the hypersensitivity reaction: Daphane Shepherd, PA-C and Dr. Bertis Ruddy  Time of provider notification: [phone]  Initial Interventions Implemented:   Infusion stopped: Not applicable - patient presented in the lobby with the above symptoms.  Medications administered - see MAR for sequence and times of administration: Normal Saline 1 L IV, Diphenhydramine (Benadryl) 50 mg IV, Famotidine (Pepcid) 20 mg IV, and Methylprednisolone (Solu-Medrol) 125 mg IV  Additional interventions:  Daphane Shepherd, PA-C called report to ED. Patient transferred to ED via wheelchair with spouse accompanying.  Patient response to treatment: Symptoms remained the same following interventions.   Remaining Course of Treatment:    The patient was transferred to the ED.    Additional Information Regarding the Chain of Events (including reaction signs/symptoms, treatment administered, and outcome):   Patient presented to lobby with complaints of tongue and neck swelling. Patient reports swelling starting on one side of her tongue at 4 AM this morning. The patient has not started any new medication and took prescribed medication as normal. Patient reports taking benadryl at 7 AM, with little effect.  Patient ambulatory and capable of mumbled speech at presentation. No reports of difficulty breathing or drooling. Patient accessed and rescue medications given in infusion suite. Lorina Rabon, PA-C assessed and patient transported to ED.  Dr. Bertis Ruddy aware of the situation.

## 2023-01-05 ENCOUNTER — Other Ambulatory Visit: Payer: Self-pay | Admitting: Hematology and Oncology

## 2023-01-05 DIAGNOSIS — T783XXD Angioneurotic edema, subsequent encounter: Secondary | ICD-10-CM

## 2023-01-05 DIAGNOSIS — D539 Nutritional anemia, unspecified: Secondary | ICD-10-CM | POA: Diagnosis not present

## 2023-01-05 LAB — TYPE AND SCREEN: Antibody Screen: NEGATIVE

## 2023-01-05 LAB — BPAM RBC
ISSUE DATE / TIME: 202404261208
Unit Type and Rh: 5100

## 2023-01-05 LAB — BASIC METABOLIC PANEL
Anion gap: 8 (ref 5–15)
BUN: 26 mg/dL — ABNORMAL HIGH (ref 8–23)
CO2: 22 mmol/L (ref 22–32)
Calcium: 8.4 mg/dL — ABNORMAL LOW (ref 8.9–10.3)
Chloride: 106 mmol/L (ref 98–111)
Creatinine, Ser: 1.07 mg/dL — ABNORMAL HIGH (ref 0.44–1.00)
GFR, Estimated: 56 mL/min — ABNORMAL LOW (ref >60)
Glucose, Bld: 261 mg/dL — ABNORMAL HIGH (ref 70–99)
Potassium: 4.2 mmol/L (ref 3.5–5.1)
Sodium: 136 mmol/L (ref 135–145)

## 2023-01-05 LAB — CBC
HCT: 20.3 % — ABNORMAL LOW (ref 36.0–46.0)
HCT: 25.3 % — ABNORMAL LOW (ref 36.0–46.0)
Hemoglobin: 6.8 g/dL — CL (ref 12.0–15.0)
Hemoglobin: 8.4 g/dL — ABNORMAL LOW (ref 12.0–15.0)
MCH: 36.4 pg — ABNORMAL HIGH (ref 26.0–34.0)
MCH: 35.1 pg — ABNORMAL HIGH (ref 26.0–34.0)
MCHC: 33.5 g/dL (ref 30.0–36.0)
MCHC: 33.2 g/dL (ref 30.0–36.0)
MCV: 108.6 fL — ABNORMAL HIGH (ref 80.0–100.0)
MCV: 105.9 fL — ABNORMAL HIGH (ref 80.0–100.0)
Platelets: 51 10*3/uL — ABNORMAL LOW (ref 150–400)
Platelets: 61 10*3/uL — ABNORMAL LOW (ref 150–400)
RBC: 1.87 MIL/uL — ABNORMAL LOW (ref 3.87–5.11)
RBC: 2.39 MIL/uL — ABNORMAL LOW (ref 3.87–5.11)
RDW: 19.7 % — ABNORMAL HIGH (ref 11.5–15.5)
RDW: 19.4 % — ABNORMAL HIGH (ref 11.5–15.5)
WBC: 3.1 10*3/uL — ABNORMAL LOW (ref 4.0–10.5)
WBC: 5.9 10*3/uL (ref 4.0–10.5)
nRBC: 0 % (ref 0.0–0.2)
nRBC: 0 % (ref 0.0–0.2)

## 2023-01-05 LAB — COMPREHENSIVE METABOLIC PANEL
ALT: 11 U/L (ref 0–44)
AST: 18 U/L (ref 15–41)
Albumin: 3.5 g/dL (ref 3.5–5.0)
Alkaline Phosphatase: 37 U/L — ABNORMAL LOW (ref 38–126)
Anion gap: 8 (ref 5–15)
BUN: 27 mg/dL — ABNORMAL HIGH (ref 8–23)
CO2: 22 mmol/L (ref 22–32)
Calcium: 8.6 mg/dL — ABNORMAL LOW (ref 8.9–10.3)
Chloride: 110 mmol/L (ref 98–111)
Creatinine, Ser: 1.1 mg/dL — ABNORMAL HIGH (ref 0.44–1.00)
GFR, Estimated: 54 mL/min — ABNORMAL LOW (ref >60)
Glucose, Bld: 149 mg/dL — ABNORMAL HIGH (ref 70–99)
Potassium: 3.9 mmol/L (ref 3.5–5.1)
Sodium: 140 mmol/L (ref 135–145)
Total Bilirubin: 0.7 mg/dL (ref 0.3–1.2)
Total Protein: 6.2 g/dL — ABNORMAL LOW (ref 6.5–8.1)

## 2023-01-05 LAB — GLUCOSE, CAPILLARY
Glucose-Capillary: 159 mg/dL — ABNORMAL HIGH (ref 70–99)
Glucose-Capillary: 121 mg/dL — ABNORMAL HIGH (ref 70–99)
Glucose-Capillary: 135 mg/dL — ABNORMAL HIGH (ref 70–99)

## 2023-01-05 LAB — PREPARE RBC (CROSSMATCH)

## 2023-01-05 LAB — HEMOGLOBIN A1C
Hgb A1c MFr Bld: 4.5 % — ABNORMAL LOW (ref 4.8–5.6)
Mean Plasma Glucose: 82 mg/dL

## 2023-01-05 MED ORDER — DEXAMETHASONE 4 MG PO TABS: ORAL_TABLET | ORAL | 6 refills | Status: DC

## 2023-01-05 MED ORDER — AMLODIPINE BESYLATE 10 MG PO TABS
10.0000 mg | ORAL_TABLET | Freq: Every day | ORAL | Status: DC
  Administered 2023-01-05: 10 mg via ORAL
  Filled 2023-01-05: qty 1

## 2023-01-05 MED ORDER — SODIUM CHLORIDE 0.9% IV SOLUTION: Freq: Once | INTRAVENOUS | Status: AC

## 2023-01-05 MED ORDER — DIPHENHYDRAMINE HCL 25 MG PO TABS: 50.0000 mg | ORAL_TABLET | Freq: Four times a day (QID) | ORAL | 0 refills | Status: DC | PRN

## 2023-01-05 MED ORDER — HEPARIN SOD (PORK) LOCK FLUSH 100 UNIT/ML IV SOLN
500.0000 [IU] | INTRAVENOUS | Status: AC | PRN
  Administered 2023-01-05: 500 [IU]

## 2023-01-05 MED ORDER — FAMOTIDINE 20 MG PO TABS: 20.0000 mg | ORAL_TABLET | Freq: Every day | ORAL | 0 refills | Status: DC

## 2023-01-05 MED ORDER — AMLODIPINE BESYLATE 10 MG PO TABS: 10.0000 mg | ORAL_TABLET | Freq: Every day | ORAL | 6 refills | Status: DC

## 2023-01-05 NOTE — Progress Notes (Addendum)
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 Presents from the cancer center on 4/25 w/ cc: tongue swelling. Reports woke up about 4 am noticed 1/2 tongue swollen. This progressed t/o the morning. Was associated w/ difficulty swallowing and muffled voice change. In the clinic she was treated for possible hypersensitivity reaction (benadryl, 1 liter NS, pepcid, solumedrol 125mg  IV, epipen and subsequently referred to the ER.  On arrival to ER swelling had improved.  Able to close top and bottom of her teeth now, Voice improved work of breathing not present.  upper airway assessment no wheezing or stridor. Tongue midline. Still has some swelling sensation under the tongue but swelling has greatly improved . 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  4/26  ready for dc but hgb 6.8 so getting 1 unit blood prior to dc   Interim History / Subjective:  Feels better Objective   Blood pressure (Abnormal) 142/56, pulse 87, temperature 98.4 F (36.9 C), temperature source Oral, resp. rate 16, height 5\' 2"  (1.575 m), weight 92.5 kg, SpO2 100 %.        Intake/Output Summary (Last 24 hours) at 01/05/2023  0949 Last data filed at 01/04/2023 1619 Gross per 24 hour  Intake 515.09 ml  Output no documentation  Net 515.09 ml   Filed Weights   01/04/23 1014 01/04/23 1428 01/05/23 0500  Weight: 91.6 kg 92.4 kg 92.5 kg    Examination: General resting in bed looks at baseline HENT NCAT no JVD, no swelling no tongue swelling no stridor  Pulm clear Card rrr Abd soft  Ext warm and dry  Neuro intact   Resolved Hospital Problem list     Assessment & Plan:  Angioedema presume 2/2 ace I.  Resolved.  Plan Dc ace I -->listed as allergy  Pred taper 40mg  (start and dec by 10mg /d to 10mg  and stop_ Cont pepcid while on pred Change benadryl to PRN  H/o metastatic cervical cancer Plan F/U w/ onc Monday   Hyperglycemia (steroid induced)  Plan Resume amaryl and Jauvia at dc   CKD stage IIIa Plan F/u chem after xfusion  Pancytopenia, w/ progressive anemia and Mild but stable thrombocytopenia  Hgb dropped to 6.8 I spoke to Dr Bertis Ruddy agree that anemia likely dilutional  Plan Transfuse 1 unit PRBC   Best Practice (right click and "Reselect all SmartList Selections" daily)   Diet/type: clear liquids DVT prophylaxis: LMWH GI prophylaxis: H2B Lines: Central line Foley:  N/A Code Status:  full code Last date of mu    [id number]   ATTESTATION & SIGNATURE   STAFF NOTE: I, Dr Lavinia Sharps have personally reviewed patient's available data, including medical  history, events of note, physical examination and test results as part of my evaluation. I have discussed with resident/NP and other care providers such as pharmacist, RN and RRT.  In addition,  I personally evaluated patient and elicited key findings of   S: Date of admit 01/04/2023 with LOS 1 for today 01/05/2023 : Misty Deleon is   - resolved angioedema. Wants to go home bug hgb <7gm%. Wants to get prbc and then go home. Denies compalints. Husband at bedside  O:  Blood pressure (!) 144/61, pulse 86,  temperature 98.4 F (36.9 C), temperature source Oral, resp. rate 15, height 5\' 2"  (1.575 m), weight 92.5 kg, SpO2 100 %.   Alopecia Resting comfortably CTA bilaterally Normal  heart sounds   LABS    PULMONARY No results for input(s): "PHART", "PCO2ART", "PO2ART", "HCO3", "TCO2", "O2SAT" in the last 168 hours.  Invalid input(s): "PCO2", "PO2"  CBC Recent Labs  Lab 01/04/23 0927 01/04/23 1309 01/05/23 0617  HGB 8.9* 8.7* 6.8*  HCT 26.3* 25.7* 20.3*  WBC 4.7 4.8 3.1*  PLT 67* 56* 51*    COAGULATION No results for input(s): "INR" in the last 168 hours.  CARDIAC  No results for input(s): "TROPONINI" in the last 168 hours. No results for input(s): "PROBNP" in the last 168 hours.   CHEMISTRY Recent Labs  Lab 01/04/23 0927 01/04/23 1309 01/05/23 0535  NA 135  --  136  K 3.8  --  4.2  CL 105  --  106  CO2 19*  --  22  GLUCOSE 278*  --  261*  BUN 27*  --  26*  CREATININE 1.08* 1.07* 1.07*  CALCIUM 8.5*  --  8.4*   Estimated Creatinine Clearance: 51.8 mL/min (A) (by C-G formula based on SCr of 1.07 mg/dL (H)).   LIVER No results for input(s): "AST", "ALT", "ALKPHOS", "BILITOT", "PROT", "ALBUMIN", "INR" in the last 168 hours.   INFECTIOUS No results for input(s): "LATICACIDVEN", "PROCALCITON" in the last 168 hours.   ENDOCRINE CBG (last 3)  Recent Labs    01/04/23 1618 01/04/23 2131 01/05/23 0808  GLUCAP 262* 246* 159*         IMAGING x24h  - image(s) personally visualized  -   highlighted in bold No results found.    A: angioedema -resolved due to ace onhibitor ANemia -   P:  1 UPRBC No more ace ihibitor Pred short taper Home later today   Anti-infectives (From admission, onward)    None        Rest per NP/medical resident whose note is outlined above and that I agree with  The patient is critically ill with multiple organ system  Dr. Kalman Shan, M.D., [facility].C.P Pulmonary and Critical Care Medicine Staff  Physician [facility] Pager: [phone], If no answer or between  15:00h - 7:00h: call [phone]  01/05/2023 11:57 AM

## 2023-01-05 NOTE — Care Management Obs Status (Deleted)
MEDICARE OBSERVATION STATUS NOTIFICATION   Patient Details  Name: Misty Deleon MRN: [id number] Date of Birth: 08/21/1951   Medicare Observation Status Notification Given:  Yes    Lavenia Atlas, RN 01/05/2023, 6:03 PM

## 2023-01-05 NOTE — Care Management Obs Status (Addendum)
MEDICARE OBSERVATION STATUS NOTIFICATION   Patient Details  Name: Misty Deleon MRN: [id number] Date of Birth: [birth year]   Medicare Observation Status Notification Given:  Yes  This RNCM spoke with patient and husband at bedside for an extensive time to explain Medicare Observation status change.   Lavenia Atlas, RN 01/05/2023, 6:03 PM

## 2023-01-05 NOTE — Discharge Summary (Signed)
Physician Discharge Summary         Patient ID: Misty Deleon MRN: [id number] DOB/AGE: 1951-06-09 72 y.o.  Admit date: 01/04/2023 Discharge date: 01/05/2023  Discharge Diagnoses:    Active Hospital Problems   Diagnosis Date Noted   Angioedema 01/04/2023    Priority: 1.   Pancytopenia, acquired (HCC) 10/19/2022   Essential hypertension 10/01/2020   Deficiency anemia 08/30/2020   Chronic kidney disease (CKD), stage III (moderate) (HCC) 08/30/2020   Cervical cancer (HCC) 08/30/2020   Diabetes mellitus without complication [facility])     Resolved Hospital Problems  No resolved problems to display.      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 Presents from the cancer center on 4/25 w/ cc: tongue swelling. Reports woke up about 4 am noticed 1/2 tongue swollen. This progressed t/o the morning. Was associated w/ difficulty swallowing and muffled voice change. In the clinic she was treated for possible hypersensitivity reaction (benadryl, 1 liter NS, pepcid, solumedrol 125mg  IV, epipen and subsequently referred to the ER.  On arrival to ER swelling had improved.  Able to close top and bottom of her teeth now, Voice improved work of breathing not present.  upper airway assessment no wheezing or stridor. Tongue midline. Still has some swelling sensation under the tongue but swelling has greatly improved . Was placed on humidified O2 and PCCM asked to admit to the ICU for observation. She was admitted overnight. Therapeutic interventions included: IV steroids, H2 blockade and close observation. By am 4/26 her angioedema had completely resolved. Of note she was ready for dc however her hgb was 6.8 so after discussing this w/ Dr Bertis Ruddy we opted to transfuse her then check follow up labs so that she can hopefully resume chemo on Monday 4/29. As of this time she is ready for dc    Discharge Plan by Active Problems    Angioedema presume  2/2 ace I. Doubt hereditary so no role for C1 inhibitor or TXA or plasma  Plan Admit to ICU for airway monitoring  Dc ace I  Will have her take an extra dose of  decadron (usually takes 8 mg the night before Chemo, so will take dose tomorrow and then her usual dose Sunday) & H2B (pepcid for next 3 days)  H/o metastatic cervical cancer Plan F/U w/ onc   Hypertension  Plan Stop ace I  Start norvasc  She will notify her PCP of this change   Hyperglycemia (steroid induced)  Plan Monitor   CKD stage IIIa Plan Stable, f/u outpt   Mild pancytopenia and anemia Got one unit of prbc for hgb 6.8, now 8.4 PLTs remain stable but in 60s Plan Monitor outpt   Discharge Exam: Blood Pressure (Abnormal) 167/91   Pulse 82   Temperature 98.3 F (36.8 C) (Oral)   Respiration (Abnormal) 21   Height 5\' 2"  (1.575 m)   Weight 92.5 kg   Oxygen Saturation 100%   Body Mass Index 37.30 kg/m   General resting in bed looks at baseline HENT NCAT no JVD, no swelling no tongue swelling no stridor  Pulm clear Card rrr Abd soft  Ext warm and dry  Neuro intact   Labs at discharge   Lab Results  Component Value Date   CREATININE 1.10 (H) 01/05/2023   BUN 27 (H) 01/05/2023   NA 140 01/05/2023   K 3.9 01/05/2023   CL 110 01/05/2023   CO2  22 01/05/2023   Lab Results  Component Value Date   WBC 3.1 (L) 01/05/2023   HGB 6.8 (LL) 01/05/2023   HCT 20.3 (L) 01/05/2023   MCV 108.6 (H) 01/05/2023   PLT 51 (L) 01/05/2023   Lab Results  Component Value Date   ALT 11 01/05/2023   AST 18 01/05/2023   ALKPHOS 37 (L) 01/05/2023   BILITOT 0.7 01/05/2023   Lab Results  Component Value Date   INR 1.1 08/24/2022    Current radiological studies    No results found.  Disposition:  Home   Discharge disposition: 01-Home or Self Care       Discharge Instructions     Diet - low sodium heart healthy   Complete by: As directed    Increase activity slowly   Complete by: As directed     infusion appt request (390 min)   Complete by: Jan 08, 2023    Contact your oncology clinic or infusion center to schedule this appointment.   infusion appt request (390 min)   Complete by: Jan 29, 2023    Contact your oncology clinic or infusion center to schedule this appointment.   infusion appt request (390 min)   Complete by: Feb 19, 2023    Contact your oncology clinic or infusion center to schedule this appointment.       Allergies as of 01/05/2023     Allergen Reactions Comments   Lisinopril Swelling, Other (See Comments) Entire tongue became swollen and the exterior front of the throat. No shortness of breath noted.        Medication List     Stop taking these medications    lisinopril 10 MG tablet Commonly known as: ZESTRIL       Take these medications    amLODipine 10 MG tablet Commonly known as: NORVASC Take 1 tablet (10 mg total) by mouth daily. Start taking on: January 06, 2023   atenolol 25 MG tablet Commonly known as: Tenormin Take 1 tablet (25 mg total) by mouth daily. Take in the morning   cyanocobalamin 1000 MCG tablet Commonly known as: VITAMIN B12 Take 1,000 mcg by mouth in the morning.   dexamethasone 4 MG tablet Commonly known as: DECADRON Take 2 tabs at the night before chemotherapy, every 3 weeks, by mouth x 6 cycles In addition take 2 tabs on 4/27 What changed: additional instructions   diphenhydrAMINE 25 MG tablet Commonly known as: Benadryl Allergy Take 2 tablets (50 mg total) by mouth every 6 (six) hours as needed for allergies.   ezetimibe 10 MG tablet Commonly known as: ZETIA Take 10 mg by mouth in the morning.   famotidine 20 MG tablet Commonly known as: Pepcid Take 1 tablet (20 mg total) by mouth daily for 5 days.   glimepiride 1 MG tablet Commonly known as: AMARYL Take 1 mg by mouth 2 (two) times daily.   lidocaine-prilocaine cream Commonly known as: EMLA Apply to affected area once What changed:  how much to  take how to take this when to take this reasons to take this additional instructions   magnesium oxide 400 (241.3 Mg) MG tablet Commonly known as: MAG-OX Take 1 tablet (400 mg total) by mouth 2 (two) times daily.   ondansetron 8 MG tablet Commonly known as: Zofran Take 1 tablet (8 mg total) by mouth every 8 (eight) hours as needed for nausea or vomiting. Start on the third day after chemotherapy.   prochlorperazine 10 MG tablet Commonly known as:  COMPAZINE Take 1 tablet (10 mg total) by mouth every 6 (six) hours as needed for nausea or vomiting.   rosuvastatin 40 MG tablet Commonly known as: CRESTOR Take 40 mg by mouth at bedtime.   sitaGLIPtin 100 MG tablet Commonly known as: JANUVIA Take 50 mg by mouth in the morning.   traMADol 50 MG tablet Commonly known as: ULTRAM Take 1 tablet (50 mg total) by mouth every 6 (six) hours as needed. What changed: reasons to take this   TYLENOL 500 MG tablet Generic drug: acetaminophen Take 500-1,000 mg by mouth every 6 (six) hours as needed for mild pain or headache.         Follow-up appointment   \Dr Alphonsus Sias and her primary PCP Discharge Condition:    good  Physician Statement:   The Patient was personally examined, the discharge assessment and plan has been personally reviewed and I agree with ACNP Houa Ackert's assessment and plan. 32 minutes of time have been dedicated to discharge assessment, planning and discharge instructions.   Signed: Shelby Mattocks 01/05/2023, 4:36 PM

## 2023-01-05 NOTE — Care Management CC44 (Signed)
Condition Code 44 Documentation Completed  Patient Details  Name: VERGIA CHEA MRN: [id number] Date of Birth: 1951-08-15   Condition Code 44 given:  Yes Patient signature on Condition Code 44 notice:  Yes Documentation of 2 MD's agreement:  Yes Code 44 added to claim:  Yes    Lavenia Atlas, RN 01/05/2023, 6:03 PM

## 2023-01-05 NOTE — Progress Notes (Signed)
Hemoglobin 6.7 on morning labs, redrawn, awaiting results.

## 2023-01-07 LAB — BPAM RBC: Blood Product Expiration Date: 202406012359

## 2023-01-07 LAB — TYPE AND SCREEN
ABO/RH(D): O POS
Unit division: 0

## 2023-01-08 ENCOUNTER — Encounter: Payer: Self-pay | Admitting: Hematology and Oncology

## 2023-01-08 ENCOUNTER — Other Ambulatory Visit: Payer: Self-pay | Admitting: Hematology and Oncology

## 2023-01-08 ENCOUNTER — Inpatient Hospital Stay: Payer: Medicare PPO

## 2023-01-08 ENCOUNTER — Inpatient Hospital Stay (HOSPITAL_BASED_OUTPATIENT_CLINIC_OR_DEPARTMENT_OTHER): Payer: Medicare PPO | Admitting: Hematology and Oncology

## 2023-01-08 VITALS — BP 158/76 | HR 95 | Temp 98.3°F | Resp 18

## 2023-01-08 DIAGNOSIS — C538 Malignant neoplasm of overlapping sites of cervix uteri: Secondary | ICD-10-CM | POA: Diagnosis not present

## 2023-01-08 DIAGNOSIS — D61818 Other pancytopenia: Secondary | ICD-10-CM

## 2023-01-08 DIAGNOSIS — I10 Essential (primary) hypertension: Secondary | ICD-10-CM

## 2023-01-08 DIAGNOSIS — C539 Malignant neoplasm of cervix uteri, unspecified: Secondary | ICD-10-CM

## 2023-01-08 DIAGNOSIS — Z5112 Encounter for antineoplastic immunotherapy: Secondary | ICD-10-CM | POA: Diagnosis not present

## 2023-01-08 DIAGNOSIS — N183 Chronic kidney disease, stage 3 unspecified: Secondary | ICD-10-CM

## 2023-01-08 LAB — CBC WITH DIFFERENTIAL (CANCER CENTER ONLY)
Abs Immature Granulocytes: 0.04 10*3/uL (ref 0.00–0.07)
Basophils Absolute: 0 10*3/uL (ref 0.0–0.1)
Basophils Relative: 0 %
Eosinophils Absolute: 0 10*3/uL (ref 0.0–0.5)
Eosinophils Relative: 0 %
HCT: 30.2 % — ABNORMAL LOW (ref 36.0–46.0)
Hemoglobin: 10.4 g/dL — ABNORMAL LOW (ref 12.0–15.0)
Immature Granulocytes: 1 %
Lymphocytes Relative: 11 %
Lymphs Abs: 0.5 10*3/uL — ABNORMAL LOW (ref 0.7–4.0)
MCH: 35.1 pg — ABNORMAL HIGH (ref 26.0–34.0)
MCHC: 34.4 g/dL (ref 30.0–36.0)
MCV: 102 fL — ABNORMAL HIGH (ref 80.0–100.0)
Monocytes Absolute: 0.1 10*3/uL (ref 0.1–1.0)
Monocytes Relative: 1 %
Neutro Abs: 3.7 10*3/uL (ref 1.7–7.7)
Neutrophils Relative %: 87 %
Platelet Count: 99 10*3/uL — ABNORMAL LOW (ref 150–400)
RBC: 2.96 MIL/uL — ABNORMAL LOW (ref 3.87–5.11)
RDW: 17.9 % — ABNORMAL HIGH (ref 11.5–15.5)
WBC Count: 4.3 10*3/uL (ref 4.0–10.5)
nRBC: 0.5 % — ABNORMAL HIGH (ref 0.0–0.2)

## 2023-01-08 MED ORDER — FAMOTIDINE IN NACL 20-0.9 MG/50ML-% IV SOLN
20.0000 mg | Freq: Once | INTRAVENOUS | Status: AC
  Administered 2023-01-08: 20 mg via INTRAVENOUS
  Filled 2023-01-08: qty 50

## 2023-01-08 MED ORDER — SODIUM CHLORIDE 0.9 % IV SOLN
375.2000 mg | Freq: Once | INTRAVENOUS | Status: AC
  Administered 2023-01-08: 380 mg via INTRAVENOUS
  Filled 2023-01-08: qty 38

## 2023-01-08 MED ORDER — TRAMADOL HCL 50 MG PO TABS: 50.0000 mg | ORAL_TABLET | Freq: Four times a day (QID) | ORAL | 0 refills | Status: DC | PRN

## 2023-01-08 MED ORDER — LIDOCAINE-PRILOCAINE 2.5-2.5 % EX CREA
1.0000 | TOPICAL_CREAM | CUTANEOUS | 3 refills | Status: AC | PRN
Start: 2023-01-08 — End: ?

## 2023-01-08 MED ORDER — SODIUM CHLORIDE 0.9 % IV SOLN
10.0000 mg | Freq: Once | INTRAVENOUS | Status: AC
  Administered 2023-01-08: 10 mg via INTRAVENOUS
  Filled 2023-01-08: qty 10

## 2023-01-08 MED ORDER — SODIUM CHLORIDE 0.9 % IV SOLN
150.0000 mg | Freq: Once | INTRAVENOUS | Status: AC
  Administered 2023-01-08: 150 mg via INTRAVENOUS
  Filled 2023-01-08: qty 150

## 2023-01-08 MED ORDER — SODIUM CHLORIDE 0.9 % IV SOLN
15.0000 mg/kg | Freq: Once | INTRAVENOUS | Status: AC
  Administered 2023-01-08: 1400 mg via INTRAVENOUS
  Filled 2023-01-08: qty 8

## 2023-01-08 MED ORDER — SODIUM CHLORIDE 0.9% FLUSH
10.0000 mL | INTRAVENOUS | Status: DC | PRN
  Administered 2023-01-08: 10 mL

## 2023-01-08 MED ORDER — ATENOLOL 25 MG PO TABS: 25.0000 mg | ORAL_TABLET | Freq: Every day | ORAL | 1 refills | Status: DC

## 2023-01-08 MED ORDER — SODIUM CHLORIDE 0.9 % IV SOLN: Freq: Once | INTRAVENOUS | Status: AC

## 2023-01-08 MED ORDER — CETIRIZINE HCL 10 MG/ML IV SOLN
10.0000 mg | Freq: Once | INTRAVENOUS | Status: AC
  Administered 2023-01-08: 10 mg via INTRAVENOUS
  Filled 2023-01-08: qty 1

## 2023-01-08 MED ORDER — PALONOSETRON HCL INJECTION 0.25 MG/5ML
0.2500 mg | Freq: Once | INTRAVENOUS | Status: AC
  Administered 2023-01-08: 0.25 mg via INTRAVENOUS
  Filled 2023-01-08: qty 5

## 2023-01-08 MED ORDER — SODIUM CHLORIDE 0.9 % IV SOLN
105.0000 mg/m2 | Freq: Once | INTRAVENOUS | Status: AC
  Administered 2023-01-08: 210 mg via INTRAVENOUS
  Filled 2023-01-08: qty 35

## 2023-01-08 MED ORDER — SODIUM CHLORIDE 0.9 % IV SOLN
200.0000 mg | Freq: Once | INTRAVENOUS | Status: AC
  Administered 2023-01-08: 200 mg via INTRAVENOUS
  Filled 2023-01-08: qty 200

## 2023-01-08 MED ORDER — HEPARIN SOD (PORK) LOCK FLUSH 100 UNIT/ML IV SOLN
500.0000 [IU] | Freq: Once | INTRAVENOUS | Status: AC | PRN
  Administered 2023-01-08: 500 [IU]

## 2023-01-08 NOTE — Progress Notes (Signed)
Per Dr. Bertis Ruddy, ok to treat with Plt 99. Ok to use CMET from 01/05/2023.

## 2023-01-08 NOTE — Patient Instructions (Signed)
[place] [facility]  Discharge Instructions: Thank you for choosing [facility] to provide your oncology and hematology care.   If you have a lab appointment with the Cancer Center, please go directly to the Cancer Center and check in at the registration area.   Wear comfortable clothing and clothing appropriate for easy access to any Portacath or PICC line.   We strive to give you quality time with your provider. You may need to reschedule your appointment if you arrive late (15 or more minutes).  Arriving late affects you and other patients whose appointments are after yours.  Also, if you miss three or more appointments without notifying the office, you may be dismissed from the clinic at the provider's discretion.      For prescription refill requests, have your pharmacy contact our office and allow 72 hours for refills to be completed.    Today you received the following chemotherapy and/or immunotherapy agents keytruda, bevacizumab, paclitaxel, carboplatin      To help prevent nausea and vomiting after your treatment, we encourage you to take your nausea medication as directed.  BELOW ARE SYMPTOMS THAT SHOULD BE REPORTED IMMEDIATELY: *FEVER GREATER THAN 100.4 F (38 C) OR HIGHER *CHILLS OR SWEATING *NAUSEA AND VOMITING THAT IS NOT CONTROLLED WITH YOUR NAUSEA MEDICATION *UNUSUAL SHORTNESS OF BREATH *UNUSUAL BRUISING OR BLEEDING *URINARY PROBLEMS (pain or burning when urinating, or frequent urination) *BOWEL PROBLEMS (unusual diarrhea, constipation, pain near the anus) TENDERNESS IN MOUTH AND THROAT WITH OR WITHOUT PRESENCE OF ULCERS (sore throat, sores in mouth, or a toothache) UNUSUAL RASH, SWELLING OR PAIN  UNUSUAL VAGINAL DISCHARGE OR ITCHING   Items with * indicate a potential emergency and should be followed up as soon as possible or go to the Emergency Department if any problems should occur.  Please show the CHEMOTHERAPY ALERT CARD  or IMMUNOTHERAPY ALERT CARD at check-in to the Emergency Department and triage nurse.  Should you have questions after your visit or need to cancel or reschedule your appointment, please contact [place] [facility]  Dept: [phone]  and follow the prompts.  Office hours are 8:00 a.m. to 4:30 p.m. Monday - Friday. Please note that voicemails left after 4:00 p.m. may not be returned until the following business day.  We are closed weekends and major holidays. You have access to a nurse at all times for urgent questions. Please call the main number to the clinic Dept: [phone] and follow the prompts.   For any non-urgent questions, you may also contact your provider using MyChart. We now offer e-Visits for anyone 83 and older to request care online for non-urgent symptoms. For details visit [email or web address].   Also download the MyChart app! Go to the app store, search "MyChart", open the app, select [place], and log in with your MyChart username and password.

## 2023-01-08 NOTE — Assessment & Plan Note (Signed)
Her blood pressure is slightly better controlled She will take atenolol in the morning and amlodipine in the evening

## 2023-01-08 NOTE — Progress Notes (Signed)
[facility] OFFICE PROGRESS NOTE  Patient Care Team: Marletta Lor, NP as PCP - General (Nurse Practitioner)  ASSESSMENT & PLAN:  Cervical cancer [facility]) She is recovering well from recent angioedema episode We will resume chemotherapy today with minor dose changes as well as additional recovery days is added for future cycle to allow bone marrow recovery due to significant pancytopenia requiring transfusion support The plan of care is fully discussed with the patient and she is in agreement  Pancytopenia, acquired [facility]) This is multifactorial related to side effects of treatment and recent angioedema as well as component of hemodilution She does not need transfusion support We will proceed with treatment with dose adjustment and additional days of future treatment cycle to allow bone marrow recovery  Essential hypertension Her blood pressure is slightly better controlled She will take atenolol in the morning and amlodipine in the evening  Chronic kidney disease (CKD), stage III (moderate) (HCC) She has intermittent elevated serum creatinine She has multiple risk factors including diabetes and hypertension I will adjust the dose of her treatment accordingly  No orders of the defined types were placed in this encounter.   All questions were answered. The patient knows to call the clinic with any problems, questions or concerns. The total time spent in the appointment was 40 minutes encounter with patients including review of chart and various tests results, discussions about plan of care and coordination of care plan   Artis Delay, MD 01/08/2023 10:32 AM  INTERVAL HISTORY: Please see below for problem oriented charting. she returns for treatment follow-up seen in the infusion room She was hospitalized due to acute reaction to ACE inhibitor She received blood transfusion prior to discharge She felt better and has excellent energy level She started to modify her diet and  start checking blood sugars at home which are within normal range She denies any elevated blood pressure recently No recent bleeding  REVIEW OF SYSTEMS:   Constitutional: Denies fevers, chills or abnormal weight loss Eyes: Denies blurriness of vision Ears, nose, mouth, throat, and face: Denies mucositis or sore throat Respiratory: Denies cough, dyspnea or wheezes Cardiovascular: Denies palpitation, chest discomfort or lower extremity swelling Gastrointestinal:  Denies nausea, heartburn or change in bowel habits Skin: Denies abnormal skin rashes Lymphatics: Denies new lymphadenopathy or easy bruising Neurological:Denies numbness, tingling or new weaknesses Behavioral/Psych: Mood is stable, no new changes  All other systems were reviewed with the patient and are negative.  I have reviewed the past medical history, past surgical history, social history and family history with the patient and they are unchanged from previous note.  ALLERGIES:  is allergic to lisinopril.  MEDICATIONS:  Current Outpatient Medications  Medication Sig Dispense Refill   amLODipine (NORVASC) 10 MG tablet Take 1 tablet (10 mg total) by mouth daily. 30 tablet 6   atenolol (TENORMIN) 25 MG tablet Take 1 tablet (25 mg total) by mouth daily. Take in the morning 90 tablet 1   dexamethasone (DECADRON) 4 MG tablet Take 2 tabs at the night before chemotherapy, every 3 weeks, by mouth x 6 cycles In addition take 2 tabs on 4/27 36 tablet 6   diphenhydrAMINE (BENADRYL ALLERGY) 25 MG tablet Take 2 tablets (50 mg total) by mouth every 6 (six) hours as needed for allergies. 30 tablet 0   ezetimibe (ZETIA) 10 MG tablet Take 10 mg by mouth in the morning.     famotidine (PEPCID) 20 MG tablet Take 1 tablet (20 mg total) by mouth  daily for 5 days. 5 tablet 0   glimepiride (AMARYL) 1 MG tablet Take 1 mg by mouth 2 (two) times daily.     lidocaine-prilocaine (EMLA) cream Apply 1 Application topically as needed (for port access). 30  g 3   magnesium oxide (MAG-OX) 400 (241.3 Mg) MG tablet Take 1 tablet (400 mg total) by mouth 2 (two) times daily. 60 tablet 11   ondansetron (ZOFRAN) 8 MG tablet Take 1 tablet (8 mg total) by mouth every 8 (eight) hours as needed for nausea or vomiting. Start on the third day after chemotherapy. 30 tablet 1   prochlorperazine (COMPAZINE) 10 MG tablet Take 1 tablet (10 mg total) by mouth every 6 (six) hours as needed for nausea or vomiting. 30 tablet 1   rosuvastatin (CRESTOR) 40 MG tablet Take 40 mg by mouth at bedtime.     sitaGLIPtin (JANUVIA) 100 MG tablet Take 50 mg by mouth in the morning.     traMADol (ULTRAM) 50 MG tablet Take 1 tablet (50 mg total) by mouth every 6 (six) hours as needed (for pain). 60 tablet 0   TYLENOL 500 MG tablet Take 500-1,000 mg by mouth every 6 (six) hours as needed for mild pain or headache.     vitamin B-12 (CYANOCOBALAMIN) 1000 MCG tablet Take 1,000 mcg by mouth in the morning.     No current facility-administered medications for this visit.   Facility-Administered Medications Ordered in Other Visits  Medication Dose Route Frequency Provider Last Rate Last Admin   bevacizumab-awwb (MVASI) 1,400 mg in sodium chloride 0.9 % 100 mL chemo infusion  15 mg/kg (Treatment Plan Recorded) Intravenous Once Artis Delay, MD 312 mL/hr at 01/08/23 1012 1,400 mg at 01/08/23 1012   CARBOplatin (PARAPLATIN) 380 mg in sodium chloride 0.9 % 100 mL chemo infusion  380 mg Intravenous Once Bertis Ruddy, Amon Costilla, MD       heparin lock flush 100 unit/mL  500 Units Intracatheter Once PRN Bertis Ruddy, Jazell Rosenau, MD       PACLitaxel (TAXOL) 210 mg in sodium chloride 0.9 % 250 mL chemo infusion (> 80mg /m2)  105 mg/m2 (Treatment Plan Recorded) Intravenous Once Anaysia Germer, MD       sodium chloride flush (NS) 0.9 % injection 10 mL  10 mL Intracatheter PRN Artis Delay, MD        SUMMARY OF ONCOLOGIC HISTORY: Oncology History Overview Note  Adenocarcinoma, 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   08/27/2020 Imaging   MRI pelvis Large cervical mass with pelvic adenopathy, at least T2B N1 disease. Question of involvement of posterior urinary bladder (T4) for which additional images have been requested. Patient will return for additional images to answer this question. (Thinner section axial T2 without fat sat and sagittal T2 weighted imaging also without fat saturation.)   Small field-of-view images show that the parametrial extension comes in very close proximity to the RIGHT ureter.   08/30/2020 Initial Diagnosis   Cervical cancer (HCC)   08/30/2020 Cancer Staging   Staging form: Cervix Uteri, AJCC Version 9 - Clinical stage from 08/30/2020: FIGO Stage IVB (rcT2, cN1, pM1) - Signed by Artis Delay, MD on 09/01/2022 Stage prefix: Recurrence   09/01/2020 PET scan   1. Large cervical mass extending into the endometrial canal to the level of the uterine fundus, associated with pelvic adenopathy to the  level of the RIGHT common iliac chain. Please refer to MRI for further detail. 2. No signs of solid organ uptake or metastatic disease to the chest. 3. Subcutaneous nodule with marked increased metabolic activity, correlation with direct clinical inspection is suggested to exclude cutaneous neoplasm or subcutaneous nodule in this location. Complicated sebaceous cyst could also potentially have this appearance. 4. Uptake in the anal canal could likely physiologic. Given the intense nature of the uptake would suggest correlation with direct clinical inspection/examination. 5. Uptake in axillary lymph nodes with activity less than mediastinal blood pool likely small reactive lymph nodes, attention on follow-up. Morphologic features also with benign appearance.   09/16/2020 Procedure   Successful placement of a power  injectable Port-A-Cath via the right internal jugular vein. The catheter is ready for immediate use.     09/20/2020 - 10/25/2020 Chemotherapy   She received cisplatin chemo with concurrent radiation       09/22/2020 - 12/08/2020 Radiation Therapy   Radiation Treatment Dates: 09/22/2020 through 12/08/2020   Site: Pelvis Technique: IMRT Total Dose (Gy): 45/45 Dose per Fx (Gy): 1.8 Completed Fx: 25/25 Beam Energies: 6x   Site: pelvic boost Technique: IMRT Total Dose (Gy): 7.2/7.2 Dose per Fx (Gy): 1.8 Completed Fx: 4/4 Beam Energies: 6x   Site: Cervix boost Technique: HDR-brachytherapy Total Dose (Gy): 27.5/27.5 Dose per Fx (Gy): 5.5 Completed Fx: 5/5 Beam Energies: Ir-192     03/17/2021 PET scan   1. Mark positive response to therapy. 2. Resolution of metabolic activity within the uterine body. 3. Resolution of size and metabolic activity of pelvic lymph nodes. 4. No evidence of new disease outside of the pelvis.   08/10/2022 PET scan   1. New hypermetabolic bilateral pulmonary nodules and hypermetabolic cervical and hilar lymph nodes, consistent with metastatic disease. 2. Hypermetabolic focus in the anterior right third costochondral junction without discrete CT correlate is also compatible with metastatic disease. 3. Prior hysterectomy without suspicious hypermetabolic nodularity along the vaginal cuff. 4.  Aortic Atherosclerosis (ICD10-I70.0).   08/24/2022 Pathology Results   A. SOFT TISSUE NODULE, RIGHT CHEST WALL, NEEDLE CORE BIOPSY:  Poorly differentiated carcinoma.  See comment.   COMMENT:  The carcinoma is characterized by nest of malignant epithelioid cells which focally has features suggestive of squamous differentiation morphologically.  Immunohistochemistry is positive with cytokeratin 7, PAX8, MOC-31 and estrogen receptor.  There is patchy positivity with p16, progesterone receptor and TTF-1.  The tumor is negative with p40, cytokeratin 5/6, p63, CDX2,  cytokeratin 20 and WT-1.  The immunophenotype is consistent with adenocarcinoma and suggestive of a gynecologic primary.    09/08/2022 -  Chemotherapy   Patient is on Treatment Plan : OVARIAN Carboplatin + Paclitaxel + Bevacizumab q21d      11/07/2022 Imaging   1. Decreased size of the pulmonary nodules and mediastinal adenopathy, consistent with treatment response. 2. Similar appearance of the previously hypermetabolic soft tissue subjacent to the right anterior third costochondral cartilage. 3. No convincing evidence of new or progressive disease in the chest, abdomen or pelvis. 4. Rectal wall thickening with stranding in the mesorectal fat and diffuse thickening of the urinary bladder wall, nonspecific but possibly reflecting post radiation change. 5. Nonobstructive right renal stones measure up to 6 mm in the renal pelvis. 6. Colonic diverticulosis without findings of acute diverticulitis. 7.  Aortic Atherosclerosis (ICD10-I70.0).   Malignant neoplasm of cervix (HCC)  09/01/2020 Initial Diagnosis   Malignant neoplasm of cervix (HCC)   09/20/2020 - 10/25/2020 Chemotherapy   She received  cisplatin chemo with concurrent radiation       09/22/2020 - 12/08/2020 Radiation Therapy   Radiation Treatment Dates: 09/22/2020 through 12/08/2020   Site: Pelvis Technique: IMRT Total Dose (Gy): 45/45 Dose per Fx (Gy): 1.8 Completed Fx: 25/25 Beam Energies: 6x   Site: pelvic boost Technique: IMRT Total Dose (Gy): 7.2/7.2 Dose per Fx (Gy): 1.8 Completed Fx: 4/4 Beam Energies: 6x   Site: Cervix boost Technique: HDR-brachytherapy Total Dose (Gy): 27.5/27.5 Dose per Fx (Gy): 5.5 Completed Fx: 5/5 Beam Energies: Ir-192   09/08/2022 -  Chemotherapy   Patient is on Treatment Plan : OVARIAN Carboplatin + Paclitaxel + Bevacizumab q21d        PHYSICAL EXAMINATION: ECOG PERFORMANCE STATUS: 1 - Symptomatic but completely ambulatory  GENERAL:alert, no distress and comfortable  NEURO: alert &  oriented x 3 with fluent speech, no focal motor/sensory deficits  LABORATORY DATA:  I have reviewed the data as listed    Component Value Date/Time   NA 140 01/05/2023 1600   K 3.9 01/05/2023 1600   CL 110 01/05/2023 1600   CO2 22 01/05/2023 1600   GLUCOSE 149 (H) 01/05/2023 1600   BUN 27 (H) 01/05/2023 1600   CREATININE 1.10 (H) 01/05/2023 1600   CREATININE 1.25 (H) 12/14/2022 0747   CALCIUM 8.6 (L) 01/05/2023 1600   PROT 6.2 (L) 01/05/2023 1600   ALBUMIN 3.5 01/05/2023 1600   AST 18 01/05/2023 1600   AST 16 12/14/2022 0747   ALT 11 01/05/2023 1600   ALT 11 12/14/2022 0747   ALKPHOS 37 (L) 01/05/2023 1600   BILITOT 0.7 01/05/2023 1600   BILITOT 0.6 12/14/2022 0747   GFRNONAA 54 (L) 01/05/2023 1600   GFRNONAA 46 (L) 12/14/2022 0747    No results found for: "SPEP", "UPEP"  Lab Results  Component Value Date   WBC 4.3 01/08/2023   NEUTROABS 3.7 01/08/2023   HGB 10.4 (L) 01/08/2023   HCT 30.2 (L) 01/08/2023   MCV 102.0 (H) 01/08/2023   PLT 99 (L) 01/08/2023      Chemistry      Component Value Date/Time   NA 140 01/05/2023 1600   K 3.9 01/05/2023 1600   CL 110 01/05/2023 1600   CO2 22 01/05/2023 1600   BUN 27 (H) 01/05/2023 1600   CREATININE 1.10 (H) 01/05/2023 1600   CREATININE 1.25 (H) 12/14/2022 0747      Component Value Date/Time   CALCIUM 8.6 (L) 01/05/2023 1600   ALKPHOS 37 (L) 01/05/2023 1600   AST 18 01/05/2023 1600   AST 16 12/14/2022 0747   ALT 11 01/05/2023 1600   ALT 11 12/14/2022 0747   BILITOT 0.7 01/05/2023 1600   BILITOT 0.6 12/14/2022 0747

## 2023-01-08 NOTE — Assessment & Plan Note (Signed)
This is multifactorial related to side effects of treatment and recent angioedema as well as component of hemodilution She does not need transfusion support We will proceed with treatment with dose adjustment and additional days of future treatment cycle to allow bone marrow recovery

## 2023-01-08 NOTE — Assessment & Plan Note (Signed)
She has intermittent elevated serum creatinine She has multiple risk factors including diabetes and hypertension I will adjust the dose of her treatment accordingly 

## 2023-01-08 NOTE — Assessment & Plan Note (Signed)
She is recovering well from recent angioedema episode We will resume chemotherapy today with minor dose changes as well as additional recovery days is added for future cycle to allow bone marrow recovery due to significant pancytopenia requiring transfusion support The plan of care is fully discussed with the patient and she is in agreement

## 2023-01-09 ENCOUNTER — Telehealth: Payer: Self-pay | Admitting: Hematology and Oncology

## 2023-01-09 NOTE — Telephone Encounter (Signed)
Patient called to cancel 5/9 f/u appointment with Dr. Roselind Messier. Patient not interested in rescheduling.

## 2023-01-10 ENCOUNTER — Other Ambulatory Visit: Payer: Self-pay

## 2023-01-18 ENCOUNTER — Ambulatory Visit: Payer: Self-pay | Admitting: Radiation Oncology

## 2023-01-25 ENCOUNTER — Other Ambulatory Visit: Payer: Medicare PPO

## 2023-01-25 ENCOUNTER — Ambulatory Visit: Payer: Medicare PPO | Admitting: Hematology and Oncology

## 2023-01-25 ENCOUNTER — Ambulatory Visit: Payer: Medicare PPO

## 2023-01-26 ENCOUNTER — Encounter: Payer: Self-pay | Admitting: Oncology

## 2023-01-26 DIAGNOSIS — E119 Type 2 diabetes mellitus without complications: Secondary | ICD-10-CM

## 2023-01-26 DIAGNOSIS — E785 Hyperlipidemia, unspecified: Secondary | ICD-10-CM

## 2023-01-26 NOTE — Progress Notes (Signed)
Orders placed for lipid panel and HgbA1C per Zahira's PCP, Marletta Lor, NP prescription to be drawn at 02/02/23 port flush/lab appointment.  Fax results when available to [place] at [phone].

## 2023-01-30 ENCOUNTER — Other Ambulatory Visit: Payer: Self-pay

## 2023-02-01 MED FILL — Fosaprepitant Dimeglumine For IV Infusion 150 MG (Base Eq): INTRAVENOUS | Qty: 5 | Status: AC

## 2023-02-01 MED FILL — Dexamethasone Sodium Phosphate Inj 100 MG/10ML: INTRAMUSCULAR | Qty: 1 | Status: AC

## 2023-02-02 ENCOUNTER — Encounter: Payer: Self-pay | Admitting: Hematology and Oncology

## 2023-02-02 ENCOUNTER — Inpatient Hospital Stay (HOSPITAL_BASED_OUTPATIENT_CLINIC_OR_DEPARTMENT_OTHER): Payer: Medicare PPO | Admitting: Hematology and Oncology

## 2023-02-02 ENCOUNTER — Inpatient Hospital Stay: Payer: Medicare PPO

## 2023-02-02 ENCOUNTER — Other Ambulatory Visit: Payer: Self-pay

## 2023-02-02 ENCOUNTER — Inpatient Hospital Stay: Payer: Medicare PPO | Attending: Radiation Oncology

## 2023-02-02 VITALS — BP 163/80 | HR 95 | Temp 97.5°F | Resp 18 | Ht 62.0 in | Wt 201.8 lb

## 2023-02-02 DIAGNOSIS — E1165 Type 2 diabetes mellitus with hyperglycemia: Secondary | ICD-10-CM | POA: Insufficient documentation

## 2023-02-02 DIAGNOSIS — D61818 Other pancytopenia: Secondary | ICD-10-CM

## 2023-02-02 DIAGNOSIS — Z7984 Long term (current) use of oral hypoglycemic drugs: Secondary | ICD-10-CM | POA: Diagnosis not present

## 2023-02-02 DIAGNOSIS — E119 Type 2 diabetes mellitus without complications: Secondary | ICD-10-CM | POA: Diagnosis not present

## 2023-02-02 DIAGNOSIS — Z79899 Other long term (current) drug therapy: Secondary | ICD-10-CM | POA: Insufficient documentation

## 2023-02-02 DIAGNOSIS — I10 Essential (primary) hypertension: Secondary | ICD-10-CM | POA: Diagnosis not present

## 2023-02-02 DIAGNOSIS — Z5111 Encounter for antineoplastic chemotherapy: Secondary | ICD-10-CM | POA: Diagnosis present

## 2023-02-02 DIAGNOSIS — C539 Malignant neoplasm of cervix uteri, unspecified: Secondary | ICD-10-CM | POA: Diagnosis not present

## 2023-02-02 DIAGNOSIS — Z5112 Encounter for antineoplastic immunotherapy: Secondary | ICD-10-CM | POA: Diagnosis present

## 2023-02-02 DIAGNOSIS — Z7952 Long term (current) use of systemic steroids: Secondary | ICD-10-CM | POA: Diagnosis not present

## 2023-02-02 DIAGNOSIS — C538 Malignant neoplasm of overlapping sites of cervix uteri: Secondary | ICD-10-CM | POA: Diagnosis not present

## 2023-02-02 DIAGNOSIS — E785 Hyperlipidemia, unspecified: Secondary | ICD-10-CM

## 2023-02-02 LAB — CBC WITH DIFFERENTIAL (CANCER CENTER ONLY)
Abs Immature Granulocytes: 0.02 10*3/uL (ref 0.00–0.07)
Basophils Absolute: 0 10*3/uL (ref 0.0–0.1)
Basophils Relative: 0 %
Eosinophils Absolute: 0 10*3/uL (ref 0.0–0.5)
Eosinophils Relative: 0 %
HCT: 27.8 % — ABNORMAL LOW (ref 36.0–46.0)
Hemoglobin: 9.3 g/dL — ABNORMAL LOW (ref 12.0–15.0)
Immature Granulocytes: 1 %
Lymphocytes Relative: 12 %
Lymphs Abs: 0.4 10*3/uL — ABNORMAL LOW (ref 0.7–4.0)
MCH: 34.7 pg — ABNORMAL HIGH (ref 26.0–34.0)
MCHC: 33.5 g/dL (ref 30.0–36.0)
MCV: 103.7 fL — ABNORMAL HIGH (ref 80.0–100.0)
Monocytes Absolute: 0 10*3/uL — ABNORMAL LOW (ref 0.1–1.0)
Monocytes Relative: 1 %
Neutro Abs: 2.7 10*3/uL (ref 1.7–7.7)
Neutrophils Relative %: 86 %
Platelet Count: 104 10*3/uL — ABNORMAL LOW (ref 150–400)
RBC: 2.68 MIL/uL — ABNORMAL LOW (ref 3.87–5.11)
RDW: 17.2 % — ABNORMAL HIGH (ref 11.5–15.5)
WBC Count: 3.2 10*3/uL — ABNORMAL LOW (ref 4.0–10.5)
nRBC: 0 % (ref 0.0–0.2)

## 2023-02-02 LAB — LIPID PANEL
Cholesterol: 148 mg/dL (ref 0–200)
HDL: 50 mg/dL (ref >40)
LDL Cholesterol: 83 mg/dL (ref 0–99)
Total CHOL/HDL Ratio: 3 RATIO
Triglycerides: 73 mg/dL (ref <150)
VLDL: 15 mg/dL (ref 0–40)

## 2023-02-02 LAB — CMP (CANCER CENTER ONLY)
ALT: 16 U/L (ref 0–44)
AST: 33 U/L (ref 15–41)
Albumin: 4.2 g/dL (ref 3.5–5.0)
Alkaline Phosphatase: 49 U/L (ref 38–126)
Anion gap: 8 (ref 5–15)
BUN: 18 mg/dL (ref 8–23)
CO2: 26 mmol/L (ref 22–32)
Calcium: 9.5 mg/dL (ref 8.9–10.3)
Chloride: 104 mmol/L (ref 98–111)
Creatinine: 0.99 mg/dL (ref 0.44–1.00)
GFR, Estimated: >60 mL/min (ref >60)
Glucose, Bld: 256 mg/dL — ABNORMAL HIGH (ref 70–99)
Potassium: 4.2 mmol/L (ref 3.5–5.1)
Sodium: 138 mmol/L (ref 135–145)
Total Bilirubin: 0.6 mg/dL (ref 0.3–1.2)
Total Protein: 7 g/dL (ref 6.5–8.1)

## 2023-02-02 LAB — TOTAL PROTEIN, URINE DIPSTICK: Protein, ur: 100 mg/dL — AB

## 2023-02-02 MED ORDER — SODIUM CHLORIDE 0.9% FLUSH
10.0000 mL | Freq: Once | INTRAVENOUS | Status: AC
  Administered 2023-02-02: 10 mL

## 2023-02-02 MED ORDER — SODIUM CHLORIDE 0.9 % IV SOLN
150.0000 mg | Freq: Once | INTRAVENOUS | Status: AC
  Administered 2023-02-02: 150 mg via INTRAVENOUS
  Filled 2023-02-02: qty 150

## 2023-02-02 MED ORDER — SODIUM CHLORIDE 0.9 % IV SOLN
105.0000 mg/m2 | Freq: Once | INTRAVENOUS | Status: AC
  Administered 2023-02-02: 210 mg via INTRAVENOUS
  Filled 2023-02-02: qty 35

## 2023-02-02 MED ORDER — SODIUM CHLORIDE 0.9% FLUSH
10.0000 mL | INTRAVENOUS | Status: DC | PRN
  Administered 2023-02-02: 10 mL

## 2023-02-02 MED ORDER — CETIRIZINE HCL 10 MG/ML IV SOLN
10.0000 mg | Freq: Once | INTRAVENOUS | Status: AC
  Administered 2023-02-02: 10 mg via INTRAVENOUS
  Filled 2023-02-02: qty 1

## 2023-02-02 MED ORDER — FAMOTIDINE IN NACL 20-0.9 MG/50ML-% IV SOLN
20.0000 mg | Freq: Once | INTRAVENOUS | Status: AC
  Administered 2023-02-02: 20 mg via INTRAVENOUS
  Filled 2023-02-02: qty 50

## 2023-02-02 MED ORDER — PALONOSETRON HCL INJECTION 0.25 MG/5ML
0.2500 mg | Freq: Once | INTRAVENOUS | Status: AC
  Administered 2023-02-02: 0.25 mg via INTRAVENOUS
  Filled 2023-02-02: qty 5

## 2023-02-02 MED ORDER — BEVACIZUMAB-AWWB CHEMO INJECTION 400 MG/16ML
15.0000 mg/kg | Freq: Once | INTRAVENOUS | Status: DC
Start: 2023-02-02 — End: 2023-02-02

## 2023-02-02 MED ORDER — HEPARIN SOD (PORK) LOCK FLUSH 100 UNIT/ML IV SOLN
500.0000 [IU] | Freq: Once | INTRAVENOUS | Status: AC | PRN
  Administered 2023-02-02: 500 [IU]

## 2023-02-02 MED ORDER — SODIUM CHLORIDE 0.9 % IV SOLN
10.0000 mg | Freq: Once | INTRAVENOUS | Status: AC
  Administered 2023-02-02: 10 mg via INTRAVENOUS
  Filled 2023-02-02: qty 10

## 2023-02-02 MED ORDER — SODIUM CHLORIDE 0.9 % IV SOLN
200.0000 mg | Freq: Once | INTRAVENOUS | Status: AC
  Administered 2023-02-02: 200 mg via INTRAVENOUS
  Filled 2023-02-02: qty 8

## 2023-02-02 MED ORDER — SODIUM CHLORIDE 0.9 % IV SOLN: Freq: Once | INTRAVENOUS | Status: AC

## 2023-02-02 MED ORDER — MAGNESIUM OXIDE -MG SUPPLEMENT 400 (240 MG) MG PO TABS: 400.0000 mg | ORAL_TABLET | Freq: Every day | ORAL | 3 refills | Status: DC

## 2023-02-02 MED ORDER — SODIUM CHLORIDE 0.9 % IV SOLN
328.3000 mg | Freq: Once | INTRAVENOUS | Status: AC
  Administered 2023-02-02: 330 mg via INTRAVENOUS
  Filled 2023-02-02: qty 33

## 2023-02-02 NOTE — Patient Instructions (Signed)
[facility]  Discharge Instructions: Thank you for choosing [facility] to provide your oncology and hematology care.   If you have a lab appointment with the Cancer Center, please go directly to the Cancer Center and check in at the registration area.   Wear comfortable clothing and clothing appropriate for easy access to any Portacath or PICC line.   We strive to give you quality time with your provider. You may need to reschedule your appointment if you arrive late (15 or more minutes).  Arriving late affects you and other patients whose appointments are after yours.  Also, if you miss three or more appointments without notifying the office, you may be dismissed from the clinic at the provider's discretion.      For prescription refill requests, have your pharmacy contact our office and allow 72 hours for refills to be completed.    Today you received the following chemotherapy and/or immunotherapy agents Keytruda, Taxol & Carbo      To help prevent nausea and vomiting after your treatment, we encourage you to take your nausea medication as directed.  BELOW ARE SYMPTOMS THAT SHOULD BE REPORTED IMMEDIATELY: *FEVER GREATER THAN 100.4 F (38 C) OR HIGHER *CHILLS OR SWEATING *NAUSEA AND VOMITING THAT IS NOT CONTROLLED WITH YOUR NAUSEA MEDICATION *UNUSUAL SHORTNESS OF BREATH *UNUSUAL BRUISING OR BLEEDING *URINARY PROBLEMS (pain or burning when urinating, or frequent urination) *BOWEL PROBLEMS (unusual diarrhea, constipation, pain near the anus) TENDERNESS IN MOUTH AND THROAT WITH OR WITHOUT PRESENCE OF ULCERS (sore throat, sores in mouth, or a toothache) UNUSUAL RASH, SWELLING OR PAIN  UNUSUAL VAGINAL DISCHARGE OR ITCHING   Items with * indicate a potential emergency and should be followed up as soon as possible or go to the Emergency Department if any problems should occur.  Please show the CHEMOTHERAPY ALERT CARD or IMMUNOTHERAPY ALERT  CARD at check-in to the Emergency Department and triage nurse.  Should you have questions after your visit or need to cancel or reschedule your appointment, please contact [facility]  Dept: [phone]  and follow the prompts.  Office hours are 8:00 a.m. to 4:30 p.m. Monday - Friday. Please note that voicemails left after 4:00 p.m. may not be returned until the following business day.  We are closed weekends and major holidays. You have access to a nurse at all times for urgent questions. Please call the main number to the clinic Dept: [phone] and follow the prompts.   For any non-urgent questions, you may also contact your provider using MyChart. We now offer e-Visits for anyone 71 and older to request care online for non-urgent symptoms. For details visit [email or web address].   Also download the MyChart app! Go to the app store, search "MyChart", open the app, select [facility], and log in with your MyChart username and password.

## 2023-02-02 NOTE — Assessment & Plan Note (Signed)
Her blood pressure is slightly better controlled She will take atenolol in the morning and amlodipine in the evening Her blood pressure is high today but normal at home We will proceed with treatment without delay

## 2023-02-02 NOTE — Assessment & Plan Note (Signed)
She will complete final chemotherapy today Moving forward, she will get imaging study done next month Assuming everything looks good, she will just proceed with combination of bevacizumab and pembrolizumab only We discussed this treatment approach and she agrees

## 2023-02-02 NOTE — Assessment & Plan Note (Signed)
She has uncontrolled hyperglycemia likely due to steroids She will continue dietary modification and I am hopeful with discontinuation of dexamethasone, her blood sugar will improve

## 2023-02-02 NOTE — Progress Notes (Signed)
[facility] OFFICE PROGRESS NOTE  Patient Care Team: Marletta Lor, NP as PCP - General (Nurse Practitioner)  ASSESSMENT & PLAN:  Cervical cancer [facility]) She will complete final chemotherapy today Moving forward, she will get imaging study done next month Assuming everything looks good, she will just proceed with combination of bevacizumab and pembrolizumab only We discussed this treatment approach and she agrees  Essential hypertension Her blood pressure is slightly better controlled She will take atenolol in the morning and amlodipine in the evening Her blood pressure is high today but normal at home We will proceed with treatment without delay  Diabetes mellitus without complication (HCC) She has uncontrolled hyperglycemia likely due to steroids She will continue dietary modification and I am hopeful with discontinuation of dexamethasone, her blood sugar will improve  Pancytopenia, acquired (HCC) This is due to treatment She does not need transfusion support We will proceed with treatment I anticipate recovery after discontinuation of chemotherapy in the future  Orders Placed This Encounter  Procedures   CT CHEST ABDOMEN PELVIS W CONTRAST    Standing Status:   Future    Standing Expiration Date:   02/02/2024    Order Specific Question:   If indicated for the ordered procedure, I authorize the administration of contrast media per Radiology protocol    Answer:   Yes    Order Specific Question:   Does the patient have a contrast media/X-ray dye allergy?    Answer:   No    Order Specific Question:   Preferred imaging location?    Answer:   [facility]    Order Specific Question:   If indicated for the ordered procedure, I authorize the administration of oral contrast media per Radiology protocol    Answer:   Yes   TSH    Standing Status:   Future    Standing Expiration Date:   02/27/2024   T4    Standing Status:   Future    Standing Expiration Date:    02/27/2024   CBC with Differential (Cancer Center Only)    Standing Status:   Future    Standing Expiration Date:   02/27/2024   CMP (Cancer Center only)    Standing Status:   Future    Standing Expiration Date:   02/27/2024   Total Protein, Urine dipstick    Standing Status:   Future    Standing Expiration Date:   02/27/2024    All questions were answered. The patient knows to call the clinic with any problems, questions or concerns. The total time spent in the appointment was 30 minutes encounter with patients including review of chart and various tests results, discussions about plan of care and coordination of care plan   Artis Delay, MD 02/02/2023 10:33 AM  INTERVAL HISTORY: Please see below for problem oriented charting. she returns for treatment follow-up She tolerated recent treatment well Denies peripheral neuropathy She is not symptomatic from pancytopenia  REVIEW OF SYSTEMS:   Constitutional: Denies fevers, chills or abnormal weight loss Eyes: Denies blurriness of vision Ears, nose, mouth, throat, and face: Denies mucositis or sore throat Respiratory: Denies cough, dyspnea or wheezes Cardiovascular: Denies palpitation, chest discomfort or lower extremity swelling Gastrointestinal:  Denies nausea, heartburn or change in bowel habits Skin: Denies abnormal skin rashes Lymphatics: Denies new lymphadenopathy or easy bruising Neurological:Denies numbness, tingling or new weaknesses Behavioral/Psych: Mood is stable, no new changes  All other systems were reviewed with the patient and are negative.  I have reviewed the past medical history, past surgical history, social history and family history with the patient and they are unchanged from previous note.  ALLERGIES:  is allergic to lisinopril.  MEDICATIONS:  Current Outpatient Medications  Medication Sig Dispense Refill   magnesium oxide (MAG-OX) 400 (240 Mg) MG tablet Take 1 tablet (400 mg total) by mouth daily. 30 tablet  3   amLODipine (NORVASC) 10 MG tablet Take 1 tablet (10 mg total) by mouth daily. 30 tablet 6   atenolol (TENORMIN) 25 MG tablet Take 1 tablet (25 mg total) by mouth daily. Take in the morning 90 tablet 1   diphenhydrAMINE (BENADRYL ALLERGY) 25 MG tablet Take 2 tablets (50 mg total) by mouth every 6 (six) hours as needed for allergies. 30 tablet 0   ezetimibe (ZETIA) 10 MG tablet Take 10 mg by mouth in the morning.     famotidine (PEPCID) 20 MG tablet Take 1 tablet (20 mg total) by mouth daily for 5 days. 5 tablet 0   glimepiride (AMARYL) 1 MG tablet Take 1 mg by mouth daily with breakfast.     lidocaine-prilocaine (EMLA) cream Apply 1 Application topically as needed (for port access). 30 g 3   ondansetron (ZOFRAN) 8 MG tablet Take 1 tablet (8 mg total) by mouth every 8 (eight) hours as needed for nausea or vomiting. Start on the third day after chemotherapy. 30 tablet 1   prochlorperazine (COMPAZINE) 10 MG tablet Take 1 tablet (10 mg total) by mouth every 6 (six) hours as needed for nausea or vomiting. 30 tablet 1   rosuvastatin (CRESTOR) 40 MG tablet Take 40 mg by mouth at bedtime.     sitaGLIPtin (JANUVIA) 100 MG tablet Take 50 mg by mouth in the morning.     traMADol (ULTRAM) 50 MG tablet Take 1 tablet (50 mg total) by mouth every 6 (six) hours as needed (for pain). 60 tablet 0   TYLENOL 500 MG tablet Take 500-1,000 mg by mouth every 6 (six) hours as needed for mild pain or headache.     vitamin B-12 (CYANOCOBALAMIN) 1000 MCG tablet Take 1,000 mcg by mouth in the morning.     No current facility-administered medications for this visit.   Facility-Administered Medications Ordered in Other Visits  Medication Dose Route Frequency Provider Last Rate Last Admin   bevacizumab-awwb (MVASI) 1,400 mg in sodium chloride 0.9 % 100 mL chemo infusion  15 mg/kg (Treatment Plan Recorded) Intravenous Once Bertis Ruddy, Sahana Boyland, MD       CARBOplatin (PARAPLATIN) 330 mg in sodium chloride 0.9 % 100 mL chemo infusion   330 mg Intravenous Once Bertis Ruddy, Marcel Sorter, MD       cetirizine (QUZYTTIR) injection 10 mg  10 mg Intravenous Once Bertis Ruddy, Ananda Caya, MD       heparin lock flush 100 unit/mL  500 Units Intracatheter Once PRN Bertis Ruddy, Skyler Dusing, MD       PACLitaxel (TAXOL) 210 mg in sodium chloride 0.9 % 250 mL chemo infusion (> 80mg /m2)  105 mg/m2 (Treatment Plan Recorded) Intravenous Once Bertis Ruddy, Taelyn Nemes, MD       pembrolizumab (KEYTRUDA) 200 mg in sodium chloride 0.9 % 50 mL chemo infusion  200 mg Intravenous Once Erion Weightman, MD       sodium chloride flush (NS) 0.9 % injection 10 mL  10 mL Intracatheter PRN Artis Delay, MD        SUMMARY OF ONCOLOGIC HISTORY: Oncology History Overview Note  Adenocarcinoma, 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   08/27/2020 Imaging   MRI pelvis Large cervical mass with pelvic adenopathy, at least T2B N1 disease. Question of involvement of posterior urinary bladder (T4) for which additional images have been requested. Patient will return for additional images to answer this question. (Thinner section axial T2 without fat sat and sagittal T2 weighted imaging also without fat saturation.)   Small field-of-view images show that the parametrial extension comes in very close proximity to the RIGHT ureter.   08/30/2020 Initial Diagnosis   Cervical cancer (HCC)   08/30/2020 Cancer Staging   Staging form: Cervix Uteri, AJCC Version 9 - Clinical stage from 08/30/2020: FIGO Stage IVB (rcT2, cN1, pM1) - Signed by Artis Delay, MD on 09/01/2022 Stage prefix: Recurrence   09/01/2020 PET scan   1. Large cervical mass extending into the endometrial canal to the level of the uterine fundus, associated with pelvic adenopathy to the level of the RIGHT common iliac chain. Please refer to MRI for further detail. 2. No signs  of solid organ uptake or metastatic disease to the chest. 3. Subcutaneous nodule with marked increased metabolic activity, correlation with direct clinical inspection is suggested to exclude cutaneous neoplasm or subcutaneous nodule in this location. Complicated sebaceous cyst could also potentially have this appearance. 4. Uptake in the anal canal could likely physiologic. Given the intense nature of the uptake would suggest correlation with direct clinical inspection/examination. 5. Uptake in axillary lymph nodes with activity less than mediastinal blood pool likely small reactive lymph nodes, attention on follow-up. Morphologic features also with benign appearance.   09/16/2020 Procedure   Successful placement of a power injectable Port-A-Cath via the right internal jugular vein. The catheter is ready for immediate use.     09/20/2020 - 10/25/2020 Chemotherapy   She received cisplatin chemo with concurrent radiation       09/22/2020 - 12/08/2020 Radiation Therapy   Radiation Treatment Dates: 09/22/2020 through 12/08/2020   Site: Pelvis Technique: IMRT Total Dose (Gy): 45/45 Dose per Fx (Gy): 1.8 Completed Fx: 25/25 Beam Energies: 6x   Site: pelvic boost Technique: IMRT Total Dose (Gy): 7.2/7.2 Dose per Fx (Gy): 1.8 Completed Fx: 4/4 Beam Energies: 6x   Site: Cervix boost Technique: HDR-brachytherapy Total Dose (Gy): 27.5/27.5 Dose per Fx (Gy): 5.5 Completed Fx: 5/5 Beam Energies: Ir-192     03/17/2021 PET scan   1. Mark positive response to therapy. 2. Resolution of metabolic activity within the uterine body. 3. Resolution of size and metabolic activity of pelvic lymph nodes. 4. No evidence of new disease outside of the pelvis.   08/10/2022 PET scan   1. New hypermetabolic bilateral pulmonary nodules and hypermetabolic cervical and hilar lymph nodes, consistent with metastatic disease. 2. Hypermetabolic focus in the anterior right third costochondral junction without discrete  CT correlate is also compatible with metastatic disease. 3. Prior hysterectomy without suspicious hypermetabolic nodularity along the vaginal cuff. 4.  Aortic Atherosclerosis (ICD10-I70.0).   08/24/2022 Pathology Results   A. SOFT TISSUE NODULE, RIGHT CHEST WALL, NEEDLE CORE BIOPSY:  Poorly differentiated carcinoma.  See comment.   COMMENT:  The carcinoma is characterized by nest of malignant epithelioid cells which focally has features suggestive of squamous differentiation morphologically.  Immunohistochemistry is positive with cytokeratin 7, PAX8, MOC-31 and estrogen receptor.  There is patchy positivity with p16, progesterone receptor and TTF-1.  The tumor is negative  with p40, cytokeratin 5/6, p63, CDX2, cytokeratin 20 and WT-1.  The immunophenotype is consistent with adenocarcinoma and suggestive of a gynecologic primary.    09/08/2022 -  Chemotherapy   Patient is on Treatment Plan : OVARIAN Carboplatin + Paclitaxel + Bevacizumab q21d      11/07/2022 Imaging   1. Decreased size of the pulmonary nodules and mediastinal adenopathy, consistent with treatment response. 2. Similar appearance of the previously hypermetabolic soft tissue subjacent to the right anterior third costochondral cartilage. 3. No convincing evidence of new or progressive disease in the chest, abdomen or pelvis. 4. Rectal wall thickening with stranding in the mesorectal fat and diffuse thickening of the urinary bladder wall, nonspecific but possibly reflecting post radiation change. 5. Nonobstructive right renal stones measure up to 6 mm in the renal pelvis. 6. Colonic diverticulosis without findings of acute diverticulitis. 7.  Aortic Atherosclerosis (ICD10-I70.0).   Malignant neoplasm of cervix (HCC)  09/01/2020 Initial Diagnosis   Malignant neoplasm of cervix (HCC)   09/20/2020 - 10/25/2020 Chemotherapy   She received cisplatin chemo with concurrent radiation       09/22/2020 - 12/08/2020 Radiation Therapy    Radiation Treatment Dates: 09/22/2020 through 12/08/2020   Site: Pelvis Technique: IMRT Total Dose (Gy): 45/45 Dose per Fx (Gy): 1.8 Completed Fx: 25/25 Beam Energies: 6x   Site: pelvic boost Technique: IMRT Total Dose (Gy): 7.2/7.2 Dose per Fx (Gy): 1.8 Completed Fx: 4/4 Beam Energies: 6x   Site: Cervix boost Technique: HDR-brachytherapy Total Dose (Gy): 27.5/27.5 Dose per Fx (Gy): 5.5 Completed Fx: 5/5 Beam Energies: Ir-192   09/08/2022 -  Chemotherapy   Patient is on Treatment Plan : OVARIAN Carboplatin + Paclitaxel + Bevacizumab q21d        PHYSICAL EXAMINATION: ECOG PERFORMANCE STATUS: 1 - Symptomatic but completely ambulatory  Vitals:   02/02/23 0912  BP: (!) 163/80  Pulse: 95  Resp: 18  Temp: (!) 97.5 F (36.4 C)  SpO2: 100%   Filed Weights   02/02/23 0912  Weight: 201 lb 12.8 oz (91.5 kg)    GENERAL:alert, no distress and comfortable  NEURO: alert & oriented x 3 with fluent speech, no focal motor/sensory deficits  LABORATORY DATA:  I have reviewed the data as listed    Component Value Date/Time   NA 138 02/02/2023 0841   K 4.2 02/02/2023 0841   CL 104 02/02/2023 0841   CO2 26 02/02/2023 0841   GLUCOSE 256 (H) 02/02/2023 0841   BUN 18 02/02/2023 0841   CREATININE 0.99 02/02/2023 0841   CALCIUM 9.5 02/02/2023 0841   PROT 7.0 02/02/2023 0841   ALBUMIN 4.2 02/02/2023 0841   AST 33 02/02/2023 0841   ALT 16 02/02/2023 0841   ALKPHOS 49 02/02/2023 0841   BILITOT 0.6 02/02/2023 0841   GFRNONAA >60 02/02/2023 0841    No results found for: "SPEP", "UPEP"  Lab Results  Component Value Date   WBC 3.2 (L) 02/02/2023   NEUTROABS 2.7 02/02/2023   HGB 9.3 (L) 02/02/2023   HCT 27.8 (L) 02/02/2023   MCV 103.7 (H) 02/02/2023   PLT 104 (L) 02/02/2023      Chemistry      Component Value Date/Time   NA 138 02/02/2023 0841   K 4.2 02/02/2023 0841   CL 104 02/02/2023 0841   CO2 26 02/02/2023 0841   BUN 18 02/02/2023 0841   CREATININE 0.99  02/02/2023 0841      Component Value Date/Time   CALCIUM 9.5 02/02/2023 0841   ALKPHOS  49 02/02/2023 0841   AST 33 02/02/2023 0841   ALT 16 02/02/2023 0841   BILITOT 0.6 02/02/2023 0841

## 2023-02-02 NOTE — Progress Notes (Signed)
Urine protein is 100, hold bevacizumab today per Dr Bertis Ruddy.

## 2023-02-02 NOTE — Assessment & Plan Note (Signed)
This is due to treatment She does not need transfusion support We will proceed with treatment I anticipate recovery after discontinuation of chemotherapy in the future

## 2023-02-03 LAB — HEMOGLOBIN A1C
Hgb A1c MFr Bld: 5.1 % (ref 4.8–5.6)
Mean Plasma Glucose: 100 mg/dL

## 2023-02-08 NOTE — Progress Notes (Signed)
Faxed lab results to Marletta Lor, NP per [facility]'s request.

## 2023-02-23 ENCOUNTER — Other Ambulatory Visit: Payer: Self-pay | Admitting: *Deleted

## 2023-02-23 ENCOUNTER — Inpatient Hospital Stay: Payer: Medicare PPO | Attending: Radiation Oncology

## 2023-02-23 ENCOUNTER — Ambulatory Visit (HOSPITAL_COMMUNITY)
Admission: RE | Admit: 2023-02-23 | Discharge: 2023-02-23 | Disposition: A | Discharge status: Home / Self Care | Payer: Medicare PPO | Source: Ambulatory Visit | Attending: Hematology and Oncology | Admitting: Hematology and Oncology

## 2023-02-23 ENCOUNTER — Telehealth: Payer: Self-pay | Admitting: Oncology

## 2023-02-23 ENCOUNTER — Other Ambulatory Visit: Payer: Self-pay

## 2023-02-23 DIAGNOSIS — C539 Malignant neoplasm of cervix uteri, unspecified: Secondary | ICD-10-CM

## 2023-02-23 DIAGNOSIS — C538 Malignant neoplasm of overlapping sites of cervix uteri: Secondary | ICD-10-CM

## 2023-02-23 DIAGNOSIS — D61818 Other pancytopenia: Secondary | ICD-10-CM | POA: Insufficient documentation

## 2023-02-23 DIAGNOSIS — Z923 Personal history of irradiation: Secondary | ICD-10-CM | POA: Insufficient documentation

## 2023-02-23 DIAGNOSIS — I10 Essential (primary) hypertension: Secondary | ICD-10-CM

## 2023-02-23 DIAGNOSIS — E119 Type 2 diabetes mellitus without complications: Secondary | ICD-10-CM | POA: Diagnosis present

## 2023-02-23 DIAGNOSIS — Z5112 Encounter for antineoplastic immunotherapy: Secondary | ICD-10-CM | POA: Insufficient documentation

## 2023-02-23 LAB — CMP (CANCER CENTER ONLY)
ALT: 9 U/L (ref 0–44)
AST: 16 U/L (ref 15–41)
Albumin: 3.9 g/dL (ref 3.5–5.0)
Alkaline Phosphatase: 45 U/L (ref 38–126)
Anion gap: 7 (ref 5–15)
BUN: 14 mg/dL (ref 8–23)
CO2: 27 mmol/L (ref 22–32)
Calcium: 9.5 mg/dL (ref 8.9–10.3)
Chloride: 105 mmol/L (ref 98–111)
Creatinine: 0.8 mg/dL (ref 0.44–1.00)
GFR, Estimated: >60 mL/min (ref >60)
Glucose, Bld: 93 mg/dL (ref 70–99)
Potassium: 3.7 mmol/L (ref 3.5–5.1)
Sodium: 139 mmol/L (ref 135–145)
Total Bilirubin: 0.7 mg/dL (ref 0.3–1.2)
Total Protein: 6.6 g/dL (ref 6.5–8.1)

## 2023-02-23 LAB — CBC WITH DIFFERENTIAL (CANCER CENTER ONLY)
Abs Immature Granulocytes: 0.02 10*3/uL (ref 0.00–0.07)
Basophils Absolute: 0 10*3/uL (ref 0.0–0.1)
Basophils Relative: 1 %
Eosinophils Absolute: 0 10*3/uL (ref 0.0–0.5)
Eosinophils Relative: 1 %
HCT: 27.1 % — ABNORMAL LOW (ref 36.0–46.0)
Hemoglobin: 9.1 g/dL — ABNORMAL LOW (ref 12.0–15.0)
Immature Granulocytes: 1 %
Lymphocytes Relative: 34 %
Lymphs Abs: 1.4 10*3/uL (ref 0.7–4.0)
MCH: 34.7 pg — ABNORMAL HIGH (ref 26.0–34.0)
MCHC: 33.6 g/dL (ref 30.0–36.0)
MCV: 103.4 fL — ABNORMAL HIGH (ref 80.0–100.0)
Monocytes Absolute: 0.5 10*3/uL (ref 0.1–1.0)
Monocytes Relative: 12 %
Neutro Abs: 2.1 10*3/uL (ref 1.7–7.7)
Neutrophils Relative %: 51 %
Platelet Count: 119 10*3/uL — ABNORMAL LOW (ref 150–400)
RBC: 2.62 MIL/uL — ABNORMAL LOW (ref 3.87–5.11)
RDW: 17.5 % — ABNORMAL HIGH (ref 11.5–15.5)
WBC Count: 4 10*3/uL (ref 4.0–10.5)
nRBC: 0 % (ref 0.0–0.2)

## 2023-02-23 MED ORDER — IOHEXOL 300 MG/ML  SOLN
100.0000 mL | Freq: Once | INTRAMUSCULAR | Status: AC | PRN
  Administered 2023-02-23: 100 mL via INTRAVENOUS

## 2023-02-23 MED ORDER — SODIUM CHLORIDE 0.9% FLUSH
10.0000 mL | Freq: Once | INTRAVENOUS | Status: AC
  Administered 2023-02-23: 10 mL

## 2023-02-23 MED ORDER — HEPARIN SOD (PORK) LOCK FLUSH 100 UNIT/ML IV SOLN
500.0000 [IU] | Freq: Once | INTRAVENOUS | Status: AC
  Administered 2023-02-23: 500 [IU] via INTRAVENOUS

## 2023-02-23 NOTE — Telephone Encounter (Signed)
Misty Deleon called and verified that her labs that she will have drawn today can be used for her chemotherapy on Tuesday.  Advised the lab results are good for 7 days so they are good for her chemotherapy on Tuesday.  She verbalized understanding and agreement.

## 2023-02-26 NOTE — Assessment & Plan Note (Signed)
I have reviewed imaging studies with the patient She has excellent response to therapy with near complete resolution of pulmonary metastasis We discussed risk and benefits of discontinuation of chemotherapy and to continue on maintenance bevacizumab with pembrolizumab only and she is in agreement I plan to repeat imaging study again in September

## 2023-02-27 ENCOUNTER — Inpatient Hospital Stay: Payer: Medicare PPO

## 2023-02-27 ENCOUNTER — Encounter: Payer: Self-pay | Admitting: Hematology and Oncology

## 2023-02-27 ENCOUNTER — Inpatient Hospital Stay: Payer: Medicare PPO | Admitting: Hematology and Oncology

## 2023-02-27 ENCOUNTER — Other Ambulatory Visit: Payer: Self-pay

## 2023-02-27 VITALS — BP 145/60 | HR 87 | Temp 98.8°F | Resp 18 | Ht 62.0 in | Wt 201.6 lb

## 2023-02-27 VITALS — BP 146/72

## 2023-02-27 DIAGNOSIS — C538 Malignant neoplasm of overlapping sites of cervix uteri: Secondary | ICD-10-CM

## 2023-02-27 DIAGNOSIS — C539 Malignant neoplasm of cervix uteri, unspecified: Secondary | ICD-10-CM

## 2023-02-27 DIAGNOSIS — Z5112 Encounter for antineoplastic immunotherapy: Secondary | ICD-10-CM | POA: Diagnosis present

## 2023-02-27 DIAGNOSIS — N183 Chronic kidney disease, stage 3 unspecified: Secondary | ICD-10-CM | POA: Diagnosis not present

## 2023-02-27 DIAGNOSIS — Z923 Personal history of irradiation: Secondary | ICD-10-CM | POA: Diagnosis not present

## 2023-02-27 DIAGNOSIS — D61818 Other pancytopenia: Secondary | ICD-10-CM

## 2023-02-27 DIAGNOSIS — E119 Type 2 diabetes mellitus without complications: Secondary | ICD-10-CM

## 2023-02-27 LAB — TOTAL PROTEIN, URINE DIPSTICK: Protein, ur: 30 mg/dL — AB

## 2023-02-27 MED ORDER — SODIUM CHLORIDE 0.9% FLUSH
10.0000 mL | INTRAVENOUS | Status: DC | PRN
  Administered 2023-02-27: 10 mL

## 2023-02-27 MED ORDER — HEPARIN SOD (PORK) LOCK FLUSH 100 UNIT/ML IV SOLN
500.0000 [IU] | Freq: Once | INTRAVENOUS | Status: AC | PRN
  Administered 2023-02-27: 500 [IU]

## 2023-02-27 MED ORDER — SODIUM CHLORIDE 0.9 % IV SOLN: Freq: Once | INTRAVENOUS | Status: AC

## 2023-02-27 MED ORDER — SODIUM CHLORIDE 0.9 % IV SOLN
200.0000 mg | Freq: Once | INTRAVENOUS | Status: AC
  Administered 2023-02-27: 200 mg via INTRAVENOUS
  Filled 2023-02-27: qty 200

## 2023-02-27 MED ORDER — SODIUM CHLORIDE 0.9 % IV SOLN
15.0000 mg/kg | Freq: Once | INTRAVENOUS | Status: AC
  Administered 2023-02-27: 1400 mg via INTRAVENOUS
  Filled 2023-02-27: qty 8

## 2023-02-27 NOTE — Progress Notes (Signed)
[facility] OFFICE PROGRESS NOTE  Patient Care Team: Marletta Lor, NP as PCP - General (Nurse Practitioner)  ASSESSMENT & PLAN:  Cervical cancer [facility]) I have reviewed imaging studies with the patient She has excellent response to therapy with near complete resolution of pulmonary metastasis We discussed risk and benefits of discontinuation of chemotherapy and to continue on maintenance bevacizumab with pembrolizumab only and she is in agreement I plan to repeat imaging study again in September  Chronic kidney disease (CKD), stage III (moderate) (HCC) She has intermittent elevated serum creatinine She has multiple risk factors including diabetes and hypertension She will continue hydration and risk factor modification  Diabetes mellitus without complication (HCC) Recent blood sugar is better controlled Her proteinuria has also improved She will continue medical management and lifestyle changes  Pancytopenia, acquired (HCC) This is due to treatment She does not need transfusion support We will proceed with treatment I anticipate recovery after discontinuation of chemotherapy in the future  Orders Placed This Encounter  Procedures   Total Protein, Urine dipstick    Standing Status:   Future    Standing Expiration Date:   03/19/2024   TSH    Standing Status:   Future    Standing Expiration Date:   03/19/2024   T4    Standing Status:   Future    Standing Expiration Date:   03/19/2024   CBC with Differential (Cancer Center Only)    Standing Status:   Future    Standing Expiration Date:   03/19/2024   CMP (Cancer Center only)    Standing Status:   Future    Standing Expiration Date:   03/19/2024   Total Protein, Urine dipstick    Standing Status:   Future    Standing Expiration Date:   04/09/2024   TSH    Standing Status:   Future    Standing Expiration Date:   04/09/2024   T4    Standing Status:   Future    Standing Expiration Date:   04/09/2024   CBC with  Differential (Cancer Center Only)    Standing Status:   Future    Standing Expiration Date:   04/09/2024   CMP (Cancer Center only)    Standing Status:   Future    Standing Expiration Date:   04/09/2024    All questions were answered. The patient knows to call the clinic with any problems, questions or concerns. The total time spent in the appointment was 30 minutes encounter with patients including review of chart and various tests results, discussions about plan of care and coordination of care plan   Artis Delay, MD 02/27/2023 11:44 AM  INTERVAL HISTORY: Please see below for problem oriented charting. she returns for treatment follow-up She is doing well overall She tolerated last cycle therapy well Her blood pressure is well-controlled at home We reviewed imaging studies and discussed future treatment plan  REVIEW OF SYSTEMS:   Constitutional: Denies fevers, chills or abnormal weight loss Eyes: Denies blurriness of vision Ears, nose, mouth, throat, and face: Denies mucositis or sore throat Respiratory: Denies cough, dyspnea or wheezes Cardiovascular: Denies palpitation, chest discomfort or lower extremity swelling Gastrointestinal:  Denies nausea, heartburn or change in bowel habits Skin: Denies abnormal skin rashes Lymphatics: Denies new lymphadenopathy or easy bruising Neurological:Denies numbness, tingling or new weaknesses Behavioral/Psych: Mood is stable, no new changes  All other systems were reviewed with the patient and are negative.  I have reviewed the past medical history, past  surgical history, social history and family history with the patient and they are unchanged from previous note.  ALLERGIES:  is allergic to lisinopril.  MEDICATIONS:  Current Outpatient Medications  Medication Sig Dispense Refill   amLODipine (NORVASC) 10 MG tablet Take 1 tablet (10 mg total) by mouth daily. 30 tablet 6   atenolol (TENORMIN) 25 MG tablet Take 1 tablet (25 mg total) by  mouth daily. Take in the morning 90 tablet 1   diphenhydrAMINE (BENADRYL ALLERGY) 25 MG tablet Take 2 tablets (50 mg total) by mouth every 6 (six) hours as needed for allergies. 30 tablet 0   ezetimibe (ZETIA) 10 MG tablet Take 10 mg by mouth in the morning.     famotidine (PEPCID) 20 MG tablet Take 1 tablet (20 mg total) by mouth daily for 5 days. 5 tablet 0   glimepiride (AMARYL) 1 MG tablet Take 1 mg by mouth daily with breakfast.     lidocaine-prilocaine (EMLA) cream Apply 1 Application topically as needed (for port access). 30 g 3   magnesium oxide (MAG-OX) 400 (240 Mg) MG tablet Take 1 tablet (400 mg total) by mouth daily. 30 tablet 3   ondansetron (ZOFRAN) 8 MG tablet Take 1 tablet (8 mg total) by mouth every 8 (eight) hours as needed for nausea or vomiting. Start on the third day after chemotherapy. 30 tablet 1   prochlorperazine (COMPAZINE) 10 MG tablet Take 1 tablet (10 mg total) by mouth every 6 (six) hours as needed for nausea or vomiting. 30 tablet 1   rosuvastatin (CRESTOR) 40 MG tablet Take 40 mg by mouth at bedtime.     sitaGLIPtin (JANUVIA) 100 MG tablet Take 50 mg by mouth in the morning.     traMADol (ULTRAM) 50 MG tablet Take 1 tablet (50 mg total) by mouth every 6 (six) hours as needed (for pain). 60 tablet 0   TYLENOL 500 MG tablet Take 500-1,000 mg by mouth every 6 (six) hours as needed for mild pain or headache.     vitamin B-12 (CYANOCOBALAMIN) 1000 MCG tablet Take 1,000 mcg by mouth in the morning.     No current facility-administered medications for this visit.   Facility-Administered Medications Ordered in Other Visits  Medication Dose Route Frequency Provider Last Rate Last Admin   bevacizumab-awwb (MVASI) 1,400 mg in sodium chloride 0.9 % 100 mL chemo infusion  15 mg/kg (Treatment Plan Recorded) Intravenous Once Artis Delay, MD 312 mL/hr at 02/27/23 1130 1,400 mg at 02/27/23 1130   heparin lock flush 100 unit/mL  500 Units Intracatheter Once PRN Bertis Ruddy, Maryann Mccall, MD        sodium chloride flush (NS) 0.9 % injection 10 mL  10 mL Intracatheter PRN Artis Delay, MD        SUMMARY OF ONCOLOGIC HISTORY: Oncology History Overview Note  Adenocarcinoma, 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   08/27/2020 Imaging   MRI pelvis Large cervical mass with pelvic adenopathy, at least T2B N1 disease. Question of involvement of posterior urinary bladder (T4) for which additional images have been requested. Patient will return for additional images to answer this question. (Thinner section axial T2 without fat sat and sagittal T2 weighted imaging also without fat saturation.)   Small field-of-view images show that the parametrial extension comes in very close proximity to the RIGHT ureter.  08/30/2020 Initial Diagnosis   Cervical cancer (HCC)   08/30/2020 Cancer Staging   Staging form: Cervix Uteri, AJCC Version 9 - Clinical stage from 08/30/2020: FIGO Stage IVB (rcT2, cN1, pM1) - Signed by Artis Delay, MD on 09/01/2022 Stage prefix: Recurrence   09/01/2020 PET scan   1. Large cervical mass extending into the endometrial canal to the level of the uterine fundus, associated with pelvic adenopathy to the level of the RIGHT common iliac chain. Please refer to MRI for further detail. 2. No signs of solid organ uptake or metastatic disease to the chest. 3. Subcutaneous nodule with marked increased metabolic activity, correlation with direct clinical inspection is suggested to exclude cutaneous neoplasm or subcutaneous nodule in this location. Complicated sebaceous cyst could also potentially have this appearance. 4. Uptake in the anal canal could likely physiologic. Given the intense nature of the uptake would suggest correlation with direct clinical inspection/examination. 5. Uptake in  axillary lymph nodes with activity less than mediastinal blood pool likely small reactive lymph nodes, attention on follow-up. Morphologic features also with benign appearance.   09/16/2020 Procedure   Successful placement of a power injectable Port-A-Cath via the right internal jugular vein. The catheter is ready for immediate use.     09/20/2020 - 10/25/2020 Chemotherapy   She received cisplatin chemo with concurrent radiation       09/22/2020 - 12/08/2020 Radiation Therapy   Radiation Treatment Dates: 09/22/2020 through 12/08/2020   Site: Pelvis Technique: IMRT Total Dose (Gy): 45/45 Dose per Fx (Gy): 1.8 Completed Fx: 25/25 Beam Energies: 6x   Site: pelvic boost Technique: IMRT Total Dose (Gy): 7.2/7.2 Dose per Fx (Gy): 1.8 Completed Fx: 4/4 Beam Energies: 6x   Site: Cervix boost Technique: HDR-brachytherapy Total Dose (Gy): 27.5/27.5 Dose per Fx (Gy): 5.5 Completed Fx: 5/5 Beam Energies: Ir-192     03/17/2021 PET scan   1. Mark positive response to therapy. 2. Resolution of metabolic activity within the uterine body. 3. Resolution of size and metabolic activity of pelvic lymph nodes. 4. No evidence of new disease outside of the pelvis.   08/10/2022 PET scan   1. New hypermetabolic bilateral pulmonary nodules and hypermetabolic cervical and hilar lymph nodes, consistent with metastatic disease. 2. Hypermetabolic focus in the anterior right third costochondral junction without discrete CT correlate is also compatible with metastatic disease. 3. Prior hysterectomy without suspicious hypermetabolic nodularity along the vaginal cuff. 4.  Aortic Atherosclerosis (ICD10-I70.0).   08/24/2022 Pathology Results   A. SOFT TISSUE NODULE, RIGHT CHEST WALL, NEEDLE CORE BIOPSY:  Poorly differentiated carcinoma.  See comment.   COMMENT:  The carcinoma is characterized by nest of malignant epithelioid cells which focally has features suggestive of squamous differentiation  morphologically.  Immunohistochemistry is positive with cytokeratin 7, PAX8, MOC-31 and estrogen receptor.  There is patchy positivity with p16, progesterone receptor and TTF-1.  The tumor is negative with p40, cytokeratin 5/6, p63, CDX2, cytokeratin 20 and WT-1.  The immunophenotype is consistent with adenocarcinoma and suggestive of a gynecologic primary.    09/08/2022 -  Chemotherapy   Patient is on Treatment Plan : OVARIAN Carboplatin + Paclitaxel + Bevacizumab q21d      11/07/2022 Imaging   1. Decreased size of the pulmonary nodules and mediastinal adenopathy, consistent with treatment response. 2. Similar appearance of the previously hypermetabolic soft tissue subjacent to the right anterior third costochondral cartilage. 3. No convincing evidence of new or progressive disease in the chest, abdomen or pelvis. 4. Rectal wall thickening with stranding in  the mesorectal fat and diffuse thickening of the urinary bladder wall, nonspecific but possibly reflecting post radiation change. 5. Nonobstructive right renal stones measure up to 6 mm in the renal pelvis. 6. Colonic diverticulosis without findings of acute diverticulitis. 7.  Aortic Atherosclerosis (ICD10-I70.0).   02/26/2023 Imaging   CT CHEST ABDOMEN PELVIS W CONTRAST  Result Date: 02/24/2023 CLINICAL DATA:  Cervical cancer, assess treatment response * Tracking Code: BO * EXAM: CT CHEST, ABDOMEN, AND PELVIS WITH CONTRAST TECHNIQUE: Multidetector CT imaging of the chest, abdomen and pelvis was performed following the standard protocol during bolus administration of intravenous contrast. RADIATION DOSE REDUCTION: This exam was performed according to the departmental dose-optimization program which includes automated exposure control, adjustment of the mA and/or kV according to patient size and/or use of iterative reconstruction technique. CONTRAST:  OMNIPAQUE IOHEXOL 300 MG/ML  SOLN COMPARISON:  CT chest abdomen pelvis, 11/07/2022, PET-CT,  08/10/2022 FINDINGS: CT CHEST FINDINGS Cardiovascular: Right chest port catheter. Normal heart size. No pericardial effusion. Mediastinum/Nodes: No persistently enlarged mediastinal, hilar, or axillary lymph nodes. Benign calcified left hilar lymph nodes. Small hiatal hernia. Thyroid gland, trachea, and esophagus demonstrate no significant findings. Lungs/Pleura: Near complete resolution of previously seen bilateral pulmonary nodules, only tiny treated residual remaining, for example a 0.5 cm irregular nodule of the peripheral right apex (series 4, image 28) and a linear residua in the medial left upper lobe (series 4, image 42) benign calcified nodule of the anterior left upper lobe, for which no further follow-up or characterization is required (series 4, image 46). Scarring and volume loss of the left lung base (series 4, image 105). No pleural effusion or pneumothorax. Musculoskeletal: No chest wall abnormality. Previously FDG avid soft tissue nodule in the anterior right third chondral cartilage is not well appreciated by today's CT (series 2, image 24). No acute osseous findings. CT ABDOMEN PELVIS FINDINGS Hepatobiliary: No solid liver abnormality is seen. No gallstones, gallbladder wall thickening, or biliary dilatation. Pancreas: Unremarkable. No pancreatic ductal dilatation or surrounding inflammatory changes. Spleen: Normal in size. Punctuate granulomatous calcifications of the spleen. Adrenals/Urinary Tract: Adrenal glands are unremarkable. Nonobstructive calculi of the superior pole of the right kidney as well as the right renal pelvis, renal pelvic calculus measuring 0.8 cm (series 2, image 66). The left kidney is normal, without renal calculi, solid lesion, or hydronephrosis. Unchanged diffuse bladder wall thickening with adjacent fat stranding (series 6, image 106) Stomach/Bowel: Stomach is within normal limits. Appendix is not clearly visualized and may be surgically absent. No evidence of bowel wall  thickening, distention, or inflammatory changes. Moderate burden of stool throughout the colon and rectum. Sigmoid diverticulosis. Unchanged long segment wall thickening involving the distal sigmoid colon and rectum, particularly with circumferential wall thickening and perirectal fat spiculation of the rectum (series 2, image 101) Vascular/Lymphatic: Aortic atherosclerosis. No enlarged abdominal or pelvic lymph nodes. Reproductive: No directly visualized cervical mass. Other: No abdominal wall hernia or abnormality. No ascites. Musculoskeletal: No acute osseous findings. Unchanged sclerotic inferior endplate deformity of L5 (series 6, image 100). IMPRESSION: 1. No directly visualized cervical mass. 2. Near complete resolution of previously seen pulmonary nodules, with only small residua, consistent with continued treatment response of pulmonary metastatic disease. 3. No persistently enlarged mediastinal lymph nodes, consistent with sustained treatment response of nodal metastatic disease. 4. No appreciable CT correlate of a previously seen FDG avid soft tissue nodule within the anterior right third costochondral cartilage. 5. Unchanged long segment wall thickening involving the distal sigmoid colon and  rectum, particularly with circumferential wall thickening and perirectal fat spiculation of the rectum, as well as diffuse bladder wall thickening. 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 Attention on follow-up. 6. Nonobstructive right nephrolithiasis. Aortic Atherosclerosis (ICD10-I70.0). Electronically Signed   By: Jearld Lesch M.D.   On: 02/24/2023 16:31      Malignant neoplasm of cervix (HCC)  09/01/2020 Initial Diagnosis   Malignant neoplasm of cervix (HCC)   09/20/2020 - 10/25/2020 Chemotherapy   She received cisplatin chemo with concurrent radiation       09/22/2020 - 12/08/2020 Radiation Therapy   Radiation Treatment Dates: 09/22/2020 through 12/08/2020   Site:  Pelvis Technique: IMRT Total Dose (Gy): 45/45 Dose per Fx (Gy): 1.8 Completed Fx: 25/25 Beam Energies: 6x   Site: pelvic boost Technique: IMRT Total Dose (Gy): 7.2/7.2 Dose per Fx (Gy): 1.8 Completed Fx: 4/4 Beam Energies: 6x   Site: Cervix boost Technique: HDR-brachytherapy Total Dose (Gy): 27.5/27.5 Dose per Fx (Gy): 5.5 Completed Fx: 5/5 Beam Energies: Ir-192   09/08/2022 -  Chemotherapy   Patient is on Treatment Plan : OVARIAN Carboplatin + Paclitaxel + Bevacizumab q21d        PHYSICAL EXAMINATION: ECOG PERFORMANCE STATUS: 1 - Symptomatic but completely ambulatory  Vitals:   02/27/23 0955  BP: (!) 145/60  Pulse: 87  Resp: 18  Temp: 98.8 F (37.1 C)  SpO2: 100%   Filed Weights   02/27/23 0955  Weight: 201 lb 9.6 oz (91.4 kg)    GENERAL:alert, no distress and comfortable NEURO: alert & oriented x 3 with fluent speech, no focal motor/sensory deficits  LABORATORY DATA:  I have reviewed the data as listed    Component Value Date/Time   NA 139 02/23/2023 1534   K 3.7 02/23/2023 1534   CL 105 02/23/2023 1534   CO2 27 02/23/2023 1534   GLUCOSE 93 02/23/2023 1534   BUN 14 02/23/2023 1534   CREATININE 0.80 02/23/2023 1534   CALCIUM 9.5 02/23/2023 1534   PROT 6.6 02/23/2023 1534   ALBUMIN 3.9 02/23/2023 1534   AST 16 02/23/2023 1534   ALT 9 02/23/2023 1534   ALKPHOS 45 02/23/2023 1534   BILITOT 0.7 02/23/2023 1534   GFRNONAA >60 02/23/2023 1534    No results found for: "SPEP", "UPEP"  Lab Results  Component Value Date   WBC 4.0 02/23/2023   NEUTROABS 2.1 02/23/2023   HGB 9.1 (L) 02/23/2023   HCT 27.1 (L) 02/23/2023   MCV 103.4 (H) 02/23/2023   PLT 119 (L) 02/23/2023      Chemistry      Component Value Date/Time   NA 139 02/23/2023 1534   K 3.7 02/23/2023 1534   CL 105 02/23/2023 1534   CO2 27 02/23/2023 1534   BUN 14 02/23/2023 1534   CREATININE 0.80 02/23/2023 1534      Component Value Date/Time   CALCIUM 9.5 02/23/2023 1534    ALKPHOS 45 02/23/2023 1534   AST 16 02/23/2023 1534   ALT 9 02/23/2023 1534   BILITOT 0.7 02/23/2023 1534       RADIOGRAPHIC STUDIES: I have reviewed multiple imaging studies with the patient I have personally reviewed the radiological images as listed and agreed with the findings in the report. CT CHEST ABDOMEN PELVIS W CONTRAST  Result Date: 02/24/2023 CLINICAL DATA:  Cervical cancer, assess treatment response * Tracking Code: BO * EXAM: CT CHEST, ABDOMEN, AND PELVIS WITH CONTRAST TECHNIQUE: Multidetector CT imaging of the chest, abdomen and pelvis was performed following the  standard protocol during bolus administration of intravenous contrast. RADIATION DOSE REDUCTION: This exam was performed according to the departmental dose-optimization program which includes automated exposure control, adjustment of the mA and/or kV according to patient size and/or use of iterative reconstruction technique. CONTRAST:  OMNIPAQUE IOHEXOL 300 MG/ML  SOLN COMPARISON:  CT chest abdomen pelvis, 11/07/2022, PET-CT, 08/10/2022 FINDINGS: CT CHEST FINDINGS Cardiovascular: Right chest port catheter. Normal heart size. No pericardial effusion. Mediastinum/Nodes: No persistently enlarged mediastinal, hilar, or axillary lymph nodes. Benign calcified left hilar lymph nodes. Small hiatal hernia. Thyroid gland, trachea, and esophagus demonstrate no significant findings. Lungs/Pleura: Near complete resolution of previously seen bilateral pulmonary nodules, only tiny treated residual remaining, for example a 0.5 cm irregular nodule of the peripheral right apex (series 4, image 28) and a linear residua in the medial left upper lobe (series 4, image 42) benign calcified nodule of the anterior left upper lobe, for which no further follow-up or characterization is required (series 4, image 46). Scarring and volume loss of the left lung base (series 4, image 105). No pleural effusion or pneumothorax. Musculoskeletal: No chest wall  abnormality. Previously FDG avid soft tissue nodule in the anterior right third chondral cartilage is not well appreciated by today's CT (series 2, image 24). No acute osseous findings. CT ABDOMEN PELVIS FINDINGS Hepatobiliary: No solid liver abnormality is seen. No gallstones, gallbladder wall thickening, or biliary dilatation. Pancreas: Unremarkable. No pancreatic ductal dilatation or surrounding inflammatory changes. Spleen: Normal in size. Punctuate granulomatous calcifications of the spleen. Adrenals/Urinary Tract: Adrenal glands are unremarkable. Nonobstructive calculi of the superior pole of the right kidney as well as the right renal pelvis, renal pelvic calculus measuring 0.8 cm (series 2, image 66). The left kidney is normal, without renal calculi, solid lesion, or hydronephrosis. Unchanged diffuse bladder wall thickening with adjacent fat stranding (series 6, image 106) Stomach/Bowel: Stomach is within normal limits. Appendix is not clearly visualized and may be surgically absent. No evidence of bowel wall thickening, distention, or inflammatory changes. Moderate burden of stool throughout the colon and rectum. Sigmoid diverticulosis. Unchanged long segment wall thickening involving the distal sigmoid colon and rectum, particularly with circumferential wall thickening and perirectal fat spiculation of the rectum (series 2, image 101) Vascular/Lymphatic: Aortic atherosclerosis. No enlarged abdominal or pelvic lymph nodes. Reproductive: No directly visualized cervical mass. Other: No abdominal wall hernia or abnormality. No ascites. Musculoskeletal: No acute osseous findings. Unchanged sclerotic inferior endplate deformity of L5 (series 6, image 100). IMPRESSION: 1. No directly visualized cervical mass. 2. Near complete resolution of previously seen pulmonary nodules, with only small residua, consistent with continued treatment response of pulmonary metastatic disease. 3. No persistently enlarged  mediastinal lymph nodes, consistent with sustained treatment response of nodal metastatic disease. 4. No appreciable CT correlate of a previously seen FDG avid soft tissue nodule within the anterior right third costochondral cartilage. 5. Unchanged long segment wall thickening involving the distal sigmoid colon and rectum, particularly with circumferential wall thickening and perirectal fat spiculation of the rectum, as well as diffuse bladder wall thickening. 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 Attention on follow-up. 6. Nonobstructive right nephrolithiasis. Aortic Atherosclerosis (ICD10-I70.0). Electronically Signed   By: Jearld Lesch M.D.   On: 02/24/2023 16:31

## 2023-02-27 NOTE — Patient Instructions (Signed)
[place] [facility]  Discharge Instructions: Thank you for choosing [facility] to provide your oncology and hematology care.   If you have a lab appointment with the Cancer Center, please go directly to the Cancer Center and check in at the registration area.   Wear comfortable clothing and clothing appropriate for easy access to any Portacath or PICC line.   We strive to give you quality time with your provider. You may need to reschedule your appointment if you arrive late (15 or more minutes).  Arriving late affects you and other patients whose appointments are after yours.  Also, if you miss three or more appointments without notifying the office, you may be dismissed from the clinic at the provider's discretion.      For prescription refill requests, have your pharmacy contact our office and allow 72 hours for refills to be completed.    Today you received the following chemotherapy and/or immunotherapy agents: Keytruda, Bevacizumab.       To help prevent nausea and vomiting after your treatment, we encourage you to take your nausea medication as directed.  BELOW ARE SYMPTOMS THAT SHOULD BE REPORTED IMMEDIATELY: *FEVER GREATER THAN 100.4 F (38 C) OR HIGHER *CHILLS OR SWEATING *NAUSEA AND VOMITING THAT IS NOT CONTROLLED WITH YOUR NAUSEA MEDICATION *UNUSUAL SHORTNESS OF BREATH *UNUSUAL BRUISING OR BLEEDING *URINARY PROBLEMS (pain or burning when urinating, or frequent urination) *BOWEL PROBLEMS (unusual diarrhea, constipation, pain near the anus) TENDERNESS IN MOUTH AND THROAT WITH OR WITHOUT PRESENCE OF ULCERS (sore throat, sores in mouth, or a toothache) UNUSUAL RASH, SWELLING OR PAIN  UNUSUAL VAGINAL DISCHARGE OR ITCHING   Items with * indicate a potential emergency and should be followed up as soon as possible or go to the Emergency Department if any problems should occur.  Please show the CHEMOTHERAPY ALERT CARD or IMMUNOTHERAPY ALERT  CARD at check-in to the Emergency Department and triage nurse.  Should you have questions after your visit or need to cancel or reschedule your appointment, please contact [place] [facility]  Dept: [phone]  and follow the prompts.  Office hours are 8:00 a.m. to 4:30 p.m. Monday - Friday. Please note that voicemails left after 4:00 p.m. may not be returned until the following business day.  We are closed weekends and major holidays. You have access to a nurse at all times for urgent questions. Please call the main number to the clinic Dept: [phone] and follow the prompts.   For any non-urgent questions, you may also contact your provider using MyChart. We now offer e-Visits for anyone 25 and older to request care online for non-urgent symptoms. For details visit [email or web address].   Also download the MyChart app! Go to the app store, search "MyChart", open the app, select [place], and log in with your MyChart username and password.

## 2023-02-27 NOTE — Assessment & Plan Note (Signed)
This is due to treatment She does not need transfusion support We will proceed with treatment I anticipate recovery after discontinuation of chemotherapy in the future 

## 2023-02-27 NOTE — Assessment & Plan Note (Signed)
Recent blood sugar is better controlled Her proteinuria has also improved She will continue medical management and lifestyle changes

## 2023-02-27 NOTE — Assessment & Plan Note (Signed)
She has intermittent elevated serum creatinine She has multiple risk factors including diabetes and hypertension She will continue hydration and risk factor modification

## 2023-03-01 ENCOUNTER — Other Ambulatory Visit: Payer: Self-pay

## 2023-03-02 ENCOUNTER — Other Ambulatory Visit: Payer: Self-pay

## 2023-03-20 ENCOUNTER — Inpatient Hospital Stay: Payer: Medicare PPO | Attending: Radiation Oncology

## 2023-03-20 ENCOUNTER — Inpatient Hospital Stay: Payer: Medicare PPO

## 2023-03-20 ENCOUNTER — Inpatient Hospital Stay: Payer: Medicare PPO | Admitting: Hematology and Oncology

## 2023-03-20 ENCOUNTER — Encounter: Payer: Self-pay | Admitting: Hematology and Oncology

## 2023-03-20 ENCOUNTER — Other Ambulatory Visit: Payer: Self-pay

## 2023-03-20 VITALS — BP 165/70 | HR 86 | Resp 18 | Ht 62.0 in | Wt 201.4 lb

## 2023-03-20 VITALS — BP 153/64 | HR 60 | Resp 16

## 2023-03-20 DIAGNOSIS — Z923 Personal history of irradiation: Secondary | ICD-10-CM | POA: Insufficient documentation

## 2023-03-20 DIAGNOSIS — C539 Malignant neoplasm of cervix uteri, unspecified: Secondary | ICD-10-CM

## 2023-03-20 DIAGNOSIS — D61818 Other pancytopenia: Secondary | ICD-10-CM | POA: Insufficient documentation

## 2023-03-20 DIAGNOSIS — R809 Proteinuria, unspecified: Secondary | ICD-10-CM | POA: Diagnosis not present

## 2023-03-20 DIAGNOSIS — C538 Malignant neoplasm of overlapping sites of cervix uteri: Secondary | ICD-10-CM

## 2023-03-20 DIAGNOSIS — Z5112 Encounter for antineoplastic immunotherapy: Secondary | ICD-10-CM | POA: Diagnosis present

## 2023-03-20 DIAGNOSIS — I10 Essential (primary) hypertension: Secondary | ICD-10-CM | POA: Diagnosis not present

## 2023-03-20 DIAGNOSIS — Z79899 Other long term (current) drug therapy: Secondary | ICD-10-CM | POA: Insufficient documentation

## 2023-03-20 LAB — CMP (CANCER CENTER ONLY)
ALT: 9 U/L (ref 0–44)
AST: 16 U/L (ref 15–41)
Albumin: 3.9 g/dL (ref 3.5–5.0)
Alkaline Phosphatase: 42 U/L (ref 38–126)
Anion gap: 7 (ref 5–15)
BUN: 19 mg/dL (ref 8–23)
CO2: 28 mmol/L (ref 22–32)
Calcium: 9.5 mg/dL (ref 8.9–10.3)
Chloride: 107 mmol/L (ref 98–111)
Creatinine: 0.98 mg/dL (ref 0.44–1.00)
GFR, Estimated: >60 mL/min (ref >60)
Glucose, Bld: 93 mg/dL (ref 70–99)
Potassium: 3.9 mmol/L (ref 3.5–5.1)
Sodium: 142 mmol/L (ref 135–145)
Total Bilirubin: 0.6 mg/dL (ref 0.3–1.2)
Total Protein: 7 g/dL (ref 6.5–8.1)

## 2023-03-20 LAB — CBC WITH DIFFERENTIAL (CANCER CENTER ONLY)
Abs Immature Granulocytes: 0.01 10*3/uL (ref 0.00–0.07)
Basophils Absolute: 0 10*3/uL (ref 0.0–0.1)
Basophils Relative: 1 %
Eosinophils Absolute: 0.1 10*3/uL (ref 0.0–0.5)
Eosinophils Relative: 3 %
HCT: 30.9 % — ABNORMAL LOW (ref 36.0–46.0)
Hemoglobin: 10.2 g/dL — ABNORMAL LOW (ref 12.0–15.0)
Immature Granulocytes: 0 %
Lymphocytes Relative: 23 %
Lymphs Abs: 0.8 10*3/uL (ref 0.7–4.0)
MCH: 34.8 pg — ABNORMAL HIGH (ref 26.0–34.0)
MCHC: 33 g/dL (ref 30.0–36.0)
MCV: 105.5 fL — ABNORMAL HIGH (ref 80.0–100.0)
Monocytes Absolute: 0.4 10*3/uL (ref 0.1–1.0)
Monocytes Relative: 11 %
Neutro Abs: 2.2 10*3/uL (ref 1.7–7.7)
Neutrophils Relative %: 62 %
Platelet Count: 142 10*3/uL — ABNORMAL LOW (ref 150–400)
RBC: 2.93 MIL/uL — ABNORMAL LOW (ref 3.87–5.11)
RDW: 15.9 % — ABNORMAL HIGH (ref 11.5–15.5)
WBC Count: 3.4 10*3/uL — ABNORMAL LOW (ref 4.0–10.5)
nRBC: 0 % (ref 0.0–0.2)

## 2023-03-20 LAB — TSH: TSH: 4.548 u[IU]/mL — ABNORMAL HIGH (ref 0.350–4.500)

## 2023-03-20 LAB — TOTAL PROTEIN, URINE DIPSTICK: Protein, ur: 30 mg/dL — AB

## 2023-03-20 MED ORDER — SODIUM CHLORIDE 0.9 % IV SOLN
15.0000 mg/kg | Freq: Once | INTRAVENOUS | Status: AC
  Administered 2023-03-20: 1400 mg via INTRAVENOUS
  Filled 2023-03-20: qty 8

## 2023-03-20 MED ORDER — SODIUM CHLORIDE 0.9% FLUSH
10.0000 mL | INTRAVENOUS | Status: DC | PRN
  Administered 2023-03-20: 10 mL

## 2023-03-20 MED ORDER — SODIUM CHLORIDE 0.9 % IV SOLN
200.0000 mg | Freq: Once | INTRAVENOUS | Status: AC
  Administered 2023-03-20: 200 mg via INTRAVENOUS
  Filled 2023-03-20: qty 200

## 2023-03-20 MED ORDER — HEPARIN SOD (PORK) LOCK FLUSH 100 UNIT/ML IV SOLN
500.0000 [IU] | Freq: Once | INTRAVENOUS | Status: AC | PRN
  Administered 2023-03-20: 500 [IU]

## 2023-03-20 MED ORDER — SODIUM CHLORIDE 0.9 % IV SOLN: Freq: Once | INTRAVENOUS | Status: AC

## 2023-03-20 MED ORDER — ATENOLOL 50 MG PO TABS: 50.0000 mg | ORAL_TABLET | Freq: Every day | ORAL | Status: DC

## 2023-03-20 MED ORDER — SODIUM CHLORIDE 0.9% FLUSH
10.0000 mL | Freq: Once | INTRAVENOUS | Status: AC
  Administered 2023-03-20: 10 mL

## 2023-03-20 NOTE — Patient Instructions (Signed)
[place] [facility]  Discharge Instructions: Thank you for choosing [facility] to provide your oncology and hematology care.   If you have a lab appointment with the Cancer Center, please go directly to the Cancer Center and check in at the registration area.   Wear comfortable clothing and clothing appropriate for easy access to any Portacath or PICC line.   We strive to give you quality time with your provider. You may need to reschedule your appointment if you arrive late (15 or more minutes).  Arriving late affects you and other patients whose appointments are after yours.  Also, if you miss three or more appointments without notifying the office, you may be dismissed from the clinic at the provider's discretion.      For prescription refill requests, have your pharmacy contact our office and allow 72 hours for refills to be completed.    Today you received the following chemotherapy and/or immunotherapy agents: Keytruda & MVASI      To help prevent nausea and vomiting after your treatment, we encourage you to take your nausea medication as directed.  BELOW ARE SYMPTOMS THAT SHOULD BE REPORTED IMMEDIATELY: *FEVER GREATER THAN 100.4 F (38 C) OR HIGHER *CHILLS OR SWEATING *NAUSEA AND VOMITING THAT IS NOT CONTROLLED WITH YOUR NAUSEA MEDICATION *UNUSUAL SHORTNESS OF BREATH *UNUSUAL BRUISING OR BLEEDING *URINARY PROBLEMS (pain or burning when urinating, or frequent urination) *BOWEL PROBLEMS (unusual diarrhea, constipation, pain near the anus) TENDERNESS IN MOUTH AND THROAT WITH OR WITHOUT PRESENCE OF ULCERS (sore throat, sores in mouth, or a toothache) UNUSUAL RASH, SWELLING OR PAIN  UNUSUAL VAGINAL DISCHARGE OR ITCHING   Items with * indicate a potential emergency and should be followed up as soon as possible or go to the Emergency Department if any problems should occur.  Please show the CHEMOTHERAPY ALERT CARD or IMMUNOTHERAPY ALERT CARD  at check-in to the Emergency Department and triage nurse.  Should you have questions after your visit or need to cancel or reschedule your appointment, please contact [place] [facility]  Dept: [phone]  and follow the prompts.  Office hours are 8:00 a.m. to 4:30 p.m. Monday - Friday. Please note that voicemails left after 4:00 p.m. may not be returned until the following business day.  We are closed weekends and major holidays. You have access to a nurse at all times for urgent questions. Please call the main number to the clinic Dept: [phone] and follow the prompts.   For any non-urgent questions, you may also contact your provider using MyChart. We now offer e-Visits for anyone 28 and older to request care online for non-urgent symptoms. For details visit [email or web address].   Also download the MyChart app! Go to the app store, search "MyChart", open the app, select [place], and log in with your MyChart username and password.

## 2023-03-20 NOTE — Assessment & Plan Note (Signed)
So far, she tolerated treatment with bevacizumab and pembrolizumab well I noted that she has borderline suboptimal blood pressure control She has borderline proteinuria We will manage her blood pressure more aggressively We will continue treatment as prescribed I plan to repeat imaging study again in September

## 2023-03-20 NOTE — Assessment & Plan Note (Signed)
She has borderline suboptimal control of blood pressure I recommend increasing atenolol to 50 mg I cautioned the patient regarding potential bradycardia She will call me if her heart rate drops to under 60 We discussed importance of risk factor modification while on treatment

## 2023-03-20 NOTE — Progress Notes (Signed)
Cancer Center OFFICE PROGRESS NOTE  Patient Care Team: Marletta Lor, NP as PCP - General (Nurse Practitioner)  ASSESSMENT & PLAN:  Cervical cancer [facility]) So far, she tolerated treatment with bevacizumab and pembrolizumab well I noted that she has borderline suboptimal blood pressure control She has borderline proteinuria We will manage her blood pressure more aggressively We will continue treatment as prescribed I plan to repeat imaging study again in September  Pancytopenia, acquired [facility]) She has persistent pancytopenia due to recent chemotherapy but overall is improving She is not symptomatic Continue observation  Essential hypertension She has borderline suboptimal control of blood pressure I recommend increasing atenolol to 50 mg I cautioned the patient regarding potential bradycardia She will call me if her heart rate drops to under 60 We discussed importance of risk factor modification while on treatment  No orders of the defined types were placed in this encounter.   All questions were answered. The patient knows to call the clinic with any problems, questions or concerns. The total time spent in the appointment was 30 minutes encounter with patients including review of chart and various tests results, discussions about plan of care and coordination of care plan   Artis Delay, MD 03/20/2023 9:47 AM  INTERVAL HISTORY: Please see below for problem oriented charting. she returns for treatment follow-up She tolerated recent treatment well She did not bring her blood pressure readings but majority of her systolic blood pressure ranges between 1 40-1 50 She is not symptomatic She is recovering well from recent chemotherapy  REVIEW OF SYSTEMS:   Constitutional: Denies fevers, chills or abnormal weight loss Eyes: Denies blurriness of vision Ears, nose, mouth, throat, and face: Denies mucositis or sore throat Respiratory: Denies cough, dyspnea or  wheezes Cardiovascular: Denies palpitation, chest discomfort or lower extremity swelling Gastrointestinal:  Denies nausea, heartburn or change in bowel habits Skin: Denies abnormal skin rashes Lymphatics: Denies new lymphadenopathy or easy bruising Neurological:Denies numbness, tingling or new weaknesses Behavioral/Psych: Mood is stable, no new changes  All other systems were reviewed with the patient and are negative.  I have reviewed the past medical history, past surgical history, social history and family history with the patient and they are unchanged from previous note.  ALLERGIES:  is allergic to lisinopril.  MEDICATIONS:  Current Outpatient Medications  Medication Sig Dispense Refill   amLODipine (NORVASC) 10 MG tablet Take 1 tablet (10 mg total) by mouth daily. 30 tablet 6   atenolol (TENORMIN) 50 MG tablet Take 1 tablet (50 mg total) by mouth daily. Take in the morning     diphenhydrAMINE (BENADRYL ALLERGY) 25 MG tablet Take 2 tablets (50 mg total) by mouth every 6 (six) hours as needed for allergies. 30 tablet 0   ezetimibe (ZETIA) 10 MG tablet Take 10 mg by mouth in the morning.     famotidine (PEPCID) 20 MG tablet Take 1 tablet (20 mg total) by mouth daily for 5 days. 5 tablet 0   glimepiride (AMARYL) 1 MG tablet Take 1 mg by mouth daily with breakfast.     lidocaine-prilocaine (EMLA) cream Apply 1 Application topically as needed (for port access). 30 g 3   magnesium oxide (MAG-OX) 400 (240 Mg) MG tablet Take 1 tablet (400 mg total) by mouth daily. 30 tablet 3   ondansetron (ZOFRAN) 8 MG tablet Take 1 tablet (8 mg total) by mouth every 8 (eight) hours as needed for nausea or vomiting. Start on the third day after chemotherapy. 30 tablet 1  prochlorperazine (COMPAZINE) 10 MG tablet Take 1 tablet (10 mg total) by mouth every 6 (six) hours as needed for nausea or vomiting. 30 tablet 1   rosuvastatin (CRESTOR) 40 MG tablet Take 40 mg by mouth at bedtime.     sitaGLIPtin (JANUVIA)  100 MG tablet Take 50 mg by mouth in the morning.     TYLENOL 500 MG tablet Take 500-1,000 mg by mouth every 6 (six) hours as needed for mild pain or headache.     vitamin B-12 (CYANOCOBALAMIN) 1000 MCG tablet Take 1,000 mcg by mouth in the morning.     No current facility-administered medications for this visit.    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   08/27/2020 Imaging   MRI pelvis Large cervical mass with pelvic adenopathy, at least T2B N1 disease. Question of involvement of posterior urinary bladder (T4) for which additional images have been requested. Patient will return for additional images to answer this question. (Thinner section axial T2 without fat sat and sagittal T2 weighted imaging also without fat saturation.)   Small field-of-view images show that the parametrial extension comes in very close proximity to the RIGHT ureter.   08/30/2020 Initial Diagnosis   Cervical cancer (HCC)   08/30/2020 Cancer Staging   Staging form: Cervix Uteri, AJCC Version 9 - Clinical stage from 08/30/2020: FIGO Stage IVB (rcT2, cN1, pM1) - Signed by Artis Delay, MD on 09/01/2022 Stage prefix: Recurrence   09/01/2020 PET scan   1. Large cervical mass extending into the endometrial canal to the level of the uterine fundus, associated with pelvic adenopathy to the level of the RIGHT common iliac chain. Please refer to MRI for further detail. 2. No signs of solid organ uptake or metastatic disease to the chest. 3. Subcutaneous nodule with marked increased metabolic activity, correlation with direct clinical inspection is suggested to exclude cutaneous neoplasm or subcutaneous nodule in this location. Complicated sebaceous cyst could  also potentially have this appearance. 4. Uptake in the anal canal could likely physiologic. Given the intense nature of the uptake would suggest correlation with direct clinical inspection/examination. 5. Uptake in axillary lymph nodes with activity less than mediastinal blood pool likely small reactive lymph nodes, attention on follow-up. Morphologic features also with benign appearance.   09/16/2020 Procedure   Successful placement of a power injectable Port-A-Cath via the right internal jugular vein. The catheter is ready for immediate use.     09/20/2020 - 10/25/2020 Chemotherapy   She received cisplatin chemo with concurrent radiation       09/22/2020 - 12/08/2020 Radiation Therapy   Radiation Treatment Dates: 09/22/2020 through 12/08/2020   Site: Pelvis Technique: IMRT Total Dose (Gy): 45/45 Dose per Fx (Gy): 1.8 Completed Fx: 25/25 Beam Energies: 6x   Site: pelvic boost Technique: IMRT Total Dose (Gy): 7.2/7.2 Dose per Fx (Gy): 1.8 Completed Fx: 4/4 Beam Energies: 6x   Site: Cervix boost Technique: HDR-brachytherapy Total Dose (Gy): 27.5/27.5 Dose per Fx (Gy): 5.5 Completed Fx: 5/5 Beam Energies: Ir-192     03/17/2021 PET scan   1. Mark positive response to therapy. 2. Resolution of metabolic activity within the uterine body. 3. Resolution of size and metabolic activity of pelvic lymph nodes. 4. No evidence of new disease outside of the pelvis.   08/10/2022  PET scan   1. New hypermetabolic bilateral pulmonary nodules and hypermetabolic cervical and hilar lymph nodes, consistent with metastatic disease. 2. Hypermetabolic focus in the anterior right third costochondral junction without discrete CT correlate is also compatible with metastatic disease. 3. Prior hysterectomy without suspicious hypermetabolic nodularity along the vaginal cuff. 4.  Aortic Atherosclerosis (ICD10-I70.0).   08/24/2022 Pathology Results   A. SOFT TISSUE NODULE, RIGHT CHEST WALL, NEEDLE CORE  BIOPSY:  Poorly differentiated carcinoma.  See comment.   COMMENT:  The carcinoma is characterized by nest of malignant epithelioid cells which focally has features suggestive of squamous differentiation morphologically.  Immunohistochemistry is positive with cytokeratin 7, PAX8, MOC-31 and estrogen receptor.  There is patchy positivity with p16, progesterone receptor and TTF-1.  The tumor is negative with p40, cytokeratin 5/6, p63, CDX2, cytokeratin 20 and WT-1.  The immunophenotype is consistent with adenocarcinoma and suggestive of a gynecologic primary.    09/08/2022 -  Chemotherapy   Patient is on Treatment Plan : OVARIAN Carboplatin + Paclitaxel + Bevacizumab q21d      11/07/2022 Imaging   1. Decreased size of the pulmonary nodules and mediastinal adenopathy, consistent with treatment response. 2. Similar appearance of the previously hypermetabolic soft tissue subjacent to the right anterior third costochondral cartilage. 3. No convincing evidence of new or progressive disease in the chest, abdomen or pelvis. 4. Rectal wall thickening with stranding in the mesorectal fat and diffuse thickening of the urinary bladder wall, nonspecific but possibly reflecting post radiation change. 5. Nonobstructive right renal stones measure up to 6 mm in the renal pelvis. 6. Colonic diverticulosis without findings of acute diverticulitis. 7.  Aortic Atherosclerosis (ICD10-I70.0).   02/26/2023 Imaging   CT CHEST ABDOMEN PELVIS W CONTRAST  Result Date: 02/24/2023 CLINICAL DATA:  Cervical cancer, assess treatment response * Tracking Code: BO * EXAM: CT CHEST, ABDOMEN, AND PELVIS WITH CONTRAST TECHNIQUE: Multidetector CT imaging of the chest, abdomen and pelvis was performed following the standard protocol during bolus administration of intravenous contrast. RADIATION DOSE REDUCTION: This exam was performed according to the departmental dose-optimization program which includes automated exposure control,  adjustment of the mA and/or kV according to patient size and/or use of iterative reconstruction technique. CONTRAST:  OMNIPAQUE IOHEXOL 300 MG/ML  SOLN COMPARISON:  CT chest abdomen pelvis, 11/07/2022, PET-CT, 08/10/2022 FINDINGS: CT CHEST FINDINGS Cardiovascular: Right chest port catheter. Normal heart size. No pericardial effusion. Mediastinum/Nodes: No persistently enlarged mediastinal, hilar, or axillary lymph nodes. Benign calcified left hilar lymph nodes. Small hiatal hernia. Thyroid gland, trachea, and esophagus demonstrate no significant findings. Lungs/Pleura: Near complete resolution of previously seen bilateral pulmonary nodules, only tiny treated residual remaining, for example a 0.5 cm irregular nodule of the peripheral right apex (series 4, image 28) and a linear residua in the medial left upper lobe (series 4, image 42) benign calcified nodule of the anterior left upper lobe, for which no further follow-up or characterization is required (series 4, image 46). Scarring and volume loss of the left lung base (series 4, image 105). No pleural effusion or pneumothorax. Musculoskeletal: No chest wall abnormality. Previously FDG avid soft tissue nodule in the anterior right third chondral cartilage is not well appreciated by today's CT (series 2, image 24). No acute osseous findings. CT ABDOMEN PELVIS FINDINGS Hepatobiliary: No solid liver abnormality is seen. No gallstones, gallbladder wall thickening, or biliary dilatation. Pancreas: Unremarkable. No pancreatic ductal dilatation or surrounding inflammatory changes. Spleen: Normal in size. Punctuate granulomatous calcifications of the spleen. Adrenals/Urinary Tract: Adrenal  glands are unremarkable. Nonobstructive calculi of the superior pole of the right kidney as well as the right renal pelvis, renal pelvic calculus measuring 0.8 cm (series 2, image 66). The left kidney is normal, without renal calculi, solid lesion, or hydronephrosis. Unchanged  diffuse bladder wall thickening with adjacent fat stranding (series 6, image 106) Stomach/Bowel: Stomach is within normal limits. Appendix is not clearly visualized and may be surgically absent. No evidence of bowel wall thickening, distention, or inflammatory changes. Moderate burden of stool throughout the colon and rectum. Sigmoid diverticulosis. Unchanged long segment wall thickening involving the distal sigmoid colon and rectum, particularly with circumferential wall thickening and perirectal fat spiculation of the rectum (series 2, image 101) Vascular/Lymphatic: Aortic atherosclerosis. No enlarged abdominal or pelvic lymph nodes. Reproductive: No directly visualized cervical mass. Other: No abdominal wall hernia or abnormality. No ascites. Musculoskeletal: No acute osseous findings. Unchanged sclerotic inferior endplate deformity of L5 (series 6, image 100). IMPRESSION: 1. No directly visualized cervical mass. 2. Near complete resolution of previously seen pulmonary nodules, with only small residua, consistent with continued treatment response of pulmonary metastatic disease. 3. No persistently enlarged mediastinal lymph nodes, consistent with sustained treatment response of nodal metastatic disease. 4. No appreciable CT correlate of a previously seen FDG avid soft tissue nodule within the anterior right third costochondral cartilage. 5. Unchanged long segment wall thickening involving the distal sigmoid colon and rectum, particularly with circumferential wall thickening and perirectal fat spiculation of the rectum, as well as diffuse bladder wall thickening. 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 Attention on follow-up. 6. Nonobstructive right nephrolithiasis. Aortic Atherosclerosis (ICD10-I70.0). Electronically Signed   By: Jearld Lesch M.D.   On: 02/24/2023 16:31      Malignant neoplasm of cervix (HCC)  09/01/2020 Initial Diagnosis   Malignant neoplasm of cervix  (HCC)   09/20/2020 - 10/25/2020 Chemotherapy   She received cisplatin chemo with concurrent radiation       09/22/2020 - 12/08/2020 Radiation Therapy   Radiation Treatment Dates: 09/22/2020 through 12/08/2020   Site: Pelvis Technique: IMRT Total Dose (Gy): 45/45 Dose per Fx (Gy): 1.8 Completed Fx: 25/25 Beam Energies: 6x   Site: pelvic boost Technique: IMRT Total Dose (Gy): 7.2/7.2 Dose per Fx (Gy): 1.8 Completed Fx: 4/4 Beam Energies: 6x   Site: Cervix boost Technique: HDR-brachytherapy Total Dose (Gy): 27.5/27.5 Dose per Fx (Gy): 5.5 Completed Fx: 5/5 Beam Energies: Ir-192   09/08/2022 -  Chemotherapy   Patient is on Treatment Plan : OVARIAN Carboplatin + Paclitaxel + Bevacizumab q21d        PHYSICAL EXAMINATION: ECOG PERFORMANCE STATUS: 0 - Asymptomatic  Vitals:   03/20/23 0928  BP: (!) 165/70  Pulse: 86  Resp: 18  SpO2: 100%   Filed Weights   03/20/23 0928  Weight: 201 lb 6.4 oz (91.4 kg)    GENERAL:alert, no distress and comfortable NEURO: alert & oriented x 3 with fluent speech, no focal motor/sensory deficits  LABORATORY DATA:  I have reviewed the data as listed    Component Value Date/Time   NA 139 02/23/2023 1534   K 3.7 02/23/2023 1534   CL 105 02/23/2023 1534   CO2 27 02/23/2023 1534   GLUCOSE 93 02/23/2023 1534   BUN 14 02/23/2023 1534   CREATININE 0.80 02/23/2023 1534   CALCIUM 9.5 02/23/2023 1534   PROT 6.6 02/23/2023 1534   ALBUMIN 3.9 02/23/2023 1534   AST 16 02/23/2023 1534   ALT 9 02/23/2023 1534   ALKPHOS 45 02/23/2023  1534   BILITOT 0.7 02/23/2023 1534   GFRNONAA >60 02/23/2023 1534    No results found for: "SPEP", "UPEP"  Lab Results  Component Value Date   WBC 3.4 (L) 03/20/2023   NEUTROABS 2.2 03/20/2023   HGB 10.2 (L) 03/20/2023   HCT 30.9 (L) 03/20/2023   MCV 105.5 (H) 03/20/2023   PLT 142 (L) 03/20/2023      Chemistry      Component Value Date/Time   NA 139 02/23/2023 1534   K 3.7 02/23/2023 1534   CL 105  02/23/2023 1534   CO2 27 02/23/2023 1534   BUN 14 02/23/2023 1534   CREATININE 0.80 02/23/2023 1534      Component Value Date/Time   CALCIUM 9.5 02/23/2023 1534   ALKPHOS 45 02/23/2023 1534   AST 16 02/23/2023 1534   ALT 9 02/23/2023 1534   BILITOT 0.7 02/23/2023 1534

## 2023-03-20 NOTE — Assessment & Plan Note (Signed)
She has persistent pancytopenia due to recent chemotherapy but overall is improving She is not symptomatic Continue observation

## 2023-03-21 LAB — T4: T4, Total: 9.4 ug/dL (ref 4.5–12.0)

## 2023-04-06 ENCOUNTER — Telehealth: Payer: Self-pay | Admitting: Oncology

## 2023-04-06 NOTE — Telephone Encounter (Signed)
Kaedynce called and said her PCP, Marletta Lor, NP, would like her to have an Hgb A1C drawn with her labs on Tuesday.  Somer has a prescription she can bring in.  Advised her that she can drop off the prescription or bring it with her Tuesday morning.

## 2023-04-10 ENCOUNTER — Inpatient Hospital Stay: Payer: Medicare PPO

## 2023-04-10 ENCOUNTER — Other Ambulatory Visit: Payer: Self-pay

## 2023-04-10 ENCOUNTER — Encounter: Payer: Self-pay | Admitting: Hematology and Oncology

## 2023-04-10 ENCOUNTER — Inpatient Hospital Stay: Payer: Medicare PPO | Admitting: Hematology and Oncology

## 2023-04-10 VITALS — BP 176/54 | HR 69 | Resp 18 | Ht 62.0 in | Wt 204.4 lb

## 2023-04-10 VITALS — BP 134/52 | HR 54 | Temp 98.7°F

## 2023-04-10 DIAGNOSIS — C539 Malignant neoplasm of cervix uteri, unspecified: Secondary | ICD-10-CM

## 2023-04-10 DIAGNOSIS — D61818 Other pancytopenia: Secondary | ICD-10-CM | POA: Diagnosis not present

## 2023-04-10 DIAGNOSIS — Z5112 Encounter for antineoplastic immunotherapy: Secondary | ICD-10-CM | POA: Diagnosis not present

## 2023-04-10 DIAGNOSIS — C538 Malignant neoplasm of overlapping sites of cervix uteri: Secondary | ICD-10-CM | POA: Diagnosis not present

## 2023-04-10 LAB — CMP (CANCER CENTER ONLY)
ALT: 10 U/L (ref 0–44)
AST: 18 U/L (ref 15–41)
Albumin: 4 g/dL (ref 3.5–5.0)
Alkaline Phosphatase: 43 U/L (ref 38–126)
Anion gap: 7 (ref 5–15)
BUN: 23 mg/dL (ref 8–23)
CO2: 27 mmol/L (ref 22–32)
Calcium: 9.8 mg/dL (ref 8.9–10.3)
Chloride: 107 mmol/L (ref 98–111)
Creatinine: 1.08 mg/dL — ABNORMAL HIGH (ref 0.44–1.00)
GFR, Estimated: 55 mL/min — ABNORMAL LOW (ref >60)
Glucose, Bld: 91 mg/dL (ref 70–99)
Potassium: 4.1 mmol/L (ref 3.5–5.1)
Sodium: 141 mmol/L (ref 135–145)
Total Bilirubin: 0.6 mg/dL (ref 0.3–1.2)
Total Protein: 6.8 g/dL (ref 6.5–8.1)

## 2023-04-10 LAB — CBC WITH DIFFERENTIAL (CANCER CENTER ONLY)
Abs Immature Granulocytes: 0.02 10*3/uL (ref 0.00–0.07)
Basophils Absolute: 0 10*3/uL (ref 0.0–0.1)
Basophils Relative: 0 %
Eosinophils Absolute: 0.1 10*3/uL (ref 0.0–0.5)
Eosinophils Relative: 3 %
HCT: 31.1 % — ABNORMAL LOW (ref 36.0–46.0)
Hemoglobin: 10.8 g/dL — ABNORMAL LOW (ref 12.0–15.0)
Immature Granulocytes: 0 %
Lymphocytes Relative: 17 %
Lymphs Abs: 0.8 10*3/uL (ref 0.7–4.0)
MCH: 35.3 pg — ABNORMAL HIGH (ref 26.0–34.0)
MCHC: 34.7 g/dL (ref 30.0–36.0)
MCV: 101.6 fL — ABNORMAL HIGH (ref 80.0–100.0)
Monocytes Absolute: 0.4 10*3/uL (ref 0.1–1.0)
Monocytes Relative: 9 %
Neutro Abs: 3.2 10*3/uL (ref 1.7–7.7)
Neutrophils Relative %: 71 %
Platelet Count: 144 10*3/uL — ABNORMAL LOW (ref 150–400)
RBC: 3.06 MIL/uL — ABNORMAL LOW (ref 3.87–5.11)
RDW: 14.5 % (ref 11.5–15.5)
WBC Count: 4.6 10*3/uL (ref 4.0–10.5)
nRBC: 0 % (ref 0.0–0.2)

## 2023-04-10 LAB — TSH: TSH: 3.721 u[IU]/mL (ref 0.350–4.500)

## 2023-04-10 MED ORDER — HEPARIN SOD (PORK) LOCK FLUSH 100 UNIT/ML IV SOLN
500.0000 [IU] | Freq: Once | INTRAVENOUS | Status: AC | PRN
  Administered 2023-04-10: 500 [IU]

## 2023-04-10 MED ORDER — SODIUM CHLORIDE 0.9% FLUSH
10.0000 mL | Freq: Once | INTRAVENOUS | Status: AC
  Administered 2023-04-10: 10 mL

## 2023-04-10 MED ORDER — SODIUM CHLORIDE 0.9 % IV SOLN
15.0000 mg/kg | Freq: Once | INTRAVENOUS | Status: AC
  Administered 2023-04-10: 1400 mg via INTRAVENOUS
  Filled 2023-04-10: qty 8

## 2023-04-10 MED ORDER — SODIUM CHLORIDE 0.9% FLUSH
10.0000 mL | INTRAVENOUS | Status: DC | PRN
  Administered 2023-04-10: 10 mL

## 2023-04-10 MED ORDER — SODIUM CHLORIDE 0.9 % IV SOLN
200.0000 mg | Freq: Once | INTRAVENOUS | Status: AC
  Administered 2023-04-10: 200 mg via INTRAVENOUS
  Filled 2023-04-10: qty 200

## 2023-04-10 MED ORDER — SODIUM CHLORIDE 0.9 % IV SOLN: Freq: Once | INTRAVENOUS | Status: AC

## 2023-04-10 NOTE — Assessment & Plan Note (Signed)
So far, she tolerated treatment with bevacizumab and pembrolizumab well I noted that she has borderline suboptimal blood pressure We will continue treatment as prescribed I plan to repeat imaging study again in September

## 2023-04-10 NOTE — Assessment & Plan Note (Signed)
She has persistent pancytopenia due to recent chemotherapy but overall is improving She is not symptomatic Continue observation

## 2023-04-10 NOTE — Telephone Encounter (Signed)
Yes please

## 2023-04-10 NOTE — Patient Instructions (Addendum)
[facility]  Discharge Instructions: Thank you for choosing [facility] to provide your oncology and hematology care.   If you have a lab appointment with the Cancer Center, please go directly to the Cancer Center and check in at the registration area.   Wear comfortable clothing and clothing appropriate for easy access to any Portacath or PICC line.   We strive to give you quality time with your provider. You may need to reschedule your appointment if you arrive late (15 or more minutes).  Arriving late affects you and other patients whose appointments are after yours.  Also, if you miss three or more appointments without notifying the office, you may be dismissed from the clinic at the provider's discretion.      For prescription refill requests, have your pharmacy contact our office and allow 72 hours for refills to be completed.    Today you received the following chemotherapy and/or immunotherapy agents: Keytruda, Mvasi      To help prevent nausea and vomiting after your treatment, we encourage you to take your nausea medication as directed.  BELOW ARE SYMPTOMS THAT SHOULD BE REPORTED IMMEDIATELY: *FEVER GREATER THAN 100.4 F (38 C) OR HIGHER *CHILLS OR SWEATING *NAUSEA AND VOMITING THAT IS NOT CONTROLLED WITH YOUR NAUSEA MEDICATION *UNUSUAL SHORTNESS OF BREATH *UNUSUAL BRUISING OR BLEEDING *URINARY PROBLEMS (pain or burning when urinating, or frequent urination) *BOWEL PROBLEMS (unusual diarrhea, constipation, pain near the anus) TENDERNESS IN MOUTH AND THROAT WITH OR WITHOUT PRESENCE OF ULCERS (sore throat, sores in mouth, or a toothache) UNUSUAL RASH, SWELLING OR PAIN  UNUSUAL VAGINAL DISCHARGE OR ITCHING   Items with * indicate a potential emergency and should be followed up as soon as possible or go to the Emergency Department if any problems should occur.  Please show the CHEMOTHERAPY ALERT CARD or IMMUNOTHERAPY ALERT CARD at  check-in to the Emergency Department and triage nurse.  Should you have questions after your visit or need to cancel or reschedule your appointment, please contact [facility]  Dept: [phone]  and follow the prompts.  Office hours are 8:00 a.m. to 4:30 p.m. Monday - Friday. Please note that voicemails left after 4:00 p.m. may not be returned until the following business day.  We are closed weekends and major holidays. You have access to a nurse at all times for urgent questions. Please call the main number to the clinic Dept: [phone] and follow the prompts.   For any non-urgent questions, you may also contact your provider using MyChart. We now offer e-Visits for anyone 29 and older to request care online for non-urgent symptoms. For details visit [email or web address].   Also download the MyChart app! Go to the app store, search "MyChart", open the app, select [place], and log in with your MyChart username and password.

## 2023-04-10 NOTE — Progress Notes (Signed)
[facility] OFFICE PROGRESS NOTE  Patient Care Team: Marletta Lor, NP as PCP - General (Nurse Practitioner)  ASSESSMENT & PLAN:  Cervical cancer [facility]) So far, she tolerated treatment with bevacizumab and pembrolizumab well I noted that she has borderline suboptimal blood pressure We will continue treatment as prescribed I plan to repeat imaging study again in September  Pancytopenia, acquired [facility]) She has persistent pancytopenia due to recent chemotherapy but overall is improving She is not symptomatic Continue observation  Orders Placed This Encounter  Procedures   CT CHEST ABDOMEN PELVIS W CONTRAST    Standing Status:   Future    Standing Expiration Date:   04/09/2024    Scheduling Instructions:     No need oral contrast    Order Specific Question:   If indicated for the ordered procedure, I authorize the administration of contrast media per Radiology protocol    Answer:   Yes    Order Specific Question:   Does the patient have a contrast media/X-ray dye allergy?    Answer:   No    Order Specific Question:   Preferred imaging location?    Answer:   [facility]    Order Specific Question:   If indicated for the ordered procedure, I authorize the administration of oral contrast media per Radiology protocol    Answer:   Yes   Total Protein, Urine dipstick    Standing Status:   Future    Standing Expiration Date:   04/30/2024   TSH    Standing Status:   Future    Standing Expiration Date:   04/30/2024   T4    Standing Status:   Future    Standing Expiration Date:   04/30/2024   CBC with Differential (Cancer Center Only)    Standing Status:   Future    Standing Expiration Date:   04/30/2024   CMP (Cancer Center only)    Standing Status:   Future    Standing Expiration Date:   04/30/2024   Total Protein, Urine dipstick    Standing Status:   Future    Standing Expiration Date:   05/21/2024   TSH    Standing Status:   Future    Standing Expiration Date:    05/21/2024   T4    Standing Status:   Future    Standing Expiration Date:   05/21/2024   CBC with Differential (Cancer Center Only)    Standing Status:   Future    Standing Expiration Date:   05/21/2024   CMP (Cancer Center only)    Standing Status:   Future    Standing Expiration Date:   05/21/2024    All questions were answered. The patient knows to call the clinic with any problems, questions or concerns. The total time spent in the appointment was 25 minutes encounter with patients including review of chart and various tests results, discussions about plan of care and coordination of care plan   Artis Delay, MD 04/10/2023 11:38 AM  INTERVAL HISTORY: Please see below for problem oriented charting. she returns for treatment follow-up She is doing well Her blood pressure is usually better controlled at home Systolic is usually in the 140s over diastolic in the 60s She is completely asymptomatic apart from mild intermittent lower back pain that comes and goes  REVIEW OF SYSTEMS:   Constitutional: Denies fevers, chills or abnormal weight loss Eyes: Denies blurriness of vision Ears, nose, mouth, throat, and face: Denies mucositis or  sore throat Respiratory: Denies cough, dyspnea or wheezes Cardiovascular: Denies palpitation, chest discomfort or lower extremity swelling Gastrointestinal:  Denies nausea, heartburn or change in bowel habits Skin: Denies abnormal skin rashes Lymphatics: Denies new lymphadenopathy or easy bruising Neurological:Denies numbness, tingling or new weaknesses Behavioral/Psych: Mood is stable, no new changes  All other systems were reviewed with the patient and are negative.  I have reviewed the past medical history, past surgical history, social history and family history with the patient and they are unchanged from previous note.  ALLERGIES:  is allergic to lisinopril.  MEDICATIONS:  Current Outpatient Medications  Medication Sig Dispense Refill    amLODipine (NORVASC) 10 MG tablet Take 1 tablet (10 mg total) by mouth daily. 30 tablet 6   atenolol (TENORMIN) 50 MG tablet Take 1 tablet (50 mg total) by mouth daily. Take in the morning     diphenhydrAMINE (BENADRYL ALLERGY) 25 MG tablet Take 2 tablets (50 mg total) by mouth every 6 (six) hours as needed for allergies. 30 tablet 0   ezetimibe (ZETIA) 10 MG tablet Take 10 mg by mouth in the morning.     famotidine (PEPCID) 20 MG tablet Take 1 tablet (20 mg total) by mouth daily for 5 days. 5 tablet 0   glimepiride (AMARYL) 1 MG tablet Take 1 mg by mouth daily with breakfast.     lidocaine-prilocaine (EMLA) cream Apply 1 Application topically as needed (for port access). 30 g 3   magnesium oxide (MAG-OX) 400 (240 Mg) MG tablet Take 1 tablet (400 mg total) by mouth daily. 30 tablet 3   ondansetron (ZOFRAN) 8 MG tablet Take 1 tablet (8 mg total) by mouth every 8 (eight) hours as needed for nausea or vomiting. Start on the third day after chemotherapy. 30 tablet 1   prochlorperazine (COMPAZINE) 10 MG tablet Take 1 tablet (10 mg total) by mouth every 6 (six) hours as needed for nausea or vomiting. 30 tablet 1   rosuvastatin (CRESTOR) 40 MG tablet Take 40 mg by mouth at bedtime.     sitaGLIPtin (JANUVIA) 100 MG tablet Take 50 mg by mouth in the morning.     TYLENOL 500 MG tablet Take 500-1,000 mg by mouth every 6 (six) hours as needed for mild pain or headache.     vitamin B-12 (CYANOCOBALAMIN) 1000 MCG tablet Take 1,000 mcg by mouth in the morning.     No current facility-administered medications for this visit.   Facility-Administered Medications Ordered in Other Visits  Medication Dose Route Frequency Provider Last Rate Last Admin   bevacizumab-awwb (MVASI) 1,400 mg in sodium chloride 0.9 % 100 mL chemo infusion  15 mg/kg (Treatment Plan Recorded) Intravenous Once Bertis Ruddy, Synethia Endicott, MD       heparin lock flush 100 unit/mL  500 Units Intracatheter Once PRN Bertis Ruddy, Dakisha Schoof, MD       pembrolizumab (KEYTRUDA)  200 mg in sodium chloride 0.9 % 50 mL chemo infusion  200 mg Intravenous Once Dylin Breeden, MD       sodium chloride flush (NS) 0.9 % injection 10 mL  10 mL Intracatheter PRN Artis Delay, MD        SUMMARY OF ONCOLOGIC HISTORY: Oncology History Overview Note  Adenocarcinoma, 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   08/27/2020 Imaging   MRI pelvis Large cervical mass with pelvic adenopathy,  at least T2B N1 disease. Question of involvement of posterior urinary bladder (T4) for which additional images have been requested. Patient will return for additional images to answer this question. (Thinner section axial T2 without fat sat and sagittal T2 weighted imaging also without fat saturation.)   Small field-of-view images show that the parametrial extension comes in very close proximity to the RIGHT ureter.   08/30/2020 Initial Diagnosis   Cervical cancer (HCC)   08/30/2020 Cancer Staging   Staging form: Cervix Uteri, AJCC Version 9 - Clinical stage from 08/30/2020: FIGO Stage IVB (rcT2, cN1, pM1) - Signed by Artis Delay, MD on 09/01/2022 Stage prefix: Recurrence   09/01/2020 PET scan   1. Large cervical mass extending into the endometrial canal to the level of the uterine fundus, associated with pelvic adenopathy to the level of the RIGHT common iliac chain. Please refer to MRI for further detail. 2. No signs of solid organ uptake or metastatic disease to the chest. 3. Subcutaneous nodule with marked increased metabolic activity, correlation with direct clinical inspection is suggested to exclude cutaneous neoplasm or subcutaneous nodule in this location. Complicated sebaceous cyst could also potentially have this appearance. 4. Uptake in the anal canal could likely physiologic. Given the intense nature of  the uptake would suggest correlation with direct clinical inspection/examination. 5. Uptake in axillary lymph nodes with activity less than mediastinal blood pool likely small reactive lymph nodes, attention on follow-up. Morphologic features also with benign appearance.   09/16/2020 Procedure   Successful placement of a power injectable Port-A-Cath via the right internal jugular vein. The catheter is ready for immediate use.     09/20/2020 - 10/25/2020 Chemotherapy   She received cisplatin chemo with concurrent radiation       09/22/2020 - 12/08/2020 Radiation Therapy   Radiation Treatment Dates: 09/22/2020 through 12/08/2020   Site: Pelvis Technique: IMRT Total Dose (Gy): 45/45 Dose per Fx (Gy): 1.8 Completed Fx: 25/25 Beam Energies: 6x   Site: pelvic boost Technique: IMRT Total Dose (Gy): 7.2/7.2 Dose per Fx (Gy): 1.8 Completed Fx: 4/4 Beam Energies: 6x   Site: Cervix boost Technique: HDR-brachytherapy Total Dose (Gy): 27.5/27.5 Dose per Fx (Gy): 5.5 Completed Fx: 5/5 Beam Energies: Ir-192     03/17/2021 PET scan   1. Mark positive response to therapy. 2. Resolution of metabolic activity within the uterine body. 3. Resolution of size and metabolic activity of pelvic lymph nodes. 4. No evidence of new disease outside of the pelvis.   08/10/2022 PET scan   1. New hypermetabolic bilateral pulmonary nodules and hypermetabolic cervical and hilar lymph nodes, consistent with metastatic disease. 2. Hypermetabolic focus in the anterior right third costochondral junction without discrete CT correlate is also compatible with metastatic disease. 3. Prior hysterectomy without suspicious hypermetabolic nodularity along the vaginal cuff. 4.  Aortic Atherosclerosis (ICD10-I70.0).   08/24/2022 Pathology Results   A. SOFT TISSUE NODULE, RIGHT CHEST WALL, NEEDLE CORE BIOPSY:  Poorly differentiated carcinoma.  See comment.   COMMENT:  The carcinoma is characterized by nest of malignant  epithelioid cells which focally has features suggestive of squamous differentiation morphologically.  Immunohistochemistry is positive with cytokeratin 7, PAX8, MOC-31 and estrogen receptor.  There is patchy positivity with p16, progesterone receptor and TTF-1.  The tumor is negative with p40, cytokeratin 5/6, p63, CDX2, cytokeratin 20 and WT-1.  The immunophenotype is consistent with adenocarcinoma and suggestive of a gynecologic primary.    09/08/2022 -  Chemotherapy   Patient is on Treatment Plan : OVARIAN Carboplatin +  Paclitaxel + Bevacizumab q21d      11/07/2022 Imaging   1. Decreased size of the pulmonary nodules and mediastinal adenopathy, consistent with treatment response. 2. Similar appearance of the previously hypermetabolic soft tissue subjacent to the right anterior third costochondral cartilage. 3. No convincing evidence of new or progressive disease in the chest, abdomen or pelvis. 4. Rectal wall thickening with stranding in the mesorectal fat and diffuse thickening of the urinary bladder wall, nonspecific but possibly reflecting post radiation change. 5. Nonobstructive right renal stones measure up to 6 mm in the renal pelvis. 6. Colonic diverticulosis without findings of acute diverticulitis. 7.  Aortic Atherosclerosis (ICD10-I70.0).   02/26/2023 Imaging   CT CHEST ABDOMEN PELVIS W CONTRAST  Result Date: 02/24/2023 CLINICAL DATA:  Cervical cancer, assess treatment response * Tracking Code: BO * EXAM: CT CHEST, ABDOMEN, AND PELVIS WITH CONTRAST TECHNIQUE: Multidetector CT imaging of the chest, abdomen and pelvis was performed following the standard protocol during bolus administration of intravenous contrast. RADIATION DOSE REDUCTION: This exam was performed according to the departmental dose-optimization program which includes automated exposure control, adjustment of the mA and/or kV according to patient size and/or use of iterative reconstruction technique. CONTRAST:   OMNIPAQUE IOHEXOL 300 MG/ML  SOLN COMPARISON:  CT chest abdomen pelvis, 11/07/2022, PET-CT, 08/10/2022 FINDINGS: CT CHEST FINDINGS Cardiovascular: Right chest port catheter. Normal heart size. No pericardial effusion. Mediastinum/Nodes: No persistently enlarged mediastinal, hilar, or axillary lymph nodes. Benign calcified left hilar lymph nodes. Small hiatal hernia. Thyroid gland, trachea, and esophagus demonstrate no significant findings. Lungs/Pleura: Near complete resolution of previously seen bilateral pulmonary nodules, only tiny treated residual remaining, for example a 0.5 cm irregular nodule of the peripheral right apex (series 4, image 28) and a linear residua in the medial left upper lobe (series 4, image 42) benign calcified nodule of the anterior left upper lobe, for which no further follow-up or characterization is required (series 4, image 46). Scarring and volume loss of the left lung base (series 4, image 105). No pleural effusion or pneumothorax. Musculoskeletal: No chest wall abnormality. Previously FDG avid soft tissue nodule in the anterior right third chondral cartilage is not well appreciated by today's CT (series 2, image 24). No acute osseous findings. CT ABDOMEN PELVIS FINDINGS Hepatobiliary: No solid liver abnormality is seen. No gallstones, gallbladder wall thickening, or biliary dilatation. Pancreas: Unremarkable. No pancreatic ductal dilatation or surrounding inflammatory changes. Spleen: Normal in size. Punctuate granulomatous calcifications of the spleen. Adrenals/Urinary Tract: Adrenal glands are unremarkable. Nonobstructive calculi of the superior pole of the right kidney as well as the right renal pelvis, renal pelvic calculus measuring 0.8 cm (series 2, image 66). The left kidney is normal, without renal calculi, solid lesion, or hydronephrosis. Unchanged diffuse bladder wall thickening with adjacent fat stranding (series 6, image 106) Stomach/Bowel: Stomach is within normal  limits. Appendix is not clearly visualized and may be surgically absent. No evidence of bowel wall thickening, distention, or inflammatory changes. Moderate burden of stool throughout the colon and rectum. Sigmoid diverticulosis. Unchanged long segment wall thickening involving the distal sigmoid colon and rectum, particularly with circumferential wall thickening and perirectal fat spiculation of the rectum (series 2, image 101) Vascular/Lymphatic: Aortic atherosclerosis. No enlarged abdominal or pelvic lymph nodes. Reproductive: No directly visualized cervical mass. Other: No abdominal wall hernia or abnormality. No ascites. Musculoskeletal: No acute osseous findings. Unchanged sclerotic inferior endplate deformity of L5 (series 6, image 100). IMPRESSION: 1. No directly visualized cervical mass. 2. Near complete resolution of  previously seen pulmonary nodules, with only small residua, consistent with continued treatment response of pulmonary metastatic disease. 3. No persistently enlarged mediastinal lymph nodes, consistent with sustained treatment response of nodal metastatic disease. 4. No appreciable CT correlate of a previously seen FDG avid soft tissue nodule within the anterior right third costochondral cartilage. 5. Unchanged long segment wall thickening involving the distal sigmoid colon and rectum, particularly with circumferential wall thickening and perirectal fat spiculation of the rectum, as well as diffuse bladder wall thickening. 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 Attention on follow-up. 6. Nonobstructive right nephrolithiasis. Aortic Atherosclerosis (ICD10-I70.0). Electronically Signed   By: Jearld Lesch M.D.   On: 02/24/2023 16:31      Malignant neoplasm of cervix (HCC)  09/01/2020 Initial Diagnosis   Malignant neoplasm of cervix (HCC)   09/20/2020 - 10/25/2020 Chemotherapy   She received cisplatin chemo with concurrent radiation        09/22/2020 - 12/08/2020 Radiation Therapy   Radiation Treatment Dates: 09/22/2020 through 12/08/2020   Site: Pelvis Technique: IMRT Total Dose (Gy): 45/45 Dose per Fx (Gy): 1.8 Completed Fx: 25/25 Beam Energies: 6x   Site: pelvic boost Technique: IMRT Total Dose (Gy): 7.2/7.2 Dose per Fx (Gy): 1.8 Completed Fx: 4/4 Beam Energies: 6x   Site: Cervix boost Technique: HDR-brachytherapy Total Dose (Gy): 27.5/27.5 Dose per Fx (Gy): 5.5 Completed Fx: 5/5 Beam Energies: Ir-192   09/08/2022 -  Chemotherapy   Patient is on Treatment Plan : OVARIAN Carboplatin + Paclitaxel + Bevacizumab q21d        PHYSICAL EXAMINATION: ECOG PERFORMANCE STATUS: 0 - Asymptomatic  Vitals:   04/10/23 1102  BP: (!) 176/54  Pulse: 69  Resp: 18  SpO2: 100%   Filed Weights   04/10/23 1102  Weight: 204 lb 6.4 oz (92.7 kg)    GENERAL:alert, no distress and comfortable  NEURO: alert & oriented x 3 with fluent speech, no focal motor/sensory deficits  LABORATORY DATA:  I have reviewed the data as listed    Component Value Date/Time   NA 141 04/10/2023 1026   K 4.1 04/10/2023 1026   CL 107 04/10/2023 1026   CO2 27 04/10/2023 1026   GLUCOSE 91 04/10/2023 1026   BUN 23 04/10/2023 1026   CREATININE 1.08 (H) 04/10/2023 1026   CALCIUM 9.8 04/10/2023 1026   PROT 6.8 04/10/2023 1026   ALBUMIN 4.0 04/10/2023 1026   AST 18 04/10/2023 1026   ALT 10 04/10/2023 1026   ALKPHOS 43 04/10/2023 1026   BILITOT 0.6 04/10/2023 1026   GFRNONAA 55 (L) 04/10/2023 1026    No results found for: "SPEP", "UPEP"  Lab Results  Component Value Date   WBC 4.6 04/10/2023   NEUTROABS 3.2 04/10/2023   HGB 10.8 (L) 04/10/2023   HCT 31.1 (L) 04/10/2023   MCV 101.6 (H) 04/10/2023   PLT 144 (L) 04/10/2023      Chemistry      Component Value Date/Time   NA 141 04/10/2023 1026   K 4.1 04/10/2023 1026   CL 107 04/10/2023 1026   CO2 27 04/10/2023 1026   BUN 23 04/10/2023 1026   CREATININE 1.08 (H) 04/10/2023  1026      Component Value Date/Time   CALCIUM 9.8 04/10/2023 1026   ALKPHOS 43 04/10/2023 1026   AST 18 04/10/2023 1026   ALT 10 04/10/2023 1026   BILITOT 0.6 04/10/2023 1026

## 2023-04-11 ENCOUNTER — Encounter: Payer: Self-pay | Admitting: Hematology and Oncology

## 2023-04-11 ENCOUNTER — Other Ambulatory Visit: Payer: Self-pay

## 2023-04-12 ENCOUNTER — Telehealth: Payer: Self-pay | Admitting: Oncology

## 2023-04-12 NOTE — Telephone Encounter (Addendum)
Misty Deleon called and asked to have her port accessed before her CT scan at 10:30 on 05/17/23.  Scheduled a port flush appointment to access her port at 9:45.  She verbalized understanding and agreement.

## 2023-04-26 ENCOUNTER — Encounter: Payer: Self-pay | Admitting: Hematology and Oncology

## 2023-05-02 ENCOUNTER — Encounter: Payer: Self-pay | Admitting: Hematology and Oncology

## 2023-05-02 ENCOUNTER — Inpatient Hospital Stay: Payer: Medicare PPO | Attending: Radiation Oncology

## 2023-05-02 ENCOUNTER — Inpatient Hospital Stay: Payer: Medicare PPO

## 2023-05-02 VITALS — BP 160/70 | HR 58 | Temp 98.4°F

## 2023-05-02 DIAGNOSIS — Z5112 Encounter for antineoplastic immunotherapy: Secondary | ICD-10-CM | POA: Diagnosis not present

## 2023-05-02 DIAGNOSIS — R809 Proteinuria, unspecified: Secondary | ICD-10-CM | POA: Diagnosis not present

## 2023-05-02 DIAGNOSIS — C539 Malignant neoplasm of cervix uteri, unspecified: Secondary | ICD-10-CM | POA: Diagnosis not present

## 2023-05-02 DIAGNOSIS — Z79899 Other long term (current) drug therapy: Secondary | ICD-10-CM | POA: Insufficient documentation

## 2023-05-02 LAB — CBC WITH DIFFERENTIAL (CANCER CENTER ONLY)
Abs Immature Granulocytes: 0.02 10*3/uL (ref 0.00–0.07)
Basophils Absolute: 0 10*3/uL (ref 0.0–0.1)
Basophils Relative: 0 %
Eosinophils Absolute: 0.1 10*3/uL (ref 0.0–0.5)
Eosinophils Relative: 1 %
HCT: 34.1 % — ABNORMAL LOW (ref 36.0–46.0)
Hemoglobin: 11.3 g/dL — ABNORMAL LOW (ref 12.0–15.0)
Immature Granulocytes: 0 %
Lymphocytes Relative: 20 %
Lymphs Abs: 1.1 10*3/uL (ref 0.7–4.0)
MCH: 33 pg (ref 26.0–34.0)
MCHC: 33.1 g/dL (ref 30.0–36.0)
MCV: 99.7 fL (ref 80.0–100.0)
Monocytes Absolute: 0.5 10*3/uL (ref 0.1–1.0)
Monocytes Relative: 8 %
Neutro Abs: 3.9 10*3/uL (ref 1.7–7.7)
Neutrophils Relative %: 71 %
Platelet Count: 145 10*3/uL — ABNORMAL LOW (ref 150–400)
RBC: 3.42 MIL/uL — ABNORMAL LOW (ref 3.87–5.11)
RDW: 13.8 % (ref 11.5–15.5)
WBC Count: 5.6 10*3/uL (ref 4.0–10.5)
nRBC: 0 % (ref 0.0–0.2)

## 2023-05-02 LAB — CMP (CANCER CENTER ONLY)
ALT: 10 U/L (ref 0–44)
AST: 16 U/L (ref 15–41)
Albumin: 4.2 g/dL (ref 3.5–5.0)
Alkaline Phosphatase: 48 U/L (ref 38–126)
Anion gap: 5 (ref 5–15)
BUN: 30 mg/dL — ABNORMAL HIGH (ref 8–23)
CO2: 30 mmol/L (ref 22–32)
Calcium: 9.5 mg/dL (ref 8.9–10.3)
Chloride: 105 mmol/L (ref 98–111)
Creatinine: 1.11 mg/dL — ABNORMAL HIGH (ref 0.44–1.00)
GFR, Estimated: 53 mL/min — ABNORMAL LOW (ref >60)
Glucose, Bld: 87 mg/dL (ref 70–99)
Potassium: 4.4 mmol/L (ref 3.5–5.1)
Sodium: 140 mmol/L (ref 135–145)
Total Bilirubin: 0.7 mg/dL (ref 0.3–1.2)
Total Protein: 7.3 g/dL (ref 6.5–8.1)

## 2023-05-02 LAB — TOTAL PROTEIN, URINE DIPSTICK: Protein, ur: 100 mg/dL — AB

## 2023-05-02 MED ORDER — SODIUM CHLORIDE 0.9% FLUSH
10.0000 mL | Freq: Once | INTRAVENOUS | Status: AC
  Administered 2023-05-02: 10 mL

## 2023-05-02 MED ORDER — SODIUM CHLORIDE 0.9% FLUSH
10.0000 mL | INTRAVENOUS | Status: DC | PRN
  Administered 2023-05-02: 10 mL

## 2023-05-02 MED ORDER — SODIUM CHLORIDE 0.9 % IV SOLN
15.0000 mg/kg | Freq: Once | INTRAVENOUS | Status: AC
  Administered 2023-05-02: 1400 mg via INTRAVENOUS
  Filled 2023-05-02: qty 48

## 2023-05-02 MED ORDER — HEPARIN SOD (PORK) LOCK FLUSH 100 UNIT/ML IV SOLN
500.0000 [IU] | Freq: Once | INTRAVENOUS | Status: AC | PRN
  Administered 2023-05-02: 500 [IU]

## 2023-05-02 MED ORDER — SODIUM CHLORIDE 0.9 % IV SOLN
200.0000 mg | Freq: Once | INTRAVENOUS | Status: AC
  Administered 2023-05-02: 200 mg via INTRAVENOUS
  Filled 2023-05-02: qty 200

## 2023-05-02 MED ORDER — SODIUM CHLORIDE 0.9 % IV SOLN: Freq: Once | INTRAVENOUS | Status: AC

## 2023-05-02 NOTE — Progress Notes (Signed)
OK to treat with urine protein 100 & systolic BP 160 per Dr Mosetta Putt.

## 2023-05-02 NOTE — Progress Notes (Signed)
Ok to proceed with Urine Protein 100.  T.O. Dr Audie Clear, PharmD

## 2023-05-03 ENCOUNTER — Other Ambulatory Visit: Payer: Self-pay

## 2023-05-03 ENCOUNTER — Telehealth: Payer: Self-pay

## 2023-05-03 LAB — TSH: TSH: 3.355 u[IU]/mL (ref 0.350–4.500)

## 2023-05-03 LAB — T4: T4, Total: 8.4 ug/dL (ref 4.5–12.0)

## 2023-05-03 MED ORDER — LOSARTAN POTASSIUM 50 MG PO TABS: 50.0000 mg | ORAL_TABLET | Freq: Every evening | ORAL | 0 refills | Status: DC

## 2023-05-03 NOTE — Telephone Encounter (Signed)
Called regarding urine protein results being elevated yesterday. Ask about bp readings. She did not check at home yesterday. She checks her bp normally daily. She said that her BP is always elevated when she goes to MD appts. At home her bp normally is 150's to 140's and diastolic is in the 70's.

## 2023-05-03 NOTE — Telephone Encounter (Signed)
Called and given below message. She verbalized understanding and Rx sent to preferred pharmacy.

## 2023-05-03 NOTE — Telephone Encounter (Signed)
With her heavy proteinuria, we need to get it better controlled She needs to start checking it daily and bring her readings next visit Call in 50mg  losartan to pharmacy, 30 tabs no refills, take in the evening

## 2023-05-17 ENCOUNTER — Other Ambulatory Visit (HOSPITAL_COMMUNITY): Payer: Medicare PPO

## 2023-05-17 ENCOUNTER — Inpatient Hospital Stay: Payer: Medicare PPO | Attending: Radiation Oncology

## 2023-05-17 ENCOUNTER — Ambulatory Visit (HOSPITAL_COMMUNITY)
Admission: RE | Admit: 2023-05-17 | Discharge: 2023-05-17 | Disposition: A | Discharge status: Home / Self Care | Payer: Medicare PPO | Source: Ambulatory Visit | Attending: Hematology and Oncology | Admitting: Hematology and Oncology

## 2023-05-17 DIAGNOSIS — I129 Hypertensive chronic kidney disease with stage 1 through stage 4 chronic kidney disease, or unspecified chronic kidney disease: Secondary | ICD-10-CM | POA: Insufficient documentation

## 2023-05-17 DIAGNOSIS — E1122 Type 2 diabetes mellitus with diabetic chronic kidney disease: Secondary | ICD-10-CM | POA: Insufficient documentation

## 2023-05-17 DIAGNOSIS — Z5112 Encounter for antineoplastic immunotherapy: Secondary | ICD-10-CM | POA: Insufficient documentation

## 2023-05-17 DIAGNOSIS — C539 Malignant neoplasm of cervix uteri, unspecified: Secondary | ICD-10-CM | POA: Insufficient documentation

## 2023-05-17 DIAGNOSIS — R918 Other nonspecific abnormal finding of lung field: Secondary | ICD-10-CM | POA: Diagnosis not present

## 2023-05-17 DIAGNOSIS — Z79899 Other long term (current) drug therapy: Secondary | ICD-10-CM | POA: Insufficient documentation

## 2023-05-17 DIAGNOSIS — K5730 Diverticulosis of large intestine without perforation or abscess without bleeding: Secondary | ICD-10-CM | POA: Diagnosis not present

## 2023-05-17 DIAGNOSIS — N200 Calculus of kidney: Secondary | ICD-10-CM | POA: Diagnosis not present

## 2023-05-17 DIAGNOSIS — Z8541 Personal history of malignant neoplasm of cervix uteri: Secondary | ICD-10-CM | POA: Diagnosis not present

## 2023-05-17 DIAGNOSIS — N183 Chronic kidney disease, stage 3 unspecified: Secondary | ICD-10-CM | POA: Insufficient documentation

## 2023-05-17 DIAGNOSIS — Z95828 Presence of other vascular implants and grafts: Secondary | ICD-10-CM

## 2023-05-17 LAB — CBC WITH DIFFERENTIAL (CANCER CENTER ONLY)
Abs Immature Granulocytes: 0.02 10*3/uL (ref 0.00–0.07)
Basophils Absolute: 0 10*3/uL (ref 0.0–0.1)
Basophils Relative: 0 %
Eosinophils Absolute: 0.1 10*3/uL (ref 0.0–0.5)
Eosinophils Relative: 2 %
HCT: 36.6 % (ref 36.0–46.0)
Hemoglobin: 12.3 g/dL (ref 12.0–15.0)
Immature Granulocytes: 0 %
Lymphocytes Relative: 22 %
Lymphs Abs: 1.5 10*3/uL (ref 0.7–4.0)
MCH: 33 pg (ref 26.0–34.0)
MCHC: 33.6 g/dL (ref 30.0–36.0)
MCV: 98.1 fL (ref 80.0–100.0)
Monocytes Absolute: 0.6 10*3/uL (ref 0.1–1.0)
Monocytes Relative: 9 %
Neutro Abs: 4.7 10*3/uL (ref 1.7–7.7)
Neutrophils Relative %: 67 %
Platelet Count: 160 10*3/uL (ref 150–400)
RBC: 3.73 MIL/uL — ABNORMAL LOW (ref 3.87–5.11)
RDW: 14 % (ref 11.5–15.5)
WBC Count: 7 10*3/uL (ref 4.0–10.5)
nRBC: 0 % (ref 0.0–0.2)

## 2023-05-17 LAB — CMP (CANCER CENTER ONLY)
ALT: 14 U/L (ref 0–44)
AST: 18 U/L (ref 15–41)
Albumin: 4.1 g/dL (ref 3.5–5.0)
Alkaline Phosphatase: 45 U/L (ref 38–126)
Anion gap: 5 (ref 5–15)
BUN: 22 mg/dL (ref 8–23)
CO2: 29 mmol/L (ref 22–32)
Calcium: 9.1 mg/dL (ref 8.9–10.3)
Chloride: 107 mmol/L (ref 98–111)
Creatinine: 1.05 mg/dL — ABNORMAL HIGH (ref 0.44–1.00)
GFR, Estimated: 57 mL/min — ABNORMAL LOW (ref >60)
Glucose, Bld: 88 mg/dL (ref 70–99)
Potassium: 4.2 mmol/L (ref 3.5–5.1)
Sodium: 141 mmol/L (ref 135–145)
Total Bilirubin: 0.6 mg/dL (ref 0.3–1.2)
Total Protein: 6.9 g/dL (ref 6.5–8.1)

## 2023-05-17 MED ORDER — SODIUM CHLORIDE (PF) 0.9 % IJ SOLN
INTRAMUSCULAR | Status: AC
  Filled 2023-05-17: qty 50

## 2023-05-17 MED ORDER — HEPARIN SOD (PORK) LOCK FLUSH 100 UNIT/ML IV SOLN
500.0000 [IU] | Freq: Once | INTRAVENOUS | Status: AC
  Administered 2023-05-17: 500 [IU] via INTRAVENOUS

## 2023-05-17 MED ORDER — IOHEXOL 300 MG/ML  SOLN
100.0000 mL | Freq: Once | INTRAMUSCULAR | Status: AC | PRN
  Administered 2023-05-17: 100 mL via INTRAVENOUS

## 2023-05-22 ENCOUNTER — Inpatient Hospital Stay: Payer: Medicare PPO

## 2023-05-22 ENCOUNTER — Encounter: Payer: Self-pay | Admitting: Hematology and Oncology

## 2023-05-22 ENCOUNTER — Inpatient Hospital Stay (HOSPITAL_BASED_OUTPATIENT_CLINIC_OR_DEPARTMENT_OTHER): Payer: Medicare PPO | Admitting: Hematology and Oncology

## 2023-05-22 VITALS — BP 158/63 | HR 51 | Temp 99.0°F | Resp 18 | Ht 62.0 in | Wt 202.4 lb

## 2023-05-22 VITALS — BP 158/62 | HR 58 | Temp 98.2°F | Resp 18

## 2023-05-22 DIAGNOSIS — C539 Malignant neoplasm of cervix uteri, unspecified: Secondary | ICD-10-CM

## 2023-05-22 DIAGNOSIS — C538 Malignant neoplasm of overlapping sites of cervix uteri: Secondary | ICD-10-CM | POA: Diagnosis not present

## 2023-05-22 DIAGNOSIS — E1122 Type 2 diabetes mellitus with diabetic chronic kidney disease: Secondary | ICD-10-CM | POA: Diagnosis not present

## 2023-05-22 DIAGNOSIS — N183 Chronic kidney disease, stage 3 unspecified: Secondary | ICD-10-CM | POA: Diagnosis not present

## 2023-05-22 DIAGNOSIS — Z5112 Encounter for antineoplastic immunotherapy: Secondary | ICD-10-CM | POA: Diagnosis not present

## 2023-05-22 DIAGNOSIS — E1165 Type 2 diabetes mellitus with hyperglycemia: Secondary | ICD-10-CM | POA: Diagnosis not present

## 2023-05-22 DIAGNOSIS — I10 Essential (primary) hypertension: Secondary | ICD-10-CM

## 2023-05-22 DIAGNOSIS — I129 Hypertensive chronic kidney disease with stage 1 through stage 4 chronic kidney disease, or unspecified chronic kidney disease: Secondary | ICD-10-CM | POA: Diagnosis not present

## 2023-05-22 DIAGNOSIS — Z79899 Other long term (current) drug therapy: Secondary | ICD-10-CM | POA: Diagnosis not present

## 2023-05-22 LAB — CMP (CANCER CENTER ONLY)
ALT: 13 U/L (ref 0–44)
AST: 17 U/L (ref 15–41)
Albumin: 4.1 g/dL (ref 3.5–5.0)
Alkaline Phosphatase: 43 U/L (ref 38–126)
Anion gap: 6 (ref 5–15)
BUN: 24 mg/dL — ABNORMAL HIGH (ref 8–23)
CO2: 29 mmol/L (ref 22–32)
Calcium: 9.4 mg/dL (ref 8.9–10.3)
Chloride: 104 mmol/L (ref 98–111)
Creatinine: 1.2 mg/dL — ABNORMAL HIGH (ref 0.44–1.00)
GFR, Estimated: 48 mL/min — ABNORMAL LOW (ref >60)
Glucose, Bld: 95 mg/dL (ref 70–99)
Potassium: 4.2 mmol/L (ref 3.5–5.1)
Sodium: 139 mmol/L (ref 135–145)
Total Bilirubin: 0.8 mg/dL (ref 0.3–1.2)
Total Protein: 7.1 g/dL (ref 6.5–8.1)

## 2023-05-22 LAB — CBC WITH DIFFERENTIAL (CANCER CENTER ONLY)
Abs Immature Granulocytes: 0.02 10*3/uL (ref 0.00–0.07)
Basophils Absolute: 0 10*3/uL (ref 0.0–0.1)
Basophils Relative: 0 %
Eosinophils Absolute: 0.1 10*3/uL (ref 0.0–0.5)
Eosinophils Relative: 2 %
HCT: 36.3 % (ref 36.0–46.0)
Hemoglobin: 12.5 g/dL (ref 12.0–15.0)
Immature Granulocytes: 0 %
Lymphocytes Relative: 19 %
Lymphs Abs: 1.1 10*3/uL (ref 0.7–4.0)
MCH: 33.5 pg (ref 26.0–34.0)
MCHC: 34.4 g/dL (ref 30.0–36.0)
MCV: 97.3 fL (ref 80.0–100.0)
Monocytes Absolute: 0.4 10*3/uL (ref 0.1–1.0)
Monocytes Relative: 7 %
Neutro Abs: 4.2 10*3/uL (ref 1.7–7.7)
Neutrophils Relative %: 72 %
Platelet Count: 146 10*3/uL — ABNORMAL LOW (ref 150–400)
RBC: 3.73 MIL/uL — ABNORMAL LOW (ref 3.87–5.11)
RDW: 14.1 % (ref 11.5–15.5)
WBC Count: 6 10*3/uL (ref 4.0–10.5)
nRBC: 0 % (ref 0.0–0.2)

## 2023-05-22 LAB — TSH: TSH: 3.148 u[IU]/mL (ref 0.350–4.500)

## 2023-05-22 LAB — TOTAL PROTEIN, URINE DIPSTICK: Protein, ur: 300 mg/dL — AB

## 2023-05-22 MED ORDER — SODIUM CHLORIDE 0.9% FLUSH
10.0000 mL | Freq: Once | INTRAVENOUS | Status: AC
  Administered 2023-05-22: 10 mL

## 2023-05-22 MED ORDER — SODIUM CHLORIDE 0.9 % IV SOLN
200.0000 mg | Freq: Once | INTRAVENOUS | Status: AC
  Administered 2023-05-22: 200 mg via INTRAVENOUS
  Filled 2023-05-22: qty 200

## 2023-05-22 MED ORDER — LOSARTAN POTASSIUM 100 MG PO TABS: 100.0000 mg | ORAL_TABLET | Freq: Every evening | ORAL | 2 refills | Status: DC

## 2023-05-22 MED ORDER — ATENOLOL 100 MG PO TABS: 100.0000 mg | ORAL_TABLET | Freq: Every day | ORAL | 1 refills | Status: DC

## 2023-05-22 MED ORDER — SODIUM CHLORIDE 0.9 % IV SOLN: Freq: Once | INTRAVENOUS | Status: AC

## 2023-05-22 MED ORDER — SODIUM CHLORIDE 0.9 % IV SOLN: 15.0000 mg/kg | Freq: Once | INTRAVENOUS | Status: DC

## 2023-05-22 NOTE — Progress Notes (Signed)
[facility] OFFICE PROGRESS NOTE  Patient Care Team: Marletta Lor, NP as PCP - General (Nurse Practitioner)  ASSESSMENT & PLAN:  Cervical cancer [facility]) I have reviewed multiple imaging studies with the patient Overall, she has no signs of disease progression Unfortunately, due to heavy proteinuria, her bevacizumab is placed on hold I plan to reduce the dose of bevacizumab in the future and treat her blood pressure aggressively I will repeat imaging study in 6 months, due next around March 2025  Essential hypertension She has poorly controlled hypertension and heavy proteinuria Bevacizumab is placed on hold I will increase the dose of her losartan I will reduce the future dose of bevacizumab  Chronic kidney disease (CKD), stage III (moderate) (HCC) She has intermittent elevated serum creatinine She has multiple risk factors including diabetes and hypertension She will continue hydration and risk factor modification  Orders Placed This Encounter  Procedures   Total Protein, Urine dipstick    Standing Status:   Future    Standing Expiration Date:   06/11/2024   TSH    Standing Status:   Future    Standing Expiration Date:   06/11/2024   T4    Standing Status:   Future    Standing Expiration Date:   06/11/2024   CBC with Differential (Cancer Center Only)    Standing Status:   Future    Standing Expiration Date:   06/11/2024   CMP (Cancer Center only)    Standing Status:   Future    Standing Expiration Date:   06/11/2024   Total Protein, Urine dipstick    Standing Status:   Future    Standing Expiration Date:   07/02/2024   TSH    Standing Status:   Future    Standing Expiration Date:   07/02/2024   T4    Standing Status:   Future    Standing Expiration Date:   07/02/2024   CBC with Differential (Cancer Center Only)    Standing Status:   Future    Standing Expiration Date:   07/02/2024   CMP (Cancer Center only)    Standing Status:   Future    Standing  Expiration Date:   07/02/2024    All questions were answered. The patient knows to call the clinic with any problems, questions or concerns. The total time spent in the appointment was 40 minutes encounter with patients including review of chart and various tests results, discussions about plan of care and coordination of care plan   Artis Delay, MD 05/22/2023 1:28 PM  INTERVAL HISTORY: Please see below for problem oriented charting. she returns for treatment follow-up She brought with her documented blood pressure from home which is elevated She is not symptomatic We discussed test results and imaging study We discussed changes to her medications  REVIEW OF SYSTEMS:   Constitutional: Denies fevers, chills or abnormal weight loss Eyes: Denies blurriness of vision Ears, nose, mouth, throat, and face: Denies mucositis or sore throat Respiratory: Denies cough, dyspnea or wheezes Cardiovascular: Denies palpitation, chest discomfort or lower extremity swelling Gastrointestinal:  Denies nausea, heartburn or change in bowel habits Skin: Denies abnormal skin rashes Lymphatics: Denies new lymphadenopathy or easy bruising Neurological:Denies numbness, tingling or new weaknesses Behavioral/Psych: Mood is stable, no new changes  All other systems were reviewed with the patient and are negative.  I have reviewed the past medical history, past surgical history, social history and family history with the patient and they are unchanged from previous  note.  ALLERGIES:  is allergic to lisinopril.  MEDICATIONS:  Current Outpatient Medications  Medication Sig Dispense Refill   calcium carbonate (TUMS - DOSED IN MG ELEMENTAL CALCIUM) 500 MG chewable tablet Chew 1 tablet by mouth daily.     cholecalciferol (VITAMIN D3) 25 MCG (1000 UNIT) tablet Take 2,000 Units by mouth daily.     amLODipine (NORVASC) 10 MG tablet Take 1 tablet (10 mg total) by mouth daily. 30 tablet 6   atenolol (TENORMIN) 100 MG  tablet Take 1 tablet (100 mg total) by mouth daily. Take in the morning 90 tablet 1   diphenhydrAMINE (BENADRYL ALLERGY) 25 MG tablet Take 2 tablets (50 mg total) by mouth every 6 (six) hours as needed for allergies. 30 tablet 0   ezetimibe (ZETIA) 10 MG tablet Take 10 mg by mouth in the morning.     famotidine (PEPCID) 20 MG tablet Take 1 tablet (20 mg total) by mouth daily for 5 days. 5 tablet 0   glimepiride (AMARYL) 1 MG tablet Take 1 mg by mouth daily with breakfast.     lidocaine-prilocaine (EMLA) cream Apply 1 Application topically as needed (for port access). 30 g 3   losartan (COZAAR) 100 MG tablet Take 1 tablet (100 mg total) by mouth every evening. 60 tablet 2   magnesium oxide (MAG-OX) 400 (240 Mg) MG tablet Take 1 tablet (400 mg total) by mouth daily. 30 tablet 3   ondansetron (ZOFRAN) 8 MG tablet Take 1 tablet (8 mg total) by mouth every 8 (eight) hours as needed for nausea or vomiting. Start on the third day after chemotherapy. 30 tablet 1   prochlorperazine (COMPAZINE) 10 MG tablet Take 1 tablet (10 mg total) by mouth every 6 (six) hours as needed for nausea or vomiting. 30 tablet 1   rosuvastatin (CRESTOR) 40 MG tablet Take 40 mg by mouth at bedtime.     sitaGLIPtin (JANUVIA) 100 MG tablet Take 50 mg by mouth in the morning.     TYLENOL 500 MG tablet Take 500-1,000 mg by mouth every 6 (six) hours as needed for mild pain or headache.     vitamin B-12 (CYANOCOBALAMIN) 1000 MCG tablet Take 1,000 mcg by mouth in the morning.     No current facility-administered medications for this visit.    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   08/27/2020 Imaging   MRI pelvis Large cervical mass with pelvic adenopathy, at least  T2B N1 disease. Question of involvement of posterior urinary bladder (T4) for which additional images have been requested. Patient will return for additional images to answer this question. (Thinner section axial T2 without fat sat and sagittal T2 weighted imaging also without fat saturation.)   Small field-of-view images show that the parametrial extension comes in very close proximity to the RIGHT ureter.   08/30/2020 Initial Diagnosis   Cervical cancer (HCC)   08/30/2020 Cancer Staging   Staging form: Cervix Uteri, AJCC Version 9 - Clinical stage from 08/30/2020: FIGO Stage IVB (rcT2, cN1, pM1) - Signed by Artis Delay, MD on 09/01/2022 Stage prefix: Recurrence   09/01/2020 PET scan   1. Large cervical mass extending into the endometrial canal to the level of the uterine fundus, associated with pelvic adenopathy to the level of the RIGHT common  iliac chain. Please refer to MRI for further detail. 2. No signs of solid organ uptake or metastatic disease to the chest. 3. Subcutaneous nodule with marked increased metabolic activity, correlation with direct clinical inspection is suggested to exclude cutaneous neoplasm or subcutaneous nodule in this location. Complicated sebaceous cyst could also potentially have this appearance. 4. Uptake in the anal canal could likely physiologic. Given the intense nature of the uptake would suggest correlation with direct clinical inspection/examination. 5. Uptake in axillary lymph nodes with activity less than mediastinal blood pool likely small reactive lymph nodes, attention on follow-up. Morphologic features also with benign appearance.   09/16/2020 Procedure   Successful placement of a power injectable Port-A-Cath via the right internal jugular vein. The catheter is ready for immediate use.     09/20/2020 - 10/25/2020 Chemotherapy   She received cisplatin chemo with concurrent radiation       09/22/2020 - 12/08/2020 Radiation Therapy   Radiation  Treatment Dates: 09/22/2020 through 12/08/2020   Site: Pelvis Technique: IMRT Total Dose (Gy): 45/45 Dose per Fx (Gy): 1.8 Completed Fx: 25/25 Beam Energies: 6x   Site: pelvic boost Technique: IMRT Total Dose (Gy): 7.2/7.2 Dose per Fx (Gy): 1.8 Completed Fx: 4/4 Beam Energies: 6x   Site: Cervix boost Technique: HDR-brachytherapy Total Dose (Gy): 27.5/27.5 Dose per Fx (Gy): 5.5 Completed Fx: 5/5 Beam Energies: Ir-192     03/17/2021 PET scan   1. Mark positive response to therapy. 2. Resolution of metabolic activity within the uterine body. 3. Resolution of size and metabolic activity of pelvic lymph nodes. 4. No evidence of new disease outside of the pelvis.   08/10/2022 PET scan   1. New hypermetabolic bilateral pulmonary nodules and hypermetabolic cervical and hilar lymph nodes, consistent with metastatic disease. 2. Hypermetabolic focus in the anterior right third costochondral junction without discrete CT correlate is also compatible with metastatic disease. 3. Prior hysterectomy without suspicious hypermetabolic nodularity along the vaginal cuff. 4.  Aortic Atherosclerosis (ICD10-I70.0).   08/24/2022 Pathology Results   A. SOFT TISSUE NODULE, RIGHT CHEST WALL, NEEDLE CORE BIOPSY:  Poorly differentiated carcinoma.  See comment.   COMMENT:  The carcinoma is characterized by nest of malignant epithelioid cells which focally has features suggestive of squamous differentiation morphologically.  Immunohistochemistry is positive with cytokeratin 7, PAX8, MOC-31 and estrogen receptor.  There is patchy positivity with p16, progesterone receptor and TTF-1.  The tumor is negative with p40, cytokeratin 5/6, p63, CDX2, cytokeratin 20 and WT-1.  The immunophenotype is consistent with adenocarcinoma and suggestive of a gynecologic primary.    09/08/2022 -  Chemotherapy   Patient is on Treatment Plan : OVARIAN Carboplatin + Paclitaxel + Bevacizumab q21d      11/07/2022 Imaging   1.  Decreased size of the pulmonary nodules and mediastinal adenopathy, consistent with treatment response. 2. Similar appearance of the previously hypermetabolic soft tissue subjacent to the right anterior third costochondral cartilage. 3. No convincing evidence of new or progressive disease in the chest, abdomen or pelvis. 4. Rectal wall thickening with stranding in the mesorectal fat and diffuse thickening of the urinary bladder wall, nonspecific but possibly reflecting post radiation change. 5. Nonobstructive right renal stones measure up to 6 mm in the renal pelvis. 6. Colonic diverticulosis without findings of acute diverticulitis. 7.  Aortic Atherosclerosis (ICD10-I70.0).   02/26/2023 Imaging   CT CHEST ABDOMEN PELVIS W CONTRAST  Result Date: 02/24/2023 CLINICAL DATA:  Cervical cancer, assess treatment response * Tracking Code: BO * EXAM: CT CHEST, ABDOMEN,  AND PELVIS WITH CONTRAST TECHNIQUE: Multidetector CT imaging of the chest, abdomen and pelvis was performed following the standard protocol during bolus administration of intravenous contrast. RADIATION DOSE REDUCTION: This exam was performed according to the departmental dose-optimization program which includes automated exposure control, adjustment of the mA and/or kV according to patient size and/or use of iterative reconstruction technique. CONTRAST:  OMNIPAQUE IOHEXOL 300 MG/ML  SOLN COMPARISON:  CT chest abdomen pelvis, 11/07/2022, PET-CT, 08/10/2022 FINDINGS: CT CHEST FINDINGS Cardiovascular: Right chest port catheter. Normal heart size. No pericardial effusion. Mediastinum/Nodes: No persistently enlarged mediastinal, hilar, or axillary lymph nodes. Benign calcified left hilar lymph nodes. Small hiatal hernia. Thyroid gland, trachea, and esophagus demonstrate no significant findings. Lungs/Pleura: Near complete resolution of previously seen bilateral pulmonary nodules, only tiny treated residual remaining, for example a 0.5 cm irregular  nodule of the peripheral right apex (series 4, image 28) and a linear residua in the medial left upper lobe (series 4, image 42) benign calcified nodule of the anterior left upper lobe, for which no further follow-up or characterization is required (series 4, image 46). Scarring and volume loss of the left lung base (series 4, image 105). No pleural effusion or pneumothorax. Musculoskeletal: No chest wall abnormality. Previously FDG avid soft tissue nodule in the anterior right third chondral cartilage is not well appreciated by today's CT (series 2, image 24). No acute osseous findings. CT ABDOMEN PELVIS FINDINGS Hepatobiliary: No solid liver abnormality is seen. No gallstones, gallbladder wall thickening, or biliary dilatation. Pancreas: Unremarkable. No pancreatic ductal dilatation or surrounding inflammatory changes. Spleen: Normal in size. Punctuate granulomatous calcifications of the spleen. Adrenals/Urinary Tract: Adrenal glands are unremarkable. Nonobstructive calculi of the superior pole of the right kidney as well as the right renal pelvis, renal pelvic calculus measuring 0.8 cm (series 2, image 66). The left kidney is normal, without renal calculi, solid lesion, or hydronephrosis. Unchanged diffuse bladder wall thickening with adjacent fat stranding (series 6, image 106) Stomach/Bowel: Stomach is within normal limits. Appendix is not clearly visualized and may be surgically absent. No evidence of bowel wall thickening, distention, or inflammatory changes. Moderate burden of stool throughout the colon and rectum. Sigmoid diverticulosis. Unchanged long segment wall thickening involving the distal sigmoid colon and rectum, particularly with circumferential wall thickening and perirectal fat spiculation of the rectum (series 2, image 101) Vascular/Lymphatic: Aortic atherosclerosis. No enlarged abdominal or pelvic lymph nodes. Reproductive: No directly visualized cervical mass. Other: No abdominal wall hernia  or abnormality. No ascites. Musculoskeletal: No acute osseous findings. Unchanged sclerotic inferior endplate deformity of L5 (series 6, image 100). IMPRESSION: 1. No directly visualized cervical mass. 2. Near complete resolution of previously seen pulmonary nodules, with only small residua, consistent with continued treatment response of pulmonary metastatic disease. 3. No persistently enlarged mediastinal lymph nodes, consistent with sustained treatment response of nodal metastatic disease. 4. No appreciable CT correlate of a previously seen FDG avid soft tissue nodule within the anterior right third costochondral cartilage. 5. Unchanged long segment wall thickening involving the distal sigmoid colon and rectum, particularly with circumferential wall thickening and perirectal fat spiculation of the rectum, as well as diffuse bladder wall thickening. 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 Attention on follow-up. 6. Nonobstructive right nephrolithiasis. Aortic Atherosclerosis (ICD10-I70.0). Electronically Signed   By: Jearld Lesch M.D.   On: 02/24/2023 16:31      05/17/2023 Imaging   CT CHEST ABDOMEN PELVIS W CONTRAST  Result Date: 05/20/2023 CLINICAL DATA:  History of cervical  cancer, assess treatment response. * Tracking Code: BO * EXAM: CT CHEST, ABDOMEN, AND PELVIS WITH CONTRAST TECHNIQUE: Multidetector CT imaging of the chest, abdomen and pelvis was performed following the standard protocol during bolus administration of intravenous contrast. RADIATION DOSE REDUCTION: This exam was performed according to the departmental dose-optimization program which includes automated exposure control, adjustment of the mA and/or kV according to patient size and/or use of iterative reconstruction technique. CONTRAST:  OMNIPAQUE IOHEXOL 300 MG/ML  SOLN COMPARISON:  Multiple priors including CT February 23, 2023. FINDINGS: CT CHEST FINDINGS Cardiovascular: Accessed right chest  Port-A-Cath with tip at the superior cavoatrial junction no central pulmonary embolus on this nondedicated study. Normal size heart. No significant pericardial effusion/thickening. Mediastinum/Nodes: No suspicious thyroid nodule. No pathologically enlarged mediastinal, hilar or axillary lymph nodes small hiatal hernia Lungs/Pleura: Stable pulmonary nodules. For reference the previously indexed right upper lobe pulmonary nodule measures 4 mm on image 36/6, unchanged. Cluster of new tiny nodules in the right lower lobe measure up to 3 mm on image 85/6. Scattered atelectasis/scarring. Left upper lobe calcified granuloma. No pleural effusion. No pneumothorax Musculoskeletal: No aggressive lytic or blastic lesion of bone. Multilevel degenerative changes spine. CT ABDOMEN PELVIS FINDINGS Hepatobiliary: No suspicious hepatic lesion. Gallbladder is unremarkable. No biliary ductal dilation. Pancreas: No pancreatic ductal dilation or evidence of acute inflammation. Spleen: Calcified splenic granulomata Adrenals/Urinary Tract: Bilateral adrenal glands appear normal. Nonobstructive bilateral renal calculi including a 7 mm calculus in the right renal pelvis. Similar perivesicular stranding besides a nondistended urinary bladder Stomach/Bowel: Stomach is unremarkable for degree of distension. No pathologic dilation of small or large bowel. Colonic diverticulosis. Similar mild wall thickening of the distal sigmoid colon/rectum with adjacent fat stranding. Vascular/Lymphatic: Aortic atherosclerosis. Smooth IVC contours. The portal, splenic and superior mesenteric veins are patent. No pathologically enlarged abdominal or pelvic lymph nodes. Reproductive: No discrete cervical mass. No suspicious adnexal lesion. Other: No significant abdominopelvic free fluid. No discrete peritoneal or omental nodularity Musculoskeletal: No aggressive lytic or blastic lesion of bone. Similar sclerotic endplate deformity at L5. Probable postradiation  change in the sacrum. Multilevel degenerative change of the spine. IMPRESSION: 1. Previously indexed and non indexed pulmonary nodules are stable. Cluster of new tiny nodules in the right lower lobe measure up to 3 mm, favored infectious/inflammatory. Suggest attention on short-term interval follow-up chest CT. 2. No convincing evidence of new or progressive metastatic disease in the chest, abdomen or pelvis. 3. Similar mild perivesicular stranding and mild wall thickening of the distal sigmoid colon/rectum with adjacent fat stranding, most consistent with postradiation proctitis/cystitis. 4. Nonobstructive bilateral renal calculi including a 7 mm calculus in the right renal pelvis. 5.  Aortic Atherosclerosis (ICD10-I70.0). Electronically Signed   By: Maudry Mayhew M.D.   On: 05/20/2023 15:13      Malignant neoplasm of cervix (HCC)  09/01/2020 Initial Diagnosis   Malignant neoplasm of cervix (HCC)   09/20/2020 - 10/25/2020 Chemotherapy   She received cisplatin chemo with concurrent radiation       09/22/2020 - 12/08/2020 Radiation Therapy   Radiation Treatment Dates: 09/22/2020 through 12/08/2020   Site: Pelvis Technique: IMRT Total Dose (Gy): 45/45 Dose per Fx (Gy): 1.8 Completed Fx: 25/25 Beam Energies: 6x   Site: pelvic boost Technique: IMRT Total Dose (Gy): 7.2/7.2 Dose per Fx (Gy): 1.8 Completed Fx: 4/4 Beam Energies: 6x   Site: Cervix boost Technique: HDR-brachytherapy Total Dose (Gy): 27.5/27.5 Dose per Fx (Gy): 5.5 Completed Fx: 5/5 Beam Energies: Ir-192   09/08/2022 -  Chemotherapy   Patient is on Treatment Plan : OVARIAN Carboplatin + Paclitaxel + Bevacizumab q21d        PHYSICAL EXAMINATION: ECOG PERFORMANCE STATUS: 0 - Asymptomatic  Vitals:   05/22/23 1023  BP: (!) 158/63  Pulse: (!) 51  Resp: 18  Temp: 99 F (37.2 C)  SpO2: 100%   Filed Weights   05/22/23 1023  Weight: 202 lb 6.4 oz (91.8 kg)    GENERAL:alert, no distress and  comfortable  LABORATORY DATA:  I have reviewed the data as listed    Component Value Date/Time   NA 139 05/22/2023 1007   K 4.2 05/22/2023 1007   CL 104 05/22/2023 1007   CO2 29 05/22/2023 1007   GLUCOSE 95 05/22/2023 1007   BUN 24 (H) 05/22/2023 1007   CREATININE 1.20 (H) 05/22/2023 1007   CALCIUM 9.4 05/22/2023 1007   PROT 7.1 05/22/2023 1007   ALBUMIN 4.1 05/22/2023 1007   AST 17 05/22/2023 1007   ALT 13 05/22/2023 1007   ALKPHOS 43 05/22/2023 1007   BILITOT 0.8 05/22/2023 1007   GFRNONAA 48 (L) 05/22/2023 1007    No results found for: "SPEP", "UPEP"  Lab Results  Component Value Date   WBC 6.0 05/22/2023   NEUTROABS 4.2 05/22/2023   HGB 12.5 05/22/2023   HCT 36.3 05/22/2023   MCV 97.3 05/22/2023   PLT 146 (L) 05/22/2023      Chemistry      Component Value Date/Time   NA 139 05/22/2023 1007   K 4.2 05/22/2023 1007   CL 104 05/22/2023 1007   CO2 29 05/22/2023 1007   BUN 24 (H) 05/22/2023 1007   CREATININE 1.20 (H) 05/22/2023 1007      Component Value Date/Time   CALCIUM 9.4 05/22/2023 1007   ALKPHOS 43 05/22/2023 1007   AST 17 05/22/2023 1007   ALT 13 05/22/2023 1007   BILITOT 0.8 05/22/2023 1007       RADIOGRAPHIC STUDIES: I have reviewed imaging study with the patient I have personally reviewed the radiological images as listed and agreed with the findings in the report. CT CHEST ABDOMEN PELVIS W CONTRAST  Result Date: 05/20/2023 CLINICAL DATA:  History of cervical cancer, assess treatment response. * Tracking Code: BO * EXAM: CT CHEST, ABDOMEN, AND PELVIS WITH CONTRAST TECHNIQUE: Multidetector CT imaging of the chest, abdomen and pelvis was performed following the standard protocol during bolus administration of intravenous contrast. RADIATION DOSE REDUCTION: This exam was performed according to the departmental dose-optimization program which includes automated exposure control, adjustment of the mA and/or kV according to patient size and/or use of  iterative reconstruction technique. CONTRAST:  OMNIPAQUE IOHEXOL 300 MG/ML  SOLN COMPARISON:  Multiple priors including CT February 23, 2023. FINDINGS: CT CHEST FINDINGS Cardiovascular: Accessed right chest Port-A-Cath with tip at the superior cavoatrial junction no central pulmonary embolus on this nondedicated study. Normal size heart. No significant pericardial effusion/thickening. Mediastinum/Nodes: No suspicious thyroid nodule. No pathologically enlarged mediastinal, hilar or axillary lymph nodes small hiatal hernia Lungs/Pleura: Stable pulmonary nodules. For reference the previously indexed right upper lobe pulmonary nodule measures 4 mm on image 36/6, unchanged. Cluster of new tiny nodules in the right lower lobe measure up to 3 mm on image 85/6. Scattered atelectasis/scarring. Left upper lobe calcified granuloma. No pleural effusion. No pneumothorax Musculoskeletal: No aggressive lytic or blastic lesion of bone. Multilevel degenerative changes spine. CT ABDOMEN PELVIS FINDINGS Hepatobiliary: No suspicious hepatic lesion. Gallbladder is unremarkable. No biliary ductal dilation. Pancreas: No  pancreatic ductal dilation or evidence of acute inflammation. Spleen: Calcified splenic granulomata Adrenals/Urinary Tract: Bilateral adrenal glands appear normal. Nonobstructive bilateral renal calculi including a 7 mm calculus in the right renal pelvis. Similar perivesicular stranding besides a nondistended urinary bladder Stomach/Bowel: Stomach is unremarkable for degree of distension. No pathologic dilation of small or large bowel. Colonic diverticulosis. Similar mild wall thickening of the distal sigmoid colon/rectum with adjacent fat stranding. Vascular/Lymphatic: Aortic atherosclerosis. Smooth IVC contours. The portal, splenic and superior mesenteric veins are patent. No pathologically enlarged abdominal or pelvic lymph nodes. Reproductive: No discrete cervical mass. No suspicious adnexal lesion. Other: No  significant abdominopelvic free fluid. No discrete peritoneal or omental nodularity Musculoskeletal: No aggressive lytic or blastic lesion of bone. Similar sclerotic endplate deformity at L5. Probable postradiation change in the sacrum. Multilevel degenerative change of the spine. IMPRESSION: 1. Previously indexed and non indexed pulmonary nodules are stable. Cluster of new tiny nodules in the right lower lobe measure up to 3 mm, favored infectious/inflammatory. Suggest attention on short-term interval follow-up chest CT. 2. No convincing evidence of new or progressive metastatic disease in the chest, abdomen or pelvis. 3. Similar mild perivesicular stranding and mild wall thickening of the distal sigmoid colon/rectum with adjacent fat stranding, most consistent with postradiation proctitis/cystitis. 4. Nonobstructive bilateral renal calculi including a 7 mm calculus in the right renal pelvis. 5.  Aortic Atherosclerosis (ICD10-I70.0). Electronically Signed   By: Maudry Mayhew M.D.   On: 05/20/2023 15:13

## 2023-05-22 NOTE — Assessment & Plan Note (Signed)
She has poorly controlled hypertension and heavy proteinuria Bevacizumab is placed on hold I will increase the dose of her losartan I will reduce the future dose of bevacizumab

## 2023-05-22 NOTE — Assessment & Plan Note (Addendum)
I have reviewed multiple imaging studies with the patient Overall, she has no signs of disease progression Unfortunately, due to heavy proteinuria, her bevacizumab is placed on hold I plan to reduce the dose of bevacizumab in the future and treat her blood pressure aggressively I will repeat imaging study in 6 months, due next around March 2025

## 2023-05-22 NOTE — Assessment & Plan Note (Signed)
She has intermittent elevated serum creatinine She has multiple risk factors including diabetes and hypertension She will continue hydration and risk factor modification

## 2023-05-23 ENCOUNTER — Other Ambulatory Visit: Payer: Self-pay

## 2023-05-24 ENCOUNTER — Other Ambulatory Visit: Payer: Self-pay | Admitting: *Deleted

## 2023-05-24 ENCOUNTER — Other Ambulatory Visit: Payer: Self-pay

## 2023-05-24 DIAGNOSIS — C539 Malignant neoplasm of cervix uteri, unspecified: Secondary | ICD-10-CM

## 2023-05-25 LAB — T4: T4, Total: 7.4 ug/dL (ref 4.5–12.0)

## 2023-06-11 ENCOUNTER — Other Ambulatory Visit: Payer: Self-pay | Admitting: Hematology and Oncology

## 2023-06-12 ENCOUNTER — Inpatient Hospital Stay (HOSPITAL_BASED_OUTPATIENT_CLINIC_OR_DEPARTMENT_OTHER): Payer: Medicare PPO

## 2023-06-12 ENCOUNTER — Encounter: Payer: Self-pay | Admitting: Hematology and Oncology

## 2023-06-12 ENCOUNTER — Inpatient Hospital Stay: Payer: Medicare PPO

## 2023-06-12 ENCOUNTER — Inpatient Hospital Stay: Payer: Medicare PPO | Attending: Radiation Oncology | Admitting: Hematology and Oncology

## 2023-06-12 VITALS — BP 125/77 | HR 84 | Resp 18

## 2023-06-12 VITALS — BP 129/50 | HR 85 | Temp 98.6°F | Resp 18 | Ht 62.0 in | Wt 202.0 lb

## 2023-06-12 DIAGNOSIS — Z79899 Other long term (current) drug therapy: Secondary | ICD-10-CM | POA: Diagnosis not present

## 2023-06-12 DIAGNOSIS — R351 Nocturia: Secondary | ICD-10-CM | POA: Insufficient documentation

## 2023-06-12 DIAGNOSIS — C539 Malignant neoplasm of cervix uteri, unspecified: Secondary | ICD-10-CM

## 2023-06-12 DIAGNOSIS — Z5112 Encounter for antineoplastic immunotherapy: Secondary | ICD-10-CM | POA: Insufficient documentation

## 2023-06-12 DIAGNOSIS — G8929 Other chronic pain: Secondary | ICD-10-CM | POA: Diagnosis not present

## 2023-06-12 DIAGNOSIS — M545 Low back pain, unspecified: Secondary | ICD-10-CM | POA: Insufficient documentation

## 2023-06-12 DIAGNOSIS — I10 Essential (primary) hypertension: Secondary | ICD-10-CM | POA: Diagnosis not present

## 2023-06-12 LAB — CMP (CANCER CENTER ONLY)
ALT: 14 U/L (ref 0–44)
AST: 22 U/L (ref 15–41)
Albumin: 3.8 g/dL (ref 3.5–5.0)
Alkaline Phosphatase: 49 U/L (ref 38–126)
Anion gap: 7 (ref 5–15)
BUN: 15 mg/dL (ref 8–23)
CO2: 27 mmol/L (ref 22–32)
Calcium: 9.1 mg/dL (ref 8.9–10.3)
Chloride: 101 mmol/L (ref 98–111)
Creatinine: 1.27 mg/dL — ABNORMAL HIGH (ref 0.44–1.00)
GFR, Estimated: 45 mL/min — ABNORMAL LOW (ref >60)
Glucose, Bld: 144 mg/dL — ABNORMAL HIGH (ref 70–99)
Potassium: 3.8 mmol/L (ref 3.5–5.1)
Sodium: 135 mmol/L (ref 135–145)
Total Bilirubin: 1.3 mg/dL — ABNORMAL HIGH (ref 0.3–1.2)
Total Protein: 6.9 g/dL (ref 6.5–8.1)

## 2023-06-12 LAB — CBC WITH DIFFERENTIAL (CANCER CENTER ONLY)
Abs Immature Granulocytes: 0.01 10*3/uL (ref 0.00–0.07)
Basophils Absolute: 0 10*3/uL (ref 0.0–0.1)
Basophils Relative: 0 %
Eosinophils Absolute: 0.1 10*3/uL (ref 0.0–0.5)
Eosinophils Relative: 2 %
HCT: 29.9 % — ABNORMAL LOW (ref 36.0–46.0)
Hemoglobin: 10.4 g/dL — ABNORMAL LOW (ref 12.0–15.0)
Immature Granulocytes: 0 %
Lymphocytes Relative: 11 %
Lymphs Abs: 0.5 10*3/uL — ABNORMAL LOW (ref 0.7–4.0)
MCH: 33.5 pg (ref 26.0–34.0)
MCHC: 34.8 g/dL (ref 30.0–36.0)
MCV: 96.5 fL (ref 80.0–100.0)
Monocytes Absolute: 0.4 10*3/uL (ref 0.1–1.0)
Monocytes Relative: 10 %
Neutro Abs: 3.5 10*3/uL (ref 1.7–7.7)
Neutrophils Relative %: 77 %
Platelet Count: 122 10*3/uL — ABNORMAL LOW (ref 150–400)
RBC: 3.1 MIL/uL — ABNORMAL LOW (ref 3.87–5.11)
RDW: 15.8 % — ABNORMAL HIGH (ref 11.5–15.5)
WBC Count: 4.5 10*3/uL (ref 4.0–10.5)
nRBC: 0 % (ref 0.0–0.2)

## 2023-06-12 LAB — TOTAL PROTEIN, URINE DIPSTICK: Protein, ur: 100 mg/dL — AB

## 2023-06-12 LAB — TSH: TSH: 4.266 u[IU]/mL (ref 0.350–4.500)

## 2023-06-12 MED ORDER — SODIUM CHLORIDE 0.9 % IV SOLN
200.0000 mg | Freq: Once | INTRAVENOUS | Status: AC
  Administered 2023-06-12: 200 mg via INTRAVENOUS
  Filled 2023-06-12: qty 200

## 2023-06-12 MED ORDER — SODIUM CHLORIDE 0.9% FLUSH
10.0000 mL | Freq: Once | INTRAVENOUS | Status: AC
  Administered 2023-06-12: 10 mL

## 2023-06-12 MED ORDER — HEPARIN SOD (PORK) LOCK FLUSH 100 UNIT/ML IV SOLN
500.0000 [IU] | Freq: Once | INTRAVENOUS | Status: AC | PRN
  Administered 2023-06-12: 500 [IU]

## 2023-06-12 MED ORDER — LIDOCAINE-PRILOCAINE 2.5-2.5 % EX CREA
1.0000 | TOPICAL_CREAM | CUTANEOUS | 3 refills | Status: DC | PRN
Start: 2023-06-12 — End: 2023-10-26

## 2023-06-12 MED ORDER — SODIUM CHLORIDE 0.9% FLUSH
10.0000 mL | INTRAVENOUS | Status: DC | PRN
  Administered 2023-06-12: 10 mL

## 2023-06-12 MED ORDER — SODIUM CHLORIDE 0.9 % IV SOLN: Freq: Once | INTRAVENOUS | Status: AC

## 2023-06-12 MED ORDER — SODIUM CHLORIDE 0.9 % IV SOLN
7.5000 mg/kg | Freq: Once | INTRAVENOUS | Status: AC
  Administered 2023-06-12: 700 mg via INTRAVENOUS
  Filled 2023-06-12: qty 12

## 2023-06-12 NOTE — Patient Instructions (Signed)
[place] [facility]  Discharge Instructions: Thank you for choosing [facility] to provide your oncology and hematology care.   If you have a lab appointment with the Cancer Center, please go directly to the Cancer Center and check in at the registration area.   Wear comfortable clothing and clothing appropriate for easy access to any Portacath or PICC line.   We strive to give you quality time with your provider. You may need to reschedule your appointment if you arrive late (15 or more minutes).  Arriving late affects you and other patients whose appointments are after yours.  Also, if you miss three or more appointments without notifying the office, you may be dismissed from the clinic at the provider's discretion.      For prescription refill requests, have your pharmacy contact our office and allow 72 hours for refills to be completed.    Today you received the following chemotherapy and/or immunotherapy agents: Keytruda, Bevacizumab.       To help prevent nausea and vomiting after your treatment, we encourage you to take your nausea medication as directed.  BELOW ARE SYMPTOMS THAT SHOULD BE REPORTED IMMEDIATELY: *FEVER GREATER THAN 100.4 F (38 C) OR HIGHER *CHILLS OR SWEATING *NAUSEA AND VOMITING THAT IS NOT CONTROLLED WITH YOUR NAUSEA MEDICATION *UNUSUAL SHORTNESS OF BREATH *UNUSUAL BRUISING OR BLEEDING *URINARY PROBLEMS (pain or burning when urinating, or frequent urination) *BOWEL PROBLEMS (unusual diarrhea, constipation, pain near the anus) TENDERNESS IN MOUTH AND THROAT WITH OR WITHOUT PRESENCE OF ULCERS (sore throat, sores in mouth, or a toothache) UNUSUAL RASH, SWELLING OR PAIN  UNUSUAL VAGINAL DISCHARGE OR ITCHING   Items with * indicate a potential emergency and should be followed up as soon as possible or go to the Emergency Department if any problems should occur.  Please show the CHEMOTHERAPY ALERT CARD or IMMUNOTHERAPY ALERT  CARD at check-in to the Emergency Department and triage nurse.  Should you have questions after your visit or need to cancel or reschedule your appointment, please contact [place] [facility]  Dept: [phone]  and follow the prompts.  Office hours are 8:00 a.m. to 4:30 p.m. Monday - Friday. Please note that voicemails left after 4:00 p.m. may not be returned until the following business day.  We are closed weekends and major holidays. You have access to a nurse at all times for urgent questions. Please call the main number to the clinic Dept: [phone] and follow the prompts.   For any non-urgent questions, you may also contact your provider using MyChart. We now offer e-Visits for anyone 25 and older to request care online for non-urgent symptoms. For details visit [email or web address].   Also download the MyChart app! Go to the app store, search "MyChart", open the app, select [place], and log in with your MyChart username and password.

## 2023-06-12 NOTE — Progress Notes (Signed)
[facility] OFFICE PROGRESS NOTE  Patient Care Team: Marletta Lor, NP as PCP - General (Nurse Practitioner)  HISTORY OF PRESENTING ILLNESS: Discussed the use of AI scribe software for clinical note transcription with the patient, who gave verbal consent to proceed.  History of Present Illness   The patient, with a history of hypertension and kidney disease, presents for a follow-up visit and treatment today with bevacizumab and pembrolizumab. On her last visit, I was concerned about elevated blood pressure. She reports that her blood pressure has been much better since the last visit. Review of her urine protein levels have improved, dropping from 300 to 100.  In addition to her primary conditions, the patient has been experiencing back pain, which has radiated down to her knee and hip. She has been managing the pain through posture adjustments and physical therapy, guided by her daughter who is a physical therapist. The patient expresses a preference for weight loss as a method of managing her back pain, rather than pursuing injections or additional physical therapy.  The patient also mentions needing a refill of amla cream, which she uses frequently. She has not had any problems since June, when she was taking steroids. She is no longer on steroids, and her blood sugar levels have improved since discontinuing the medication. The patient declines a flu shot, stating a preference for natural immunity.         Assessment and Plan    Hypertension Improved control with decreased urine protein from 300 to 100. -Continue current management.  Cancer treatment with Bevacizumab/pembrolizumab Previous dose of 15mg /kg caused hypertension. -Reduce Bevacizumab dose by half for safety. -Continue Keytruda at current dose. -Next scan after Christmas.  Back pain Likely degenerative disc disease. Pain radiating to knee and hip. -Encouraged weight loss and good posture as initial  management.  Prescription refill -Send prescription for amla cream to [facility] on [address].  Follow-up appointments -Next treatment on November 12, then December 3. -Discuss plans for Christmas period at December appointment. -Consider follow-up after New Year if tests remain stable.  General Health Maintenance -Declined flu shot.          Orders Placed This Encounter  Procedures   Total Protein, Urine dipstick    Standing Status:   Future    Standing Expiration Date:   07/23/2024   TSH    Standing Status:   Future    Standing Expiration Date:   07/23/2024   T4    Standing Status:   Future    Standing Expiration Date:   07/23/2024   CBC with Differential (Cancer Center Only)    Standing Status:   Future    Standing Expiration Date:   07/23/2024   CMP (Cancer Center only)    Standing Status:   Future    Standing Expiration Date:   07/23/2024   Total Protein, Urine dipstick    Standing Status:   Future    Standing Expiration Date:   08/13/2024   TSH    Standing Status:   Future    Standing Expiration Date:   08/13/2024   T4    Standing Status:   Future    Standing Expiration Date:   08/13/2024   CBC with Differential (Cancer Center Only)    Standing Status:   Future    Standing Expiration Date:   08/13/2024   CMP (Cancer Center only)    Standing Status:   Future    Standing Expiration Date:  08/13/2024    All questions were answered. The patient knows to call the clinic with any problems, questions or concerns. The total time spent in the appointment was 30 minutes encounter with patients including review of chart and various tests results, discussions about plan of care and coordination of care plan   Artis Delay, MD 06/12/2023 10:25 AM  REVIEW OF SYSTEMS:   Constitutional: Denies fevers, chills or abnormal weight loss Eyes: Denies blurriness of vision Ears, nose, mouth, throat, and face: Denies mucositis or sore throat Respiratory: Denies  cough, dyspnea or wheezes Cardiovascular: Denies palpitation, chest discomfort or lower extremity swelling Gastrointestinal:  Denies nausea, heartburn or change in bowel habits Skin: Denies abnormal skin rashes Lymphatics: Denies new lymphadenopathy or easy bruising Neurological:Denies numbness, tingling or new weaknesses Behavioral/Psych: Mood is stable, no new changes  All other systems were reviewed with the patient and are negative.  I have reviewed the past medical history, past surgical history, social history and family history with the patient and they are unchanged from previous note.  ALLERGIES:  is allergic to lisinopril.  MEDICATIONS:  Current Outpatient Medications  Medication Sig Dispense Refill   amLODipine (NORVASC) 10 MG tablet Take 1 tablet (10 mg total) by mouth daily. 30 tablet 6   atenolol (TENORMIN) 100 MG tablet Take 1 tablet (100 mg total) by mouth daily. Take in the morning 90 tablet 1   calcium carbonate (TUMS - DOSED IN MG ELEMENTAL CALCIUM) 500 MG chewable tablet Chew 1 tablet by mouth daily.     cholecalciferol (VITAMIN D3) 25 MCG (1000 UNIT) tablet Take 2,000 Units by mouth daily.     diphenhydrAMINE (BENADRYL ALLERGY) 25 MG tablet Take 2 tablets (50 mg total) by mouth every 6 (six) hours as needed for allergies. 30 tablet 0   ezetimibe (ZETIA) 10 MG tablet Take 10 mg by mouth in the morning.     famotidine (PEPCID) 20 MG tablet Take 1 tablet (20 mg total) by mouth daily for 5 days. 5 tablet 0   glimepiride (AMARYL) 1 MG tablet Take 1 mg by mouth daily with breakfast.     lidocaine-prilocaine (EMLA) cream Apply 1 Application topically as needed (for port access). 30 g 3   losartan (COZAAR) 100 MG tablet Take 1 tablet (100 mg total) by mouth every evening. 60 tablet 2   losartan (COZAAR) 50 MG tablet TAKE 1 TABLET(50 MG) BY MOUTH EVERY EVENING 30 tablet 0   magnesium oxide (MAG-OX) 400 (240 Mg) MG tablet Take 1 tablet (400 mg total) by mouth daily. 30 tablet 3    ondansetron (ZOFRAN) 8 MG tablet Take 1 tablet (8 mg total) by mouth every 8 (eight) hours as needed for nausea or vomiting. Start on the third day after chemotherapy. 30 tablet 1   prochlorperazine (COMPAZINE) 10 MG tablet Take 1 tablet (10 mg total) by mouth every 6 (six) hours as needed for nausea or vomiting. 30 tablet 1   rosuvastatin (CRESTOR) 40 MG tablet Take 40 mg by mouth at bedtime.     sitaGLIPtin (JANUVIA) 100 MG tablet Take 50 mg by mouth in the morning.     TYLENOL 500 MG tablet Take 500-1,000 mg by mouth every 6 (six) hours as needed for mild pain or headache.     vitamin B-12 (CYANOCOBALAMIN) 1000 MCG tablet Take 1,000 mcg by mouth in the morning.     No current facility-administered medications for this visit.   Facility-Administered Medications Ordered in Other Visits  Medication Dose Route  Frequency Provider Last Rate Last Admin   bevacizumab-awwb (MVASI) 700 mg in sodium chloride 0.9 % 100 mL chemo infusion  7.5 mg/kg (Treatment Plan Recorded) Intravenous Once Bertis Ruddy, Sherill Mangen, MD       heparin lock flush 100 unit/mL  500 Units Intracatheter Once PRN Bertis Ruddy, Kyara Boxer, MD       pembrolizumab (KEYTRUDA) 200 mg in sodium chloride 0.9 % 50 mL chemo infusion  200 mg Intravenous Once Sire Poet, MD       sodium chloride flush (NS) 0.9 % injection 10 mL  10 mL Intracatheter PRN Artis Delay, MD        SUMMARY OF ONCOLOGIC HISTORY: Oncology History Overview Note  Adenocarcinoma, 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   08/27/2020 Imaging   MRI pelvis Large cervical mass with pelvic adenopathy, at least T2B N1 disease. Question of involvement of posterior urinary bladder (T4) for which additional images have been requested. Patient will return for additional images to answer this question. (Thinner  section axial T2 without fat sat and sagittal T2 weighted imaging also without fat saturation.)   Small field-of-view images show that the parametrial extension comes in very close proximity to the RIGHT ureter.   08/30/2020 Initial Diagnosis   Cervical cancer (HCC)   08/30/2020 Cancer Staging   Staging form: Cervix Uteri, AJCC Version 9 - Clinical stage from 08/30/2020: FIGO Stage IVB (rcT2, cN1, pM1) - Signed by Artis Delay, MD on 09/01/2022 Stage prefix: Recurrence   09/01/2020 PET scan   1. Large cervical mass extending into the endometrial canal to the level of the uterine fundus, associated with pelvic adenopathy to the level of the RIGHT common iliac chain. Please refer to MRI for further detail. 2. No signs of solid organ uptake or metastatic disease to the chest. 3. Subcutaneous nodule with marked increased metabolic activity, correlation with direct clinical inspection is suggested to exclude cutaneous neoplasm or subcutaneous nodule in this location. Complicated sebaceous cyst could also potentially have this appearance. 4. Uptake in the anal canal could likely physiologic. Given the intense nature of the uptake would suggest correlation with direct clinical inspection/examination. 5. Uptake in axillary lymph nodes with activity less than mediastinal blood pool likely small reactive lymph nodes, attention on follow-up. Morphologic features also with benign appearance.   09/16/2020 Procedure   Successful placement of a power injectable Port-A-Cath via the right internal jugular vein. The catheter is ready for immediate use.     09/20/2020 - 10/25/2020 Chemotherapy   She received cisplatin chemo with concurrent radiation       09/22/2020 - 12/08/2020 Radiation Therapy   Radiation Treatment Dates: 09/22/2020 through 12/08/2020   Site: Pelvis Technique: IMRT Total Dose (Gy): 45/45 Dose per Fx (Gy): 1.8 Completed Fx: 25/25 Beam Energies: 6x   Site: pelvic boost Technique:  IMRT Total Dose (Gy): 7.2/7.2 Dose per Fx (Gy): 1.8 Completed Fx: 4/4 Beam Energies: 6x   Site: Cervix boost Technique: HDR-brachytherapy Total Dose (Gy): 27.5/27.5 Dose per Fx (Gy): 5.5 Completed Fx: 5/5 Beam Energies: Ir-192     03/17/2021 PET scan   1. Mark positive response to therapy. 2. Resolution of metabolic activity within the uterine body. 3. Resolution of size and metabolic activity of pelvic lymph nodes. 4. No evidence of new disease outside of the pelvis.   08/10/2022 PET scan  1. New hypermetabolic bilateral pulmonary nodules and hypermetabolic cervical and hilar lymph nodes, consistent with metastatic disease. 2. Hypermetabolic focus in the anterior right third costochondral junction without discrete CT correlate is also compatible with metastatic disease. 3. Prior hysterectomy without suspicious hypermetabolic nodularity along the vaginal cuff. 4.  Aortic Atherosclerosis (ICD10-I70.0).   08/24/2022 Pathology Results   A. SOFT TISSUE NODULE, RIGHT CHEST WALL, NEEDLE CORE BIOPSY:  Poorly differentiated carcinoma.  See comment.   COMMENT:  The carcinoma is characterized by nest of malignant epithelioid cells which focally has features suggestive of squamous differentiation morphologically.  Immunohistochemistry is positive with cytokeratin 7, PAX8, MOC-31 and estrogen receptor.  There is patchy positivity with p16, progesterone receptor and TTF-1.  The tumor is negative with p40, cytokeratin 5/6, p63, CDX2, cytokeratin 20 and WT-1.  The immunophenotype is consistent with adenocarcinoma and suggestive of a gynecologic primary.    09/08/2022 -  Chemotherapy   Patient is on Treatment Plan : OVARIAN Carboplatin + Paclitaxel + Bevacizumab q21d      11/07/2022 Imaging   1. Decreased size of the pulmonary nodules and mediastinal adenopathy, consistent with treatment response. 2. Similar appearance of the previously hypermetabolic soft tissue subjacent to the right anterior  third costochondral cartilage. 3. No convincing evidence of new or progressive disease in the chest, abdomen or pelvis. 4. Rectal wall thickening with stranding in the mesorectal fat and diffuse thickening of the urinary bladder wall, nonspecific but possibly reflecting post radiation change. 5. Nonobstructive right renal stones measure up to 6 mm in the renal pelvis. 6. Colonic diverticulosis without findings of acute diverticulitis. 7.  Aortic Atherosclerosis (ICD10-I70.0).   02/26/2023 Imaging   CT CHEST ABDOMEN PELVIS W CONTRAST  Result Date: 02/24/2023 CLINICAL DATA:  Cervical cancer, assess treatment response * Tracking Code: BO * EXAM: CT CHEST, ABDOMEN, AND PELVIS WITH CONTRAST TECHNIQUE: Multidetector CT imaging of the chest, abdomen and pelvis was performed following the standard protocol during bolus administration of intravenous contrast. RADIATION DOSE REDUCTION: This exam was performed according to the departmental dose-optimization program which includes automated exposure control, adjustment of the mA and/or kV according to patient size and/or use of iterative reconstruction technique. CONTRAST:  OMNIPAQUE IOHEXOL 300 MG/ML  SOLN COMPARISON:  CT chest abdomen pelvis, 11/07/2022, PET-CT, 08/10/2022 FINDINGS: CT CHEST FINDINGS Cardiovascular: Right chest port catheter. Normal heart size. No pericardial effusion. Mediastinum/Nodes: No persistently enlarged mediastinal, hilar, or axillary lymph nodes. Benign calcified left hilar lymph nodes. Small hiatal hernia. Thyroid gland, trachea, and esophagus demonstrate no significant findings. Lungs/Pleura: Near complete resolution of previously seen bilateral pulmonary nodules, only tiny treated residual remaining, for example a 0.5 cm irregular nodule of the peripheral right apex (series 4, image 28) and a linear residua in the medial left upper lobe (series 4, image 42) benign calcified nodule of the anterior left upper lobe, for which no  further follow-up or characterization is required (series 4, image 46). Scarring and volume loss of the left lung base (series 4, image 105). No pleural effusion or pneumothorax. Musculoskeletal: No chest wall abnormality. Previously FDG avid soft tissue nodule in the anterior right third chondral cartilage is not well appreciated by today's CT (series 2, image 24). No acute osseous findings. CT ABDOMEN PELVIS FINDINGS Hepatobiliary: No solid liver abnormality is seen. No gallstones, gallbladder wall thickening, or biliary dilatation. Pancreas: Unremarkable. No pancreatic ductal dilatation or surrounding inflammatory changes. Spleen: Normal in size. Punctuate granulomatous calcifications of the spleen. Adrenals/Urinary Tract: Adrenal glands are unremarkable. Nonobstructive  calculi of the superior pole of the right kidney as well as the right renal pelvis, renal pelvic calculus measuring 0.8 cm (series 2, image 66). The left kidney is normal, without renal calculi, solid lesion, or hydronephrosis. Unchanged diffuse bladder wall thickening with adjacent fat stranding (series 6, image 106) Stomach/Bowel: Stomach is within normal limits. Appendix is not clearly visualized and may be surgically absent. No evidence of bowel wall thickening, distention, or inflammatory changes. Moderate burden of stool throughout the colon and rectum. Sigmoid diverticulosis. Unchanged long segment wall thickening involving the distal sigmoid colon and rectum, particularly with circumferential wall thickening and perirectal fat spiculation of the rectum (series 2, image 101) Vascular/Lymphatic: Aortic atherosclerosis. No enlarged abdominal or pelvic lymph nodes. Reproductive: No directly visualized cervical mass. Other: No abdominal wall hernia or abnormality. No ascites. Musculoskeletal: No acute osseous findings. Unchanged sclerotic inferior endplate deformity of L5 (series 6, image 100). IMPRESSION: 1. No directly visualized cervical  mass. 2. Near complete resolution of previously seen pulmonary nodules, with only small residua, consistent with continued treatment response of pulmonary metastatic disease. 3. No persistently enlarged mediastinal lymph nodes, consistent with sustained treatment response of nodal metastatic disease. 4. No appreciable CT correlate of a previously seen FDG avid soft tissue nodule within the anterior right third costochondral cartilage. 5. Unchanged long segment wall thickening involving the distal sigmoid colon and rectum, particularly with circumferential wall thickening and perirectal fat spiculation of the rectum, as well as diffuse bladder wall thickening. 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 Attention on follow-up. 6. Nonobstructive right nephrolithiasis. Aortic Atherosclerosis (ICD10-I70.0). Electronically Signed   By: Jearld Lesch M.D.   On: 02/24/2023 16:31      05/17/2023 Imaging   CT CHEST ABDOMEN PELVIS W CONTRAST  Result Date: 05/20/2023 CLINICAL DATA:  History of cervical cancer, assess treatment response. * Tracking Code: BO * EXAM: CT CHEST, ABDOMEN, AND PELVIS WITH CONTRAST TECHNIQUE: Multidetector CT imaging of the chest, abdomen and pelvis was performed following the standard protocol during bolus administration of intravenous contrast. RADIATION DOSE REDUCTION: This exam was performed according to the departmental dose-optimization program which includes automated exposure control, adjustment of the mA and/or kV according to patient size and/or use of iterative reconstruction technique. CONTRAST:  OMNIPAQUE IOHEXOL 300 MG/ML  SOLN COMPARISON:  Multiple priors including CT February 23, 2023. FINDINGS: CT CHEST FINDINGS Cardiovascular: Accessed right chest Port-A-Cath with tip at the superior cavoatrial junction no central pulmonary embolus on this nondedicated study. Normal size heart. No significant pericardial effusion/thickening. Mediastinum/Nodes:  No suspicious thyroid nodule. No pathologically enlarged mediastinal, hilar or axillary lymph nodes small hiatal hernia Lungs/Pleura: Stable pulmonary nodules. For reference the previously indexed right upper lobe pulmonary nodule measures 4 mm on image 36/6, unchanged. Cluster of new tiny nodules in the right lower lobe measure up to 3 mm on image 85/6. Scattered atelectasis/scarring. Left upper lobe calcified granuloma. No pleural effusion. No pneumothorax Musculoskeletal: No aggressive lytic or blastic lesion of bone. Multilevel degenerative changes spine. CT ABDOMEN PELVIS FINDINGS Hepatobiliary: No suspicious hepatic lesion. Gallbladder is unremarkable. No biliary ductal dilation. Pancreas: No pancreatic ductal dilation or evidence of acute inflammation. Spleen: Calcified splenic granulomata Adrenals/Urinary Tract: Bilateral adrenal glands appear normal. Nonobstructive bilateral renal calculi including a 7 mm calculus in the right renal pelvis. Similar perivesicular stranding besides a nondistended urinary bladder Stomach/Bowel: Stomach is unremarkable for degree of distension. No pathologic dilation of small or large bowel. Colonic diverticulosis. Similar mild wall thickening of  the distal sigmoid colon/rectum with adjacent fat stranding. Vascular/Lymphatic: Aortic atherosclerosis. Smooth IVC contours. The portal, splenic and superior mesenteric veins are patent. No pathologically enlarged abdominal or pelvic lymph nodes. Reproductive: No discrete cervical mass. No suspicious adnexal lesion. Other: No significant abdominopelvic free fluid. No discrete peritoneal or omental nodularity Musculoskeletal: No aggressive lytic or blastic lesion of bone. Similar sclerotic endplate deformity at L5. Probable postradiation change in the sacrum. Multilevel degenerative change of the spine. IMPRESSION: 1. Previously indexed and non indexed pulmonary nodules are stable. Cluster of new tiny nodules in the right lower lobe  measure up to 3 mm, favored infectious/inflammatory. Suggest attention on short-term interval follow-up chest CT. 2. No convincing evidence of new or progressive metastatic disease in the chest, abdomen or pelvis. 3. Similar mild perivesicular stranding and mild wall thickening of the distal sigmoid colon/rectum with adjacent fat stranding, most consistent with postradiation proctitis/cystitis. 4. Nonobstructive bilateral renal calculi including a 7 mm calculus in the right renal pelvis. 5.  Aortic Atherosclerosis (ICD10-I70.0). Electronically Signed   By: Maudry Mayhew M.D.   On: 05/20/2023 15:13      Malignant neoplasm of cervix (HCC)  09/01/2020 Initial Diagnosis   Malignant neoplasm of cervix (HCC)   09/20/2020 - 10/25/2020 Chemotherapy   She received cisplatin chemo with concurrent radiation       09/22/2020 - 12/08/2020 Radiation Therapy   Radiation Treatment Dates: 09/22/2020 through 12/08/2020   Site: Pelvis Technique: IMRT Total Dose (Gy): 45/45 Dose per Fx (Gy): 1.8 Completed Fx: 25/25 Beam Energies: 6x   Site: pelvic boost Technique: IMRT Total Dose (Gy): 7.2/7.2 Dose per Fx (Gy): 1.8 Completed Fx: 4/4 Beam Energies: 6x   Site: Cervix boost Technique: HDR-brachytherapy Total Dose (Gy): 27.5/27.5 Dose per Fx (Gy): 5.5 Completed Fx: 5/5 Beam Energies: Ir-192   09/08/2022 -  Chemotherapy   Patient is on Treatment Plan : OVARIAN Carboplatin + Paclitaxel + Bevacizumab q21d        PHYSICAL EXAMINATION: ECOG PERFORMANCE STATUS: 1 - Symptomatic but completely ambulatory  Vitals:   06/12/23 0952  BP: (!) 129/50  Pulse: 85  Resp: 18  Temp: 98.6 F (37 C)  SpO2: 99%   Filed Weights   06/12/23 0952  Weight: 202 lb (91.6 kg)    GENERAL:alert, no distress and comfortable  LABORATORY DATA:  I have reviewed the data as listed    Component Value Date/Time   NA 135 06/12/2023 0917   K 3.8 06/12/2023 0917   CL 101 06/12/2023 0917   CO2 27 06/12/2023 0917    GLUCOSE 144 (H) 06/12/2023 0917   BUN 15 06/12/2023 0917   CREATININE 1.27 (H) 06/12/2023 0917   CALCIUM 9.1 06/12/2023 0917   PROT 6.9 06/12/2023 0917   ALBUMIN 3.8 06/12/2023 0917   AST 22 06/12/2023 0917   ALT 14 06/12/2023 0917   ALKPHOS 49 06/12/2023 0917   BILITOT 1.3 (H) 06/12/2023 0917   GFRNONAA 45 (L) 06/12/2023 0917    No results found for: "SPEP", "UPEP"  Lab Results  Component Value Date   WBC 4.5 06/12/2023   NEUTROABS 3.5 06/12/2023   HGB 10.4 (L) 06/12/2023   HCT 29.9 (L) 06/12/2023   MCV 96.5 06/12/2023   PLT 122 (L) 06/12/2023      Chemistry      Component Value Date/Time   NA 135 06/12/2023 0917   K 3.8 06/12/2023 0917   CL 101 06/12/2023 0917   CO2 27 06/12/2023 0917   BUN 15 06/12/2023 0917  CREATININE 1.27 (H) 06/12/2023 0917      Component Value Date/Time   CALCIUM 9.1 06/12/2023 0917   ALKPHOS 49 06/12/2023 0917   AST 22 06/12/2023 0917   ALT 14 06/12/2023 0917   BILITOT 1.3 (H) 06/12/2023 0917       RADIOGRAPHIC STUDIES: I have personally reviewed the radiological images as listed and agreed with the findings in the report. CT CHEST ABDOMEN PELVIS W CONTRAST  Result Date: 05/20/2023 CLINICAL DATA:  History of cervical cancer, assess treatment response. * Tracking Code: BO * EXAM: CT CHEST, ABDOMEN, AND PELVIS WITH CONTRAST TECHNIQUE: Multidetector CT imaging of the chest, abdomen and pelvis was performed following the standard protocol during bolus administration of intravenous contrast. RADIATION DOSE REDUCTION: This exam was performed according to the departmental dose-optimization program which includes automated exposure control, adjustment of the mA and/or kV according to patient size and/or use of iterative reconstruction technique. CONTRAST:  OMNIPAQUE IOHEXOL 300 MG/ML  SOLN COMPARISON:  Multiple priors including CT February 23, 2023. FINDINGS: CT CHEST FINDINGS Cardiovascular: Accessed right chest Port-A-Cath with tip at the  superior cavoatrial junction no central pulmonary embolus on this nondedicated study. Normal size heart. No significant pericardial effusion/thickening. Mediastinum/Nodes: No suspicious thyroid nodule. No pathologically enlarged mediastinal, hilar or axillary lymph nodes small hiatal hernia Lungs/Pleura: Stable pulmonary nodules. For reference the previously indexed right upper lobe pulmonary nodule measures 4 mm on image 36/6, unchanged. Cluster of new tiny nodules in the right lower lobe measure up to 3 mm on image 85/6. Scattered atelectasis/scarring. Left upper lobe calcified granuloma. No pleural effusion. No pneumothorax Musculoskeletal: No aggressive lytic or blastic lesion of bone. Multilevel degenerative changes spine. CT ABDOMEN PELVIS FINDINGS Hepatobiliary: No suspicious hepatic lesion. Gallbladder is unremarkable. No biliary ductal dilation. Pancreas: No pancreatic ductal dilation or evidence of acute inflammation. Spleen: Calcified splenic granulomata Adrenals/Urinary Tract: Bilateral adrenal glands appear normal. Nonobstructive bilateral renal calculi including a 7 mm calculus in the right renal pelvis. Similar perivesicular stranding besides a nondistended urinary bladder Stomach/Bowel: Stomach is unremarkable for degree of distension. No pathologic dilation of small or large bowel. Colonic diverticulosis. Similar mild wall thickening of the distal sigmoid colon/rectum with adjacent fat stranding. Vascular/Lymphatic: Aortic atherosclerosis. Smooth IVC contours. The portal, splenic and superior mesenteric veins are patent. No pathologically enlarged abdominal or pelvic lymph nodes. Reproductive: No discrete cervical mass. No suspicious adnexal lesion. Other: No significant abdominopelvic free fluid. No discrete peritoneal or omental nodularity Musculoskeletal: No aggressive lytic or blastic lesion of bone. Similar sclerotic endplate deformity at L5. Probable postradiation change in the sacrum.  Multilevel degenerative change of the spine. IMPRESSION: 1. Previously indexed and non indexed pulmonary nodules are stable. Cluster of new tiny nodules in the right lower lobe measure up to 3 mm, favored infectious/inflammatory. Suggest attention on short-term interval follow-up chest CT. 2. No convincing evidence of new or progressive metastatic disease in the chest, abdomen or pelvis. 3. Similar mild perivesicular stranding and mild wall thickening of the distal sigmoid colon/rectum with adjacent fat stranding, most consistent with postradiation proctitis/cystitis. 4. Nonobstructive bilateral renal calculi including a 7 mm calculus in the right renal pelvis. 5.  Aortic Atherosclerosis (ICD10-I70.0). Electronically Signed   By: Maudry Mayhew M.D.   On: 05/20/2023 15:13

## 2023-06-13 LAB — T4: T4, Total: 9.6 ug/dL (ref 4.5–12.0)

## 2023-06-20 ENCOUNTER — Other Ambulatory Visit: Payer: Self-pay

## 2023-06-25 ENCOUNTER — Other Ambulatory Visit: Payer: Self-pay | Admitting: Hematology and Oncology

## 2023-06-25 ENCOUNTER — Telehealth: Payer: Self-pay | Admitting: Oncology

## 2023-06-25 DIAGNOSIS — C539 Malignant neoplasm of cervix uteri, unspecified: Secondary | ICD-10-CM

## 2023-06-25 DIAGNOSIS — N390 Urinary tract infection, site not specified: Secondary | ICD-10-CM

## 2023-06-25 NOTE — Telephone Encounter (Signed)
Called Misty Deleon back and scheduled a port flush/lab apt for 11:15 and appointment with Dr. Bertis Ruddy at 12:00.

## 2023-06-25 NOTE — Telephone Encounter (Signed)
I have opening tomorrow, please schedule lab before appt I will order test

## 2023-06-25 NOTE — Telephone Encounter (Signed)
Misty Deleon called and said she has been having burning with urination for the past 6 days.  It started after having a hard stool.  She has had this happen a couple times in the past and it would go away after a few hours.  This time it hasn't gone away.  She notified her PCP, Misty Lor, NP who gave her 5 days of nitrofurantoin. Misty Deleon reports that her symptoms have not improved at all.  She did not have a UA or culture done.  Her PCP is now thinking she may have torn something and would like her to see Dr. Pricilla Holm for an exam.    Misty Deleon denies having a fever, vaginal bleeding or hematuria.  She reports not having any energy or appetite.  Advised I will notify Dr. Bertis Ruddy and Dr. Winferd Humphrey office and call her back with recommendations.

## 2023-06-26 ENCOUNTER — Inpatient Hospital Stay: Payer: Medicare PPO

## 2023-06-26 ENCOUNTER — Telehealth: Payer: Self-pay

## 2023-06-26 ENCOUNTER — Inpatient Hospital Stay: Payer: Medicare PPO | Admitting: Hematology and Oncology

## 2023-06-26 ENCOUNTER — Encounter: Payer: Self-pay | Admitting: Hematology and Oncology

## 2023-06-26 VITALS — BP 118/51 | HR 58 | Temp 98.0°F | Resp 18 | Ht 62.0 in | Wt 197.8 lb

## 2023-06-26 DIAGNOSIS — R351 Nocturia: Secondary | ICD-10-CM | POA: Diagnosis not present

## 2023-06-26 DIAGNOSIS — C539 Malignant neoplasm of cervix uteri, unspecified: Secondary | ICD-10-CM | POA: Diagnosis not present

## 2023-06-26 DIAGNOSIS — G8929 Other chronic pain: Secondary | ICD-10-CM | POA: Diagnosis not present

## 2023-06-26 DIAGNOSIS — Z5112 Encounter for antineoplastic immunotherapy: Secondary | ICD-10-CM | POA: Diagnosis not present

## 2023-06-26 DIAGNOSIS — I10 Essential (primary) hypertension: Secondary | ICD-10-CM | POA: Diagnosis not present

## 2023-06-26 DIAGNOSIS — N390 Urinary tract infection, site not specified: Secondary | ICD-10-CM

## 2023-06-26 DIAGNOSIS — M545 Low back pain, unspecified: Secondary | ICD-10-CM | POA: Diagnosis not present

## 2023-06-26 DIAGNOSIS — Z79899 Other long term (current) drug therapy: Secondary | ICD-10-CM | POA: Diagnosis not present

## 2023-06-26 LAB — CMP (CANCER CENTER ONLY)
ALT: 9 U/L (ref 0–44)
AST: 19 U/L (ref 15–41)
Albumin: 3.7 g/dL (ref 3.5–5.0)
Alkaline Phosphatase: 50 U/L (ref 38–126)
Anion gap: 7 (ref 5–15)
BUN: 12 mg/dL (ref 8–23)
CO2: 28 mmol/L (ref 22–32)
Calcium: 9.3 mg/dL (ref 8.9–10.3)
Chloride: 101 mmol/L (ref 98–111)
Creatinine: 1.23 mg/dL — ABNORMAL HIGH (ref 0.44–1.00)
GFR, Estimated: 47 mL/min — ABNORMAL LOW (ref >60)
Glucose, Bld: 149 mg/dL — ABNORMAL HIGH (ref 70–99)
Potassium: 3.8 mmol/L (ref 3.5–5.1)
Sodium: 136 mmol/L (ref 135–145)
Total Bilirubin: 1.1 mg/dL (ref 0.3–1.2)
Total Protein: 7 g/dL (ref 6.5–8.1)

## 2023-06-26 LAB — CBC WITH DIFFERENTIAL (CANCER CENTER ONLY)
Abs Immature Granulocytes: 0.02 10*3/uL (ref 0.00–0.07)
Basophils Absolute: 0 10*3/uL (ref 0.0–0.1)
Basophils Relative: 1 %
Eosinophils Absolute: 0.2 10*3/uL (ref 0.0–0.5)
Eosinophils Relative: 4 %
HCT: 32.3 % — ABNORMAL LOW (ref 36.0–46.0)
Hemoglobin: 10.9 g/dL — ABNORMAL LOW (ref 12.0–15.0)
Immature Granulocytes: 0 %
Lymphocytes Relative: 24 %
Lymphs Abs: 1.3 10*3/uL (ref 0.7–4.0)
MCH: 32.8 pg (ref 26.0–34.0)
MCHC: 33.7 g/dL (ref 30.0–36.0)
MCV: 97.3 fL (ref 80.0–100.0)
Monocytes Absolute: 0.5 10*3/uL (ref 0.1–1.0)
Monocytes Relative: 10 %
Neutro Abs: 3.4 10*3/uL (ref 1.7–7.7)
Neutrophils Relative %: 61 %
Platelet Count: 202 10*3/uL (ref 150–400)
RBC: 3.32 MIL/uL — ABNORMAL LOW (ref 3.87–5.11)
RDW: 16.4 % — ABNORMAL HIGH (ref 11.5–15.5)
WBC Count: 5.6 10*3/uL (ref 4.0–10.5)
nRBC: 0 % (ref 0.0–0.2)

## 2023-06-26 LAB — URINALYSIS, COMPLETE (UACMP) WITH MICROSCOPIC
Bacteria, UA: NONE SEEN
Bilirubin Urine: NEGATIVE
Glucose, UA: NEGATIVE mg/dL
Ketones, ur: NEGATIVE mg/dL
Nitrite: NEGATIVE
Protein, ur: 100 mg/dL — AB
RBC / HPF: >50 RBC/hpf (ref 0–5)
Specific Gravity, Urine: 1.02 (ref 1.005–1.030)
WBC, UA: >50 WBC/hpf (ref 0–5)
pH: 5 (ref 5.0–8.0)

## 2023-06-26 MED ORDER — AMOXICILLIN-POT CLAVULANATE 875-125 MG PO TABS: 1.0000 | ORAL_TABLET | Freq: Two times a day (BID) | ORAL | 0 refills | Status: DC

## 2023-06-26 MED ORDER — SODIUM CHLORIDE 0.9% FLUSH
10.0000 mL | Freq: Once | INTRAVENOUS | Status: AC
  Administered 2023-06-26: 10 mL

## 2023-06-26 MED ORDER — HEPARIN SOD (PORK) LOCK FLUSH 100 UNIT/ML IV SOLN
500.0000 [IU] | Freq: Once | INTRAVENOUS | Status: AC
  Administered 2023-06-26: 500 [IU]

## 2023-06-26 NOTE — Telephone Encounter (Signed)
Called and left a message. Dr. Bertis Ruddy sent Augmentin Rx to her pharmacy for abnormal UA. Start antibiotic today. Ask her to call the office back for questions.

## 2023-06-26 NOTE — Progress Notes (Signed)
[facility] OFFICE PROGRESS NOTE  Patient Care Team: Marletta Lor, NP as PCP - General (Nurse Practitioner)  HISTORY OF PRESENTING ILLNESS: Discussed the use of AI scribe software for clinical note transcription with the patient, who gave verbal consent to proceed.  History of Present Illness   The patient, with recurrent cervical cancer on treatment, presented with a recent episode of diarrhea followed by constipation. The patient reported having a hard, large bowel movement after four days of constipation, which was preceded by diarrhea. The patient had taken an unspecified medication to stop the diarrhea, and subsequently took a laxative to alleviate the constipation.  Following the bowel movement, the patient experienced a burning sensation during urination. The frequency of urination was reported as normal, with nocturia every two hours. The patient denied any changes in the color or smell of the urine, and no hematuria was noted.  The patient also reported a recent decrease in appetite and subsequent weight loss. She mentioned feeling fatigued after short distances of walking. The patient had been on an unspecified antibiotic, Macrobid, in the past. The patient also reported a previous episode of discomfort after consuming a protein bar with chocolate.         Assessment and Plan    Constipation/Diarrhea Recent history of diarrhea followed by constipation and hard stool. No nausea or abnormal eating habits reported. Blood count normal. -Recommend Colace for stool softening. -Encourage hydration of at least 80-90 ounces of liquid per day.  Urinary Discomfort Reports of burning sensation during urination following hard stool. No increased frequency, cloudiness, or abnormal smell reported. No fever, chills, or abnormal vaginal discharge. Previous use of Macrobid. -Collect urinalysis and await results. -If urinalysis abnormal, consider prescribing different  antibiotic. -Continue monitoring symptoms and follow up next week.  Cervical cancer -Continue weight loss efforts for spine health. -Infusion scheduled for next Tuesday. -Will call patient with urinalysis results.        Addendum: UA is not normal, will prescribe a course of Augmentin  No orders of the defined types were placed in this encounter.   All questions were answered. The patient knows to call the clinic with any problems, questions or concerns. The total time spent in the appointment was 20 minutes encounter with patients including review of chart and various tests results, discussions about plan of care and coordination of care plan   Artis Delay, MD 06/26/2023 12:24 PM  REVIEW OF SYSTEMS:  All other systems were reviewed with the patient and are negative.  I have reviewed the past medical history, past surgical history, social history and family history with the patient and they are unchanged from previous note.  ALLERGIES:  is allergic to lisinopril.  MEDICATIONS:  Current Outpatient Medications  Medication Sig Dispense Refill   amoxicillin-clavulanate (AUGMENTIN) 875-125 MG tablet Take 1 tablet by mouth 2 (two) times daily. 14 tablet 0   amLODipine (NORVASC) 10 MG tablet Take 1 tablet (10 mg total) by mouth daily. 30 tablet 6   atenolol (TENORMIN) 100 MG tablet Take 1 tablet (100 mg total) by mouth daily. Take in the morning 90 tablet 1   calcium carbonate (TUMS - DOSED IN MG ELEMENTAL CALCIUM) 500 MG chewable tablet Chew 1 tablet by mouth daily.     cholecalciferol (VITAMIN D3) 25 MCG (1000 UNIT) tablet Take 2,000 Units by mouth daily.     diphenhydrAMINE (BENADRYL ALLERGY) 25 MG tablet Take 2 tablets (50 mg total) by mouth every 6 (six) hours as needed  for allergies. 30 tablet 0   ezetimibe (ZETIA) 10 MG tablet Take 10 mg by mouth in the morning.     famotidine (PEPCID) 20 MG tablet Take 1 tablet (20 mg total) by mouth daily for 5 days. 5 tablet 0   glimepiride  (AMARYL) 1 MG tablet Take 1 mg by mouth daily with breakfast.     lidocaine-prilocaine (EMLA) cream Apply 1 Application topically as needed (for port access). 30 g 3   losartan (COZAAR) 100 MG tablet Take 1 tablet (100 mg total) by mouth every evening. 60 tablet 2   losartan (COZAAR) 50 MG tablet TAKE 1 TABLET(50 MG) BY MOUTH EVERY EVENING 30 tablet 0   magnesium oxide (MAG-OX) 400 (240 Mg) MG tablet Take 1 tablet (400 mg total) by mouth daily. 30 tablet 3   ondansetron (ZOFRAN) 8 MG tablet Take 1 tablet (8 mg total) by mouth every 8 (eight) hours as needed for nausea or vomiting. Start on the third day after chemotherapy. 30 tablet 1   prochlorperazine (COMPAZINE) 10 MG tablet Take 1 tablet (10 mg total) by mouth every 6 (six) hours as needed for nausea or vomiting. 30 tablet 1   rosuvastatin (CRESTOR) 40 MG tablet Take 40 mg by mouth at bedtime.     sitaGLIPtin (JANUVIA) 100 MG tablet Take 50 mg by mouth in the morning.     TYLENOL 500 MG tablet Take 500-1,000 mg by mouth every 6 (six) hours as needed for mild pain or headache.     vitamin B-12 (CYANOCOBALAMIN) 1000 MCG tablet Take 1,000 mcg by mouth in the morning.     No current facility-administered medications for this visit.    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   08/27/2020 Imaging   MRI pelvis Large cervical mass with pelvic adenopathy, at least T2B N1 disease. Question of involvement of posterior urinary bladder (T4) for which additional images have been requested. Patient will return for additional images to answer this question. (Thinner section axial T2 without fat sat and sagittal T2 weighted imaging also without fat saturation.)   Small field-of-view images  show that the parametrial extension comes in very close proximity to the RIGHT ureter.   08/30/2020 Initial Diagnosis   Cervical cancer (HCC)   08/30/2020 Cancer Staging   Staging form: Cervix Uteri, AJCC Version 9 - Clinical stage from 08/30/2020: FIGO Stage IVB (rcT2, cN1, pM1) - Signed by Artis Delay, MD on 09/01/2022 Stage prefix: Recurrence   09/01/2020 PET scan   1. Large cervical mass extending into the endometrial canal to the level of the uterine fundus, associated with pelvic adenopathy to the level of the RIGHT common iliac chain. Please refer to MRI for further detail. 2. No signs of solid organ uptake or metastatic disease to the chest. 3. Subcutaneous nodule with marked increased metabolic activity, correlation with direct clinical inspection is suggested to exclude cutaneous neoplasm or subcutaneous nodule in this location. Complicated sebaceous cyst could also potentially have this appearance. 4. Uptake in the anal canal could likely physiologic. Given the intense nature of the uptake would suggest correlation with direct clinical inspection/examination. 5. Uptake in axillary lymph nodes with activity less than mediastinal blood pool likely small reactive lymph nodes, attention on follow-up. Morphologic features also with benign appearance.  09/16/2020 Procedure   Successful placement of a power injectable Port-A-Cath via the right internal jugular vein. The catheter is ready for immediate use.     09/20/2020 - 10/25/2020 Chemotherapy   She received cisplatin chemo with concurrent radiation       09/22/2020 - 12/08/2020 Radiation Therapy   Radiation Treatment Dates: 09/22/2020 through 12/08/2020   Site: Pelvis Technique: IMRT Total Dose (Gy): 45/45 Dose per Fx (Gy): 1.8 Completed Fx: 25/25 Beam Energies: 6x   Site: pelvic boost Technique: IMRT Total Dose (Gy): 7.2/7.2 Dose per Fx (Gy): 1.8 Completed Fx: 4/4 Beam Energies: 6x   Site: Cervix boost Technique:  HDR-brachytherapy Total Dose (Gy): 27.5/27.5 Dose per Fx (Gy): 5.5 Completed Fx: 5/5 Beam Energies: Ir-192     03/17/2021 PET scan   1. Mark positive response to therapy. 2. Resolution of metabolic activity within the uterine body. 3. Resolution of size and metabolic activity of pelvic lymph nodes. 4. No evidence of new disease outside of the pelvis.   08/10/2022 PET scan   1. New hypermetabolic bilateral pulmonary nodules and hypermetabolic cervical and hilar lymph nodes, consistent with metastatic disease. 2. Hypermetabolic focus in the anterior right third costochondral junction without discrete CT correlate is also compatible with metastatic disease. 3. Prior hysterectomy without suspicious hypermetabolic nodularity along the vaginal cuff. 4.  Aortic Atherosclerosis (ICD10-I70.0).   08/24/2022 Pathology Results   A. SOFT TISSUE NODULE, RIGHT CHEST WALL, NEEDLE CORE BIOPSY:  Poorly differentiated carcinoma.  See comment.   COMMENT:  The carcinoma is characterized by nest of malignant epithelioid cells which focally has features suggestive of squamous differentiation morphologically.  Immunohistochemistry is positive with cytokeratin 7, PAX8, MOC-31 and estrogen receptor.  There is patchy positivity with p16, progesterone receptor and TTF-1.  The tumor is negative with p40, cytokeratin 5/6, p63, CDX2, cytokeratin 20 and WT-1.  The immunophenotype is consistent with adenocarcinoma and suggestive of a gynecologic primary.    09/08/2022 -  Chemotherapy   Patient is on Treatment Plan : OVARIAN Carboplatin + Paclitaxel + Bevacizumab q21d      11/07/2022 Imaging   1. Decreased size of the pulmonary nodules and mediastinal adenopathy, consistent with treatment response. 2. Similar appearance of the previously hypermetabolic soft tissue subjacent to the right anterior third costochondral cartilage. 3. No convincing evidence of new or progressive disease in the chest, abdomen or pelvis. 4.  Rectal wall thickening with stranding in the mesorectal fat and diffuse thickening of the urinary bladder wall, nonspecific but possibly reflecting post radiation change. 5. Nonobstructive right renal stones measure up to 6 mm in the renal pelvis. 6. Colonic diverticulosis without findings of acute diverticulitis. 7.  Aortic Atherosclerosis (ICD10-I70.0).   02/26/2023 Imaging   CT CHEST ABDOMEN PELVIS W CONTRAST  Result Date: 02/24/2023 CLINICAL DATA:  Cervical cancer, assess treatment response * Tracking Code: BO * EXAM: CT CHEST, ABDOMEN, AND PELVIS WITH CONTRAST TECHNIQUE: Multidetector CT imaging of the chest, abdomen and pelvis was performed following the standard protocol during bolus administration of intravenous contrast. RADIATION DOSE REDUCTION: This exam was performed according to the departmental dose-optimization program which includes automated exposure control, adjustment of the mA and/or kV according to patient size and/or use of iterative reconstruction technique. CONTRAST:  OMNIPAQUE IOHEXOL 300 MG/ML  SOLN COMPARISON:  CT chest abdomen pelvis, 11/07/2022, PET-CT, 08/10/2022 FINDINGS: CT CHEST FINDINGS Cardiovascular: Right chest port catheter. Normal heart size. No pericardial effusion. Mediastinum/Nodes: No persistently enlarged mediastinal, hilar, or axillary lymph nodes. Benign calcified left hilar lymph  nodes. Small hiatal hernia. Thyroid gland, trachea, and esophagus demonstrate no significant findings. Lungs/Pleura: Near complete resolution of previously seen bilateral pulmonary nodules, only tiny treated residual remaining, for example a 0.5 cm irregular nodule of the peripheral right apex (series 4, image 28) and a linear residua in the medial left upper lobe (series 4, image 42) benign calcified nodule of the anterior left upper lobe, for which no further follow-up or characterization is required (series 4, image 46). Scarring and volume loss of the left lung base (series 4,  image 105). No pleural effusion or pneumothorax. Musculoskeletal: No chest wall abnormality. Previously FDG avid soft tissue nodule in the anterior right third chondral cartilage is not well appreciated by today's CT (series 2, image 24). No acute osseous findings. CT ABDOMEN PELVIS FINDINGS Hepatobiliary: No solid liver abnormality is seen. No gallstones, gallbladder wall thickening, or biliary dilatation. Pancreas: Unremarkable. No pancreatic ductal dilatation or surrounding inflammatory changes. Spleen: Normal in size. Punctuate granulomatous calcifications of the spleen. Adrenals/Urinary Tract: Adrenal glands are unremarkable. Nonobstructive calculi of the superior pole of the right kidney as well as the right renal pelvis, renal pelvic calculus measuring 0.8 cm (series 2, image 66). The left kidney is normal, without renal calculi, solid lesion, or hydronephrosis. Unchanged diffuse bladder wall thickening with adjacent fat stranding (series 6, image 106) Stomach/Bowel: Stomach is within normal limits. Appendix is not clearly visualized and may be surgically absent. No evidence of bowel wall thickening, distention, or inflammatory changes. Moderate burden of stool throughout the colon and rectum. Sigmoid diverticulosis. Unchanged long segment wall thickening involving the distal sigmoid colon and rectum, particularly with circumferential wall thickening and perirectal fat spiculation of the rectum (series 2, image 101) Vascular/Lymphatic: Aortic atherosclerosis. No enlarged abdominal or pelvic lymph nodes. Reproductive: No directly visualized cervical mass. Other: No abdominal wall hernia or abnormality. No ascites. Musculoskeletal: No acute osseous findings. Unchanged sclerotic inferior endplate deformity of L5 (series 6, image 100). IMPRESSION: 1. No directly visualized cervical mass. 2. Near complete resolution of previously seen pulmonary nodules, with only small residua, consistent with continued treatment  response of pulmonary metastatic disease. 3. No persistently enlarged mediastinal lymph nodes, consistent with sustained treatment response of nodal metastatic disease. 4. No appreciable CT correlate of a previously seen FDG avid soft tissue nodule within the anterior right third costochondral cartilage. 5. Unchanged long segment wall thickening involving the distal sigmoid colon and rectum, particularly with circumferential wall thickening and perirectal fat spiculation of the rectum, as well as diffuse bladder wall thickening. 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 Attention on follow-up. 6. Nonobstructive right nephrolithiasis. Aortic Atherosclerosis (ICD10-I70.0). Electronically Signed   By: Jearld Lesch M.D.   On: 02/24/2023 16:31      05/17/2023 Imaging   CT CHEST ABDOMEN PELVIS W CONTRAST  Result Date: 05/20/2023 CLINICAL DATA:  History of cervical cancer, assess treatment response. * Tracking Code: BO * EXAM: CT CHEST, ABDOMEN, AND PELVIS WITH CONTRAST TECHNIQUE: Multidetector CT imaging of the chest, abdomen and pelvis was performed following the standard protocol during bolus administration of intravenous contrast. RADIATION DOSE REDUCTION: This exam was performed according to the departmental dose-optimization program which includes automated exposure control, adjustment of the mA and/or kV according to patient size and/or use of iterative reconstruction technique. CONTRAST:  OMNIPAQUE IOHEXOL 300 MG/ML  SOLN COMPARISON:  Multiple priors including CT February 23, 2023. FINDINGS: CT CHEST FINDINGS Cardiovascular: Accessed right chest Port-A-Cath with tip at the superior cavoatrial junction no  central pulmonary embolus on this nondedicated study. Normal size heart. No significant pericardial effusion/thickening. Mediastinum/Nodes: No suspicious thyroid nodule. No pathologically enlarged mediastinal, hilar or axillary lymph nodes small hiatal hernia Lungs/Pleura:  Stable pulmonary nodules. For reference the previously indexed right upper lobe pulmonary nodule measures 4 mm on image 36/6, unchanged. Cluster of new tiny nodules in the right lower lobe measure up to 3 mm on image 85/6. Scattered atelectasis/scarring. Left upper lobe calcified granuloma. No pleural effusion. No pneumothorax Musculoskeletal: No aggressive lytic or blastic lesion of bone. Multilevel degenerative changes spine. CT ABDOMEN PELVIS FINDINGS Hepatobiliary: No suspicious hepatic lesion. Gallbladder is unremarkable. No biliary ductal dilation. Pancreas: No pancreatic ductal dilation or evidence of acute inflammation. Spleen: Calcified splenic granulomata Adrenals/Urinary Tract: Bilateral adrenal glands appear normal. Nonobstructive bilateral renal calculi including a 7 mm calculus in the right renal pelvis. Similar perivesicular stranding besides a nondistended urinary bladder Stomach/Bowel: Stomach is unremarkable for degree of distension. No pathologic dilation of small or large bowel. Colonic diverticulosis. Similar mild wall thickening of the distal sigmoid colon/rectum with adjacent fat stranding. Vascular/Lymphatic: Aortic atherosclerosis. Smooth IVC contours. The portal, splenic and superior mesenteric veins are patent. No pathologically enlarged abdominal or pelvic lymph nodes. Reproductive: No discrete cervical mass. No suspicious adnexal lesion. Other: No significant abdominopelvic free fluid. No discrete peritoneal or omental nodularity Musculoskeletal: No aggressive lytic or blastic lesion of bone. Similar sclerotic endplate deformity at L5. Probable postradiation change in the sacrum. Multilevel degenerative change of the spine. IMPRESSION: 1. Previously indexed and non indexed pulmonary nodules are stable. Cluster of new tiny nodules in the right lower lobe measure up to 3 mm, favored infectious/inflammatory. Suggest attention on short-term interval follow-up chest CT. 2. No convincing  evidence of new or progressive metastatic disease in the chest, abdomen or pelvis. 3. Similar mild perivesicular stranding and mild wall thickening of the distal sigmoid colon/rectum with adjacent fat stranding, most consistent with postradiation proctitis/cystitis. 4. Nonobstructive bilateral renal calculi including a 7 mm calculus in the right renal pelvis. 5.  Aortic Atherosclerosis (ICD10-I70.0). Electronically Signed   By: Maudry Mayhew M.D.   On: 05/20/2023 15:13      Malignant neoplasm of cervix (HCC)  09/01/2020 Initial Diagnosis   Malignant neoplasm of cervix (HCC)   09/20/2020 - 10/25/2020 Chemotherapy   She received cisplatin chemo with concurrent radiation       09/22/2020 - 12/08/2020 Radiation Therapy   Radiation Treatment Dates: 09/22/2020 through 12/08/2020   Site: Pelvis Technique: IMRT Total Dose (Gy): 45/45 Dose per Fx (Gy): 1.8 Completed Fx: 25/25 Beam Energies: 6x   Site: pelvic boost Technique: IMRT Total Dose (Gy): 7.2/7.2 Dose per Fx (Gy): 1.8 Completed Fx: 4/4 Beam Energies: 6x   Site: Cervix boost Technique: HDR-brachytherapy Total Dose (Gy): 27.5/27.5 Dose per Fx (Gy): 5.5 Completed Fx: 5/5 Beam Energies: Ir-192   09/08/2022 -  Chemotherapy   Patient is on Treatment Plan : OVARIAN Carboplatin + Paclitaxel + Bevacizumab q21d        PHYSICAL EXAMINATION: ECOG PERFORMANCE STATUS: 1 - Symptomatic but completely ambulatory  Vitals:   06/26/23 1158  BP: (!) 118/51  Pulse: (!) 58  Resp: 18  Temp: 98 F (36.7 C)  SpO2: 98%   Filed Weights   06/26/23 1158  Weight: 197 lb 12.8 oz (89.7 kg)    GENERAL:alert, no distress and comfortable  LABORATORY DATA:  I have reviewed the data as listed    Component Value Date/Time   NA 136 06/26/2023 1132  K 3.8 06/26/2023 1132   CL 101 06/26/2023 1132   CO2 28 06/26/2023 1132   GLUCOSE 149 (H) 06/26/2023 1132   BUN 12 06/26/2023 1132   CREATININE 1.23 (H) 06/26/2023 1132   CALCIUM 9.3 06/26/2023  1132   PROT 7.0 06/26/2023 1132   ALBUMIN 3.7 06/26/2023 1132   AST 19 06/26/2023 1132   ALT 9 06/26/2023 1132   ALKPHOS 50 06/26/2023 1132   BILITOT 1.1 06/26/2023 1132   GFRNONAA 47 (L) 06/26/2023 1132    No results found for: "SPEP", "UPEP"  Lab Results  Component Value Date   WBC 5.6 06/26/2023   NEUTROABS 3.4 06/26/2023   HGB 10.9 (L) 06/26/2023   HCT 32.3 (L) 06/26/2023   MCV 97.3 06/26/2023   PLT 202 06/26/2023      Chemistry      Component Value Date/Time   NA 136 06/26/2023 1132   K 3.8 06/26/2023 1132   CL 101 06/26/2023 1132   CO2 28 06/26/2023 1132   BUN 12 06/26/2023 1132   CREATININE 1.23 (H) 06/26/2023 1132      Component Value Date/Time   CALCIUM 9.3 06/26/2023 1132   ALKPHOS 50 06/26/2023 1132   AST 19 06/26/2023 1132   ALT 9 06/26/2023 1132   BILITOT 1.1 06/26/2023 1132

## 2023-06-27 ENCOUNTER — Telehealth: Payer: Self-pay | Admitting: Hematology and Oncology

## 2023-06-27 LAB — URINE CULTURE: Culture: NO GROWTH

## 2023-06-27 NOTE — Telephone Encounter (Signed)
TC to Pt relayed message about negative urine culture. And to discontinue taking antibiotics.

## 2023-06-27 NOTE — Telephone Encounter (Signed)
-----   Message from Artis Delay sent at 06/27/2023  2:17 PM EDT ----- Pls call her  Urine culture is neg She can stop antibiotics

## 2023-07-03 ENCOUNTER — Inpatient Hospital Stay: Payer: Medicare PPO

## 2023-07-03 ENCOUNTER — Inpatient Hospital Stay: Payer: Medicare PPO | Admitting: Hematology and Oncology

## 2023-07-03 ENCOUNTER — Encounter: Payer: Self-pay | Admitting: Hematology and Oncology

## 2023-07-03 VITALS — BP 113/50 | HR 67 | Resp 18

## 2023-07-03 VITALS — BP 107/51 | HR 71 | Temp 98.4°F | Resp 18 | Ht 62.0 in | Wt 199.6 lb

## 2023-07-03 DIAGNOSIS — I129 Hypertensive chronic kidney disease with stage 1 through stage 4 chronic kidney disease, or unspecified chronic kidney disease: Secondary | ICD-10-CM | POA: Diagnosis present

## 2023-07-03 DIAGNOSIS — N952 Postmenopausal atrophic vaginitis: Secondary | ICD-10-CM

## 2023-07-03 DIAGNOSIS — R300 Dysuria: Secondary | ICD-10-CM | POA: Diagnosis present

## 2023-07-03 DIAGNOSIS — N10 Acute tubulo-interstitial nephritis: Secondary | ICD-10-CM | POA: Diagnosis present

## 2023-07-03 DIAGNOSIS — R000 Tachycardia, unspecified: Secondary | ICD-10-CM | POA: Diagnosis not present

## 2023-07-03 DIAGNOSIS — Z888 Allergy status to other drugs, medicaments and biological substances status: Secondary | ICD-10-CM | POA: Diagnosis not present

## 2023-07-03 DIAGNOSIS — A4150 Gram-negative sepsis, unspecified: Secondary | ICD-10-CM | POA: Diagnosis present

## 2023-07-03 DIAGNOSIS — C539 Malignant neoplasm of cervix uteri, unspecified: Secondary | ICD-10-CM

## 2023-07-03 DIAGNOSIS — Z923 Personal history of irradiation: Secondary | ICD-10-CM | POA: Diagnosis not present

## 2023-07-03 DIAGNOSIS — C7800 Secondary malignant neoplasm of unspecified lung: Secondary | ICD-10-CM | POA: Diagnosis not present

## 2023-07-03 DIAGNOSIS — Y842 Radiological procedure and radiotherapy as the cause of abnormal reaction of the patient, or of later complication, without mention of misadventure at the time of the procedure: Secondary | ICD-10-CM | POA: Diagnosis present

## 2023-07-03 DIAGNOSIS — R6520 Severe sepsis without septic shock: Secondary | ICD-10-CM | POA: Diagnosis present

## 2023-07-03 DIAGNOSIS — Z1152 Encounter for screening for COVID-19: Secondary | ICD-10-CM | POA: Diagnosis not present

## 2023-07-03 DIAGNOSIS — K5900 Constipation, unspecified: Secondary | ICD-10-CM | POA: Diagnosis present

## 2023-07-03 DIAGNOSIS — E1122 Type 2 diabetes mellitus with diabetic chronic kidney disease: Secondary | ICD-10-CM | POA: Diagnosis present

## 2023-07-03 DIAGNOSIS — Z7984 Long term (current) use of oral hypoglycemic drugs: Secondary | ICD-10-CM | POA: Diagnosis not present

## 2023-07-03 DIAGNOSIS — N12 Tubulo-interstitial nephritis, not specified as acute or chronic: Secondary | ICD-10-CM | POA: Diagnosis not present

## 2023-07-03 DIAGNOSIS — Z0389 Encounter for observation for other suspected diseases and conditions ruled out: Secondary | ICD-10-CM | POA: Diagnosis not present

## 2023-07-03 DIAGNOSIS — G8929 Other chronic pain: Secondary | ICD-10-CM | POA: Diagnosis not present

## 2023-07-03 DIAGNOSIS — D509 Iron deficiency anemia, unspecified: Secondary | ICD-10-CM | POA: Diagnosis present

## 2023-07-03 DIAGNOSIS — A419 Sepsis, unspecified organism: Secondary | ICD-10-CM | POA: Diagnosis not present

## 2023-07-03 DIAGNOSIS — I10 Essential (primary) hypertension: Secondary | ICD-10-CM

## 2023-07-03 DIAGNOSIS — E7800 Pure hypercholesterolemia, unspecified: Secondary | ICD-10-CM | POA: Diagnosis present

## 2023-07-03 DIAGNOSIS — N390 Urinary tract infection, site not specified: Secondary | ICD-10-CM | POA: Diagnosis present

## 2023-07-03 DIAGNOSIS — E872 Acidosis, unspecified: Secondary | ICD-10-CM | POA: Diagnosis present

## 2023-07-03 DIAGNOSIS — R109 Unspecified abdominal pain: Secondary | ICD-10-CM | POA: Diagnosis not present

## 2023-07-03 DIAGNOSIS — K828 Other specified diseases of gallbladder: Secondary | ICD-10-CM | POA: Diagnosis not present

## 2023-07-03 DIAGNOSIS — E876 Hypokalemia: Secondary | ICD-10-CM | POA: Diagnosis present

## 2023-07-03 DIAGNOSIS — Z833 Family history of diabetes mellitus: Secondary | ICD-10-CM | POA: Diagnosis not present

## 2023-07-03 DIAGNOSIS — C538 Malignant neoplasm of overlapping sites of cervix uteri: Secondary | ICD-10-CM | POA: Diagnosis not present

## 2023-07-03 DIAGNOSIS — K529 Noninfective gastroenteritis and colitis, unspecified: Secondary | ICD-10-CM | POA: Diagnosis present

## 2023-07-03 DIAGNOSIS — K627 Radiation proctitis: Secondary | ICD-10-CM | POA: Diagnosis present

## 2023-07-03 DIAGNOSIS — B9620 Unspecified Escherichia coli [E. coli] as the cause of diseases classified elsewhere: Secondary | ICD-10-CM | POA: Diagnosis present

## 2023-07-03 DIAGNOSIS — Z8541 Personal history of malignant neoplasm of cervix uteri: Secondary | ICD-10-CM | POA: Diagnosis not present

## 2023-07-03 DIAGNOSIS — M545 Low back pain, unspecified: Secondary | ICD-10-CM

## 2023-07-03 DIAGNOSIS — Z808 Family history of malignant neoplasm of other organs or systems: Secondary | ICD-10-CM | POA: Diagnosis not present

## 2023-07-03 DIAGNOSIS — R9389 Abnormal findings on diagnostic imaging of other specified body structures: Secondary | ICD-10-CM | POA: Diagnosis not present

## 2023-07-03 DIAGNOSIS — D519 Vitamin B12 deficiency anemia, unspecified: Secondary | ICD-10-CM | POA: Diagnosis present

## 2023-07-03 DIAGNOSIS — N1832 Chronic kidney disease, stage 3b: Secondary | ICD-10-CM | POA: Diagnosis present

## 2023-07-03 LAB — CBC WITH DIFFERENTIAL (CANCER CENTER ONLY)
Abs Immature Granulocytes: 0.02 10*3/uL (ref 0.00–0.07)
Basophils Absolute: 0 10*3/uL (ref 0.0–0.1)
Basophils Relative: 0 %
Eosinophils Absolute: 0.3 10*3/uL (ref 0.0–0.5)
Eosinophils Relative: 6 %
HCT: 30.8 % — ABNORMAL LOW (ref 36.0–46.0)
Hemoglobin: 10.4 g/dL — ABNORMAL LOW (ref 12.0–15.0)
Immature Granulocytes: 0 %
Lymphocytes Relative: 21 %
Lymphs Abs: 1 10*3/uL (ref 0.7–4.0)
MCH: 33.2 pg (ref 26.0–34.0)
MCHC: 33.8 g/dL (ref 30.0–36.0)
MCV: 98.4 fL (ref 80.0–100.0)
Monocytes Absolute: 0.5 10*3/uL (ref 0.1–1.0)
Monocytes Relative: 10 %
Neutro Abs: 2.9 10*3/uL (ref 1.7–7.7)
Neutrophils Relative %: 63 %
Platelet Count: 179 10*3/uL (ref 150–400)
RBC: 3.13 MIL/uL — ABNORMAL LOW (ref 3.87–5.11)
RDW: 15.8 % — ABNORMAL HIGH (ref 11.5–15.5)
WBC Count: 4.7 10*3/uL (ref 4.0–10.5)
nRBC: 0 % (ref 0.0–0.2)

## 2023-07-03 LAB — CMP (CANCER CENTER ONLY)
ALT: 9 U/L (ref 0–44)
AST: 18 U/L (ref 15–41)
Albumin: 3.5 g/dL (ref 3.5–5.0)
Alkaline Phosphatase: 46 U/L (ref 38–126)
Anion gap: 7 (ref 5–15)
BUN: 11 mg/dL (ref 8–23)
CO2: 26 mmol/L (ref 22–32)
Calcium: 9.3 mg/dL (ref 8.9–10.3)
Chloride: 103 mmol/L (ref 98–111)
Creatinine: 1.18 mg/dL — ABNORMAL HIGH (ref 0.44–1.00)
GFR, Estimated: 49 mL/min — ABNORMAL LOW (ref >60)
Glucose, Bld: 143 mg/dL — ABNORMAL HIGH (ref 70–99)
Potassium: 3.3 mmol/L — ABNORMAL LOW (ref 3.5–5.1)
Sodium: 136 mmol/L (ref 135–145)
Total Bilirubin: 0.8 mg/dL (ref 0.3–1.2)
Total Protein: 6.6 g/dL (ref 6.5–8.1)

## 2023-07-03 LAB — TSH: TSH: 5.44 u[IU]/mL — ABNORMAL HIGH (ref 0.350–4.500)

## 2023-07-03 LAB — TOTAL PROTEIN, URINE DIPSTICK: Protein, ur: 300 mg/dL — AB

## 2023-07-03 MED ORDER — SODIUM CHLORIDE 0.9% FLUSH
10.0000 mL | Freq: Once | INTRAVENOUS | Status: AC
Start: 2023-07-03 — End: 2023-07-03
  Administered 2023-07-03: 10 mL

## 2023-07-03 MED ORDER — SODIUM CHLORIDE 0.9 % IV SOLN
200.0000 mg | Freq: Once | INTRAVENOUS | Status: AC
  Administered 2023-07-03: 200 mg via INTRAVENOUS
  Filled 2023-07-03: qty 200

## 2023-07-03 MED ORDER — HEPARIN SOD (PORK) LOCK FLUSH 100 UNIT/ML IV SOLN
500.0000 [IU] | Freq: Once | INTRAVENOUS | Status: AC | PRN
  Administered 2023-07-03: 500 [IU]

## 2023-07-03 MED ORDER — SODIUM CHLORIDE 0.9% FLUSH
10.0000 mL | INTRAVENOUS | Status: DC | PRN
  Administered 2023-07-03: 10 mL

## 2023-07-03 MED ORDER — SODIUM CHLORIDE 0.9 % IV SOLN: Freq: Once | INTRAVENOUS | Status: AC

## 2023-07-03 MED ORDER — TRAMADOL HCL 50 MG PO TABS: 100.0000 mg | ORAL_TABLET | Freq: Four times a day (QID) | ORAL | 1 refills | Status: DC | PRN

## 2023-07-03 NOTE — Assessment & Plan Note (Signed)
Her recent symptom was not due to UTI We will proceed with chemotherapy today without bevacizumab due to heavy proteinuria Her blood pressure is better controlled I will see her again in 3 weeks

## 2023-07-03 NOTE — Assessment & Plan Note (Signed)
She has recent sensation of dysuria with urination Urine culture was negative Suspect she has atrophic vaginitis I would have prescribed topical estrogen cream; however, her tumor expressed estrogen receptor positivity Ultimately, the patient is in agreement to try pain medicine as needed along with topical lubricants daily

## 2023-07-03 NOTE — Progress Notes (Signed)
[facility] OFFICE PROGRESS NOTE  Patient Care Team: Marletta Lor, NP as PCP - General (Nurse Practitioner)  ASSESSMENT & PLAN:  Cervical cancer [facility]) Her recent symptom was not due to UTI We will proceed with chemotherapy today without bevacizumab due to heavy proteinuria Her blood pressure is better controlled I will see her again in 3 weeks  Atrophic vaginitis She has recent sensation of dysuria with urination Urine culture was negative Suspect she has atrophic vaginitis I would have prescribed topical estrogen cream; however, her tumor expressed estrogen receptor positivity Ultimately, the patient is in agreement to try pain medicine as needed along with topical lubricants daily  Essential hypertension Your blood pressure is not better controlled despite proteinuria We will skip today's bevacizumab dose  Low back ache She has intermittent chronic back pain She desired prescription of steroids but I recommend against that due to interaction with immunotherapy I refilled her prescription of tramadol and advised her to take 1 to 2 tablets as needed along with acetaminophen  No orders of the defined types were placed in this encounter.   All questions were answered. The patient knows to call the clinic with any problems, questions or concerns. The total time spent in the appointment was 30 minutes encounter with patients including review of chart and various tests results, discussions about plan of care and coordination of care plan   Artis Delay, MD 07/03/2023 12:05 PM  INTERVAL HISTORY: Please see below for problem oriented charting. she returns for treatment today Since last time I saw her, I prescribed antibiotics but subsequent urine culture comes back negative and antibiotic was discontinued She felt about the same with persistent sensation of urinary burning sensation when she urinates No fever or chills We discussed test results She has intermittent  low back pain stable on acetaminophen and tramadol  REVIEW OF SYSTEMS:   Constitutional: Denies fevers, chills or abnormal weight loss Eyes: Denies blurriness of vision Ears, nose, mouth, throat, and face: Denies mucositis or sore throat Respiratory: Denies cough, dyspnea or wheezes Cardiovascular: Denies palpitation, chest discomfort or lower extremity swelling Gastrointestinal:  Denies nausea, heartburn or change in bowel habits Skin: Denies abnormal skin rashes Lymphatics: Denies new lymphadenopathy or easy bruising Neurological:Denies numbness, tingling or new weaknesses Behavioral/Psych: Mood is stable, no new changes  All other systems were reviewed with the patient and are negative.  I have reviewed the past medical history, past surgical history, social history and family history with the patient and they are unchanged from previous note.  ALLERGIES:  is allergic to lisinopril.  MEDICATIONS:  Current Outpatient Medications  Medication Sig Dispense Refill   traMADol (ULTRAM) 50 MG tablet Take 2 tablets (100 mg total) by mouth every 6 (six) hours as needed. 90 tablet 1   amLODipine (NORVASC) 10 MG tablet Take 1 tablet (10 mg total) by mouth daily. 30 tablet 6   atenolol (TENORMIN) 100 MG tablet Take 1 tablet (100 mg total) by mouth daily. Take in the morning 90 tablet 1   calcium carbonate (TUMS - DOSED IN MG ELEMENTAL CALCIUM) 500 MG chewable tablet Chew 1 tablet by mouth daily.     cholecalciferol (VITAMIN D3) 25 MCG (1000 UNIT) tablet Take 2,000 Units by mouth daily.     diphenhydrAMINE (BENADRYL ALLERGY) 25 MG tablet Take 2 tablets (50 mg total) by mouth every 6 (six) hours as needed for allergies. 30 tablet 0   ezetimibe (ZETIA) 10 MG tablet Take 10 mg by mouth in the  morning.     famotidine (PEPCID) 20 MG tablet Take 1 tablet (20 mg total) by mouth daily for 5 days. 5 tablet 0   glimepiride (AMARYL) 1 MG tablet Take 1 mg by mouth daily with breakfast.      lidocaine-prilocaine (EMLA) cream Apply 1 Application topically as needed (for port access). 30 g 3   losartan (COZAAR) 100 MG tablet Take 1 tablet (100 mg total) by mouth every evening. 60 tablet 2   losartan (COZAAR) 50 MG tablet TAKE 1 TABLET(50 MG) BY MOUTH EVERY EVENING 30 tablet 0   ondansetron (ZOFRAN) 8 MG tablet Take 1 tablet (8 mg total) by mouth every 8 (eight) hours as needed for nausea or vomiting. Start on the third day after chemotherapy. 30 tablet 1   prochlorperazine (COMPAZINE) 10 MG tablet Take 1 tablet (10 mg total) by mouth every 6 (six) hours as needed for nausea or vomiting. 30 tablet 1   rosuvastatin (CRESTOR) 40 MG tablet Take 40 mg by mouth at bedtime.     sitaGLIPtin (JANUVIA) 100 MG tablet Take 50 mg by mouth in the morning.     TYLENOL 500 MG tablet Take 500-1,000 mg by mouth every 6 (six) hours as needed for mild pain or headache.     vitamin B-12 (CYANOCOBALAMIN) 1000 MCG tablet Take 1,000 mcg by mouth in the morning.     No current facility-administered medications for this visit.   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   08/27/2020 Imaging   MRI pelvis Large cervical mass with pelvic adenopathy, at least T2B N1 disease. Question of involvement of posterior urinary  bladder (T4) for which additional images have been requested. Patient will return for additional images to answer this question. (Thinner section axial T2 without fat sat and sagittal T2 weighted imaging also without fat saturation.)   Small field-of-view images show that the parametrial extension comes in very close proximity to the RIGHT ureter.   08/30/2020 Initial Diagnosis   Cervical cancer (HCC)   08/30/2020 Cancer Staging   Staging form: Cervix Uteri, AJCC Version 9 - Clinical stage from 08/30/2020: FIGO Stage IVB (rcT2, cN1, pM1) - Signed by Artis Delay, MD on 09/01/2022 Stage prefix: Recurrence   09/01/2020 PET scan   1. Large cervical mass extending into the endometrial canal to the level of the uterine fundus, associated with pelvic adenopathy to the level of the RIGHT common iliac chain. Please refer to MRI for further detail. 2. No signs of solid organ uptake or metastatic disease to the chest. 3. Subcutaneous nodule with marked increased metabolic activity, correlation with direct clinical inspection is suggested to exclude cutaneous neoplasm or subcutaneous nodule in this location. Complicated sebaceous cyst could also potentially have this appearance. 4. Uptake in  the anal canal could likely physiologic. Given the intense nature of the uptake would suggest correlation with direct clinical inspection/examination. 5. Uptake in axillary lymph nodes with activity less than mediastinal blood pool likely small reactive lymph nodes, attention on follow-up. Morphologic features also with benign appearance.   09/16/2020 Procedure   Successful placement of a power injectable Port-A-Cath via the right internal jugular vein. The catheter is ready for immediate use.     09/20/2020 - 10/25/2020 Chemotherapy   She received cisplatin chemo with concurrent radiation       09/22/2020 - 12/08/2020 Radiation Therapy   Radiation Treatment Dates: 09/22/2020 through 12/08/2020   Site:  Pelvis Technique: IMRT Total Dose (Gy): 45/45 Dose per Fx (Gy): 1.8 Completed Fx: 25/25 Beam Energies: 6x   Site: pelvic boost Technique: IMRT Total Dose (Gy): 7.2/7.2 Dose per Fx (Gy): 1.8 Completed Fx: 4/4 Beam Energies: 6x   Site: Cervix boost Technique: HDR-brachytherapy Total Dose (Gy): 27.5/27.5 Dose per Fx (Gy): 5.5 Completed Fx: 5/5 Beam Energies: Ir-192     03/17/2021 PET scan   1. Mark positive response to therapy. 2. Resolution of metabolic activity within the uterine body. 3. Resolution of size and metabolic activity of pelvic lymph nodes. 4. No evidence of new disease outside of the pelvis.   08/10/2022 PET scan   1. New hypermetabolic bilateral pulmonary nodules and hypermetabolic cervical and hilar lymph nodes, consistent with metastatic disease. 2. Hypermetabolic focus in the anterior right third costochondral junction without discrete CT correlate is also compatible with metastatic disease. 3. Prior hysterectomy without suspicious hypermetabolic nodularity along the vaginal cuff. 4.  Aortic Atherosclerosis (ICD10-I70.0).   08/24/2022 Pathology Results   A. SOFT TISSUE NODULE, RIGHT CHEST WALL, NEEDLE CORE BIOPSY:  Poorly differentiated carcinoma.  See comment.   COMMENT:  The carcinoma is characterized by nest of malignant epithelioid cells which focally has features suggestive of squamous differentiation morphologically.  Immunohistochemistry is positive with cytokeratin 7, PAX8, MOC-31 and estrogen receptor.  There is patchy positivity with p16, progesterone receptor and TTF-1.  The tumor is negative with p40, cytokeratin 5/6, p63, CDX2, cytokeratin 20 and WT-1.  The immunophenotype is consistent with adenocarcinoma and suggestive of a gynecologic primary.    09/08/2022 -  Chemotherapy   Patient is on Treatment Plan : OVARIAN Carboplatin + Paclitaxel + Bevacizumab q21d      11/07/2022 Imaging   1. Decreased size of the pulmonary nodules and mediastinal  adenopathy, consistent with treatment response. 2. Similar appearance of the previously hypermetabolic soft tissue subjacent to the right anterior third costochondral cartilage. 3. No convincing evidence of new or progressive disease in the chest, abdomen or pelvis. 4. Rectal wall thickening with stranding in the mesorectal fat and diffuse thickening of the urinary bladder wall, nonspecific but possibly reflecting post radiation change. 5. Nonobstructive right renal stones measure up to 6 mm in the renal pelvis. 6. Colonic diverticulosis without findings of acute diverticulitis. 7.  Aortic Atherosclerosis (ICD10-I70.0).   02/26/2023 Imaging   CT CHEST ABDOMEN PELVIS W CONTRAST  Result Date: 02/24/2023 CLINICAL DATA:  Cervical cancer, assess treatment response * Tracking Code: BO * EXAM: CT CHEST, ABDOMEN, AND PELVIS WITH CONTRAST TECHNIQUE: Multidetector CT imaging of the chest, abdomen and pelvis was performed following the standard protocol during bolus administration of intravenous contrast. RADIATION DOSE REDUCTION: This exam was performed according to the departmental dose-optimization program which includes automated exposure control, adjustment of the mA and/or kV according to patient size and/or use of iterative reconstruction  technique. CONTRAST:  OMNIPAQUE IOHEXOL 300 MG/ML  SOLN COMPARISON:  CT chest abdomen pelvis, 11/07/2022, PET-CT, 08/10/2022 FINDINGS: CT CHEST FINDINGS Cardiovascular: Right chest port catheter. Normal heart size. No pericardial effusion. Mediastinum/Nodes: No persistently enlarged mediastinal, hilar, or axillary lymph nodes. Benign calcified left hilar lymph nodes. Small hiatal hernia. Thyroid gland, trachea, and esophagus demonstrate no significant findings. Lungs/Pleura: Near complete resolution of previously seen bilateral pulmonary nodules, only tiny treated residual remaining, for example a 0.5 cm irregular nodule of the peripheral right apex (series 4, image 28)  and a linear residua in the medial left upper lobe (series 4, image 42) benign calcified nodule of the anterior left upper lobe, for which no further follow-up or characterization is required (series 4, image 46). Scarring and volume loss of the left lung base (series 4, image 105). No pleural effusion or pneumothorax. Musculoskeletal: No chest wall abnormality. Previously FDG avid soft tissue nodule in the anterior right third chondral cartilage is not well appreciated by today's CT (series 2, image 24). No acute osseous findings. CT ABDOMEN PELVIS FINDINGS Hepatobiliary: No solid liver abnormality is seen. No gallstones, gallbladder wall thickening, or biliary dilatation. Pancreas: Unremarkable. No pancreatic ductal dilatation or surrounding inflammatory changes. Spleen: Normal in size. Punctuate granulomatous calcifications of the spleen. Adrenals/Urinary Tract: Adrenal glands are unremarkable. Nonobstructive calculi of the superior pole of the right kidney as well as the right renal pelvis, renal pelvic calculus measuring 0.8 cm (series 2, image 66). The left kidney is normal, without renal calculi, solid lesion, or hydronephrosis. Unchanged diffuse bladder wall thickening with adjacent fat stranding (series 6, image 106) Stomach/Bowel: Stomach is within normal limits. Appendix is not clearly visualized and may be surgically absent. No evidence of bowel wall thickening, distention, or inflammatory changes. Moderate burden of stool throughout the colon and rectum. Sigmoid diverticulosis. Unchanged long segment wall thickening involving the distal sigmoid colon and rectum, particularly with circumferential wall thickening and perirectal fat spiculation of the rectum (series 2, image 101) Vascular/Lymphatic: Aortic atherosclerosis. No enlarged abdominal or pelvic lymph nodes. Reproductive: No directly visualized cervical mass. Other: No abdominal wall hernia or abnormality. No ascites. Musculoskeletal: No acute  osseous findings. Unchanged sclerotic inferior endplate deformity of L5 (series 6, image 100). IMPRESSION: 1. No directly visualized cervical mass. 2. Near complete resolution of previously seen pulmonary nodules, with only small residua, consistent with continued treatment response of pulmonary metastatic disease. 3. No persistently enlarged mediastinal lymph nodes, consistent with sustained treatment response of nodal metastatic disease. 4. No appreciable CT correlate of a previously seen FDG avid soft tissue nodule within the anterior right third costochondral cartilage. 5. Unchanged long segment wall thickening involving the distal sigmoid colon and rectum, particularly with circumferential wall thickening and perirectal fat spiculation of the rectum, as well as diffuse bladder wall thickening. 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 Attention on follow-up. 6. Nonobstructive right nephrolithiasis. Aortic Atherosclerosis (ICD10-I70.0). Electronically Signed   By: Jearld Lesch M.D.   On: 02/24/2023 16:31      05/17/2023 Imaging   CT CHEST ABDOMEN PELVIS W CONTRAST  Result Date: 05/20/2023 CLINICAL DATA:  History of cervical cancer, assess treatment response. * Tracking Code: BO * EXAM: CT CHEST, ABDOMEN, AND PELVIS WITH CONTRAST TECHNIQUE: Multidetector CT imaging of the chest, abdomen and pelvis was performed following the standard protocol during bolus administration of intravenous contrast. RADIATION DOSE REDUCTION: This exam was performed according to the departmental dose-optimization program which includes automated exposure control, adjustment of  the mA and/or kV according to patient size and/or use of iterative reconstruction technique. CONTRAST:  OMNIPAQUE IOHEXOL 300 MG/ML  SOLN COMPARISON:  Multiple priors including CT February 23, 2023. FINDINGS: CT CHEST FINDINGS Cardiovascular: Accessed right chest Port-A-Cath with tip at the superior cavoatrial junction no  central pulmonary embolus on this nondedicated study. Normal size heart. No significant pericardial effusion/thickening. Mediastinum/Nodes: No suspicious thyroid nodule. No pathologically enlarged mediastinal, hilar or axillary lymph nodes small hiatal hernia Lungs/Pleura: Stable pulmonary nodules. For reference the previously indexed right upper lobe pulmonary nodule measures 4 mm on image 36/6, unchanged. Cluster of new tiny nodules in the right lower lobe measure up to 3 mm on image 85/6. Scattered atelectasis/scarring. Left upper lobe calcified granuloma. No pleural effusion. No pneumothorax Musculoskeletal: No aggressive lytic or blastic lesion of bone. Multilevel degenerative changes spine. CT ABDOMEN PELVIS FINDINGS Hepatobiliary: No suspicious hepatic lesion. Gallbladder is unremarkable. No biliary ductal dilation. Pancreas: No pancreatic ductal dilation or evidence of acute inflammation. Spleen: Calcified splenic granulomata Adrenals/Urinary Tract: Bilateral adrenal glands appear normal. Nonobstructive bilateral renal calculi including a 7 mm calculus in the right renal pelvis. Similar perivesicular stranding besides a nondistended urinary bladder Stomach/Bowel: Stomach is unremarkable for degree of distension. No pathologic dilation of small or large bowel. Colonic diverticulosis. Similar mild wall thickening of the distal sigmoid colon/rectum with adjacent fat stranding. Vascular/Lymphatic: Aortic atherosclerosis. Smooth IVC contours. The portal, splenic and superior mesenteric veins are patent. No pathologically enlarged abdominal or pelvic lymph nodes. Reproductive: No discrete cervical mass. No suspicious adnexal lesion. Other: No significant abdominopelvic free fluid. No discrete peritoneal or omental nodularity Musculoskeletal: No aggressive lytic or blastic lesion of bone. Similar sclerotic endplate deformity at L5. Probable postradiation change in the sacrum. Multilevel degenerative change of the  spine. IMPRESSION: 1. Previously indexed and non indexed pulmonary nodules are stable. Cluster of new tiny nodules in the right lower lobe measure up to 3 mm, favored infectious/inflammatory. Suggest attention on short-term interval follow-up chest CT. 2. No convincing evidence of new or progressive metastatic disease in the chest, abdomen or pelvis. 3. Similar mild perivesicular stranding and mild wall thickening of the distal sigmoid colon/rectum with adjacent fat stranding, most consistent with postradiation proctitis/cystitis. 4. Nonobstructive bilateral renal calculi including a 7 mm calculus in the right renal pelvis. 5.  Aortic Atherosclerosis (ICD10-I70.0). Electronically Signed   By: Maudry Mayhew M.D.   On: 05/20/2023 15:13      Malignant neoplasm of cervix (HCC)  09/01/2020 Initial Diagnosis   Malignant neoplasm of cervix (HCC)   09/20/2020 - 10/25/2020 Chemotherapy   She received cisplatin chemo with concurrent radiation       09/22/2020 - 12/08/2020 Radiation Therapy   Radiation Treatment Dates: 09/22/2020 through 12/08/2020   Site: Pelvis Technique: IMRT Total Dose (Gy): 45/45 Dose per Fx (Gy): 1.8 Completed Fx: 25/25 Beam Energies: 6x   Site: pelvic boost Technique: IMRT Total Dose (Gy): 7.2/7.2 Dose per Fx (Gy): 1.8 Completed Fx: 4/4 Beam Energies: 6x   Site: Cervix boost Technique: HDR-brachytherapy Total Dose (Gy): 27.5/27.5 Dose per Fx (Gy): 5.5 Completed Fx: 5/5 Beam Energies: Ir-192   09/08/2022 -  Chemotherapy   Patient is on Treatment Plan : OVARIAN Carboplatin + Paclitaxel + Bevacizumab q21d        PHYSICAL EXAMINATION: ECOG PERFORMANCE STATUS: 1 - Symptomatic but completely ambulatory  Vitals:   07/03/23 1052  BP: (!) 107/51  Pulse: 71  Resp: 18  Temp: 98.4 F (36.9 C)  SpO2: 100%  Filed Weights   07/03/23 1052  Weight: 199 lb 9.6 oz (90.5 kg)    GENERAL:alert, no distress and comfortable  LABORATORY DATA:  I have reviewed the data as  listed    Component Value Date/Time   NA 136 07/03/2023 1030   K 3.3 (L) 07/03/2023 1030   CL 103 07/03/2023 1030   CO2 26 07/03/2023 1030   GLUCOSE 143 (H) 07/03/2023 1030   BUN 11 07/03/2023 1030   CREATININE 1.18 (H) 07/03/2023 1030   CALCIUM 9.3 07/03/2023 1030   PROT 6.6 07/03/2023 1030   ALBUMIN 3.5 07/03/2023 1030   AST 18 07/03/2023 1030   ALT 9 07/03/2023 1030   ALKPHOS 46 07/03/2023 1030   BILITOT 0.8 07/03/2023 1030   GFRNONAA 49 (L) 07/03/2023 1030    No results found for: "SPEP", "UPEP"  Lab Results  Component Value Date   WBC 4.7 07/03/2023   NEUTROABS 2.9 07/03/2023   HGB 10.4 (L) 07/03/2023   HCT 30.8 (L) 07/03/2023   MCV 98.4 07/03/2023   PLT 179 07/03/2023      Chemistry      Component Value Date/Time   NA 136 07/03/2023 1030   K 3.3 (L) 07/03/2023 1030   CL 103 07/03/2023 1030   CO2 26 07/03/2023 1030   BUN 11 07/03/2023 1030   CREATININE 1.18 (H) 07/03/2023 1030      Component Value Date/Time   CALCIUM 9.3 07/03/2023 1030   ALKPHOS 46 07/03/2023 1030   AST 18 07/03/2023 1030   ALT 9 07/03/2023 1030   BILITOT 0.8 07/03/2023 1030

## 2023-07-03 NOTE — Assessment & Plan Note (Signed)
Your blood pressure is not better controlled despite proteinuria We will skip today's bevacizumab dose

## 2023-07-03 NOTE — Assessment & Plan Note (Signed)
She has intermittent chronic back pain She desired prescription of steroids but I recommend against that due to interaction with immunotherapy I refilled her prescription of tramadol and advised her to take 1 to 2 tablets as needed along with acetaminophen

## 2023-07-03 NOTE — Patient Instructions (Signed)
Misty Deleon  Discharge Instructions: Thank you for choosing [facility] to provide your oncology and hematology care.   If you have a lab appointment with the [facility], please go directly to the [facility] and check in at the registration area.   Wear comfortable clothing and clothing appropriate for easy access to any Portacath or PICC line.   We strive to give you quality time with your provider. You may need to reschedule your appointment if you arrive late (15 or more minutes).  Arriving late affects you and other patients whose appointments are after yours.  Also, if you miss three or more appointments without notifying the office, you may be dismissed from the clinic at the provider's discretion.      For prescription refill requests, have your pharmacy contact our office and allow 72 hours for refills to be completed.    Today you received the following chemotherapy and/or immunotherapy agents: Keytruda      To help prevent nausea and vomiting after your treatment, we encourage you to take your nausea medication as directed.  BELOW ARE SYMPTOMS THAT SHOULD BE REPORTED IMMEDIATELY: *FEVER GREATER THAN 100.4 F (38 C) OR HIGHER *CHILLS OR SWEATING *NAUSEA AND VOMITING THAT IS NOT CONTROLLED WITH YOUR NAUSEA MEDICATION *UNUSUAL SHORTNESS OF BREATH *UNUSUAL BRUISING OR BLEEDING *URINARY PROBLEMS (pain or burning when urinating, or frequent urination) *BOWEL PROBLEMS (unusual diarrhea, constipation, pain near the anus) TENDERNESS IN MOUTH AND THROAT WITH OR WITHOUT PRESENCE OF ULCERS (sore throat, sores in mouth, or a toothache) UNUSUAL RASH, SWELLING OR PAIN  UNUSUAL VAGINAL DISCHARGE OR ITCHING   Items with * indicate a potential emergency and should be followed up as soon as possible or go to the Emergency Department if any problems should occur.  Please show the CHEMOTHERAPY ALERT CARD or IMMUNOTHERAPY ALERT CARD at  check-in to the Emergency Department and triage nurse.  Should you have questions after your visit or need to cancel or reschedule your appointment, please contact [facility]  Dept: [phone]  and follow the prompts.  Office hours are 8:00 a.m. to 4:30 p.m. Monday - Friday. Please note that voicemails left after 4:00 p.m. may not be returned until the following business day.  We are closed weekends and major holidays. You have access to a nurse at all times for urgent questions. Please call the main number to the clinic Dept: [phone] and follow the prompts.   For any non-urgent questions, you may also contact your provider using MyChart. We now offer e-Visits for anyone 14 and older to request care online for non-urgent symptoms. For details visit [email or web address].   Also download the MyChart app! Go to the app store, search "MyChart", open the app, select [facility], and log in with your MyChart username and password.

## 2023-07-04 LAB — T4: T4, Total: 11.2 ug/dL (ref 4.5–12.0)

## 2023-07-06 ENCOUNTER — Encounter (HOSPITAL_BASED_OUTPATIENT_CLINIC_OR_DEPARTMENT_OTHER): Payer: Self-pay | Admitting: *Deleted

## 2023-07-06 ENCOUNTER — Other Ambulatory Visit: Payer: Self-pay

## 2023-07-06 ENCOUNTER — Telehealth: Payer: Self-pay | Admitting: Oncology

## 2023-07-06 ENCOUNTER — Inpatient Hospital Stay (HOSPITAL_BASED_OUTPATIENT_CLINIC_OR_DEPARTMENT_OTHER)
Admission: EM | Admit: 2023-07-06 | Discharge: 2023-07-11 | DRG: 872 | Disposition: A | Discharge status: Home / Self Care | Payer: Medicare PPO | Attending: Internal Medicine | Admitting: Internal Medicine

## 2023-07-06 ENCOUNTER — Emergency Department (HOSPITAL_BASED_OUTPATIENT_CLINIC_OR_DEPARTMENT_OTHER): Payer: Medicare PPO

## 2023-07-06 DIAGNOSIS — R300 Dysuria: Secondary | ICD-10-CM | POA: Diagnosis present

## 2023-07-06 DIAGNOSIS — N1831 Chronic kidney disease, stage 3a: Secondary | ICD-10-CM | POA: Diagnosis present

## 2023-07-06 DIAGNOSIS — E119 Type 2 diabetes mellitus without complications: Secondary | ICD-10-CM

## 2023-07-06 DIAGNOSIS — E876 Hypokalemia: Secondary | ICD-10-CM | POA: Diagnosis present

## 2023-07-06 DIAGNOSIS — Z8541 Personal history of malignant neoplasm of cervix uteri: Secondary | ICD-10-CM | POA: Diagnosis not present

## 2023-07-06 DIAGNOSIS — E1122 Type 2 diabetes mellitus with diabetic chronic kidney disease: Secondary | ICD-10-CM | POA: Diagnosis present

## 2023-07-06 DIAGNOSIS — N390 Urinary tract infection, site not specified: Secondary | ICD-10-CM

## 2023-07-06 DIAGNOSIS — R6520 Severe sepsis without septic shock: Secondary | ICD-10-CM | POA: Diagnosis present

## 2023-07-06 DIAGNOSIS — Y842 Radiological procedure and radiotherapy as the cause of abnormal reaction of the patient, or of later complication, without mention of misadventure at the time of the procedure: Secondary | ICD-10-CM | POA: Diagnosis present

## 2023-07-06 DIAGNOSIS — D519 Vitamin B12 deficiency anemia, unspecified: Secondary | ICD-10-CM | POA: Diagnosis present

## 2023-07-06 DIAGNOSIS — N10 Acute tubulo-interstitial nephritis: Principal | ICD-10-CM

## 2023-07-06 DIAGNOSIS — R000 Tachycardia, unspecified: Secondary | ICD-10-CM | POA: Diagnosis not present

## 2023-07-06 DIAGNOSIS — Z888 Allergy status to other drugs, medicaments and biological substances status: Secondary | ICD-10-CM | POA: Diagnosis not present

## 2023-07-06 DIAGNOSIS — E872 Acidosis, unspecified: Secondary | ICD-10-CM | POA: Diagnosis present

## 2023-07-06 DIAGNOSIS — Z79899 Other long term (current) drug therapy: Secondary | ICD-10-CM

## 2023-07-06 DIAGNOSIS — E7800 Pure hypercholesterolemia, unspecified: Secondary | ICD-10-CM | POA: Diagnosis present

## 2023-07-06 DIAGNOSIS — N1832 Chronic kidney disease, stage 3b: Secondary | ICD-10-CM | POA: Diagnosis present

## 2023-07-06 DIAGNOSIS — A4150 Gram-negative sepsis, unspecified: Principal | ICD-10-CM | POA: Diagnosis present

## 2023-07-06 DIAGNOSIS — I129 Hypertensive chronic kidney disease with stage 1 through stage 4 chronic kidney disease, or unspecified chronic kidney disease: Secondary | ICD-10-CM | POA: Diagnosis present

## 2023-07-06 DIAGNOSIS — D509 Iron deficiency anemia, unspecified: Secondary | ICD-10-CM | POA: Diagnosis present

## 2023-07-06 DIAGNOSIS — R109 Unspecified abdominal pain: Secondary | ICD-10-CM | POA: Diagnosis not present

## 2023-07-06 DIAGNOSIS — Z1152 Encounter for screening for COVID-19: Secondary | ICD-10-CM | POA: Diagnosis not present

## 2023-07-06 DIAGNOSIS — Z833 Family history of diabetes mellitus: Secondary | ICD-10-CM | POA: Diagnosis not present

## 2023-07-06 DIAGNOSIS — Z808 Family history of malignant neoplasm of other organs or systems: Secondary | ICD-10-CM | POA: Diagnosis not present

## 2023-07-06 DIAGNOSIS — I10 Essential (primary) hypertension: Secondary | ICD-10-CM | POA: Diagnosis present

## 2023-07-06 DIAGNOSIS — R9389 Abnormal findings on diagnostic imaging of other specified body structures: Secondary | ICD-10-CM | POA: Diagnosis not present

## 2023-07-06 DIAGNOSIS — B9620 Unspecified Escherichia coli [E. coli] as the cause of diseases classified elsewhere: Secondary | ICD-10-CM | POA: Diagnosis present

## 2023-07-06 DIAGNOSIS — K529 Noninfective gastroenteritis and colitis, unspecified: Secondary | ICD-10-CM | POA: Diagnosis present

## 2023-07-06 DIAGNOSIS — C7800 Secondary malignant neoplasm of unspecified lung: Secondary | ICD-10-CM | POA: Diagnosis not present

## 2023-07-06 DIAGNOSIS — A419 Sepsis, unspecified organism: Secondary | ICD-10-CM | POA: Diagnosis not present

## 2023-07-06 DIAGNOSIS — K627 Radiation proctitis: Secondary | ICD-10-CM | POA: Diagnosis present

## 2023-07-06 DIAGNOSIS — R197 Diarrhea, unspecified: Secondary | ICD-10-CM | POA: Diagnosis present

## 2023-07-06 DIAGNOSIS — Z923 Personal history of irradiation: Secondary | ICD-10-CM

## 2023-07-06 DIAGNOSIS — Z7984 Long term (current) use of oral hypoglycemic drugs: Secondary | ICD-10-CM

## 2023-07-06 DIAGNOSIS — K5900 Constipation, unspecified: Secondary | ICD-10-CM | POA: Diagnosis present

## 2023-07-06 DIAGNOSIS — C539 Malignant neoplasm of cervix uteri, unspecified: Secondary | ICD-10-CM | POA: Diagnosis present

## 2023-07-06 DIAGNOSIS — K219 Gastro-esophageal reflux disease without esophagitis: Secondary | ICD-10-CM | POA: Diagnosis present

## 2023-07-06 DIAGNOSIS — N183 Chronic kidney disease, stage 3 unspecified: Secondary | ICD-10-CM | POA: Diagnosis present

## 2023-07-06 DIAGNOSIS — R310 Gross hematuria: Secondary | ICD-10-CM | POA: Diagnosis present

## 2023-07-06 DIAGNOSIS — K828 Other specified diseases of gallbladder: Secondary | ICD-10-CM | POA: Diagnosis not present

## 2023-07-06 DIAGNOSIS — Z0389 Encounter for observation for other suspected diseases and conditions ruled out: Secondary | ICD-10-CM | POA: Diagnosis not present

## 2023-07-06 LAB — COMPREHENSIVE METABOLIC PANEL WITH GFR
ALT: 7 U/L (ref 0–44)
AST: 19 U/L (ref 15–41)
Albumin: 3.5 g/dL (ref 3.5–5.0)
Alkaline Phosphatase: 50 U/L (ref 38–126)
Anion gap: 12 (ref 5–15)
BUN: 7 mg/dL — ABNORMAL LOW (ref 8–23)
CO2: 22 mmol/L (ref 22–32)
Calcium: 9.2 mg/dL (ref 8.9–10.3)
Chloride: 97 mmol/L — ABNORMAL LOW (ref 98–111)
Creatinine, Ser: 1.05 mg/dL — ABNORMAL HIGH (ref 0.44–1.00)
GFR, Estimated: 57 mL/min — ABNORMAL LOW (ref >60)
Glucose, Bld: 138 mg/dL — ABNORMAL HIGH (ref 70–99)
Potassium: 3 mmol/L — ABNORMAL LOW (ref 3.5–5.1)
Sodium: 131 mmol/L — ABNORMAL LOW (ref 135–145)
Total Bilirubin: 1.1 mg/dL (ref 0.3–1.2)
Total Protein: 6.6 g/dL (ref 6.5–8.1)

## 2023-07-06 LAB — CBC WITH DIFFERENTIAL/PLATELET
Abs Immature Granulocytes: 0.02 K/uL (ref 0.00–0.07)
Basophils Absolute: 0 K/uL (ref 0.0–0.1)
Basophils Relative: 0 %
Eosinophils Absolute: 0.2 K/uL (ref 0.0–0.5)
Eosinophils Relative: 3 %
HCT: 30.6 % — ABNORMAL LOW (ref 36.0–46.0)
Hemoglobin: 10.5 g/dL — ABNORMAL LOW (ref 12.0–15.0)
Immature Granulocytes: 0 %
Lymphocytes Relative: 15 %
Lymphs Abs: 0.7 K/uL (ref 0.7–4.0)
MCH: 32.6 pg (ref 26.0–34.0)
MCHC: 34.3 g/dL (ref 30.0–36.0)
MCV: 95 fL (ref 80.0–100.0)
Monocytes Absolute: 0.7 K/uL (ref 0.1–1.0)
Monocytes Relative: 14 %
Neutro Abs: 3.2 K/uL (ref 1.7–7.7)
Neutrophils Relative %: 68 %
Platelets: 157 K/uL (ref 150–400)
RBC: 3.22 MIL/uL — ABNORMAL LOW (ref 3.87–5.11)
RDW: 15.4 % (ref 11.5–15.5)
WBC: 4.7 K/uL (ref 4.0–10.5)
nRBC: 0 % (ref 0.0–0.2)

## 2023-07-06 LAB — URINALYSIS, W/ REFLEX TO CULTURE (INFECTION SUSPECTED)
Bacteria, UA: NONE SEEN
Bilirubin Urine: NEGATIVE
Glucose, UA: NEGATIVE mg/dL
Ketones, ur: 15 mg/dL — AB
Nitrite: NEGATIVE
Protein, ur: 100 mg/dL — AB
RBC / HPF: >50 RBC/hpf (ref 0–5)
Specific Gravity, Urine: 1.017 (ref 1.005–1.030)
WBC, UA: >50 WBC/hpf (ref 0–5)
pH: 5.5 (ref 5.0–8.0)

## 2023-07-06 LAB — RESP PANEL BY RT-PCR (RSV, FLU A&B, COVID)  RVPGX2
Influenza A by PCR: NEGATIVE
Influenza B by PCR: NEGATIVE
Resp Syncytial Virus by PCR: NEGATIVE
SARS Coronavirus 2 by RT PCR: NEGATIVE

## 2023-07-06 LAB — PROTIME-INR
INR: 1.2 (ref 0.8–1.2)
Prothrombin Time: 14.9 s (ref 11.4–15.2)

## 2023-07-06 LAB — LACTIC ACID, PLASMA
Lactic Acid, Venous: 1.4 mmol/L (ref 0.5–1.9)
Lactic Acid, Venous: 2.5 mmol/L (ref 0.5–1.9)

## 2023-07-06 LAB — APTT: aPTT: 29 s (ref 24–36)

## 2023-07-06 MED ORDER — ACETAMINOPHEN 500 MG PO TABS
1000.0000 mg | ORAL_TABLET | Freq: Once | ORAL | Status: AC
  Administered 2023-07-06: 1000 mg via ORAL
  Filled 2023-07-06: qty 2

## 2023-07-06 MED ORDER — ROSUVASTATIN CALCIUM 20 MG PO TABS
40.0000 mg | ORAL_TABLET | Freq: Every day | ORAL | Status: DC
  Administered 2023-07-06 – 2023-07-10 (×5): 40 mg via ORAL
  Filled 2023-07-06 (×5): qty 2

## 2023-07-06 MED ORDER — SODIUM CHLORIDE 0.9% FLUSH
10.0000 mL | Freq: Two times a day (BID) | INTRAVENOUS | Status: DC
  Administered 2023-07-06 – 2023-07-10 (×4): 10 mL

## 2023-07-06 MED ORDER — ATENOLOL 50 MG PO TABS: 100.0000 mg | ORAL_TABLET | Freq: Every day | ORAL | Status: DC

## 2023-07-06 MED ORDER — INSULIN ASPART 100 UNIT/ML IJ SOLN: 0.0000 [IU] | Freq: Three times a day (TID) | INTRAMUSCULAR | Status: DC

## 2023-07-06 MED ORDER — SODIUM CHLORIDE 0.9 % IV SOLN
2.0000 g | INTRAVENOUS | Status: DC
  Administered 2023-07-06 – 2023-07-07 (×2): 2 g via INTRAVENOUS
  Filled 2023-07-06 (×2): qty 20

## 2023-07-06 MED ORDER — LACTATED RINGERS IV SOLN: INTRAVENOUS | Status: DC

## 2023-07-06 MED ORDER — IOHEXOL 300 MG/ML  SOLN
100.0000 mL | Freq: Once | INTRAMUSCULAR | Status: AC | PRN
  Administered 2023-07-06: 100 mL via INTRAVENOUS

## 2023-07-06 MED ORDER — SODIUM CHLORIDE 0.9% FLUSH
10.0000 mL | INTRAVENOUS | Status: DC | PRN
  Administered 2023-07-09: 10 mL

## 2023-07-06 MED ORDER — SUCRALFATE 1 GM/10ML PO SUSP
2.0000 g | Freq: Two times a day (BID) | ORAL | Status: DC
  Administered 2023-07-06 – 2023-07-08 (×3): 2 g via RECTAL
  Filled 2023-07-06 (×5): qty 20

## 2023-07-06 MED ORDER — HEPARIN SODIUM (PORCINE) 5000 UNIT/ML IJ SOLN: 5000.0000 [IU] | Freq: Two times a day (BID) | INTRAMUSCULAR | Status: DC

## 2023-07-06 MED ORDER — CHLORHEXIDINE GLUCONATE CLOTH 2 % EX PADS
6.0000 | MEDICATED_PAD | Freq: Every day | CUTANEOUS | Status: DC
Start: 2023-07-06 — End: 2023-07-11
  Administered 2023-07-07 – 2023-07-10 (×5): 6 via TOPICAL

## 2023-07-06 MED ORDER — LACTATED RINGERS IV BOLUS: 150.0000 mL | Freq: Once | INTRAVENOUS | Status: DC

## 2023-07-06 MED ORDER — LACTATED RINGERS IV BOLUS (SEPSIS)
1000.0000 mL | Freq: Once | INTRAVENOUS | Status: AC
  Administered 2023-07-06: 1000 mL via INTRAVENOUS

## 2023-07-06 MED ORDER — POTASSIUM CHLORIDE CRYS ER 20 MEQ PO TBCR
40.0000 meq | EXTENDED_RELEASE_TABLET | Freq: Once | ORAL | Status: AC
  Administered 2023-07-06: 40 meq via ORAL
  Filled 2023-07-06: qty 2

## 2023-07-06 MED ORDER — PANTOPRAZOLE SODIUM 40 MG IV SOLR
40.0000 mg | Freq: Two times a day (BID) | INTRAVENOUS | Status: DC
  Administered 2023-07-06 – 2023-07-07 (×2): 40 mg via INTRAVENOUS
  Filled 2023-07-06 (×2): qty 10

## 2023-07-06 MED ORDER — HYDRALAZINE HCL 20 MG/ML IJ SOLN
5.0000 mg | Freq: Four times a day (QID) | INTRAMUSCULAR | Status: DC | PRN
  Filled 2023-07-06: qty 1

## 2023-07-06 MED ORDER — LACTATED RINGERS IV BOLUS: 1000.0000 mL | Freq: Once | INTRAVENOUS | Status: DC

## 2023-07-06 MED ORDER — AMLODIPINE BESYLATE 10 MG PO TABS
10.0000 mg | ORAL_TABLET | Freq: Every day | ORAL | Status: DC
  Administered 2023-07-06: 10 mg via ORAL
  Filled 2023-07-06: qty 1

## 2023-07-06 NOTE — Assessment & Plan Note (Signed)
Pt saw her oncologist on the 24th of October.  Consult oncology or urogyn as needed

## 2023-07-06 NOTE — Telephone Encounter (Signed)
Called Misty Deleon and let her know that Dr. Bertis Ruddy would like her to go to the ER at [facility] for evaluation.  Camy verbalized agreement and will go now.

## 2023-07-06 NOTE — ED Notes (Signed)
RN unavailable at this time and will give me a call back.

## 2023-07-06 NOTE — Telephone Encounter (Signed)
I suggest she goes to ER at [facility] for eval

## 2023-07-06 NOTE — Assessment & Plan Note (Signed)
From radiation proctitis.do not suspect infectious etiology however will get stool studies and cultures.  We will start pt on Sucralfate enema, have d/w pharmacy [facility] and asked to place order.

## 2023-07-06 NOTE — H&P (Signed)
History and Physical    Patient: Misty Deleon HKV:[id number] DOB: 01-24-1951 DOA: 07/06/2023 DOS: the patient was seen and examined on 07/06/2023 PCP: Marletta Lor, NP  Patient coming from: Home  Chief Complaint:  Chief Complaint  Patient presents with   Dysuria    HPI: Misty Deleon is a 72 y.o. female with medical history significant for cervical cancer on immunotherapy, CKD stage 3a, Htn ,pancytopenia history, c/h diarrhea suspect for radiation proctitis, h/o angioedema from lisinopril.  Sent from [facility] with complaints of abdominal pain and dysuria on chronic intermittent diarrhea constipation over the past several months.  Patient was having suprapubic pressure pelvic discomfort dysuria and prior primary care had prescribed antibiotic regimen which after completion she did not have any relief and presents today again with 3 days of fever as high as 101.7 that she noted last night.  Initially patient was hypotensive and tachycardic meeting severe sepsis criteria on arrival.  No white blood cell count however urinalysis was abnormal concerning for UTI CT abdomen and pelvis is consistent with radiation proctitis patient also has nonobstructing renal calculi and bladder wall thickening suggestive of UTI.  Patient received Rocephin in the emergency room and had electrolyte abnormalities and will close ordered isolation for C. difficile testing and GI pathogen along with urine and blood cultures being collected.  In emergency room vitals show Blood pressure (!) 150/64, pulse (!) 104, temperature 98.2 F (36.8 C), temperature source Oral, resp. rate (!) 24, SpO2 100%. Vitals:   07/06/23 1455 07/06/23 1700 07/06/23 1748 07/06/23 1831  BP:  (!) 109/59  (!) 150/64  Pulse:  (!) 109  (!) 104  Temp: 100.2 F (37.9 C)  (!) 100.4 F (38 C) 98.2 F (36.8 C)  Resp:  (!) 21  (!) 24  SpO2:  93%  100%  TempSrc: Oral  Oral Oral  Initial EKG showed sinus tachycardia at 119 PR interval 197  QTc of 482 right axis deviation TWI diffusely. No chest pain.        Labs are notable for : -Abnormal urinalysis more than 50 WBCs large leukocyte Estrace negative nitrite cloudy urine. -Blood and urine cultures collected in the emergency room procalcitonin collected in the ED. -Metabolic panel showing hyponatremia of 131 hypokalemia of 3.0 glucose 138 CKD stage III yea creatinine 1.05 EGFR 57 -Lactic acid of 2.5. -Blood count showing normal white count hemoglobin 10.5 platelet counts of 157. -Respiratory panel negative for flu RSV and COVID. -CT chest abdomen and pelvis with contrast showing , nonobstructive stones, UTI, proctitis.  Pt received: Medications  lactated ringers infusion ( Intravenous New Bag/Given 07/06/23 1743)  cefTRIAXone (ROCEPHIN) 2 g in sodium chloride 0.9 % 100 mL IVPB (0 g Intravenous Stopped 07/06/23 1426)  amLODipine (NORVASC) tablet 10 mg (10 mg Oral Given 07/06/23 2052)  atenolol (TENORMIN) tablet 100 mg (has no administration in time range)  rosuvastatin (CRESTOR) tablet 40 mg (has no administration in time range)  pantoprazole (PROTONIX) injection 40 mg (40 mg Intravenous Given 07/06/23 2052)  insulin aspart (novoLOG) injection 0-9 Units (has no administration in time range)  sodium chloride flush (NS) 0.9 % injection 10-40 mL (has no administration in time range)  sodium chloride flush (NS) 0.9 % injection 10-40 mL (has no administration in time range)  Chlorhexidine Gluconate Cloth 2 % PADS 6 each (has no administration in time range)  sucralfate (CARAFATE) 1 GM/10ML suspension 2 g (has no administration in time range)  lactated ringers bolus 1,000 mL (0 mLs  Intravenous Stopped 07/06/23 1426)  iohexol (OMNIPAQUE) 300 MG/ML solution 100 mL (100 mLs Intravenous Contrast Given 07/06/23 1310)  acetaminophen (TYLENOL) tablet 1,000 mg (1,000 mg Oral Given 07/06/23 1500)  potassium chloride SA (KLOR-CON M) CR tablet 40 mEq (40 mEq Oral Given 07/06/23 1743)     Review of Systems  Gastrointestinal:  Positive for abdominal pain, constipation and diarrhea.  Genitourinary:  Positive for dysuria, frequency, hematuria and urgency.       Pelvic pressure/ suprapubic pain.    Past Medical History:  Diagnosis Date   CKD (chronic kidney disease), stage III (HCC)    Deficiency anemia    B12 def.  treated with b12 injection and has received iron infusion's   GERD (gastroesophageal reflux disease)    History of radiation therapy 09/22/20-11/01/20   Cervix, IMRT    Dr. Antony Blackbird   History of radiation therapy 11/09/2020-12/08/2020   cervix, intracavitary brachytherapy   Dr Antony Blackbird   Hypercholesteremia    Hypertension    Hypomagnesemia    Malignant neoplasm cervix [facility]) oncologist--- dr gorsuch/  radiation oncology--- dr Roselind Messier   dx 12/ 2021,  Stage IIIC-1,  with pelvic adeopathy;  concurrent chemo/ radiation;  chemo started 09-20-2020 and pt completed IMRT 11-01-2020 / scheduled for high dose brachytherapy to start 11-09-2020   PCOS (polycystic ovarian syndrome)    PMB (postmenopausal bleeding)    Type 2 diabetes mellitus (HCC)    followed by pc   (11-04-2020 pt stated does not check blood sugar)   Wears contact lenses    Past Surgical History:  Procedure Laterality Date   CESAREAN SECTION  x2 last one 1977   IR IMAGING GUIDED PORT INSERTION  09/16/2020   OPERATIVE ULTRASOUND N/A 11/09/2020   Procedure: OPERATIVE ULTRASOUND;  Surgeon: Antony Blackbird, MD;  Location: [facility];  Service: Urology;  Laterality: N/A;   OPERATIVE ULTRASOUND N/A 11/16/2020   Procedure: OPERATIVE ULTRASOUND;  Surgeon: Antony Blackbird, MD;  Location: [facility];  Service: Urology;  Laterality: N/A;   OPERATIVE ULTRASOUND N/A 11/25/2020   Procedure: OPERATIVE ULTRASOUND;  Surgeon: Antony Blackbird, MD;  Location: [facility];  Service: Urology;  Laterality: N/A;   OPERATIVE ULTRASOUND N/A 11/29/2020   Procedure: OPERATIVE ULTRASOUND;   Surgeon: Antony Blackbird, MD;  Location: [facility];  Service: Urology;  Laterality: N/A;   OPERATIVE ULTRASOUND N/A 12/08/2020   Procedure: OPERATIVE ULTRASOUND;  Surgeon: Antony Blackbird, MD;  Location: [facility];  Service: Urology;  Laterality: N/A;   TANDEM RING INSERTION N/A 11/09/2020   Procedure: TANDEM RING INSERTION;  Surgeon: Antony Blackbird, MD;  Location: [facility];  Service: Urology;  Laterality: N/A;   TANDEM RING INSERTION N/A 11/16/2020   Procedure: TANDEM RING INSERTION;  Surgeon: Antony Blackbird, MD;  Location: [facility];  Service: Urology;  Laterality: N/A;   TANDEM RING INSERTION N/A 11/25/2020   Procedure: TANDEM RING INSERTION;  Surgeon: Antony Blackbird, MD;  Location: [facility];  Service: Urology;  Laterality: N/A;   TANDEM RING INSERTION N/A 11/29/2020   Procedure: TANDEM RING INSERTION;  Surgeon: Antony Blackbird, MD;  Location: [facility];  Service: Urology;  Laterality: N/A;   TANDEM RING INSERTION N/A 12/08/2020   Procedure: TANDEM RING INSERTION;  Surgeon: Antony Blackbird, MD;  Location: [facility];  Service: Urology;  Laterality: N/A;   WISDOM TOOTH EXTRACTION  1980s    reports that she has never smoked. She  has never used smokeless tobacco. She reports that she does not drink alcohol and does not use drugs.  Allergies  Allergen Reactions   Lisinopril Swelling and Other (See Comments)    Entire tongue became swollen and the exterior front of the throat. No shortness of breath noted.    Family History  Problem Relation Age of Onset   Diabetes Father    Brain cancer Son    Breast cancer Neg Hx    Colon cancer Neg Hx    Ovarian cancer Neg Hx    Endometrial cancer Neg Hx    Prostate cancer Neg Hx    Pancreatic cancer Neg Hx     Prior to Admission medications   Medication Sig Start Date End Date Taking? Authorizing Provider  amLODipine (NORVASC) 10 MG tablet Take  1 tablet (10 mg total) by mouth daily. 01/06/23   Simonne Martinet, NP  atenolol (TENORMIN) 100 MG tablet Take 1 tablet (100 mg total) by mouth daily. Take in the morning 05/22/23   Artis Delay, MD  calcium carbonate (TUMS - DOSED IN MG ELEMENTAL CALCIUM) 500 MG chewable tablet Chew 1 tablet by mouth daily.    [provider]  cholecalciferol (VITAMIN D3) 25 MCG (1000 UNIT) tablet Take 2,000 Units by mouth daily.    [provider]  diphenhydrAMINE (BENADRYL ALLERGY) 25 MG tablet Take 2 tablets (50 mg total) by mouth every 6 (six) hours as needed for allergies. 01/05/23   Simonne Martinet, NP  ezetimibe (ZETIA) 10 MG tablet Take 10 mg by mouth in the morning.    [provider]  famotidine (PEPCID) 20 MG tablet Take 1 tablet (20 mg total) by mouth daily for 5 days. 01/05/23 01/10/23  Simonne Martinet, NP  glimepiride (AMARYL) 1 MG tablet Take 1 mg by mouth daily with breakfast. 07/07/22   [provider]  lidocaine-prilocaine (EMLA) cream Apply 1 Application topically as needed (for port access). 06/12/23   Artis Delay, MD  losartan (COZAAR) 100 MG tablet Take 1 tablet (100 mg total) by mouth every evening. 05/22/23   Artis Delay, MD  losartan (COZAAR) 50 MG tablet TAKE 1 TABLET(50 MG) BY MOUTH EVERY EVENING 06/12/23   Bertis Ruddy, Ni, MD  ondansetron (ZOFRAN) 8 MG tablet Take 1 tablet (8 mg total) by mouth every 8 (eight) hours as needed for nausea or vomiting. Start on the third day after chemotherapy. 09/01/22   Artis Delay, MD  prochlorperazine (COMPAZINE) 10 MG tablet Take 1 tablet (10 mg total) by mouth every 6 (six) hours as needed for nausea or vomiting. 09/01/22   Artis Delay, MD  rosuvastatin (CRESTOR) 40 MG tablet Take 40 mg by mouth at bedtime.    [provider]  sitaGLIPtin (JANUVIA) 100 MG tablet Take 50 mg by mouth in the morning. 03/30/22   [provider]  traMADol (ULTRAM) 50 MG tablet Take 2 tablets (100 mg total) by mouth every 6 (six) hours  as needed. 07/03/23   Artis Delay, MD  TYLENOL 500 MG tablet Take 500-1,000 mg by mouth every 6 (six) hours as needed for mild pain or headache.    [provider]  vitamin B-12 (CYANOCOBALAMIN) 1000 MCG tablet Take 1,000 mcg by mouth in the morning.    [provider]     Vitals:   07/06/23 1455 07/06/23 1700 07/06/23 1748 07/06/23 1831  BP:  (!) 109/59  (!) 150/64  Pulse:  (!) 109  (!) 104  Resp:  (!) 21  Marland Kitchen)  24  Temp: 100.2 F (37.9 C)  (!) 100.4 F (38 C) 98.2 F (36.8 C)  TempSrc: Oral  Oral Oral  SpO2:  93%  100%   Physical Exam Vitals and nursing note reviewed.  Constitutional:      General: She is not in acute distress. HENT:     Head: Normocephalic and atraumatic.     Right Ear: Hearing normal.     Left Ear: Hearing normal.     Nose: Nose normal. No nasal deformity.     Mouth/Throat:     Lips: Pink.     Tongue: No lesions.     Pharynx: Oropharynx is clear.  Eyes:     General: Lids are normal.     Extraocular Movements: Extraocular movements intact.  Cardiovascular:     Rate and Rhythm: Regular rhythm. Tachycardia present.     Heart sounds: Normal heart sounds.  Pulmonary:     Effort: Pulmonary effort is normal.     Breath sounds: Normal breath sounds.  Abdominal:     General: Bowel sounds are normal. There is no distension.     Palpations: Abdomen is soft. There is no mass.     Tenderness: There is no abdominal tenderness.  Musculoskeletal:     Right lower leg: No edema.     Left lower leg: No edema.  Skin:    General: Skin is warm.  Neurological:     General: No focal deficit present.     Mental Status: She is alert and oriented to person, place, and time.     Cranial Nerves: Cranial nerves 2-12 are intact.  Psychiatric:        Attention and Perception: Attention normal.        Mood and Affect: Mood normal.        Speech: Speech normal.        Behavior: Behavior normal. Behavior is cooperative.    Labs on Admission: I have  personally reviewed following labs and imaging studies Results for orders placed or performed during the hospital encounter of 07/06/23 (from the past 24 hour(s))  Comprehensive metabolic panel     Status: Abnormal   Collection Time: 07/06/23 12:08 PM  Result Value Ref Range   Sodium 131 (L) 135 - 145 mmol/L   Potassium 3.0 (L) 3.5 - 5.1 mmol/L   Chloride 97 (L) 98 - 111 mmol/L   CO2 22 22 - 32 mmol/L   Glucose, Bld 138 (H) 70 - 99 mg/dL   BUN 7 (L) 8 - 23 mg/dL   Creatinine, Ser 4.54 (H) 0.44 - 1.00 mg/dL   Calcium 9.2 8.9 - 09.8 mg/dL   Total Protein 6.6 6.5 - 8.1 g/dL   Albumin 3.5 3.5 - 5.0 g/dL   AST 19 15 - 41 U/L   ALT 7 0 - 44 U/L   Alkaline Phosphatase 50 38 - 126 U/L   Total Bilirubin 1.1 0.3 - 1.2 mg/dL   GFR, Estimated 57 (L) >60 mL/min   Anion gap 12 5 - 15  CBC with Differential     Status: Abnormal   Collection Time: 07/06/23 12:08 PM  Result Value Ref Range   WBC 4.7 4.0 - 10.5 K/uL   RBC 3.22 (L) 3.87 - 5.11 MIL/uL   Hemoglobin 10.5 (L) 12.0 - 15.0 g/dL   HCT 11.9 (L) 14.7 - 82.9 %   MCV 95.0 80.0 - 100.0 fL   MCH 32.6 26.0 - 34.0 pg   MCHC 34.3 30.0 -  36.0 g/dL   RDW 16.1 09.6 - 04.5 %   Platelets 157 150 - 400 K/uL   nRBC 0.0 0.0 - 0.2 %   Neutrophils Relative % 68 %   Neutro Abs 3.2 1.7 - 7.7 K/uL   Lymphocytes Relative 15 %   Lymphs Abs 0.7 0.7 - 4.0 K/uL   Monocytes Relative 14 %   Monocytes Absolute 0.7 0.1 - 1.0 K/uL   Eosinophils Relative 3 %   Eosinophils Absolute 0.2 0.0 - 0.5 K/uL   Basophils Relative 0 %   Basophils Absolute 0.0 0.0 - 0.1 K/uL   Immature Granulocytes 0 %   Abs Immature Granulocytes 0.02 0.00 - 0.07 K/uL  Urinalysis, w/ Reflex to Culture (Infection Suspected) -Urine, Clean Catch     Status: Abnormal   Collection Time: 07/06/23 12:09 PM  Result Value Ref Range   Specimen Source URINE, CLEAN CATCH    Color, Urine YELLOW YELLOW   APPearance CLOUDY (A) CLEAR   Specific Gravity, Urine 1.017 1.005 - 1.030   pH 5.5 5.0 - 8.0    Glucose, UA NEGATIVE NEGATIVE mg/dL   Hgb urine dipstick LARGE (A) NEGATIVE   Bilirubin Urine NEGATIVE NEGATIVE   Ketones, ur 15 (A) NEGATIVE mg/dL   Protein, ur 409 (A) NEGATIVE mg/dL   Nitrite NEGATIVE NEGATIVE   Leukocytes,Ua LARGE (A) NEGATIVE   RBC / HPF >50 0 - 5 RBC/hpf   WBC, UA >50 0 - 5 WBC/hpf   Bacteria, UA NONE SEEN NONE SEEN   Squamous Epithelial / HPF 0-5 0 - 5 /HPF   WBC Clumps PRESENT    Mucus PRESENT   Resp panel by RT-PCR (RSV, Flu A&B, Covid) Anterior Nasal Swab     Status: None   Collection Time: 07/06/23 12:43 PM   Specimen: Anterior Nasal Swab  Result Value Ref Range   SARS Coronavirus 2 by RT PCR NEGATIVE NEGATIVE   Influenza A by PCR NEGATIVE NEGATIVE   Influenza B by PCR NEGATIVE NEGATIVE   Resp Syncytial Virus by PCR NEGATIVE NEGATIVE  Lactic acid, plasma     Status: None   Collection Time: 07/06/23 12:43 PM  Result Value Ref Range   Lactic Acid, Venous 1.4 0.5 - 1.9 mmol/L  Protime-INR     Status: None   Collection Time: 07/06/23 12:43 PM  Result Value Ref Range   Prothrombin Time 14.9 11.4 - 15.2 seconds   INR 1.2 0.8 - 1.2  APTT     Status: None   Collection Time: 07/06/23 12:43 PM  Result Value Ref Range   aPTT 29 24 - 36 seconds  Lactic acid, plasma     Status: Abnormal   Collection Time: 07/06/23  2:37 PM  Result Value Ref Range   Lactic Acid, Venous 2.5 (HH) 0.5 - 1.9 mmol/L    CBC: Recent Labs  Lab 07/03/23 1030 07/06/23 1208  WBC 4.7 4.7  NEUTROABS 2.9 3.2  HGB 10.4* 10.5*  HCT 30.8* 30.6*  MCV 98.4 95.0  PLT 179 157   Basic Metabolic Panel: Recent Labs  Lab 07/03/23 1030 07/06/23 1208  NA 136 131*  K 3.3* 3.0*  CL 103 97*  CO2 26 22  GLUCOSE 143* 138*  BUN 11 7*  CREATININE 1.18* 1.05*  CALCIUM 9.3 9.2   GFR: Estimated Creatinine Clearance: 51.4 mL/min (A) (by C-G formula based on SCr of 1.05 mg/dL (H)). Liver Function Tests: Recent Labs  Lab 07/03/23 1030 07/06/23 1208  AST 18 19  ALT 9  7  ALKPHOS 46  50  BILITOT 0.8 1.1  PROT 6.6 6.6  ALBUMIN 3.5 3.5   No results for input(s): "LIPASE", "AMYLASE" in the last 168 hours. No results for input(s): "AMMONIA" in the last 168 hours. Coagulation Profile: Recent Labs  Lab 07/06/23 1243  INR 1.2   Cardiac Enzymes: No results for input(s): "CKTOTAL", "CKMB", "CKMBINDEX", "TROPONINI" in the last 168 hours. BNP (last 3 results) No results for input(s): "PROBNP" in the last 8760 hours. HbA1C: No results for input(s): "HGBA1C" in the last 72 hours. CBG: No results for input(s): "GLUCAP" in the last 168 hours. Lipid Profile: No results for input(s): "CHOL", "HDL", "LDLCALC", "TRIG", "CHOLHDL", "LDLDIRECT" in the last 72 hours. Thyroid Function Tests: No results for input(s): "TSH", "T4TOTAL", "FREET4", "T3FREE", "THYROIDAB" in the last 72 hours. Anemia Panel: No results for input(s): "VITAMINB12", "FOLATE", "FERRITIN", "TIBC", "IRON", "RETICCTPCT" in the last 72 hours. Urinalysis    Component Value Date/Time   COLORURINE YELLOW 07/06/2023 1209   APPEARANCEUR CLOUDY (A) 07/06/2023 1209   LABSPEC 1.017 07/06/2023 1209   PHURINE 5.5 07/06/2023 1209   GLUCOSEU NEGATIVE 07/06/2023 1209   HGBUR LARGE (A) 07/06/2023 1209   BILIRUBINUR NEGATIVE 07/06/2023 1209   KETONESUR 15 (A) 07/06/2023 1209   PROTEINUR 100 (A) 07/06/2023 1209   NITRITE NEGATIVE 07/06/2023 1209   LEUKOCYTESUR LARGE (A) 07/06/2023 1209    Unresulted Labs (From admission, onward)     Start     Ordered   07/06/23 2158  Lactic acid, plasma  (Lactic Acid)  STAT Now then every 3 hours,   R (with STAT occurrences)      07/06/23 2157   07/06/23 1859  Type and screen  Once,   R        07/06/23 1900   07/06/23 1255  Gastrointestinal Panel by PCR , Stool  (Gastrointestinal Panel by PCR, Stool                                                                                                                                                     **Does Not include CLOSTRIDIUM DIFFICILE  testing. **If CDIFF testing is needed, place order from the "C Difficile Testing" order set.**)  Once,   R        07/06/23 1254   07/06/23 1255  C Difficile Quick Screen w PCR reflex  (C Difficile quick screen w PCR reflex panel )  Once, for 24 hours,   TIMED       References:    CDiff Information Tool   07/06/23 1254   07/06/23 1209  Urine Culture  Once,   R        07/06/23 1209   07/06/23 1208  Blood Culture (routine x 2)  (Septic presentation on arrival (screening labs, nursing and treatment orders for obvious sepsis))  BLOOD CULTURE  X 2,   STAT      07/06/23 1208   Unscheduled  Occult blood card to lab, stool RN will collect  As needed,   R     Question:  Specimen to be collected by:  Answer:  RN will collect   07/06/23 1900            Medications  lactated ringers infusion ( Intravenous New Bag/Given 07/06/23 1743)  cefTRIAXone (ROCEPHIN) 2 g in sodium chloride 0.9 % 100 mL IVPB (0 g Intravenous Stopped 07/06/23 1426)  amLODipine (NORVASC) tablet 10 mg (10 mg Oral Given 07/06/23 2052)  atenolol (TENORMIN) tablet 100 mg (has no administration in time range)  rosuvastatin (CRESTOR) tablet 40 mg (has no administration in time range)  pantoprazole (PROTONIX) injection 40 mg (40 mg Intravenous Given 07/06/23 2052)  insulin aspart (novoLOG) injection 0-9 Units (has no administration in time range)  sodium chloride flush (NS) 0.9 % injection 10-40 mL (has no administration in time range)  sodium chloride flush (NS) 0.9 % injection 10-40 mL (has no administration in time range)  Chlorhexidine Gluconate Cloth 2 % PADS 6 each (has no administration in time range)  sucralfate (CARAFATE) 1 GM/10ML suspension 2 g (has no administration in time range)  lactated ringers bolus 1,000 mL (0 mLs Intravenous Stopped 07/06/23 1426)  iohexol (OMNIPAQUE) 300 MG/ML solution 100 mL (100 mLs Intravenous Contrast Given 07/06/23 1310)  acetaminophen (TYLENOL) tablet 1,000 mg (1,000 mg Oral Given 07/06/23  1500)  potassium chloride SA (KLOR-CON M) CR tablet 40 mEq (40 mEq Oral Given 07/06/23 1743)    Radiological Exams on Admission: CT ABDOMEN PELVIS W CONTRAST  Result Date: 07/06/2023 CLINICAL DATA:  Left lower quadrant pain. Fever, nausea, vomiting and diarrhea. History of cervical cancer with lung metastases. EXAM: CT ABDOMEN AND PELVIS WITH CONTRAST TECHNIQUE: Multidetector CT imaging of the abdomen and pelvis was performed using the standard protocol following bolus administration of intravenous contrast. RADIATION DOSE REDUCTION: This exam was performed according to the departmental dose-optimization program which includes automated exposure control, adjustment of the mA and/or kV according to patient size and/or use of iterative reconstruction technique. CONTRAST:  OMNIPAQUE IOHEXOL 300 MG/ML  SOLN COMPARISON:  CT 05/17/2023 and older. FINDINGS: Lower chest: Lung bases are grossly clear. No pleural effusion. Trace pericardial fluid. Hepatobiliary: Fatty liver infiltration. No space-occupying liver lesion. Hepatic granulomas. Patent portal vein. Gallbladder is distended. Pancreas: Unremarkable. No pancreatic ductal dilatation or surrounding inflammatory changes. Spleen: Splenic granulomas are identified. Small splenule anteriorly and in the hilum. Adrenals/Urinary Tract: Adrenal glands are preserved. No left-sided enhancing renal mass or collecting system dilatation. The left ureter has a normal course and caliber down to the bladder. The bladder is contracted with some wall thickening and stranding. Please correlate for any clinical evidence of cystitis. Bifid left renal collecting system. The previous 7 mm stone in the right renal pelvis is now in the lower pole of the right kidney. Second nonobstructing upper pole stone is stable. However there is new moderate right renal collecting system dilatation with increasing perinephric stranding. There is some urothelial thickening as well along the  course of the right renal pelvis and ureter. The ureter does slowly taper to a more normal caliber towards the UVJ. No distal ureteral stone. Please correlate for any signs of urothelial infection. Stomach/Bowel: Stomach is relatively decompressed. The small bowel is nondilated. Large bowel has a normal course and caliber with some scattered colonic diverticula particularly of the sigmoid colon. There is  wall thickening seen in the area of the sigmoid colon with the adjacent stranding and thickening of the mesorectal fascia. Vascular/Lymphatic: Aortic atherosclerosis. No enlarged abdominal or pelvic lymph nodes. Reproductive: Uterus is present. No adnexal mass. Increasing soft tissue stranding. Other: No free air.  No developing ascites. Musculoskeletal: Scattered degenerative changes of the spine and pelvis. Stable compression of the inferior endplate of L5 with sclerosis. IMPRESSION: Interval development of ectasia of the right renal collecting system with urothelial thickening and stranding. There are some nonobstructing intrarenal stones but no ureteral stones. Please correlate for any known history including for a urothelial infection. The urinary bladder is also thickened and there is some adjacent stranding. Please correlate for any suggestion a developing stricture. Overall with appearance would recommend follow up to confirm resolution and exclude secondary pathology. Increasing wall thickening along the rectum with perirectal space stranding and thickening of the mesorectal fascia. Please correlate for known history of radiation. 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 Electronically Signed   By: Karen Kays M.D.   On: 07/06/2023 14:43   DG Chest Port 1 View  Result Date: 07/06/2023 CLINICAL DATA:  Questionable sepsis - evaluate for abnormality. Abdominal pain. EXAM: PORTABLE CHEST 1 VIEW COMPARISON:  08/24/2022. FINDINGS: Bilateral lung fields are clear. Again seen is elevated right hemidiaphragm.  Bilateral costophrenic angles are clear. Normal cardio-mediastinal silhouette. No acute osseous abnormalities. The soft tissues are within normal limits. Right-sided CT Port-A-Cath is seen with its tip overlying the upper portion of superior vena cava. IMPRESSION: *No active disease. Electronically Signed   By: Jules Schick M.D.   On: 07/06/2023 14:15     Data Reviewed: Relevant notes from primary care and specialist visits, past discharge summaries as available in EHR, including Care Everywhere. Prior diagnostic testing as pertinent to current admission diagnoses Updated medications and problem lists for reconciliation ED course, including vitals, labs, imaging, treatment and response to treatment Triage notes, nursing and pharmacy notes and ED provider's notes Notable results as noted in HPI  Assessment and Plan: 72 year old lady with history of cervical cancer status post immunotherapy , she was diagnosed with UTI and probably was treated with antibiotics.CT abdomen pelvis consistent with proctitis, likely secondary to radiation. She also has some nonobstructing renal calculi and some stranding in the urothelial region suggesting possible pyelonephritis.   Assessment & Plan Dysuria Dysuria / UTI: Urinalysis    Component Value Date/Time   COLORURINE YELLOW 07/06/2023 1209   APPEARANCEUR CLOUDY (A) 07/06/2023 1209   LABSPEC 1.017 07/06/2023 1209   PHURINE 5.5 07/06/2023 1209   GLUCOSEU NEGATIVE 07/06/2023 1209   HGBUR LARGE (A) 07/06/2023 1209   BILIRUBINUR NEGATIVE 07/06/2023 1209   KETONESUR 15 (A) 07/06/2023 1209   PROTEINUR 100 (A) 07/06/2023 1209   NITRITE NEGATIVE 07/06/2023 1209   LEUKOCYTESUR LARGE (A) 07/06/2023 1209  Urine cx collected  follow C/S. Cont rocephin.   Cervical cancer (HCC) Pt saw her oncologist on the 24th of October.  Consult oncology or urogyn as needed Chronic kidney disease (CKD), stage III (moderate) (HCC) Lab Results  Component Value Date    CREATININE 1.05 (H) 07/06/2023   CREATININE 1.18 (H) 07/03/2023   CREATININE 1.23 (H) 06/26/2023  Stable avoid contrast and renally dose meds.    Diabetes mellitus without complication (HCC) Glycemic protocol.  Hypomagnesemia Replace and follow levels.  Essential hypertension Vitals:   07/06/23 1108 07/06/23 1230 07/06/23 1400 07/06/23 1700  BP: (!) 138/58 (!) 111/49 (!) 146/59 (!) 109/59   07/06/23 1831  BP: Marland Kitchen)  150/64  Improved.  We will HOLD amlodipine, losartan and atenolol. PRN hydralazine with parameters if needed.   Severe sepsis (HCC) Pt meets severe sepsis with organ dysfunction with elevated lactic and repeat levels are pending.  Cultures are collected. Pt is continued on MIVF and will consider midodrine. No echo in chart.  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 She also has had imaging  of abd ct in past showing radiation proctitis.  Diarrhea From radiation proctitis.do not suspect infectious etiology however will get stool studies and cultures.  We will start pt on Sucralfate enema, have d/w pharmacy Lyn[facility] Ang and asked to place order.  DVT prophylaxis:  Heparin held as pt is passing clots in her urine and has h/o pancytopenia.  Scd's   Consults:  None  Advance Care Planning:    Code Status: Prior   Family Communication:  Spouse at bedside  Disposition Plan:  Home   Severity of Illness: The appropriate patient status for this patient is INPATIENT. Inpatient status is judged to be reasonable and necessary in order to provide the required intensity of service to ensure the patient's safety. The patient's presenting symptoms, physical exam findings, and initial radiographic and laboratory data in the context of their chronic comorbidities is felt to place them at high risk for further clinical deterioration. Furthermore, it is not anticipated that the patient will be medically stable for discharge from the hospital within 2  midnights of admission.   * I certify that at the point of admission it is my clinical judgment that the patient will require inpatient hospital care spanning beyond 2 midnights from the point of admission due to high intensity of service, high risk for further deterioration and high frequency of surveillance required.*  Author: Gertha Calkin, MD 07/06/2023 10:02 PM  For on call review [email or web address].

## 2023-07-06 NOTE — ED Triage Notes (Addendum)
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 She had n/v Wednesday, diarrhea yesterday.  Fever up to 101.51F yesterday.  Pt also reprots blood in her urine.  She called her oncologist and they advised she should come here for evaluation. Last treatment was Tuesday. Pt is having pain and burning as well as bleeding with urination.

## 2023-07-06 NOTE — Assessment & Plan Note (Signed)
Dysuria / UTI: Urinalysis    Component Value Date/Time   COLORURINE YELLOW 07/06/2023 1209   APPEARANCEUR CLOUDY (A) 07/06/2023 1209   LABSPEC 1.017 07/06/2023 1209   PHURINE 5.5 07/06/2023 1209   GLUCOSEU NEGATIVE 07/06/2023 1209   HGBUR LARGE (A) 07/06/2023 1209   BILIRUBINUR NEGATIVE 07/06/2023 1209   KETONESUR 15 (A) 07/06/2023 1209   PROTEINUR 100 (A) 07/06/2023 1209   NITRITE NEGATIVE 07/06/2023 1209   LEUKOCYTESUR LARGE (A) 07/06/2023 1209  Urine cx collected  follow C/S. Cont rocephin.

## 2023-07-06 NOTE — Telephone Encounter (Signed)
Misty Deleon called and is not feeling well.  She had vomiting on Wednesday and was not able to keep her nausea medication down.  Yesterday she had diarrhea 3 times during the day and during the night.  She also ran a fever yesterday of 101.7.  She took Tylenol and does not have a fever this morning.  Her stomach is still bothering her but she has not had vomiting or diarrhea this morning. She is drinking Powerade and eating small amounts. She reports feeling weak.  She also reports is having blood in her urine that started this week. Sometimes it is one spot of bright red blood and other times it looks like a clot and is darker in color.

## 2023-07-06 NOTE — Assessment & Plan Note (Signed)
Pt meets severe sepsis with organ dysfunction with elevated lactic and repeat levels are pending.  Cultures are collected. Pt is continued on MIVF and will consider midodrine. No echo in chart.

## 2023-07-06 NOTE — Assessment & Plan Note (Signed)
Glycemic protocol.   

## 2023-07-06 NOTE — Assessment & Plan Note (Signed)
Replete and recheck 

## 2023-07-06 NOTE — Assessment & Plan Note (Signed)
Vitals:   07/06/23 1108 07/06/23 1230 07/06/23 1400 07/06/23 1700  BP: (!) 138/58 (!) 111/49 (!) 146/59 (!) 109/59   07/06/23 1831  BP: (!) 150/64  Improved.  We will HOLD amlodipine, losartan and atenolol. PRN hydralazine with parameters if needed.

## 2023-07-06 NOTE — ED Notes (Signed)
Called [facility] for transport; bed assignment is ready

## 2023-07-06 NOTE — Assessment & Plan Note (Signed)
Lab Results  Component Value Date   CREATININE 1.05 (H) 07/06/2023   CREATININE 1.18 (H) 07/03/2023   CREATININE 1.23 (H) 06/26/2023  Stable avoid contrast and renally dose meds.

## 2023-07-06 NOTE — Plan of Care (Signed)
Hospitalist acceptance note:  This is a 72 year old lady with history of cervical cancer status post immunotherapy and radiation presented to ED with constellation of symptoms.  Per ED physician, patient has been having some abdominal pain and dysuria for about a month.  As outpatient, she was diagnosed with UTI and probably was treated with antibiotics negative cultures as well but she continued to have dysuria worse since last week and she has been having some fever since last 3 days with last temperature spike of 101.7 last night.  She came to the ED.  Initially blood pressure was low and then blood pressure improved and she became tachycardic.  No leukocytosis but UA consistent with UTI.  CT abdomen pelvis consistent with proctitis, likely secondary to radiation.  She also has some nonobstructing renal calculi and some stranding in the urothelial region suggesting possible pyelonephritis.  Patient received Rocephin in the ED.  She is also hypokalemic, has not received any potassium replacement.  I have advised the ED physician to replace potassium as well.  CKD at her baseline.  Of note, she also has been having some diarrhea since last 3 days so C. difficile is being tested as well as GI pathogen panel.  COVID-negative.  Urine and blood cultures have been drawn in the ED.  Patient has been accepted to hospitalist service in progressive care unit due to tachycardia.  ED physician to remain responsible for all patient care until patient arrives to his long campus and then [facility] will take over.  Nursing staff, please call patient placement once patient arrives to the floor at [facility].

## 2023-07-06 NOTE — ED Notes (Signed)
1st Blood culture 1201 2nd blood culture 1246

## 2023-07-06 NOTE — Assessment & Plan Note (Signed)
Pt has last radiation treatment about 2 years ago. She also has had imaging  of abd ct in past showing radiation proctitis.

## 2023-07-06 NOTE — ED Notes (Signed)
Pt labelled a fall risk due to reported weakness and age.  Fall risk socks and fall risk band placed on pt feet and wrist. Personal belonging and call bell within reach. Fall risk sign placed outside of pt door. Family at bedside Pt educated on the importance of using call bell before attempting to ambulate.

## 2023-07-06 NOTE — ED Provider Notes (Signed)
[facility] EMERGENCY DEPARTMENT AT [facility] Provider Note   CSN: [id number] Arrival date & time: 07/06/23  1102     History  Chief Complaint  Patient presents with   Dysuria    Misty Deleon is a 72 y.o. female.  HPI    72 year old female with a history of cervical cancer on chemotherapy, hypertension who presents with concern for abdominal pain, nausea, vomiting, diarrhea, dysuria, and fever starting last night.  Had nausea and vomiting beginning on Wednesday, unable to keep her nausea medications down.  Yesterday she had diarrhea 3 times during the day.  Had a fever last night to 101.7 at home.  Has had some blood in her urine that started this week  Reports that she woke up Tuesday night with pain, took a tramadol, then on Wednesday developed nausea and vomiting.  Reports that she did not have much on her stomach but was having dry heaves essentially all day.  Yesterday, she started to have diarrhea, had 3 episodes.  They were dark but not black or bloody.  Has had abdominal cramping, nausea and decreased appetite over the last month.  She has never had symptoms like this with her treatments before.  Has midline back pain that she attributes to degenerative disc disease.  Denies any new significant flank pain.  Her abdominal pain has been waxing and waning.  Her fever was 101.7 last night.  Denies cough, congestion, rash.  She has had just severe dysuria over the past few weeks.  Initially thought to be urinary tract infection, however was determined to likely be vaginal dryness.  The hematuria is new starting this week and mild.  Past Medical History:  Diagnosis Date   CKD (chronic kidney disease), stage III (HCC)    Deficiency anemia    B12 def.  treated with b12 injection and has received iron infusion's   GERD (gastroesophageal reflux disease)    History of radiation therapy 09/22/20-11/01/20   Cervix, IMRT    Dr. Antony Blackbird   History of radiation therapy  11/09/2020-12/08/2020   cervix, intracavitary brachytherapy   Dr Antony Blackbird   Hypercholesteremia    Hypertension    Hypomagnesemia    Malignant neoplasm cervix [facility]) oncologist--- dr gorsuch/  radiation oncology--- dr Roselind Messier   dx 12/ 2021,  Stage IIIC-1,  with pelvic adeopathy;  concurrent chemo/ radiation;  chemo started 09-20-2020 and pt completed IMRT 11-01-2020 / scheduled for high dose brachytherapy to start 11-09-2020   PCOS (polycystic ovarian syndrome)    PMB (postmenopausal bleeding)    Type 2 diabetes mellitus (HCC)    followed by pc   (11-04-2020 pt stated does not check blood sugar)   Wears contact lenses      Home Medications Prior to Admission medications   Medication Sig Start Date End Date Taking? Authorizing Provider  amLODipine (NORVASC) 10 MG tablet Take 1 tablet (10 mg total) by mouth daily. 01/06/23   Simonne Martinet, NP  atenolol (TENORMIN) 100 MG tablet Take 1 tablet (100 mg total) by mouth daily. Take in the morning 05/22/23   Artis Delay, MD  calcium carbonate (TUMS - DOSED IN MG ELEMENTAL CALCIUM) 500 MG chewable tablet Chew 1 tablet by mouth daily.    [provider]  cholecalciferol (VITAMIN D3) 25 MCG (1000 UNIT) tablet Take 2,000 Units by mouth daily.    [provider]  diphenhydrAMINE (BENADRYL ALLERGY) 25 MG tablet Take 2 tablets (50 mg total) by mouth every 6 (six) hours  as needed for allergies. 01/05/23   Simonne Martinet, NP  ezetimibe (ZETIA) 10 MG tablet Take 10 mg by mouth in the morning.    [provider]  famotidine (PEPCID) 20 MG tablet Take 1 tablet (20 mg total) by mouth daily for 5 days. 01/05/23 01/10/23  Simonne Martinet, NP  glimepiride (AMARYL) 1 MG tablet Take 1 mg by mouth daily with breakfast. 07/07/22   [provider]  lidocaine-prilocaine (EMLA) cream Apply 1 Application topically as needed (for port access). 06/12/23   Artis Delay, MD  losartan (COZAAR) 100 MG tablet Take 1 tablet (100 mg total) by mouth  every evening. 05/22/23   Artis Delay, MD  losartan (COZAAR) 50 MG tablet TAKE 1 TABLET(50 MG) BY MOUTH EVERY EVENING 06/12/23   Bertis Ruddy, Ni, MD  ondansetron (ZOFRAN) 8 MG tablet Take 1 tablet (8 mg total) by mouth every 8 (eight) hours as needed for nausea or vomiting. Start on the third day after chemotherapy. 09/01/22   Artis Delay, MD  prochlorperazine (COMPAZINE) 10 MG tablet Take 1 tablet (10 mg total) by mouth every 6 (six) hours as needed for nausea or vomiting. 09/01/22   Artis Delay, MD  rosuvastatin (CRESTOR) 40 MG tablet Take 40 mg by mouth at bedtime.    [provider]  sitaGLIPtin (JANUVIA) 100 MG tablet Take 50 mg by mouth in the morning. 03/30/22   [provider]  traMADol (ULTRAM) 50 MG tablet Take 2 tablets (100 mg total) by mouth every 6 (six) hours as needed. 07/03/23   Artis Delay, MD  TYLENOL 500 MG tablet Take 500-1,000 mg by mouth every 6 (six) hours as needed for mild pain or headache.    [provider]  vitamin B-12 (CYANOCOBALAMIN) 1000 MCG tablet Take 1,000 mcg by mouth in the morning.    [provider]      Allergies    Lisinopril    Review of Systems   Review of Systems  Physical Exam Updated Vital Signs BP (!) 146/59 (BP Location: Right Arm)   Pulse (!) 123   Temp 100.2 F (37.9 C) (Oral)   Resp 20   SpO2 96%  Physical Exam Vitals and nursing note reviewed.  Constitutional:      General: She is not in acute distress.    Appearance: She is well-developed. She is not diaphoretic.  HENT:     Head: Normocephalic and atraumatic.  Eyes:     Conjunctiva/sclera: Conjunctivae normal.  Cardiovascular:     Rate and Rhythm: Regular rhythm. Tachycardia present.     Heart sounds: Normal heart sounds. No murmur heard.    No friction rub. No gallop.  Pulmonary:     Effort: Pulmonary effort is normal. No respiratory distress.     Breath sounds: Normal breath sounds. No wheezing or rales.  Abdominal:     General: There is no  distension.     Palpations: Abdomen is soft.     Tenderness: There is abdominal tenderness. There is no guarding.  Musculoskeletal:        General: No tenderness.     Cervical back: Normal range of motion.  Skin:    General: Skin is warm and dry.     Findings: No erythema or rash.  Neurological:     Mental Status: She is alert and oriented to person, place, and time.     ED Results / Procedures / Treatments   Labs (all labs ordered are listed, but only abnormal results  are displayed) Labs Reviewed  LACTIC ACID, PLASMA - Abnormal; Notable for the following components:      Result Value   Lactic Acid, Venous 2.5 (*)    All other components within normal limits  COMPREHENSIVE METABOLIC PANEL - Abnormal; Notable for the following components:   Sodium 131 (*)    Potassium 3.0 (*)    Chloride 97 (*)    Glucose, Bld 138 (*)    BUN 7 (*)    Creatinine, Ser 1.05 (*)    GFR, Estimated 57 (*)    All other components within normal limits  CBC WITH DIFFERENTIAL/PLATELET - Abnormal; Notable for the following components:   RBC 3.22 (*)    Hemoglobin 10.5 (*)    HCT 30.6 (*)    All other components within normal limits  URINALYSIS, W/ REFLEX TO CULTURE (INFECTION SUSPECTED) - Abnormal; Notable for the following components:   APPearance CLOUDY (*)    Hgb urine dipstick LARGE (*)    Ketones, ur 15 (*)    Protein, ur 100 (*)    Leukocytes,Ua LARGE (*)    All other components within normal limits  RESP PANEL BY RT-PCR (RSV, FLU A&B, COVID)  RVPGX2  CULTURE, BLOOD (ROUTINE X 2)  CULTURE, BLOOD (ROUTINE X 2)  URINE CULTURE  GASTROINTESTINAL PANEL BY PCR, STOOL (REPLACES STOOL CULTURE)  C DIFFICILE QUICK SCREEN W PCR REFLEX    LACTIC ACID, PLASMA  PROTIME-INR  APTT    EKG EKG Interpretation Date/Time:  Friday July 06 2023 11:59:26 EDT Ventricular Rate:  119 PR Interval:  197 QRS Duration:  89 QT Interval:  342 QTC Calculation: 482 R Axis:   85  Text Interpretation: Sinus  tachycardia Borderline right axis deviation Abnormal T, consider ischemia, diffuse leads Nonspecific TW changes likely rate related Confirmed by Alvira Monday ([id number]) on 07/06/2023 12:09:31 PM  Radiology CT ABDOMEN PELVIS W CONTRAST  Result Date: 07/06/2023 CLINICAL DATA:  Left lower quadrant pain. Fever, nausea, vomiting and diarrhea. History of cervical cancer with lung metastases. EXAM: CT ABDOMEN AND PELVIS WITH CONTRAST TECHNIQUE: Multidetector CT imaging of the abdomen and pelvis was performed using the standard protocol following bolus administration of intravenous contrast. RADIATION DOSE REDUCTION: This exam was performed according to the departmental dose-optimization program which includes automated exposure control, adjustment of the mA and/or kV according to patient size and/or use of iterative reconstruction technique. CONTRAST:  OMNIPAQUE IOHEXOL 300 MG/ML  SOLN COMPARISON:  CT 05/17/2023 and older. FINDINGS: Lower chest: Lung bases are grossly clear. No pleural effusion. Trace pericardial fluid. Hepatobiliary: Fatty liver infiltration. No space-occupying liver lesion. Hepatic granulomas. Patent portal vein. Gallbladder is distended. Pancreas: Unremarkable. No pancreatic ductal dilatation or surrounding inflammatory changes. Spleen: Splenic granulomas are identified. Small splenule anteriorly and in the hilum. Adrenals/Urinary Tract: Adrenal glands are preserved. No left-sided enhancing renal mass or collecting system dilatation. The left ureter has a normal course and caliber down to the bladder. The bladder is contracted with some wall thickening and stranding. Please correlate for any clinical evidence of cystitis. Bifid left renal collecting system. The previous 7 mm stone in the right renal pelvis is now in the lower pole of the right kidney. Second nonobstructing upper pole stone is stable. However there is new moderate right renal collecting system dilatation with increasing  perinephric stranding. There is some urothelial thickening as well along the course of the right renal pelvis and ureter. The ureter does slowly taper to a more normal caliber towards the UVJ.  No distal ureteral stone. Please correlate for any signs of urothelial infection. Stomach/Bowel: Stomach is relatively decompressed. The small bowel is nondilated. Large bowel has a normal course and caliber with some scattered colonic diverticula particularly of the sigmoid colon. There is wall thickening seen in the area of the sigmoid colon with the adjacent stranding and thickening of the mesorectal fascia. Vascular/Lymphatic: Aortic atherosclerosis. No enlarged abdominal or pelvic lymph nodes. Reproductive: Uterus is present. No adnexal mass. Increasing soft tissue stranding. Other: No free air.  No developing ascites. Musculoskeletal: Scattered degenerative changes of the spine and pelvis. Stable compression of the inferior endplate of L5 with sclerosis. IMPRESSION: Interval development of ectasia of the right renal collecting system with urothelial thickening and stranding. There are some nonobstructing intrarenal stones but no ureteral stones. Please correlate for any known history including for a urothelial infection. The urinary bladder is also thickened and there is some adjacent stranding. Please correlate for any suggestion a developing stricture. Overall with appearance would recommend follow up to confirm resolution and exclude secondary pathology. Increasing wall thickening along the rectum with perirectal space stranding and thickening of the mesorectal fascia. Please correlate for known history of radiation. 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 Electronically Signed   By: Karen Kays M.D.   On: 07/06/2023 14:43   DG Chest Port 1 View  Result Date: 07/06/2023 CLINICAL DATA:  Questionable sepsis - evaluate for abnormality. Abdominal pain. EXAM: PORTABLE CHEST 1 VIEW COMPARISON:  08/24/2022. FINDINGS:  Bilateral lung fields are clear. Again seen is elevated right hemidiaphragm. Bilateral costophrenic angles are clear. Normal cardio-mediastinal silhouette. No acute osseous abnormalities. The soft tissues are within normal limits. Right-sided CT Port-A-Cath is seen with its tip overlying the upper portion of superior vena cava. IMPRESSION: *No active disease. Electronically Signed   By: Jules Schick M.D.   On: 07/06/2023 14:15    Procedures Procedures    Medications Ordered in ED Medications  lactated ringers infusion ( Intravenous Infusion Verify 07/06/23 1503)  cefTRIAXone (ROCEPHIN) 2 g in sodium chloride 0.9 % 100 mL IVPB (0 g Intravenous Stopped 07/06/23 1426)  lactated ringers bolus 1,000 mL (has no administration in time range)  potassium chloride SA (KLOR-CON M) CR tablet 40 mEq (has no administration in time range)  lactated ringers bolus 1,000 mL (0 mLs Intravenous Stopped 07/06/23 1426)  iohexol (OMNIPAQUE) 300 MG/ML solution 100 mL (100 mLs Intravenous Contrast Given 07/06/23 1310)  acetaminophen (TYLENOL) tablet 1,000 mg (1,000 mg Oral Given 07/06/23 1500)    ED Course/ Medical Decision Making/ A&P   {     72 year old female with a history of cervical cancer on chemotherapy, hypertension who presents with concern for abdominal pain, nausea, vomiting, diarrhea, dysuria, and fever starting last night.  Differential diagnosis includes viral gastroenteritis, diverticulitis, nephrolithiasis, pyelonephritis, cholecystitis, appendicitis.  On arrival to the emergency department, is tachycardic, with some borderline blood pressures, temperature of 100.0.  Ordered 1 L of IV fluids, empiric Rocephin with concern for possible sepsis secondary to a urinary source.  Labs are completed and personally read interpreted by me show mild hypokalemia, similar renal function, no leukocytosis, consistent night anemia with prior.  INR within normal limits.  Initial lactic acid within normal  limits.  EKG completed and personally abided by me shows sinus tachycardia.  CXR evaluated by me without pneumonia.  Urinalysis returned and is consistent with a urinary tract infection.  Urine and blood cultures are pending at this time.  Did order stool studies given history  of diarrhea and immunocompromise.  CT abdomen pelvis was performed given abdominal pain and tenderness shows no evidence of diverticulitis, colitis, or other acute intra-abdominal surgical emergencies--does show kidney stones without any ureteral stones or obstruction, findings consistent with infection of ureters and bladder.  Given initial IV fluids.  She does have persistent tachycardia.  Ordered antipyretic, potassium.  Admitted to the hospitalist for continued care of sepsis secondary to urinary source.        Final Clinical Impression(s) / ED Diagnoses Final diagnoses:  Acute pyelonephritis  Sepsis without acute organ dysfunction, due to unspecified organism (HCC)  Hypokalemia  Tachycardia    Rx / DC Orders ED Discharge Orders     None        Alvira Monday, MD 07/06/23 1629

## 2023-07-06 NOTE — ED Notes (Signed)
Patient transported to CT 

## 2023-07-07 DIAGNOSIS — N390 Urinary tract infection, site not specified: Secondary | ICD-10-CM

## 2023-07-07 DIAGNOSIS — A419 Sepsis, unspecified organism: Secondary | ICD-10-CM | POA: Diagnosis not present

## 2023-07-07 DIAGNOSIS — A4150 Gram-negative sepsis, unspecified: Secondary | ICD-10-CM | POA: Diagnosis not present

## 2023-07-07 DIAGNOSIS — N10 Acute tubulo-interstitial nephritis: Secondary | ICD-10-CM | POA: Diagnosis not present

## 2023-07-07 DIAGNOSIS — E876 Hypokalemia: Secondary | ICD-10-CM | POA: Diagnosis not present

## 2023-07-07 LAB — TYPE AND SCREEN
ABO/RH(D): O POS
Antibody Screen: NEGATIVE

## 2023-07-07 LAB — BASIC METABOLIC PANEL
Anion gap: 6 (ref 5–15)
BUN: 6 mg/dL — ABNORMAL LOW (ref 8–23)
CO2: 23 mmol/L (ref 22–32)
Calcium: 8.1 mg/dL — ABNORMAL LOW (ref 8.9–10.3)
Chloride: 101 mmol/L (ref 98–111)
Creatinine, Ser: 0.99 mg/dL (ref 0.44–1.00)
GFR, Estimated: >60 mL/min (ref >60)
Glucose, Bld: 159 mg/dL — ABNORMAL HIGH (ref 70–99)
Potassium: 4 mmol/L (ref 3.5–5.1)
Sodium: 130 mmol/L — ABNORMAL LOW (ref 135–145)

## 2023-07-07 LAB — CBC WITH DIFFERENTIAL/PLATELET
Abs Immature Granulocytes: 0.02 10*3/uL (ref 0.00–0.07)
Basophils Absolute: 0 10*3/uL (ref 0.0–0.1)
Basophils Relative: 0 %
Eosinophils Absolute: 0.3 10*3/uL (ref 0.0–0.5)
Eosinophils Relative: 7 %
HCT: 28.3 % — ABNORMAL LOW (ref 36.0–46.0)
Hemoglobin: 9.2 g/dL — ABNORMAL LOW (ref 12.0–15.0)
Immature Granulocytes: 0 %
Lymphocytes Relative: 12 %
Lymphs Abs: 0.5 10*3/uL — ABNORMAL LOW (ref 0.7–4.0)
MCH: 32.4 pg (ref 26.0–34.0)
MCHC: 32.5 g/dL (ref 30.0–36.0)
MCV: 99.6 fL (ref 80.0–100.0)
Monocytes Absolute: 0.6 10*3/uL (ref 0.1–1.0)
Monocytes Relative: 14 %
Neutro Abs: 3.1 10*3/uL (ref 1.7–7.7)
Neutrophils Relative %: 67 %
Platelets: 168 10*3/uL (ref 150–400)
RBC: 2.84 MIL/uL — ABNORMAL LOW (ref 3.87–5.11)
RDW: 15.7 % — ABNORMAL HIGH (ref 11.5–15.5)
WBC: 4.6 10*3/uL (ref 4.0–10.5)
nRBC: 0 % (ref 0.0–0.2)

## 2023-07-07 LAB — LACTIC ACID, PLASMA
Lactic Acid, Venous: 1.3 mmol/L (ref 0.5–1.9)
Lactic Acid, Venous: 1 mmol/L (ref 0.5–1.9)

## 2023-07-07 LAB — GLUCOSE, CAPILLARY
Glucose-Capillary: 100 mg/dL — ABNORMAL HIGH (ref 70–99)
Glucose-Capillary: 143 mg/dL — ABNORMAL HIGH (ref 70–99)
Glucose-Capillary: 111 mg/dL — ABNORMAL HIGH (ref 70–99)
Glucose-Capillary: 125 mg/dL — ABNORMAL HIGH (ref 70–99)

## 2023-07-07 MED ORDER — ATENOLOL 50 MG PO TABS: 100.0000 mg | ORAL_TABLET | Freq: Every day | ORAL | Status: DC

## 2023-07-07 MED ORDER — SODIUM CHLORIDE 0.9 % IV SOLN: INTRAVENOUS | Status: AC

## 2023-07-07 MED ORDER — ATENOLOL 50 MG PO TABS
100.0000 mg | ORAL_TABLET | Freq: Every day | ORAL | Status: DC
  Administered 2023-07-07 – 2023-07-11 (×5): 100 mg via ORAL
  Filled 2023-07-07 (×5): qty 2

## 2023-07-07 MED ORDER — PANTOPRAZOLE SODIUM 40 MG PO TBEC: 40.0000 mg | DELAYED_RELEASE_TABLET | Freq: Two times a day (BID) | ORAL | Status: DC

## 2023-07-07 MED ORDER — LACTATED RINGERS IV BOLUS
500.0000 mL | Freq: Once | INTRAVENOUS | Status: AC
Start: 2023-07-07 — End: 2023-07-07
  Administered 2023-07-07: 500 mL via INTRAVENOUS

## 2023-07-07 MED ORDER — POTASSIUM CHLORIDE CRYS ER 20 MEQ PO TBCR
40.0000 meq | EXTENDED_RELEASE_TABLET | Freq: Two times a day (BID) | ORAL | Status: DC
  Filled 2023-07-07: qty 2

## 2023-07-07 MED ORDER — TRAMADOL HCL 50 MG PO TABS
50.0000 mg | ORAL_TABLET | Freq: Four times a day (QID) | ORAL | Status: DC | PRN
  Administered 2023-07-08: 50 mg via ORAL
  Filled 2023-07-07 (×2): qty 1

## 2023-07-07 MED ORDER — POTASSIUM CHLORIDE 20 MEQ PO PACK
40.0000 meq | PACK | Freq: Two times a day (BID) | ORAL | Status: AC
  Administered 2023-07-07 (×2): 40 meq via ORAL
  Filled 2023-07-07 (×2): qty 2

## 2023-07-07 MED ORDER — ACETAMINOPHEN 325 MG PO TABS
650.0000 mg | ORAL_TABLET | Freq: Four times a day (QID) | ORAL | Status: DC | PRN
  Administered 2023-07-07 – 2023-07-10 (×5): 650 mg via ORAL
  Filled 2023-07-07 (×5): qty 2

## 2023-07-07 MED ORDER — METOPROLOL TARTRATE 5 MG/5ML IV SOLN: 2.5000 mg | INTRAVENOUS | Status: DC | PRN

## 2023-07-07 MED ORDER — PANTOPRAZOLE SODIUM 40 MG IV SOLR
40.0000 mg | Freq: Two times a day (BID) | INTRAVENOUS | Status: DC
  Administered 2023-07-07 – 2023-07-09 (×4): 40 mg via INTRAVENOUS
  Filled 2023-07-07 (×4): qty 10

## 2023-07-07 MED ORDER — ONDANSETRON HCL 4 MG/2ML IJ SOLN
4.0000 mg | Freq: Four times a day (QID) | INTRAMUSCULAR | Status: DC | PRN
  Administered 2023-07-07 (×2): 4 mg via INTRAVENOUS
  Filled 2023-07-07 (×2): qty 2

## 2023-07-07 NOTE — Progress Notes (Signed)
[facility] PROGRESS NOTE    Progress Note  Misty KENWARD  Deleon:[id number] DOB: [birth year] DOA: 07/06/2023 PCP: Marletta Lor, NP     Brief Narrative:   Misty Deleon is an 72 y.o. 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  Having suprapubic pressure was found to be febrile at 101.7    Assessment/Plan:   Severe sepsis due to gram-negative UTI [facility]) CT scan of the abdomen pelvis nephrolithiasis with no obstruction.  Thickening gallbladder wall and UA compatible with an infection.  And radiation proctitis. Her urine culture might be unreliable as she was previously on antibiotics. Continue on aggressive IV fluids IV antibiotics. Blood cultures and urine cultures have been sent. Tmax of 102.9.  Lactic acidosis has resolved.  Chronic kidney disease stage IIIb: Creatinine remained his baseline.  History of cervical cancer [facility]) With a history of radiation therapy currently on immunotherapy.  Diabetes mellitus type 2 without complications: Glucose fairly controlled, continue sliding scale insulin.  Hypokalemia: Replete orally recheck in the morning.  Essential hypertension: Resume atenolol continue to hold Norvasc and losartan.  Sinus tach: Likely due to sepsis, fever and being off her atenolol. Use Tylenol for fever restart her atenolol and continue IV fluids.  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 Stool studies were sent due to her diarrhea. She was also started on sucralfate enemas.   DVT prophylaxis: lovenox Family Communication:none Status is: Inpatient Remains inpatient appropriate because: Severe sepsis due to UTI    Code Status:  Code Status History     Date Active Date Inactive Code Status Order ID Comments User Context   01/04/2023 1247 01/05/2023 2341 Full Code [id number]  Simonne Martinet, NP ED   08/24/2022 0944 08/25/2022 0514 Full Code [id number]  Mir, Al Corpus, MD HOV   12/08/2020 0551 12/08/2020 1413 Full Code [id number]  Antony Blackbird, MD Inpatient   11/25/2020 0649 11/27/2020 0520 Full Code [id number]  Antony Blackbird, MD Inpatient   11/16/2020 0540 11/16/2020 1440 Full Code [id number]  Antony Blackbird, MD Inpatient   11/09/2020 0700 11/09/2020 1550 Full Code [id number]  Antony Blackbird, MD Inpatient    Questions for Most Recent Historical Code Status (Order [id number])     Question Answer   By: Default: patient does not have capacity for decision making, no surrogate or prior directive available                Advance Directive Documentation    Flowsheet Row Most Recent Value  Type of Advance Directive --  [Pt unsure of type]  Pre-existing out of facility DNR order (yellow form or pink MOST form) --  "MOST" Form in Place? --         IV Access:   Peripheral IV   Procedures and diagnostic studies:   CT ABDOMEN PELVIS W CONTRAST  Result Date: 07/06/2023 CLINICAL DATA:  Left lower quadrant pain. Fever, nausea, vomiting and diarrhea. History of cervical cancer with lung metastases. EXAM: CT ABDOMEN AND PELVIS  WITH CONTRAST TECHNIQUE: Multidetector CT imaging of the abdomen and pelvis was performed using the standard protocol following bolus administration of intravenous contrast. RADIATION DOSE REDUCTION: This exam was performed according to the departmental dose-optimization program which includes automated exposure control,  adjustment of the mA and/or kV according to patient size and/or use of iterative reconstruction technique. CONTRAST:  OMNIPAQUE IOHEXOL 300 MG/ML  SOLN COMPARISON:  CT 05/17/2023 and older. FINDINGS: Lower chest: Lung bases are grossly clear. No pleural effusion. Trace pericardial fluid. Hepatobiliary: Fatty liver infiltration. No space-occupying liver lesion. Hepatic granulomas. Patent portal vein. Gallbladder is distended. Pancreas: Unremarkable. No pancreatic ductal dilatation or surrounding inflammatory changes. Spleen: Splenic granulomas are identified. Small splenule anteriorly and in the hilum. Adrenals/Urinary Tract: Adrenal glands are preserved. No left-sided enhancing renal mass or collecting system dilatation. The left ureter has a normal course and caliber down to the bladder. The bladder is contracted with some wall thickening and stranding. Please correlate for any clinical evidence of cystitis. Bifid left renal collecting system. The previous 7 mm stone in the right renal pelvis is now in the lower pole of the right kidney. Second nonobstructing upper pole stone is stable. However there is new moderate right renal collecting system dilatation with increasing perinephric stranding. There is some urothelial thickening as well along the course of the right renal pelvis and ureter. The ureter does slowly taper to a more normal caliber towards the UVJ. No distal ureteral stone. Please correlate for any signs of urothelial infection. Stomach/Bowel: Stomach is relatively decompressed. The small bowel is nondilated. Large bowel has a normal course and caliber with some scattered colonic diverticula particularly of the sigmoid colon. There is wall thickening seen in the area of the sigmoid colon with the adjacent stranding and thickening of the mesorectal fascia. Vascular/Lymphatic: Aortic atherosclerosis. No enlarged abdominal or pelvic lymph nodes. Reproductive: Uterus is present. No adnexal mass.  Increasing soft tissue stranding. Other: No free air.  No developing ascites. Musculoskeletal: Scattered degenerative changes of the spine and pelvis. Stable compression of the inferior endplate of L5 with sclerosis. IMPRESSION: Interval development of ectasia of the right renal collecting system with urothelial thickening and stranding. There are some nonobstructing intrarenal stones but no ureteral stones. Please correlate for any known history including for a urothelial infection. The urinary bladder is also thickened and there is some adjacent stranding. Please correlate for any suggestion a developing stricture. Overall with appearance would recommend follow up to confirm resolution and exclude secondary pathology. Increasing wall thickening along the rectum with perirectal space stranding and thickening of the mesorectal fascia. Please correlate for known history of radiation. 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 Electronically Signed   By: Karen Kays M.D.   On: 07/06/2023 14:43   DG Chest Port 1 View  Result Date: 07/06/2023 CLINICAL DATA:  Questionable sepsis - evaluate for abnormality. Abdominal pain. EXAM: PORTABLE CHEST 1 VIEW COMPARISON:  08/24/2022. FINDINGS: Bilateral lung fields are clear. Again seen is elevated right hemidiaphragm. Bilateral costophrenic angles are clear. Normal cardio-mediastinal silhouette. No acute osseous abnormalities. The soft tissues are within normal limits. Right-sided CT Port-A-Cath is seen with its tip overlying the upper portion of superior vena cava. IMPRESSION: *No active disease. Electronically Signed   By: Jules Schick M.D.   On: 07/06/2023 14:15     Medical Consultants:   None.   Subjective:    [name] dysuria has resolved.  Objective:    Vitals:   07/07/23 0337 07/07/23 0500 07/07/23  4782 07/07/23 0650  BP: 134/71   116/77  Pulse: (!) 123   (!) 148  Resp:   13 17  Temp: (!) 102.9 F (39.4 C)   100 F (37.8 C)  TempSrc:  Oral   Oral  SpO2: 95%   97%  Height:  5\' 2"  (1.575 m)     SpO2: 97 %   Intake/Output Summary (Last 24 hours) at 07/07/2023 9562 Last data filed at 07/07/2023 0230 Gross per 24 hour  Intake 1100.76 ml  Output 200 ml  Net 900.76 ml   There were no vitals filed for this visit.  Exam: General exam: In no acute distress. Respiratory system: Good air movement and clear to auscultation. Cardiovascular system: S1 & S2 heard, RRR. No JVD. Gastrointestinal system: Abdomen is nondistended, soft and nontender.  Extremities: No pedal edema. Skin: No rashes, lesions or ulcers Psychiatry: Judgement and insight appear normal. Mood & affect appropriate.    Data Reviewed:    Labs: Basic Metabolic Panel: Recent Labs  Lab 07/03/23 1030 07/06/23 1208  NA 136 131*  K 3.3* 3.0*  CL 103 97*  CO2 26 22  GLUCOSE 143* 138*  BUN 11 7*  CREATININE 1.18* 1.05*  CALCIUM 9.3 9.2   GFR Estimated Creatinine Clearance: 51.4 mL/min (A) (by C-G formula based on SCr of 1.05 mg/dL (H)). Liver Function Tests: Recent Labs  Lab 07/03/23 1030 07/06/23 1208  AST 18 19  ALT 9 7  ALKPHOS 46 50  BILITOT 0.8 1.1  PROT 6.6 6.6  ALBUMIN 3.5 3.5   No results for input(s): "LIPASE", "AMYLASE" in the last 168 hours. No results for input(s): "AMMONIA" in the last 168 hours. Coagulation profile Recent Labs  Lab 07/06/23 1243  INR 1.2   COVID-19 Labs  No results for input(s): "DDIMER", "FERRITIN", "LDH", "CRP" in the last 72 hours.  Lab Results  Component Value Date   SARSCOV2NAA NEGATIVE 07/06/2023   SARSCOV2NAA POSITIVE (A) 05/27/2021   SARSCOV2NAA NEGATIVE 12/07/2020   SARSCOV2NAA NEGATIVE 11/27/2020    CBC: Recent Labs  Lab 07/03/23 1030 07/06/23 1208  WBC 4.7 4.7  NEUTROABS 2.9 3.2  HGB 10.4* 10.5*  HCT 30.8* 30.6*  MCV 98.4 95.0  PLT 179 157   Cardiac Enzymes: No results for input(s): "CKTOTAL", "CKMB", "CKMBINDEX", "TROPONINI" in the last 168 hours. BNP (last 3  results) No results for input(s): "PROBNP" in the last 8760 hours. CBG: No results for input(s): "GLUCAP" in the last 168 hours. D-Dimer: No results for input(s): "DDIMER" in the last 72 hours. Hgb A1c: No results for input(s): "HGBA1C" in the last 72 hours. Lipid Profile: No results for input(s): "CHOL", "HDL", "LDLCALC", "TRIG", "CHOLHDL", "LDLDIRECT" in the last 72 hours. Thyroid function studies: No results for input(s): "TSH", "T4TOTAL", "T3FREE", "THYROIDAB" in the last 72 hours.  Invalid input(s): "FREET3" Anemia work up: No results for input(s): "VITAMINB12", "FOLATE", "FERRITIN", "TIBC", "IRON", "RETICCTPCT" in the last 72 hours. Sepsis Labs: Recent Labs  Lab 07/03/23 1030 07/06/23 1208 07/06/23 1243 07/06/23 1437 07/07/23 0058 07/07/23 0430  WBC 4.7 4.7  --   --   --   --   LATICACIDVEN  --   --  1.4 2.5* 1.3 1.0   Microbiology Recent Results (from the past 240 hour(s))  Resp panel by RT-PCR (RSV, Flu A&B, Covid) Anterior Nasal Swab     Status: None   Collection Time: 07/06/23 12:43 PM   Specimen: Anterior Nasal Swab  Result Value Ref Range Status   SARS Coronavirus  2 by RT PCR NEGATIVE NEGATIVE Final    Comment: (NOTE) SARS-CoV-2 target nucleic acids are NOT DETECTED.  The SARS-CoV-2 RNA is generally detectable in upper respiratory specimens during the acute phase of infection. The lowest concentration of SARS-CoV-2 viral copies this assay can detect is 138 copies/mL. A negative result does not preclude SARS-Cov-2 infection and should not be used as the sole basis for treatment or other patient management decisions. A negative result may occur with  improper specimen collection/handling, submission of specimen other than nasopharyngeal swab, presence of viral mutation(s) within the areas targeted by this assay, and inadequate number of viral copies(<138 copies/mL). A negative result must be combined with clinical observations, patient history, and  epidemiological information. The expected result is Negative.  Fact Sheet for Patients:  BloggerCourse.com  Fact Sheet for Healthcare Providers:  SeriousBroker.it  This test is no t yet approved or cleared by the [place] FDA and  has been authorized for detection and/or diagnosis of SARS-CoV-2 by FDA under an Emergency Use Authorization (EUA). This EUA will remain  in effect (meaning this test can be used) for the duration of the COVID-19 declaration under Section 564(b)(1) of the Act, 21 U.S.C.section 360bbb-3(b)(1), unless the authorization is terminated  or revoked sooner.       Influenza A by PCR NEGATIVE NEGATIVE Final   Influenza B by PCR NEGATIVE NEGATIVE Final    Comment: (NOTE) The Xpert Xpress SARS-CoV-2/FLU/RSV plus assay is intended as an aid in the diagnosis of influenza from Nasopharyngeal swab specimens and should not be used as a sole basis for treatment. Nasal washings and aspirates are unacceptable for Xpert Xpress SARS-CoV-2/FLU/RSV testing.  Fact Sheet for Patients: BloggerCourse.com  Fact Sheet for Healthcare Providers: SeriousBroker.it  This test is not yet approved or cleared by the [place] FDA and has been authorized for detection and/or diagnosis of SARS-CoV-2 by FDA under an Emergency Use Authorization (EUA). This EUA will remain in effect (meaning this test can be used) for the duration of the COVID-19 declaration under Section 564(b)(1) of the Act, 21 U.S.C. section 360bbb-3(b)(1), unless the authorization is terminated or revoked.     Resp Syncytial Virus by PCR NEGATIVE NEGATIVE Final    Comment: (NOTE) Fact Sheet for Patients: BloggerCourse.com  Fact Sheet for Healthcare Providers: SeriousBroker.it  This test is not yet approved or cleared by the [place] FDA and has been  authorized for detection and/or diagnosis of SARS-CoV-2 by FDA under an Emergency Use Authorization (EUA). This EUA will remain in effect (meaning this test can be used) for the duration of the COVID-19 declaration under Section 564(b)(1) of the Act, 21 U.S.C. section 360bbb-3(b)(1), unless the authorization is terminated or revoked.  Performed at [facility], [address] [place], [place] [address]      Medications:    Chlorhexidine Gluconate Cloth  6 each Topical Daily   insulin aspart  0-9 Units Subcutaneous TID WC   pantoprazole (PROTONIX) IV  40 mg Intravenous Q12H   rosuvastatin  40 mg Oral QHS   sodium chloride flush  10-40 mL Intracatheter Q12H   sucralfate  2 g Rectal BID   Continuous Infusions:  cefTRIAXone (ROCEPHIN)  IV Stopped (07/06/23 1405)   lactated ringers     lactated ringers 150 mL/hr at 07/07/23 0025      LOS: 1 day   Marinda Elk  [facility]  07/07/2023, 7:18 AM

## 2023-07-07 NOTE — Progress Notes (Signed)
Mobility Specialist - Progress Note   07/07/23 1446  Mobility  Activity Ambulated with assistance in hallway  Level of Assistance Minimal assist, patient does 75% or more  Assistive Device Other (Comment) (IV Pole)  Distance Ambulated (ft) 190 ft  Range of Motion/Exercises Active  Activity Response Tolerated fair  Mobility Referral Yes  $Mobility charge 1 Mobility  Mobility Specialist Start Time (ACUTE ONLY) 1430  Mobility Specialist Stop Time (ACUTE ONLY) 1446  Mobility Specialist Time Calculation (min) (ACUTE ONLY) 16 min   Pt was found in bed and agreeable to ambulate. Was min-A for bed mobility and SB for STS and ambulation. Grew fatigued with session. At EOS returned to recliner chair with all needs met. Call bell in reach and husband in room.  Billey Chang Mobility Specialist

## 2023-07-08 DIAGNOSIS — N10 Acute tubulo-interstitial nephritis: Secondary | ICD-10-CM

## 2023-07-08 DIAGNOSIS — A419 Sepsis, unspecified organism: Secondary | ICD-10-CM | POA: Diagnosis not present

## 2023-07-08 DIAGNOSIS — N390 Urinary tract infection, site not specified: Secondary | ICD-10-CM

## 2023-07-08 DIAGNOSIS — R000 Tachycardia, unspecified: Secondary | ICD-10-CM

## 2023-07-08 DIAGNOSIS — A4150 Gram-negative sepsis, unspecified: Secondary | ICD-10-CM | POA: Diagnosis not present

## 2023-07-08 DIAGNOSIS — E876 Hypokalemia: Secondary | ICD-10-CM

## 2023-07-08 LAB — URINE CULTURE: Culture: 10000 — AB

## 2023-07-08 LAB — GLUCOSE, CAPILLARY
Glucose-Capillary: 84 mg/dL (ref 70–99)
Glucose-Capillary: 95 mg/dL (ref 70–99)
Glucose-Capillary: 87 mg/dL (ref 70–99)
Glucose-Capillary: 105 mg/dL — ABNORMAL HIGH (ref 70–99)

## 2023-07-08 MED ORDER — SODIUM CHLORIDE 0.9 % IV SOLN: 12.5000 mg | Freq: Four times a day (QID) | INTRAVENOUS | Status: DC | PRN

## 2023-07-08 MED ORDER — ORAL CARE MOUTH RINSE: 15.0000 mL | OROMUCOSAL | Status: DC | PRN

## 2023-07-08 MED ORDER — SODIUM CHLORIDE 0.9 % IV SOLN
1.0000 g | Freq: Two times a day (BID) | INTRAVENOUS | Status: DC
  Administered 2023-07-08 – 2023-07-09 (×3): 1 g via INTRAVENOUS
  Filled 2023-07-08 (×3): qty 10

## 2023-07-08 MED ORDER — SODIUM CHLORIDE 0.9 % IV SOLN
8.0000 mg | Freq: Four times a day (QID) | INTRAVENOUS | Status: DC | PRN
  Filled 2023-07-08: qty 4

## 2023-07-08 MED ORDER — METOCLOPRAMIDE HCL 5 MG/ML IJ SOLN
5.0000 mg | Freq: Three times a day (TID) | INTRAMUSCULAR | Status: AC
  Administered 2023-07-08 – 2023-07-09 (×3): 5 mg via INTRAVENOUS
  Filled 2023-07-08 (×3): qty 2

## 2023-07-08 NOTE — Progress Notes (Signed)
PT Cancellation Note  Patient Details Name: Misty Deleon MRN: [id number] DOB: Jan 04, 1951   Cancelled Treatment:    Reason Eval/Treat Not Completed: Other (comment); per RN pt just received enema, requests PT attempt again at later time. Will continue efforts to complete eval as able   [facility] 07/08/2023, 12:19 PM

## 2023-07-08 NOTE — Progress Notes (Signed)
[facility] PROGRESS NOTE    Progress Note  Misty Deleon  WUJ:[id number] DOB: 15-May-1951 DOA: 07/06/2023 PCP: Marletta Lor, NP     Brief Narrative:   Misty Deleon is an 72 y.o. 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  Having suprapubic pressure was found to be febrile at 101.7   Assessment/Plan:   Severe sepsis due to gram-negative UTI [facility]) CT scan of the abdomen pelvis nephrolithiasis with no obstruction and radiation proctitis. Thickening gallbladder wall and UA compatible with an infection.   Urine culture grew E. coli sensitive to Ancef. She is still spiking fevers but trending down her temperature this morning was 100.6, she continues to complain of burning when she urinates, will transition her to IV cefepime Blood cultures remain negative till date.  Chronic kidney disease stage IIIb: Creatinine remained his baseline.  History of cervical cancer [facility]) With a history of radiation therapy currently on immunotherapy.  Diabetes mellitus type 2 without complications: Glucose fairly controlled, continue sliding scale insulin.  Hypokalemia: Replete orally recheck in the morning.  Essential hypertension: Blood pressure is controlled continue atenolol, continue to hold Norvasc and ARB.  Sinus tach: With IV fluids and restarting her atenolol.  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 Will start her on sucralfate enemas.   DVT prophylaxis: lovenox Family Communication:none Status is: Inpatient Remains inpatient appropriate because: Severe sepsis due to UTI    Code Status:  Code Status History     Date Active Date Inactive Code Status Order ID Comments User Context   01/04/2023 1247 01/05/2023 2341 Full Code [id number]  Simonne Martinet, NP ED   08/24/2022 0944 08/25/2022 0514 Full Code [id number]  Mir, Al Corpus, MD HOV   12/08/2020 0551 12/08/2020 1413 Full Code [id number]  Antony Blackbird, MD Inpatient   11/25/2020 0649 11/27/2020 0520 Full Code [id number]  Antony Blackbird, MD Inpatient   11/16/2020 0540 11/16/2020 1440 Full Code [id number]  Antony Blackbird, MD Inpatient   11/09/2020 0700 11/09/2020 1550 Full Code [id number]  Antony Blackbird, MD Inpatient    Questions for Most Recent Historical Code Status (Order [id number])     Question Answer   By: Default: patient does not have capacity for decision making, no surrogate or prior directive available                Advance Directive Documentation    Flowsheet Row Most Recent Value  Type of Advance Directive --  [Pt unsure of type]  Pre-existing out of facility DNR order (yellow form or pink MOST form) --  "MOST" Form in Place? --         IV Access:   Peripheral IV   Procedures and diagnostic studies:   CT ABDOMEN PELVIS W CONTRAST  Result Date: 07/06/2023 CLINICAL DATA:  Left lower quadrant pain. Fever, nausea, vomiting and diarrhea. History of cervical cancer with lung metastases. EXAM: CT ABDOMEN AND PELVIS WITH CONTRAST TECHNIQUE: Multidetector CT imaging of the abdomen and pelvis was  performed using the standard protocol following bolus administration of intravenous contrast. RADIATION DOSE REDUCTION: This exam was performed according to the departmental dose-optimization program which includes automated exposure control, adjustment of the mA and/or kV according to patient size and/or use of  iterative reconstruction technique. CONTRAST:  OMNIPAQUE IOHEXOL 300 MG/ML  SOLN COMPARISON:  CT 05/17/2023 and older. FINDINGS: Lower chest: Lung bases are grossly clear. No pleural effusion. Trace pericardial fluid. Hepatobiliary: Fatty liver infiltration. No space-occupying liver lesion. Hepatic granulomas. Patent portal vein. Gallbladder is distended. Pancreas: Unremarkable. No pancreatic ductal dilatation or surrounding inflammatory changes. Spleen: Splenic granulomas are identified. Small splenule anteriorly and in the hilum. Adrenals/Urinary Tract: Adrenal glands are preserved. No left-sided enhancing renal mass or collecting system dilatation. The left ureter has a normal course and caliber down to the bladder. The bladder is contracted with some wall thickening and stranding. Please correlate for any clinical evidence of cystitis. Bifid left renal collecting system. The previous 7 mm stone in the right renal pelvis is now in the lower pole of the right kidney. Second nonobstructing upper pole stone is stable. However there is new moderate right renal collecting system dilatation with increasing perinephric stranding. There is some urothelial thickening as well along the course of the right renal pelvis and ureter. The ureter does slowly taper to a more normal caliber towards the UVJ. No distal ureteral stone. Please correlate for any signs of urothelial infection. Stomach/Bowel: Stomach is relatively decompressed. The small bowel is nondilated. Large bowel has a normal course and caliber with some scattered colonic diverticula particularly of the sigmoid colon. There is wall thickening seen in the area of the sigmoid colon with the adjacent stranding and thickening of the mesorectal fascia. Vascular/Lymphatic: Aortic atherosclerosis. No enlarged abdominal or pelvic lymph nodes. Reproductive: Uterus is present. No adnexal mass. Increasing soft tissue stranding. Other: No free air.  No developing  ascites. Musculoskeletal: Scattered degenerative changes of the spine and pelvis. Stable compression of the inferior endplate of L5 with sclerosis. IMPRESSION: Interval development of ectasia of the right renal collecting system with urothelial thickening and stranding. There are some nonobstructing intrarenal stones but no ureteral stones. Please correlate for any known history including for a urothelial infection. The urinary bladder is also thickened and there is some adjacent stranding. Please correlate for any suggestion a developing stricture. Overall with appearance would recommend follow up to confirm resolution and exclude secondary pathology. Increasing wall thickening along the rectum with perirectal space stranding and thickening of the mesorectal fascia. Please correlate for known history of radiation. 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 Electronically Signed   By: Karen Kays M.D.   On: 07/06/2023 14:43   DG Chest Port 1 View  Result Date: 07/06/2023 CLINICAL DATA:  Questionable sepsis - evaluate for abnormality. Abdominal pain. EXAM: PORTABLE CHEST 1 VIEW COMPARISON:  08/24/2022. FINDINGS: Bilateral lung fields are clear. Again seen is elevated right hemidiaphragm. Bilateral costophrenic angles are clear. Normal cardio-mediastinal silhouette. No acute osseous abnormalities. The soft tissues are within normal limits. Right-sided CT Port-A-Cath is seen with its tip overlying the upper portion of superior vena cava. IMPRESSION: *No active disease. Electronically Signed   By: Jules Schick M.D.   On: 07/06/2023 14:15     Medical Consultants:   None.   Subjective:    Misty Deleon continues to have dysuria and diarrhea.  Objective:    Vitals:   07/07/23 1559 07/07/23 1829 07/07/23 2112 07/08/23 0429  BP: (!) 114/59   Marland Kitchen)  110/57  Pulse: 73 76 74 81  Resp: 19 20  17   Temp: 99.8 F (37.7 C) 99.1 F (37.3 C) 99.5 F (37.5 C) (!) 100.6 F (38.1 C)  TempSrc: Oral Oral Oral  Oral  SpO2: 97% 94%  90%  Height:       SpO2: 90 %   Intake/Output Summary (Last 24 hours) at 07/08/2023 0847 Last data filed at 07/08/2023 0801 Gross per 24 hour  Intake 2239.53 ml  Output 1550 ml  Net 689.53 ml   There were no vitals filed for this visit.  Exam: General exam: In no acute distress. Respiratory system: Good air movement and clear to auscultation. Cardiovascular system: S1 & S2 heard, RRR. No JVD. Gastrointestinal system: Abdomen is nondistended, soft and nontender.  Extremities: No pedal edema. Skin: No rashes, lesions or ulcers Psychiatry: Judgement and insight appear normal. Mood & affect appropriate.  Data Reviewed:    Labs: Basic Metabolic Panel: Recent Labs  Lab 07/03/23 1030 07/06/23 1208 07/07/23 1135  NA 136 131* 130*  K 3.3* 3.0* 4.0  CL 103 97* 101  CO2 26 22 23   GLUCOSE 143* 138* 159*  BUN 11 7* 6*  CREATININE 1.18* 1.05* 0.99  CALCIUM 9.3 9.2 8.1*   GFR Estimated Creatinine Clearance: 54.6 mL/min (by C-G formula based on SCr of 0.99 mg/dL). Liver Function Tests: Recent Labs  Lab 07/03/23 1030 07/06/23 1208  AST 18 19  ALT 9 7  ALKPHOS 46 50  BILITOT 0.8 1.1  PROT 6.6 6.6  ALBUMIN 3.5 3.5   No results for input(s): "LIPASE", "AMYLASE" in the last 168 hours. No results for input(s): "AMMONIA" in the last 168 hours. Coagulation profile Recent Labs  Lab 07/06/23 1243  INR 1.2   COVID-19 Labs  No results for input(s): "DDIMER", "FERRITIN", "LDH", "CRP" in the last 72 hours.  Lab Results  Component Value Date   SARSCOV2NAA NEGATIVE 07/06/2023   SARSCOV2NAA POSITIVE (A) 05/27/2021   SARSCOV2NAA NEGATIVE 12/07/2020   SARSCOV2NAA NEGATIVE 11/27/2020    CBC: Recent Labs  Lab 07/03/23 1030 07/06/23 1208 07/07/23 1135  WBC 4.7 4.7 4.6  NEUTROABS 2.9 3.2 3.1  HGB 10.4* 10.5* 9.2*  HCT 30.8* 30.6* 28.3*  MCV 98.4 95.0 99.6  PLT 179 157 168   Cardiac Enzymes: No results for input(s): "CKTOTAL", "CKMB",  "CKMBINDEX", "TROPONINI" in the last 168 hours. BNP (last 3 results) No results for input(s): "PROBNP" in the last 8760 hours. CBG: Recent Labs  Lab 07/07/23 0814 07/07/23 1150 07/07/23 1558 07/07/23 2109 07/08/23 0733  GLUCAP 100* 143* 111* 125* 84   D-Dimer: No results for input(s): "DDIMER" in the last 72 hours. Hgb A1c: No results for input(s): "HGBA1C" in the last 72 hours. Lipid Profile: No results for input(s): "CHOL", "HDL", "LDLCALC", "TRIG", "CHOLHDL", "LDLDIRECT" in the last 72 hours. Thyroid function studies: No results for input(s): "TSH", "T4TOTAL", "T3FREE", "THYROIDAB" in the last 72 hours.  Invalid input(s): "FREET3" Anemia work up: No results for input(s): "VITAMINB12", "FOLATE", "FERRITIN", "TIBC", "IRON", "RETICCTPCT" in the last 72 hours. Sepsis Labs: Recent Labs  Lab 07/03/23 1030 07/06/23 1208 07/06/23 1243 07/06/23 1437 07/07/23 0058 07/07/23 0430 07/07/23 1135  WBC 4.7 4.7  --   --   --   --  4.6  LATICACIDVEN  --   --  1.4 2.5* 1.3 1.0  --    Microbiology Recent Results (from the past 240 hour(s))  Blood Culture (routine x 2)     Status: None (Preliminary result)  Collection Time: 07/06/23 12:08 PM   Specimen: BLOOD  Result Value Ref Range Status   Specimen Description   Final    BLOOD PORTA CATH Performed at [facility], [address] [place], [place] [address]    Special Requests   Final    Blood Culture results may not be optimal due to an excessive volume of blood received in culture bottles BOTTLES DRAWN AEROBIC AND ANAEROBIC Performed at [facility], [address], [place], [place] [address]    Culture   Final    NO GROWTH 2 DAYS Performed at [facility], [address] [place], [place] [address]    Report Status PENDING  Incomplete  Urine Culture     Status: Abnormal   Collection Time: 07/06/23 12:09 PM   Specimen: Urine, Random  Result Value Ref Range Status   Specimen  Description   Final    URINE, RANDOM Performed at [facility], [address], [place], [place] [address]    Special Requests   Final    NONE Reflexed from [id number] Performed at [facility], [address], [place], [place] [address]    Culture 10,000 COLONIES/mL ESCHERICHIA COLI (A)  Final   Report Status 07/08/2023 FINAL  Final   Organism ID, Bacteria ESCHERICHIA COLI (A)  Final      Susceptibility   Escherichia coli - MIC*    AMPICILLIN >=32 RESISTANT Resistant     CEFAZOLIN 8 SENSITIVE Sensitive     CEFEPIME <=0.12 SENSITIVE Sensitive     CEFTRIAXONE <=0.25 SENSITIVE Sensitive     CIPROFLOXACIN >=4 RESISTANT Resistant     GENTAMICIN <=1 SENSITIVE Sensitive     IMIPENEM <=0.25 SENSITIVE Sensitive     NITROFURANTOIN <=16 SENSITIVE Sensitive     TRIMETH/SULFA >=320 RESISTANT Resistant     AMPICILLIN/SULBACTAM >=32 RESISTANT Resistant     PIP/TAZO 64 INTERMEDIATE Intermediate ug/mL    * 10,000 COLONIES/mL ESCHERICHIA COLI  Blood Culture (routine x 2)     Status: None (Preliminary result)   Collection Time: 07/06/23 12:13 PM   Specimen: BLOOD  Result Value Ref Range Status   Specimen Description   Final    BLOOD BLOOD LEFT ARM Performed at [facility], [address] [place], [place] [address]    Special Requests   Final    Blood Culture adequate volume BOTTLES DRAWN AEROBIC AND ANAEROBIC Performed at [facility], [address], [place], [place] [address]    Culture   Final    NO GROWTH 2 DAYS Performed at [facility], [address], [place], [place] [address]    Report Status PENDING  Incomplete  Resp panel by RT-PCR (RSV, Flu A&B, Covid) Anterior Nasal Swab     Status: None   Collection Time: 07/06/23 12:43 PM   Specimen: Anterior Nasal Swab  Result Value Ref Range Status   SARS Coronavirus 2 by RT PCR NEGATIVE NEGATIVE Final    Comment: (NOTE) SARS-CoV-2 target nucleic acids  are NOT DETECTED.  The SARS-CoV-2 RNA is generally detectable in upper respiratory specimens during the acute phase of infection. The lowest concentration of SARS-CoV-2 viral copies this assay can detect is 138 copies/mL. A negative result does not preclude SARS-Cov-2 infection and should not be used as the sole basis for treatment or other patient management decisions. A negative result may occur with  improper specimen collection/handling, submission of specimen other than nasopharyngeal swab, presence of viral mutation(s) within the  areas targeted by this assay, and inadequate number of viral copies(<138 copies/mL). A negative result must be combined with clinical observations, patient history, and epidemiological information. The expected result is Negative.  Fact Sheet for Patients:  BloggerCourse.com  Fact Sheet for Healthcare Providers:  SeriousBroker.it  This test is no t yet approved or cleared by the [place] FDA and  has been authorized for detection and/or diagnosis of SARS-CoV-2 by FDA under an Emergency Use Authorization (EUA). This EUA will remain  in effect (meaning this test can be used) for the duration of the COVID-19 declaration under Section 564(b)(1) of the Act, 21 U.S.C.section 360bbb-3(b)(1), unless the authorization is terminated  or revoked sooner.       Influenza A by PCR NEGATIVE NEGATIVE Final   Influenza B by PCR NEGATIVE NEGATIVE Final    Comment: (NOTE) The Xpert Xpress SARS-CoV-2/FLU/RSV plus assay is intended as an aid in the diagnosis of influenza from Nasopharyngeal swab specimens and should not be used as a sole basis for treatment. Nasal washings and aspirates are unacceptable for Xpert Xpress SARS-CoV-2/FLU/RSV testing.  Fact Sheet for Patients: BloggerCourse.com  Fact Sheet for Healthcare Providers: SeriousBroker.it  This test is  not yet approved or cleared by the [place] FDA and has been authorized for detection and/or diagnosis of SARS-CoV-2 by FDA under an Emergency Use Authorization (EUA). This EUA will remain in effect (meaning this test can be used) for the duration of the COVID-19 declaration under Section 564(b)(1) of the Act, 21 U.S.C. section 360bbb-3(b)(1), unless the authorization is terminated or revoked.     Resp Syncytial Virus by PCR NEGATIVE NEGATIVE Final    Comment: (NOTE) Fact Sheet for Patients: BloggerCourse.com  Fact Sheet for Healthcare Providers: SeriousBroker.it  This test is not yet approved or cleared by the [place] FDA and has been authorized for detection and/or diagnosis of SARS-CoV-2 by FDA under an Emergency Use Authorization (EUA). This EUA will remain in effect (meaning this test can be used) for the duration of the COVID-19 declaration under Section 564(b)(1) of the Act, 21 U.S.C. section 360bbb-3(b)(1), unless the authorization is terminated or revoked.  Performed at [facility], [address], [place], [place] [address]      Medications:    atenolol  100 mg Oral Daily   Chlorhexidine Gluconate Cloth  6 each Topical Daily   insulin aspart  0-9 Units Subcutaneous TID WC   pantoprazole (PROTONIX) IV  40 mg Intravenous Q12H   rosuvastatin  40 mg Oral QHS   sodium chloride flush  10-40 mL Intracatheter Q12H   sucralfate  2 g Rectal BID   Continuous Infusions:  cefTRIAXone (ROCEPHIN)  IV Stopped (07/07/23 1320)      LOS: 2 days   Marinda Elk  [facility]  07/08/2023, 8:47 AM

## 2023-07-08 NOTE — Evaluation (Addendum)
Physical Therapy Evaluation and Discharge Patient Details Name: Misty Deleon MRN: [id number] DOB: 1950-12-25 Today's Date: 07/08/2023  History of Present Illness  72 y.o. female recently diagnosed with a UTI and treated with Augmentin by her oncologist with negative cultures, despite this she continues to have dysuria, seen at the ED was found to have a low blood pressure and tachycardia; pt admitted with dsepsis d/t UTI. 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 All education has been completed and the patient has no further questions.  Pt very pleasant and agreeable to PT, amb ~ 180' with no device to IV pole push, no LOB. Pt with decr joint excursion throughout bil LEs and reports this is her baseline gait. Encouraged pt to continue to mobilize as much as possible   See below for any follow-up Physical Therapy or equipment needs. PT is signing off. Thank you for this referral.         If plan is discharge home, recommend the following: Help with stairs or ramp for entrance   Can travel by private vehicle        Equipment Recommendations None recommended by PT  Recommendations for Other Services       Functional Status Assessment Patient has not had a recent decline in their functional status     Precautions / Restrictions Precautions Precautions: None Restrictions Weight Bearing Restrictions: No      Mobility  Bed Mobility               General bed mobility comments: pt in recliner    Transfers Overall transfer level: Needs assistance   Transfers: Sit to/from Stand Sit to Stand: Supervision, Modified independent (Device/Increase time)           General transfer comment: for safety, lines    Ambulation/Gait Ambulation/Gait assistance: Supervision,  Modified independent (Device/Increase time) Gait Distance (Feet): 180 Feet Assistive device: None, IV Pole Gait Pattern/deviations: Step-through pattern, Wide base of support       General Gait Details: slow but steady gait without overt LOB, decr hip/knee flexion during gait - pt reports this is her baseline  Careers information officer     Tilt Bed    Modified Rankin (Stroke Patients Only)       Balance Overall balance assessment: Mild deficits observed, not formally tested                                           Pertinent Vitals/Pain Pain Assessment Pain Assessment: No/denies pain    Home Living Family/patient expects to be discharged to:: Private residence Living Arrangements: Spouse/significant other Available Help at Discharge: Family             Home Equipment: None Additional Comments: dtr is a HHPT    Prior Function Prior Level of Function : Independent/Modified Independent                     Extremity/Trunk Assessment   Upper Extremity Assessment Upper Extremity Assessment: Overall WFL for tasks assessed    Lower Extremity Assessment Lower Extremity Assessment: Overall WFL for tasks assessed       Communication  Cognition Arousal: Alert Behavior During Therapy: WFL for tasks assessed/performed Overall Cognitive Status: Within Functional Limits for tasks assessed                                          General Comments      Exercises     Assessment/Plan    PT Assessment Patient does not need any further PT services  PT Problem List         PT Treatment Interventions      PT Goals (Current goals can be found in the Care Plan section)  Acute Rehab PT Goals PT Goal Formulation: All assessment and education complete, DC therapy    Frequency       Co-evaluation               AM-PAC PT "6 Clicks" Mobility  Outcome Measure Help needed turning from your  back to your side while in a flat bed without using bedrails?: A Little Help needed moving from lying on your back to sitting on the side of a flat bed without using bedrails?: A Little Help needed moving to and from a bed to a chair (including a wheelchair)?: A Little Help needed standing up from a chair using your arms (e.g., wheelchair or bedside chair)?: A Little Help needed to walk in hospital room?: A Little Help needed climbing 3-5 steps with a railing? : A Little 6 Click Score: 18    End of Session Equipment Utilized During Treatment: Gait belt Activity Tolerance: Patient tolerated treatment well Patient left: in chair;with call bell/phone within reach;with family/visitor present   PT Visit Diagnosis: Other abnormalities of gait and mobility (R26.89)    Time: 4540-9811 PT Time Calculation (min) (ACUTE ONLY): 18 min   Charges:   PT Evaluation $PT Eval Low Complexity: 1 Low   PT General Charges $$ ACUTE PT VISIT: 1 Visit         Rashae Rother, PT  Acute Rehab Dept ([facility]) [phone]  07/08/2023   [facility] 07/08/2023, 3:29 PM

## 2023-07-09 DIAGNOSIS — A419 Sepsis, unspecified organism: Secondary | ICD-10-CM | POA: Diagnosis not present

## 2023-07-09 DIAGNOSIS — A4150 Gram-negative sepsis, unspecified: Secondary | ICD-10-CM | POA: Diagnosis not present

## 2023-07-09 DIAGNOSIS — N390 Urinary tract infection, site not specified: Secondary | ICD-10-CM | POA: Diagnosis not present

## 2023-07-09 DIAGNOSIS — N10 Acute tubulo-interstitial nephritis: Secondary | ICD-10-CM | POA: Diagnosis not present

## 2023-07-09 DIAGNOSIS — E876 Hypokalemia: Secondary | ICD-10-CM | POA: Diagnosis not present

## 2023-07-09 LAB — BASIC METABOLIC PANEL
Anion gap: 6 (ref 5–15)
BUN: 7 mg/dL — ABNORMAL LOW (ref 8–23)
CO2: 23 mmol/L (ref 22–32)
Calcium: 7.5 mg/dL — ABNORMAL LOW (ref 8.9–10.3)
Chloride: 106 mmol/L (ref 98–111)
Creatinine, Ser: 1.24 mg/dL — ABNORMAL HIGH (ref 0.44–1.00)
GFR, Estimated: 47 mL/min — ABNORMAL LOW (ref >60)
Glucose, Bld: 111 mg/dL — ABNORMAL HIGH (ref 70–99)
Potassium: 3.4 mmol/L — ABNORMAL LOW (ref 3.5–5.1)
Sodium: 135 mmol/L (ref 135–145)

## 2023-07-09 MED ORDER — PSYLLIUM 95 % PO PACK
1.0000 | PACK | Freq: Every day | ORAL | Status: DC
  Administered 2023-07-09: 1 via ORAL
  Filled 2023-07-09 (×3): qty 1

## 2023-07-09 MED ORDER — CEPHALEXIN 500 MG PO CAPS
500.0000 mg | ORAL_CAPSULE | Freq: Two times a day (BID) | ORAL | Status: DC
  Administered 2023-07-09 – 2023-07-11 (×4): 500 mg via ORAL
  Filled 2023-07-09 (×4): qty 1

## 2023-07-09 MED ORDER — DIPHENOXYLATE-ATROPINE 2.5-0.025 MG PO TABS
2.0000 | ORAL_TABLET | Freq: Four times a day (QID) | ORAL | Status: DC | PRN
  Administered 2023-07-09 (×2): 2 via ORAL
  Filled 2023-07-09 (×3): qty 2

## 2023-07-09 MED ORDER — PANTOPRAZOLE SODIUM 40 MG PO TBEC
40.0000 mg | DELAYED_RELEASE_TABLET | Freq: Two times a day (BID) | ORAL | Status: DC
  Administered 2023-07-09 – 2023-07-11 (×4): 40 mg via ORAL
  Filled 2023-07-09 (×4): qty 1

## 2023-07-09 NOTE — Consult Note (Signed)
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, on pemprolizumab, recent 4 weeks prior to admission onset of dysuria not improved with macrobid x2, admitted 10/25 for fever and ongoing dysuria along with gross hematuria  Bcx negative Flu/rsv/covid negative  Sx does overlap with uti syndrome although with her radiation therapy and checkpoint-inhibitor immune therapy we query if an autoimmune process relate cystitis is present along with 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 She only grew 10k of non-esbl ecoli this admission.   She was given augmentin a week prior to admission but had urine testing done at oncology clinic which doesn't grow anything so told to stop abx. Sx ongoing at that time  Ct this admission does suggest thickened bladder wall with stranding, intrarenal nonobstructing stone, and perirectal wall thickening/stranding. Again she did have radiation to pelvis area for cervical cancer  She reports resolved gross hematuria and improved dysuria with ceftriaxone/cefepime. The fever is ongoing on HD#3 though  Will continue treatment targetting what is growing. If fever and sx resolved in 2 more days we should look for non-infectious causes of cystitis  She reports stable chronic diarrhea intermittently. No other localizing symptomatology  There is some degree of dehydration due to nausea/vomiting related to tramadol use?   Plan: Transition to  oral cephalexin today In 2 more days if continued uti sx or fever, consider discussing case with urology/oncology regarding possible immune check point related side effect and non-id causes cystitis workup We can try to treat her for 7 more days if her sx continues to improve, for a 10 day course Discussed with primary team      ------------------------------------------------ Principal Problem:   Sepsis due to gram-negative UTI (HCC) Active Problems:   Cervical cancer (HCC)   Chronic kidney disease (CKD), stage III (moderate) (HCC)   Diabetes mellitus without complication (HCC)   Hypomagnesemia   Essential hypertension   Severe sepsis (HCC)   Radiation proctitis   Diarrhea    HPI: Misty Deleon is a 72 y.o. 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 She only grew 10k of non-esbl ecoli this admission.   She was given augmentin a week prior to admission but had urine testing done at oncology clinic which doesn't grow anything so told to stop abx. Sx ongoing at  that time   She continues on her check point inhibitor treatment last given 07/03/23. She had cisplatin tx previously along with radiation She was stopped on vegf inhibitor due to proteinuria several weeks ago Recent chest ct 05/2023 showed stable pulm  nodules   After she stopped amox-clav she was given tramadol for dysuria but developed severe nausea/vomiting for  few days after starting to take it.   Due to ongoing sx and also new fever/rigor development she came for admission 10/25  Febrile on admission No leukocytosis Ua showed small LE, >50rbc, >50 wbc, large Hb  Said overall feeling better  However still have high fever earlier this morning 10/28 (hd #3)   Family History  Problem Relation Age of Onset   Diabetes Father    Brain cancer Son    Breast cancer Neg Hx    Colon cancer Neg Hx    Ovarian cancer Neg Hx    Endometrial cancer Neg Hx    Prostate cancer Neg Hx    Pancreatic cancer Neg Hx     Social History   Tobacco Use   Smoking status: Never   Smokeless tobacco: Never  Vaping Use   Vaping status: Never Used  Substance Use Topics   Alcohol use: Never   Drug use: Never    Allergies  Allergen Reactions   Lisinopril Swelling and Other (See Comments)    Entire tongue became swollen and the exterior front of the throat. No shortness of breath noted.    Review of Systems: ROS All Other ROS was negative, except mentioned above   Past Medical History:  Diagnosis Date   CKD (chronic kidney disease), stage III (HCC)    Deficiency anemia    B12 def.  treated with b12 injection and has received iron infusion's   GERD (gastroesophageal reflux disease)    History of radiation therapy 09/22/20-11/01/20   Cervix, IMRT    Dr. Antony Blackbird   History of radiation therapy 11/09/2020-12/08/2020   cervix, intracavitary brachytherapy   Dr Antony Blackbird   Hypercholesteremia    Hypertension    Hypomagnesemia    Malignant neoplasm cervix [facility]) oncologist--- dr gorsuch/  radiation oncology--- dr Roselind Messier   dx 12/ 2021,  Stage IIIC-1,  with pelvic adeopathy;  concurrent chemo/ radiation;  chemo started 09-20-2020 and pt completed IMRT 11-01-2020 / scheduled for high dose brachytherapy to start 11-09-2020   PCOS (polycystic  ovarian syndrome)    PMB (postmenopausal bleeding)    Type 2 diabetes mellitus (HCC)    followed by pc   (11-04-2020 pt stated does not check blood sugar)   Wears contact lenses        Scheduled Meds:  atenolol  100 mg Oral Daily   cephALEXin  500 mg Oral Q12H   Chlorhexidine Gluconate Cloth  6 each Topical Daily   insulin aspart  0-9 Units Subcutaneous TID WC   pantoprazole  40 mg Oral BID   psyllium  1 packet Oral Daily   rosuvastatin  40 mg Oral QHS   sodium chloride flush  10-40 mL Intracatheter Q12H   sucralfate  2 g Rectal BID   Continuous Infusions:  ondansetron (ZOFRAN) IV     promethazine (PHENERGAN) injection (IM or IVPB)     PRN Meds:.acetaminophen, hydrALAZINE, metoprolol tartrate, ondansetron (ZOFRAN) IV, mouth rinse, promethazine (PHENERGAN) injection (IM or IVPB), sodium chloride flush, traMADol   OBJECTIVE: Blood pressure (!) 123/57, pulse 71, temperature 97.6 F (36.4 C), temperature source Axillary, resp. rate 19, height  5\' 2"  (1.575 m), weight 90 kg, SpO2 96%.  Physical Exam General/constitutional: no distress, pleasant HEENT: Normocephalic, PER, Conj Clear, EOMI, Oropharynx clear Neck supple CV: rrr no mrg Lungs: clear to auscultation, normal respiratory effort Abd: Soft, Nontender Ext: no edema Skin: No Rash Neuro: nonfocal MSK: no peripheral joint swelling/tenderness/warmth; back spines nontender  Central line: right chest port site no tenderness/erythema   Lab Results Lab Results  Component Value Date   WBC 4.6 07/07/2023   HGB 9.2 (L) 07/07/2023   HCT 28.3 (L) 07/07/2023   MCV 99.6 07/07/2023   PLT 168 07/07/2023    Lab Results  Component Value Date   CREATININE 0.99 07/07/2023   BUN 6 (L) 07/07/2023   NA 130 (L) 07/07/2023   K 4.0 07/07/2023   CL 101 07/07/2023   CO2 23 07/07/2023    Lab Results  Component Value Date   ALT 7 07/06/2023   AST 19 07/06/2023   ALKPHOS 50 07/06/2023   BILITOT 1.1 07/06/2023       Microbiology: Recent Results (from the past 240 hour(s))  Blood Culture (routine x 2)     Status: None (Preliminary result)   Collection Time: 07/06/23 12:08 PM   Specimen: BLOOD  Result Value Ref Range Status   Specimen Description   Final    BLOOD PORTA CATH Performed at [facility], [address] [place], [place] [address]    Special Requests   Final    Blood Culture results may not be optimal due to an excessive volume of blood received in culture bottles BOTTLES DRAWN AEROBIC AND ANAEROBIC Performed at [facility], [address], [place], [place] [address]    Culture   Final    NO GROWTH 3 DAYS Performed at [facility], [address] [place], [place] [address]    Report Status PENDING  Incomplete  Urine Culture     Status: Abnormal   Collection Time: 07/06/23 12:09 PM   Specimen: Urine, Random  Result Value Ref Range Status   Specimen Description   Final    URINE, RANDOM Performed at [facility], [address] [place], [place] [address]    Special Requests   Final    NONE Reflexed from [id number] Performed at [facility], [address] [place], [place] [address]    Culture 10,000 COLONIES/mL ESCHERICHIA COLI (A)  Final   Report Status 07/08/2023 FINAL  Final   Organism ID, Bacteria ESCHERICHIA COLI (A)  Final      Susceptibility   Escherichia coli - MIC*    AMPICILLIN >=32 RESISTANT Resistant     CEFAZOLIN 8 SENSITIVE Sensitive     CEFEPIME <=0.12 SENSITIVE Sensitive     CEFTRIAXONE <=0.25 SENSITIVE Sensitive     CIPROFLOXACIN >=4 RESISTANT Resistant     GENTAMICIN <=1 SENSITIVE Sensitive     IMIPENEM <=0.25 SENSITIVE Sensitive     NITROFURANTOIN <=16 SENSITIVE Sensitive     TRIMETH/SULFA >=320 RESISTANT Resistant     AMPICILLIN/SULBACTAM >=32 RESISTANT Resistant     PIP/TAZO 64 INTERMEDIATE Intermediate ug/mL    * 10,000 COLONIES/mL ESCHERICHIA COLI  Blood  Culture (routine x 2)     Status: None (Preliminary result)   Collection Time: 07/06/23 12:13 PM   Specimen: BLOOD  Result Value Ref Range Status   Specimen Description   Final    BLOOD BLOOD LEFT ARM Performed at [facility], [address] [place], [place] [address]    Special  Requests   Final    Blood Culture adequate volume BOTTLES DRAWN AEROBIC AND ANAEROBIC Performed at [facility], [address] [place], [place] [address]    Culture   Final    NO GROWTH 3 DAYS Performed at [facility], [address] [place], [place] [address]    Report Status PENDING  Incomplete  Resp panel by RT-PCR (RSV, Flu A&B, Covid) Anterior Nasal Swab     Status: None   Collection Time: 07/06/23 12:43 PM   Specimen: Anterior Nasal Swab  Result Value Ref Range Status   SARS Coronavirus 2 by RT PCR NEGATIVE NEGATIVE Final    Comment: (NOTE) SARS-CoV-2 target nucleic acids are NOT DETECTED.  The SARS-CoV-2 RNA is generally detectable in upper respiratory specimens during the acute phase of infection. The lowest concentration of SARS-CoV-2 viral copies this assay can detect is 138 copies/mL. A negative result does not preclude SARS-Cov-2 infection and should not be used as the sole basis for treatment or other patient management decisions. A negative result may occur with  improper specimen collection/handling, submission of specimen other than nasopharyngeal swab, presence of viral mutation(s) within the areas targeted by this assay, and inadequate number of viral copies(<138 copies/mL). A negative result must be combined with clinical observations, patient history, and epidemiological information. The expected result is Negative.  Fact Sheet for Patients:  BloggerCourse.com  Fact Sheet for Healthcare Providers:  SeriousBroker.it  This test is no t yet approved or cleared by the [place]  FDA and  has been authorized for detection and/or diagnosis of SARS-CoV-2 by FDA under an Emergency Use Authorization (EUA). This EUA will remain  in effect (meaning this test can be used) for the duration of the COVID-19 declaration under Section 564(b)(1) of the Act, 21 U.S.C.section 360bbb-3(b)(1), unless the authorization is terminated  or revoked sooner.       Influenza A by PCR NEGATIVE NEGATIVE Final   Influenza B by PCR NEGATIVE NEGATIVE Final    Comment: (NOTE) The Xpert Xpress SARS-CoV-2/FLU/RSV plus assay is intended as an aid in the diagnosis of influenza from Nasopharyngeal swab specimens and should not be used as a sole basis for treatment. Nasal washings and aspirates are unacceptable for Xpert Xpress SARS-CoV-2/FLU/RSV testing.  Fact Sheet for Patients: BloggerCourse.com  Fact Sheet for Healthcare Providers: SeriousBroker.it  This test is not yet approved or cleared by the [place] FDA and has been authorized for detection and/or diagnosis of SARS-CoV-2 by FDA under an Emergency Use Authorization (EUA). This EUA will remain in effect (meaning this test can be used) for the duration of the COVID-19 declaration under Section 564(b)(1) of the Act, 21 U.S.C. section 360bbb-3(b)(1), unless the authorization is terminated or revoked.     Resp Syncytial Virus by PCR NEGATIVE NEGATIVE Final    Comment: (NOTE) Fact Sheet for Patients: BloggerCourse.com  Fact Sheet for Healthcare Providers: SeriousBroker.it  This test is not yet approved or cleared by the [place] FDA and has been authorized for detection and/or diagnosis of SARS-CoV-2 by FDA under an Emergency Use Authorization (EUA). This EUA will remain in effect (meaning this test can be used) for the duration of the COVID-19 declaration under Section 564(b)(1) of the Act, 21 U.S.C. section  360bbb-3(b)(1), unless the authorization is terminated or revoked.  Performed at [facility], [address] [place], [place] [address]      Serology:    Imaging: If present, new imagings (plain films, ct scans, and  mri) have been personally visualized and interpreted; radiology reports have been reviewed. Decision making incorporated into the Impression / Recommendations.  10/25 abd pelv ct Interval development of ectasia of the right renal collecting system with urothelial thickening and stranding. There are some nonobstructing intrarenal stones but no ureteral stones. Please correlate for any known history including for a urothelial infection. The urinary bladder is also thickened and there is some adjacent stranding. Please correlate for any suggestion a developing stricture. Overall with appearance would recommend follow up to confirm resolution and exclude secondary pathology.   Increasing wall thickening along the rectum with perirectal space stranding and thickening of the mesorectal fascia. Please correlate for known history of radiation.   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  Raymondo Band, MD [facility] [phone] pager    07/09/2023, 12:10 PM

## 2023-07-09 NOTE — Plan of Care (Signed)
  Problem: Clinical Measurements: Goal: Will remain free from infection Outcome: Progressing Goal: Diagnostic test results will improve Outcome: Progressing   Problem: Activity: Goal: Risk for activity intolerance will decrease Outcome: Progressing   Problem: Nutrition: Goal: Adequate nutrition will be maintained Outcome: Progressing   Problem: Elimination: Goal: Will not experience complications related to bowel motility Outcome: Progressing Goal: Will not experience complications related to urinary retention Outcome: Progressing   Problem: Safety: Goal: Ability to remain free from injury will improve Outcome: Progressing   Problem: Education: Goal: Ability to describe self-care measures that may prevent or decrease complications (Diabetes Survival Skills Education) will improve Outcome: Progressing   Problem: Nutritional: Goal: Maintenance of adequate nutrition will improve Outcome: Progressing Goal: Progress toward achieving an optimal weight will improve Outcome: Progressing   Problem: Education: Goal: Knowledge of General Education information will improve Description: Including pain rating scale, medication(s)/side effects and non-pharmacologic comfort measures Outcome: Adequate for Discharge   Problem: Health Behavior/Discharge Planning: Goal: Ability to manage health-related needs will improve Outcome: Adequate for Discharge   Problem: Clinical Measurements: Goal: Respiratory complications will improve Outcome: Adequate for Discharge Goal: Cardiovascular complication will be avoided Outcome: Adequate for Discharge   Problem: Coping: Goal: Level of anxiety will decrease Outcome: Adequate for Discharge   Problem: Pain Management: Goal: General experience of comfort will improve Outcome: Adequate for Discharge   Problem: Skin Integrity: Goal: Risk for impaired skin integrity will decrease Outcome: Adequate for Discharge   Problem: Fluid Volume: Goal:  Ability to maintain a balanced intake and output will improve Outcome: Adequate for Discharge   Problem: Health Behavior/Discharge Planning: Goal: Ability to identify and utilize available resources and services will improve Outcome: Adequate for Discharge   Problem: Metabolic: Goal: Ability to maintain appropriate glucose levels will improve Outcome: Adequate for Discharge   Problem: Skin Integrity: Goal: Risk for impaired skin integrity will decrease Outcome: Adequate for Discharge   Problem: Tissue Perfusion: Goal: Adequacy of tissue perfusion will improve Outcome: Adequate for Discharge

## 2023-07-09 NOTE — Progress Notes (Signed)
[facility] PROGRESS NOTE    Progress Note  DIANEY BELARDE  OZD:[id number] DOB: Sep 22, 1950 DOA: 07/06/2023 PCP: Marletta Lor, NP     Brief Narrative:   Misty Deleon is an 72 y.o. 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  Having suprapubic pressure was found to be febrile at 101.7  Assessment/Plan:   Severe sepsis due to gram-negative UTI [facility]) CT scan of the abdomen pelvis nephrolithiasis with no obstruction and radiation proctitis. Thickening gallbladder wall and UA compatible with an infection.   Urine culture grew E. coli 10,000 colonies sensitive to Ancef. She continues to spike fever despite IV antibiotics, she does not have white count we will consult infectious disease for fever of unknown source. Question of this is due to malignancy Blood cultures remain negative till date.  Chronic kidney disease stage IIIb: Creatinine remained his baseline.  History of cervical cancer [facility]) With a history of radiation therapy currently on immunotherapy.  Diabetes mellitus type 2 without complications: Discontinue blood sugar checks she has required no insulin over several days she takes no oral hypoglycemic agents at home.  Hypokalemia: Replete orally recheck in the morning.  Essential hypertension: Blood pressure is controlled continue atenolol, continue to hold Norvasc and ARB.  Sinus tach: With IV fluids and restarting her  atenolol.  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 Will start her on sucralfate enemas.   DVT prophylaxis: lovenox Family Communication:none Status is: Inpatient Remains inpatient appropriate because: Severe sepsis due to UTI    Code Status:  Code Status History     Date Active Date Inactive Code Status Order ID Comments User Context   01/04/2023 1247 01/05/2023 2341 Full Code [id number]  Simonne Martinet, NP ED   08/24/2022 0944 08/25/2022 0514 Full Code [id number]  Mir, Al Corpus, MD HOV   12/08/2020 0551 12/08/2020 1413 Full Code [id number]  Antony Blackbird, MD Inpatient   11/25/2020 0649 11/27/2020 0520 Full Code [id number]  Antony Blackbird, MD Inpatient   11/16/2020 0540 11/16/2020 1440 Full Code [id number]  Antony Blackbird, MD Inpatient   11/09/2020 0700 11/09/2020 1550 Full Code [id number]  Antony Blackbird, MD Inpatient    Questions for Most Recent Historical Code Status (Order [id number])     Question Answer   By: Default: patient does not have capacity for decision making, no surrogate or prior directive available                Advance Directive Documentation    Flowsheet Row Most Recent Value  Type of Advance Directive --  [Pt unsure of type]  Pre-existing out of facility DNR order (yellow form or pink MOST form) --  "MOST" Form in Place? --         IV Access:   Peripheral IV   Procedures and diagnostic studies:   No results found.   Medical Consultants:   None.   Subjective:    SAORY VERONA continues to have dysuria.  Objective:    Vitals:  07/08/23 2300 07/09/23 0427 07/09/23 0625 07/09/23 0700  BP: 128/63 (!) 123/57    Pulse:  71    Resp:  18    Temp:  (!) 101.2 F (38.4 C) 100 F (37.8 C) 99.6 F (37.6 C)  TempSrc:  Oral Oral Oral  SpO2:  96%    Weight:      Height:       SpO2: 96 %   Intake/Output Summary (Last 24 hours) at 07/09/2023 0746 Last data filed at 07/09/2023  0300 Gross per 24 hour  Intake 920 ml  Output 677 ml  Net 243 ml   Filed Weights   07/08/23 1731  Weight: 90 kg    Exam: General exam: In no acute distress. Respiratory system: Good air movement and clear to auscultation. Cardiovascular system: S1 & S2 heard, RRR. No JVD. Gastrointestinal system: Abdomen is nondistended, soft and nontender.  Extremities: No pedal edema. Skin: No rashes, lesions or ulcers Psychiatry: Judgement and insight appear normal. Mood & affect appropriate.  Data Reviewed:    Labs: Basic Metabolic Panel: Recent Labs  Lab 07/03/23 1030 07/06/23 1208 07/07/23 1135  NA 136 131* 130*  K 3.3* 3.0* 4.0  CL 103 97* 101  CO2 26 22 23   GLUCOSE 143* 138* 159*  BUN 11 7* 6*  CREATININE 1.18* 1.05* 0.99  CALCIUM 9.3 9.2 8.1*   GFR Estimated Creatinine Clearance: 54.4 mL/min (by C-G formula based on SCr of 0.99 mg/dL). Liver Function Tests: Recent Labs  Lab 07/03/23 1030 07/06/23 1208  AST 18 19  ALT 9 7  ALKPHOS 46 50  BILITOT 0.8 1.1  PROT 6.6 6.6  ALBUMIN 3.5 3.5   No results for input(s): "LIPASE", "AMYLASE" in the last 168 hours. No results for input(s): "AMMONIA" in the last 168 hours. Coagulation profile Recent Labs  Lab 07/06/23 1243  INR 1.2   COVID-19 Labs  No results for input(s): "DDIMER", "FERRITIN", "LDH", "CRP" in the last 72 hours.  Lab Results  Component Value Date   SARSCOV2NAA NEGATIVE 07/06/2023   SARSCOV2NAA POSITIVE (A) 05/27/2021   SARSCOV2NAA NEGATIVE 12/07/2020   SARSCOV2NAA NEGATIVE 11/27/2020    CBC: Recent Labs  Lab 07/03/23 1030 07/06/23 1208 07/07/23 1135  WBC 4.7 4.7 4.6  NEUTROABS 2.9 3.2 3.1  HGB 10.4* 10.5* 9.2*  HCT 30.8* 30.6* 28.3*  MCV 98.4 95.0 99.6  PLT 179 157 168   Cardiac Enzymes: No results for input(s): "CKTOTAL", "CKMB", "CKMBINDEX", "TROPONINI" in the last 168 hours. BNP (last 3 results) No results for input(s): "PROBNP" in the last 8760 hours. CBG: Recent Labs  Lab  07/07/23 2109 07/08/23 0733 07/08/23 1156 07/08/23 1547 07/08/23 2131  GLUCAP 125* 84 95 87 105*   D-Dimer: No results for input(s): "DDIMER" in the last 72 hours. Hgb A1c: No results for input(s): "HGBA1C" in the last 72 hours. Lipid Profile: No results for input(s): "CHOL", "HDL", "LDLCALC", "TRIG", "CHOLHDL", "LDLDIRECT" in the last 72 hours. Thyroid function studies: No results for input(s): "TSH", "T4TOTAL", "T3FREE", "THYROIDAB" in the last 72 hours.  Invalid input(s): "FREET3" Anemia work up: No results for input(s): "VITAMINB12", "FOLATE", "FERRITIN", "TIBC", "IRON", "RETICCTPCT" in the last 72 hours. Sepsis Labs: Recent Labs  Lab 07/03/23 1030 07/06/23 1208 07/06/23 1243 07/06/23 1437 07/07/23 0058 07/07/23 0430 07/07/23 1135  WBC 4.7 4.7  --   --   --   --  4.6  LATICACIDVEN  --   --  1.4 2.5* 1.3 1.0  --    Microbiology Recent  Results (from the past 240 hour(s))  Blood Culture (routine x 2)     Status: None (Preliminary result)   Collection Time: 07/06/23 12:08 PM   Specimen: BLOOD  Result Value Ref Range Status   Specimen Description   Final    BLOOD PORTA CATH Performed at [facility], [address], [place], [place] [address]    Special Requests   Final    Blood Culture results may not be optimal due to an excessive volume of blood received in culture bottles BOTTLES DRAWN AEROBIC AND ANAEROBIC Performed at [facility], [address], [place], [place] [address]    Culture   Final    NO GROWTH 3 DAYS Performed at [facility], [address] [place], [place] [address]    Report Status PENDING  Incomplete  Urine Culture     Status: Abnormal   Collection Time: 07/06/23 12:09 PM   Specimen: Urine, Random  Result Value Ref Range Status   Specimen Description   Final    URINE, RANDOM Performed at [facility], [address] [place], [place] [address]    Special Requests   Final     NONE Reflexed from [id number] Performed at [facility], [address] [place], [place] [address]    Culture 10,000 COLONIES/mL ESCHERICHIA COLI (A)  Final   Report Status 07/08/2023 FINAL  Final   Organism ID, Bacteria ESCHERICHIA COLI (A)  Final      Susceptibility   Escherichia coli - MIC*    AMPICILLIN >=32 RESISTANT Resistant     CEFAZOLIN 8 SENSITIVE Sensitive     CEFEPIME <=0.12 SENSITIVE Sensitive     CEFTRIAXONE <=0.25 SENSITIVE Sensitive     CIPROFLOXACIN >=4 RESISTANT Resistant     GENTAMICIN <=1 SENSITIVE Sensitive     IMIPENEM <=0.25 SENSITIVE Sensitive     NITROFURANTOIN <=16 SENSITIVE Sensitive     TRIMETH/SULFA >=320 RESISTANT Resistant     AMPICILLIN/SULBACTAM >=32 RESISTANT Resistant     PIP/TAZO 64 INTERMEDIATE Intermediate ug/mL    * 10,000 COLONIES/mL ESCHERICHIA COLI  Blood Culture (routine x 2)     Status: None (Preliminary result)   Collection Time: 07/06/23 12:13 PM   Specimen: BLOOD  Result Value Ref Range Status   Specimen Description   Final    BLOOD BLOOD LEFT ARM Performed at [facility], [address], [place], [place] [address]    Special Requests   Final    Blood Culture adequate volume BOTTLES DRAWN AEROBIC AND ANAEROBIC Performed at [facility], [address], [place], [place] [address]    Culture   Final    NO GROWTH 3 DAYS Performed at [facility], [address] [place], [place] [address]    Report Status PENDING  Incomplete  Resp panel by RT-PCR (RSV, Flu A&B, Covid) Anterior Nasal Swab     Status: None   Collection Time: 07/06/23 12:43 PM   Specimen: Anterior Nasal Swab  Result Value Ref Range Status   SARS Coronavirus 2 by RT PCR NEGATIVE NEGATIVE Final    Comment: (NOTE) SARS-CoV-2 target nucleic acids are NOT DETECTED.  The SARS-CoV-2 RNA is generally detectable in upper respiratory specimens during the acute phase of infection. The lowest concentration  of SARS-CoV-2 viral copies this assay can detect is 138 copies/mL. A negative result does not preclude SARS-Cov-2 infection and should not be used as the sole basis for treatment or other patient management decisions. A  negative result may occur with  improper specimen collection/handling, submission of specimen other than nasopharyngeal swab, presence of viral mutation(s) within the areas targeted by this assay, and inadequate number of viral copies(<138 copies/mL). A negative result must be combined with clinical observations, patient history, and epidemiological information. The expected result is Negative.  Fact Sheet for Patients:  BloggerCourse.com  Fact Sheet for Healthcare Providers:  SeriousBroker.it  This test is no t yet approved or cleared by the [place] FDA and  has been authorized for detection and/or diagnosis of SARS-CoV-2 by FDA under an Emergency Use Authorization (EUA). This EUA will remain  in effect (meaning this test can be used) for the duration of the COVID-19 declaration under Section 564(b)(1) of the Act, 21 U.S.C.section 360bbb-3(b)(1), unless the authorization is terminated  or revoked sooner.       Influenza A by PCR NEGATIVE NEGATIVE Final   Influenza B by PCR NEGATIVE NEGATIVE Final    Comment: (NOTE) The Xpert Xpress SARS-CoV-2/FLU/RSV plus assay is intended as an aid in the diagnosis of influenza from Nasopharyngeal swab specimens and should not be used as a sole basis for treatment. Nasal washings and aspirates are unacceptable for Xpert Xpress SARS-CoV-2/FLU/RSV testing.  Fact Sheet for Patients: BloggerCourse.com  Fact Sheet for Healthcare Providers: SeriousBroker.it  This test is not yet approved or cleared by the [place] FDA and has been authorized for detection and/or diagnosis of SARS-CoV-2 by FDA under an Emergency Use  Authorization (EUA). This EUA will remain in effect (meaning this test can be used) for the duration of the COVID-19 declaration under Section 564(b)(1) of the Act, 21 U.S.C. section 360bbb-3(b)(1), unless the authorization is terminated or revoked.     Resp Syncytial Virus by PCR NEGATIVE NEGATIVE Final    Comment: (NOTE) Fact Sheet for Patients: BloggerCourse.com  Fact Sheet for Healthcare Providers: SeriousBroker.it  This test is not yet approved or cleared by the [place] FDA and has been authorized for detection and/or diagnosis of SARS-CoV-2 by FDA under an Emergency Use Authorization (EUA). This EUA will remain in effect (meaning this test can be used) for the duration of the COVID-19 declaration under Section 564(b)(1) of the Act, 21 U.S.C. section 360bbb-3(b)(1), unless the authorization is terminated or revoked.  Performed at [facility], [address] [place], [place] [address]      Medications:    atenolol  100 mg Oral Daily   Chlorhexidine Gluconate Cloth  6 each Topical Daily   insulin aspart  0-9 Units Subcutaneous TID WC   pantoprazole (PROTONIX) IV  40 mg Intravenous Q12H   rosuvastatin  40 mg Oral QHS   sodium chloride flush  10-40 mL Intracatheter Q12H   sucralfate  2 g Rectal BID   Continuous Infusions:  ceFEPime (MAXIPIME) IV 1 g (07/08/23 2256)   ondansetron (ZOFRAN) IV     promethazine (PHENERGAN) injection (IM or IVPB)        LOS: 3 days   Marinda Elk  [facility]  07/09/2023, 7:46 AM

## 2023-07-09 NOTE — TOC CM/SW Note (Signed)
Transition of Care [facility]) - Inpatient Brief Assessment   Patient Details  Name: Misty Deleon MRN: [id number] Date of Birth: 17-Jun-1951  [facility]) CM/SW Contact:    Howell Rucks, RN Phone Number: 07/09/2023, 4:07 PM   Clinical Narrative: Met with pt and spouse at bedside to introduce role of TOC/NCM and review for dc planning. Pt reports she has a PCP and pharmacy in place, no current home care services or home DME, pt reports she feels safe returning home with support from her spouse, confirmed transportation is available at discharge. TOC Brief Assessment completed. No TOC needs identified at this time.     Transition of Care Asessment: Insurance and Status: Insurance coverage has been reviewed Patient has primary care physician: Yes Home environment has been reviewed: resides in private residence with spouse Prior level of function:: Independent Prior/Current Home Services: No current home services Social Determinants of Health Reivew: SDOH reviewed no interventions necessary Readmission risk has been reviewed: Yes Transition of care needs: no transition of care needs at this time

## 2023-07-10 DIAGNOSIS — N390 Urinary tract infection, site not specified: Secondary | ICD-10-CM | POA: Diagnosis not present

## 2023-07-10 DIAGNOSIS — A4150 Gram-negative sepsis, unspecified: Secondary | ICD-10-CM | POA: Diagnosis not present

## 2023-07-10 NOTE — Plan of Care (Signed)
  Problem: Clinical Measurements: Goal: Will remain free from infection Outcome: Progressing Goal: Diagnostic test results will improve Outcome: Progressing   Problem: Activity: Goal: Risk for activity intolerance will decrease Outcome: Progressing   Problem: Nutrition: Goal: Adequate nutrition will be maintained Outcome: Progressing   Problem: Elimination: Goal: Will not experience complications related to urinary retention Outcome: Progressing   Problem: Nutritional: Goal: Maintenance of adequate nutrition will improve Outcome: Progressing   Problem: Education: Goal: Knowledge of General Education information will improve Description: Including pain rating scale, medication(s)/side effects and non-pharmacologic comfort measures Outcome: Adequate for Discharge   Problem: Health Behavior/Discharge Planning: Goal: Ability to manage health-related needs will improve Outcome: Adequate for Discharge   Problem: Clinical Measurements: Goal: Ability to maintain clinical measurements within normal limits will improve Outcome: Adequate for Discharge Goal: Respiratory complications will improve Outcome: Adequate for Discharge Goal: Cardiovascular complication will be avoided Outcome: Adequate for Discharge   Problem: Coping: Goal: Level of anxiety will decrease Outcome: Adequate for Discharge   Problem: Elimination: Goal: Will not experience complications related to bowel motility Outcome: Adequate for Discharge   Problem: Pain Management: Goal: General experience of comfort will improve Outcome: Adequate for Discharge   Problem: Safety: Goal: Ability to remain free from injury will improve Outcome: Adequate for Discharge   Problem: Skin Integrity: Goal: Risk for impaired skin integrity will decrease Outcome: Adequate for Discharge   Problem: Education: Goal: Ability to describe self-care measures that may prevent or decrease complications (Diabetes Survival Skills  Education) will improve Outcome: Adequate for Discharge   Problem: Coping: Goal: Ability to adjust to condition or change in health will improve Outcome: Adequate for Discharge   Problem: Fluid Volume: Goal: Ability to maintain a balanced intake and output will improve Outcome: Adequate for Discharge   Problem: Health Behavior/Discharge Planning: Goal: Ability to manage health-related needs will improve Outcome: Adequate for Discharge   Problem: Metabolic: Goal: Ability to maintain appropriate glucose levels will improve Outcome: Adequate for Discharge   Problem: Skin Integrity: Goal: Risk for impaired skin integrity will decrease Outcome: Adequate for Discharge   Problem: Tissue Perfusion: Goal: Adequacy of tissue perfusion will improve Outcome: Adequate for Discharge   Problem: Respiratory: Goal: Ability to maintain adequate ventilation will improve Outcome: Adequate for Discharge

## 2023-07-10 NOTE — Progress Notes (Signed)
[facility]  Date of Admission:  07/06/2023     Lines:  Right chest port   Abx: 10/28-c cephalexin   10/25-28 ceftriaxone --> cefepime                                                          Assessment: 72 yo female with cervical cancer s/p cisplatin tx but complicated by relapse and metastasis to lungs, on pemprolizumab, recent 4 weeks prior to admission onset of dysuria not improved with macrobid x2, admitted 10/25 for fever and ongoing dysuria along with gross hematuria   Bcx negative Flu/rsv/covid negative   Sx does overlap with uti syndrome although with her radiation therapy and checkpoint-inhibitor immune therapy we query if an autoimmune process relate cystitis is present along with 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 She only grew 10k of non-esbl ecoli this admission.    She was given augmentin a week prior to admission but had urine testing done at oncology clinic which doesn't grow anything so told to stop abx. Sx ongoing at that time   Ct this admission does suggest thickened bladder wall with stranding, intrarenal nonobstructing stone, and perirectal wall thickening/stranding. Again she did have radiation to pelvis area for cervical cancer   She reports resolved gross hematuria and improved dysuria with ceftriaxone/cefepime. The fever is ongoing on HD#3 though   Will continue treatment targetting what is growing. If fever and sx resolved in 2 more days we should look for non-infectious causes of cystitis   She reports stable chronic diarrhea intermittently. No other localizing symptomatology   There is some degree of dehydration due to nausea/vomiting related to tramadol use?    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 I had forwarded chart to dr Bertis Ruddy yesterday   Plan: Doreatha Martin the cephalexin course on 11/03 If sx not resolved completely by the time she finishes her abx therapy, should have urologic evaluation and the ddx of inflammatory cystitis will be top of differential If tomorrow fever resolved she can discharge from id standpoint Prn pyridium use discuss for pain on discharge and med is available over the counter for short-term use Discussed with primary team   Principal Problem:   Sepsis due to gram-negative UTI [facility]) Active Problems:   Cervical cancer (HCC)   Chronic kidney disease (CKD), stage III (moderate) (HCC)   Diabetes mellitus without complication (HCC)   Hypomagnesemia   Essential hypertension   Severe sepsis (HCC)   Radiation proctitis   Diarrhea   Allergies  Allergen Reactions   Lisinopril Swelling and Other (See Comments)    Entire tongue became swollen and the exterior front of the throat. No shortness of breath noted.    Scheduled Meds:  atenolol  100 mg Oral Daily   cephALEXin  500 mg Oral Q12H  Chlorhexidine Gluconate Cloth  6 each Topical Daily   insulin aspart  0-9 Units Subcutaneous TID WC   pantoprazole  40 mg Oral BID   psyllium  1 packet Oral Daily   rosuvastatin  40 mg Oral QHS   sodium chloride flush  10-40 mL Intracatheter Q12H   sucralfate  2 g Rectal BID   Continuous Infusions:  ondansetron (ZOFRAN) IV     promethazine (PHENERGAN) injection (IM or IVPB)     PRN Meds:.acetaminophen, diphenoxylate-atropine, hydrALAZINE, metoprolol tartrate, ondansetron (ZOFRAN) IV, mouth rinse, promethazine (PHENERGAN) injection (IM or IVPB), sodium  chloride flush, traMADol   SUBJECTIVE: Pain better still 4/10 No hematuria Fever curve improving  No other complaint  Review of Systems: ROS All other ROS was negative, except mentioned above     OBJECTIVE: Vitals:   07/09/23 2027 07/10/23 0533 07/10/23 0608 07/10/23 0934  BP: (!) 123/57 (!) 113/53  (!) 107/49  Pulse: 62 69  60  Resp: 17 18  19   Temp: 99.2 F (37.3 C) 100.3 F (37.9 C) 100 F (37.8 C) 98.8 F (37.1 C)  TempSrc: Oral Oral Oral Oral  SpO2: 99% 97%  98%  Weight:      Height:       Body mass index is 36.29 kg/m.  Physical Exam  General/constitutional: no distress, pleasant HEENT: Normocephalic, PER, Conj Clear, EOMI, Oropharynx clear Neck supple CV: rrr no mrg Lungs: clear to auscultation, normal respiratory effort Abd: Soft, Nontender Ext: no edema Skin: No Rash Neuro: nonfocal MSK: no peripheral joint swelling/tenderness/warmth; back spines nontender  Central line: right chest port site no erythema/purulence   Lab Results Lab Results  Component Value Date   WBC 4.6 07/07/2023   HGB 9.2 (L) 07/07/2023   HCT 28.3 (L) 07/07/2023   MCV 99.6 07/07/2023   PLT 168 07/07/2023    Lab Results  Component Value Date   CREATININE 1.24 (H) 07/09/2023   BUN 7 (L) 07/09/2023   NA 135 07/09/2023   K 3.4 (L) 07/09/2023   CL 106 07/09/2023   CO2 23 07/09/2023    Lab Results  Component Value Date   ALT 7 07/06/2023   AST 19 07/06/2023   ALKPHOS 50 07/06/2023   BILITOT 1.1 07/06/2023      Microbiology: Recent Results (from the past 240 hour(s))  Blood Culture (routine x 2)     Status: None (Preliminary result)   Collection Time: 07/06/23 12:08 PM   Specimen: BLOOD  Result Value Ref Range Status   Specimen Description   Final    BLOOD PORTA CATH Performed at [facility], [address], [place], [place] [address]    Special Requests   Final    Blood Culture results may not be optimal due to an excessive volume of  blood received in culture bottles BOTTLES DRAWN AEROBIC AND ANAEROBIC Performed at [facility], [address], [place], [place] [address]    Culture   Final    NO GROWTH 3 DAYS Performed at [facility], [address], [place], [place] [address]    Report Status PENDING  Incomplete  Urine Culture     Status: Abnormal   Collection Time: 07/06/23 12:09 PM   Specimen: Urine, Random  Result Value Ref Range Status   Specimen Description   Final    URINE, RANDOM Performed at [facility], [address] [place], [place] [address]    Special Requests   Final    NONE Reflexed  from [id number] Performed at [facility], [address] [place], [place] [address]    Culture 10,000 COLONIES/mL ESCHERICHIA COLI (A)  Final   Report Status 07/08/2023 FINAL  Final   Organism ID, Bacteria ESCHERICHIA COLI (A)  Final      Susceptibility   Escherichia coli - MIC*    AMPICILLIN >=32 RESISTANT Resistant     CEFAZOLIN 8 SENSITIVE Sensitive     CEFEPIME <=0.12 SENSITIVE Sensitive     CEFTRIAXONE <=0.25 SENSITIVE Sensitive     CIPROFLOXACIN >=4 RESISTANT Resistant     GENTAMICIN <=1 SENSITIVE Sensitive     IMIPENEM <=0.25 SENSITIVE Sensitive     NITROFURANTOIN <=16 SENSITIVE Sensitive     TRIMETH/SULFA >=320 RESISTANT Resistant     AMPICILLIN/SULBACTAM >=32 RESISTANT Resistant     PIP/TAZO 64 INTERMEDIATE Intermediate ug/mL    * 10,000 COLONIES/mL ESCHERICHIA COLI  Blood Culture (routine x 2)     Status: None (Preliminary result)   Collection Time: 07/06/23 12:13 PM   Specimen: BLOOD  Result Value Ref Range Status   Specimen Description   Final    BLOOD BLOOD LEFT ARM Performed at [facility], [address], [place], [place] [address]    Special Requests   Final    Blood Culture adequate volume BOTTLES DRAWN AEROBIC AND ANAEROBIC Performed at [facility], [address]  [place], [place] [address]    Culture   Final    NO GROWTH 3 DAYS Performed at [facility], [address] [place], [place] [address]    Report Status PENDING  Incomplete  Resp panel by RT-PCR (RSV, Flu A&B, Covid) Anterior Nasal Swab     Status: None   Collection Time: 07/06/23 12:43 PM   Specimen: Anterior Nasal Swab  Result Value Ref Range Status   SARS Coronavirus 2 by RT PCR NEGATIVE NEGATIVE Final    Comment: (NOTE) SARS-CoV-2 target nucleic acids are NOT DETECTED.  The SARS-CoV-2 RNA is generally detectable in upper respiratory specimens during the acute phase of infection. The lowest concentration of SARS-CoV-2 viral copies this assay can detect is 138 copies/mL. A negative result does not preclude SARS-Cov-2 infection and should not be used as the sole basis for treatment or other patient management decisions. A negative result may occur with  improper specimen collection/handling, submission of specimen other than nasopharyngeal swab, presence of viral mutation(s) within the areas targeted by this assay, and inadequate number of viral copies(<138 copies/mL). A negative result must be combined with clinical observations, patient history, and epidemiological information. The expected result is Negative.  Fact Sheet for Patients:  BloggerCourse.com  Fact Sheet for Healthcare Providers:  SeriousBroker.it  This test is no t yet approved or cleared by the [place] FDA and  has been authorized for detection and/or diagnosis of SARS-CoV-2 by FDA under an Emergency Use Authorization (EUA). This EUA will remain  in effect (meaning this test can be used) for the duration of the COVID-19 declaration under Section 564(b)(1) of the Act, 21 U.S.C.section 360bbb-3(b)(1), unless the authorization is terminated  or revoked sooner.       Influenza A by PCR NEGATIVE NEGATIVE Final   Influenza B by PCR NEGATIVE NEGATIVE Final     Comment: (NOTE) The Xpert Xpress SARS-CoV-2/FLU/RSV plus assay is intended as an aid in the diagnosis of influenza from Nasopharyngeal swab specimens and should not be used as a sole basis for treatment. Nasal washings and aspirates are unacceptable for Xpert Xpress  SARS-CoV-2/FLU/RSV testing.  Fact Sheet for Patients: BloggerCourse.com  Fact Sheet for Healthcare Providers: SeriousBroker.it  This test is not yet approved or cleared by the [place] FDA and has been authorized for detection and/or diagnosis of SARS-CoV-2 by FDA under an Emergency Use Authorization (EUA). This EUA will remain in effect (meaning this test can be used) for the duration of the COVID-19 declaration under Section 564(b)(1) of the Act, 21 U.S.C. section 360bbb-3(b)(1), unless the authorization is terminated or revoked.     Resp Syncytial Virus by PCR NEGATIVE NEGATIVE Final    Comment: (NOTE) Fact Sheet for Patients: BloggerCourse.com  Fact Sheet for Healthcare Providers: SeriousBroker.it  This test is not yet approved or cleared by the [place] FDA and has been authorized for detection and/or diagnosis of SARS-CoV-2 by FDA under an Emergency Use Authorization (EUA). This EUA will remain in effect (meaning this test can be used) for the duration of the COVID-19 declaration under Section 564(b)(1) of the Act, 21 U.S.C. section 360bbb-3(b)(1), unless the authorization is terminated or revoked.  Performed at [facility], [address], [place], [place] [address]      Serology:   Imaging: If present, new imagings (plain films, ct scans, and mri) have been personally visualized and interpreted; radiology reports have been reviewed. Decision making incorporated into the Impression / Recommendations.   Raymondo Band, MD [facility] [phone] pager    07/10/2023, 10:57 AM

## 2023-07-10 NOTE — Progress Notes (Signed)
[facility] PROGRESS NOTE    Progress Note  Misty Deleon  ZOX:[id number] DOB: 1951/02/29 DOA: 07/06/2023 PCP: Marletta Lor, NP     Brief Narrative:   Misty Deleon is an 72 y.o. 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  Having suprapubic pressure was found to be febrile at 101.7  Assessment/Plan:   Severe sepsis due to gram-negative UTI [facility]) CT scan of the abdomen pelvis nephrolithiasis with no obstruction and radiation proctitis. Thickening gallbladder wall and UA compatible with an infection.   Urine culture grew E. coli 10,000 colonies sensitive to Ancef. She continues to spike fever despite IV antibiotics. Infectious disease was consulted who recommended to continue oral Keflex for 2 more days if symptoms persist they recommended to get urology involved, for cytology. But fortunately she has defervesced. We will try to complete a 10-day course of antibiotics. Fever curve is trending down Tmax 100.0. Blood cultures remain negative till date.  Chronic kidney disease stage IIIb: Creatinine remained his baseline.  History of cervical cancer [facility]) With a history of radiation therapy currently on immunotherapy.  Diabetes mellitus type 2 without complications: Discontinue blood sugar checks she has required no insulin over several days she takes no oral hypoglycemic agents at home.  Hypokalemia: Repleted orally now  improved.  Essential hypertension: Blood pressure is controlled continue atenolol, continue to hold Norvasc and ARB. She will probably need to go home off Norvasc and ARB.  Sinus tach: With IV fluids and restarting her atenolol.  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 Discontinue sucralfate enemas started on Lomotil and Metamucil. She relates her diarrhea is improved.   DVT prophylaxis: lovenox Family Communication:none Status is: Inpatient Remains inpatient appropriate because: Severe sepsis due to UTI    Code Status:  Code Status History     Date Active Date Inactive Code Status Order ID Comments User Context   01/04/2023 1247 01/05/2023 2341 Full Code [id number]  Simonne Martinet, NP ED   08/24/2022 0944 08/25/2022 0514 Full Code [id number]  Mir, Al Corpus, MD HOV   12/08/2020 0551 12/08/2020 1413 Full Code [id number]  Antony Blackbird, MD Inpatient   11/25/2020 0649 11/27/2020 0520 Full Code [id number]  Antony Blackbird, MD Inpatient   11/16/2020 0540 11/16/2020 1440 Full Code [id number]  Antony Blackbird, MD Inpatient   11/09/2020 0700 11/09/2020 1550 Full Code [id number]  Antony Blackbird, MD Inpatient    Questions for Most Recent Historical Code Status (Order [id number])     Question Answer   By: Default: patient does not have capacity for decision making, no surrogate or prior directive available                Advance Directive Documentation    Flowsheet Row Most Recent Value  Type of Advance Directive --  [Pt unsure of type]  Pre-existing out of facility DNR order (yellow form or pink MOST form) --  "MOST" Form in Place? --         IV Access:  Peripheral IV   Procedures and diagnostic studies:   No results found.   Medical Consultants:   None.   Subjective:    Misty Deleon relates her dysuria is improving.  Objective:    Vitals:   07/09/23 2027 07/10/23 0533 07/10/23 0608 07/10/23 0934  BP:  (!) 123/57 (!) 113/53  (!) 107/49  Pulse: 62 69  60  Resp: 17 18  19   Temp: 99.2 F (37.3 C) 100.3 F (37.9 C) 100 F (37.8 C) 98.8 F (37.1 C)  TempSrc: Oral Oral Oral Oral  SpO2: 99% 97%  98%  Weight:      Height:       SpO2: 98 %   Intake/Output Summary (Last 24 hours) at 07/10/2023 0950 Last data filed at 07/10/2023 0900 Gross per 24 hour  Intake 130 ml  Output 600 ml  Net -470 ml   Filed Weights   07/08/23 1731  Weight: 90 kg    Exam: General exam: In no acute distress. Respiratory system: Good air movement and clear to auscultation. Cardiovascular system: S1 & S2 heard, RRR. No JVD. Gastrointestinal system: Abdomen is nondistended, soft and nontender.  Extremities: No pedal edema. Skin: No rashes, lesions or ulcers Psychiatry: Judgement and insight appear normal. Mood & affect appropriate.  Data Reviewed:    Labs: Basic Metabolic Panel: Recent Labs  Lab 07/03/23 1030 07/06/23 1208 07/07/23 1135 07/09/23 1206  NA 136 131* 130* 135  K 3.3* 3.0* 4.0 3.4*  CL 103 97* 101 106  CO2 26 22 23 23   GLUCOSE 143* 138* 159* 111*  BUN 11 7* 6* 7*  CREATININE 1.18* 1.05* 0.99 1.24*  CALCIUM 9.3 9.2 8.1* 7.5*   GFR Estimated Creatinine Clearance: 43.4 mL/min (A) (by C-G formula based on SCr of 1.24 mg/dL (H)). Liver Function Tests: Recent Labs  Lab 07/03/23 1030 07/06/23 1208  AST 18 19  ALT 9 7  ALKPHOS 46 50  BILITOT 0.8 1.1  PROT 6.6 6.6  ALBUMIN 3.5 3.5   No results for input(s): "LIPASE", "AMYLASE" in the last 168 hours. No results for input(s): "AMMONIA" in the last 168 hours. Coagulation profile Recent Labs  Lab 07/06/23 1243  INR 1.2   COVID-19 Labs  No results for input(s): "DDIMER", "FERRITIN", "LDH", "CRP" in the last 72 hours.  Lab Results  Component Value Date   SARSCOV2NAA NEGATIVE 07/06/2023   SARSCOV2NAA POSITIVE (A) 05/27/2021   SARSCOV2NAA NEGATIVE 12/07/2020   SARSCOV2NAA NEGATIVE 11/27/2020    CBC: Recent Labs  Lab  07/03/23 1030 07/06/23 1208 07/07/23 1135  WBC 4.7 4.7 4.6  NEUTROABS 2.9 3.2 3.1  HGB 10.4* 10.5* 9.2*  HCT 30.8* 30.6* 28.3*  MCV 98.4 95.0 99.6  PLT 179 157 168   Cardiac Enzymes: No results for input(s): "CKTOTAL", "CKMB", "CKMBINDEX", "TROPONINI" in the last 168 hours. BNP (last 3 results) No results for input(s): "PROBNP" in the last 8760 hours. CBG: Recent Labs  Lab 07/07/23 2109 07/08/23 0733 07/08/23 1156 07/08/23 1547 07/08/23 2131  GLUCAP 125* 84 95 87 105*   D-Dimer: No results for input(s): "DDIMER" in the last 72 hours. Hgb A1c: No results for input(s): "HGBA1C" in the last 72 hours. Lipid Profile: No results for input(s): "CHOL", "HDL", "LDLCALC", "TRIG", "CHOLHDL", "LDLDIRECT" in the last 72 hours. Thyroid function studies: No results for input(s): "TSH", "T4TOTAL", "T3FREE", "THYROIDAB" in the last 72 hours.  Invalid input(s): "FREET3" Anemia work up: No results for input(s): "VITAMINB12", "FOLATE", "FERRITIN", "TIBC", "IRON", "RETICCTPCT" in the last  72 hours. Sepsis Labs: Recent Labs  Lab 07/03/23 1030 07/06/23 1208 07/06/23 1243 07/06/23 1437 07/07/23 0058 07/07/23 0430 07/07/23 1135  WBC 4.7 4.7  --   --   --   --  4.6  LATICACIDVEN  --   --  1.4 2.5* 1.3 1.0  --    Microbiology Recent Results (from the past 240 hour(s))  Blood Culture (routine x 2)     Status: None (Preliminary result)   Collection Time: 07/06/23 12:08 PM   Specimen: BLOOD  Result Value Ref Range Status   Specimen Description   Final    BLOOD PORTA CATH Performed at [facility], [address] [place], [place] [address]    Special Requests   Final    Blood Culture results may not be optimal due to an excessive volume of blood received in culture bottles BOTTLES DRAWN AEROBIC AND ANAEROBIC Performed at [facility], [address] [place], [place] [address]    Culture   Final    NO GROWTH 3 DAYS Performed at [facility], [address], [place], [place] [address]    Report Status PENDING  Incomplete  Urine Culture     Status: Abnormal   Collection Time: 07/06/23 12:09 PM   Specimen: Urine, Random  Result Value Ref Range Status   Specimen Description   Final    URINE, RANDOM Performed at [facility], [address], [place], [place] [address]    Special Requests   Final    NONE Reflexed from [id number] Performed at [facility], [address], [place], [place] [address]    Culture 10,000 COLONIES/mL ESCHERICHIA COLI (A)  Final   Report Status 07/08/2023 FINAL  Final   Organism ID, Bacteria ESCHERICHIA COLI (A)  Final      Susceptibility   Escherichia coli - MIC*    AMPICILLIN >=32 RESISTANT Resistant     CEFAZOLIN 8 SENSITIVE Sensitive     CEFEPIME <=0.12 SENSITIVE Sensitive     CEFTRIAXONE <=0.25 SENSITIVE Sensitive     CIPROFLOXACIN >=4 RESISTANT Resistant     GENTAMICIN <=1 SENSITIVE Sensitive     IMIPENEM <=0.25 SENSITIVE Sensitive     NITROFURANTOIN <=16 SENSITIVE Sensitive     TRIMETH/SULFA >=320 RESISTANT Resistant     AMPICILLIN/SULBACTAM >=32 RESISTANT Resistant     PIP/TAZO 64 INTERMEDIATE Intermediate ug/mL    * 10,000 COLONIES/mL ESCHERICHIA COLI  Blood Culture (routine x 2)     Status: None (Preliminary result)   Collection Time: 07/06/23 12:13 PM   Specimen: BLOOD  Result Value Ref Range Status   Specimen Description   Final    BLOOD BLOOD LEFT ARM Performed at [facility], [address] [place], [place] [address]    Special Requests   Final    Blood Culture adequate volume BOTTLES DRAWN AEROBIC AND ANAEROBIC Performed at [facility], [address] [place], [place] [address]    Culture   Final    NO GROWTH 3 DAYS Performed at [facility], [address] [place], [place] [address]    Report Status PENDING  Incomplete  Resp panel by RT-PCR (RSV, Flu A&B, Covid) Anterior  Nasal Swab     Status: None   Collection Time: 07/06/23 12:43 PM   Specimen: Anterior Nasal Swab  Result Value Ref Range Status   SARS Coronavirus 2 by RT PCR NEGATIVE NEGATIVE Final    Comment: (NOTE) SARS-CoV-2 target nucleic acids are  NOT DETECTED.  The SARS-CoV-2 RNA is generally detectable in upper respiratory specimens during the acute phase of infection. The lowest concentration of SARS-CoV-2 viral copies this assay can detect is 138 copies/mL. A negative result does not preclude SARS-Cov-2 infection and should not be used as the sole basis for treatment or other patient management decisions. A negative result may occur with  improper specimen collection/handling, submission of specimen other than nasopharyngeal swab, presence of viral mutation(s) within the areas targeted by this assay, and inadequate number of viral copies(<138 copies/mL). A negative result must be combined with clinical observations, patient history, and epidemiological information. The expected result is Negative.  Fact Sheet for Patients:  BloggerCourse.com  Fact Sheet for Healthcare Providers:  SeriousBroker.it  This test is no t yet approved or cleared by the [place] FDA and  has been authorized for detection and/or diagnosis of SARS-CoV-2 by FDA under an Emergency Use Authorization (EUA). This EUA will remain  in effect (meaning this test can be used) for the duration of the COVID-19 declaration under Section 564(b)(1) of the Act, 21 U.S.C.section 360bbb-3(b)(1), unless the authorization is terminated  or revoked sooner.       Influenza A by PCR NEGATIVE NEGATIVE Final   Influenza B by PCR NEGATIVE NEGATIVE Final    Comment: (NOTE) The Xpert Xpress SARS-CoV-2/FLU/RSV plus assay is intended as an aid in the diagnosis of influenza from Nasopharyngeal swab specimens and should not be used as a sole basis for treatment. Nasal washings  and aspirates are unacceptable for Xpert Xpress SARS-CoV-2/FLU/RSV testing.  Fact Sheet for Patients: BloggerCourse.com  Fact Sheet for Healthcare Providers: SeriousBroker.it  This test is not yet approved or cleared by the [place] FDA and has been authorized for detection and/or diagnosis of SARS-CoV-2 by FDA under an Emergency Use Authorization (EUA). This EUA will remain in effect (meaning this test can be used) for the duration of the COVID-19 declaration under Section 564(b)(1) of the Act, 21 U.S.C. section 360bbb-3(b)(1), unless the authorization is terminated or revoked.     Resp Syncytial Virus by PCR NEGATIVE NEGATIVE Final    Comment: (NOTE) Fact Sheet for Patients: BloggerCourse.com  Fact Sheet for Healthcare Providers: SeriousBroker.it  This test is not yet approved or cleared by the [place] FDA and has been authorized for detection and/or diagnosis of SARS-CoV-2 by FDA under an Emergency Use Authorization (EUA). This EUA will remain in effect (meaning this test can be used) for the duration of the COVID-19 declaration under Section 564(b)(1) of the Act, 21 U.S.C. section 360bbb-3(b)(1), unless the authorization is terminated or revoked.  Performed at [facility], [address] [place], [place] [address]      Medications:    atenolol  100 mg Oral Daily   cephALEXin  500 mg Oral Q12H   Chlorhexidine Gluconate Cloth  6 each Topical Daily   insulin aspart  0-9 Units Subcutaneous TID WC   pantoprazole  40 mg Oral BID   psyllium  1 packet Oral Daily   rosuvastatin  40 mg Oral QHS   sodium chloride flush  10-40 mL Intracatheter Q12H   sucralfate  2 g Rectal BID   Continuous Infusions:  ondansetron (ZOFRAN) IV     promethazine (PHENERGAN) injection (IM or IVPB)        LOS: 4 days   Marinda Elk  [facility]  07/10/2023, 9:50 AM

## 2023-07-11 DIAGNOSIS — E876 Hypokalemia: Secondary | ICD-10-CM | POA: Diagnosis not present

## 2023-07-11 DIAGNOSIS — A419 Sepsis, unspecified organism: Secondary | ICD-10-CM | POA: Diagnosis not present

## 2023-07-11 DIAGNOSIS — N10 Acute tubulo-interstitial nephritis: Secondary | ICD-10-CM | POA: Diagnosis not present

## 2023-07-11 DIAGNOSIS — A4150 Gram-negative sepsis, unspecified: Secondary | ICD-10-CM | POA: Diagnosis not present

## 2023-07-11 LAB — CULTURE, BLOOD (ROUTINE X 2)
Culture: NO GROWTH
Culture: NO GROWTH
Special Requests: ADEQUATE

## 2023-07-11 MED ORDER — CEPHALEXIN 500 MG PO CAPS: 500.0000 mg | ORAL_CAPSULE | Freq: Two times a day (BID) | ORAL | 0 refills | Status: DC

## 2023-07-11 MED ORDER — HEPARIN SOD (PORK) LOCK FLUSH 100 UNIT/ML IV SOLN
500.0000 [IU] | INTRAVENOUS | Status: AC | PRN
  Administered 2023-07-11: 500 [IU]

## 2023-07-11 MED ORDER — BIOTENE DRY MOUTH MT LIQD: 15.0000 mL | OROMUCOSAL | 0 refills | Status: DC | PRN

## 2023-07-11 MED ORDER — PHENAZOPYRIDINE HCL 200 MG PO TABS: 200.0000 mg | ORAL_TABLET | Freq: Three times a day (TID) | ORAL | 0 refills | Status: DC

## 2023-07-11 MED ORDER — PHENAZOPYRIDINE HCL 100 MG PO TABS
200.0000 mg | ORAL_TABLET | Freq: Three times a day (TID) | ORAL | Status: DC
  Administered 2023-07-11: 200 mg via ORAL
  Filled 2023-07-11: qty 2

## 2023-07-11 MED ORDER — BIOTENE DRY MOUTH MT LIQD: 15.0000 mL | OROMUCOSAL | Status: DC | PRN

## 2023-07-11 NOTE — Discharge Summary (Signed)
Physician Discharge Summary  Misty Deleon NUU:[id number] DOB: 10/30/1950 DOA: 07/06/2023  PCP: Marletta Lor, NP  Admit date: 07/06/2023 Discharge date: 07/11/2023  Admitted From: Home Disposition:  Home  Recommendations for Outpatient Follow-up:  Follow up with oncology in 1-2 weeks Please obtain BMP/CBC in one week Follow-up with urology for possible cystoscopy as she continues to have dysuria   Home Health:no Equipment/Devices:None  Discharge Condition:Stable CODE STATUS:Full Diet recommendation: Heart Healthy   Brief/Interim Summary: 72 y.o. 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  Having suprapubic pressure was found to be febrile at 101.7   Discharge Diagnoses:  Principal Problem:   Sepsis due to gram-negative UTI [facility]) Active Problems:   Cervical cancer (HCC)   Chronic kidney disease (CKD), stage III (moderate) (HCC)   Diabetes mellitus without complication (HCC)   Hypomagnesemia   Essential hypertension   Severe sepsis (HCC)   Radiation proctitis   Diarrhea  Severe sepsis due to gram-negative UTI: She was started empirically on antibiotics CT scan of the abdomen pelvis showed nephrolithiasis with no obstruction radiation proctitis and thickening of the gallbladder UA was compatible with infection urine culture grew less than 10,000 colonies of E. coli but she was previously on antibiotics.  Sensitive to Ancef. ID was consulted  recommended to continue oral Keflex as she was there resting. Patient continued to have dysuria, she was started on Pyridium which she will continue as an outpatient. She will follow-up with urology as an outpatient for possible cystoscopy. She will continue antibiotics for 10 additional days postdischarge. Her blood cultures remain negative x 2.  Chronic kidney disease stage IIIb: Her creatinine remained at baseline.  History of cervical cancer: With a history of radiation and immunotherapy follow-up with oncology in 2 weeks.  Diabetes mellitus type 2 without complications: No changes made to her medication continue current regimen.  Hypokalemia: Repleted now resolved.  Essential hypertension: Her blood pressure is well-controlled just on atenolol. She will go home off Norvasc and ARB.  Sinus tachycardia: Resolved with IV fluid and starting atenolol.  Diarrhea probably due to radiation proctitis: Improved with lomotil and metamucil.    Discharge Instructions  Discharge Instructions     Diet - low sodium heart healthy   Complete by: As directed    Increase activity slowly   Complete by: As directed       Allergies as of 07/11/2023       Reactions   Lisinopril Swelling, Other (See Comments)   Entire tongue became swollen and the exterior front of the throat. No shortness of breath noted.        Medication List     TAKE these medications    amLODipine 10 MG tablet Commonly known as: NORVASC Take 1 tablet (10 mg total) by mouth daily.   antiseptic oral rinse Liqd 15 mLs by Mouth Rinse route as needed for dry mouth.   atenolol 100 MG tablet Commonly known as: Tenormin Take 1 tablet (100 mg total) by mouth daily. Take in the morning  calcium carbonate 500 MG chewable tablet Commonly known as: TUMS - dosed in mg elemental calcium Chew 1 tablet by mouth daily.   cephALEXin 500 MG capsule Commonly known as: KEFLEX Take 1 capsule (500 mg total) by mouth  every 12 (twelve) hours for 10 days.   cholecalciferol 25 MCG (1000 UNIT) tablet Commonly known as: VITAMIN D3 Take 2,000 Units by mouth daily.   cyanocobalamin 1000 MCG tablet Commonly known as: VITAMIN B12 Take 1,000 mcg by mouth in the morning.   diphenhydrAMINE 25 MG tablet Commonly known as: Benadryl Allergy Take 2 tablets (50 mg total) by mouth every 6 (six) hours as needed for allergies.   ezetimibe 10 MG tablet Commonly known as: ZETIA Take 10 mg by mouth in the morning.   famotidine 20 MG tablet Commonly known as: Pepcid Take 1 tablet (20 mg total) by mouth daily for 5 days.   glimepiride 1 MG tablet Commonly known as: AMARYL Take 1 mg by mouth daily with breakfast.   lidocaine-prilocaine cream Commonly known as: EMLA Apply 1 Application topically as needed (for port access).   losartan 100 MG tablet Commonly known as: Cozaar Take 1 tablet (100 mg total) by mouth every evening.   losartan 50 MG tablet Commonly known as: COZAAR TAKE 1 TABLET(50 MG) BY MOUTH EVERY EVENING   ondansetron 8 MG tablet Commonly known as: Zofran Take 1 tablet (8 mg total) by mouth every 8 (eight) hours as needed for nausea or vomiting. Start on the third day after chemotherapy.   phenazopyridine 200 MG tablet Commonly known as: PYRIDIUM Take 1 tablet (200 mg total) by mouth 3 (three) times daily with meals.   prochlorperazine 10 MG tablet Commonly known as: COMPAZINE Take 1 tablet (10 mg total) by mouth every 6 (six) hours as needed for nausea or vomiting.   rosuvastatin 40 MG tablet Commonly known as: CRESTOR Take 40 mg by mouth at bedtime.   sitaGLIPtin 100 MG tablet Commonly known as: JANUVIA Take 50 mg by mouth in the morning.   traMADol 50 MG tablet Commonly known as: ULTRAM Take 2 tablets (100 mg total) by mouth every 6 (six) hours as needed. What changed: reasons to take this   TYLENOL 500 MG tablet Generic drug: acetaminophen Take 500-1,000 mg by mouth every 6  (six) hours as needed for mild pain or headache.        Allergies  Allergen Reactions   Lisinopril Swelling and Other (See Comments)    Entire tongue became swollen and the exterior front of the throat. No shortness of breath noted.    Consultations: Infectious disease   Procedures/Studies: CT ABDOMEN PELVIS W CONTRAST  Result Date: 07/06/2023 CLINICAL DATA:  Left lower quadrant pain. Fever, nausea, vomiting and diarrhea. History of cervical cancer with lung metastases. EXAM: CT ABDOMEN AND PELVIS WITH CONTRAST TECHNIQUE: Multidetector CT imaging of the abdomen and pelvis was performed using the standard protocol following bolus administration of intravenous contrast. RADIATION DOSE REDUCTION: This exam was performed according to the departmental dose-optimization program which includes automated exposure control, adjustment of the mA and/or kV according to patient size and/or use of iterative reconstruction technique. CONTRAST:  OMNIPAQUE IOHEXOL 300 MG/ML  SOLN COMPARISON:  CT 05/17/2023 and older. FINDINGS: Lower chest: Lung bases are grossly clear. No pleural effusion. Trace pericardial fluid. Hepatobiliary: Fatty liver infiltration. No space-occupying liver lesion. Hepatic granulomas. Patent portal vein. Gallbladder is distended. Pancreas: Unremarkable. No pancreatic ductal dilatation or surrounding inflammatory changes. Spleen: Splenic granulomas are  identified. Small splenule anteriorly and in the hilum. Adrenals/Urinary Tract: Adrenal glands are preserved. No left-sided enhancing renal mass or collecting system dilatation. The left ureter has a normal course and caliber down to the bladder. The bladder is contracted with some wall thickening and stranding. Please correlate for any clinical evidence of cystitis. Bifid left renal collecting system. The previous 7 mm stone in the right renal pelvis is now in the lower pole of the right kidney. Second nonobstructing upper pole stone is  stable. However there is new moderate right renal collecting system dilatation with increasing perinephric stranding. There is some urothelial thickening as well along the course of the right renal pelvis and ureter. The ureter does slowly taper to a more normal caliber towards the UVJ. No distal ureteral stone. Please correlate for any signs of urothelial infection. Stomach/Bowel: Stomach is relatively decompressed. The small bowel is nondilated. Large bowel has a normal course and caliber with some scattered colonic diverticula particularly of the sigmoid colon. There is wall thickening seen in the area of the sigmoid colon with the adjacent stranding and thickening of the mesorectal fascia. Vascular/Lymphatic: Aortic atherosclerosis. No enlarged abdominal or pelvic lymph nodes. Reproductive: Uterus is present. No adnexal mass. Increasing soft tissue stranding. Other: No free air.  No developing ascites. Musculoskeletal: Scattered degenerative changes of the spine and pelvis. Stable compression of the inferior endplate of L5 with sclerosis. IMPRESSION: Interval development of ectasia of the right renal collecting system with urothelial thickening and stranding. There are some nonobstructing intrarenal stones but no ureteral stones. Please correlate for any known history including for a urothelial infection. The urinary bladder is also thickened and there is some adjacent stranding. Please correlate for any suggestion a developing stricture. Overall with appearance would recommend follow up to confirm resolution and exclude secondary pathology. Increasing wall thickening along the rectum with perirectal space stranding and thickening of the mesorectal fascia. Please correlate for known history of radiation. 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 Electronically Signed   By: Karen Kays M.D.   On: 07/06/2023 14:43   DG Chest Port 1 View  Result Date: 07/06/2023 CLINICAL DATA:  Questionable sepsis - evaluate for  abnormality. Abdominal pain. EXAM: PORTABLE CHEST 1 VIEW COMPARISON:  08/24/2022. FINDINGS: Bilateral lung fields are clear. Again seen is elevated right hemidiaphragm. Bilateral costophrenic angles are clear. Normal cardio-mediastinal silhouette. No acute osseous abnormalities. The soft tissues are within normal limits. Right-sided CT Port-A-Cath is seen with its tip overlying the upper portion of superior vena cava. IMPRESSION: *No active disease. Electronically Signed   By: Jules Schick M.D.   On: 07/06/2023 14:15    Subjective: No new complaints  Discharge Exam: Vitals:   07/11/23 0537 07/11/23 0810  BP: (!) 123/49 (!) 125/54  Pulse: 62   Resp: 18 20  Temp: 98.9 F (37.2 C) 98.3 F (36.8 C)  SpO2: 95% 98%   Vitals:   07/10/23 1851 07/10/23 2047 07/11/23 0537 07/11/23 0810  BP:  129/62 (!) 123/49 (!) 125/54  Pulse:  62 62   Resp:  19 18 20   Temp: 98.9 F (37.2 C) 100 F (37.8 C) 98.9 F (37.2 C) 98.3 F (36.8 C)  TempSrc: Oral Oral Oral Oral  SpO2:  98% 95% 98%  Weight:      Height:        General: Pt is alert, awake, not in acute distress Cardiovascular: RRR, S1/S2 +, no rubs, no gallops Respiratory: CTA bilaterally, no wheezing, no rhonchi Abdominal: Soft, NT,  ND, bowel sounds + Extremities: no edema, no cyanosis    The results of significant diagnostics from this hospitalization (including imaging, microbiology, ancillary and laboratory) are listed below for reference.     Microbiology: Recent Results (from the past 240 hour(s))  Blood Culture (routine x 2)     Status: None   Collection Time: 07/06/23 12:08 PM   Specimen: BLOOD  Result Value Ref Range Status   Specimen Description   Final    BLOOD PORTA CATH Performed at [facility], [address], [place], [place] [address]    Special Requests   Final    Blood Culture results may not be optimal due to an excessive volume of blood received in culture bottles BOTTLES DRAWN AEROBIC AND  ANAEROBIC Performed at [facility], [address] [place], [place] [address]    Culture   Final    NO GROWTH 5 DAYS Performed at [facility], [address] [place], [place] [address]    Report Status 07/11/2023 FINAL  Final  Urine Culture     Status: Abnormal   Collection Time: 07/06/23 12:09 PM   Specimen: Urine, Random  Result Value Ref Range Status   Specimen Description   Final    URINE, RANDOM Performed at [facility], [address], [place], [place] [address]    Special Requests   Final    NONE Reflexed from [id number] Performed at [facility], [address], [place], [place] [address]    Culture 10,000 COLONIES/mL ESCHERICHIA COLI (A)  Final   Report Status 07/08/2023 FINAL  Final   Organism ID, Bacteria ESCHERICHIA COLI (A)  Final      Susceptibility   Escherichia coli - MIC*    AMPICILLIN >=32 RESISTANT Resistant     CEFAZOLIN 8 SENSITIVE Sensitive     CEFEPIME <=0.12 SENSITIVE Sensitive     CEFTRIAXONE <=0.25 SENSITIVE Sensitive     CIPROFLOXACIN >=4 RESISTANT Resistant     GENTAMICIN <=1 SENSITIVE Sensitive     IMIPENEM <=0.25 SENSITIVE Sensitive     NITROFURANTOIN <=16 SENSITIVE Sensitive     TRIMETH/SULFA >=320 RESISTANT Resistant     AMPICILLIN/SULBACTAM >=32 RESISTANT Resistant     PIP/TAZO 64 INTERMEDIATE Intermediate ug/mL    * 10,000 COLONIES/mL ESCHERICHIA COLI  Blood Culture (routine x 2)     Status: None   Collection Time: 07/06/23 12:13 PM   Specimen: BLOOD  Result Value Ref Range Status   Specimen Description   Final    BLOOD BLOOD LEFT ARM Performed at [facility], [address], [place], [place] [address]    Special Requests   Final    Blood Culture adequate volume BOTTLES DRAWN AEROBIC AND ANAEROBIC Performed at [facility], [address] [place], [place] [address]    Culture   Final    NO GROWTH 5 DAYS Performed at [facility], [address], [place], [place] [address]    Report Status 07/11/2023 FINAL  Final  Resp panel by RT-PCR (RSV, Flu A&B, Covid) Anterior Nasal Swab     Status: None   Collection Time: 07/06/23 12:43 PM   Specimen: Anterior Nasal Swab  Result Value Ref Range Status   SARS Coronavirus 2 by RT PCR NEGATIVE NEGATIVE Final    Comment: (NOTE) SARS-CoV-2 target nucleic acids are NOT DETECTED.  The SARS-CoV-2 RNA is generally detectable in upper respiratory specimens during the acute phase of infection. The lowest concentration of  SARS-CoV-2 viral copies this assay can detect is 138 copies/mL. A negative result does not preclude SARS-Cov-2 infection and should not be used as the sole basis for treatment or other patient management decisions. A negative result may occur with  improper specimen collection/handling, submission of specimen other than nasopharyngeal swab, presence of viral mutation(s) within the areas targeted by this assay, and inadequate number of viral copies(<138 copies/mL). A negative result must be combined with clinical observations, patient history, and epidemiological information. The expected result is Negative.  Fact Sheet for Patients:  BloggerCourse.com  Fact Sheet for Healthcare Providers:  SeriousBroker.it  This test is no t yet approved or cleared by the [place] FDA and  has been authorized for detection and/or diagnosis of SARS-CoV-2 by FDA under an Emergency Use Authorization (EUA). This EUA will remain  in effect (meaning this test can be used) for the duration of the COVID-19 declaration under Section 564(b)(1) of the Act, 21 U.S.C.section 360bbb-3(b)(1), unless the authorization is terminated  or revoked sooner.       Influenza A by PCR NEGATIVE NEGATIVE Final   Influenza B by PCR NEGATIVE NEGATIVE Final    Comment: (NOTE) The Xpert Xpress SARS-CoV-2/FLU/RSV plus assay is intended  as an aid in the diagnosis of influenza from Nasopharyngeal swab specimens and should not be used as a sole basis for treatment. Nasal washings and aspirates are unacceptable for Xpert Xpress SARS-CoV-2/FLU/RSV testing.  Fact Sheet for Patients: BloggerCourse.com  Fact Sheet for Healthcare Providers: SeriousBroker.it  This test is not yet approved or cleared by the [place] FDA and has been authorized for detection and/or diagnosis of SARS-CoV-2 by FDA under an Emergency Use Authorization (EUA). This EUA will remain in effect (meaning this test can be used) for the duration of the COVID-19 declaration under Section 564(b)(1) of the Act, 21 U.S.C. section 360bbb-3(b)(1), unless the authorization is terminated or revoked.     Resp Syncytial Virus by PCR NEGATIVE NEGATIVE Final    Comment: (NOTE) Fact Sheet for Patients: BloggerCourse.com  Fact Sheet for Healthcare Providers: SeriousBroker.it  This test is not yet approved or cleared by the [place] FDA and has been authorized for detection and/or diagnosis of SARS-CoV-2 by FDA under an Emergency Use Authorization (EUA). This EUA will remain in effect (meaning this test can be used) for the duration of the COVID-19 declaration under Section 564(b)(1) of the Act, 21 U.S.C. section 360bbb-3(b)(1), unless the authorization is terminated or revoked.  Performed at [facility], [address] [place], [place] [address]      Labs: BNP (last 3 results) No results for input(s): "BNP" in the last 8760 hours. Basic Metabolic Panel: Recent Labs  Lab 07/06/23 1208 07/07/23 1135 07/09/23 1206  NA 131* 130* 135  K 3.0* 4.0 3.4*  CL 97* 101 106  CO2 22 23 23   GLUCOSE 138* 159* 111*  BUN 7* 6* 7*  CREATININE 1.05* 0.99 1.24*  CALCIUM 9.2 8.1* 7.5*   Liver Function Tests: Recent Labs  Lab  07/06/23 1208  AST 19  ALT 7  ALKPHOS 50  BILITOT 1.1  PROT 6.6  ALBUMIN 3.5   No results for input(s): "LIPASE", "AMYLASE" in the last 168 hours. No results for input(s): "AMMONIA" in the last 168 hours. CBC: Recent Labs  Lab 07/06/23 1208 07/07/23 1135  WBC 4.7 4.6  NEUTROABS 3.2 3.1  HGB 10.5* 9.2*  HCT 30.6* 28.3*  MCV 95.0 99.6  PLT 157 168   Cardiac Enzymes: No  results for input(s): "CKTOTAL", "CKMB", "CKMBINDEX", "TROPONINI" in the last 168 hours. BNP: Invalid input(s): "POCBNP" CBG: Recent Labs  Lab 07/07/23 2109 07/08/23 0733 07/08/23 1156 07/08/23 1547 07/08/23 2131  GLUCAP 125* 84 95 87 105*   D-Dimer No results for input(s): "DDIMER" in the last 72 hours. Hgb A1c No results for input(s): "HGBA1C" in the last 72 hours. Lipid Profile No results for input(s): "CHOL", "HDL", "LDLCALC", "TRIG", "CHOLHDL", "LDLDIRECT" in the last 72 hours. Thyroid function studies No results for input(s): "TSH", "T4TOTAL", "T3FREE", "THYROIDAB" in the last 72 hours.  Invalid input(s): "FREET3" Anemia work up No results for input(s): "VITAMINB12", "FOLATE", "FERRITIN", "TIBC", "IRON", "RETICCTPCT" in the last 72 hours. Urinalysis    Component Value Date/Time   COLORURINE YELLOW 07/06/2023 1209   APPEARANCEUR CLOUDY (A) 07/06/2023 1209   LABSPEC 1.017 07/06/2023 1209   PHURINE 5.5 07/06/2023 1209   GLUCOSEU NEGATIVE 07/06/2023 1209   HGBUR LARGE (A) 07/06/2023 1209   BILIRUBINUR NEGATIVE 07/06/2023 1209   KETONESUR 15 (A) 07/06/2023 1209   PROTEINUR 100 (A) 07/06/2023 1209   NITRITE NEGATIVE 07/06/2023 1209   LEUKOCYTESUR LARGE (A) 07/06/2023 1209   Sepsis Labs Recent Labs  Lab 07/06/23 1208 07/07/23 1135  WBC 4.7 4.6   Microbiology Recent Results (from the past 240 hour(s))  Blood Culture (routine x 2)     Status: None   Collection Time: 07/06/23 12:08 PM   Specimen: BLOOD  Result Value Ref Range Status   Specimen Description   Final    BLOOD PORTA  CATH Performed at [facility], [address] [place], [place] [address]    Special Requests   Final    Blood Culture results may not be optimal due to an excessive volume of blood received in culture bottles BOTTLES DRAWN AEROBIC AND ANAEROBIC Performed at [facility], [address], [place], [place] [address]    Culture   Final    NO GROWTH 5 DAYS Performed at [facility], [address] [place], [place] [address]    Report Status 07/11/2023 FINAL  Final  Urine Culture     Status: Abnormal   Collection Time: 07/06/23 12:09 PM   Specimen: Urine, Random  Result Value Ref Range Status   Specimen Description   Final    URINE, RANDOM Performed at [facility], [address], [place], [place] [address]    Special Requests   Final    NONE Reflexed from [id number] Performed at [facility], [address] [place], [place] [address]    Culture 10,000 COLONIES/mL ESCHERICHIA COLI (A)  Final   Report Status 07/08/2023 FINAL  Final   Organism ID, Bacteria ESCHERICHIA COLI (A)  Final      Susceptibility   Escherichia coli - MIC*    AMPICILLIN >=32 RESISTANT Resistant     CEFAZOLIN 8 SENSITIVE Sensitive     CEFEPIME <=0.12 SENSITIVE Sensitive     CEFTRIAXONE <=0.25 SENSITIVE Sensitive     CIPROFLOXACIN >=4 RESISTANT Resistant     GENTAMICIN <=1 SENSITIVE Sensitive     IMIPENEM <=0.25 SENSITIVE Sensitive     NITROFURANTOIN <=16 SENSITIVE Sensitive     TRIMETH/SULFA >=320 RESISTANT Resistant     AMPICILLIN/SULBACTAM >=32 RESISTANT Resistant     PIP/TAZO 64 INTERMEDIATE Intermediate ug/mL    * 10,000 COLONIES/mL ESCHERICHIA COLI  Blood Culture (routine x 2)     Status: None   Collection Time: 07/06/23 12:13 PM   Specimen: BLOOD  Result Value  Ref Range Status   Specimen Description   Final    BLOOD BLOOD LEFT ARM Performed at [facility], [address], [place], [place]  [address]    Special Requests   Final    Blood Culture adequate volume BOTTLES DRAWN AEROBIC AND ANAEROBIC Performed at [facility], [address], [place], [place] [address]    Culture   Final    NO GROWTH 5 DAYS Performed at [facility], [address] [place], [place] [address]    Report Status 07/11/2023 FINAL  Final  Resp panel by RT-PCR (RSV, Flu A&B, Covid) Anterior Nasal Swab     Status: None   Collection Time: 07/06/23 12:43 PM   Specimen: Anterior Nasal Swab  Result Value Ref Range Status   SARS Coronavirus 2 by RT PCR NEGATIVE NEGATIVE Final    Comment: (NOTE) SARS-CoV-2 target nucleic acids are NOT DETECTED.  The SARS-CoV-2 RNA is generally detectable in upper respiratory specimens during the acute phase of infection. The lowest concentration of SARS-CoV-2 viral copies this assay can detect is 138 copies/mL. A negative result does not preclude SARS-Cov-2 infection and should not be used as the sole basis for treatment or other patient management decisions. A negative result may occur with  improper specimen collection/handling, submission of specimen other than nasopharyngeal swab, presence of viral mutation(s) within the areas targeted by this assay, and inadequate number of viral copies(<138 copies/mL). A negative result must be combined with clinical observations, patient history, and epidemiological information. The expected result is Negative.  Fact Sheet for Patients:  BloggerCourse.com  Fact Sheet for Healthcare Providers:  SeriousBroker.it  This test is no t yet approved or cleared by the [place] FDA and  has been authorized for detection and/or diagnosis of SARS-CoV-2 by FDA under an Emergency Use Authorization (EUA). This EUA will remain  in effect (meaning this test can be used) for the duration of the COVID-19 declaration under Section 564(b)(1) of the Act,  21 U.S.C.section 360bbb-3(b)(1), unless the authorization is terminated  or revoked sooner.       Influenza A by PCR NEGATIVE NEGATIVE Final   Influenza B by PCR NEGATIVE NEGATIVE Final    Comment: (NOTE) The Xpert Xpress SARS-CoV-2/FLU/RSV plus assay is intended as an aid in the diagnosis of influenza from Nasopharyngeal swab specimens and should not be used as a sole basis for treatment. Nasal washings and aspirates are unacceptable for Xpert Xpress SARS-CoV-2/FLU/RSV testing.  Fact Sheet for Patients: BloggerCourse.com  Fact Sheet for Healthcare Providers: SeriousBroker.it  This test is not yet approved or cleared by the [place] FDA and has been authorized for detection and/or diagnosis of SARS-CoV-2 by FDA under an Emergency Use Authorization (EUA). This EUA will remain in effect (meaning this test can be used) for the duration of the COVID-19 declaration under Section 564(b)(1) of the Act, 21 U.S.C. section 360bbb-3(b)(1), unless the authorization is terminated or revoked.     Resp Syncytial Virus by PCR NEGATIVE NEGATIVE Final    Comment: (NOTE) Fact Sheet for Patients: BloggerCourse.com  Fact Sheet for Healthcare Providers: SeriousBroker.it  This test is not yet approved or cleared by the [place] FDA and has been authorized for detection and/or diagnosis of SARS-CoV-2 by FDA under an Emergency Use Authorization (EUA). This EUA will remain in effect (meaning this test can be used) for the duration of the COVID-19 declaration under Section 564(b)(1) of the Act, 21 U.S.C. section 360bbb-3(b)(1), unless the authorization is terminated  or revoked.  Performed at [facility], [address] [place], [place] [address]      Time coordinating discharge: Over 35 minutes  SIGNED:   Marinda Elk, MD  [facility] 07/11/2023, 9:54 AM Pager   If 7PM-7AM, please contact night-coverage [email or web address] Password [email or web address]

## 2023-07-11 NOTE — Progress Notes (Signed)
Pt informed about d/c/ Awaiting on husband to come and pick her up.

## 2023-07-11 NOTE — Progress Notes (Signed)
Patient discharged: Home with family  Via: Wheelchair   Discharge paperwork given: to patient and family  Reviewed with teach back  IV and telemetry disconnected  Belongings given to patient    

## 2023-07-13 ENCOUNTER — Other Ambulatory Visit: Payer: Self-pay

## 2023-07-13 ENCOUNTER — Inpatient Hospital Stay (HOSPITAL_COMMUNITY): Payer: Medicare PPO

## 2023-07-13 ENCOUNTER — Encounter (HOSPITAL_COMMUNITY): Payer: Self-pay | Admitting: Emergency Medicine

## 2023-07-13 ENCOUNTER — Inpatient Hospital Stay (HOSPITAL_COMMUNITY)
Admission: EM | Admit: 2023-07-13 | Discharge: 2023-07-16 | DRG: 372 | Disposition: A | Discharge status: Home / Self Care | Payer: Medicare PPO | Attending: Family Medicine | Admitting: Family Medicine

## 2023-07-13 DIAGNOSIS — A0472 Enterocolitis due to Clostridium difficile, not specified as recurrent: Principal | ICD-10-CM

## 2023-07-13 DIAGNOSIS — N179 Acute kidney failure, unspecified: Secondary | ICD-10-CM | POA: Diagnosis not present

## 2023-07-13 DIAGNOSIS — Z8541 Personal history of malignant neoplasm of cervix uteri: Secondary | ICD-10-CM | POA: Diagnosis not present

## 2023-07-13 DIAGNOSIS — Z87440 Personal history of urinary (tract) infections: Secondary | ICD-10-CM | POA: Diagnosis not present

## 2023-07-13 DIAGNOSIS — I10 Essential (primary) hypertension: Secondary | ICD-10-CM | POA: Diagnosis present

## 2023-07-13 DIAGNOSIS — E876 Hypokalemia: Secondary | ICD-10-CM

## 2023-07-13 DIAGNOSIS — Z888 Allergy status to other drugs, medicaments and biological substances status: Secondary | ICD-10-CM

## 2023-07-13 DIAGNOSIS — N390 Urinary tract infection, site not specified: Secondary | ICD-10-CM | POA: Diagnosis present

## 2023-07-13 DIAGNOSIS — E66812 Obesity, class 2: Secondary | ICD-10-CM | POA: Diagnosis present

## 2023-07-13 DIAGNOSIS — Z7984 Long term (current) use of oral hypoglycemic drugs: Secondary | ICD-10-CM

## 2023-07-13 DIAGNOSIS — Z923 Personal history of irradiation: Secondary | ICD-10-CM

## 2023-07-13 DIAGNOSIS — N200 Calculus of kidney: Secondary | ICD-10-CM | POA: Diagnosis not present

## 2023-07-13 DIAGNOSIS — K219 Gastro-esophageal reflux disease without esophagitis: Secondary | ICD-10-CM | POA: Diagnosis present

## 2023-07-13 DIAGNOSIS — N3001 Acute cystitis with hematuria: Secondary | ICD-10-CM | POA: Diagnosis not present

## 2023-07-13 DIAGNOSIS — Z808 Family history of malignant neoplasm of other organs or systems: Secondary | ICD-10-CM

## 2023-07-13 DIAGNOSIS — Z6836 Body mass index (BMI) 36.0-36.9, adult: Secondary | ICD-10-CM | POA: Diagnosis not present

## 2023-07-13 DIAGNOSIS — E119 Type 2 diabetes mellitus without complications: Secondary | ICD-10-CM | POA: Diagnosis not present

## 2023-07-13 DIAGNOSIS — Z79899 Other long term (current) drug therapy: Secondary | ICD-10-CM | POA: Diagnosis not present

## 2023-07-13 DIAGNOSIS — E7800 Pure hypercholesterolemia, unspecified: Secondary | ICD-10-CM | POA: Diagnosis present

## 2023-07-13 DIAGNOSIS — C539 Malignant neoplasm of cervix uteri, unspecified: Secondary | ICD-10-CM | POA: Diagnosis present

## 2023-07-13 DIAGNOSIS — R1110 Vomiting, unspecified: Secondary | ICD-10-CM | POA: Diagnosis not present

## 2023-07-13 DIAGNOSIS — E860 Dehydration: Secondary | ICD-10-CM | POA: Diagnosis present

## 2023-07-13 DIAGNOSIS — Z833 Family history of diabetes mellitus: Secondary | ICD-10-CM | POA: Diagnosis not present

## 2023-07-13 DIAGNOSIS — R197 Diarrhea, unspecified: Secondary | ICD-10-CM | POA: Diagnosis not present

## 2023-07-13 LAB — URINALYSIS, ROUTINE W REFLEX MICROSCOPIC
Bilirubin Urine: NEGATIVE
Glucose, UA: NEGATIVE mg/dL
Ketones, ur: NEGATIVE mg/dL
Nitrite: POSITIVE — AB
Protein, ur: 30 mg/dL — AB
Specific Gravity, Urine: 1.006 (ref 1.005–1.030)
pH: 5 (ref 5.0–8.0)

## 2023-07-13 LAB — COMPREHENSIVE METABOLIC PANEL
ALT: 12 U/L (ref 0–44)
AST: 28 U/L (ref 15–41)
Albumin: 2.5 g/dL — ABNORMAL LOW (ref 3.5–5.0)
Alkaline Phosphatase: 63 U/L (ref 38–126)
Anion gap: 13 (ref 5–15)
BUN: 10 mg/dL (ref 8–23)
CO2: 25 mmol/L (ref 22–32)
Calcium: 8.4 mg/dL — ABNORMAL LOW (ref 8.9–10.3)
Chloride: 95 mmol/L — ABNORMAL LOW (ref 98–111)
Creatinine, Ser: 1.57 mg/dL — ABNORMAL HIGH (ref 0.44–1.00)
GFR, Estimated: 35 mL/min — ABNORMAL LOW (ref >60)
Glucose, Bld: 80 mg/dL (ref 70–99)
Potassium: 2.9 mmol/L — ABNORMAL LOW (ref 3.5–5.1)
Sodium: 133 mmol/L — ABNORMAL LOW (ref 135–145)
Total Bilirubin: 0.6 mg/dL (ref 0.3–1.2)
Total Protein: 5.6 g/dL — ABNORMAL LOW (ref 6.5–8.1)

## 2023-07-13 LAB — CBC
HCT: 27.9 % — ABNORMAL LOW (ref 36.0–46.0)
HCT: 27.3 % — ABNORMAL LOW (ref 36.0–46.0)
Hemoglobin: 9.3 g/dL — ABNORMAL LOW (ref 12.0–15.0)
Hemoglobin: 8.6 g/dL — ABNORMAL LOW (ref 12.0–15.0)
MCH: 33 pg (ref 26.0–34.0)
MCH: 31.6 pg (ref 26.0–34.0)
MCHC: 33.3 g/dL (ref 30.0–36.0)
MCHC: 31.5 g/dL (ref 30.0–36.0)
MCV: 98.9 fL (ref 80.0–100.0)
MCV: 100.4 fL — ABNORMAL HIGH (ref 80.0–100.0)
Platelets: 214 10*3/uL (ref 150–400)
Platelets: 221 10*3/uL (ref 150–400)
RBC: 2.82 MIL/uL — ABNORMAL LOW (ref 3.87–5.11)
RBC: 2.72 MIL/uL — ABNORMAL LOW (ref 3.87–5.11)
RDW: 15.4 % (ref 11.5–15.5)
RDW: 15.6 % — ABNORMAL HIGH (ref 11.5–15.5)
WBC: 5.3 10*3/uL (ref 4.0–10.5)
WBC: 4.7 10*3/uL (ref 4.0–10.5)
nRBC: 0 % (ref 0.0–0.2)
nRBC: 0 % (ref 0.0–0.2)

## 2023-07-13 LAB — BASIC METABOLIC PANEL
Anion gap: 7 (ref 5–15)
BUN: 9 mg/dL (ref 8–23)
CO2: 27 mmol/L (ref 22–32)
Calcium: 7.8 mg/dL — ABNORMAL LOW (ref 8.9–10.3)
Chloride: 99 mmol/L (ref 98–111)
Creatinine, Ser: 1.4 mg/dL — ABNORMAL HIGH (ref 0.44–1.00)
GFR, Estimated: 40 mL/min — ABNORMAL LOW (ref >60)
Glucose, Bld: 74 mg/dL (ref 70–99)
Potassium: 3 mmol/L — ABNORMAL LOW (ref 3.5–5.1)
Sodium: 133 mmol/L — ABNORMAL LOW (ref 135–145)

## 2023-07-13 LAB — LIPASE, BLOOD: Lipase: 33 U/L (ref 11–51)

## 2023-07-13 LAB — CBG MONITORING, ED: Glucose-Capillary: 64 mg/dL — ABNORMAL LOW (ref 70–99)

## 2023-07-13 LAB — MAGNESIUM: Magnesium: 1.4 mg/dL — ABNORMAL LOW (ref 1.7–2.4)

## 2023-07-13 LAB — C DIFFICILE QUICK SCREEN W PCR REFLEX
C Diff antigen: POSITIVE — AB
C Diff interpretation: DETECTED
C Diff toxin: POSITIVE — AB

## 2023-07-13 MED ORDER — POTASSIUM CHLORIDE CRYS ER 20 MEQ PO TBCR
40.0000 meq | EXTENDED_RELEASE_TABLET | Freq: Once | ORAL | Status: AC
  Administered 2023-07-13: 40 meq via ORAL
  Filled 2023-07-13: qty 4

## 2023-07-13 MED ORDER — ONDANSETRON HCL 4 MG/2ML IJ SOLN
4.0000 mg | Freq: Once | INTRAMUSCULAR | Status: AC
  Administered 2023-07-13: 4 mg via INTRAVENOUS
  Filled 2023-07-13: qty 2

## 2023-07-13 MED ORDER — INSULIN ASPART 100 UNIT/ML IJ SOLN
0.0000 [IU] | Freq: Every day | INTRAMUSCULAR | Status: DC
Start: 2023-07-13 — End: 2023-07-16
  Filled 2023-07-13: qty 0.05

## 2023-07-13 MED ORDER — ATENOLOL 25 MG PO TABS
100.0000 mg | ORAL_TABLET | Freq: Every day | ORAL | Status: DC
  Administered 2023-07-16: 100 mg via ORAL
  Filled 2023-07-13 (×2): qty 4
  Filled 2023-07-13: qty 2

## 2023-07-13 MED ORDER — POTASSIUM CHLORIDE 10 MEQ/100ML IV SOLN
10.0000 meq | INTRAVENOUS | Status: AC
  Administered 2023-07-14 (×3): 10 meq via INTRAVENOUS
  Filled 2023-07-13 (×3): qty 100

## 2023-07-13 MED ORDER — ACETAMINOPHEN 650 MG RE SUPP: 650.0000 mg | Freq: Four times a day (QID) | RECTAL | Status: DC | PRN

## 2023-07-13 MED ORDER — SODIUM CHLORIDE 0.9% FLUSH
3.0000 mL | Freq: Two times a day (BID) | INTRAVENOUS | Status: DC
  Administered 2023-07-13 – 2023-07-16 (×4): 3 mL via INTRAVENOUS

## 2023-07-13 MED ORDER — INSULIN ASPART 100 UNIT/ML IJ SOLN
0.0000 [IU] | Freq: Three times a day (TID) | INTRAMUSCULAR | Status: DC
  Filled 2023-07-13: qty 0.15

## 2023-07-13 MED ORDER — LACTATED RINGERS IV BOLUS
1000.0000 mL | Freq: Once | INTRAVENOUS | Status: AC
  Administered 2023-07-13: 1000 mL via INTRAVENOUS

## 2023-07-13 MED ORDER — POLYETHYLENE GLYCOL 3350 17 G PO PACK: 17.0000 g | PACK | Freq: Every day | ORAL | Status: DC | PRN

## 2023-07-13 MED ORDER — POTASSIUM CHLORIDE 10 MEQ/100ML IV SOLN
10.0000 meq | Freq: Once | INTRAVENOUS | Status: AC
Start: 2023-07-13 — End: 2023-07-13
  Administered 2023-07-13: 10 meq via INTRAVENOUS
  Filled 2023-07-13: qty 100

## 2023-07-13 MED ORDER — VANCOMYCIN HCL 125 MG PO CAPS
125.0000 mg | ORAL_CAPSULE | Freq: Four times a day (QID) | ORAL | Status: DC
  Administered 2023-07-13: 125 mg via ORAL
  Filled 2023-07-13 (×5): qty 1

## 2023-07-13 MED ORDER — MAGNESIUM SULFATE 2 GM/50ML IV SOLN
2.0000 g | Freq: Once | INTRAVENOUS | Status: AC
  Administered 2023-07-13: 2 g via INTRAVENOUS
  Filled 2023-07-13: qty 50

## 2023-07-13 MED ORDER — SODIUM CHLORIDE 0.9 % IV SOLN
1.0000 g | Freq: Once | INTRAVENOUS | Status: AC
  Administered 2023-07-13: 1 g via INTRAVENOUS
  Filled 2023-07-13: qty 10

## 2023-07-13 MED ORDER — MAGNESIUM SULFATE 50 % IJ SOLN: 2.0000 g | Freq: Once | INTRAMUSCULAR | Status: DC

## 2023-07-13 MED ORDER — ENOXAPARIN SODIUM 40 MG/0.4ML IJ SOSY: 40.0000 mg | PREFILLED_SYRINGE | INTRAMUSCULAR | Status: DC

## 2023-07-13 MED ORDER — KCL-LACTATED RINGERS-D5W 20 MEQ/L IV SOLN
INTRAVENOUS | Status: DC
  Filled 2023-07-13 (×2): qty 1000

## 2023-07-13 MED ORDER — SODIUM CHLORIDE 0.9 % IV SOLN
1.0000 g | INTRAVENOUS | Status: DC
  Administered 2023-07-14 – 2023-07-16 (×3): 1 g via INTRAVENOUS
  Filled 2023-07-13 (×3): qty 10

## 2023-07-13 MED ORDER — ACETAMINOPHEN 325 MG PO TABS: 650.0000 mg | ORAL_TABLET | Freq: Four times a day (QID) | ORAL | Status: DC | PRN

## 2023-07-13 MED ORDER — ROSUVASTATIN CALCIUM 10 MG PO TABS
40.0000 mg | ORAL_TABLET | Freq: Every day | ORAL | Status: DC
  Administered 2023-07-15 – 2023-07-16 (×2): 40 mg via ORAL
  Filled 2023-07-13 (×2): qty 4
  Filled 2023-07-13: qty 2

## 2023-07-13 MED ORDER — METRONIDAZOLE 500 MG/100ML IV SOLN
500.0000 mg | Freq: Two times a day (BID) | INTRAVENOUS | Status: DC
  Administered 2023-07-13 – 2023-07-14 (×3): 500 mg via INTRAVENOUS
  Filled 2023-07-13 (×3): qty 100

## 2023-07-13 NOTE — ED Notes (Signed)
Pt wants her port accessed  

## 2023-07-13 NOTE — ED Provider Notes (Signed)
[facility] AT [facility] Provider Note   CSN: [id number] Arrival date & time: 07/13/23  1659     History  Chief Complaint  Patient presents with   Emesis    Misty Deleon is a 72 y.o. female.  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  Patient states that while she was in the hospital she was having nausea, vomiting and diarrhea.  She states that while her nausea resolved she never was able to eat much before being discharged.  She states that since she has been home her nausea and vomiting has returned and that she has been unable to keep anything down today.  She states that she has had 7 episodes of watery diarrhea today.  She denies any black or bloody stools.  She denies any abdominal pain and states that she still has mild dysuria though it has significantly improved.  She states that she still been having fevers since she has been home.  She states that she was unable to keep down her antibiotics today.  The history is provided by the patient.  Emesis      Home Medications Prior to Admission medications   Medication Sig Start Date End Date Taking? Authorizing Provider  antiseptic oral rinse (BIOTENE) LIQD 15 mLs by Mouth Rinse route as needed for dry mouth. 07/11/23   Marinda Elk, MD  atenolol (TENORMIN) 100 MG tablet Take 1 tablet (100 mg total) by mouth daily. Take in the morning 05/22/23   Artis Delay, MD  calcium carbonate (TUMS - DOSED IN MG ELEMENTAL CALCIUM) 500 MG chewable tablet Chew 1 tablet by mouth daily.    [provider]  cephALEXin (KEFLEX) 500 MG capsule Take 1 capsule (500 mg total) by mouth every 12 (twelve) hours for 10 days. 07/11/23 07/21/23  Marinda Elk, MD  cholecalciferol (VITAMIN D3) 25 MCG (1000 UNIT) tablet Take 2,000 Units by mouth daily.    [provider]  diphenhydrAMINE (BENADRYL ALLERGY) 25 MG tablet Take 2 tablets (50 mg total) by mouth every 6 (six) hours as needed for allergies. 01/05/23   Simonne Martinet, NP  ezetimibe (ZETIA) 10 MG tablet Take 10 mg by mouth in the morning.    [provider]  famotidine (PEPCID) 20 MG tablet Take 1 tablet (20 mg total) by mouth daily for 5 days. 01/05/23 01/10/23  Simonne Martinet, NP  glimepiride (AMARYL) 1 MG tablet Take 1 mg by mouth daily with breakfast. 07/07/22   [provider]  lidocaine-prilocaine (EMLA) cream Apply 1 Application topically as needed (for port access). 06/12/23   Artis Delay, MD  losartan (COZAAR) 50 MG tablet TAKE 1 TABLET(50 MG) BY MOUTH EVERY EVENING 06/12/23   Bertis Ruddy, Ni, MD  ondansetron (ZOFRAN) 8 MG tablet Take 1 tablet (8 mg total) by mouth every 8 (eight) hours as needed for nausea or vomiting. Start on the third day after chemotherapy. 09/01/22   Artis Delay, MD  phenazopyridine (PYRIDIUM) 200 MG tablet Take 1 tablet (200 mg total) by mouth 3 (three) times daily with meals. 07/11/23   Marinda Elk, MD  prochlorperazine (COMPAZINE) 10 MG tablet Take 1 tablet (10 mg total) by mouth every 6 (six) hours as needed for nausea or vomiting. 09/01/22  Artis Delay, MD  rosuvastatin (CRESTOR) 40 MG tablet Take 40 mg by mouth at bedtime.    [provider]  sitaGLIPtin (JANUVIA) 100 MG tablet Take 50 mg by mouth in the morning. 03/30/22   [provider]  traMADol (ULTRAM) 50 MG tablet Take 2 tablets (100 mg total) by mouth every 6 (six) hours as needed. Patient taking differently: Take 100 mg by mouth every 6 (six) hours as needed for moderate pain (pain score 4-6). 07/03/23   Artis Delay, MD  TYLENOL 500 MG tablet Take 500-1,000 mg by mouth every 6 (six) hours as needed for mild pain or headache.    [provider]  vitamin B-12 (CYANOCOBALAMIN) 1000 MCG tablet Take 1,000 mcg by mouth in the morning.    [provider]      Allergies    Lisinopril    Review of Systems   Review of Systems  Gastrointestinal:  Positive for vomiting.    Physical Exam Updated Vital Signs BP (!) 167/60   Pulse 71   Temp 98.9 F (37.2 C) (Oral)   Resp 13   Ht 5\' 2"  (1.575 m)   Wt 89.8 kg   SpO2 92%   BMI 36.21 kg/m  Physical Exam Vitals and nursing note reviewed.  Constitutional:      General: She is not in acute distress.    Appearance: Normal appearance.  HENT:     Head: Normocephalic and atraumatic.     Nose: Nose normal.     Mouth/Throat:     Mouth: Mucous membranes are dry.     Pharynx: Oropharynx is clear.  Eyes:     Extraocular Movements: Extraocular movements intact.     Conjunctiva/sclera: Conjunctivae normal.  Cardiovascular:     Rate and Rhythm: Normal rate and regular rhythm.     Heart sounds: Normal heart sounds.  Pulmonary:     Effort: Pulmonary effort is normal.     Breath sounds: Normal breath sounds.  Abdominal:     General: Abdomen is flat.     Palpations: Abdomen is soft.     Tenderness: There is no abdominal tenderness. There is no right CVA tenderness or left CVA tenderness.  Musculoskeletal:        General: Normal range of motion.     Cervical back: Normal range of motion.  Skin:    General: Skin is warm and dry.  Neurological:     General: No focal deficit present.     Mental Status: She is alert and oriented to person, place, and time.  Psychiatric:        Mood and Affect: Mood normal.        Behavior: Behavior normal.     ED Results / Procedures / Treatments   Labs (all labs ordered are listed, but only abnormal results are displayed) Labs Reviewed  C DIFFICILE QUICK SCREEN W PCR REFLEX   - Abnormal; Notable for the following components:      Result Value   C Diff antigen POSITIVE (*)    C Diff toxin POSITIVE (*)    All other components within  normal limits  COMPREHENSIVE METABOLIC PANEL - Abnormal; Notable for the following components:   Sodium 133 (*)    Potassium 2.9 (*)    Chloride 95 (*)    Creatinine, Ser 1.57 (*)    Calcium 8.4 (*)    Total Protein 5.6 (*)    Albumin 2.5 (*)    GFR, Estimated 35 (*)  All other components within normal limits  CBC - Abnormal; Notable for the following components:   RBC 2.82 (*)    Hemoglobin 9.3 (*)    HCT 27.9 (*)    All other components within normal limits  URINALYSIS, ROUTINE W REFLEX MICROSCOPIC - Abnormal; Notable for the following components:   Color, Urine AMBER (*)    APPearance HAZY (*)    Hgb urine dipstick MODERATE (*)    Protein, ur 30 (*)    Nitrite POSITIVE (*)    Leukocytes,Ua MODERATE (*)    Bacteria, UA RARE (*)    All other components within normal limits  MAGNESIUM - Abnormal; Notable for the following components:   Magnesium 1.4 (*)    All other components within normal limits  GASTROINTESTINAL PANEL BY PCR, STOOL (REPLACES STOOL CULTURE)  URINE CULTURE  LIPASE, BLOOD    EKG None  Radiology No results found.  Procedures Procedures    Medications Ordered in ED Medications  potassium chloride 10 mEq in 100 mL IVPB (10 mEq Intravenous New Bag/Given 07/13/23 2021)  magnesium sulfate IVPB 2 g 50 mL (has no administration in time range)  vancomycin (VANCOCIN) capsule 125 mg (has no administration in time range)  cefTRIAXone (ROCEPHIN) 1 g in sodium chloride 0.9 % 100 mL IVPB (has no administration in time range)  ondansetron (ZOFRAN) injection 4 mg (4 mg Intravenous Given 07/13/23 1823)  lactated ringers bolus 1,000 mL (0 mLs Intravenous Stopped 07/13/23 1951)  potassium chloride SA (KLOR-CON M) CR tablet 40 mEq (40 mEq Oral Given 07/13/23 2022)    ED Course/ Medical Decision Making/ A&P Clinical Course as of 07/13/23 2103  Fri Jul 13, 2023  1950 Hypokalemia and hypomagnesemia will be repleted. Mild AKI, hgb at baseline.  [VK]  2051 C diff is  positive will be started on oral vancomycin.  Urine is still positive for UTI, was on Keflex at home and has been unable to tolerate oral meds and will be given Rocephin.  Patient will require admission for her dehydration in the setting of her C. difficile and UTI. [VK]    Clinical Course User Index [VK] Rexford Maus, DO                                 Medical Decision Making This patient presents to the ED with chief complaint(s) of N/V/D with pertinent past medical history of cervical cancer on immunotherapy, chronic MAC, which further complicates the presenting complaint. The complaint involves an extensive differential diagnosis and also carries with it a high risk of complications and morbidity.    The differential diagnosis includes dehydration, electrolyte abnormality, UTI, sepsis, patient does have chronic diarrhea but considering C. difficile with recent hospitalization and antibiotic use or other infectious diarrhea  Additional history obtained: Additional history obtained from family Records reviewed previous admission documents  ED Course and Reassessment: On patient's arrival she is hemodynamically stable in no acute distress, mildly dry appearing on exam, no signs of sepsis by vital signs.  Patient will have labs to evaluate for electrolyte derangement, hepatitis or pancreatitis we will repeat urine.  Stool studies will be ordered if she has diarrhea while she is here.  Will be started on fluids and Zofran for symptomatic management and will be closely reassessed.  Independent labs interpretation:  The following labs were independently interpreted: Hypokalemia, mild AKI, mild hypomagnesemia, UA positive for UTI, C. difficile positive  Independent  visualization of imaging: - N/A  Consultation: - Consulted or discussed management/test interpretation w/ external professional: hospitalist  Consideration for admission or further workup: patient requires admission for  treatment of dehydration in the setting of C-diff and UTI Social Determinants of health: N/A    Amount and/or Complexity of Data Reviewed Labs: ordered.  Risk Prescription drug management. Decision regarding hospitalization.          Final Clinical Impression(s) / ED Diagnoses Final diagnoses:  C. difficile diarrhea  Acute cystitis with hematuria  AKI (acute kidney injury) (HCC)  Hypokalemia    Rx / DC Orders ED Discharge Orders     None         Rexford Maus, DO 07/13/23 2103

## 2023-07-13 NOTE — Assessment & Plan Note (Signed)
Chornic diagnosiss. Hold antihtn regimen tonight given DBP at 60. Restart in AM as tolerated.

## 2023-07-13 NOTE — Assessment & Plan Note (Addendum)
Patient has acute kidney injury with creatinine of 1.57 compared to baseline below 1.2.  This is likely prerenal due to patient's diarrhea and vomiting at home.  Check urine sodium and creatinine. S/p 1 liter LR. C.w. 100cc/hr of LR+KCL

## 2023-07-13 NOTE — ED Triage Notes (Signed)
Patient arrives in wheelchair by POV with spouse c/o emesis and feeling unwell since being discharged from hospital on 30th. Spouse states patient was discharged too soon. States she cannot keep her antibiotics down and having fevers at home. States they are supposed to follow up with urology for UTI but do not have appt yet.

## 2023-07-13 NOTE — H&P (Addendum)
History and Physical    Patient: Misty Deleon ZDG:[id number] DOB: [birth year] DOA: 07/13/2023 DOS: the patient was seen and examined on 07/13/2023 PCP: Marletta Lor, NP  Patient coming from: Home  Chief Complaint:  Chief Complaint  Patient presents with   Emesis   HPI: Misty Deleon is a 72 y.o. female with medical history significant of cervical cancer for which patient is currently on immunotherapy.  Patient reports that she was in her usual state of health till approximately October 15 at which time she had new onset of dysuria.  Patient finished 5 days of oral antibiotic course at that time.  However patient had persistent dysuria for which patient was prescribed another couple of days of oral antibiotic.  However culture had been drawn of the urine apparently prior to starting the second course of oral antibiotic which turned back negative.  Therefore patient's antibiotic course was interrupted and patient was instead scheduled for cystoscopy.  This is as per the patient's history, as well as husband at the bedside.  Unfortunately prior to patient getting her cystoscopy, on or approximately October 23, patient reports starting to have vomiting, some abdominal discomfort, fever as well as diarrhea up to 3 times a day.  Patient reports coming to the ER on October 25 when she was hospitalized for urinary tract infection.  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  An infectious etiology for the same was felt to be unlikely at the time.  Patient reports that she improved somewhat with IV antibiotic therapy in the hospital.  However she claims she continued to have diarrhea and several episodes of vomiting in the hospital.  She was discharged on October 30, 2 days ago.  Patient reports that since discharge she is still having vomiting up to 2-3 times a day, similar numbers of diarrhea and diffuse mild crampy abdominal pain.  Her temperature was 100.8 F yesterday.  Patient C.  difficile testing is positive in the ER.  Medical evaluation is sought.  Patient denies any cough shortness of breath.  Patient still has dysuria, although it is helped by Pyridium Review of Systems: As mentioned in the history of present illness. All other systems reviewed and are negative. Past Medical History:  Diagnosis Date   CKD (chronic kidney disease), stage III (HCC)    Deficiency anemia    B12 def.  treated with b12 injection and has received iron infusion's   GERD (gastroesophageal reflux disease)    History of radiation therapy 09/22/20-11/01/20   Cervix, IMRT    Dr. Antony Blackbird   History of radiation therapy 11/09/2020-12/08/2020   cervix, intracavitary brachytherapy   Dr Antony Blackbird   Hypercholesteremia    Hypertension    Hypomagnesemia    Malignant neoplasm cervix [facility]) oncologist--- dr gorsuch/  radiation oncology--- dr Roselind Messier   dx 12/ 2021,  Stage IIIC-1,  with pelvic adeopathy;  concurrent chemo/ radiation;  chemo started 09-20-2020 and pt completed IMRT 11-01-2020 / scheduled for high dose brachytherapy to start 11-09-2020   PCOS (polycystic ovarian syndrome)    PMB (postmenopausal bleeding)    Type 2 diabetes mellitus (HCC)    followed by pc   (11-04-2020 pt stated does not check blood sugar)   Wears contact lenses    Past Surgical History:  Procedure Laterality Date   CESAREAN SECTION  x2 last one 1977   IR IMAGING GUIDED PORT INSERTION  09/16/2020   OPERATIVE ULTRASOUND N/A 11/09/2020   Procedure: OPERATIVE ULTRASOUND;  Surgeon: Antony Blackbird,  MD;  Location: [facility];  Service: Urology;  Laterality: N/A;   OPERATIVE ULTRASOUND N/A 11/16/2020   Procedure: OPERATIVE ULTRASOUND;  Surgeon: Antony Blackbird, MD;  Location: [facility];  Service: Urology;  Laterality: N/A;   OPERATIVE ULTRASOUND N/A 11/25/2020   Procedure: OPERATIVE ULTRASOUND;  Surgeon: Antony Blackbird, MD;  Location: [facility];  Service: Urology;  Laterality: N/A;    OPERATIVE ULTRASOUND N/A 11/29/2020   Procedure: OPERATIVE ULTRASOUND;  Surgeon: Antony Blackbird, MD;  Location: [facility];  Service: Urology;  Laterality: N/A;   OPERATIVE ULTRASOUND N/A 12/08/2020   Procedure: OPERATIVE ULTRASOUND;  Surgeon: Antony Blackbird, MD;  Location: [facility];  Service: Urology;  Laterality: N/A;   TANDEM RING INSERTION N/A 11/09/2020   Procedure: TANDEM RING INSERTION;  Surgeon: Antony Blackbird, MD;  Location: [facility];  Service: Urology;  Laterality: N/A;   TANDEM RING INSERTION N/A 11/16/2020   Procedure: TANDEM RING INSERTION;  Surgeon: Antony Blackbird, MD;  Location: [facility];  Service: Urology;  Laterality: N/A;   TANDEM RING INSERTION N/A 11/25/2020   Procedure: TANDEM RING INSERTION;  Surgeon: Antony Blackbird, MD;  Location: [facility];  Service: Urology;  Laterality: N/A;   TANDEM RING INSERTION N/A 11/29/2020   Procedure: TANDEM RING INSERTION;  Surgeon: Antony Blackbird, MD;  Location: [facility];  Service: Urology;  Laterality: N/A;   TANDEM RING INSERTION N/A 12/08/2020   Procedure: TANDEM RING INSERTION;  Surgeon: Antony Blackbird, MD;  Location: [facility];  Service: Urology;  Laterality: N/A;   WISDOM TOOTH EXTRACTION  1980s   Social History:  reports that she has never smoked. She has never used smokeless tobacco. She reports that she does not drink alcohol and does not use drugs.  Allergies  Allergen Reactions   Lisinopril Swelling and Other (See Comments)    Entire tongue became swollen and the exterior front of the throat. No shortness of breath noted.    Family History  Problem Relation Age of Onset   Diabetes Father    Brain cancer Son    Breast cancer Neg Hx    Colon cancer Neg Hx    Ovarian cancer Neg Hx    Endometrial cancer Neg Hx    Prostate cancer Neg Hx    Pancreatic cancer Neg Hx     Prior to Admission medications   Medication Sig  Start Date End Date Taking? Authorizing Provider  antiseptic oral rinse (BIOTENE) LIQD 15 mLs by Mouth Rinse route as needed for dry mouth. 07/11/23   Marinda Elk, MD  atenolol (TENORMIN) 100 MG tablet Take 1 tablet (100 mg total) by mouth daily. Take in the morning 05/22/23   Artis Delay, MD  calcium carbonate (TUMS - DOSED IN MG ELEMENTAL CALCIUM) 500 MG chewable tablet Chew 1 tablet by mouth daily.    [provider]  cephALEXin (KEFLEX) 500 MG capsule Take 1 capsule (500 mg total) by mouth every 12 (twelve) hours for 10 days. 07/11/23 07/21/23  Marinda Elk, MD  cholecalciferol (VITAMIN D3) 25 MCG (1000 UNIT) tablet Take 2,000 Units by mouth daily.    [provider]  diphenhydrAMINE (BENADRYL ALLERGY) 25 MG tablet Take 2 tablets (50 mg total) by mouth every 6 (six) hours as needed for allergies. 01/05/23   Simonne Martinet, NP  ezetimibe (ZETIA) 10 MG tablet Take 10 mg by mouth in the morning.    [provider]  famotidine (PEPCID) 20 MG tablet Take 1 tablet (20 mg total) by mouth daily for 5 days. 01/05/23 01/10/23  Simonne Martinet, NP  glimepiride (AMARYL) 1 MG tablet Take 1 mg by mouth daily with breakfast. 07/07/22   [provider]  lidocaine-prilocaine (EMLA) cream Apply 1 Application topically as needed (for port access). 06/12/23   Artis Delay, MD  losartan (COZAAR) 50 MG tablet TAKE 1 TABLET(50 MG) BY MOUTH EVERY EVENING 06/12/23   Bertis Ruddy, Ni, MD  ondansetron (ZOFRAN) 8 MG tablet Take 1 tablet (8 mg total) by mouth every 8 (eight) hours as needed for nausea or vomiting. Start on the third day after chemotherapy. 09/01/22   Artis Delay, MD  phenazopyridine (PYRIDIUM) 200 MG tablet Take 1 tablet (200 mg total) by mouth 3 (three) times daily with meals. 07/11/23   Marinda Elk, MD  prochlorperazine (COMPAZINE) 10 MG tablet Take 1 tablet (10 mg total) by mouth every 6 (six) hours as needed for nausea or vomiting. 09/01/22   Artis Delay,  MD  rosuvastatin (CRESTOR) 40 MG tablet Take 40 mg by mouth at bedtime.    [provider]  sitaGLIPtin (JANUVIA) 100 MG tablet Take 50 mg by mouth in the morning. 03/30/22   [provider]  traMADol (ULTRAM) 50 MG tablet Take 2 tablets (100 mg total) by mouth every 6 (six) hours as needed. Patient taking differently: Take 100 mg by mouth every 6 (six) hours as needed for moderate pain (pain score 4-6). 07/03/23   Artis Delay, MD  TYLENOL 500 MG tablet Take 500-1,000 mg by mouth every 6 (six) hours as needed for mild pain or headache.    [provider]  vitamin B-12 (CYANOCOBALAMIN) 1000 MCG tablet Take 1,000 mcg by mouth in the morning.    [provider]    Physical Exam: Vitals:   07/13/23 1708 07/13/23 1930  BP: 132/64 (!) 167/60  Pulse: 72 71  Resp: 18 13  Temp: 98.9 F (37.2 C)   TempSrc: Oral   SpO2: 95% 92%  Weight: 89.8 kg   Height: 5\' 2"  (1.575 m)    General: Patient is alert and awake does not appear to be distressed.  Husband at the bedside.  Patient gives a coherent account of her symptoms Respiratory exam: Bilateral intravesicular Cardiovascular exam S1-S2 normal Abdomen obese all quadrants soft nontender Extremities warm without edema. Data Reviewed:  Labs on Admission:  Results for orders placed or performed during the hospital encounter of 07/13/23 (from the past 24 hour(s))  Urinalysis, Routine w reflex microscopic -Urine, Clean Catch     Status: Abnormal   Collection Time: 07/13/23  5:11 PM  Result Value Ref Range   Color, Urine AMBER (A) YELLOW   APPearance HAZY (A) CLEAR   Specific Gravity, Urine 1.006 1.005 - 1.030   pH 5.0 5.0 - 8.0   Glucose, UA NEGATIVE NEGATIVE mg/dL   Hgb urine dipstick MODERATE (A) NEGATIVE   Bilirubin Urine NEGATIVE NEGATIVE   Ketones, ur NEGATIVE NEGATIVE mg/dL   Protein, ur 30 (A) NEGATIVE mg/dL   Nitrite POSITIVE (A) NEGATIVE   Leukocytes,Ua MODERATE (A) NEGATIVE   RBC / HPF 11-20 0 - 5  RBC/hpf   WBC, UA 11-20 0 - 5 WBC/hpf   Bacteria, UA RARE (A) NONE SEEN   Squamous Epithelial / HPF 11-20 0 - 5 /HPF  Lipase, blood     Status: None   Collection Time: 07/13/23  6:22 PM  Result Value  Ref Range   Lipase 33 11 - 51 U/L  Comprehensive metabolic panel     Status: Abnormal   Collection Time: 07/13/23  6:22 PM  Result Value Ref Range   Sodium 133 (L) 135 - 145 mmol/L   Potassium 2.9 (L) 3.5 - 5.1 mmol/L   Chloride 95 (L) 98 - 111 mmol/L   CO2 25 22 - 32 mmol/L   Glucose, Bld 80 70 - 99 mg/dL   BUN 10 8 - 23 mg/dL   Creatinine, Ser 4.50 (H) 0.44 - 1.00 mg/dL   Calcium 8.4 (L) 8.9 - 10.3 mg/dL   Total Protein 5.6 (L) 6.5 - 8.1 g/dL   Albumin 2.5 (L) 3.5 - 5.0 g/dL   AST 28 15 - 41 U/L   ALT 12 0 - 44 U/L   Alkaline Phosphatase 63 38 - 126 U/L   Total Bilirubin 0.6 0.3 - 1.2 mg/dL   GFR, Estimated 35 (L) >60 mL/min   Anion gap 13 5 - 15  CBC     Status: Abnormal   Collection Time: 07/13/23  6:22 PM  Result Value Ref Range   WBC 5.3 4.0 - 10.5 K/uL   RBC 2.82 (L) 3.87 - 5.11 MIL/uL   Hemoglobin 9.3 (L) 12.0 - 15.0 g/dL   HCT 38.8 (L) 82.8 - 00.3 %   MCV 98.9 80.0 - 100.0 fL   MCH 33.0 26.0 - 34.0 pg   MCHC 33.3 30.0 - 36.0 g/dL   RDW 49.1 79.1 - 50.5 %   Platelets 214 150 - 400 K/uL   nRBC 0.0 0.0 - 0.2 %  Magnesium     Status: Abnormal   Collection Time: 07/13/23  6:22 PM  Result Value Ref Range   Magnesium 1.4 (L) 1.7 - 2.4 mg/dL  C Difficile Quick Screen w PCR reflex     Status: Abnormal   Collection Time: 07/13/23  7:12 PM   Specimen: Urine, Clean Catch; Stool  Result Value Ref Range   C Diff antigen POSITIVE (A) NEGATIVE   C Diff toxin POSITIVE (A) NEGATIVE   C Diff interpretation Toxin producing C. difficile detected.    Basic Metabolic Panel: Recent Labs  Lab 07/07/23 1135 07/09/23 1206 07/13/23 1822  NA 130* 135 133*  K 4.0 3.4* 2.9*  CL 101 106 95*  CO2 23 23 25   GLUCOSE 159* 111* 80  BUN 6* 7* 10  CREATININE 0.99 1.24* 1.57*  CALCIUM  8.1* 7.5* 8.4*  MG  --   --  1.4*   Liver Function Tests: Recent Labs  Lab 07/13/23 1822  AST 28  ALT 12  ALKPHOS 63  BILITOT 0.6  PROT 5.6*  ALBUMIN 2.5*   Recent Labs  Lab 07/13/23 1822  LIPASE 33   No results for input(s): "AMMONIA" in the last 168 hours. CBC: Recent Labs  Lab 07/07/23 1135 07/13/23 1822  WBC 4.6 5.3  NEUTROABS 3.1  --   HGB 9.2* 9.3*  HCT 28.3* 27.9*  MCV 99.6 98.9  PLT 168 214   Cardiac Enzymes: No results for input(s): "CKTOTAL", "CKMB", "CKMBINDEX", "TROPONINIHS" in the last 168 hours.  BNP (last 3 results) No results for input(s): "PROBNP" in the last 8760 hours. CBG: Recent Labs  Lab 07/07/23 2109 07/08/23 0733 07/08/23 1156 07/08/23 1547 07/08/23 2131  GLUCAP 125* 84 95 87 105*    Radiological Exams on Admission:  No results found.     Assessment and Plan: * C. difficile colitis HPI for patient's symptomatology.  Patient has been started on oral vancomycin in the ER, agree with same, however given complaint of vomiting, at this time I will cover the patient with metronidazole as well.  Once the patient's vomiting is resolved I think patient could be treated with oral antibiotics at that stage.  Patient is not having any bloody emesis or diarrhea.  However I will get x-ray abdomen to make sure were not dealing with anything else.  UTI (urinary tract infection) Patient seems to have had initial onset of dysuria at approximately October 15 for which patient was prescribed Augmentin based on review of record.  However in spite of completing a course, patient's dysuria persisted, subsequent urine culture seems to have been negative.  Review of oncology note on or around October 22 suggests that patient's symptoms were attribute it to atrophic vaginitis and not due to urinary tract infection.  However because of patient having receptor positive tumor, she is not felt to be a candidate for estrogen therapy and plan was to treat with  topical lubricants and pain medication.  Patient said dysuria however has continued.  Patient was admitted to hospital with sepsis on October 25 with source of sepsis being attributed to urinary tract infection.  Patient was treated with ceftriaxone, and then transition to oral cephalexin.  Unfortunately patient has been throwing up the cephalexin at home.  And restarted on ceftriaxone here in the ER on July 13, 2023.  At this time I will finish ceftriaxone based on initial discharge plan from October 30.  Patient has approximately 8 more days of antibiotic to take.  AKI (acute kidney injury) (HCC) Patient has acute kidney injury with creatinine of 1.57 compared to baseline below 1.2.  This is likely prerenal due to patient's diarrhea and vomiting at home.  Check urine sodium and creatinine. S/p 1 liter LR. C.w. 100cc/hr of LR+KCL  Essential hypertension Chornic diagnosiss. Hold antihtn regimen tonight given DBP at 60. Restart in AM as tolerated.  Cervical cancer (HCC) Chornic diangosis. Patient has been getting bevacizumab. See onc note 07/03/2023   Pending pharmayc input for home med rec.   Advance Care Planning:   Code Status: Prior full code  Consults: none from the ER tonight. Consider expert input as needed in AM  Family Communication: husband at bedside. All questions answered.  Severity of Illness: The appropriate patient status for this patient is INPATIENT. Inpatient status is judged to be reasonable and necessary in order to provide the required intensity of service to ensure the patient's safety. The patient's presenting symptoms, physical exam findings, and initial radiographic and laboratory data in the context of their chronic comorbidities is felt to place them at high risk for further clinical deterioration. Furthermore, it is not anticipated that the patient will be medically stable for discharge from the hospital within 2 midnights of admission.   * I certify that at the  point of admission it is my clinical judgment that the patient will require inpatient hospital care spanning beyond 2 midnights from the point of admission due to high intensity of service, high risk for further deterioration and high frequency of surveillance required.*  Author: Nolberto Hanlon, MD 07/13/2023 9:52 PM  For on call review [email or web address].

## 2023-07-13 NOTE — Assessment & Plan Note (Signed)
HPI for patient's symptomatology.  Patient has been started on oral vancomycin in the ER, agree with same, however given complaint of vomiting, at this time I will cover the patient with metronidazole as well.  Once the patient's vomiting is resolved I think patient could be treated with oral antibiotics at that stage.  Patient is not having any bloody emesis or diarrhea.  However I will get x-ray abdomen to make sure were not dealing with anything else.

## 2023-07-13 NOTE — Assessment & Plan Note (Signed)
Chornic diangosis. Patient has been getting bevacizumab. See onc note 07/03/2023

## 2023-07-13 NOTE — Assessment & Plan Note (Signed)
Patient seems to have had initial onset of dysuria at approximately October 15 for which patient was prescribed Augmentin based on review of record.  However in spite of completing a course, patient's dysuria persisted, subsequent urine culture seems to have been negative.  Review of oncology note on or around October 22 suggests that patient's symptoms were attribute it to atrophic vaginitis and not due to urinary tract infection.  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  Patient said dysuria however has continued.  Patient was admitted to hospital with sepsis on October 25 with source of sepsis being attributed to urinary tract infection.  Patient was treated with ceftriaxone, and then transition to oral cephalexin.  Unfortunately patient has been throwing up the cephalexin at home.  And restarted on ceftriaxone here in the ER on July 13, 2023.  At this time I will finish ceftriaxone based on initial discharge plan from October 30.  Patient has approximately 8 more days of antibiotic to take.

## 2023-07-14 DIAGNOSIS — A0472 Enterocolitis due to Clostridium difficile, not specified as recurrent: Secondary | ICD-10-CM | POA: Diagnosis not present

## 2023-07-14 LAB — GASTROINTESTINAL PANEL BY PCR, STOOL (REPLACES STOOL CULTURE)

## 2023-07-14 LAB — BASIC METABOLIC PANEL
Anion gap: 9 (ref 5–15)
BUN: 7 mg/dL — ABNORMAL LOW (ref 8–23)
CO2: 22 mmol/L (ref 22–32)
Calcium: 7.3 mg/dL — ABNORMAL LOW (ref 8.9–10.3)
Chloride: 105 mmol/L (ref 98–111)
Creatinine, Ser: 1.44 mg/dL — ABNORMAL HIGH (ref 0.44–1.00)
GFR, Estimated: 39 mL/min — ABNORMAL LOW (ref >60)
Glucose, Bld: 228 mg/dL — ABNORMAL HIGH (ref 70–99)
Potassium: 4.3 mmol/L (ref 3.5–5.1)
Sodium: 136 mmol/L (ref 135–145)

## 2023-07-14 LAB — CBC
HCT: 27.5 % — ABNORMAL LOW (ref 36.0–46.0)
Hemoglobin: 8.7 g/dL — ABNORMAL LOW (ref 12.0–15.0)
MCH: 31.8 pg (ref 26.0–34.0)
MCHC: 31.6 g/dL (ref 30.0–36.0)
MCV: 100.4 fL — ABNORMAL HIGH (ref 80.0–100.0)
Platelets: 224 10*3/uL (ref 150–400)
RBC: 2.74 MIL/uL — ABNORMAL LOW (ref 3.87–5.11)
RDW: 15.7 % — ABNORMAL HIGH (ref 11.5–15.5)
WBC: 4.3 10*3/uL (ref 4.0–10.5)
nRBC: 0 % (ref 0.0–0.2)

## 2023-07-14 LAB — IRON AND TIBC
Iron: 29 ug/dL (ref 28–170)
Saturation Ratios: 22 % (ref 10.4–31.8)
TIBC: 132 ug/dL — ABNORMAL LOW (ref 250–450)
UIBC: 103 ug/dL

## 2023-07-14 LAB — MAGNESIUM: Magnesium: 1.7 mg/dL (ref 1.7–2.4)

## 2023-07-14 LAB — PROTIME-INR
INR: 1.2 (ref 0.8–1.2)
Prothrombin Time: 15.7 s — ABNORMAL HIGH (ref 11.4–15.2)

## 2023-07-14 LAB — PHOSPHORUS: Phosphorus: <1 mg/dL — CL (ref 2.5–4.6)

## 2023-07-14 LAB — CBG MONITORING, ED
Glucose-Capillary: 104 mg/dL — ABNORMAL HIGH (ref 70–99)
Glucose-Capillary: 124 mg/dL — ABNORMAL HIGH (ref 70–99)
Glucose-Capillary: 147 mg/dL — ABNORMAL HIGH (ref 70–99)

## 2023-07-14 LAB — VITAMIN D 25 HYDROXY (VIT D DEFICIENCY, FRACTURES): Vit D, 25-Hydroxy: 33.32 ng/mL (ref 30–100)

## 2023-07-14 LAB — APTT: aPTT: 34 s (ref 24–36)

## 2023-07-14 LAB — FOLATE: Folate: 8.2 ng/mL (ref >5.9)

## 2023-07-14 MED ORDER — PANTOPRAZOLE SODIUM 40 MG PO TBEC
40.0000 mg | DELAYED_RELEASE_TABLET | Freq: Every day | ORAL | Status: DC
  Filled 2023-07-14: qty 1

## 2023-07-14 MED ORDER — MEGESTROL ACETATE 40 MG PO TABS: 40.0000 mg | ORAL_TABLET | Freq: Two times a day (BID) | ORAL | Status: DC

## 2023-07-14 MED ORDER — BENZOCAINE 10 % MT GEL: Freq: Three times a day (TID) | OROMUCOSAL | Status: DC

## 2023-07-14 MED ORDER — SACCHAROMYCES BOULARDII 250 MG PO CAPS
250.0000 mg | ORAL_CAPSULE | Freq: Two times a day (BID) | ORAL | Status: DC
  Administered 2023-07-14 – 2023-07-15 (×3): 250 mg via ORAL
  Filled 2023-07-14 (×4): qty 1

## 2023-07-14 MED ORDER — BENZOCAINE 10 % MT GEL
Freq: Three times a day (TID) | OROMUCOSAL | Status: DC
  Filled 2023-07-14: qty 9.4

## 2023-07-14 MED ORDER — ENOXAPARIN SODIUM 40 MG/0.4ML IJ SOSY
40.0000 mg | PREFILLED_SYRINGE | INTRAMUSCULAR | Status: DC
  Filled 2023-07-14 (×2): qty 0.4

## 2023-07-14 MED ORDER — SODIUM CHLORIDE 0.9 % IV SOLN
INTRAVENOUS | Status: AC
Start: 2023-07-14 — End: 2023-07-15

## 2023-07-14 MED ORDER — MEGESTROL ACETATE 400 MG/10ML PO SUSP
400.0000 mg | Freq: Two times a day (BID) | ORAL | Status: DC
  Administered 2023-07-14 – 2023-07-16 (×5): 400 mg via ORAL
  Filled 2023-07-14 (×5): qty 10

## 2023-07-14 MED ORDER — PANTOPRAZOLE SODIUM 40 MG IV SOLR
40.0000 mg | Freq: Every day | INTRAVENOUS | Status: AC
  Administered 2023-07-14 – 2023-07-15 (×2): 40 mg via INTRAVENOUS
  Filled 2023-07-14 (×2): qty 10

## 2023-07-14 MED ORDER — POTASSIUM PHOSPHATES 15 MMOLE/5ML IV SOLN
30.0000 mmol | Freq: Once | INTRAVENOUS | Status: AC
  Administered 2023-07-14: 30 mmol via INTRAVENOUS
  Filled 2023-07-14: qty 10

## 2023-07-14 MED ORDER — BENZOCAINE 10 % MT GEL: Freq: Three times a day (TID) | OROMUCOSAL | Status: DC | PRN

## 2023-07-14 MED ORDER — PANTOPRAZOLE SODIUM 40 MG PO TBEC
40.0000 mg | DELAYED_RELEASE_TABLET | Freq: Every day | ORAL | Status: DC
  Administered 2023-07-16: 40 mg via ORAL
  Filled 2023-07-14: qty 1

## 2023-07-14 MED ORDER — GLUCERNA SHAKE PO LIQD
237.0000 mL | Freq: Three times a day (TID) | ORAL | Status: DC
Start: 2023-07-14 — End: 2023-07-16
  Administered 2023-07-14 (×3): 237 mL via ORAL
  Filled 2023-07-14 (×9): qty 237

## 2023-07-14 MED ORDER — BENZOCAINE 10 % MT GEL: Freq: Four times a day (QID) | OROMUCOSAL | Status: DC | PRN

## 2023-07-14 MED ORDER — CHLORHEXIDINE GLUCONATE CLOTH 2 % EX PADS
6.0000 | MEDICATED_PAD | Freq: Every day | CUTANEOUS | Status: DC
  Administered 2023-07-15 – 2023-07-16 (×2): 6 via TOPICAL

## 2023-07-14 MED ORDER — ONDANSETRON HCL 4 MG/2ML IJ SOLN: 4.0000 mg | Freq: Four times a day (QID) | INTRAMUSCULAR | Status: DC | PRN

## 2023-07-14 MED ORDER — MAGIC MOUTHWASH W/LIDOCAINE
5.0000 mL | Freq: Four times a day (QID) | ORAL | Status: DC
  Administered 2023-07-14 – 2023-07-16 (×6): 5 mL via ORAL
  Filled 2023-07-14 (×8): qty 5

## 2023-07-14 MED ORDER — VANCOMYCIN HCL 125 MG PO CAPS
125.0000 mg | ORAL_CAPSULE | Freq: Four times a day (QID) | ORAL | Status: DC
  Administered 2023-07-14 – 2023-07-16 (×7): 125 mg via ORAL
  Filled 2023-07-14 (×9): qty 1

## 2023-07-14 MED ORDER — NYSTATIN 100000 UNIT/ML MT SUSP
5.0000 mL | Freq: Three times a day (TID) | OROMUCOSAL | Status: DC
  Administered 2023-07-14 – 2023-07-15 (×2): 500000 [IU] via ORAL
  Filled 2023-07-14 (×4): qty 5

## 2023-07-14 NOTE — Progress Notes (Signed)
[facility] Progress Note  Patient: Misty Deleon    ZOX:[id number]  DOA: 07/13/2023     Date of Service: the patient was seen and examined on 07/14/2023  Chief Complaint  Patient presents with   Emesis   Brief hospital course: Misty Deleon is a 72 y.o. female with PMH of cervical cancer on immunotherapy. On 10/15 pt had dysuria s/p Abx for 5 days. Pt had persistent dysuria, prescribed another course of antibiotics but urine culture was negative so antibiotics were stopped and patient was scheduled for cystoscopy by urologist but patient got sick again on 10/23rd, so cystoscopy could not be done, patient was admitted on 10/25 for infection, treated with IV antibiotics.  Patient developed diarrhea during hospital stay but she was discharged without testing.  Her condition did not improve and had crampy abdominal pain with diarrhea and fever.  Patient was tested positive for C. difficile and admitted for further management as below.   Assessment and Plan:  C. Difficile diarrhea Continue NS 75 mL/h for IV hydration Continue vancomycin 125 mg p.o. every 6 hourly for 10 days and patient was started on Flagyl 500 mg IV every 12 hourly which has been continued, it can be de-escalated after improvement. Started probiotics Continue PPI for GI prophylaxis Continue soft diet Consider ID consult if no improvement Low appetite, started Megace  Recurrent UTI UA: LE moderate, nitrite positive, bacteria rare, WBC 11-20 Continue ceftriaxone Follow urine culture and de-escalate accordingly  AKI secondary to dehydration, prerenal Baseline creatinine 0.99 on 07/07/23 Creatinine 1.57 on admission, Cr 1.44, continue to monitor Continue IV fluid for hydration and monitor renal functions   HTN, HLD BP soft, continued atenolol 100 mg p.o. daily with holding parameters Held losartan Continue Crestor Monitor BP and titrate medications accordingly  NIDDM T2 Held home medications for  now Continue Humalog sliding scale, monitor CBG Change to diabetic diet after improvement  Mouth soreness, started oral gel, and nystatin swish and swallow 3 times daily for 3 days  Cervical cancer Chornic diangosis. Patient has been getting bevacizumab. See onc note 07/03/2023  Body mass index is 36.21 kg/m.  Interventions:  Diet: soft diet DVT Prophylaxis: Subcutaneous Lovenox   Advance goals of care discussion: Full code  Family Communication: family was present at bedside, at the time of interview.  The pt provided permission to discuss medical plan with the family. Opportunity was given to ask question and all questions were answered satisfactorily.   Disposition:  Pt is from Home, admitted with C. difficile diarrhea and recurrent UTI, still has diarrhea and on IV antibiotics, which precludes a safe discharge. Discharge to Home, when stable, may need few days to improve.  Subjective: No significant events overnight, patient denies any abdominal pain but had abdominal cramps during diarrhea, 2 episodes today, small amount.  Denies any vomiting, had nausea which improved with medication.  Very decreased appetite, less p.o. intake in the past 2 and half weeks.  Mouth is sore and raw. Denies any chest pain or palpitation, no shortness of breath.  Physical Exam: General: NAD, lying comfortably Appear in no distress, affect appropriate Eyes: PERRLA ENT: Oral Mucosa Clear, moist  Neck: no JVD,  Cardiovascular: S1 and S2 Present, no Murmur,  Respiratory: good respiratory effort, Bilateral Air entry equal and Decreased, no Crackles, no wheezes Abdomen: Bowel Sound present, Soft and no tenderness,  Skin: no rashes Extremities: no Pedal edema, no calf tenderness Neurologic: without any new focal findings Gait not checked due to  patient safety concerns  Vitals:   07/14/23 1138 07/14/23 1200 07/14/23 1217 07/14/23 1351  BP:  (!) 120/56 (!) 120/56 (!) 120/42  Pulse:   73 69   Resp:  (!) 23 16 16   Temp: 98.6 F (37 C)   98.8 F (37.1 C)  TempSrc: Oral   Oral  SpO2:   98% 98%  Weight:      Height:        Intake/Output Summary (Last 24 hours) at 07/14/2023 1414 Last data filed at 07/14/2023 0456 Gross per 24 hour  Intake 1450 ml  Output --  Net 1450 ml   Filed Weights   07/13/23 1708  Weight: 89.8 kg    Data Reviewed: I have personally reviewed and interpreted daily labs, tele strips, imagings as discussed above. I reviewed all nursing notes, pharmacy notes, vitals, pertinent old records I have discussed plan of care as described above with RN and patient/family.  CBC: Recent Labs  Lab 07/13/23 1822 07/13/23 2305 07/14/23 0455  WBC 5.3 4.7 4.3  HGB 9.3* 8.6* 8.7*  HCT 27.9* 27.3* 27.5*  MCV 98.9 100.4* 100.4*  PLT 214 221 224   Basic Metabolic Panel: Recent Labs  Lab 07/09/23 1206 07/13/23 1822 07/13/23 2305 07/14/23 0455  NA 135 133* 133* 136  K 3.4* 2.9* 3.0* 4.3  CL 106 95* 99 105  CO2 23 25 27 22   GLUCOSE 111* 80 74 228*  BUN 7* 10 9 7*  CREATININE 1.24* 1.57* 1.40* 1.44*  CALCIUM 7.5* 8.4* 7.8* 7.3*  MG  --  1.4*  --   --     Studies: DG Abd 2 Views  Result Date: 07/13/2023 CLINICAL DATA:  Vomiting. EXAM: ABDOMEN - 2 VIEW COMPARISON:  None Available. FINDINGS: No bowel dilatation or evidence of obstruction. No free air. Radiopaque calculi over the right kidney measure up to 6 mm over the lower pole. The soft tissues are unremarkable. No acute osseous pathology. IMPRESSION: 1. Nonobstructive bowel gas pattern. 2. Right renal calculi. Electronically Signed   By: Elgie Collard M.D.   On: 07/13/2023 23:13    Scheduled Meds:  atenolol  100 mg Oral Daily   benzocaine   Mouth/Throat TID   Chlorhexidine Gluconate Cloth  6 each Topical Daily   enoxaparin (LOVENOX) injection  40 mg Subcutaneous Q24H   feeding supplement (GLUCERNA SHAKE)  237 mL Oral TID BM   insulin aspart  0-15 Units Subcutaneous TID WC   insulin aspart  0-5  Units Subcutaneous QHS   megestrol  400 mg Oral BID   pantoprazole (PROTONIX) IV  40 mg Intravenous Daily   Followed by   Melene Muller ON 07/16/2023] pantoprazole  40 mg Oral Daily   rosuvastatin  40 mg Oral Daily   saccharomyces boulardii  250 mg Oral BID   sodium chloride flush  3 mL Intravenous Q12H   vancomycin  125 mg Oral QID   Continuous Infusions:  cefTRIAXone (ROCEPHIN)  IV 1 g (07/14/23 1030)   dextrose 5% lactated ringers with KCl 20 mEq/L 100 mL/hr at 07/14/23 0928   metronidazole 500 mg (07/14/23 1137)   PRN Meds: acetaminophen **OR** acetaminophen, benzocaine **FOLLOWED BY** [START ON 07/16/2023] benzocaine, polyethylene glycol  Time spent: 55 minutes  Author: Gillis Santa. MD [facility] 07/14/2023 2:14 PM  To reach On-call, see care teams to locate the attending and reach out to them via [email or web address]. If 7PM-7AM, please contact night-coverage If you still have difficulty reaching the attending provider, please page the [facility] (  Director on Call) for [facility] on [place] for assistance.

## 2023-07-14 NOTE — ED Notes (Signed)
ED TO INPATIENT HANDOFF REPORT  ED Nurse Name and Phone #: Pennelope Bracken EMT-P  S Name/Age/Gender Misty Deleon 72 y.o. female Room/Bed: WA07/WA07  Code Status   Code Status: Full Code  Home/SNF/Other Home Patient oriented to: self, place, time, and situation Is this baseline? Yes   Triage Complete: Triage complete  Chief Complaint Hypokalemia [E87.6]  Triage Note Patient arrives in wheelchair by POV with spouse c/o emesis and feeling unwell since being discharged from hospital on 30th. Spouse states patient was discharged too soon. States she cannot keep her antibiotics down and having fevers at home. States they are supposed to follow up with urology for UTI but do not have appt yet.    Allergies Allergies  Allergen Reactions   Lisinopril Swelling and Other (See Comments)    Entire tongue became swollen and the exterior front of the throat. No shortness of breath noted.    Level of Care/Admitting Diagnosis ED Disposition     ED Disposition  Admit   Condition  --   Comment  Hospital Area: [facility] [[id number]]  Level of Care: Telemetry [5]  Admit to tele based on following criteria: Acute CHF  May admit patient to [facility] or [facility] if equivalent level of care is available:: No  Covid Evaluation: Asymptomatic - no recent exposure (last 10 days) testing not required  Diagnosis: Hypokalemia [172180]  Admitting Physician: Nolberto Hanlon [[id number]]  Attending Physician: Nolberto Hanlon [[id number]]  Certification:: I certify this patient will need inpatient services for at least 2 midnights  Expected Medical Readiness: 07/17/2023          B Medical/Surgery History Past Medical History:  Diagnosis Date   CKD (chronic kidney disease), stage III (HCC)    Deficiency anemia    B12 def.  treated with b12 injection and has received iron infusion's   GERD (gastroesophageal reflux disease)    History of radiation therapy 09/22/20-11/01/20   Cervix, IMRT     Dr. Antony Blackbird   History of radiation therapy 11/09/2020-12/08/2020   cervix, intracavitary brachytherapy   Dr Antony Blackbird   Hypercholesteremia    Hypertension    Hypomagnesemia    Malignant neoplasm cervix [facility]) oncologist--- dr gorsuch/  radiation oncology--- dr Roselind Messier   dx 12/ 2021,  Stage IIIC-1,  with pelvic adeopathy;  concurrent chemo/ radiation;  chemo started 09-20-2020 and pt completed IMRT 11-01-2020 / scheduled for high dose brachytherapy to start 11-09-2020   PCOS (polycystic ovarian syndrome)    PMB (postmenopausal bleeding)    Type 2 diabetes mellitus (HCC)    followed by pc   (11-04-2020 pt stated does not check blood sugar)   Wears contact lenses    Past Surgical History:  Procedure Laterality Date   CESAREAN SECTION  x2 last one 1977   IR IMAGING GUIDED PORT INSERTION  09/16/2020   OPERATIVE ULTRASOUND N/A 11/09/2020   Procedure: OPERATIVE ULTRASOUND;  Surgeon: Antony Blackbird, MD;  Location: [facility];  Service: Urology;  Laterality: N/A;   OPERATIVE ULTRASOUND N/A 11/16/2020   Procedure: OPERATIVE ULTRASOUND;  Surgeon: Antony Blackbird, MD;  Location: [facility];  Service: Urology;  Laterality: N/A;   OPERATIVE ULTRASOUND N/A 11/25/2020   Procedure: OPERATIVE ULTRASOUND;  Surgeon: Antony Blackbird, MD;  Location[facility];  Service: Urology;  Laterality: N/A;   OPERATIVE ULTRASOUND N/A 11/29/2020   Procedure: OPERATIVE ULTRASOUND;  Surgeon: Antony Blackbird, MD;  Location: [facility];  Service: Urology;  Laterality: N/A;   OPERATIVE ULTRASOUND N/A 12/08/2020   Procedure: OPERATIVE ULTRASOUND;  Surgeon: Antony Blackbird, MD;  Location: [facility];  Service: Urology;  Laterality: N/A;   TANDEM RING INSERTION N/A 11/09/2020   Procedure: TANDEM RING INSERTION;  Surgeon: Antony Blackbird, MD;  Location: [facility];  Service: Urology;  Laterality: N/A;   TANDEM RING INSERTION N/A 11/16/2020    Procedure: TANDEM RING INSERTION;  Surgeon: Antony Blackbird, MD;  Location: [facility];  Service: Urology;  Laterality: N/A;   TANDEM RING INSERTION N/A 11/25/2020   Procedure: TANDEM RING INSERTION;  Surgeon: Antony Blackbird, MD;  Location: [facility];  Service: Urology;  Laterality: N/A;   TANDEM RING INSERTION N/A 11/29/2020   Procedure: TANDEM RING INSERTION;  Surgeon: Antony Blackbird, MD;  Location: [facility];  Service: Urology;  Laterality: N/A;   TANDEM RING INSERTION N/A 12/08/2020   Procedure: TANDEM RING INSERTION;  Surgeon: Antony Blackbird, MD;  Location: [facility];  Service: Urology;  Laterality: N/A;   WISDOM TOOTH EXTRACTION  1980s     A IV Location/Drains/Wounds Patient Lines/Drains/Airways Status     Active Line/Drains/Airways     Name Placement date Placement time Site Days   Implanted Port 10/06/20 Right Chest 10/06/20  1000  Chest  1011            Intake/Output Last 24 hours  Intake/Output Summary (Last 24 hours) at 07/14/2023 1253 Last data filed at 07/14/2023 0456 Gross per 24 hour  Intake 1450 ml  Output --  Net 1450 ml    Labs/Imaging Results for orders placed or performed during the hospital encounter of 07/13/23 (from the past 48 hour(s))  Urinalysis, Routine w reflex microscopic -Urine, Clean Catch     Status: Abnormal   Collection Time: 07/13/23  5:11 PM  Result Value Ref Range   Color, Urine AMBER (A) YELLOW    Comment: BIOCHEMICALS MAY BE AFFECTED BY COLOR   APPearance HAZY (A) CLEAR   Specific Gravity, Urine 1.006 1.005 - 1.030   pH 5.0 5.0 - 8.0   Glucose, UA NEGATIVE NEGATIVE mg/dL   Hgb urine dipstick MODERATE (A) NEGATIVE   Bilirubin Urine NEGATIVE NEGATIVE   Ketones, ur NEGATIVE NEGATIVE mg/dL   Protein, ur 30 (A) NEGATIVE mg/dL   Nitrite POSITIVE (A) NEGATIVE   Leukocytes,Ua MODERATE (A) NEGATIVE   RBC / HPF 11-20 0 - 5 RBC/hpf   WBC, UA 11-20 0 - 5 WBC/hpf   Bacteria, UA  RARE (A) NONE SEEN   Squamous Epithelial / HPF 11-20 0 - 5 /HPF    Comment: Performed at [facility], [address] [place], [place] [address]  Lipase, blood     Status: None   Collection Time: 07/13/23  6:22 PM  Result Value Ref Range   Lipase 33 11 - 51 U/L    Comment: Performed at [facility], [address], [place], [place] [address]  Comprehensive metabolic panel     Status: Abnormal   Collection Time: 07/13/23  6:22 PM  Result Value Ref Range   Sodium 133 (L) 135 - 145 mmol/L   Potassium 2.9 (L) 3.5 - 5.1 mmol/L   Chloride 95 (L) 98 - 111 mmol/L   CO2 25 22 - 32 mmol/L   Glucose, Bld 80 70 - 99 mg/dL    Comment: Glucose reference range applies only to samples taken after fasting for at least 8 hours.  BUN 10 8 - 23 mg/dL   Creatinine, Ser 1.61 (H) 0.44 - 1.00 mg/dL   Calcium 8.4 (L) 8.9 - 10.3 mg/dL   Total Protein 5.6 (L) 6.5 - 8.1 g/dL   Albumin 2.5 (L) 3.5 - 5.0 g/dL   AST 28 15 - 41 U/L   ALT 12 0 - 44 U/L   Alkaline Phosphatase 63 38 - 126 U/L   Total Bilirubin 0.6 0.3 - 1.2 mg/dL   GFR, Estimated 35 (L) >60 mL/min    Comment: (NOTE) Calculated using the CKD-EPI Creatinine Equation (2021)    Anion gap 13 5 - 15    Comment: Performed at [facility], [address], [place], [place] [address]  CBC     Status: Abnormal   Collection Time: 07/13/23  6:22 PM  Result Value Ref Range   WBC 5.3 4.0 - 10.5 K/uL   RBC 2.82 (L) 3.87 - 5.11 MIL/uL   Hemoglobin 9.3 (L) 12.0 - 15.0 g/dL   HCT 54.0 (L) 98.1 - 19.1 %   MCV 98.9 80.0 - 100.0 fL   MCH 33.0 26.0 - 34.0 pg   MCHC 33.3 30.0 - 36.0 g/dL   RDW 47.8 29.5 - 62.1 %   Platelets 214 150 - 400 K/uL   nRBC 0.0 0.0 - 0.2 %    Comment: Performed at [facility], [address], [place], [place] [address]  Magnesium     Status: Abnormal   Collection Time: 07/13/23  6:22 PM  Result Value Ref Range   Magnesium 1.4 (L) 1.7 - 2.4 mg/dL     Comment: Performed at [facility], [address] [place], [place] [address]  C Difficile Quick Screen w PCR reflex     Status: Abnormal   Collection Time: 07/13/23  7:12 PM   Specimen: Urine, Clean Catch; Stool  Result Value Ref Range   C Diff antigen POSITIVE (A) NEGATIVE    Comment: CALLED TO GIA BONIS, RN   C Diff toxin POSITIVE (A) NEGATIVE   C Diff interpretation Toxin producing C. difficile detected.     Comment: Performed at [facility], [address], [place], [place] [address]  Basic metabolic panel     Status: Abnormal   Collection Time: 07/13/23 11:05 PM  Result Value Ref Range   Sodium 133 (L) 135 - 145 mmol/L   Potassium 3.0 (L) 3.5 - 5.1 mmol/L   Chloride 99 98 - 111 mmol/L   CO2 27 22 - 32 mmol/L   Glucose, Bld 74 70 - 99 mg/dL    Comment: Glucose reference range applies only to samples taken after fasting for at least 8 hours.   BUN 9 8 - 23 mg/dL   Creatinine, Ser 8.41 (H) 0.44 - 1.00 mg/dL   Calcium 7.8 (L) 8.9 - 10.3 mg/dL   GFR, Estimated 40 (L) >60 mL/min    Comment: (NOTE) Calculated using the CKD-EPI Creatinine Equation (2021)    Anion gap 7 5 - 15    Comment: Performed at [facility], [address], [place], [place] [address]  CBC     Status: Abnormal   Collection Time: 07/13/23 11:05 PM  Result Value Ref Range   WBC 4.7 4.0 - 10.5 K/uL   RBC 2.72 (L) 3.87 - 5.11 MIL/uL   Hemoglobin 8.6 (L) 12.0 - 15.0 g/dL   HCT 10.2 (L) 72.5 - 36.6 %   MCV 100.4 (H) 80.0 - 100.0  fL   MCH 31.6 26.0 - 34.0 pg   MCHC 31.5 30.0 - 36.0 g/dL   RDW 38.1 (H) 82.9 - 93.7 %   Platelets 221 150 - 400 K/uL   nRBC 0.0 0.0 - 0.2 %    Comment: Performed at [facility], [address] [place], [place] [address]  CBG monitoring, ED     Status: Abnormal   Collection Time: 07/13/23 11:21 PM  Result Value Ref Range   Glucose-Capillary 64 (L) 70 - 99 mg/dL    Comment: Glucose reference range applies  only to samples taken after fasting for at least 8 hours.  CBG monitoring, ED     Status: Abnormal   Collection Time: 07/14/23  2:16 AM  Result Value Ref Range   Glucose-Capillary 104 (H) 70 - 99 mg/dL    Comment: Glucose reference range applies only to samples taken after fasting for at least 8 hours.  APTT     Status: None   Collection Time: 07/14/23  4:55 AM  Result Value Ref Range   aPTT 34 24 - 36 seconds    Comment: Performed at [facility], [address] [place], [place] [address]  Protime-INR     Status: Abnormal   Collection Time: 07/14/23  4:55 AM  Result Value Ref Range   Prothrombin Time 15.7 (H) 11.4 - 15.2 seconds   INR 1.2 0.8 - 1.2    Comment: (NOTE) INR goal varies based on device and disease states. Performed at [facility], [address], [place], [place] [address]   Basic metabolic panel     Status: Abnormal   Collection Time: 07/14/23  4:55 AM  Result Value Ref Range   Sodium 136 135 - 145 mmol/L   Potassium 4.3 3.5 - 5.1 mmol/L   Chloride 105 98 - 111 mmol/L   CO2 22 22 - 32 mmol/L   Glucose, Bld 228 (H) 70 - 99 mg/dL    Comment: Glucose reference range applies only to samples taken after fasting for at least 8 hours.   BUN 7 (L) 8 - 23 mg/dL   Creatinine, Ser 0.25 (H) 0.44 - 1.00 mg/dL   Calcium 7.3 (L) 8.9 - 10.3 mg/dL   GFR, Estimated 39 (L) >60 mL/min    Comment: (NOTE) Calculated using the CKD-EPI Creatinine Equation (2021)    Anion gap 9 5 - 15    Comment: Performed at [facility], [address] [place], [place] [address]  CBC     Status: Abnormal   Collection Time: 07/14/23  4:55 AM  Result Value Ref Range   WBC 4.3 4.0 - 10.5 K/uL   RBC 2.74 (L) 3.87 - 5.11 MIL/uL   Hemoglobin 8.7 (L) 12.0 - 15.0 g/dL   HCT 82.4 (L) 23.5 - 36.1 %   MCV 100.4 (H) 80.0 - 100.0 fL   MCH 31.8 26.0 - 34.0 pg   MCHC 31.6 30.0 - 36.0 g/dL   RDW 44.3 (H) 15.4 - 00.8 %   Platelets 224 150 - 400 K/uL    nRBC 0.0 0.0 - 0.2 %    Comment: Performed at [facility], [address], [place], [place] [address]  CBG monitoring, ED     Status: Abnormal   Collection Time: 07/14/23  8:32 AM  Result Value Ref Range   Glucose-Capillary 124 (H) 70 - 99 mg/dL    Comment: Glucose reference range applies only to samples taken after  fasting for at least 8 hours.  CBG monitoring, ED     Status: Abnormal   Collection Time: 07/14/23 11:58 AM  Result Value Ref Range   Glucose-Capillary 147 (H) 70 - 99 mg/dL    Comment: Glucose reference range applies only to samples taken after fasting for at least 8 hours.   DG Abd 2 Views  Result Date: 07/13/2023 CLINICAL DATA:  Vomiting. EXAM: ABDOMEN - 2 VIEW COMPARISON:  None Available. FINDINGS: No bowel dilatation or evidence of obstruction. No free air. Radiopaque calculi over the right kidney measure up to 6 mm over the lower pole. The soft tissues are unremarkable. No acute osseous pathology. IMPRESSION: 1. Nonobstructive bowel gas pattern. 2. Right renal calculi. Electronically Signed   By: Elgie Collard M.D.   On: 07/13/2023 23:13    Pending Labs Unresulted Labs (From admission, onward)     Start     Ordered   07/20/23 0500  Creatinine, serum  (enoxaparin (LOVENOX)    CrCl >/= 30 ml/min)  Weekly,   R     Comments: while on enoxaparin therapy    07/13/23 2216   07/15/23 0500  Basic metabolic panel  Daily,   R      07/14/23 0806   07/15/23 0500  CBC  Daily,   R      07/14/23 0806   07/15/23 0500  Magnesium  Daily,   R      07/14/23 0806   07/15/23 0500  Phosphorus  Daily,   R      07/14/23 0806   07/14/23 0806  Iron and TIBC  Add-on,   AD        07/14/23 0805   07/14/23 0806  Folate  Add-on,   AD        07/14/23 0805   07/14/23 0804  Magnesium  Add-on,   AD       Question:  Specimen collection method  Answer:  Lab=Lab collect   07/14/23 0803   07/14/23 0804  Phosphorus  Add-on,   AD       Question:  Specimen collection method   Answer:  Lab=Lab collect   07/14/23 0803   07/14/23 0804  VITAMIN D 25 Hydroxy (Vit-D Deficiency, Fractures)  Add-on,   AD       Question:  Specimen collection method  Answer:  Lab=Lab collect   07/14/23 0803   07/13/23 2207  Sodium, urine, random  Add-on,   AD        07/13/23 2206   07/13/23 2207  Creatinine, urine, random  Add-on,   AD        07/13/23 2206   07/13/23 2053  Urine Culture (for pregnant, neutropenic or urologic patients or patients with an indwelling urinary catheter)  (Urine Labs)  Once,   URGENT       Question:  Indication  Answer:  Dysuria   07/13/23 2052   07/13/23 1806  Gastrointestinal Panel by PCR , Stool  (Gastrointestinal Panel by PCR, Stool                                                                                                                                                     **  Does Not include CLOSTRIDIUM DIFFICILE testing. **If CDIFF testing is needed, place order from the "C Difficile Testing" order set.**)  Once,   URGENT        07/13/23 1806            Vitals/Pain Today's Vitals   07/14/23 0909 07/14/23 1138 07/14/23 1200 07/14/23 1217  BP: (!) 115/55  (!) 120/56 (!) 120/56  Pulse: 70   73  Resp: 16  (!) 23 16  Temp:  98.6 F (37 C)    TempSrc:  Oral    SpO2: 99%   98%  Weight:      Height:      PainSc:        Isolation Precautions Enteric precautions (UV disinfection)  Medications Medications  vancomycin (VANCOCIN) capsule 125 mg (125 mg Oral Given 07/13/23 2349)  metroNIDAZOLE (FLAGYL) IVPB 500 mg (500 mg Intravenous New Bag/Given 07/14/23 1137)  cefTRIAXone (ROCEPHIN) 1 g in sodium chloride 0.9 % 100 mL IVPB (1 g Intravenous New Bag/Given 07/14/23 1030)  acetaminophen (TYLENOL) tablet 650 mg (has no administration in time range)    Or  acetaminophen (TYLENOL) suppository 650 mg (has no administration in time range)  polyethylene glycol (MIRALAX / GLYCOLAX) packet 17 g (has no administration in time range)  sodium chloride flush  (NS) 0.9 % injection 3 mL (3 mLs Intravenous Given 07/13/23 2303)  dextrose 5% in lactated ringers with KCl 20 mEq/L infusion ( Intravenous New Bag/Given 07/14/23 0928)  atenolol (TENORMIN) tablet 100 mg (100 mg Oral Not Given 07/14/23 1044)  insulin aspart (novoLOG) injection 0-15 Units ( Subcutaneous Not Given 07/14/23 0934)  insulin aspart (novoLOG) injection 0-5 Units ( Subcutaneous Not Given 07/13/23 2329)  rosuvastatin (CRESTOR) tablet 40 mg (has no administration in time range)  saccharomyces boulardii (FLORASTOR) capsule 250 mg (has no administration in time range)  feeding supplement (GLUCERNA SHAKE) (GLUCERNA SHAKE) liquid 237 mL (has no administration in time range)  megestrol (MEGACE) tablet 40 mg (has no administration in time range)  benzocaine (ORAJEL) 10 % mucosal gel (has no administration in time range)    Followed by  benzocaine (ORAJEL) 10 % mucosal gel (has no administration in time range)  pantoprazole (PROTONIX) injection 40 mg (has no administration in time range)    Followed by  pantoprazole (PROTONIX) EC tablet 40 mg (has no administration in time range)  Chlorhexidine Gluconate Cloth 2 % PADS 6 each (has no administration in time range)  ondansetron (ZOFRAN) injection 4 mg (4 mg Intravenous Given 07/13/23 1823)  lactated ringers bolus 1,000 mL (0 mLs Intravenous Stopped 07/13/23 1951)  potassium chloride SA (KLOR-CON M) CR tablet 40 mEq (40 mEq Oral Given 07/13/23 2022)  potassium chloride 10 mEq in 100 mL IVPB (0 mEq Intravenous Stopped 07/13/23 2226)  magnesium sulfate IVPB 2 g 50 mL (0 g Intravenous Stopped 07/13/23 2251)  cefTRIAXone (ROCEPHIN) 1 g in sodium chloride 0.9 % 100 mL IVPB (0 g Intravenous Stopped 07/14/23 0301)  potassium chloride 10 mEq in 100 mL IVPB (0 mEq Intravenous Stopped 07/14/23 0456)    Mobility walks     Focused Assessments See Chart   R Recommendations: See Admitting Provider Note  Report given to: [facility] LPN

## 2023-07-14 NOTE — Plan of Care (Signed)

## 2023-07-15 DIAGNOSIS — A0472 Enterocolitis due to Clostridium difficile, not specified as recurrent: Secondary | ICD-10-CM | POA: Diagnosis not present

## 2023-07-15 LAB — BASIC METABOLIC PANEL
Anion gap: 5 (ref 5–15)
BUN: <5 mg/dL — ABNORMAL LOW (ref 8–23)
CO2: 24 mmol/L (ref 22–32)
Calcium: 7.1 mg/dL — ABNORMAL LOW (ref 8.9–10.3)
Chloride: 110 mmol/L (ref 98–111)
Creatinine, Ser: 1.21 mg/dL — ABNORMAL HIGH (ref 0.44–1.00)
GFR, Estimated: 48 mL/min — ABNORMAL LOW (ref >60)
Glucose, Bld: 100 mg/dL — ABNORMAL HIGH (ref 70–99)
Potassium: 3.6 mmol/L (ref 3.5–5.1)
Sodium: 139 mmol/L (ref 135–145)

## 2023-07-15 LAB — CBC
HCT: 23.9 % — ABNORMAL LOW (ref 36.0–46.0)
Hemoglobin: 7.5 g/dL — ABNORMAL LOW (ref 12.0–15.0)
MCH: 32.2 pg (ref 26.0–34.0)
MCHC: 31.4 g/dL (ref 30.0–36.0)
MCV: 102.6 fL — ABNORMAL HIGH (ref 80.0–100.0)
Platelets: 189 10*3/uL (ref 150–400)
RBC: 2.33 MIL/uL — ABNORMAL LOW (ref 3.87–5.11)
RDW: 15.9 % — ABNORMAL HIGH (ref 11.5–15.5)
WBC: 3.7 10*3/uL — ABNORMAL LOW (ref 4.0–10.5)
nRBC: 0 % (ref 0.0–0.2)

## 2023-07-15 LAB — MAGNESIUM: Magnesium: 1.5 mg/dL — ABNORMAL LOW (ref 1.7–2.4)

## 2023-07-15 LAB — PHOSPHORUS: Phosphorus: 2.6 mg/dL (ref 2.5–4.6)

## 2023-07-15 MED ORDER — MAGNESIUM SULFATE 2 GM/50ML IV SOLN
2.0000 g | Freq: Once | INTRAVENOUS | Status: AC
  Administered 2023-07-15: 2 g via INTRAVENOUS
  Filled 2023-07-15: qty 50

## 2023-07-15 MED ORDER — FUROSEMIDE 10 MG/ML IJ SOLN
40.0000 mg | Freq: Once | INTRAMUSCULAR | Status: AC
  Administered 2023-07-15: 40 mg via INTRAVENOUS
  Filled 2023-07-15: qty 4

## 2023-07-15 NOTE — Progress Notes (Signed)
[facility] Progress Note  Patient: Misty Deleon    ACZ:[id number]  DOA: 07/13/2023     Date of Service: the patient was seen and examined on 07/15/2023  Chief Complaint  Patient presents with   Emesis   Brief hospital course: Misty Deleon is a 72 y.o. female with PMH of cervical cancer on immunotherapy. On 10/15 pt had dysuria s/p Abx for 5 days. Pt had persistent dysuria, prescribed another course of antibiotics but urine culture was negative so antibiotics were stopped and patient was scheduled for cystoscopy by urologist but patient got sick again on 10/23rd, so cystoscopy could not be done, patient was admitted on 10/25 for infection, treated with IV antibiotics.  Patient developed diarrhea during hospital stay but she was discharged without testing.  Her condition did not improve and had crampy abdominal pain with diarrhea and fever.  Patient was tested positive for C. difficile and admitted for further management as below.   Assessment and Plan:  C. Difficile diarrhea Improving, has had only 1 bowel movement in last 24 hours and appears to be stable for discharge.  Her husband is very resistant to discharge claiming that he believes that she was discharged too early last time and he does not think she is ready because she is not eating enough and she is weak.  Had a lengthy discussion with the husband and we went through medical necessity for continuation of the hospitalization but he remained firm on his belief.   Recurrent UTI UA: LE moderate, nitrite positive, bacteria rare, WBC 11-20 Continue ceftriaxone, urine culture negative.  She still complains of dysuria.  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  AKI secondary to dehydration, improved, baseline 0.9, improved to 1.2 today.    HTN, HLD BP soft, continued  atenolol 100 mg p.o. daily with holding parameters Held losartan Continue Crestor Monitor BP and titrate medications accordingly  NIDDM T2 Appears to be on glimepiride and Januvia, interestingly her last hemoglobin A1c was only 4.4, not sure if she really needs those, held home medications for now Continue Humalog sliding scale, monitor CBG  Mouth soreness, complains of mild soreness, no ulcers on my examination.  She was started oral gel, and nystatin swish and swallow 3 times daily for 3 days by previous hospitalist.  I do not see any signs of candidiasis.  Will discontinue nystatin.  Will order Magic mouthwash.  Cervical cancer Chornic diangosis. Patient has been getting bevacizumab. See onc note 07/03/2023  Bilateral lower extremity edema: +2 pitting edema bilateral lower extremity.  Will give her 1 dose of IV Lasix 40 and ordered TED hose.  Hypomagnesemia: Replenished.  Body mass index is 36.21 kg/m.  Interventions:  Diet: soft diet DVT Prophylaxis: Subcutaneous Lovenox   Advance goals of care discussion: Full code  Family Communication: Husband at bedside.  Disposition:  Pt is from Home, admitted with C. difficile diarrhea and recurrent UTI, in my opinion, she is medically ready for discharge but husband does not believe so.  Subjective: Seen and examined, husband at the bedside.  Patient has many many complaints.  She is worried about many things, generalized weakness, diarrhea, although she has had 1 episode in last 24 hours, edema, mouth pain.  Physical Exam: General exam: Appears calm and comfortable  Respiratory system: Clear to auscultation. Respiratory effort normal. Cardiovascular system: S1 &  S2 heard, RRR. No JVD, murmurs, rubs, gallops or clicks. No pedal edema. Gastrointestinal system: Abdomen is nondistended, soft and nontender. No organomegaly or masses felt. Normal bowel sounds heard. Central nervous system: Alert and oriented. No focal neurological  deficits. Extremities: Symmetric 5 x 5 power. Skin: No rashes, lesions or ulcers.  Psychiatry: Judgement and insight appear normal. Mood & affect appropriate.    Vitals:   07/14/23 1351 07/14/23 1755 07/14/23 2047 07/15/23 0332  BP: (!) 120/42 (!) 121/58 (!) 116/59 (!) 127/57  Pulse: 69 75 78 77  Resp: 16 18 18 18   Temp: 98.8 F (37.1 C) 98.6 F (37 C) 99.1 F (37.3 C) 98.6 F (37 C)  TempSrc: Oral Oral Oral Oral  SpO2: 98% 98% 100% 100%  Weight:      Height:        Intake/Output Summary (Last 24 hours) at 07/15/2023 0941 Last data filed at 07/15/2023 0144 Gross per 24 hour  Intake 3043.84 ml  Output --  Net 3043.84 ml   Filed Weights   07/13/23 1708  Weight: 89.8 kg    Data Reviewed: I have personally reviewed and interpreted daily labs, tele strips, imagings as discussed above. I reviewed all nursing notes, pharmacy notes, vitals, pertinent old records I have discussed plan of care as described above with RN and patient/family.  CBC: Recent Labs  Lab 07/13/23 1822 07/13/23 2305 07/14/23 0455 07/15/23 0317  WBC 5.3 4.7 4.3 3.7*  HGB 9.3* 8.6* 8.7* 7.5*  HCT 27.9* 27.3* 27.5* 23.9*  MCV 98.9 100.4* 100.4* 102.6*  PLT 214 221 224 189   Basic Metabolic Panel: Recent Labs  Lab 07/09/23 1206 07/13/23 1822 07/13/23 2305 07/14/23 0455 07/14/23 1410 07/15/23 0317  NA 135 133* 133* 136  --  139  K 3.4* 2.9* 3.0* 4.3  --  3.6  CL 106 95* 99 105  --  110  CO2 23 25 27 22   --  24  GLUCOSE 111* 80 74 228*  --  100*  BUN 7* 10 9 7*  --  <5*  CREATININE 1.24* 1.57* 1.40* 1.44*  --  1.21*  CALCIUM 7.5* 8.4* 7.8* 7.3*  --  7.1*  MG  --  1.4*  --   --  1.7 1.5*  PHOS  --   --   --   --  <1.0* 2.6    Studies: No results found.  Scheduled Meds:  atenolol  100 mg Oral Daily   Chlorhexidine Gluconate Cloth  6 each Topical Daily   enoxaparin (LOVENOX) injection  40 mg Subcutaneous Q24H   feeding supplement (GLUCERNA SHAKE)  237 mL Oral TID BM   furosemide  40  mg Intravenous Once   insulin aspart  0-15 Units Subcutaneous TID WC   insulin aspart  0-5 Units Subcutaneous QHS   magic mouthwash w/lidocaine  5 mL Oral QID   megestrol  400 mg Oral BID   nystatin  5 mL Oral TID   pantoprazole (PROTONIX) IV  40 mg Intravenous Daily   Followed by   Melene Muller ON 07/16/2023] pantoprazole  40 mg Oral Daily   rosuvastatin  40 mg Oral Daily   saccharomyces boulardii  250 mg Oral BID   sodium chloride flush  3 mL Intravenous Q12H   vancomycin  125 mg Oral QID   Continuous Infusions:  sodium chloride Stopped (07/15/23 0447)   cefTRIAXone (ROCEPHIN)  IV Stopped (07/14/23 1103)   magnesium sulfate bolus IVPB     metronidazole 500 mg (07/14/23  2153)   PRN Meds: acetaminophen **OR** acetaminophen, ondansetron (ZOFRAN) IV  Time spent: 55 minutes  Author: Hughie Closs. MD [facility] 07/15/2023 9:41 AM  To reach On-call, see care teams to locate the attending and reach out to them via [email or web address]. If 7PM-7AM, please contact night-coverage If you still have difficulty reaching the attending provider, please page the [facility] (Director on Call) for [facility] on amion for assistance.

## 2023-07-15 NOTE — Progress Notes (Signed)
Patient called nurse to room c/o ankle swelling. Ankles have a new +1 edema to them. Pt afraid that she is having an allergic reaction to a new medication or is swollen from the IVF. Pt requesting to turn off IVF at this time. Provider on call notified.

## 2023-07-15 NOTE — Plan of Care (Signed)
  Problem: Coping: Goal: Ability to adjust to condition or change in health will improve Outcome: Progressing   Problem: Activity: Goal: Risk for activity intolerance will decrease Outcome: Progressing   Problem: Coping: Goal: Level of anxiety will decrease Outcome: Progressing   Problem: Elimination: Goal: Will not experience complications related to bowel motility Outcome: Progressing   Problem: Pain Management: Goal: General experience of comfort will improve Outcome: Progressing   Problem: Safety: Goal: Ability to remain free from injury will improve Outcome: Progressing   Problem: Skin Integrity: Goal: Risk for impaired skin integrity will decrease Outcome: Progressing

## 2023-07-16 ENCOUNTER — Telehealth (HOSPITAL_COMMUNITY): Payer: Self-pay | Admitting: Pharmacy Technician

## 2023-07-16 ENCOUNTER — Other Ambulatory Visit (HOSPITAL_COMMUNITY): Payer: Self-pay

## 2023-07-16 ENCOUNTER — Encounter: Payer: Self-pay | Admitting: Hematology and Oncology

## 2023-07-16 DIAGNOSIS — A0472 Enterocolitis due to Clostridium difficile, not specified as recurrent: Secondary | ICD-10-CM | POA: Diagnosis not present

## 2023-07-16 LAB — CBC WITH DIFFERENTIAL/PLATELET
Abs Immature Granulocytes: 0.04 10*3/uL (ref 0.00–0.07)
Basophils Absolute: 0 10*3/uL (ref 0.0–0.1)
Basophils Relative: 0 %
Eosinophils Absolute: 0.4 10*3/uL (ref 0.0–0.5)
Eosinophils Relative: 10 %
HCT: 24.4 % — ABNORMAL LOW (ref 36.0–46.0)
Hemoglobin: 7.6 g/dL — ABNORMAL LOW (ref 12.0–15.0)
Immature Granulocytes: 1 %
Lymphocytes Relative: 27 %
Lymphs Abs: 1 10*3/uL (ref 0.7–4.0)
MCH: 31.8 pg (ref 26.0–34.0)
MCHC: 31.1 g/dL (ref 30.0–36.0)
MCV: 102.1 fL — ABNORMAL HIGH (ref 80.0–100.0)
Monocytes Absolute: 0.3 10*3/uL (ref 0.1–1.0)
Monocytes Relative: 7 %
Neutro Abs: 1.9 10*3/uL (ref 1.7–7.7)
Neutrophils Relative %: 55 %
Platelets: 194 10*3/uL (ref 150–400)
RBC: 2.39 MIL/uL — ABNORMAL LOW (ref 3.87–5.11)
RDW: 15.9 % — ABNORMAL HIGH (ref 11.5–15.5)
WBC: 3.6 10*3/uL — ABNORMAL LOW (ref 4.0–10.5)
nRBC: 0 % (ref 0.0–0.2)

## 2023-07-16 LAB — URINE CULTURE: Culture: 10000 — AB

## 2023-07-16 LAB — BASIC METABOLIC PANEL
Anion gap: 7 (ref 5–15)
BUN: 5 mg/dL — ABNORMAL LOW (ref 8–23)
CO2: 26 mmol/L (ref 22–32)
Calcium: 7.5 mg/dL — ABNORMAL LOW (ref 8.9–10.3)
Chloride: 106 mmol/L (ref 98–111)
Creatinine, Ser: 1.19 mg/dL — ABNORMAL HIGH (ref 0.44–1.00)
GFR, Estimated: 49 mL/min — ABNORMAL LOW (ref >60)
Glucose, Bld: 97 mg/dL (ref 70–99)
Potassium: 3.2 mmol/L — ABNORMAL LOW (ref 3.5–5.1)
Sodium: 139 mmol/L (ref 135–145)

## 2023-07-16 LAB — CREATININE, URINE, RANDOM: Creatinine, Urine: 83 mg/dL

## 2023-07-16 LAB — SODIUM, URINE, RANDOM: Sodium, Ur: 21 mmol/L

## 2023-07-16 LAB — MAGNESIUM: Magnesium: 1.8 mg/dL (ref 1.7–2.4)

## 2023-07-16 LAB — PHOSPHORUS: Phosphorus: 2.3 mg/dL — ABNORMAL LOW (ref 2.5–4.6)

## 2023-07-16 MED ORDER — NYSTATIN 100000 UNIT/ML MT SUSP
5.0000 mL | Freq: Four times a day (QID) | OROMUCOSAL | 0 refills | Status: AC | PRN
  Filled 2023-07-16: qty 200, 10d supply, fill #0

## 2023-07-16 MED ORDER — POTASSIUM CHLORIDE CRYS ER 20 MEQ PO TBCR
40.0000 meq | EXTENDED_RELEASE_TABLET | ORAL | Status: AC
  Administered 2023-07-16 (×2): 40 meq via ORAL
  Filled 2023-07-16 (×2): qty 2

## 2023-07-16 MED ORDER — SACCHAROMYCES BOULARDII 250 MG PO CAPS
250.0000 mg | ORAL_CAPSULE | Freq: Two times a day (BID) | ORAL | 0 refills | Status: AC
  Filled 2023-07-16: qty 20, 10d supply, fill #0

## 2023-07-16 MED ORDER — HEPARIN SOD (PORK) LOCK FLUSH 100 UNIT/ML IV SOLN
500.0000 [IU] | INTRAVENOUS | Status: AC | PRN
  Administered 2023-07-16: 500 [IU]

## 2023-07-16 MED ORDER — VANCOMYCIN 25 MG/ML COMPOUNDED TOPICAL OPHTHALMIC SOLUTION
1.0000 [drp] | Freq: Four times a day (QID) | OPHTHALMIC | 0 refills | Status: DC
  Filled 2023-07-16: qty 1.6, 8d supply, fill #0

## 2023-07-16 MED ORDER — VANCOMYCIN HCL 125 MG PO CAPS
125.0000 mg | ORAL_CAPSULE | Freq: Four times a day (QID) | ORAL | 0 refills | Status: DC
  Filled 2023-07-16: qty 32, 8d supply, fill #0

## 2023-07-16 MED ORDER — NITROFURANTOIN MONOHYD MACRO 100 MG PO CAPS
100.0000 mg | ORAL_CAPSULE | Freq: Two times a day (BID) | ORAL | 0 refills | Status: AC
Start: 2023-07-16 — End: 2023-07-21
  Filled 2023-07-16: qty 10, 5d supply, fill #0

## 2023-07-16 NOTE — Telephone Encounter (Signed)
Pharmacy Patient Advocate Encounter  Received notification from [facility] that Prior Authorization for Vancomycin HCl 125MG  capsules has been APPROVED from 07/16/2023 to 09/10/2024   PA #/Case ID/Reference #: [id number] Key: [id number]

## 2023-07-16 NOTE — Discharge Summary (Signed)
Physician Discharge Summary  Misty Deleon VHQ:[id number] DOB: 10-Sep-1951 DOA: 07/13/2023  PCP: Marletta Lor, NP  Admit date: 07/13/2023 Discharge date: 07/16/2023 30 Day Unplanned Readmission Risk Score    Flowsheet Row ED to Hosp-Admission (Current) from 07/13/2023 in [facility]  30 Day Unplanned Readmission Risk Score (%) 26.83 Filed at 07/16/2023 0801       This score is the patient's risk of an unplanned readmission within 30 days of being discharged (0 -100%). The score is based on dignosis, age, lab data, medications, orders, and past utilization.   Low:  0-14.9   Medium: 15-21.9   High: 22-29.9   Extreme: 30 and above          Admitted From: Home Disposition: Home  Recommendations for Outpatient Follow-up:  Follow up with PCP in 1-2 weeks Please obtain BMP/CBC in one week Please follow up with your PCP on the following pending results: Unresulted Labs (From admission, onward)     Start     Ordered   07/20/23 0500  Creatinine, serum  (enoxaparin (LOVENOX)    CrCl >/= 30 ml/min)  Weekly,   R     Comments: while on enoxaparin therapy    07/13/23 2216   07/15/23 0500  Basic metabolic panel  Daily,   R      07/14/23 0806   07/15/23 0500  CBC  Daily,   R      07/14/23 0806   07/15/23 0500  Magnesium  Daily,   R      07/14/23 0806   07/15/23 0500  Phosphorus  Daily,   R      07/14/23 0806   07/13/23 2207  Sodium, urine, random  Add-on,   AD        07/13/23 2206   07/13/23 2207  Creatinine, urine, random  Add-on,   AD        07/13/23 2206              Home Health: None Equipment/Devices: None  Discharge Condition: Stable CODE STATUS: Full code Diet recommendation: Cardiac  Subjective: Seen and examined, no complaints.  Has had 1 bowel movement last 24 hours which was formed as well.  Husband at the bedside.  She feels well and willing to go home today.  Brief/Interim Summary: Misty Deleon is a 72 y.o. female  with PMH of cervical cancer on immunotherapy. On 10/15 pt had dysuria s/p Abx for 5 days. Pt had persistent dysuria, prescribed another course of antibiotics but urine culture was negative so antibiotics were stopped and patient was scheduled for cystoscopy by urologist but patient got sick again on 10/23rd, so cystoscopy could not be done, patient was admitted on 10/25 for infection, treated with IV antibiotics.  Patient developed diarrhea during hospital stay but she was discharged without testing.  Her condition did not improve and had crampy abdominal pain with diarrhea and fever.  Patient admitted to hospital at this time tested positive for C. difficile diarrhea.  She was started on vancomycin oral as well as Flagyl.  Patient has now improved, has had only 1 bowel movement in last 24 hours and that was formed as well.  No other complaints.  She is being discharged on 8 more days of oral vancomycin.     Recurrent UTI UA: LE moderate, nitrite positive, bacteria rare, WBC 11-20 She was started on Rocephin, urine culture growing only 10,000 colonies of Enterococcus faecalis, to make sure this  does not turn out to be in full spectrum infection, I will prescribe her 5 days of oral Macrobid.  Despite of being on antibiotics, patient still complains of dysuria at times.  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   AKI secondary to dehydration, improved, baseline 0.9, improved to 1.2 today.     HTN, HLD BP soft, continued atenolol 100 mg p.o. daily with holding parameters Held losartan and is being discontinued at discharge. Continue Crestor Monitor BP and titrate medications accordingly   NIDDM T2 Appears to be on glimepiride and Januvia, interestingly her last hemoglobin A1c was only 4.4, not sure if she really needs those.  Will defer to PCP.    Mouth soreness, resolved with Magic mouthwash.   Cervical cancer Chornic diangosis. Patient has been getting bevacizumab. See onc note 07/03/2023   Bilateral lower extremity edema: +2 pitting edema bilateral lower extremity yesterday but improved today.  Gave her 1 dose of Lasix yesterday and TED hose.   Hypomagnesemia: Replenished.  Hypokalemia: Slightly low, will be replenished before discharge.  Hypophosphatemia: Slightly low, will be replenished before discharge.  Discharge plan was discussed with patient and/or family member and they verbalized understanding and agreed with it.  Discharge Diagnoses:  Principal Problem:   C. difficile colitis Active Problems:   UTI (urinary tract infection)   Cervical cancer (HCC)   Essential hypertension   Hypokalemia   AKI (acute kidney injury) (HCC)    Discharge Instructions   Allergies as of 07/16/2023       Reactions   Lisinopril Swelling, Other (See Comments)   Entire tongue became swollen and the exterior front of the throat. No shortness of breath noted.        Medication List     STOP taking these medications    calcium carbonate 500 MG chewable tablet Commonly known as: TUMS - dosed in mg elemental calcium   cephALEXin 500 MG capsule Commonly known as: KEFLEX   famotidine 20 MG tablet Commonly known as: Pepcid   losartan 50 MG tablet Commonly known as: COZAAR       TAKE these medications    antiseptic oral rinse Liqd 15 mLs by Mouth Rinse route as needed for dry mouth.   atenolol 100 MG tablet Commonly known as: Tenormin Take 1 tablet (100 mg total) by mouth daily. Take in the morning   cholecalciferol 25 MCG (1000 UNIT) tablet Commonly known as: VITAMIN D3 Take 2,000 Units by mouth daily.   cyanocobalamin 1000 MCG tablet Commonly known as: VITAMIN B12 Take 1,000 mcg by mouth in the morning.   diphenhydrAMINE 25 MG tablet Commonly known as: Benadryl Allergy Take 2 tablets (50 mg total) by  mouth every 6 (six) hours as needed for allergies.   ezetimibe 10 MG tablet Commonly known as: ZETIA Take 10 mg by mouth in the morning.   glimepiride 1 MG tablet Commonly known as: AMARYL Take 1 mg by mouth daily with breakfast.   lidocaine-prilocaine cream Commonly known as: EMLA Apply 1 Application topically as needed (for port access).   nitrofurantoin (macrocrystal-monohydrate) 100 MG capsule Commonly known as: Macrobid Take 1 capsule (100 mg total) by mouth 2 (two) times daily for 5 days.   ondansetron 8 MG tablet Commonly known as: Zofran Take 1 tablet (8 mg total) by mouth every 8 (eight) hours as needed for  nausea or vomiting. Start on the third day after chemotherapy.   phenazopyridine 200 MG tablet Commonly known as: PYRIDIUM Take 1 tablet (200 mg total) by mouth 3 (three) times daily with meals.   prochlorperazine 10 MG tablet Commonly known as: COMPAZINE Take 1 tablet (10 mg total) by mouth every 6 (six) hours as needed for nausea or vomiting.   rosuvastatin 40 MG tablet Commonly known as: CRESTOR Take 40 mg by mouth at bedtime.   saccharomyces boulardii 250 MG capsule Commonly known as: FLORASTOR Take 1 capsule (250 mg total) by mouth 2 (two) times daily for 10 days.   sitaGLIPtin 100 MG tablet Commonly known as: JANUVIA Take 50 mg by mouth in the morning.   traMADol 50 MG tablet Commonly known as: ULTRAM Take 2 tablets (100 mg total) by mouth every 6 (six) hours as needed. What changed: reasons to take this   TYLENOL 500 MG tablet Generic drug: acetaminophen Take 500-1,000 mg by mouth every 6 (six) hours as needed for mild pain or headache.   vancomycin 125 MG capsule Commonly known as: VANCOCIN Take 1 capsule (125 mg total) by mouth 4 (four) times daily for 8 days.        Follow-up Information     Marletta Lor, NP Follow up in 1 week(s).   Specialty: Nurse Practitioner Contact information: [address] [place]  [address] [phone]                Allergies  Allergen Reactions   Lisinopril Swelling and Other (See Comments)    Entire tongue became swollen and the exterior front of the throat. No shortness of breath noted.    Consultations: None   Procedures/Studies: DG Abd 2 Views  Result Date: 07/13/2023 CLINICAL DATA:  Vomiting. EXAM: ABDOMEN - 2 VIEW COMPARISON:  None Available. FINDINGS: No bowel dilatation or evidence of obstruction. No free air. Radiopaque calculi over the right kidney measure up to 6 mm over the lower pole. The soft tissues are unremarkable. No acute osseous pathology. IMPRESSION: 1. Nonobstructive bowel gas pattern. 2. Right renal calculi. Electronically Signed   By: Elgie Collard M.D.   On: 07/13/2023 23:13   CT ABDOMEN PELVIS W CONTRAST  Result Date: 07/06/2023 CLINICAL DATA:  Left lower quadrant pain. Fever, nausea, vomiting and diarrhea. History of cervical cancer with lung metastases. EXAM: CT ABDOMEN AND PELVIS WITH CONTRAST TECHNIQUE: Multidetector CT imaging of the abdomen and pelvis was performed using the standard protocol following bolus administration of intravenous contrast. RADIATION DOSE REDUCTION: This exam was performed according to the departmental dose-optimization program which includes automated exposure control, adjustment of the mA and/or kV according to patient size and/or use of iterative reconstruction technique. CONTRAST:  OMNIPAQUE IOHEXOL 300 MG/ML  SOLN COMPARISON:  CT 05/17/2023 and older. FINDINGS: Lower chest: Lung bases are grossly clear. No pleural effusion. Trace pericardial fluid. Hepatobiliary: Fatty liver infiltration. No space-occupying liver lesion. Hepatic granulomas. Patent portal vein. Gallbladder is distended. Pancreas: Unremarkable. No pancreatic ductal dilatation or surrounding inflammatory changes. Spleen: Splenic granulomas are identified. Small splenule anteriorly and in the hilum. Adrenals/Urinary Tract: Adrenal  glands are preserved. No left-sided enhancing renal mass or collecting system dilatation. The left ureter has a normal course and caliber down to the bladder. The bladder is contracted with some wall thickening and stranding. Please correlate for any clinical evidence of cystitis. Bifid left renal collecting system. The previous 7 mm stone in the right renal pelvis is now in the lower  pole of the right kidney. Second nonobstructing upper pole stone is stable. However there is new moderate right renal collecting system dilatation with increasing perinephric stranding. There is some urothelial thickening as well along the course of the right renal pelvis and ureter. The ureter does slowly taper to a more normal caliber towards the UVJ. No distal ureteral stone. Please correlate for any signs of urothelial infection. Stomach/Bowel: Stomach is relatively decompressed. The small bowel is nondilated. Large bowel has a normal course and caliber with some scattered colonic diverticula particularly of the sigmoid colon. There is wall thickening seen in the area of the sigmoid colon with the adjacent stranding and thickening of the mesorectal fascia. Vascular/Lymphatic: Aortic atherosclerosis. No enlarged abdominal or pelvic lymph nodes. Reproductive: Uterus is present. No adnexal mass. Increasing soft tissue stranding. Other: No free air.  No developing ascites. Musculoskeletal: Scattered degenerative changes of the spine and pelvis. Stable compression of the inferior endplate of L5 with sclerosis. IMPRESSION: Interval development of ectasia of the right renal collecting system with urothelial thickening and stranding. There are some nonobstructing intrarenal stones but no ureteral stones. Please correlate for any known history including for a urothelial infection. The urinary bladder is also thickened and there is some adjacent stranding. Please correlate for any suggestion a developing stricture. Overall with appearance  would recommend follow up to confirm resolution and exclude secondary pathology. Increasing wall thickening along the rectum with perirectal space stranding and thickening of the mesorectal fascia. Please correlate for known history of radiation. 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 Electronically Signed   By: Karen Kays M.D.   On: 07/06/2023 14:43   DG Chest Port 1 View  Result Date: 07/06/2023 CLINICAL DATA:  Questionable sepsis - evaluate for abnormality. Abdominal pain. EXAM: PORTABLE CHEST 1 VIEW COMPARISON:  08/24/2022. FINDINGS: Bilateral lung fields are clear. Again seen is elevated right hemidiaphragm. Bilateral costophrenic angles are clear. Normal cardio-mediastinal silhouette. No acute osseous abnormalities. The soft tissues are within normal limits. Right-sided CT Port-A-Cath is seen with its tip overlying the upper portion of superior vena cava. IMPRESSION: *No active disease. Electronically Signed   By: Jules Schick M.D.   On: 07/06/2023 14:15     Discharge Exam: Vitals:   07/15/23 2011 07/16/23 0557  BP: (!) 130/56 129/74  Pulse: 88 96  Resp:    Temp: 99 F (37.2 C) 98.8 F (37.1 C)  SpO2: 99% 99%   Vitals:   07/15/23 0957 07/15/23 1401 07/15/23 2011 07/16/23 0557  BP: 127/64 (!) 119/53 (!) 130/56 129/74  Pulse: 80 77 88 96  Resp:  16    Temp:  98.5 F (36.9 C) 99 F (37.2 C) 98.8 F (37.1 C)  TempSrc:  Oral Oral Oral  SpO2:  100% 99% 99%  Weight:      Height:        General: Pt is alert, awake, not in acute distress Cardiovascular: RRR, S1/S2 +, no rubs, no gallops Respiratory: CTA bilaterally, no wheezing, no rhonchi Abdominal: Soft, NT, ND, bowel sounds + Extremities: no edema, no cyanosis    The results of significant diagnostics from this hospitalization (including imaging, microbiology, ancillary and laboratory) are listed below for reference.     Microbiology: Recent Results (from the past 240 hour(s))  Resp panel by RT-PCR (RSV, Flu A&B,  Covid) Anterior Nasal Swab     Status: None   Collection Time: 07/06/23 12:43 PM   Specimen: Anterior Nasal Swab  Result Value Ref Range Status  SARS Coronavirus 2 by RT PCR NEGATIVE NEGATIVE Final    Comment: (NOTE) SARS-CoV-2 target nucleic acids are NOT DETECTED.  The SARS-CoV-2 RNA is generally detectable in upper respiratory specimens during the acute phase of infection. The lowest concentration of SARS-CoV-2 viral copies this assay can detect is 138 copies/mL. A negative result does not preclude SARS-Cov-2 infection and should not be used as the sole basis for treatment or other patient management decisions. A negative result may occur with  improper specimen collection/handling, submission of specimen other than nasopharyngeal swab, presence of viral mutation(s) within the areas targeted by this assay, and inadequate number of viral copies(<138 copies/mL). A negative result must be combined with clinical observations, patient history, and epidemiological information. The expected result is Negative.  Fact Sheet for Patients:  BloggerCourse.com  Fact Sheet for Healthcare Providers:  SeriousBroker.it  This test is no t yet approved or cleared by the [place] FDA and  has been authorized for detection and/or diagnosis of SARS-CoV-2 by FDA under an Emergency Use Authorization (EUA). This EUA will remain  in effect (meaning this test can be used) for the duration of the COVID-19 declaration under Section 564(b)(1) of the Act, 21 U.S.C.section 360bbb-3(b)(1), unless the authorization is terminated  or revoked sooner.       Influenza A by PCR NEGATIVE NEGATIVE Final   Influenza B by PCR NEGATIVE NEGATIVE Final    Comment: (NOTE) The Xpert Xpress SARS-CoV-2/FLU/RSV plus assay is intended as an aid in the diagnosis of influenza from Nasopharyngeal swab specimens and should not be used as a sole basis for treatment. Nasal  washings and aspirates are unacceptable for Xpert Xpress SARS-CoV-2/FLU/RSV testing.  Fact Sheet for Patients: BloggerCourse.com  Fact Sheet for Healthcare Providers: SeriousBroker.it  This test is not yet approved or cleared by the [place] FDA and has been authorized for detection and/or diagnosis of SARS-CoV-2 by FDA under an Emergency Use Authorization (EUA). This EUA will remain in effect (meaning this test can be used) for the duration of the COVID-19 declaration under Section 564(b)(1) of the Act, 21 U.S.C. section 360bbb-3(b)(1), unless the authorization is terminated or revoked.     Resp Syncytial Virus by PCR NEGATIVE NEGATIVE Final    Comment: (NOTE) Fact Sheet for Patients: BloggerCourse.com  Fact Sheet for Healthcare Providers: SeriousBroker.it  This test is not yet approved or cleared by the [place] FDA and has been authorized for detection and/or diagnosis of SARS-CoV-2 by FDA under an Emergency Use Authorization (EUA). This EUA will remain in effect (meaning this test can be used) for the duration of the COVID-19 declaration under Section 564(b)(1) of the Act, 21 U.S.C. section 360bbb-3(b)(1), unless the authorization is terminated or revoked.  Performed at [facility], [address] [place], [place] [address]   Urine Culture (for pregnant, neutropenic or urologic patients or patients with an indwelling urinary catheter)     Status: Abnormal   Collection Time: 07/13/23  5:11 PM   Specimen: Urine, Clean Catch  Result Value Ref Range Status   Specimen Description   Final    URINE, CLEAN CATCH Performed at [facility], [address], [place], [place] [address]    Special Requests   Final    NONE Performed at [facility], [address], [place], [place] [address]    Culture 10,000  COLONIES/mL ENTEROCOCCUS FAECALIS (A)  Final   Report Status 07/16/2023 FINAL  Final   Organism ID, Bacteria ENTEROCOCCUS FAECALIS (A)  Final      Susceptibility   Enterococcus faecalis - MIC*    AMPICILLIN <=2 SENSITIVE Sensitive     NITROFURANTOIN <=16 SENSITIVE Sensitive     VANCOMYCIN 1 SENSITIVE Sensitive     * 10,000 COLONIES/mL ENTEROCOCCUS FAECALIS  C Difficile Quick Screen w PCR reflex     Status: Abnormal   Collection Time: 07/13/23  7:12 PM   Specimen: Urine, Clean Catch; Stool  Result Value Ref Range Status   C Diff antigen POSITIVE (A) NEGATIVE Final    Comment: CALLED TO GIA BONIS, RN   C Diff toxin POSITIVE (A) NEGATIVE Final   C Diff interpretation Toxin producing C. difficile detected.  Final    Comment: Performed at [facility], [address], [place], [place] [address]  Gastrointestinal Panel by PCR , Stool     Status: None   Collection Time: 07/13/23  7:12 PM   Specimen: Urine, Clean Catch; Stool  Result Value Ref Range Status   Campylobacter species NOT DETECTED NOT DETECTED Final   Plesimonas shigelloides NOT DETECTED NOT DETECTED Final   Salmonella species NOT DETECTED NOT DETECTED Final   Yersinia enterocolitica NOT DETECTED NOT DETECTED Final   Vibrio species NOT DETECTED NOT DETECTED Final   Vibrio cholerae NOT DETECTED NOT DETECTED Final   Enteroaggregative E coli (EAEC) NOT DETECTED NOT DETECTED Final   Enteropathogenic E coli (EPEC) NOT DETECTED NOT DETECTED Final   Enterotoxigenic E coli (ETEC) NOT DETECTED NOT DETECTED Final   Shiga like toxin producing E coli (STEC) NOT DETECTED NOT DETECTED Final   Shigella/Enteroinvasive E coli (EIEC) NOT DETECTED NOT DETECTED Final   Cryptosporidium NOT DETECTED NOT DETECTED Final   Cyclospora cayetanensis NOT DETECTED NOT DETECTED Final   Entamoeba histolytica NOT DETECTED NOT DETECTED Final   Giardia lamblia NOT DETECTED NOT DETECTED Final   Adenovirus F40/41 NOT DETECTED NOT DETECTED  Final   Astrovirus NOT DETECTED NOT DETECTED Final   Norovirus GI/GII NOT DETECTED NOT DETECTED Final   Rotavirus A NOT DETECTED NOT DETECTED Final   Sapovirus (I, II, IV, and V) NOT DETECTED NOT DETECTED Final    Comment: Performed at [facility], [address] [place], [place] [address]     Labs: BNP (last 3 results) No results for input(s): "BNP" in the last 8760 hours. Basic Metabolic Panel: Recent Labs  Lab 07/13/23 1822 07/13/23 2305 07/14/23 0455 07/14/23 1410 07/15/23 0317 07/16/23 0339  NA 133* 133* 136  --  139 139  K 2.9* 3.0* 4.3  --  3.6 3.2*  CL 95* 99 105  --  110 106  CO2 25 27 22   --  24 26  GLUCOSE 80 74 228*  --  100* 97  BUN 10 9 7*  --  <5* 5*  CREATININE 1.57* 1.40* 1.44*  --  1.21* 1.19*  CALCIUM 8.4* 7.8* 7.3*  --  7.1* 7.5*  MG 1.4*  --   --  1.7 1.5* 1.8  PHOS  --   --   --  <1.0* 2.6 2.3*   Liver Function Tests: Recent Labs  Lab 07/13/23 1822  AST 28  ALT 12  ALKPHOS 63  BILITOT 0.6  PROT 5.6*  ALBUMIN 2.5*   Recent Labs  Lab 07/13/23 1822  LIPASE 33   No results for input(s): "AMMONIA" in the last 168 hours. CBC: Recent Labs  Lab 07/13/23 1822 07/13/23 2305 07/14/23 0455 07/15/23 0317 07/16/23 0339  WBC 5.3 4.7 4.3 3.7* 3.6*  NEUTROABS  --   --   --   --  1.9  HGB 9.3* 8.6* 8.7* 7.5* 7.6*  HCT 27.9* 27.3* 27.5* 23.9* 24.4*  MCV 98.9 100.4* 100.4* 102.6* 102.1*  PLT 214 221 224 189 194   Cardiac Enzymes: No results for input(s): "CKTOTAL", "CKMB", "CKMBINDEX", "TROPONINI" in the last 168 hours. BNP: Invalid input(s): "POCBNP" CBG: Recent Labs  Lab 07/13/23 2321 07/14/23 0216 07/14/23 0832 07/14/23 1158  GLUCAP 64* 104* 124* 147*   D-Dimer No results for input(s): "DDIMER" in the last 72 hours. Hgb A1c No results for input(s): "HGBA1C" in the last 72 hours. Lipid Profile No results for input(s): "CHOL", "HDL", "LDLCALC", "TRIG", "CHOLHDL", "LDLDIRECT" in the last 72 hours. Thyroid function  studies No results for input(s): "TSH", "T4TOTAL", "T3FREE", "THYROIDAB" in the last 72 hours.  Invalid input(s): "FREET3" Anemia work up Recent Labs    07/14/23 1410  FOLATE 8.2  TIBC 132*  IRON 29   Urinalysis    Component Value Date/Time   COLORURINE AMBER (A) 07/13/2023 1711   APPEARANCEUR HAZY (A) 07/13/2023 1711   LABSPEC 1.006 07/13/2023 1711   PHURINE 5.0 07/13/2023 1711   GLUCOSEU NEGATIVE 07/13/2023 1711   HGBUR MODERATE (A) 07/13/2023 1711   BILIRUBINUR NEGATIVE 07/13/2023 1711   KETONESUR NEGATIVE 07/13/2023 1711   PROTEINUR 30 (A) 07/13/2023 1711   NITRITE POSITIVE (A) 07/13/2023 1711   LEUKOCYTESUR MODERATE (A) 07/13/2023 1711   Sepsis Labs Recent Labs  Lab 07/13/23 2305 07/14/23 0455 07/15/23 0317 07/16/23 0339  WBC 4.7 4.3 3.7* 3.6*   Microbiology Recent Results (from the past 240 hour(s))  Resp panel by RT-PCR (RSV, Flu A&B, Covid) Anterior Nasal Swab     Status: None   Collection Time: 07/06/23 12:43 PM   Specimen: Anterior Nasal Swab  Result Value Ref Range Status   SARS Coronavirus 2 by RT PCR NEGATIVE NEGATIVE Final    Comment: (NOTE) SARS-CoV-2 target nucleic acids are NOT DETECTED.  The SARS-CoV-2 RNA is generally detectable in upper respiratory specimens during the acute phase of infection. The lowest concentration of SARS-CoV-2 viral copies this assay can detect is 138 copies/mL. A negative result does not preclude SARS-Cov-2 infection and should not be used as the sole basis for treatment or other patient management decisions. A negative result may occur with  improper specimen collection/handling, submission of specimen other than nasopharyngeal swab, presence of viral mutation(s) within the areas targeted by this assay, and inadequate number of viral copies(<138 copies/mL). A negative result must be combined with clinical observations, patient history, and epidemiological information. The expected result is Negative.  Fact Sheet  for Patients:  BloggerCourse.com  Fact Sheet for Healthcare Providers:  SeriousBroker.it  This test is no t yet approved or cleared by the [place] FDA and  has been authorized for detection and/or diagnosis of SARS-CoV-2 by FDA under an Emergency Use Authorization (EUA). This EUA will remain  in effect (meaning this test can be used) for the duration of the COVID-19 declaration under Section 564(b)(1) of the Act, 21 U.S.C.section 360bbb-3(b)(1), unless the authorization is terminated  or revoked sooner.       Influenza A by PCR NEGATIVE NEGATIVE Final   Influenza B by PCR NEGATIVE NEGATIVE Final    Comment: (NOTE) The Xpert Xpress SARS-CoV-2/FLU/RSV plus assay is intended as an aid in the diagnosis of influenza from Nasopharyngeal swab specimens and should not be used as a sole basis for treatment. Nasal washings and aspirates are unacceptable for Xpert  Xpress SARS-CoV-2/FLU/RSV testing.  Fact Sheet for Patients: BloggerCourse.com  Fact Sheet for Healthcare Providers: SeriousBroker.it  This test is not yet approved or cleared by the [place] FDA and has been authorized for detection and/or diagnosis of SARS-CoV-2 by FDA under an Emergency Use Authorization (EUA). This EUA will remain in effect (meaning this test can be used) for the duration of the COVID-19 declaration under Section 564(b)(1) of the Act, 21 U.S.C. section 360bbb-3(b)(1), unless the authorization is terminated or revoked.     Resp Syncytial Virus by PCR NEGATIVE NEGATIVE Final    Comment: (NOTE) Fact Sheet for Patients: BloggerCourse.com  Fact Sheet for Healthcare Providers: SeriousBroker.it  This test is not yet approved or cleared by the [place] FDA and has been authorized for detection and/or diagnosis of SARS-CoV-2 by FDA under an  Emergency Use Authorization (EUA). This EUA will remain in effect (meaning this test can be used) for the duration of the COVID-19 declaration under Section 564(b)(1) of the Act, 21 U.S.C. section 360bbb-3(b)(1), unless the authorization is terminated or revoked.  Performed at [facility], [address] [place], [place] [address]   Urine Culture (for pregnant, neutropenic or urologic patients or patients with an indwelling urinary catheter)     Status: Abnormal   Collection Time: 07/13/23  5:11 PM   Specimen: Urine, Clean Catch  Result Value Ref Range Status   Specimen Description   Final    URINE, CLEAN CATCH Performed at [facility], [address] [place], [place] [address]    Special Requests   Final    NONE Performed at [facility], [address] [place], [place] [address]    Culture 10,000 COLONIES/mL ENTEROCOCCUS FAECALIS (A)  Final   Report Status 07/16/2023 FINAL  Final   Organism ID, Bacteria ENTEROCOCCUS FAECALIS (A)  Final      Susceptibility   Enterococcus faecalis - MIC*    AMPICILLIN <=2 SENSITIVE Sensitive     NITROFURANTOIN <=16 SENSITIVE Sensitive     VANCOMYCIN 1 SENSITIVE Sensitive     * 10,000 COLONIES/mL ENTEROCOCCUS FAECALIS  C Difficile Quick Screen w PCR reflex     Status: Abnormal   Collection Time: 07/13/23  7:12 PM   Specimen: Urine, Clean Catch; Stool  Result Value Ref Range Status   C Diff antigen POSITIVE (A) NEGATIVE Final    Comment: CALLED TO GIA BONIS, RN   C Diff toxin POSITIVE (A) NEGATIVE Final   C Diff interpretation Toxin producing C. difficile detected.  Final    Comment: Performed at [facility], [address], [place], [place] [address]  Gastrointestinal Panel by PCR , Stool     Status: None   Collection Time: 07/13/23  7:12 PM   Specimen: Urine, Clean Catch; Stool  Result Value Ref Range Status   Campylobacter species NOT DETECTED NOT DETECTED  Final   Plesimonas shigelloides NOT DETECTED NOT DETECTED Final   Salmonella species NOT DETECTED NOT DETECTED Final   Yersinia enterocolitica NOT DETECTED NOT DETECTED Final   Vibrio species NOT DETECTED NOT DETECTED Final   Vibrio cholerae NOT DETECTED NOT DETECTED Final   Enteroaggregative E coli (EAEC) NOT DETECTED NOT DETECTED Final   Enteropathogenic E coli (EPEC) NOT DETECTED NOT DETECTED Final   Enterotoxigenic E coli (ETEC) NOT DETECTED NOT DETECTED Final   Shiga like toxin producing E coli (STEC) NOT DETECTED NOT DETECTED Final   Shigella/Enteroinvasive E coli (EIEC) NOT DETECTED NOT DETECTED Final  Cryptosporidium NOT DETECTED NOT DETECTED Final   Cyclospora cayetanensis NOT DETECTED NOT DETECTED Final   Entamoeba histolytica NOT DETECTED NOT DETECTED Final   Giardia lamblia NOT DETECTED NOT DETECTED Final   Adenovirus F40/41 NOT DETECTED NOT DETECTED Final   Astrovirus NOT DETECTED NOT DETECTED Final   Norovirus GI/GII NOT DETECTED NOT DETECTED Final   Rotavirus A NOT DETECTED NOT DETECTED Final   Sapovirus (I, II, IV, and V) NOT DETECTED NOT DETECTED Final    Comment: Performed at [facility], [address] [place], [place] [address]    FURTHER DISCHARGE INSTRUCTIONS:   Get Medicines reviewed and adjusted: Please take all your medications with you for your next visit with your Primary MD   Laboratory/radiological data: Please request your Primary MD to go over all hospital tests and procedure/radiological results at the follow up, please ask your Primary MD to get all Hospital records sent to his/her office.   In some cases, they will be blood work, cultures and biopsy results pending at the time of your discharge. Please request that your primary care M.D. goes through all the records of your hospital data and follows up on these results.   Also Note the following: If you experience worsening of your admission symptoms, develop shortness of breath, life  threatening emergency, suicidal or homicidal thoughts you must seek medical attention immediately by calling 911 or calling your MD immediately  if symptoms less severe.   You must read complete instructions/literature along with all the possible adverse reactions/side effects for all the Medicines you take and that have been prescribed to you. Take any new Medicines after you have completely understood and accpet all the possible adverse reactions/side effects.    Do not drive when taking Pain medications or sleeping medications (Benzodaizepines)   Do not take more than prescribed Pain, Sleep and Anxiety Medications. It is not advisable to combine anxiety,sleep and pain medications without talking with your primary care practitioner   Special Instructions: If you have smoked or chewed Tobacco  in the last 2 yrs please stop smoking, stop any regular Alcohol  and or any Recreational drug use.   Wear Seat belts while driving.   Please note: You were cared for by a hospitalist during your hospital stay. Once you are discharged, your primary care physician will handle any further medical issues. Please note that NO REFILLS for any discharge medications will be authorized once you are discharged, as it is imperative that you return to your primary care physician (or establish a relationship with a primary care physician if you do not have one) for your post hospital discharge needs so that they can reassess your need for medications and monitor your lab values  Time coordinating discharge: Over 30 minutes  SIGNED:   Hughie Closs, MD  [facility] 07/16/2023, 11:18 AM *Please note that this is a verbal dictation therefore any spelling or grammatical errors are due to the "Dragon Medical One" system interpretation. If 7PM-7AM, please contact night-coverage [email or web address]

## 2023-07-16 NOTE — Progress Notes (Signed)
Mobility Specialist - Progress Note   07/16/23 1000  Mobility  Activity Ambulated with assistance in hallway  Level of Assistance Contact guard assist, steadying assist  Assistive Device Other (Comment) (hand held assist)  Distance Ambulated (ft) 200 ft  Range of Motion/Exercises Active  Activity Response Tolerated well  Mobility Referral Yes  $Mobility charge 1 Mobility  Mobility Specialist Start Time (ACUTE ONLY) 0941  Mobility Specialist Stop Time (ACUTE ONLY) 1002  Mobility Specialist Time Calculation (min) (ACUTE ONLY) 21 min   Received in bed and agreed to mobility, had no issues throughout session. Returned to window bench with all needs met, family in room and requesting another session in PM.   Marilynne Halsted Mobility Specialist

## 2023-07-16 NOTE — Progress Notes (Signed)
   07/16/23 1050  TOC Brief Assessment  Insurance and Status Reviewed  Patient has primary care physician Yes  Home environment has been reviewed Single family home w/ spouse  Prior level of function: Independent  Prior/Current Home Services No current home services  Social Determinants of Health Reivew SDOH reviewed no interventions necessary  Readmission risk has been reviewed Yes  Transition of care needs no transition of care needs at this time

## 2023-07-16 NOTE — Plan of Care (Signed)
  Problem: Fluid Volume: Goal: Ability to maintain a balanced intake and output will improve Outcome: Progressing   Problem: Metabolic: Goal: Ability to maintain appropriate glucose levels will improve Outcome: Progressing   Problem: Education: Goal: Knowledge of General Education information will improve Description: Including pain rating scale, medication(s)/side effects and non-pharmacologic comfort measures Outcome: Progressing   Problem: Clinical Measurements: Goal: Will remain free from infection Outcome: Progressing   Problem: Nutrition: Goal: Adequate nutrition will be maintained Outcome: Progressing   Problem: Elimination: Goal: Will not experience complications related to bowel motility Outcome: Progressing

## 2023-07-17 ENCOUNTER — Other Ambulatory Visit: Payer: Self-pay | Admitting: Hematology and Oncology

## 2023-07-18 ENCOUNTER — Encounter: Payer: Self-pay | Admitting: Hematology and Oncology

## 2023-07-24 ENCOUNTER — Inpatient Hospital Stay: Payer: Medicare PPO | Attending: Radiation Oncology | Admitting: Hematology and Oncology

## 2023-07-24 ENCOUNTER — Encounter: Payer: Self-pay | Admitting: Hematology and Oncology

## 2023-07-24 ENCOUNTER — Inpatient Hospital Stay: Payer: Medicare PPO

## 2023-07-24 VITALS — BP 142/73 | HR 57 | Temp 98.5°F | Resp 18 | Ht 62.0 in | Wt 192.6 lb

## 2023-07-24 DIAGNOSIS — C538 Malignant neoplasm of overlapping sites of cervix uteri: Secondary | ICD-10-CM | POA: Diagnosis not present

## 2023-07-24 DIAGNOSIS — Z79620 Long term (current) use of immunosuppressive biologic: Secondary | ICD-10-CM | POA: Insufficient documentation

## 2023-07-24 DIAGNOSIS — C539 Malignant neoplasm of cervix uteri, unspecified: Secondary | ICD-10-CM | POA: Diagnosis not present

## 2023-07-24 DIAGNOSIS — R300 Dysuria: Secondary | ICD-10-CM | POA: Diagnosis not present

## 2023-07-24 DIAGNOSIS — Z5112 Encounter for antineoplastic immunotherapy: Secondary | ICD-10-CM | POA: Diagnosis not present

## 2023-07-24 DIAGNOSIS — I10 Essential (primary) hypertension: Secondary | ICD-10-CM | POA: Diagnosis not present

## 2023-07-24 DIAGNOSIS — D61818 Other pancytopenia: Secondary | ICD-10-CM

## 2023-07-24 LAB — CBC WITH DIFFERENTIAL (CANCER CENTER ONLY)
Abs Immature Granulocytes: 0.01 10*3/uL (ref 0.00–0.07)
Basophils Absolute: 0 10*3/uL (ref 0.0–0.1)
Basophils Relative: 0 %
Eosinophils Absolute: 0.4 10*3/uL (ref 0.0–0.5)
Eosinophils Relative: 6 %
HCT: 31.9 % — ABNORMAL LOW (ref 36.0–46.0)
Hemoglobin: 10.1 g/dL — ABNORMAL LOW (ref 12.0–15.0)
Immature Granulocytes: 0 %
Lymphocytes Relative: 29 %
Lymphs Abs: 1.7 10*3/uL (ref 0.7–4.0)
MCH: 32.4 pg (ref 26.0–34.0)
MCHC: 31.7 g/dL (ref 30.0–36.0)
MCV: 102.2 fL — ABNORMAL HIGH (ref 80.0–100.0)
Monocytes Absolute: 0.5 10*3/uL (ref 0.1–1.0)
Monocytes Relative: 8 %
Neutro Abs: 3.4 10*3/uL (ref 1.7–7.7)
Neutrophils Relative %: 57 %
Platelet Count: 284 10*3/uL (ref 150–400)
RBC: 3.12 MIL/uL — ABNORMAL LOW (ref 3.87–5.11)
RDW: 17.1 % — ABNORMAL HIGH (ref 11.5–15.5)
WBC Count: 6 10*3/uL (ref 4.0–10.5)
nRBC: 0 % (ref 0.0–0.2)

## 2023-07-24 LAB — TSH: TSH: 5.093 u[IU]/mL — ABNORMAL HIGH (ref 0.350–4.500)

## 2023-07-24 LAB — CMP (CANCER CENTER ONLY)
ALT: 9 U/L (ref 0–44)
AST: 21 U/L (ref 15–41)
Albumin: 3.5 g/dL (ref 3.5–5.0)
Alkaline Phosphatase: 44 U/L (ref 38–126)
Anion gap: 6 (ref 5–15)
BUN: 13 mg/dL (ref 8–23)
CO2: 26 mmol/L (ref 22–32)
Calcium: 9.4 mg/dL (ref 8.9–10.3)
Chloride: 106 mmol/L (ref 98–111)
Creatinine: 0.99 mg/dL (ref 0.44–1.00)
GFR, Estimated: >60 mL/min (ref >60)
Glucose, Bld: 88 mg/dL (ref 70–99)
Potassium: 4.2 mmol/L (ref 3.5–5.1)
Sodium: 138 mmol/L (ref 135–145)
Total Bilirubin: 0.6 mg/dL (ref <1.2)
Total Protein: 6.6 g/dL (ref 6.5–8.1)

## 2023-07-24 LAB — TOTAL PROTEIN, URINE DIPSTICK: Protein, ur: NEGATIVE mg/dL

## 2023-07-24 MED ORDER — SODIUM CHLORIDE 0.9% FLUSH
10.0000 mL | Freq: Once | INTRAVENOUS | Status: AC
  Administered 2023-07-24: 10 mL

## 2023-07-24 MED ORDER — SODIUM CHLORIDE 0.9 % IV SOLN: Freq: Once | INTRAVENOUS | Status: AC

## 2023-07-24 MED ORDER — PEMBROLIZUMAB CHEMO INJECTION 100 MG/4ML
200.0000 mg | Freq: Once | INTRAVENOUS | Status: AC
  Administered 2023-07-24: 200 mg via INTRAVENOUS
  Filled 2023-07-24: qty 200

## 2023-07-24 NOTE — Assessment & Plan Note (Signed)
The patient has developed C. difficile infection recently but is healing well I recommend holding off giving her bevacizumab today We will continue immunotherapy only I will see her back in 3 weeks for further follow-up

## 2023-07-24 NOTE — Progress Notes (Signed)
[facility] OFFICE PROGRESS NOTE  Patient Care Team: Marletta Lor, NP as PCP - General (Nurse Practitioner)  ASSESSMENT & PLAN:  Cervical cancer [facility]) The patient has developed C. difficile infection recently but is healing well I recommend holding off giving her bevacizumab today We will continue immunotherapy only I will see her back in 3 weeks for further follow-up  Pancytopenia, acquired (HCC) Her blood count is recovering well We will continue treatment as scheduled  Essential hypertension Her blood pressure control has improved She has undetectable urine protein We will continue her current blood pressure medication and I will hold off bevacizumab until next month  No orders of the defined types were placed in this encounter.   All questions were answered. The patient knows to call the clinic with any problems, questions or concerns. The total time spent in the appointment was 30 minutes encounter with patients including review of chart and various tests results, discussions about plan of care and coordination of care plan   Artis Delay, MD 07/24/2023 12:51 PM  INTERVAL HISTORY: Please see below for problem oriented charting. she returns for treatment follow-up She had recurrent admission to the hospital recently due to C. difficile infection She is now fully recovered from diarrhea and has no recent nausea vomiting She continues to have mild dysuria Her blood pressure control is excellent I reviewed recent imaging study and discussed the rationale behind with holding bevacizumab today  REVIEW OF SYSTEMS:   Constitutional: Denies fevers, chills or abnormal weight loss Eyes: Denies blurriness of vision Ears, nose, mouth, throat, and face: Denies mucositis or sore throat Respiratory: Denies cough, dyspnea or wheezes Cardiovascular: Denies palpitation, chest discomfort or lower extremity swelling Gastrointestinal:  Denies nausea, heartburn or change in  bowel habits Skin: Denies abnormal skin rashes Lymphatics: Denies new lymphadenopathy or easy bruising Neurological:Denies numbness, tingling or new weaknesses Behavioral/Psych: Mood is stable, no new changes  All other systems were reviewed with the patient and are negative.  I have reviewed the past medical history, past surgical history, social history and family history with the patient and they are unchanged from previous note.  ALLERGIES:  is allergic to lisinopril.  MEDICATIONS:  Current Outpatient Medications  Medication Sig Dispense Refill   antiseptic oral rinse (BIOTENE) LIQD 15 mLs by Mouth Rinse route as needed for dry mouth. 120 mL 0   atenolol (TENORMIN) 100 MG tablet Take 1 tablet (100 mg total) by mouth daily. 90 tablet 0   cholecalciferol (VITAMIN D3) 25 MCG (1000 UNIT) tablet Take 2,000 Units by mouth daily.     diphenhydrAMINE (BENADRYL ALLERGY) 25 MG tablet Take 2 tablets (50 mg total) by mouth every 6 (six) hours as needed for allergies. 30 tablet 0   ezetimibe (ZETIA) 10 MG tablet Take 10 mg by mouth in the morning.     glimepiride (AMARYL) 1 MG tablet Take 1 mg by mouth daily with breakfast.     lidocaine-prilocaine (EMLA) cream Apply 1 Application topically as needed (for port access). 30 g 3   magic mouthwash (nystatin, diphenhydrAMINE, alum & mag hydroxide) suspension mixture Take 5 mLs by mouth 4 (four) times daily as needed for mouth pain. 200 mL 0   ondansetron (ZOFRAN) 8 MG tablet Take 1 tablet (8 mg total) by mouth every 8 (eight) hours as needed for nausea or vomiting. Start on the third day after chemotherapy. 30 tablet 1   prochlorperazine (COMPAZINE) 10 MG tablet Take 1 tablet (10 mg total) by mouth  every 6 (six) hours as needed for nausea or vomiting. 30 tablet 1   rosuvastatin (CRESTOR) 40 MG tablet Take 40 mg by mouth at bedtime.     saccharomyces boulardii (FLORASTOR) 250 MG capsule Take 1 capsule (250 mg total) by mouth 2 (two) times daily for 10  days. 20 capsule 0   sitaGLIPtin (JANUVIA) 100 MG tablet Take 50 mg by mouth in the morning.     traMADol (ULTRAM) 50 MG tablet Take 2 tablets (100 mg total) by mouth every 6 (six) hours as needed. (Patient taking differently: Take 100 mg by mouth every 6 (six) hours as needed for moderate pain (pain score 4-6).) 90 tablet 1   TYLENOL 500 MG tablet Take 500-1,000 mg by mouth every 6 (six) hours as needed for mild pain or headache.     vitamin B-12 (CYANOCOBALAMIN) 1000 MCG tablet Take 1,000 mcg by mouth in the morning. (Patient not taking: Reported on 07/13/2023)     No current facility-administered medications for this visit.    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   08/27/2020 Imaging   MRI pelvis Large cervical mass with pelvic adenopathy, at least T2B N1 disease. Question of involvement of posterior urinary bladder (T4) for which additional images have been requested. Patient will return for additional images to answer this question. (Thinner section axial T2 without fat sat and sagittal T2 weighted imaging also without fat saturation.)   Small field-of-view images show that the parametrial extension comes in very close proximity to the RIGHT ureter.   08/30/2020 Initial Diagnosis   Cervical cancer (HCC)   08/30/2020 Cancer Staging   Staging form: Cervix Uteri, AJCC Version 9 - Clinical stage from 08/30/2020: FIGO Stage IVB (rcT2, cN1, pM1) - Signed by Artis Delay, MD on 09/01/2022 Stage prefix: Recurrence   09/01/2020 PET scan   1. Large cervical mass extending into the endometrial canal to the level of the uterine fundus, associated with pelvic adenopathy to the level of the RIGHT common iliac chain. Please refer to  MRI for further detail. 2. No signs of solid organ uptake or metastatic disease to the chest. 3. Subcutaneous nodule with marked increased metabolic activity, correlation with direct clinical inspection is suggested to exclude cutaneous neoplasm or subcutaneous nodule in this location. Complicated sebaceous cyst could also potentially have this appearance. 4. Uptake in the anal canal could likely physiologic. Given the intense nature of the uptake would suggest correlation with direct clinical inspection/examination. 5. Uptake in axillary lymph nodes with activity less than mediastinal blood pool likely small reactive lymph nodes, attention on follow-up. Morphologic features also with benign appearance.   09/16/2020 Procedure   Successful placement of a power injectable Port-A-Cath via the right internal jugular vein. The catheter is ready for immediate use.     09/20/2020 - 10/25/2020 Chemotherapy   She received cisplatin chemo with concurrent radiation       09/22/2020 - 12/08/2020 Radiation Therapy   Radiation Treatment Dates: 09/22/2020 through 12/08/2020   Site: Pelvis Technique: IMRT Total Dose (Gy): 45/45 Dose per Fx (Gy): 1.8 Completed Fx: 25/25 Beam Energies: 6x   Site: pelvic boost Technique: IMRT Total Dose (Gy): 7.2/7.2 Dose per Fx (Gy): 1.8 Completed Fx: 4/4 Beam Energies: 6x   Site: Cervix boost Technique: HDR-brachytherapy  Total Dose (Gy): 27.5/27.5 Dose per Fx (Gy): 5.5 Completed Fx: 5/5 Beam Energies: Ir-192     03/17/2021 PET scan   1. Mark positive response to therapy. 2. Resolution of metabolic activity within the uterine body. 3. Resolution of size and metabolic activity of pelvic lymph nodes. 4. No evidence of new disease outside of the pelvis.   08/10/2022 PET scan   1. New hypermetabolic bilateral pulmonary nodules and hypermetabolic cervical and hilar lymph nodes, consistent with metastatic disease. 2. Hypermetabolic focus in the anterior right third  costochondral junction without discrete CT correlate is also compatible with metastatic disease. 3. Prior hysterectomy without suspicious hypermetabolic nodularity along the vaginal cuff. 4.  Aortic Atherosclerosis (ICD10-I70.0).   08/24/2022 Pathology Results   A. SOFT TISSUE NODULE, RIGHT CHEST WALL, NEEDLE CORE BIOPSY:  Poorly differentiated carcinoma.  See comment.   COMMENT:  The carcinoma is characterized by nest of malignant epithelioid cells which focally has features suggestive of squamous differentiation morphologically.  Immunohistochemistry is positive with cytokeratin 7, PAX8, MOC-31 and estrogen receptor.  There is patchy positivity with p16, progesterone receptor and TTF-1.  The tumor is negative with p40, cytokeratin 5/6, p63, CDX2, cytokeratin 20 and WT-1.  The immunophenotype is consistent with adenocarcinoma and suggestive of a gynecologic primary.    09/08/2022 -  Chemotherapy   Patient is on Treatment Plan : OVARIAN Carboplatin + Paclitaxel + Bevacizumab q21d      11/07/2022 Imaging   1. Decreased size of the pulmonary nodules and mediastinal adenopathy, consistent with treatment response. 2. Similar appearance of the previously hypermetabolic soft tissue subjacent to the right anterior third costochondral cartilage. 3. No convincing evidence of new or progressive disease in the chest, abdomen or pelvis. 4. Rectal wall thickening with stranding in the mesorectal fat and diffuse thickening of the urinary bladder wall, nonspecific but possibly reflecting post radiation change. 5. Nonobstructive right renal stones measure up to 6 mm in the renal pelvis. 6. Colonic diverticulosis without findings of acute diverticulitis. 7.  Aortic Atherosclerosis (ICD10-I70.0).   02/26/2023 Imaging   CT CHEST ABDOMEN PELVIS W CONTRAST  Result Date: 02/24/2023 CLINICAL DATA:  Cervical cancer, assess treatment response * Tracking Code: BO * EXAM: CT CHEST, ABDOMEN, AND PELVIS WITH CONTRAST  TECHNIQUE: Multidetector CT imaging of the chest, abdomen and pelvis was performed following the standard protocol during bolus administration of intravenous contrast. RADIATION DOSE REDUCTION: This exam was performed according to the departmental dose-optimization program which includes automated exposure control, adjustment of the mA and/or kV according to patient size and/or use of iterative reconstruction technique. CONTRAST:  OMNIPAQUE IOHEXOL 300 MG/ML  SOLN COMPARISON:  CT chest abdomen pelvis, 11/07/2022, PET-CT, 08/10/2022 FINDINGS: CT CHEST FINDINGS Cardiovascular: Right chest port catheter. Normal heart size. No pericardial effusion. Mediastinum/Nodes: No persistently enlarged mediastinal, hilar, or axillary lymph nodes. Benign calcified left hilar lymph nodes. Small hiatal hernia. Thyroid gland, trachea, and esophagus demonstrate no significant findings. Lungs/Pleura: Near complete resolution of previously seen bilateral pulmonary nodules, only tiny treated residual remaining, for example a 0.5 cm irregular nodule of the peripheral right apex (series 4, image 28) and a linear residua in the medial left upper lobe (series 4, image 42) benign calcified nodule of the anterior left upper lobe, for which no further follow-up or characterization is required (series 4, image 46). Scarring and volume loss of the left lung base (series 4, image 105). No pleural effusion or pneumothorax. Musculoskeletal: No chest wall abnormality. Previously FDG avid soft tissue nodule in  the anterior right third chondral cartilage is not well appreciated by today's CT (series 2, image 24). No acute osseous findings. CT ABDOMEN PELVIS FINDINGS Hepatobiliary: No solid liver abnormality is seen. No gallstones, gallbladder wall thickening, or biliary dilatation. Pancreas: Unremarkable. No pancreatic ductal dilatation or surrounding inflammatory changes. Spleen: Normal in size. Punctuate granulomatous calcifications of the  spleen. Adrenals/Urinary Tract: Adrenal glands are unremarkable. Nonobstructive calculi of the superior pole of the right kidney as well as the right renal pelvis, renal pelvic calculus measuring 0.8 cm (series 2, image 66). The left kidney is normal, without renal calculi, solid lesion, or hydronephrosis. Unchanged diffuse bladder wall thickening with adjacent fat stranding (series 6, image 106) Stomach/Bowel: Stomach is within normal limits. Appendix is not clearly visualized and may be surgically absent. No evidence of bowel wall thickening, distention, or inflammatory changes. Moderate burden of stool throughout the colon and rectum. Sigmoid diverticulosis. Unchanged long segment wall thickening involving the distal sigmoid colon and rectum, particularly with circumferential wall thickening and perirectal fat spiculation of the rectum (series 2, image 101) Vascular/Lymphatic: Aortic atherosclerosis. No enlarged abdominal or pelvic lymph nodes. Reproductive: No directly visualized cervical mass. Other: No abdominal wall hernia or abnormality. No ascites. Musculoskeletal: No acute osseous findings. Unchanged sclerotic inferior endplate deformity of L5 (series 6, image 100). IMPRESSION: 1. No directly visualized cervical mass. 2. Near complete resolution of previously seen pulmonary nodules, with only small residua, consistent with continued treatment response of pulmonary metastatic disease. 3. No persistently enlarged mediastinal lymph nodes, consistent with sustained treatment response of nodal metastatic disease. 4. No appreciable CT correlate of a previously seen FDG avid soft tissue nodule within the anterior right third costochondral cartilage. 5. Unchanged long segment wall thickening involving the distal sigmoid colon and rectum, particularly with circumferential wall thickening and perirectal fat spiculation of the rectum, as well as diffuse bladder wall thickening. 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 Attention on follow-up. 6. Nonobstructive right nephrolithiasis. Aortic Atherosclerosis (ICD10-I70.0). Electronically Signed   By: Jearld Lesch M.D.   On: 02/24/2023 16:31      05/17/2023 Imaging   CT CHEST ABDOMEN PELVIS W CONTRAST  Result Date: 05/20/2023 CLINICAL DATA:  History of cervical cancer, assess treatment response. * Tracking Code: BO * EXAM: CT CHEST, ABDOMEN, AND PELVIS WITH CONTRAST TECHNIQUE: Multidetector CT imaging of the chest, abdomen and pelvis was performed following the standard protocol during bolus administration of intravenous contrast. RADIATION DOSE REDUCTION: This exam was performed according to the departmental dose-optimization program which includes automated exposure control, adjustment of the mA and/or kV according to patient size and/or use of iterative reconstruction technique. CONTRAST:  OMNIPAQUE IOHEXOL 300 MG/ML  SOLN COMPARISON:  Multiple priors including CT February 23, 2023. FINDINGS: CT CHEST FINDINGS Cardiovascular: Accessed right chest Port-A-Cath with tip at the superior cavoatrial junction no central pulmonary embolus on this nondedicated study. Normal size heart. No significant pericardial effusion/thickening. Mediastinum/Nodes: No suspicious thyroid nodule. No pathologically enlarged mediastinal, hilar or axillary lymph nodes small hiatal hernia Lungs/Pleura: Stable pulmonary nodules. For reference the previously indexed right upper lobe pulmonary nodule measures 4 mm on image 36/6, unchanged. Cluster of new tiny nodules in the right lower lobe measure up to 3 mm on image 85/6. Scattered atelectasis/scarring. Left upper lobe calcified granuloma. No pleural effusion. No pneumothorax Musculoskeletal: No aggressive lytic or blastic lesion of bone. Multilevel degenerative changes spine. CT ABDOMEN PELVIS FINDINGS Hepatobiliary: No suspicious hepatic lesion. Gallbladder is unremarkable. No biliary ductal  dilation.  Pancreas: No pancreatic ductal dilation or evidence of acute inflammation. Spleen: Calcified splenic granulomata Adrenals/Urinary Tract: Bilateral adrenal glands appear normal. Nonobstructive bilateral renal calculi including a 7 mm calculus in the right renal pelvis. Similar perivesicular stranding besides a nondistended urinary bladder Stomach/Bowel: Stomach is unremarkable for degree of distension. No pathologic dilation of small or large bowel. Colonic diverticulosis. Similar mild wall thickening of the distal sigmoid colon/rectum with adjacent fat stranding. Vascular/Lymphatic: Aortic atherosclerosis. Smooth IVC contours. The portal, splenic and superior mesenteric veins are patent. No pathologically enlarged abdominal or pelvic lymph nodes. Reproductive: No discrete cervical mass. No suspicious adnexal lesion. Other: No significant abdominopelvic free fluid. No discrete peritoneal or omental nodularity Musculoskeletal: No aggressive lytic or blastic lesion of bone. Similar sclerotic endplate deformity at L5. Probable postradiation change in the sacrum. Multilevel degenerative change of the spine. IMPRESSION: 1. Previously indexed and non indexed pulmonary nodules are stable. Cluster of new tiny nodules in the right lower lobe measure up to 3 mm, favored infectious/inflammatory. Suggest attention on short-term interval follow-up chest CT. 2. No convincing evidence of new or progressive metastatic disease in the chest, abdomen or pelvis. 3. Similar mild perivesicular stranding and mild wall thickening of the distal sigmoid colon/rectum with adjacent fat stranding, most consistent with postradiation proctitis/cystitis. 4. Nonobstructive bilateral renal calculi including a 7 mm calculus in the right renal pelvis. 5.  Aortic Atherosclerosis (ICD10-I70.0). Electronically Signed   By: Maudry Mayhew M.D.   On: 05/20/2023 15:13      07/06/2023 Imaging   DG Abd 2 Views  Result Date: 07/13/2023 CLINICAL DATA:   Vomiting. EXAM: ABDOMEN - 2 VIEW COMPARISON:  None Available. FINDINGS: No bowel dilatation or evidence of obstruction. No free air. Radiopaque calculi over the right kidney measure up to 6 mm over the lower pole. The soft tissues are unremarkable. No acute osseous pathology. IMPRESSION: 1. Nonobstructive bowel gas pattern. 2. Right renal calculi. Electronically Signed   By: Elgie Collard M.D.   On: 07/13/2023 23:13   CT ABDOMEN PELVIS W CONTRAST  Result Date: 07/06/2023 CLINICAL DATA:  Left lower quadrant pain. Fever, nausea, vomiting and diarrhea. History of cervical cancer with lung metastases. EXAM: CT ABDOMEN AND PELVIS WITH CONTRAST TECHNIQUE: Multidetector CT imaging of the abdomen and pelvis was performed using the standard protocol following bolus administration of intravenous contrast. RADIATION DOSE REDUCTION: This exam was performed according to the departmental dose-optimization program which includes automated exposure control, adjustment of the mA and/or kV according to patient size and/or use of iterative reconstruction technique. CONTRAST:  OMNIPAQUE IOHEXOL 300 MG/ML  SOLN COMPARISON:  CT 05/17/2023 and older. FINDINGS: Lower chest: Lung bases are grossly clear. No pleural effusion. Trace pericardial fluid. Hepatobiliary: Fatty liver infiltration. No space-occupying liver lesion. Hepatic granulomas. Patent portal vein. Gallbladder is distended. Pancreas: Unremarkable. No pancreatic ductal dilatation or surrounding inflammatory changes. Spleen: Splenic granulomas are identified. Small splenule anteriorly and in the hilum. Adrenals/Urinary Tract: Adrenal glands are preserved. No left-sided enhancing renal mass or collecting system dilatation. The left ureter has a normal course and caliber down to the bladder. The bladder is contracted with some wall thickening and stranding. Please correlate for any clinical evidence of cystitis. Bifid left renal collecting system. The previous 7 mm  stone in the right renal pelvis is now in the lower pole of the right kidney. Second nonobstructing upper pole stone is stable. However there is new moderate right renal collecting system dilatation with increasing perinephric stranding. There is  some urothelial thickening as well along the course of the right renal pelvis and ureter. The ureter does slowly taper to a more normal caliber towards the UVJ. No distal ureteral stone. Please correlate for any signs of urothelial infection. Stomach/Bowel: Stomach is relatively decompressed. The small bowel is nondilated. Large bowel has a normal course and caliber with some scattered colonic diverticula particularly of the sigmoid colon. There is wall thickening seen in the area of the sigmoid colon with the adjacent stranding and thickening of the mesorectal fascia. Vascular/Lymphatic: Aortic atherosclerosis. No enlarged abdominal or pelvic lymph nodes. Reproductive: Uterus is present. No adnexal mass. Increasing soft tissue stranding. Other: No free air.  No developing ascites. Musculoskeletal: Scattered degenerative changes of the spine and pelvis. Stable compression of the inferior endplate of L5 with sclerosis. IMPRESSION: Interval development of ectasia of the right renal collecting system with urothelial thickening and stranding. There are some nonobstructing intrarenal stones but no ureteral stones. Please correlate for any known history including for a urothelial infection. The urinary bladder is also thickened and there is some adjacent stranding. Please correlate for any suggestion a developing stricture. Overall with appearance would recommend follow up to confirm resolution and exclude secondary pathology. Increasing wall thickening along the rectum with perirectal space stranding and thickening of the mesorectal fascia. Please correlate for known history of radiation. 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 Electronically Signed   By: Karen Kays M.D.   On:  07/06/2023 14:43   DG Chest Port 1 View  Result Date: 07/06/2023 CLINICAL DATA:  Questionable sepsis - evaluate for abnormality. Abdominal pain. EXAM: PORTABLE CHEST 1 VIEW COMPARISON:  08/24/2022. FINDINGS: Bilateral lung fields are clear. Again seen is elevated right hemidiaphragm. Bilateral costophrenic angles are clear. Normal cardio-mediastinal silhouette. No acute osseous abnormalities. The soft tissues are within normal limits. Right-sided CT Port-A-Cath is seen with its tip overlying the upper portion of superior vena cava. IMPRESSION: *No active disease. Electronically Signed   By: Jules Schick M.D.   On: 07/06/2023 14:15      Malignant neoplasm of cervix (HCC)  09/01/2020 Initial Diagnosis   Malignant neoplasm of cervix (HCC)   09/20/2020 - 10/25/2020 Chemotherapy   She received cisplatin chemo with concurrent radiation       09/22/2020 - 12/08/2020 Radiation Therapy   Radiation Treatment Dates: 09/22/2020 through 12/08/2020   Site: Pelvis Technique: IMRT Total Dose (Gy): 45/45 Dose per Fx (Gy): 1.8 Completed Fx: 25/25 Beam Energies: 6x   Site: pelvic boost Technique: IMRT Total Dose (Gy): 7.2/7.2 Dose per Fx (Gy): 1.8 Completed Fx: 4/4 Beam Energies: 6x   Site: Cervix boost Technique: HDR-brachytherapy Total Dose (Gy): 27.5/27.5 Dose per Fx (Gy): 5.5 Completed Fx: 5/5 Beam Energies: Ir-192   09/08/2022 -  Chemotherapy   Patient is on Treatment Plan : OVARIAN Carboplatin + Paclitaxel + Bevacizumab q21d        PHYSICAL EXAMINATION: ECOG PERFORMANCE STATUS: 1 - Symptomatic but completely ambulatory  Vitals:   07/24/23 1236  BP: (!) 142/73  Pulse: (!) 57  Resp: 18  Temp: 98.5 F (36.9 C)  SpO2: 97%   Filed Weights   07/24/23 1236  Weight: 192 lb 9.6 oz (87.4 kg)    GENERAL:alert, no distress and comfortable  LABORATORY DATA:  I have reviewed the data as listed    Component Value Date/Time   NA 139 07/16/2023 0339   K 3.2 (L) 07/16/2023 0339    CL 106 07/16/2023 0339   CO2 26 07/16/2023 0339  GLUCOSE 97 07/16/2023 0339   BUN 5 (L) 07/16/2023 0339   CREATININE 1.19 (H) 07/16/2023 0339   CREATININE 1.18 (H) 07/03/2023 1030   CALCIUM 7.5 (L) 07/16/2023 0339   PROT 5.6 (L) 07/13/2023 1822   ALBUMIN 2.5 (L) 07/13/2023 1822   AST 28 07/13/2023 1822   AST 18 07/03/2023 1030   ALT 12 07/13/2023 1822   ALT 9 07/03/2023 1030   ALKPHOS 63 07/13/2023 1822   BILITOT 0.6 07/13/2023 1822   BILITOT 0.8 07/03/2023 1030   GFRNONAA 49 (L) 07/16/2023 0339   GFRNONAA 49 (L) 07/03/2023 1030    No results found for: "SPEP", "UPEP"  Lab Results  Component Value Date   WBC 6.0 07/24/2023   NEUTROABS 3.4 07/24/2023   HGB 10.1 (L) 07/24/2023   HCT 31.9 (L) 07/24/2023   MCV 102.2 (H) 07/24/2023   PLT 284 07/24/2023      Chemistry      Component Value Date/Time   NA 139 07/16/2023 0339   K 3.2 (L) 07/16/2023 0339   CL 106 07/16/2023 0339   CO2 26 07/16/2023 0339   BUN 5 (L) 07/16/2023 0339   CREATININE 1.19 (H) 07/16/2023 0339   CREATININE 1.18 (H) 07/03/2023 1030      Component Value Date/Time   CALCIUM 7.5 (L) 07/16/2023 0339   ALKPHOS 63 07/13/2023 1822   AST 28 07/13/2023 1822   AST 18 07/03/2023 1030   ALT 12 07/13/2023 1822   ALT 9 07/03/2023 1030   BILITOT 0.6 07/13/2023 1822   BILITOT 0.8 07/03/2023 1030       RADIOGRAPHIC STUDIES: I have personally reviewed the radiological images as listed and agreed with the findings in the report. DG Abd 2 Views  Result Date: 07/13/2023 CLINICAL DATA:  Vomiting. EXAM: ABDOMEN - 2 VIEW COMPARISON:  None Available. FINDINGS: No bowel dilatation or evidence of obstruction. No free air. Radiopaque calculi over the right kidney measure up to 6 mm over the lower pole. The soft tissues are unremarkable. No acute osseous pathology. IMPRESSION: 1. Nonobstructive bowel gas pattern. 2. Right renal calculi. Electronically Signed   By: Elgie Collard M.D.   On: 07/13/2023 23:13   CT  ABDOMEN PELVIS W CONTRAST  Result Date: 07/06/2023 CLINICAL DATA:  Left lower quadrant pain. Fever, nausea, vomiting and diarrhea. History of cervical cancer with lung metastases. EXAM: CT ABDOMEN AND PELVIS WITH CONTRAST TECHNIQUE: Multidetector CT imaging of the abdomen and pelvis was performed using the standard protocol following bolus administration of intravenous contrast. RADIATION DOSE REDUCTION: This exam was performed according to the departmental dose-optimization program which includes automated exposure control, adjustment of the mA and/or kV according to patient size and/or use of iterative reconstruction technique. CONTRAST:  OMNIPAQUE IOHEXOL 300 MG/ML  SOLN COMPARISON:  CT 05/17/2023 and older. FINDINGS: Lower chest: Lung bases are grossly clear. No pleural effusion. Trace pericardial fluid. Hepatobiliary: Fatty liver infiltration. No space-occupying liver lesion. Hepatic granulomas. Patent portal vein. Gallbladder is distended. Pancreas: Unremarkable. No pancreatic ductal dilatation or surrounding inflammatory changes. Spleen: Splenic granulomas are identified. Small splenule anteriorly and in the hilum. Adrenals/Urinary Tract: Adrenal glands are preserved. No left-sided enhancing renal mass or collecting system dilatation. The left ureter has a normal course and caliber down to the bladder. The bladder is contracted with some wall thickening and stranding. Please correlate for any clinical evidence of cystitis. Bifid left renal collecting system. The previous 7 mm stone in the right renal pelvis is now in the lower pole  of the right kidney. Second nonobstructing upper pole stone is stable. However there is new moderate right renal collecting system dilatation with increasing perinephric stranding. There is some urothelial thickening as well along the course of the right renal pelvis and ureter. The ureter does slowly taper to a more normal caliber towards the UVJ. No distal ureteral  stone. Please correlate for any signs of urothelial infection. Stomach/Bowel: Stomach is relatively decompressed. The small bowel is nondilated. Large bowel has a normal course and caliber with some scattered colonic diverticula particularly of the sigmoid colon. There is wall thickening seen in the area of the sigmoid colon with the adjacent stranding and thickening of the mesorectal fascia. Vascular/Lymphatic: Aortic atherosclerosis. No enlarged abdominal or pelvic lymph nodes. Reproductive: Uterus is present. No adnexal mass. Increasing soft tissue stranding. Other: No free air.  No developing ascites. Musculoskeletal: Scattered degenerative changes of the spine and pelvis. Stable compression of the inferior endplate of L5 with sclerosis. IMPRESSION: Interval development of ectasia of the right renal collecting system with urothelial thickening and stranding. There are some nonobstructing intrarenal stones but no ureteral stones. Please correlate for any known history including for a urothelial infection. The urinary bladder is also thickened and there is some adjacent stranding. Please correlate for any suggestion a developing stricture. Overall with appearance would recommend follow up to confirm resolution and exclude secondary pathology. Increasing wall thickening along the rectum with perirectal space stranding and thickening of the mesorectal fascia. Please correlate for known history of radiation. 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 Electronically Signed   By: Karen Kays M.D.   On: 07/06/2023 14:43   DG Chest Port 1 View  Result Date: 07/06/2023 CLINICAL DATA:  Questionable sepsis - evaluate for abnormality. Abdominal pain. EXAM: PORTABLE CHEST 1 VIEW COMPARISON:  08/24/2022. FINDINGS: Bilateral lung fields are clear. Again seen is elevated right hemidiaphragm. Bilateral costophrenic angles are clear. Normal cardio-mediastinal silhouette. No acute osseous abnormalities. The soft tissues are  within normal limits. Right-sided CT Port-A-Cath is seen with its tip overlying the upper portion of superior vena cava. IMPRESSION: *No active disease. Electronically Signed   By: Jules Schick M.D.   On: 07/06/2023 14:15

## 2023-07-24 NOTE — Assessment & Plan Note (Signed)
Her blood count is recovering well We will continue treatment as scheduled

## 2023-07-24 NOTE — Assessment & Plan Note (Signed)
Her blood pressure control has improved She has undetectable urine protein We will continue her current blood pressure medication and I will hold off bevacizumab until next month

## 2023-07-24 NOTE — Patient Instructions (Signed)
 [facility] - A DEPT OF [facility]  Discharge Instructions: Thank you for choosing [facility] to provide your oncology and hematology care.   If you have a lab appointment with the Cancer Center, please go directly to the Cancer Center and check in at the registration area.   Wear comfortable clothing and clothing appropriate for easy access to any Portacath or PICC line.   We strive to give you quality time with your provider. You may need to reschedule your appointment if you arrive late (15 or more minutes).  Arriving late affects you and other patients whose appointments are after yours.  Also, if you miss three or more appointments without notifying the office, you may be dismissed from the clinic at the provider's discretion.      For prescription refill requests, have your pharmacy contact our office and allow 72 hours for refills to be completed.    Today you received the following chemotherapy and/or immunotherapy agents Rande Lawman      To help prevent nausea and vomiting after your treatment, we encourage you to take your nausea medication as directed.  BELOW ARE SYMPTOMS THAT SHOULD BE REPORTED IMMEDIATELY: *FEVER GREATER THAN 100.4 F (38 C) OR HIGHER *CHILLS OR SWEATING *NAUSEA AND VOMITING THAT IS NOT CONTROLLED WITH YOUR NAUSEA MEDICATION *UNUSUAL SHORTNESS OF BREATH *UNUSUAL BRUISING OR BLEEDING *URINARY PROBLEMS (pain or burning when urinating, or frequent urination) *BOWEL PROBLEMS (unusual diarrhea, constipation, pain near the anus) TENDERNESS IN MOUTH AND THROAT WITH OR WITHOUT PRESENCE OF ULCERS (sore throat, sores in mouth, or a toothache) UNUSUAL RASH, SWELLING OR PAIN  UNUSUAL VAGINAL DISCHARGE OR ITCHING   Items with * indicate a potential emergency and should be followed up as soon as possible or go to the Emergency Department if any problems should occur.  Please show the CHEMOTHERAPY ALERT CARD or IMMUNOTHERAPY  ALERT CARD at check-in to the Emergency Department and triage nurse.  Should you have questions after your visit or need to cancel or reschedule your appointment, please contact [facility] - A DEPT OF [facility]  Dept: [phone]  and follow the prompts.  Office hours are 8:00 a.m. to 4:30 p.m. Monday - Friday. Please note that voicemails left after 4:00 p.m. may not be returned until the following business day.  We are closed weekends and major holidays. You have access to a nurse at all times for urgent questions. Please call the main number to the clinic Dept: [phone] and follow the prompts.   For any non-urgent questions, you may also contact your provider using MyChart. We now offer e-Visits for anyone 42 and older to request care online for non-urgent symptoms. For details visit [email or web address].   Also download the MyChart app! Go to the app store, search "MyChart", open the app, select [place], and log in with your MyChart username and password.

## 2023-07-25 LAB — T4: T4, Total: 9 ug/dL (ref 4.5–12.0)

## 2023-08-14 ENCOUNTER — Inpatient Hospital Stay: Payer: Medicare PPO

## 2023-08-14 ENCOUNTER — Encounter: Payer: Self-pay | Admitting: Hematology and Oncology

## 2023-08-14 ENCOUNTER — Other Ambulatory Visit: Payer: Self-pay | Admitting: Hematology and Oncology

## 2023-08-14 ENCOUNTER — Inpatient Hospital Stay: Payer: Medicare PPO | Attending: Radiation Oncology | Admitting: Hematology and Oncology

## 2023-08-14 VITALS — BP 152/64 | HR 61 | Resp 18 | Ht 62.0 in | Wt 186.6 lb

## 2023-08-14 DIAGNOSIS — C7800 Secondary malignant neoplasm of unspecified lung: Secondary | ICD-10-CM | POA: Diagnosis not present

## 2023-08-14 DIAGNOSIS — C539 Malignant neoplasm of cervix uteri, unspecified: Secondary | ICD-10-CM | POA: Diagnosis not present

## 2023-08-14 DIAGNOSIS — I10 Essential (primary) hypertension: Secondary | ICD-10-CM | POA: Diagnosis not present

## 2023-08-14 DIAGNOSIS — Z5112 Encounter for antineoplastic immunotherapy: Secondary | ICD-10-CM | POA: Diagnosis not present

## 2023-08-14 DIAGNOSIS — D61818 Other pancytopenia: Secondary | ICD-10-CM | POA: Insufficient documentation

## 2023-08-14 DIAGNOSIS — C538 Malignant neoplasm of overlapping sites of cervix uteri: Secondary | ICD-10-CM

## 2023-08-14 DIAGNOSIS — Z79899 Other long term (current) drug therapy: Secondary | ICD-10-CM | POA: Insufficient documentation

## 2023-08-14 LAB — CBC WITH DIFFERENTIAL (CANCER CENTER ONLY)
Abs Immature Granulocytes: 0.02 10*3/uL (ref 0.00–0.07)
Basophils Absolute: 0 10*3/uL (ref 0.0–0.1)
Basophils Relative: 0 %
Eosinophils Absolute: 0.2 10*3/uL (ref 0.0–0.5)
Eosinophils Relative: 4 %
HCT: 34.1 % — ABNORMAL LOW (ref 36.0–46.0)
Hemoglobin: 11.3 g/dL — ABNORMAL LOW (ref 12.0–15.0)
Immature Granulocytes: 0 %
Lymphocytes Relative: 31 %
Lymphs Abs: 1.5 10*3/uL (ref 0.7–4.0)
MCH: 32.7 pg (ref 26.0–34.0)
MCHC: 33.1 g/dL (ref 30.0–36.0)
MCV: 98.6 fL (ref 80.0–100.0)
Monocytes Absolute: 0.4 10*3/uL (ref 0.1–1.0)
Monocytes Relative: 9 %
Neutro Abs: 2.6 10*3/uL (ref 1.7–7.7)
Neutrophils Relative %: 56 %
Platelet Count: 218 10*3/uL (ref 150–400)
RBC: 3.46 MIL/uL — ABNORMAL LOW (ref 3.87–5.11)
RDW: 15.3 % (ref 11.5–15.5)
WBC Count: 4.7 10*3/uL (ref 4.0–10.5)
nRBC: 0 % (ref 0.0–0.2)

## 2023-08-14 LAB — CMP (CANCER CENTER ONLY)
ALT: 17 U/L (ref 0–44)
AST: 28 U/L (ref 15–41)
Albumin: 3.9 g/dL (ref 3.5–5.0)
Alkaline Phosphatase: 53 U/L (ref 38–126)
Anion gap: 6 (ref 5–15)
BUN: 13 mg/dL (ref 8–23)
CO2: 25 mmol/L (ref 22–32)
Calcium: 10.1 mg/dL (ref 8.9–10.3)
Chloride: 106 mmol/L (ref 98–111)
Creatinine: 1.07 mg/dL — ABNORMAL HIGH (ref 0.44–1.00)
GFR, Estimated: 56 mL/min — ABNORMAL LOW (ref >60)
Glucose, Bld: 76 mg/dL (ref 70–99)
Potassium: 4 mmol/L (ref 3.5–5.1)
Sodium: 137 mmol/L (ref 135–145)
Total Bilirubin: 0.6 mg/dL (ref <1.2)
Total Protein: 7.2 g/dL (ref 6.5–8.1)

## 2023-08-14 LAB — TOTAL PROTEIN, URINE DIPSTICK: Protein, ur: 100 mg/dL — AB

## 2023-08-14 LAB — TSH: TSH: 5.891 u[IU]/mL — ABNORMAL HIGH (ref 0.350–4.500)

## 2023-08-14 MED ORDER — SODIUM CHLORIDE 0.9 % IV SOLN: Freq: Once | INTRAVENOUS | Status: AC

## 2023-08-14 MED ORDER — SODIUM CHLORIDE 0.9 % IV SOLN
200.0000 mg | Freq: Once | INTRAVENOUS | Status: AC
  Administered 2023-08-14: 200 mg via INTRAVENOUS
  Filled 2023-08-14: qty 200

## 2023-08-14 MED ORDER — SODIUM CHLORIDE 0.9% FLUSH
10.0000 mL | Freq: Once | INTRAVENOUS | Status: AC
  Administered 2023-08-14: 10 mL

## 2023-08-14 NOTE — Assessment & Plan Note (Signed)
Overall, she is improving However, urinalysis show moderate heavy proteinuria I recommend omission of bevacizumab She will continue risk factor modification I will see her again in a few weeks for further follow-up She will continue immunotherapy only today

## 2023-08-14 NOTE — Progress Notes (Signed)
[facility] OFFICE PROGRESS NOTE  Patient Care Team: Marletta Lor, NP as PCP - General (Nurse Practitioner)  ASSESSMENT & PLAN:  Cervical cancer (HCC) Overall, she is improving However, urinalysis show moderate heavy proteinuria I recommend omission of bevacizumab She will continue risk factor modification I will see her again in a few weeks for further follow-up She will continue immunotherapy only today  Pancytopenia, acquired (HCC) Her blood count is recovering well We will continue treatment as scheduled  Essential hypertension Blood pressure is borderline high but due to proteinuria, we held bevacizumab  Orders Placed This Encounter  Procedures   Total Protein, Urine dipstick    Standing Status:   Future    Standing Expiration Date:   09/12/2024   TSH    Standing Status:   Future    Standing Expiration Date:   09/12/2024   T4    Standing Status:   Future    Standing Expiration Date:   09/12/2024   CBC with Differential (Cancer Center Only)    Standing Status:   Future    Standing Expiration Date:   09/12/2024   CMP (Cancer Center only)    Standing Status:   Future    Standing Expiration Date:   09/12/2024   Total Protein, Urine dipstick    Standing Status:   Future    Standing Expiration Date:   10/03/2024   TSH    Standing Status:   Future    Standing Expiration Date:   10/03/2024   T4    Standing Status:   Future    Standing Expiration Date:   10/03/2024   CBC with Differential (Cancer Center Only)    Standing Status:   Future    Standing Expiration Date:   10/03/2024   CMP (Cancer Center only)    Standing Status:   Future    Standing Expiration Date:   10/03/2024    All questions were answered. The patient knows to call the clinic with any problems, questions or concerns. The total time spent in the appointment was 30 minutes encounter with patients including review of chart and various tests results, discussions about plan of care and coordination of  care plan   Artis Delay, MD 08/14/2023 4:25 PM  INTERVAL HISTORY: Please see below for problem oriented charting. she returns for treatment and follow-up She is doing well She have lost some weight She denies abdominal pain or changes in bowel habits According to the patient, her blood pressure control at home is excellent but her blood pressure appears elevated today   REVIEW OF SYSTEMS:   Constitutional: Denies fevers, chills or abnormal weight loss Eyes: Denies blurriness of vision Ears, nose, mouth, throat, and face: Denies mucositis or sore throat Respiratory: Denies cough, dyspnea or wheezes Cardiovascular: Denies palpitation, chest discomfort or lower extremity swelling Gastrointestinal:  Denies nausea, heartburn or change in bowel habits Skin: Denies abnormal skin rashes Lymphatics: Denies new lymphadenopathy or easy bruising Neurological:Denies numbness, tingling or new weaknesses Behavioral/Psych: Mood is stable, no new changes  All other systems were reviewed with the patient and are negative.  I have reviewed the past medical history, past surgical history, social history and family history with the patient and they are unchanged from previous note.  ALLERGIES:  is allergic to lisinopril.  MEDICATIONS:  Current Outpatient Medications  Medication Sig Dispense Refill   antiseptic oral rinse (BIOTENE) LIQD 15 mLs by Mouth Rinse route as needed for dry mouth. 120 mL 0  atenolol (TENORMIN) 100 MG tablet Take 1 tablet (100 mg total) by mouth daily. 90 tablet 0   cholecalciferol (VITAMIN D3) 25 MCG (1000 UNIT) tablet Take 2,000 Units by mouth daily.     diphenhydrAMINE (BENADRYL ALLERGY) 25 MG tablet Take 2 tablets (50 mg total) by mouth every 6 (six) hours as needed for allergies. 30 tablet 0   ezetimibe (ZETIA) 10 MG tablet Take 10 mg by mouth in the morning.     glimepiride (AMARYL) 1 MG tablet Take 1 mg by mouth daily with breakfast.     lidocaine-prilocaine (EMLA)  cream Apply 1 Application topically as needed (for port access). 30 g 3   ondansetron (ZOFRAN) 8 MG tablet Take 1 tablet (8 mg total) by mouth every 8 (eight) hours as needed for nausea or vomiting. Start on the third day after chemotherapy. 30 tablet 1   prochlorperazine (COMPAZINE) 10 MG tablet Take 1 tablet (10 mg total) by mouth every 6 (six) hours as needed for nausea or vomiting. 30 tablet 1   rosuvastatin (CRESTOR) 40 MG tablet Take 40 mg by mouth at bedtime.     sitaGLIPtin (JANUVIA) 100 MG tablet Take 50 mg by mouth in the morning.     traMADol (ULTRAM) 50 MG tablet Take 2 tablets (100 mg total) by mouth every 6 (six) hours as needed. (Patient taking differently: Take 100 mg by mouth every 6 (six) hours as needed for moderate pain (pain score 4-6).) 90 tablet 1   TYLENOL 500 MG tablet Take 500-1,000 mg by mouth every 6 (six) hours as needed for mild pain or headache.     vitamin B-12 (CYANOCOBALAMIN) 1000 MCG tablet Take 1,000 mcg by mouth in the morning. (Patient not taking: Reported on 07/13/2023)     No current facility-administered medications for this visit.    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   08/27/2020 Imaging   MRI pelvis Large cervical mass with pelvic adenopathy, at least T2B N1 disease. Question of involvement of posterior urinary bladder (T4) for which additional images have been requested. Patient will return for additional images to answer this question. (Thinner section axial T2 without fat sat and sagittal T2 weighted imaging also without fat saturation.)   Small field-of-view images show that the parametrial extension comes in very close proximity to the RIGHT ureter.   08/30/2020 Initial  Diagnosis   Cervical cancer (HCC)   08/30/2020 Cancer Staging   Staging form: Cervix Uteri, AJCC Version 9 - Clinical stage from 08/30/2020: FIGO Stage IVB (rcT2, cN1, pM1) - Signed by Artis Delay, MD on 09/01/2022 Stage prefix: Recurrence   09/01/2020 PET scan   1. Large cervical mass extending into the endometrial canal to the level of the uterine fundus, associated with pelvic adenopathy to the level of the RIGHT common iliac chain. Please refer to MRI for further detail. 2. No signs of solid organ uptake or metastatic disease to the chest. 3. Subcutaneous nodule with marked increased metabolic activity, correlation with direct clinical inspection is suggested to exclude cutaneous neoplasm or subcutaneous nodule in this location. Complicated sebaceous cyst could also potentially have this appearance. 4. Uptake in the anal canal could likely physiologic. Given the intense nature of the uptake would suggest correlation with direct clinical inspection/examination. 5. Uptake  in axillary lymph nodes with activity less than mediastinal blood pool likely small reactive lymph nodes, attention on follow-up. Morphologic features also with benign appearance.   09/16/2020 Procedure   Successful placement of a power injectable Port-A-Cath via the right internal jugular vein. The catheter is ready for immediate use.     09/20/2020 - 10/25/2020 Chemotherapy   She received cisplatin chemo with concurrent radiation       09/22/2020 - 12/08/2020 Radiation Therapy   Radiation Treatment Dates: 09/22/2020 through 12/08/2020   Site: Pelvis Technique: IMRT Total Dose (Gy): 45/45 Dose per Fx (Gy): 1.8 Completed Fx: 25/25 Beam Energies: 6x   Site: pelvic boost Technique: IMRT Total Dose (Gy): 7.2/7.2 Dose per Fx (Gy): 1.8 Completed Fx: 4/4 Beam Energies: 6x   Site: Cervix boost Technique: HDR-brachytherapy Total Dose (Gy): 27.5/27.5 Dose per Fx (Gy): 5.5 Completed Fx: 5/5 Beam Energies: Ir-192      03/17/2021 PET scan   1. Mark positive response to therapy. 2. Resolution of metabolic activity within the uterine body. 3. Resolution of size and metabolic activity of pelvic lymph nodes. 4. No evidence of new disease outside of the pelvis.   08/10/2022 PET scan   1. New hypermetabolic bilateral pulmonary nodules and hypermetabolic cervical and hilar lymph nodes, consistent with metastatic disease. 2. Hypermetabolic focus in the anterior right third costochondral junction without discrete CT correlate is also compatible with metastatic disease. 3. Prior hysterectomy without suspicious hypermetabolic nodularity along the vaginal cuff. 4.  Aortic Atherosclerosis (ICD10-I70.0).   08/24/2022 Pathology Results   A. SOFT TISSUE NODULE, RIGHT CHEST WALL, NEEDLE CORE BIOPSY:  Poorly differentiated carcinoma.  See comment.   COMMENT:  The carcinoma is characterized by nest of malignant epithelioid cells which focally has features suggestive of squamous differentiation morphologically.  Immunohistochemistry is positive with cytokeratin 7, PAX8, MOC-31 and estrogen receptor.  There is patchy positivity with p16, progesterone receptor and TTF-1.  The tumor is negative with p40, cytokeratin 5/6, p63, CDX2, cytokeratin 20 and WT-1.  The immunophenotype is consistent with adenocarcinoma and suggestive of a gynecologic primary.    09/08/2022 -  Chemotherapy   Patient is on Treatment Plan : OVARIAN Carboplatin + Paclitaxel + Bevacizumab q21d      11/07/2022 Imaging   1. Decreased size of the pulmonary nodules and mediastinal adenopathy, consistent with treatment response. 2. Similar appearance of the previously hypermetabolic soft tissue subjacent to the right anterior third costochondral cartilage. 3. No convincing evidence of new or progressive disease in the chest, abdomen or pelvis. 4. Rectal wall thickening with stranding in the mesorectal fat and diffuse thickening of the urinary bladder wall,  nonspecific but possibly reflecting post radiation change. 5. Nonobstructive right renal stones measure up to 6 mm in the renal pelvis. 6. Colonic diverticulosis without findings of acute diverticulitis. 7.  Aortic Atherosclerosis (ICD10-I70.0).   02/26/2023 Imaging   CT CHEST ABDOMEN PELVIS W CONTRAST  Result Date: 02/24/2023 CLINICAL DATA:  Cervical cancer, assess treatment response * Tracking Code: BO * EXAM: CT CHEST, ABDOMEN, AND PELVIS WITH CONTRAST TECHNIQUE: Multidetector CT imaging of the chest, abdomen and pelvis was performed following the standard protocol during bolus administration of intravenous contrast. RADIATION DOSE REDUCTION: This exam was performed according to the departmental dose-optimization program which includes automated exposure control, adjustment of the mA and/or kV according to patient size and/or use of iterative reconstruction technique. CONTRAST:  OMNIPAQUE IOHEXOL 300 MG/ML  SOLN COMPARISON:  CT chest abdomen pelvis, 11/07/2022, PET-CT, 08/10/2022 FINDINGS: CT CHEST  FINDINGS Cardiovascular: Right chest port catheter. Normal heart size. No pericardial effusion. Mediastinum/Nodes: No persistently enlarged mediastinal, hilar, or axillary lymph nodes. Benign calcified left hilar lymph nodes. Small hiatal hernia. Thyroid gland, trachea, and esophagus demonstrate no significant findings. Lungs/Pleura: Near complete resolution of previously seen bilateral pulmonary nodules, only tiny treated residual remaining, for example a 0.5 cm irregular nodule of the peripheral right apex (series 4, image 28) and a linear residua in the medial left upper lobe (series 4, image 42) benign calcified nodule of the anterior left upper lobe, for which no further follow-up or characterization is required (series 4, image 46). Scarring and volume loss of the left lung base (series 4, image 105). No pleural effusion or pneumothorax. Musculoskeletal: No chest wall abnormality. Previously FDG avid  soft tissue nodule in the anterior right third chondral cartilage is not well appreciated by today's CT (series 2, image 24). No acute osseous findings. CT ABDOMEN PELVIS FINDINGS Hepatobiliary: No solid liver abnormality is seen. No gallstones, gallbladder wall thickening, or biliary dilatation. Pancreas: Unremarkable. No pancreatic ductal dilatation or surrounding inflammatory changes. Spleen: Normal in size. Punctuate granulomatous calcifications of the spleen. Adrenals/Urinary Tract: Adrenal glands are unremarkable. Nonobstructive calculi of the superior pole of the right kidney as well as the right renal pelvis, renal pelvic calculus measuring 0.8 cm (series 2, image 66). The left kidney is normal, without renal calculi, solid lesion, or hydronephrosis. Unchanged diffuse bladder wall thickening with adjacent fat stranding (series 6, image 106) Stomach/Bowel: Stomach is within normal limits. Appendix is not clearly visualized and may be surgically absent. No evidence of bowel wall thickening, distention, or inflammatory changes. Moderate burden of stool throughout the colon and rectum. Sigmoid diverticulosis. Unchanged long segment wall thickening involving the distal sigmoid colon and rectum, particularly with circumferential wall thickening and perirectal fat spiculation of the rectum (series 2, image 101) Vascular/Lymphatic: Aortic atherosclerosis. No enlarged abdominal or pelvic lymph nodes. Reproductive: No directly visualized cervical mass. Other: No abdominal wall hernia or abnormality. No ascites. Musculoskeletal: No acute osseous findings. Unchanged sclerotic inferior endplate deformity of L5 (series 6, image 100). IMPRESSION: 1. No directly visualized cervical mass. 2. Near complete resolution of previously seen pulmonary nodules, with only small residua, consistent with continued treatment response of pulmonary metastatic disease. 3. No persistently enlarged mediastinal lymph nodes, consistent with  sustained treatment response of nodal metastatic disease. 4. No appreciable CT correlate of a previously seen FDG avid soft tissue nodule within the anterior right third costochondral cartilage. 5. Unchanged long segment wall thickening involving the distal sigmoid colon and rectum, particularly with circumferential wall thickening and perirectal fat spiculation of the rectum, as well as diffuse bladder wall thickening. 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 Attention on follow-up. 6. Nonobstructive right nephrolithiasis. Aortic Atherosclerosis (ICD10-I70.0). Electronically Signed   By: Jearld Lesch M.D.   On: 02/24/2023 16:31      05/17/2023 Imaging   CT CHEST ABDOMEN PELVIS W CONTRAST  Result Date: 05/20/2023 CLINICAL DATA:  History of cervical cancer, assess treatment response. * Tracking Code: BO * EXAM: CT CHEST, ABDOMEN, AND PELVIS WITH CONTRAST TECHNIQUE: Multidetector CT imaging of the chest, abdomen and pelvis was performed following the standard protocol during bolus administration of intravenous contrast. RADIATION DOSE REDUCTION: This exam was performed according to the departmental dose-optimization program which includes automated exposure control, adjustment of the mA and/or kV according to patient size and/or use of iterative reconstruction technique. CONTRAST:  OMNIPAQUE IOHEXOL 300 MG/ML  SOLN COMPARISON:  Multiple priors including CT February 23, 2023. FINDINGS: CT CHEST FINDINGS Cardiovascular: Accessed right chest Port-A-Cath with tip at the superior cavoatrial junction no central pulmonary embolus on this nondedicated study. Normal size heart. No significant pericardial effusion/thickening. Mediastinum/Nodes: No suspicious thyroid nodule. No pathologically enlarged mediastinal, hilar or axillary lymph nodes small hiatal hernia Lungs/Pleura: Stable pulmonary nodules. For reference the previously indexed right upper lobe pulmonary nodule measures 4 mm  on image 36/6, unchanged. Cluster of new tiny nodules in the right lower lobe measure up to 3 mm on image 85/6. Scattered atelectasis/scarring. Left upper lobe calcified granuloma. No pleural effusion. No pneumothorax Musculoskeletal: No aggressive lytic or blastic lesion of bone. Multilevel degenerative changes spine. CT ABDOMEN PELVIS FINDINGS Hepatobiliary: No suspicious hepatic lesion. Gallbladder is unremarkable. No biliary ductal dilation. Pancreas: No pancreatic ductal dilation or evidence of acute inflammation. Spleen: Calcified splenic granulomata Adrenals/Urinary Tract: Bilateral adrenal glands appear normal. Nonobstructive bilateral renal calculi including a 7 mm calculus in the right renal pelvis. Similar perivesicular stranding besides a nondistended urinary bladder Stomach/Bowel: Stomach is unremarkable for degree of distension. No pathologic dilation of small or large bowel. Colonic diverticulosis. Similar mild wall thickening of the distal sigmoid colon/rectum with adjacent fat stranding. Vascular/Lymphatic: Aortic atherosclerosis. Smooth IVC contours. The portal, splenic and superior mesenteric veins are patent. No pathologically enlarged abdominal or pelvic lymph nodes. Reproductive: No discrete cervical mass. No suspicious adnexal lesion. Other: No significant abdominopelvic free fluid. No discrete peritoneal or omental nodularity Musculoskeletal: No aggressive lytic or blastic lesion of bone. Similar sclerotic endplate deformity at L5. Probable postradiation change in the sacrum. Multilevel degenerative change of the spine. IMPRESSION: 1. Previously indexed and non indexed pulmonary nodules are stable. Cluster of new tiny nodules in the right lower lobe measure up to 3 mm, favored infectious/inflammatory. Suggest attention on short-term interval follow-up chest CT. 2. No convincing evidence of new or progressive metastatic disease in the chest, abdomen or pelvis. 3. Similar mild perivesicular  stranding and mild wall thickening of the distal sigmoid colon/rectum with adjacent fat stranding, most consistent with postradiation proctitis/cystitis. 4. Nonobstructive bilateral renal calculi including a 7 mm calculus in the right renal pelvis. 5.  Aortic Atherosclerosis (ICD10-I70.0). Electronically Signed   By: Maudry Mayhew M.D.   On: 05/20/2023 15:13      07/06/2023 Imaging   DG Abd 2 Views  Result Date: 07/13/2023 CLINICAL DATA:  Vomiting. EXAM: ABDOMEN - 2 VIEW COMPARISON:  None Available. FINDINGS: No bowel dilatation or evidence of obstruction. No free air. Radiopaque calculi over the right kidney measure up to 6 mm over the lower pole. The soft tissues are unremarkable. No acute osseous pathology. IMPRESSION: 1. Nonobstructive bowel gas pattern. 2. Right renal calculi. Electronically Signed   By: Elgie Collard M.D.   On: 07/13/2023 23:13   CT ABDOMEN PELVIS W CONTRAST  Result Date: 07/06/2023 CLINICAL DATA:  Left lower quadrant pain. Fever, nausea, vomiting and diarrhea. History of cervical cancer with lung metastases. EXAM: CT ABDOMEN AND PELVIS WITH CONTRAST TECHNIQUE: Multidetector CT imaging of the abdomen and pelvis was performed using the standard protocol following bolus administration of intravenous contrast. RADIATION DOSE REDUCTION: This exam was performed according to the departmental dose-optimization program which includes automated exposure control, adjustment of the mA and/or kV according to patient size and/or use of iterative reconstruction technique. CONTRAST:  OMNIPAQUE IOHEXOL 300 MG/ML  SOLN COMPARISON:  CT 05/17/2023 and older. FINDINGS: Lower chest: Lung bases are grossly clear. No pleural  effusion. Trace pericardial fluid. Hepatobiliary: Fatty liver infiltration. No space-occupying liver lesion. Hepatic granulomas. Patent portal vein. Gallbladder is distended. Pancreas: Unremarkable. No pancreatic ductal dilatation or surrounding inflammatory changes. Spleen:  Splenic granulomas are identified. Small splenule anteriorly and in the hilum. Adrenals/Urinary Tract: Adrenal glands are preserved. No left-sided enhancing renal mass or collecting system dilatation. The left ureter has a normal course and caliber down to the bladder. The bladder is contracted with some wall thickening and stranding. Please correlate for any clinical evidence of cystitis. Bifid left renal collecting system. The previous 7 mm stone in the right renal pelvis is now in the lower pole of the right kidney. Second nonobstructing upper pole stone is stable. However there is new moderate right renal collecting system dilatation with increasing perinephric stranding. There is some urothelial thickening as well along the course of the right renal pelvis and ureter. The ureter does slowly taper to a more normal caliber towards the UVJ. No distal ureteral stone. Please correlate for any signs of urothelial infection. Stomach/Bowel: Stomach is relatively decompressed. The small bowel is nondilated. Large bowel has a normal course and caliber with some scattered colonic diverticula particularly of the sigmoid colon. There is wall thickening seen in the area of the sigmoid colon with the adjacent stranding and thickening of the mesorectal fascia. Vascular/Lymphatic: Aortic atherosclerosis. No enlarged abdominal or pelvic lymph nodes. Reproductive: Uterus is present. No adnexal mass. Increasing soft tissue stranding. Other: No free air.  No developing ascites. Musculoskeletal: Scattered degenerative changes of the spine and pelvis. Stable compression of the inferior endplate of L5 with sclerosis. IMPRESSION: Interval development of ectasia of the right renal collecting system with urothelial thickening and stranding. There are some nonobstructing intrarenal stones but no ureteral stones. Please correlate for any known history including for a urothelial infection. The urinary bladder is also thickened and there is  some adjacent stranding. Please correlate for any suggestion a developing stricture. Overall with appearance would recommend follow up to confirm resolution and exclude secondary pathology. Increasing wall thickening along the rectum with perirectal space stranding and thickening of the mesorectal fascia. Please correlate for known history of radiation. 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 Electronically Signed   By: Karen Kays M.D.   On: 07/06/2023 14:43   DG Chest Port 1 View  Result Date: 07/06/2023 CLINICAL DATA:  Questionable sepsis - evaluate for abnormality. Abdominal pain. EXAM: PORTABLE CHEST 1 VIEW COMPARISON:  08/24/2022. FINDINGS: Bilateral lung fields are clear. Again seen is elevated right hemidiaphragm. Bilateral costophrenic angles are clear. Normal cardio-mediastinal silhouette. No acute osseous abnormalities. The soft tissues are within normal limits. Right-sided CT Port-A-Cath is seen with its tip overlying the upper portion of superior vena cava. IMPRESSION: *No active disease. Electronically Signed   By: Jules Schick M.D.   On: 07/06/2023 14:15      Malignant neoplasm of cervix (HCC)  09/01/2020 Initial Diagnosis   Malignant neoplasm of cervix (HCC)   09/20/2020 - 10/25/2020 Chemotherapy   She received cisplatin chemo with concurrent radiation       09/22/2020 - 12/08/2020 Radiation Therapy   Radiation Treatment Dates: 09/22/2020 through 12/08/2020   Site: Pelvis Technique: IMRT Total Dose (Gy): 45/45 Dose per Fx (Gy): 1.8 Completed Fx: 25/25 Beam Energies: 6x   Site: pelvic boost Technique: IMRT Total Dose (Gy): 7.2/7.2 Dose per Fx (Gy): 1.8 Completed Fx: 4/4 Beam Energies: 6x   Site: Cervix boost Technique: HDR-brachytherapy Total Dose (Gy): 27.5/27.5 Dose per Fx (Gy): 5.5 Completed Fx:  5/5 Beam Energies: Ir-192   09/08/2022 -  Chemotherapy   Patient is on Treatment Plan : OVARIAN Carboplatin + Paclitaxel + Bevacizumab q21d        PHYSICAL  EXAMINATION: ECOG PERFORMANCE STATUS: 0 - Asymptomatic  Vitals:   08/14/23 1247  BP: (!) 152/64  Pulse: 61  Resp: 18  SpO2: 100%   Filed Weights   08/14/23 1247  Weight: 186 lb 9.6 oz (84.6 kg)    GENERAL:alert, no distress and comfortable  LABORATORY DATA:  I have reviewed the data as listed    Component Value Date/Time   NA 137 08/14/2023 1217   K 4.0 08/14/2023 1217   CL 106 08/14/2023 1217   CO2 25 08/14/2023 1217   GLUCOSE 76 08/14/2023 1217   BUN 13 08/14/2023 1217   CREATININE 1.07 (H) 08/14/2023 1217   CALCIUM 10.1 08/14/2023 1217   PROT 7.2 08/14/2023 1217   ALBUMIN 3.9 08/14/2023 1217   AST 28 08/14/2023 1217   ALT 17 08/14/2023 1217   ALKPHOS 53 08/14/2023 1217   BILITOT 0.6 08/14/2023 1217   GFRNONAA 56 (L) 08/14/2023 1217    No results found for: "SPEP", "UPEP"  Lab Results  Component Value Date   WBC 4.7 08/14/2023   NEUTROABS 2.6 08/14/2023   HGB 11.3 (L) 08/14/2023   HCT 34.1 (L) 08/14/2023   MCV 98.6 08/14/2023   PLT 218 08/14/2023      Chemistry      Component Value Date/Time   NA 137 08/14/2023 1217   K 4.0 08/14/2023 1217   CL 106 08/14/2023 1217   CO2 25 08/14/2023 1217   BUN 13 08/14/2023 1217   CREATININE 1.07 (H) 08/14/2023 1217      Component Value Date/Time   CALCIUM 10.1 08/14/2023 1217   ALKPHOS 53 08/14/2023 1217   AST 28 08/14/2023 1217   ALT 17 08/14/2023 1217   BILITOT 0.6 08/14/2023 1217

## 2023-08-14 NOTE — Assessment & Plan Note (Signed)
 Her blood count is recovering well We will continue treatment as scheduled

## 2023-08-14 NOTE — Assessment & Plan Note (Signed)
Blood pressure is borderline high but due to proteinuria, we held bevacizumab

## 2023-08-15 ENCOUNTER — Other Ambulatory Visit: Payer: Self-pay | Admitting: Hematology and Oncology

## 2023-08-15 ENCOUNTER — Other Ambulatory Visit: Payer: Self-pay

## 2023-08-15 ENCOUNTER — Telehealth: Payer: Self-pay

## 2023-08-15 LAB — T4: T4, Total: 9.7 ug/dL (ref 4.5–12.0)

## 2023-08-15 MED ORDER — LEVOTHYROXINE SODIUM 50 MCG PO TABS: 50.0000 ug | ORAL_TABLET | Freq: Every day | ORAL | 1 refills | Status: DC

## 2023-08-15 NOTE — Telephone Encounter (Signed)
Patient aware of elevated TSH levels and new prescription for synthroid.  Patient verbalized an understanding of the information and expressed appreciation for the call.

## 2023-08-15 NOTE — Telephone Encounter (Signed)
-----   Message from Artis Delay sent at 08/15/2023 12:05 PM EST ----- TSH is high consistently I placed order for synthroid to her local pharmacy Take 30 mins before breakfast every morning A known common side-effects of immunotherapy

## 2023-09-14 ENCOUNTER — Other Ambulatory Visit: Payer: Self-pay

## 2023-09-14 ENCOUNTER — Inpatient Hospital Stay: Payer: Medicare PPO

## 2023-09-14 ENCOUNTER — Inpatient Hospital Stay: Payer: Medicare PPO | Attending: Radiation Oncology | Admitting: Hematology and Oncology

## 2023-09-14 ENCOUNTER — Telehealth: Payer: Self-pay

## 2023-09-14 ENCOUNTER — Encounter: Payer: Self-pay | Admitting: Hematology and Oncology

## 2023-09-14 VITALS — BP 127/46 | HR 60 | Temp 98.4°F | Resp 18 | Ht 62.0 in | Wt 184.0 lb

## 2023-09-14 VITALS — BP 119/50 | HR 68 | Temp 98.9°F | Resp 16

## 2023-09-14 DIAGNOSIS — C539 Malignant neoplasm of cervix uteri, unspecified: Secondary | ICD-10-CM

## 2023-09-14 DIAGNOSIS — Z5112 Encounter for antineoplastic immunotherapy: Secondary | ICD-10-CM | POA: Diagnosis not present

## 2023-09-14 DIAGNOSIS — E538 Deficiency of other specified B group vitamins: Secondary | ICD-10-CM | POA: Diagnosis not present

## 2023-09-14 DIAGNOSIS — E1122 Type 2 diabetes mellitus with diabetic chronic kidney disease: Secondary | ICD-10-CM | POA: Diagnosis not present

## 2023-09-14 DIAGNOSIS — N183 Chronic kidney disease, stage 3 unspecified: Secondary | ICD-10-CM | POA: Diagnosis not present

## 2023-09-14 DIAGNOSIS — I10 Essential (primary) hypertension: Secondary | ICD-10-CM | POA: Diagnosis not present

## 2023-09-14 DIAGNOSIS — R0609 Other forms of dyspnea: Secondary | ICD-10-CM | POA: Insufficient documentation

## 2023-09-14 DIAGNOSIS — Z7984 Long term (current) use of oral hypoglycemic drugs: Secondary | ICD-10-CM | POA: Insufficient documentation

## 2023-09-14 DIAGNOSIS — Z79899 Other long term (current) drug therapy: Secondary | ICD-10-CM | POA: Insufficient documentation

## 2023-09-14 DIAGNOSIS — C7800 Secondary malignant neoplasm of unspecified lung: Secondary | ICD-10-CM | POA: Insufficient documentation

## 2023-09-14 DIAGNOSIS — C538 Malignant neoplasm of overlapping sites of cervix uteri: Secondary | ICD-10-CM | POA: Diagnosis not present

## 2023-09-14 DIAGNOSIS — I129 Hypertensive chronic kidney disease with stage 1 through stage 4 chronic kidney disease, or unspecified chronic kidney disease: Secondary | ICD-10-CM | POA: Insufficient documentation

## 2023-09-14 LAB — CMP (CANCER CENTER ONLY)
ALT: 16 U/L (ref 0–44)
AST: 30 U/L (ref 15–41)
Albumin: 3.5 g/dL (ref 3.5–5.0)
Alkaline Phosphatase: 89 U/L (ref 38–126)
Anion gap: 6 (ref 5–15)
BUN: 20 mg/dL (ref 8–23)
CO2: 23 mmol/L (ref 22–32)
Calcium: 10.1 mg/dL (ref 8.9–10.3)
Chloride: 109 mmol/L (ref 98–111)
Creatinine: 0.88 mg/dL (ref 0.44–1.00)
GFR, Estimated: >60 mL/min (ref >60)
Glucose, Bld: 91 mg/dL (ref 70–99)
Potassium: 3.7 mmol/L (ref 3.5–5.1)
Sodium: 138 mmol/L (ref 135–145)
Total Bilirubin: 0.5 mg/dL (ref 0.0–1.2)
Total Protein: 6.4 g/dL — ABNORMAL LOW (ref 6.5–8.1)

## 2023-09-14 LAB — CBC WITH DIFFERENTIAL (CANCER CENTER ONLY)
Abs Immature Granulocytes: 0.01 10*3/uL (ref 0.00–0.07)
Basophils Absolute: 0 10*3/uL (ref 0.0–0.1)
Basophils Relative: 1 %
Eosinophils Absolute: 0.2 10*3/uL (ref 0.0–0.5)
Eosinophils Relative: 6 %
HCT: 29.9 % — ABNORMAL LOW (ref 36.0–46.0)
Hemoglobin: 10.1 g/dL — ABNORMAL LOW (ref 12.0–15.0)
Immature Granulocytes: 0 %
Lymphocytes Relative: 29 %
Lymphs Abs: 1.1 10*3/uL (ref 0.7–4.0)
MCH: 32.2 pg (ref 26.0–34.0)
MCHC: 33.8 g/dL (ref 30.0–36.0)
MCV: 95.2 fL (ref 80.0–100.0)
Monocytes Absolute: 0.4 10*3/uL (ref 0.1–1.0)
Monocytes Relative: 10 %
Neutro Abs: 2.1 10*3/uL (ref 1.7–7.7)
Neutrophils Relative %: 54 %
Platelet Count: 193 10*3/uL (ref 150–400)
RBC: 3.14 MIL/uL — ABNORMAL LOW (ref 3.87–5.11)
RDW: 14.7 % (ref 11.5–15.5)
WBC Count: 3.9 10*3/uL — ABNORMAL LOW (ref 4.0–10.5)
nRBC: 0 % (ref 0.0–0.2)

## 2023-09-14 LAB — TOTAL PROTEIN, URINE DIPSTICK: Protein, ur: NEGATIVE mg/dL

## 2023-09-14 LAB — TSH: TSH: 2.72 u[IU]/mL (ref 0.350–4.500)

## 2023-09-14 MED ORDER — SODIUM CHLORIDE 0.9% FLUSH
10.0000 mL | INTRAVENOUS | Status: DC | PRN
  Administered 2023-09-14: 10 mL

## 2023-09-14 MED ORDER — SODIUM CHLORIDE 0.9 % IV SOLN
5.0000 mg/kg | Freq: Once | INTRAVENOUS | Status: AC
  Administered 2023-09-14: 400 mg via INTRAVENOUS
  Filled 2023-09-14: qty 16

## 2023-09-14 MED ORDER — HEPARIN SOD (PORK) LOCK FLUSH 100 UNIT/ML IV SOLN
500.0000 [IU] | Freq: Once | INTRAVENOUS | Status: AC | PRN
  Administered 2023-09-14: 500 [IU]

## 2023-09-14 MED ORDER — SODIUM CHLORIDE 0.9 % IV SOLN
Freq: Once | INTRAVENOUS | Status: AC
Start: 2023-09-14 — End: 2023-09-14

## 2023-09-14 MED ORDER — SODIUM CHLORIDE 0.9% FLUSH
10.0000 mL | Freq: Once | INTRAVENOUS | Status: AC
Start: 2023-09-14 — End: 2023-09-14
  Administered 2023-09-14: 10 mL

## 2023-09-14 MED ORDER — SODIUM CHLORIDE 0.9 % IV SOLN
200.0000 mg | Freq: Once | INTRAVENOUS | Status: AC
  Administered 2023-09-14: 200 mg via INTRAVENOUS
  Filled 2023-09-14: qty 200

## 2023-09-14 NOTE — Progress Notes (Signed)
 [facility] OFFICE PROGRESS NOTE  Patient Care Team: Salina Clarity, NP as PCP - General (Nurse Practitioner)  ASSESSMENT & PLAN:  Cervical cancer [facility]) Since our last visit, she has not been feeling well She had some weight loss due to altered taste sensation and poor appetite Her blood pressure has improved although she felt weak She has occasional sensation of shortness of breath which I cannot explain on the basis of her blood work and exam today I recommend CT imaging for further assessment We will proceed with immunotherapy and bevacizumab  as scheduled  Essential hypertension Blood pressure is not better controlled I will resume bevacizumab  at low-dose at 5 mg/kg She will continue her current antihypertensives  Dyspnea on exertion She has dyspnea on exertion Her oxygen level is adequate and she does not have signs or symptoms of infection She is mildly anemic but not to the degree I would expect shortness of breath on exertion As above, I plan to order CT imaging to rule out metastatic disease to her lungs If that is ruled out, I will refer her to cardiologist for further evaluation I suspect there is an element of deconditioning  Orders Placed This Encounter  Procedures   CT CHEST ABDOMEN PELVIS W CONTRAST    Standing Status:   Future    Expected Date:   09/28/2023    Expiration Date:   09/13/2024    Scheduling Instructions:     No oral contrast    If indicated for the ordered procedure, I authorize the administration of contrast media per Radiology protocol:   Yes    Does the patient have a contrast media/X-ray dye allergy?:   No    Preferred imaging location?:   [facility]    If indicated for the ordered procedure, I authorize the administration of oral contrast media per Radiology protocol:   Yes    All questions were answered. The patient knows to call the clinic with any problems, questions or concerns. The total time spent in the appointment  was 40 minutes encounter with patients including review of chart and various tests results, discussions about plan of care and coordination of care plan   Almarie Bedford, MD 09/14/2023 1:56 PM  INTERVAL HISTORY: Please see below for problem oriented charting. she returns for treatment with her husband She has not been feeling well and brought the list with her She had lost some weight due to altered taste sensation and poor appetite She complained of shortness of breath on minimal exertion Denies recent fever or chills No recent abnormal bleeding Her documented blood pressure from home were normal  REVIEW OF SYSTEMS:   Constitutional: Denies fevers, chills  Eyes: Denies blurriness of vision Ears, nose, mouth, throat, and face: Denies mucositis or sore throat Cardiovascular: Denies palpitation, chest discomfort or lower extremity swelling Gastrointestinal:  Denies nausea, heartburn or change in bowel habits Skin: Denies abnormal skin rashes Lymphatics: Denies new lymphadenopathy or easy bruising Behavioral/Psych: Mood is stable, no new changes  All other systems were reviewed with the patient and are negative.  I have reviewed the past medical history, past surgical history, social history and family history with the patient and they are unchanged from previous note.  ALLERGIES:  is allergic to lisinopril .  MEDICATIONS:  Current Outpatient Medications  Medication Sig Dispense Refill   antiseptic oral rinse (BIOTENE) LIQD 15 mLs by Mouth Rinse route as needed for dry mouth. 120 mL 0   atenolol  (TENORMIN ) 100 MG tablet  Take 1 tablet (100 mg total) by mouth daily. 90 tablet 0   cholecalciferol (VITAMIN D3) 25 MCG (1000 UNIT) tablet Take 2,000 Units by mouth daily.     diphenhydrAMINE  (BENADRYL  ALLERGY) 25 MG tablet Take 2 tablets (50 mg total) by mouth every 6 (six) hours as needed for allergies. 30 tablet 0   ezetimibe (ZETIA) 10 MG tablet Take 10 mg by mouth in the morning.      glimepiride (AMARYL) 1 MG tablet Take 1 mg by mouth daily with breakfast.     levothyroxine  (SYNTHROID ) 50 MCG tablet Take 1 tablet (50 mcg total) by mouth daily before breakfast. 30 tablet 1   lidocaine -prilocaine  (EMLA ) cream Apply 1 Application topically as needed (for port access). 30 g 3   ondansetron  (ZOFRAN ) 8 MG tablet Take 1 tablet (8 mg total) by mouth every 8 (eight) hours as needed for nausea or vomiting. Start on the third day after chemotherapy. 30 tablet 1   prochlorperazine  (COMPAZINE ) 10 MG tablet Take 1 tablet (10 mg total) by mouth every 6 (six) hours as needed for nausea or vomiting. 30 tablet 1   rosuvastatin  (CRESTOR ) 40 MG tablet Take 40 mg by mouth at bedtime.     sitaGLIPtin  (JANUVIA ) 100 MG tablet Take 50 mg by mouth in the morning.     traMADol  (ULTRAM ) 50 MG tablet Take 2 tablets (100 mg total) by mouth every 6 (six) hours as needed. (Patient taking differently: Take 100 mg by mouth every 6 (six) hours as needed for moderate pain (pain score 4-6).) 90 tablet 1   TYLENOL  500 MG tablet Take 500-1,000 mg by mouth every 6 (six) hours as needed for mild pain or headache.     vitamin B-12 (CYANOCOBALAMIN ) 1000 MCG tablet Take 1,000 mcg by mouth in the morning. (Patient not taking: Reported on 07/13/2023)     No current facility-administered medications for this visit.   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   08/27/2020 Imaging   MRI pelvis Large cervical mass with pelvic adenopathy, at least T2B N1 disease. Question of involvement of posterior urinary bladder (T4) for which additional images have been requested. Patient will return for additional images to answer this question. (Thinner section axial T2 without fat sat and sagittal T2 weighted imaging also without fat saturation.)   Small field-of-view images show that the parametrial extension comes in very close proximity to the RIGHT ureter.   08/30/2020 Initial Diagnosis   Cervical cancer (HCC)   08/30/2020 Cancer Staging   Staging form: Cervix Uteri, AJCC Version 9 - Clinical stage from 08/30/2020: FIGO Stage IVB (rcT2, cN1, pM1) - Signed by Lonn Hicks, MD on 09/01/2022 Stage prefix: Recurrence   09/01/2020 PET scan   1.  Large cervical mass extending into the endometrial canal to the level of the uterine fundus, associated with pelvic adenopathy to the level of the RIGHT common iliac chain. Please refer to MRI for further detail. 2. No signs of solid organ uptake or metastatic disease to the chest. 3. Subcutaneous nodule with marked increased metabolic activity, correlation with direct clinical inspection is suggested to exclude cutaneous neoplasm or subcutaneous nodule in this location. Complicated sebaceous cyst could also potentially have this appearance. 4. Uptake in the anal canal could likely physiologic. Given the intense nature of the uptake would suggest correlation with direct clinical inspection/examination. 5. Uptake in axillary lymph nodes with activity less than mediastinal blood pool likely small reactive lymph nodes, attention on follow-up. Morphologic features also with benign appearance.   09/16/2020 Procedure   Successful placement of a power injectable  Port-A-Cath via the right internal jugular vein. The catheter is ready for immediate use.     09/20/2020 - 10/25/2020 Chemotherapy   She received cisplatin  chemo with concurrent radiation       09/22/2020 - 12/08/2020 Radiation Therapy   Radiation Treatment Dates: 09/22/2020 through 12/08/2020   Site: Pelvis Technique: IMRT Total Dose (Gy): 45/45 Dose per Fx (Gy): 1.8 Completed Fx: 25/25 Beam Energies: 6x   Site: pelvic boost Technique: IMRT Total Dose (Gy): 7.2/7.2 Dose per Fx (Gy): 1.8 Completed Fx: 4/4 Beam Energies: 6x   Site: Cervix boost Technique: HDR-brachytherapy Total Dose (Gy): 27.5/27.5 Dose per Fx (Gy): 5.5 Completed Fx: 5/5 Beam Energies: Ir-192     03/17/2021 PET scan   1. Mark positive response to therapy. 2. Resolution of metabolic activity within the uterine body. 3. Resolution of size and metabolic activity of pelvic lymph nodes. 4. No evidence of new disease outside of the pelvis.   08/10/2022 PET scan   1. New hypermetabolic bilateral pulmonary nodules and hypermetabolic cervical and hilar lymph nodes, consistent with metastatic disease. 2. Hypermetabolic focus in the anterior right third costochondral junction without discrete CT correlate is also compatible with metastatic disease. 3. Prior hysterectomy without suspicious hypermetabolic nodularity along the vaginal cuff. 4.  Aortic Atherosclerosis (ICD10-I70.0).   08/24/2022 Pathology Results   A. SOFT TISSUE NODULE, RIGHT CHEST WALL, NEEDLE CORE BIOPSY:  Poorly differentiated carcinoma.  See comment.   COMMENT:  The carcinoma is characterized by nest of malignant epithelioid cells which focally has features suggestive of squamous differentiation morphologically.  Immunohistochemistry is positive with cytokeratin 7, PAX8, MOC-31 and estrogen receptor.  There is patchy positivity with p16, progesterone receptor and TTF-1.  The tumor is negative with p40, cytokeratin 5/6, p63, CDX2, cytokeratin 20 and  WT-1.  The immunophenotype is consistent with adenocarcinoma and suggestive of a gynecologic primary.    09/08/2022 -  Chemotherapy   Patient is on Treatment Plan : OVARIAN Carboplatin  + Paclitaxel  + Bevacizumab  q21d      11/07/2022 Imaging   1. Decreased size of the pulmonary nodules and mediastinal adenopathy, consistent with treatment response. 2. Similar appearance of the previously hypermetabolic soft tissue subjacent to the right anterior third costochondral cartilage. 3. No convincing evidence of new or progressive disease in the chest, abdomen or pelvis. 4. Rectal wall thickening with stranding in the mesorectal fat and diffuse thickening of the urinary bladder wall, nonspecific but possibly reflecting post radiation change. 5. Nonobstructive right renal stones measure up to 6 mm in the renal pelvis. 6. Colonic diverticulosis without findings of acute diverticulitis. 7.  Aortic Atherosclerosis (ICD10-I70.0).   02/26/2023 Imaging   CT  CHEST ABDOMEN PELVIS W CONTRAST  Result Date: 02/24/2023 CLINICAL DATA:  Cervical cancer, assess treatment response * Tracking Code: BO * EXAM: CT CHEST, ABDOMEN, AND PELVIS WITH CONTRAST TECHNIQUE: Multidetector CT imaging of the chest, abdomen and pelvis was performed following the standard protocol during bolus administration of intravenous contrast. RADIATION DOSE REDUCTION: This exam was performed according to the departmental dose-optimization program which includes automated exposure control, adjustment of the mA and/or kV according to patient size and/or use of iterative reconstruction technique. CONTRAST:  OMNIPAQUE  IOHEXOL  300 MG/ML  SOLN COMPARISON:  CT chest abdomen pelvis, 11/07/2022, PET-CT, 08/10/2022 FINDINGS: CT CHEST FINDINGS Cardiovascular: Right chest port catheter. Normal heart size. No pericardial effusion. Mediastinum/Nodes: No persistently enlarged mediastinal, hilar, or axillary lymph nodes. Benign calcified left hilar lymph nodes.  Small hiatal hernia. Thyroid  gland, trachea, and esophagus demonstrate no significant findings. Lungs/Pleura: Near complete resolution of previously seen bilateral pulmonary nodules, only tiny treated residual remaining, for example a 0.5 cm irregular nodule of the peripheral right apex (series 4, image 28) and a linear residua in the medial left upper lobe (series 4, image 42) benign calcified nodule of the anterior left upper lobe, for which no further follow-up or characterization is required (series 4, image 46). Scarring and volume loss of the left lung base (series 4, image 105). No pleural effusion or pneumothorax. Musculoskeletal: No chest wall abnormality. Previously FDG avid soft tissue nodule in the anterior right third chondral cartilage is not well appreciated by today's CT (series 2, image 24). No acute osseous findings. CT ABDOMEN PELVIS FINDINGS Hepatobiliary: No solid liver abnormality is seen. No gallstones, gallbladder wall thickening, or biliary dilatation. Pancreas: Unremarkable. No pancreatic ductal dilatation or surrounding inflammatory changes. Spleen: Normal in size. Punctuate granulomatous calcifications of the spleen. Adrenals/Urinary Tract: Adrenal glands are unremarkable. Nonobstructive calculi of the superior pole of the right kidney as well as the right renal pelvis, renal pelvic calculus measuring 0.8 cm (series 2, image 66). The left kidney is normal, without renal calculi, solid lesion, or hydronephrosis. Unchanged diffuse bladder wall thickening with adjacent fat stranding (series 6, image 106) Stomach/Bowel: Stomach is within normal limits. Appendix is not clearly visualized and may be surgically absent. No evidence of bowel wall thickening, distention, or inflammatory changes. Moderate burden of stool throughout the colon and rectum. Sigmoid diverticulosis. Unchanged long segment wall thickening involving the distal sigmoid colon and rectum, particularly with circumferential wall  thickening and perirectal fat spiculation of the rectum (series 2, image 101) Vascular/Lymphatic: Aortic atherosclerosis. No enlarged abdominal or pelvic lymph nodes. Reproductive: No directly visualized cervical mass. Other: No abdominal wall hernia or abnormality. No ascites. Musculoskeletal: No acute osseous findings. Unchanged sclerotic inferior endplate deformity of L5 (series 6, image 100). IMPRESSION: 1. No directly visualized cervical mass. 2. Near complete resolution of previously seen pulmonary nodules, with only small residua, consistent with continued treatment response of pulmonary metastatic disease. 3. No persistently enlarged mediastinal lymph nodes, consistent with sustained treatment response of nodal metastatic disease. 4. No appreciable CT correlate of a previously seen FDG avid soft tissue nodule within the anterior right third costochondral cartilage. 5. Unchanged long segment wall thickening involving the distal sigmoid colon and rectum, particularly with circumferential wall thickening and perirectal fat spiculation of the rectum, as well as diffuse bladder wall thickening. 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 Attention on follow-up. 6. Nonobstructive right nephrolithiasis. Aortic Atherosclerosis (ICD10-I70.0). Electronically Signed   By: Marolyn JONETTA Jaksch M.D.   On: 02/24/2023  16:31      05/17/2023 Imaging   CT CHEST ABDOMEN PELVIS W CONTRAST  Result Date: 05/20/2023 CLINICAL DATA:  History of cervical cancer, assess treatment response. * Tracking Code: BO * EXAM: CT CHEST, ABDOMEN, AND PELVIS WITH CONTRAST TECHNIQUE: Multidetector CT imaging of the chest, abdomen and pelvis was performed following the standard protocol during bolus administration of intravenous contrast. RADIATION DOSE REDUCTION: This exam was performed according to the departmental dose-optimization program which includes automated exposure control, adjustment of the mA and/or kV  according to patient size and/or use of iterative reconstruction technique. CONTRAST:  OMNIPAQUE  IOHEXOL  300 MG/ML  SOLN COMPARISON:  Multiple priors including CT February 23, 2023. FINDINGS: CT CHEST FINDINGS Cardiovascular: Accessed right chest Port-A-Cath with tip at the superior cavoatrial junction no central pulmonary embolus on this nondedicated study. Normal size heart. No significant pericardial effusion/thickening. Mediastinum/Nodes: No suspicious thyroid  nodule. No pathologically enlarged mediastinal, hilar or axillary lymph nodes small hiatal hernia Lungs/Pleura: Stable pulmonary nodules. For reference the previously indexed right upper lobe pulmonary nodule measures 4 mm on image 36/6, unchanged. Cluster of new tiny nodules in the right lower lobe measure up to 3 mm on image 85/6. Scattered atelectasis/scarring. Left upper lobe calcified granuloma. No pleural effusion. No pneumothorax Musculoskeletal: No aggressive lytic or blastic lesion of bone. Multilevel degenerative changes spine. CT ABDOMEN PELVIS FINDINGS Hepatobiliary: No suspicious hepatic lesion. Gallbladder is unremarkable. No biliary ductal dilation. Pancreas: No pancreatic ductal dilation or evidence of acute inflammation. Spleen: Calcified splenic granulomata Adrenals/Urinary Tract: Bilateral adrenal glands appear normal. Nonobstructive bilateral renal calculi including a 7 mm calculus in the right renal pelvis. Similar perivesicular stranding besides a nondistended urinary bladder Stomach/Bowel: Stomach is unremarkable for degree of distension. No pathologic dilation of small or large bowel. Colonic diverticulosis. Similar mild wall thickening of the distal sigmoid colon/rectum with adjacent fat stranding. Vascular/Lymphatic: Aortic atherosclerosis. Smooth IVC contours. The portal, splenic and superior mesenteric veins are patent. No pathologically enlarged abdominal or pelvic lymph nodes. Reproductive: No discrete cervical mass. No  suspicious adnexal lesion. Other: No significant abdominopelvic free fluid. No discrete peritoneal or omental nodularity Musculoskeletal: No aggressive lytic or blastic lesion of bone. Similar sclerotic endplate deformity at L5. Probable postradiation change in the sacrum. Multilevel degenerative change of the spine. IMPRESSION: 1. Previously indexed and non indexed pulmonary nodules are stable. Cluster of new tiny nodules in the right lower lobe measure up to 3 mm, favored infectious/inflammatory. Suggest attention on short-term interval follow-up chest CT. 2. No convincing evidence of new or progressive metastatic disease in the chest, abdomen or pelvis. 3. Similar mild perivesicular stranding and mild wall thickening of the distal sigmoid colon/rectum with adjacent fat stranding, most consistent with postradiation proctitis/cystitis. 4. Nonobstructive bilateral renal calculi including a 7 mm calculus in the right renal pelvis. 5.  Aortic Atherosclerosis (ICD10-I70.0). Electronically Signed   By: Reyes Holder M.D.   On: 05/20/2023 15:13      07/06/2023 Imaging   DG Abd 2 Views  Result Date: 07/13/2023 CLINICAL DATA:  Vomiting. EXAM: ABDOMEN - 2 VIEW COMPARISON:  None Available. FINDINGS: No bowel dilatation or evidence of obstruction. No free air. Radiopaque calculi over the right kidney measure up to 6 mm over the lower pole. The soft tissues are unremarkable. No acute osseous pathology. IMPRESSION: 1. Nonobstructive bowel gas pattern. 2. Right renal calculi. Electronically Signed   By: Vanetta Chou M.D.   On: 07/13/2023 23:13   CT ABDOMEN PELVIS W CONTRAST  Result Date:  07/06/2023 CLINICAL DATA:  Left lower quadrant pain. Fever, nausea, vomiting and diarrhea. History of cervical cancer with lung metastases. EXAM: CT ABDOMEN AND PELVIS WITH CONTRAST TECHNIQUE: Multidetector CT imaging of the abdomen and pelvis was performed using the standard protocol following bolus administration of intravenous  contrast. RADIATION DOSE REDUCTION: This exam was performed according to the departmental dose-optimization program which includes automated exposure control, adjustment of the mA and/or kV according to patient size and/or use of iterative reconstruction technique. CONTRAST:  OMNIPAQUE  IOHEXOL  300 MG/ML  SOLN COMPARISON:  CT 05/17/2023 and older. FINDINGS: Lower chest: Lung bases are grossly clear. No pleural effusion. Trace pericardial fluid. Hepatobiliary: Fatty liver infiltration. No space-occupying liver lesion. Hepatic granulomas. Patent portal vein. Gallbladder is distended. Pancreas: Unremarkable. No pancreatic ductal dilatation or surrounding inflammatory changes. Spleen: Splenic granulomas are identified. Small splenule anteriorly and in the hilum. Adrenals/Urinary Tract: Adrenal glands are preserved. No left-sided enhancing renal mass or collecting system dilatation. The left ureter has a normal course and caliber down to the bladder. The bladder is contracted with some wall thickening and stranding. Please correlate for any clinical evidence of cystitis. Bifid left renal collecting system. The previous 7 mm stone in the right renal pelvis is now in the lower pole of the right kidney. Second nonobstructing upper pole stone is stable. However there is new moderate right renal collecting system dilatation with increasing perinephric stranding. There is some urothelial thickening as well along the course of the right renal pelvis and ureter. The ureter does slowly taper to a more normal caliber towards the UVJ. No distal ureteral stone. Please correlate for any signs of urothelial infection. Stomach/Bowel: Stomach is relatively decompressed. The small bowel is nondilated. Large bowel has a normal course and caliber with some scattered colonic diverticula particularly of the sigmoid colon. There is wall thickening seen in the area of the sigmoid colon with the adjacent stranding and thickening of the  mesorectal fascia. Vascular/Lymphatic: Aortic atherosclerosis. No enlarged abdominal or pelvic lymph nodes. Reproductive: Uterus is present. No adnexal mass. Increasing soft tissue stranding. Other: No free air.  No developing ascites. Musculoskeletal: Scattered degenerative changes of the spine and pelvis. Stable compression of the inferior endplate of L5 with sclerosis. IMPRESSION: Interval development of ectasia of the right renal collecting system with urothelial thickening and stranding. There are some nonobstructing intrarenal stones but no ureteral stones. Please correlate for any known history including for a urothelial infection. The urinary bladder is also thickened and there is some adjacent stranding. Please correlate for any suggestion a developing stricture. Overall with appearance would recommend follow up to confirm resolution and exclude secondary pathology. Increasing wall thickening along the rectum with perirectal space stranding and thickening of the mesorectal fascia. Please correlate for known history of radiation. 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 Electronically Signed   By: Ranell Bring M.D.   On: 07/06/2023 14:43   DG Chest Port 1 View  Result Date: 07/06/2023 CLINICAL DATA:  Questionable sepsis - evaluate for abnormality. Abdominal pain. EXAM: PORTABLE CHEST 1 VIEW COMPARISON:  08/24/2022. FINDINGS: Bilateral lung fields are clear. Again seen is elevated right hemidiaphragm. Bilateral costophrenic angles are clear. Normal cardio-mediastinal silhouette. No acute osseous abnormalities. The soft tissues are within normal limits. Right-sided CT Port-A-Cath is seen with its tip overlying the upper portion of superior vena cava. IMPRESSION: *No active disease. Electronically Signed   By: Ree Molt M.D.   On: 07/06/2023 14:15      Malignant neoplasm of cervix (  HCC)  09/01/2020 Initial Diagnosis   Malignant neoplasm of cervix (HCC)   09/20/2020 - 10/25/2020 Chemotherapy   She  received cisplatin  chemo with concurrent radiation       09/22/2020 - 12/08/2020 Radiation Therapy   Radiation Treatment Dates: 09/22/2020 through 12/08/2020   Site: Pelvis Technique: IMRT Total Dose (Gy): 45/45 Dose per Fx (Gy): 1.8 Completed Fx: 25/25 Beam Energies: 6x   Site: pelvic boost Technique: IMRT Total Dose (Gy): 7.2/7.2 Dose per Fx (Gy): 1.8 Completed Fx: 4/4 Beam Energies: 6x   Site: Cervix boost Technique: HDR-brachytherapy Total Dose (Gy): 27.5/27.5 Dose per Fx (Gy): 5.5 Completed Fx: 5/5 Beam Energies: Ir-192   09/08/2022 -  Chemotherapy   Patient is on Treatment Plan : OVARIAN Carboplatin  + Paclitaxel  + Bevacizumab  q21d        PHYSICAL EXAMINATION: ECOG PERFORMANCE STATUS: 1 - Symptomatic but completely ambulatory  Vitals:   09/14/23 1123  BP: (!) 127/46  Pulse: 60  Resp: 18  Temp: 98.4 F (36.9 C)  SpO2: 100%   Filed Weights   09/14/23 1123  Weight: 184 lb (83.5 kg)    GENERAL:alert, no distress and comfortable  NEURO: alert & oriented x 3 with fluent speech, no focal motor/sensory deficits  LABORATORY DATA:  I have reviewed the data as listed    Component Value Date/Time   NA 138 09/14/2023 1038   K 3.7 09/14/2023 1038   CL 109 09/14/2023 1038   CO2 23 09/14/2023 1038   GLUCOSE 91 09/14/2023 1038   BUN 20 09/14/2023 1038   CREATININE 0.88 09/14/2023 1038   CALCIUM  10.1 09/14/2023 1038   PROT 6.4 (L) 09/14/2023 1038   ALBUMIN  3.5 09/14/2023 1038   AST 30 09/14/2023 1038   ALT 16 09/14/2023 1038   ALKPHOS 89 09/14/2023 1038   BILITOT 0.5 09/14/2023 1038   GFRNONAA >60 09/14/2023 1038    No results found for: SPEP, UPEP  Lab Results  Component Value Date   WBC 3.9 (L) 09/14/2023   NEUTROABS 2.1 09/14/2023   HGB 10.1 (L) 09/14/2023   HCT 29.9 (L) 09/14/2023   MCV 95.2 09/14/2023   PLT 193 09/14/2023      Chemistry      Component Value Date/Time   NA 138 09/14/2023 1038   K 3.7 09/14/2023 1038   CL 109  09/14/2023 1038   CO2 23 09/14/2023 1038   BUN 20 09/14/2023 1038   CREATININE 0.88 09/14/2023 1038      Component Value Date/Time   CALCIUM  10.1 09/14/2023 1038   ALKPHOS 89 09/14/2023 1038   AST 30 09/14/2023 1038   ALT 16 09/14/2023 1038   BILITOT 0.5 09/14/2023 1038

## 2023-09-14 NOTE — Assessment & Plan Note (Signed)
 She has dyspnea on exertion Her oxygen level is adequate and she does not have signs or symptoms of infection She is mildly anemic but not to the degree I would expect shortness of breath on exertion As above, I plan to order CT imaging to rule out metastatic disease to her lungs If that is ruled out, I will refer her to cardiologist for further evaluation I suspect there is an element of deconditioning

## 2023-09-14 NOTE — Telephone Encounter (Signed)
 Called and given below message. She verbalized understanding.

## 2023-09-14 NOTE — Patient Instructions (Signed)
 [facility] - A DEPT OF [facility]  Discharge Instructions: Thank you for choosing [facility] to provide your oncology and hematology care.   If you have a lab appointment with the Cancer Center, please go directly to the Cancer Center and check in at the registration area.   Wear comfortable clothing and clothing appropriate for easy access to any Portacath or PICC line.   We strive to give you quality time with your provider. You may need to reschedule your appointment if you arrive late (15 or more minutes).  Arriving late affects you and other patients whose appointments are after yours.  Also, if you miss three or more appointments without notifying the office, you may be dismissed from the clinic at the provider's discretion.      For prescription refill requests, have your pharmacy contact our office and allow 72 hours for refills to be completed.    Today you received the following chemotherapy and/or immunotherapy agents: Bevacizumab  (Mvasi ) and Pembrolizumab  (Keytruda )      To help prevent nausea and vomiting after your treatment, we encourage you to take your nausea medication as directed.  BELOW ARE SYMPTOMS THAT SHOULD BE REPORTED IMMEDIATELY: *FEVER GREATER THAN 100.4 F (38 C) OR HIGHER *CHILLS OR SWEATING *NAUSEA AND VOMITING THAT IS NOT CONTROLLED WITH YOUR NAUSEA MEDICATION *UNUSUAL SHORTNESS OF BREATH *UNUSUAL BRUISING OR BLEEDING *URINARY PROBLEMS (pain or burning when urinating, or frequent urination) *BOWEL PROBLEMS (unusual diarrhea, constipation, pain near the anus) TENDERNESS IN MOUTH AND THROAT WITH OR WITHOUT PRESENCE OF ULCERS (sore throat, sores in mouth, or a toothache) UNUSUAL RASH, SWELLING OR PAIN  UNUSUAL VAGINAL DISCHARGE OR ITCHING   Items with * indicate a potential emergency and should be followed up as soon as possible or go to the Emergency Department if any problems should occur.  Please show the  CHEMOTHERAPY ALERT CARD or IMMUNOTHERAPY ALERT CARD at check-in to the Emergency Department and triage nurse.  Should you have questions after your visit or need to cancel or reschedule your appointment, please contact [facility] - A DEPT OF [facility]  Dept: [phone]  and follow the prompts.  Office hours are 8:00 a.m. to 4:30 p.m. Monday - Friday. Please note that voicemails left after 4:00 p.m. may not be returned until the following business day.  We are closed weekends and major holidays. You have access to a nurse at all times for urgent questions. Please call the main number to the clinic Dept: [phone] and follow the prompts.   For any non-urgent questions, you may also contact your provider using MyChart. We now offer e-Visits for anyone 38 and older to request care online for non-urgent symptoms. For details visit [email or web address].   Also download the MyChart app! Go to the app store, search MyChart, open the app, select [facility], and log in with your MyChart username and password.  Bevacizumab  Injection What is this medication? BEVACIZUMAB  (be va SIZ yoo mab) treats some types of cancer. It works by blocking a protein that causes cancer cells to grow and multiply. This helps to slow or stop the spread of cancer cells. It is a monoclonal antibody. This medicine may be used for other purposes; ask your health care provider or pharmacist if you have questions. COMMON BRAND NAME(S): Alymsys , Avastin , MVASI , Zirabev  What should I tell my care team before I take this medication? They need to  know if you have any of these conditions: Blood clots Coughing up blood Having or recent surgery Heart failure High blood pressure History of a connection between 2 or more body parts that do not usually connect (fistula) History of a tear in your stomach or intestines Protein in your urine An unusual or allergic reaction to bevacizumab , other  medications, foods, dyes, or preservatives Pregnant or trying to get pregnant Breast-feeding How should I use this medication? This medication is injected into a vein. It is given by your care team in a hospital or clinic setting. Talk to your care team the use of this medication in children. Special care may be needed. Overdosage: If you think you have taken too much of this medicine contact a poison control center or emergency room at once. NOTE: This medicine is only for you. Do not share this medicine with others. What if I miss a dose? Keep appointments for follow-up doses. It is important not to miss your dose. Call your care team if you are unable to keep an appointment. What may interact with this medication? Interactions are not expected. This list may not describe all possible interactions. Give your health care provider a list of all the medicines, herbs, non-prescription drugs, or dietary supplements you use. Also tell them if you smoke, drink alcohol , or use illegal drugs. Some items may interact with your medicine. What should I watch for while using this medication? Your condition will be monitored carefully while you are receiving this medication. You may need blood work while taking this medication. This medication may make you feel generally unwell. This is not uncommon as chemotherapy can affect healthy cells as well as cancer cells. Report any side effects. Continue your course of treatment even though you feel ill unless your care team tells you to stop. This medication may increase your risk to bruise or bleed. Call your care team if you notice any unusual bleeding. Before having surgery, talk to your care team to make sure it is ok. This medication can increase the risk of poor healing of your surgical site or wound. You will need to stop this medication for 28 days before surgery. After surgery, wait at least 28 days before restarting this medication. Make sure the surgical  site or wound is healed enough before restarting this medication. Talk to your care team if questions. Talk to your care team if you may be pregnant. Serious birth defects can occur if you take this medication during pregnancy and for 6 months after the last dose. Contraception is recommended while taking this medication and for 6 months after the last dose. Your care team can help you find the option that works for you. Do not breastfeed while taking this medication and for 6 months after the last dose. This medication can cause infertility. Talk to your care team if you are concerned about your fertility. What side effects may I notice from receiving this medication? Side effects that you should report to your care team as soon as possible: Allergic reactions--skin rash, itching, hives, swelling of the face, lips, tongue, or throat Bleeding--bloody or black, tar-like stools, vomiting blood or brown material that looks like coffee grounds, red or dark brown urine, small red or purple spots on skin, unusual bruising or bleeding Blood clot--pain, swelling, or warmth in the leg, shortness of breath, chest pain Heart attack--pain or tightness in the chest, shoulders, arms, or jaw, nausea, shortness of breath, cold or clammy skin, feeling faint or  lightheaded Heart failure--shortness of breath, swelling of the ankles, feet, or hands, sudden weight gain, unusual weakness or fatigue Increase in blood pressure Infection--fever, chills, cough, sore throat, wounds that don't heal, pain or trouble when passing urine, general feeling of discomfort or being unwell Infusion reactions--chest pain, shortness of breath or trouble breathing, feeling faint or lightheaded Kidney injury--decrease in the amount of urine, swelling of the ankles, hands, or feet Stomach pain that is severe, does not go away, or gets worse Stroke--sudden numbness or weakness of the face, arm, or leg, trouble speaking, confusion, trouble  walking, loss of balance or coordination, dizziness, severe headache, change in vision Sudden and severe headache, confusion, change in vision, seizures, which may be signs of posterior reversible encephalopathy syndrome (PRES) Side effects that usually do not require medical attention (report to your care team if they continue or are bothersome): Back pain Change in taste Diarrhea Dry skin Increased tears Nosebleed This list may not describe all possible side effects. Call your doctor for medical advice about side effects. You may report side effects to FDA at 1-800-FDA-1088. Where should I keep my medication? This medication is given in a hospital or clinic. It will not be stored at home. NOTE: This sheet is a summary. It may not cover all possible information. If you have questions about this medicine, talk to your doctor, pharmacist, or health care provider.  2024 Elsevier/Gold Standard (2022-01-13 00:00:00) Pembrolizumab  Injection What is this medication? PEMBROLIZUMAB  (PEM broe LIZ ue mab) treats some types of cancer. It works by helping your immune system slow or stop the spread of cancer cells. It is a monoclonal antibody. This medicine may be used for other purposes; ask your health care provider or pharmacist if you have questions. COMMON BRAND NAME(S): Keytruda  What should I tell my care team before I take this medication? They need to know if you have any of these conditions: Allogeneic stem cell transplant (uses someone else's stem cells) Autoimmune diseases, such as Crohn disease, ulcerative colitis, lupus History of chest radiation Nervous system problems, such as Guillain-Barre syndrome, myasthenia gravis Organ transplant An unusual or allergic reaction to pembrolizumab , other medications, foods, dyes, or preservatives Pregnant or trying to get pregnant Breast-feeding How should I use this medication? This medication is injected into a vein. It is given by your care  team in a hospital or clinic setting. A special MedGuide will be given to you before each treatment. Be sure to read this information carefully each time. Talk to your care team about the use of this medication in children. While it may be prescribed for children as young as 6 months for selected conditions, precautions do apply. Overdosage: If you think you have taken too much of this medicine contact a poison control center or emergency room at once. NOTE: This medicine is only for you. Do not share this medicine with others. What if I miss a dose? Keep appointments for follow-up doses. It is important not to miss your dose. Call your care team if you are unable to keep an appointment. What may interact with this medication? Interactions have not been studied. This list may not describe all possible interactions. Give your health care provider a list of all the medicines, herbs, non-prescription drugs, or dietary supplements you use. Also tell them if you smoke, drink alcohol , or use illegal drugs. Some items may interact with your medicine. What should I watch for while using this medication? Your condition will be monitored carefully while you  are receiving this medication. You may need blood work while taking this medication. This medication may cause serious skin reactions. They can happen weeks to months after starting the medication. Contact your care team right away if you notice fevers or flu-like symptoms with a rash. The rash may be red or purple and then turn into blisters or peeling of the skin. You may also notice a red rash with swelling of the face, lips, or lymph nodes in your neck or under your arms. Tell your care team right away if you have any change in your eyesight. Talk to your care team if you may be pregnant. Serious birth defects can occur if you take this medication during pregnancy and for 4 months after the last dose. You will need a negative pregnancy test before  starting this medication. Contraception is recommended while taking this medication and for 4 months after the last dose. Your care team can help you find the option that works for you. Do not breastfeed while taking this medication and for 4 months after the last dose. What side effects may I notice from receiving this medication? Side effects that you should report to your care team as soon as possible: Allergic reactions--skin rash, itching, hives, swelling of the face, lips, tongue, or throat Dry cough, shortness of breath or trouble breathing Eye pain, redness, irritation, or discharge with blurry or decreased vision Heart muscle inflammation--unusual weakness or fatigue, shortness of breath, chest pain, fast or irregular heartbeat, dizziness, swelling of the ankles, feet, or hands Hormone gland problems--headache, sensitivity to light, unusual weakness or fatigue, dizziness, fast or irregular heartbeat, increased sensitivity to cold or heat, excessive sweating, constipation, hair loss, increased thirst or amount of urine, tremors or shaking, irritability Infusion reactions--chest pain, shortness of breath or trouble breathing, feeling faint or lightheaded Kidney injury (glomerulonephritis)--decrease in the amount of urine, red or dark brown urine, foamy or bubbly urine, swelling of the ankles, hands, or feet Liver injury--right upper belly pain, loss of appetite, nausea, light-colored stool, dark yellow or brown urine, yellowing skin or eyes, unusual weakness or fatigue Pain, tingling, or numbness in the hands or feet, muscle weakness, change in vision, confusion or trouble speaking, loss of balance or coordination, trouble walking, seizures Rash, fever, and swollen lymph nodes Redness, blistering, peeling, or loosening of the skin, including inside the mouth Sudden or severe stomach pain, bloody diarrhea, fever, nausea, vomiting Side effects that usually do not require medical attention  (report to your care team if they continue or are bothersome): Bone, joint, or muscle pain Diarrhea Fatigue Loss of appetite Nausea Skin rash This list may not describe all possible side effects. Call your doctor for medical advice about side effects. You may report side effects to FDA at 1-800-FDA-1088. Where should I keep my medication? This medication is given in a hospital or clinic. It will not be stored at home. NOTE: This sheet is a summary. It may not cover all possible information. If you have questions about this medicine, talk to your doctor, pharmacist, or health care provider.  2024 Elsevier/Gold Standard (2022-01-10 00:00:00)

## 2023-09-14 NOTE — Assessment & Plan Note (Signed)
 Blood pressure is not better controlled I will resume bevacizumab at low-dose at 5 mg/kg She will continue her current antihypertensives

## 2023-09-14 NOTE — Assessment & Plan Note (Signed)
 Since our last visit, she has not been feeling well She had some weight loss due to altered taste sensation and poor appetite Her blood pressure has improved although she felt weak She has occasional sensation of shortness of breath which I cannot explain on the basis of her blood work and exam today I recommend CT imaging for further assessment We will proceed with immunotherapy and bevacizumab  as scheduled

## 2023-09-14 NOTE — Telephone Encounter (Signed)
-----   Message from Artis Delay sent at 09/14/2023  2:36 PM EST ----- Let her know TSH is now normal, no changes to her thyroid meds

## 2023-09-15 LAB — T4: T4, Total: 11.8 ug/dL (ref 4.5–12.0)

## 2023-09-18 ENCOUNTER — Telehealth: Payer: Self-pay | Admitting: Oncology

## 2023-09-18 NOTE — Telephone Encounter (Signed)
 Let Misty Deleon know that her port can be accessed and deaccessed in radiology.  She verbalized understanding and agreement.

## 2023-09-24 ENCOUNTER — Other Ambulatory Visit: Payer: Self-pay

## 2023-09-26 ENCOUNTER — Other Ambulatory Visit: Payer: Self-pay

## 2023-09-26 ENCOUNTER — Other Ambulatory Visit: Payer: Self-pay | Admitting: Hematology and Oncology

## 2023-09-26 DIAGNOSIS — C539 Malignant neoplasm of cervix uteri, unspecified: Secondary | ICD-10-CM

## 2023-09-28 ENCOUNTER — Encounter (HOSPITAL_COMMUNITY): Payer: Self-pay

## 2023-09-28 ENCOUNTER — Ambulatory Visit (HOSPITAL_COMMUNITY)
Admission: RE | Admit: 2023-09-28 | Discharge: 2023-09-28 | Disposition: A | Discharge status: Home / Self Care | Payer: Medicare PPO | Source: Ambulatory Visit | Attending: Hematology and Oncology | Admitting: Hematology and Oncology

## 2023-09-28 DIAGNOSIS — C539 Malignant neoplasm of cervix uteri, unspecified: Secondary | ICD-10-CM | POA: Diagnosis not present

## 2023-09-28 DIAGNOSIS — C538 Malignant neoplasm of overlapping sites of cervix uteri: Secondary | ICD-10-CM | POA: Diagnosis not present

## 2023-09-28 DIAGNOSIS — Z90710 Acquired absence of both cervix and uterus: Secondary | ICD-10-CM | POA: Diagnosis not present

## 2023-09-28 DIAGNOSIS — I700 Atherosclerosis of aorta: Secondary | ICD-10-CM | POA: Diagnosis not present

## 2023-09-28 MED ORDER — HEPARIN SOD (PORK) LOCK FLUSH 100 UNIT/ML IV SOLN
INTRAVENOUS | Status: AC
  Filled 2023-09-28: qty 5

## 2023-09-28 MED ORDER — IOHEXOL 300 MG/ML  SOLN
100.0000 mL | Freq: Once | INTRAMUSCULAR | Status: AC | PRN
  Administered 2023-09-28: 100 mL via INTRAVENOUS

## 2023-09-28 MED ORDER — HEPARIN SOD (PORK) LOCK FLUSH 100 UNIT/ML IV SOLN
500.0000 [IU] | Freq: Once | INTRAVENOUS | Status: AC
  Administered 2023-09-28: 500 [IU] via INTRAVENOUS

## 2023-10-04 ENCOUNTER — Inpatient Hospital Stay: Payer: Medicare PPO | Admitting: Hematology and Oncology

## 2023-10-04 ENCOUNTER — Inpatient Hospital Stay: Payer: Medicare PPO

## 2023-10-04 ENCOUNTER — Encounter: Payer: Self-pay | Admitting: Hematology and Oncology

## 2023-10-04 VITALS — BP 132/56 | HR 68 | Resp 20

## 2023-10-04 VITALS — BP 117/63 | HR 59 | Temp 98.2°F | Resp 18 | Ht 62.0 in | Wt 182.2 lb

## 2023-10-04 DIAGNOSIS — Z7984 Long term (current) use of oral hypoglycemic drugs: Secondary | ICD-10-CM | POA: Diagnosis not present

## 2023-10-04 DIAGNOSIS — Z5112 Encounter for antineoplastic immunotherapy: Secondary | ICD-10-CM | POA: Diagnosis not present

## 2023-10-04 DIAGNOSIS — I129 Hypertensive chronic kidney disease with stage 1 through stage 4 chronic kidney disease, or unspecified chronic kidney disease: Secondary | ICD-10-CM | POA: Diagnosis not present

## 2023-10-04 DIAGNOSIS — G8929 Other chronic pain: Secondary | ICD-10-CM | POA: Diagnosis not present

## 2023-10-04 DIAGNOSIS — I10 Essential (primary) hypertension: Secondary | ICD-10-CM | POA: Diagnosis not present

## 2023-10-04 DIAGNOSIS — M545 Low back pain, unspecified: Secondary | ICD-10-CM | POA: Diagnosis not present

## 2023-10-04 DIAGNOSIS — C538 Malignant neoplasm of overlapping sites of cervix uteri: Secondary | ICD-10-CM | POA: Diagnosis not present

## 2023-10-04 DIAGNOSIS — N183 Chronic kidney disease, stage 3 unspecified: Secondary | ICD-10-CM

## 2023-10-04 DIAGNOSIS — E1122 Type 2 diabetes mellitus with diabetic chronic kidney disease: Secondary | ICD-10-CM | POA: Diagnosis not present

## 2023-10-04 DIAGNOSIS — E538 Deficiency of other specified B group vitamins: Secondary | ICD-10-CM | POA: Diagnosis not present

## 2023-10-04 DIAGNOSIS — C539 Malignant neoplasm of cervix uteri, unspecified: Secondary | ICD-10-CM

## 2023-10-04 DIAGNOSIS — R0609 Other forms of dyspnea: Secondary | ICD-10-CM | POA: Diagnosis not present

## 2023-10-04 DIAGNOSIS — E119 Type 2 diabetes mellitus without complications: Secondary | ICD-10-CM | POA: Diagnosis not present

## 2023-10-04 DIAGNOSIS — C7800 Secondary malignant neoplasm of unspecified lung: Secondary | ICD-10-CM | POA: Diagnosis not present

## 2023-10-04 LAB — CMP (CANCER CENTER ONLY)
ALT: 7 U/L (ref 0–44)
AST: 20 U/L (ref 15–41)
Albumin: 3.4 g/dL — ABNORMAL LOW (ref 3.5–5.0)
Alkaline Phosphatase: 73 U/L (ref 38–126)
Anion gap: 6 (ref 5–15)
BUN: 17 mg/dL (ref 8–23)
CO2: 27 mmol/L (ref 22–32)
Calcium: 10.3 mg/dL (ref 8.9–10.3)
Chloride: 102 mmol/L (ref 98–111)
Creatinine: 1.27 mg/dL — ABNORMAL HIGH (ref 0.44–1.00)
GFR, Estimated: 45 mL/min — ABNORMAL LOW (ref >60)
Glucose, Bld: 64 mg/dL — ABNORMAL LOW (ref 70–99)
Potassium: 3.9 mmol/L (ref 3.5–5.1)
Sodium: 135 mmol/L (ref 135–145)
Total Bilirubin: 0.5 mg/dL (ref 0.0–1.2)
Total Protein: 6.2 g/dL — ABNORMAL LOW (ref 6.5–8.1)

## 2023-10-04 LAB — CBC WITH DIFFERENTIAL (CANCER CENTER ONLY)
Abs Immature Granulocytes: 0.01 10*3/uL (ref 0.00–0.07)
Basophils Absolute: 0 10*3/uL (ref 0.0–0.1)
Basophils Relative: 1 %
Eosinophils Absolute: 0.3 10*3/uL (ref 0.0–0.5)
Eosinophils Relative: 6 %
HCT: 31.8 % — ABNORMAL LOW (ref 36.0–46.0)
Hemoglobin: 10.3 g/dL — ABNORMAL LOW (ref 12.0–15.0)
Immature Granulocytes: 0 %
Lymphocytes Relative: 35 %
Lymphs Abs: 1.5 10*3/uL (ref 0.7–4.0)
MCH: 32.6 pg (ref 26.0–34.0)
MCHC: 32.4 g/dL (ref 30.0–36.0)
MCV: 100.6 fL — ABNORMAL HIGH (ref 80.0–100.0)
Monocytes Absolute: 0.6 10*3/uL (ref 0.1–1.0)
Monocytes Relative: 13 %
Neutro Abs: 2 10*3/uL (ref 1.7–7.7)
Neutrophils Relative %: 45 %
Platelet Count: 216 10*3/uL (ref 150–400)
RBC: 3.16 MIL/uL — ABNORMAL LOW (ref 3.87–5.11)
RDW: 15.3 % (ref 11.5–15.5)
WBC Count: 4.3 10*3/uL (ref 4.0–10.5)
nRBC: 0 % (ref 0.0–0.2)

## 2023-10-04 LAB — TOTAL PROTEIN, URINE DIPSTICK: Protein, ur: NEGATIVE mg/dL

## 2023-10-04 LAB — TSH: TSH: 5.103 u[IU]/mL — ABNORMAL HIGH (ref 0.350–4.500)

## 2023-10-04 MED ORDER — SODIUM CHLORIDE 0.9% FLUSH
10.0000 mL | Freq: Once | INTRAVENOUS | Status: AC
  Administered 2023-10-04: 10 mL

## 2023-10-04 MED ORDER — SODIUM CHLORIDE 0.9 % IV SOLN: Freq: Once | INTRAVENOUS | Status: AC

## 2023-10-04 MED ORDER — SODIUM CHLORIDE 0.9 % IV SOLN
200.0000 mg | Freq: Once | INTRAVENOUS | Status: AC
  Administered 2023-10-04: 200 mg via INTRAVENOUS
  Filled 2023-10-04: qty 200

## 2023-10-04 MED ORDER — SODIUM CHLORIDE 0.9% FLUSH
10.0000 mL | INTRAVENOUS | Status: DC | PRN
Start: 2023-10-04 — End: 2023-10-04
  Administered 2023-10-04: 10 mL

## 2023-10-04 MED ORDER — HEPARIN SOD (PORK) LOCK FLUSH 100 UNIT/ML IV SOLN
500.0000 [IU] | Freq: Once | INTRAVENOUS | Status: AC | PRN
  Administered 2023-10-04: 500 [IU]

## 2023-10-04 MED ORDER — SODIUM CHLORIDE 0.9 % IV SOLN
5.0000 mg/kg | Freq: Once | INTRAVENOUS | Status: AC
  Administered 2023-10-04: 400 mg via INTRAVENOUS
  Filled 2023-10-04: qty 16

## 2023-10-04 NOTE — Assessment & Plan Note (Deleted)
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

## 2023-10-04 NOTE — Assessment & Plan Note (Signed)
She have sciatica type pain Imaging studies show no evidence of metastatic disease to the bone She has appointment to see orthopedic surgeon for evaluation

## 2023-10-04 NOTE — Assessment & Plan Note (Signed)
I have reviewed imaging studies with the patient and her husband She have no signs of active disease We will monitor with another imaging study in 4 to 6 months Her recent weight loss is unrelated

## 2023-10-04 NOTE — Assessment & Plan Note (Signed)
She has intermittent elevated serum creatinine She has multiple risk factors including diabetes and hypertension She will continue hydration and risk factor modification

## 2023-10-04 NOTE — Assessment & Plan Note (Signed)
She will continue oral B12 supplement I will recheck a B12 level in her next visit

## 2023-10-04 NOTE — Assessment & Plan Note (Signed)
Blood pressure is low today I recommend close monitoring at home If her systolic blood pressures consistently less than 130, I recommend the patient to reduce her atenolol to half a pill

## 2023-10-04 NOTE — Progress Notes (Signed)
[facility] OFFICE PROGRESS NOTE  Patient Care Team: Marletta Lor, NP as PCP - General (Nurse Practitioner)  ASSESSMENT & PLAN:  Cervical cancer [facility]) I have reviewed imaging studies with the patient and her husband She have no signs of active disease We will monitor with another imaging study in 4 to 6 months Her recent weight loss is unrelated   Vitamin B12 deficiency She will continue oral B12 supplement I will recheck a B12 level in her next visit  Chronic kidney disease (CKD), stage III (moderate) (HCC) She has intermittent elevated serum creatinine She has multiple risk factors including diabetes and hypertension She will continue hydration and risk factor modification  Diabetes mellitus without complication (HCC) She has profound weight loss due to poor appetite I recommend discontinuation of glimepiride  Essential hypertension Blood pressure is low today I recommend close monitoring at home If her systolic blood pressures consistently less than 130, I recommend the patient to reduce her atenolol to half a pill  Low back ache She have sciatica type pain Imaging studies show no evidence of metastatic disease to the bone She has appointment to see orthopedic surgeon for evaluation  Orders Placed This Encounter  Procedures   Total Protein, Urine dipstick    Standing Status:   Future    Expected Date:       Expiration Date:   10/24/2024   TSH    Standing Status:   Future    Expected Date:       Expiration Date:   10/24/2024   T4    Standing Status:   Future    Expected Date:       Expiration Date:   10/24/2024   CBC with Differential (Cancer Center Only)    Standing Status:   Future    Expected Date:       Expiration Date:   10/24/2024   CMP (Cancer Center only)    Standing Status:   Future    Expected Date:       Expiration Date:   10/24/2024   Total Protein, Urine dipstick    Standing Status:    Future    Expected Date:   11/15/2023    Expiration Date:   11/14/2024   TSH    Standing Status:   Future    Expected Date:   11/15/2023    Expiration Date:   11/14/2024   T4    Standing Status:   Future    Expected Date:   11/15/2023    Expiration Date:   11/14/2024   CBC with Differential (Cancer Center Only)    Standing Status:   Future    Expected Date:   11/15/2023    Expiration Date:   11/14/2024   CMP (Cancer Center only)    Standing Status:   Future    Expected Date:   11/15/2023    Expiration Date:   11/14/2024   Total Protein, Urine dipstick    Standing Status:   Future    Expected Date:   12/06/2023    Expiration Date:   12/05/2024   TSH    Standing Status:   Future    Expected Date:   12/06/2023    Expiration Date:   12/05/2024   T4    Standing Status:   Future    Expected Date:   12/06/2023    Expiration Date:   12/05/2024   CBC with Differential (Cancer Center Only)    Standing Status:  Future    Expected Date:   12/06/2023    Expiration Date:   12/05/2024   CMP (Cancer Center only)    Standing Status:   Future    Expected Date:   12/06/2023    Expiration Date:   12/05/2024    All questions were answered. The patient knows to call the clinic with any problems, questions or concerns. The total time spent in the appointment was 40 minutes encounter with patients including review of chart and various tests results, discussions about plan of care and coordination of care plan   Artis Delay, MD 10/04/2023 3:35 PM  INTERVAL HISTORY: Please see below for problem oriented charting. she returns for chemo follow-up She is here accompanied by her husband She has been having significant lower back pain radiating down to her leg She has appointment to see orthopedic doctor She complains of altered taste sensation and poor appetite She have to force herself to eat We discussed test results and future follow-up  REVIEW OF SYSTEMS:   Constitutional: Denies fevers, chills or abnormal weight  loss Eyes: Denies blurriness of vision Ears, nose, mouth, throat, and face: Denies mucositis or sore throat Respiratory: Denies cough, dyspnea or wheezes Cardiovascular: Denies palpitation, chest discomfort or lower extremity swelling Gastrointestinal:  Denies nausea, heartburn or change in bowel habits Skin: Denies abnormal skin rashes Lymphatics: Denies new lymphadenopathy or easy bruising Behavioral/Psych: Mood is stable, no new changes  All other systems were reviewed with the patient and are negative.  I have reviewed the past medical history, past surgical history, social history and family history with the patient and they are unchanged from previous note.  ALLERGIES:  is allergic to lisinopril.  MEDICATIONS:  Current Outpatient Medications  Medication Sig Dispense Refill   antiseptic oral rinse (BIOTENE) LIQD 15 mLs by Mouth Rinse route as needed for dry mouth. 120 mL 0   atenolol (TENORMIN) 100 MG tablet Take 1 tablet (100 mg total) by mouth daily. 90 tablet 0   cholecalciferol (VITAMIN D3) 25 MCG (1000 UNIT) tablet Take 2,000 Units by mouth daily.     diphenhydrAMINE (BENADRYL ALLERGY) 25 MG tablet Take 2 tablets (50 mg total) by mouth every 6 (six) hours as needed for allergies. 30 tablet 0   ezetimibe (ZETIA) 10 MG tablet Take 10 mg by mouth in the morning.     glimepiride (AMARYL) 1 MG tablet Take 1 mg by mouth daily with breakfast.     levothyroxine (SYNTHROID) 50 MCG tablet Take 1 tablet (50 mcg total) by mouth daily before breakfast. 30 tablet 1   lidocaine-prilocaine (EMLA) cream Apply 1 Application topically as needed (for port access). 30 g 3   ondansetron (ZOFRAN) 8 MG tablet TAKE 1 TABLET(8 MG) BY MOUTH EVERY 8 HOURS AS NEEDED FOR NAUSEA OR VOMITING. START ON THE THIRD DAY AFTER CHEMOTHERAPY 30 tablet 1   prochlorperazine (COMPAZINE) 10 MG tablet Take 1 tablet (10 mg total) by mouth every 6 (six) hours as needed for nausea or vomiting. 30 tablet 1   rosuvastatin  (CRESTOR) 40 MG tablet Take 40 mg by mouth at bedtime.     sitaGLIPtin (JANUVIA) 100 MG tablet Take 50 mg by mouth in the morning.     traMADol (ULTRAM) 50 MG tablet Take 2 tablets (100 mg total) by mouth every 6 (six) hours as needed. (Patient taking differently: Take 100 mg by mouth every 6 (six) hours as needed for moderate pain (pain score 4-6).) 90 tablet 1   TYLENOL  500 MG tablet Take 500-1,000 mg by mouth every 6 (six) hours as needed for mild pain or headache.     vitamin B-12 (CYANOCOBALAMIN) 1000 MCG tablet Take 1,000 mcg by mouth in the morning. (Patient not taking: Reported on 07/13/2023)     No current facility-administered medications for this visit.   Facility-Administered Medications Ordered in Other Visits  Medication Dose Route Frequency Provider Last Rate Last Admin   bevacizumab-awwb (MVASI) 400 mg in sodium chloride 0.9 % 100 mL chemo infusion  5 mg/kg (Treatment Plan Recorded) Intravenous Once Bertis Ruddy, Aayan Haskew, MD       heparin lock flush 100 unit/mL  500 Units Intracatheter Once PRN Bertis Ruddy, Kerby Hockley, MD       pembrolizumab (KEYTRUDA) 200 mg in sodium chloride 0.9 % 50 mL chemo infusion  200 mg Intravenous Once Leonardo Makris, MD       sodium chloride flush (NS) 0.9 % injection 10 mL  10 mL Intracatheter PRN Artis Delay, MD        SUMMARY OF ONCOLOGIC HISTORY: Oncology History Overview Note  Adenocarcinoma, 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   08/27/2020 Imaging   MRI pelvis Large cervical mass with pelvic adenopathy, at least T2B N1 disease. Question of involvement of posterior urinary bladder (T4) for which additional images have been requested. Patient will return for additional images to answer this question. (Thinner section axial T2 without fat sat and sagittal T2 weighted imaging also  without fat saturation.)   Small field-of-view images show that the parametrial extension comes in very close proximity to the RIGHT ureter.   08/30/2020 Initial Diagnosis   Cervical cancer (HCC)   08/30/2020 Cancer Staging   Staging form: Cervix Uteri, AJCC Version 9 - Clinical stage from 08/30/2020: FIGO Stage IVB (rcT2, cN1, pM1) - Signed by Artis Delay, MD on 09/01/2022 Stage prefix: Recurrence   09/01/2020 PET scan   1. Large cervical mass extending into the endometrial canal to the level of the uterine fundus, associated with pelvic adenopathy to the level of the RIGHT common iliac chain. Please refer to MRI for further detail. 2. No signs of solid organ uptake or metastatic disease to the chest. 3. Subcutaneous nodule with marked increased metabolic activity, correlation with direct clinical inspection is suggested to exclude cutaneous neoplasm or subcutaneous nodule in this location. Complicated sebaceous cyst could also potentially have this appearance. 4. Uptake in the anal canal could likely physiologic. Given the intense nature of the uptake would suggest correlation with direct clinical inspection/examination. 5. Uptake in axillary lymph nodes with activity less than mediastinal blood pool likely small reactive lymph nodes, attention on follow-up. Morphologic features also with benign appearance.   09/16/2020 Procedure   Successful placement of a power injectable Port-A-Cath via the right internal jugular vein. The catheter is ready for immediate use.     09/20/2020 - 10/25/2020 Chemotherapy   She received cisplatin chemo with concurrent radiation       09/22/2020 - 12/08/2020 Radiation Therapy   Radiation Treatment Dates: 09/22/2020 through 12/08/2020   Site: Pelvis Technique: IMRT Total Dose (Gy): 45/45 Dose per Fx (Gy): 1.8 Completed Fx: 25/25 Beam Energies: 6x   Site: pelvic boost Technique: IMRT Total Dose (Gy): 7.2/7.2 Dose per Fx (Gy): 1.8 Completed Fx:  4/4 Beam Energies: 6x   Site: Cervix boost Technique: HDR-brachytherapy Total  Dose (Gy): 27.5/27.5 Dose per Fx (Gy): 5.5 Completed Fx: 5/5 Beam Energies: Ir-192     03/17/2021 PET scan   1. Mark positive response to therapy. 2. Resolution of metabolic activity within the uterine body. 3. Resolution of size and metabolic activity of pelvic lymph nodes. 4. No evidence of new disease outside of the pelvis.   08/10/2022 PET scan   1. New hypermetabolic bilateral pulmonary nodules and hypermetabolic cervical and hilar lymph nodes, consistent with metastatic disease. 2. Hypermetabolic focus in the anterior right third costochondral junction without discrete CT correlate is also compatible with metastatic disease. 3. Prior hysterectomy without suspicious hypermetabolic nodularity along the vaginal cuff. 4.  Aortic Atherosclerosis (ICD10-I70.0).   08/24/2022 Pathology Results   A. SOFT TISSUE NODULE, RIGHT CHEST WALL, NEEDLE CORE BIOPSY:  Poorly differentiated carcinoma.  See comment.   COMMENT:  The carcinoma is characterized by nest of malignant epithelioid cells which focally has features suggestive of squamous differentiation morphologically.  Immunohistochemistry is positive with cytokeratin 7, PAX8, MOC-31 and estrogen receptor.  There is patchy positivity with p16, progesterone receptor and TTF-1.  The tumor is negative with p40, cytokeratin 5/6, p63, CDX2, cytokeratin 20 and WT-1.  The immunophenotype is consistent with adenocarcinoma and suggestive of a gynecologic primary.    09/08/2022 -  Chemotherapy   Patient is on Treatment Plan : OVARIAN Carboplatin + Paclitaxel + Bevacizumab q21d      11/07/2022 Imaging   1. Decreased size of the pulmonary nodules and mediastinal adenopathy, consistent with treatment response. 2. Similar appearance of the previously hypermetabolic soft tissue subjacent to the right anterior third costochondral cartilage. 3. No convincing evidence of new or  progressive disease in the chest, abdomen or pelvis. 4. Rectal wall thickening with stranding in the mesorectal fat and diffuse thickening of the urinary bladder wall, nonspecific but possibly reflecting post radiation change. 5. Nonobstructive right renal stones measure up to 6 mm in the renal pelvis. 6. Colonic diverticulosis without findings of acute diverticulitis. 7.  Aortic Atherosclerosis (ICD10-I70.0).   02/26/2023 Imaging   CT CHEST ABDOMEN PELVIS W CONTRAST  Result Date: 02/24/2023 CLINICAL DATA:  Cervical cancer, assess treatment response * Tracking Code: BO * EXAM: CT CHEST, ABDOMEN, AND PELVIS WITH CONTRAST TECHNIQUE: Multidetector CT imaging of the chest, abdomen and pelvis was performed following the standard protocol during bolus administration of intravenous contrast. RADIATION DOSE REDUCTION: This exam was performed according to the departmental dose-optimization program which includes automated exposure control, adjustment of the mA and/or kV according to patient size and/or use of iterative reconstruction technique. CONTRAST:  OMNIPAQUE IOHEXOL 300 MG/ML  SOLN COMPARISON:  CT chest abdomen pelvis, 11/07/2022, PET-CT, 08/10/2022 FINDINGS: CT CHEST FINDINGS Cardiovascular: Right chest port catheter. Normal heart size. No pericardial effusion. Mediastinum/Nodes: No persistently enlarged mediastinal, hilar, or axillary lymph nodes. Benign calcified left hilar lymph nodes. Small hiatal hernia. Thyroid gland, trachea, and esophagus demonstrate no significant findings. Lungs/Pleura: Near complete resolution of previously seen bilateral pulmonary nodules, only tiny treated residual remaining, for example a 0.5 cm irregular nodule of the peripheral right apex (series 4, image 28) and a linear residua in the medial left upper lobe (series 4, image 42) benign calcified nodule of the anterior left upper lobe, for which no further follow-up or characterization is required (series 4, image 46).  Scarring and volume loss of the left lung base (series 4, image 105). No pleural effusion or pneumothorax. Musculoskeletal: No chest wall abnormality. Previously FDG avid soft tissue nodule in the  anterior right third chondral cartilage is not well appreciated by today's CT (series 2, image 24). No acute osseous findings. CT ABDOMEN PELVIS FINDINGS Hepatobiliary: No solid liver abnormality is seen. No gallstones, gallbladder wall thickening, or biliary dilatation. Pancreas: Unremarkable. No pancreatic ductal dilatation or surrounding inflammatory changes. Spleen: Normal in size. Punctuate granulomatous calcifications of the spleen. Adrenals/Urinary Tract: Adrenal glands are unremarkable. Nonobstructive calculi of the superior pole of the right kidney as well as the right renal pelvis, renal pelvic calculus measuring 0.8 cm (series 2, image 66). The left kidney is normal, without renal calculi, solid lesion, or hydronephrosis. Unchanged diffuse bladder wall thickening with adjacent fat stranding (series 6, image 106) Stomach/Bowel: Stomach is within normal limits. Appendix is not clearly visualized and may be surgically absent. No evidence of bowel wall thickening, distention, or inflammatory changes. Moderate burden of stool throughout the colon and rectum. Sigmoid diverticulosis. Unchanged long segment wall thickening involving the distal sigmoid colon and rectum, particularly with circumferential wall thickening and perirectal fat spiculation of the rectum (series 2, image 101) Vascular/Lymphatic: Aortic atherosclerosis. No enlarged abdominal or pelvic lymph nodes. Reproductive: No directly visualized cervical mass. Other: No abdominal wall hernia or abnormality. No ascites. Musculoskeletal: No acute osseous findings. Unchanged sclerotic inferior endplate deformity of L5 (series 6, image 100). IMPRESSION: 1. No directly visualized cervical mass. 2. Near complete resolution of previously seen pulmonary nodules,  with only small residua, consistent with continued treatment response of pulmonary metastatic disease. 3. No persistently enlarged mediastinal lymph nodes, consistent with sustained treatment response of nodal metastatic disease. 4. No appreciable CT correlate of a previously seen FDG avid soft tissue nodule within the anterior right third costochondral cartilage. 5. Unchanged long segment wall thickening involving the distal sigmoid colon and rectum, particularly with circumferential wall thickening and perirectal fat spiculation of the rectum, as well as diffuse bladder wall thickening. 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 Attention on follow-up. 6. Nonobstructive right nephrolithiasis. Aortic Atherosclerosis (ICD10-I70.0). Electronically Signed   By: Jearld Lesch M.D.   On: 02/24/2023 16:31      05/17/2023 Imaging   CT CHEST ABDOMEN PELVIS W CONTRAST  Result Date: 05/20/2023 CLINICAL DATA:  History of cervical cancer, assess treatment response. * Tracking Code: BO * EXAM: CT CHEST, ABDOMEN, AND PELVIS WITH CONTRAST TECHNIQUE: Multidetector CT imaging of the chest, abdomen and pelvis was performed following the standard protocol during bolus administration of intravenous contrast. RADIATION DOSE REDUCTION: This exam was performed according to the departmental dose-optimization program which includes automated exposure control, adjustment of the mA and/or kV according to patient size and/or use of iterative reconstruction technique. CONTRAST:  OMNIPAQUE IOHEXOL 300 MG/ML  SOLN COMPARISON:  Multiple priors including CT February 23, 2023. FINDINGS: CT CHEST FINDINGS Cardiovascular: Accessed right chest Port-A-Cath with tip at the superior cavoatrial junction no central pulmonary embolus on this nondedicated study. Normal size heart. No significant pericardial effusion/thickening. Mediastinum/Nodes: No suspicious thyroid nodule. No pathologically enlarged mediastinal,  hilar or axillary lymph nodes small hiatal hernia Lungs/Pleura: Stable pulmonary nodules. For reference the previously indexed right upper lobe pulmonary nodule measures 4 mm on image 36/6, unchanged. Cluster of new tiny nodules in the right lower lobe measure up to 3 mm on image 85/6. Scattered atelectasis/scarring. Left upper lobe calcified granuloma. No pleural effusion. No pneumothorax Musculoskeletal: No aggressive lytic or blastic lesion of bone. Multilevel degenerative changes spine. CT ABDOMEN PELVIS FINDINGS Hepatobiliary: No suspicious hepatic lesion. Gallbladder is unremarkable. No biliary ductal dilation.  Pancreas: No pancreatic ductal dilation or evidence of acute inflammation. Spleen: Calcified splenic granulomata Adrenals/Urinary Tract: Bilateral adrenal glands appear normal. Nonobstructive bilateral renal calculi including a 7 mm calculus in the right renal pelvis. Similar perivesicular stranding besides a nondistended urinary bladder Stomach/Bowel: Stomach is unremarkable for degree of distension. No pathologic dilation of small or large bowel. Colonic diverticulosis. Similar mild wall thickening of the distal sigmoid colon/rectum with adjacent fat stranding. Vascular/Lymphatic: Aortic atherosclerosis. Smooth IVC contours. The portal, splenic and superior mesenteric veins are patent. No pathologically enlarged abdominal or pelvic lymph nodes. Reproductive: No discrete cervical mass. No suspicious adnexal lesion. Other: No significant abdominopelvic free fluid. No discrete peritoneal or omental nodularity Musculoskeletal: No aggressive lytic or blastic lesion of bone. Similar sclerotic endplate deformity at L5. Probable postradiation change in the sacrum. Multilevel degenerative change of the spine. IMPRESSION: 1. Previously indexed and non indexed pulmonary nodules are stable. Cluster of new tiny nodules in the right lower lobe measure up to 3 mm, favored infectious/inflammatory. Suggest attention  on short-term interval follow-up chest CT. 2. No convincing evidence of new or progressive metastatic disease in the chest, abdomen or pelvis. 3. Similar mild perivesicular stranding and mild wall thickening of the distal sigmoid colon/rectum with adjacent fat stranding, most consistent with postradiation proctitis/cystitis. 4. Nonobstructive bilateral renal calculi including a 7 mm calculus in the right renal pelvis. 5.  Aortic Atherosclerosis (ICD10-I70.0). Electronically Signed   By: Maudry Mayhew M.D.   On: 05/20/2023 15:13      07/06/2023 Imaging   DG Abd 2 Views  Result Date: 07/13/2023 CLINICAL DATA:  Vomiting. EXAM: ABDOMEN - 2 VIEW COMPARISON:  None Available. FINDINGS: No bowel dilatation or evidence of obstruction. No free air. Radiopaque calculi over the right kidney measure up to 6 mm over the lower pole. The soft tissues are unremarkable. No acute osseous pathology. IMPRESSION: 1. Nonobstructive bowel gas pattern. 2. Right renal calculi. Electronically Signed   By: Elgie Collard M.D.   On: 07/13/2023 23:13   CT ABDOMEN PELVIS W CONTRAST  Result Date: 07/06/2023 CLINICAL DATA:  Left lower quadrant pain. Fever, nausea, vomiting and diarrhea. History of cervical cancer with lung metastases. EXAM: CT ABDOMEN AND PELVIS WITH CONTRAST TECHNIQUE: Multidetector CT imaging of the abdomen and pelvis was performed using the standard protocol following bolus administration of intravenous contrast. RADIATION DOSE REDUCTION: This exam was performed according to the departmental dose-optimization program which includes automated exposure control, adjustment of the mA and/or kV according to patient size and/or use of iterative reconstruction technique. CONTRAST:  OMNIPAQUE IOHEXOL 300 MG/ML  SOLN COMPARISON:  CT 05/17/2023 and older. FINDINGS: Lower chest: Lung bases are grossly clear. No pleural effusion. Trace pericardial fluid. Hepatobiliary: Fatty liver infiltration. No space-occupying liver  lesion. Hepatic granulomas. Patent portal vein. Gallbladder is distended. Pancreas: Unremarkable. No pancreatic ductal dilatation or surrounding inflammatory changes. Spleen: Splenic granulomas are identified. Small splenule anteriorly and in the hilum. Adrenals/Urinary Tract: Adrenal glands are preserved. No left-sided enhancing renal mass or collecting system dilatation. The left ureter has a normal course and caliber down to the bladder. The bladder is contracted with some wall thickening and stranding. Please correlate for any clinical evidence of cystitis. Bifid left renal collecting system. The previous 7 mm stone in the right renal pelvis is now in the lower pole of the right kidney. Second nonobstructing upper pole stone is stable. However there is new moderate right renal collecting system dilatation with increasing perinephric stranding. There is some urothelial  thickening as well along the course of the right renal pelvis and ureter. The ureter does slowly taper to a more normal caliber towards the UVJ. No distal ureteral stone. Please correlate for any signs of urothelial infection. Stomach/Bowel: Stomach is relatively decompressed. The small bowel is nondilated. Large bowel has a normal course and caliber with some scattered colonic diverticula particularly of the sigmoid colon. There is wall thickening seen in the area of the sigmoid colon with the adjacent stranding and thickening of the mesorectal fascia. Vascular/Lymphatic: Aortic atherosclerosis. No enlarged abdominal or pelvic lymph nodes. Reproductive: Uterus is present. No adnexal mass. Increasing soft tissue stranding. Other: No free air.  No developing ascites. Musculoskeletal: Scattered degenerative changes of the spine and pelvis. Stable compression of the inferior endplate of L5 with sclerosis. IMPRESSION: Interval development of ectasia of the right renal collecting system with urothelial thickening and stranding. There are some  nonobstructing intrarenal stones but no ureteral stones. Please correlate for any known history including for a urothelial infection. The urinary bladder is also thickened and there is some adjacent stranding. Please correlate for any suggestion a developing stricture. Overall with appearance would recommend follow up to confirm resolution and exclude secondary pathology. Increasing wall thickening along the rectum with perirectal space stranding and thickening of the mesorectal fascia. Please correlate for known history of radiation. 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 Electronically Signed   By: Karen Kays M.D.   On: 07/06/2023 14:43   DG Chest Port 1 View  Result Date: 07/06/2023 CLINICAL DATA:  Questionable sepsis - evaluate for abnormality. Abdominal pain. EXAM: PORTABLE CHEST 1 VIEW COMPARISON:  08/24/2022. FINDINGS: Bilateral lung fields are clear. Again seen is elevated right hemidiaphragm. Bilateral costophrenic angles are clear. Normal cardio-mediastinal silhouette. No acute osseous abnormalities. The soft tissues are within normal limits. Right-sided CT Port-A-Cath is seen with its tip overlying the upper portion of superior vena cava. IMPRESSION: *No active disease. Electronically Signed   By: Jules Schick M.D.   On: 07/06/2023 14:15      09/28/2023 Imaging   CT CHEST ABDOMEN PELVIS W CONTRAST Result Date: 09/29/2023 CLINICAL DATA:  Metastatic cervical cancer, assess treatment response, chemotherapy and XRT complete * Tracking Code: BO * EXAM: CT CHEST, ABDOMEN, AND PELVIS WITH CONTRAST TECHNIQUE: Multidetector CT imaging of the chest, abdomen and pelvis was performed following the standard protocol during bolus administration of intravenous contrast. RADIATION DOSE REDUCTION: This exam was performed according to the departmental dose-optimization program which includes automated exposure control, adjustment of the mA and/or kV according to patient size and/or use of iterative  reconstruction technique. CONTRAST:  OMNIPAQUE IOHEXOL 300 MG/ML  SOLN COMPARISON:  CT chest abdomen pelvis, 05/17/2023 FINDINGS: CT CHEST FINDINGS Cardiovascular: Right chest port catheter. Aortic atherosclerosis. Normal heart size. Left and right coronary artery calcifications. No pericardial effusion. Mediastinum/Nodes: No enlarged mediastinal, hilar, or axillary lymph nodes. Small hiatal hernia. Small benign calcified left hilar lymph nodes. Thyroid gland, trachea, and esophagus demonstrate no significant findings. Lungs/Pleura: Interval enlargement of small adjacent nodules in the dependent left lower lobe measuring 0.3 cm, previously no greater than 0.1 cm (series 6, image 19). Otherwise unchanged small pulmonary nodules, for example measuring 0.3 cm in the peripheral right upper lobe (series 6, image 38), additional nodule in the superior segment right lower lobe measuring 0.4 cm (series 6, image 83). No pleural effusion or pneumothorax. Musculoskeletal: No chest wall abnormality. No acute osseous findings. CT ABDOMEN PELVIS FINDINGS Hepatobiliary: No solid liver abnormality is seen.  No gallstones, gallbladder wall thickening, or biliary dilatation. Pancreas: Unremarkable. No pancreatic ductal dilatation or surrounding inflammatory changes. Spleen: Normal in size without significant abnormality. Punctuate granulomatous calcifications of the spleen. Adrenals/Urinary Tract: Adrenal glands are unremarkable. Small nonobstructive right renal calculi. No left-sided calculi, ureteral calculi, or hydronephrosis. Bladder is unremarkable. Stomach/Bowel: Stomach is within normal limits. Appendix appears normal. No evidence of bowel wall thickening, distention, or inflammatory changes. Sigmoid diverticulosis. Vascular/Lymphatic: No significant vascular findings are present. No enlarged abdominal or pelvic lymph nodes. Reproductive: Status post hysterectomy. Unchanged post treatment/post radiation appearance of the  pelvis with presacral fat stranding (series 2, image 47). Other: No abdominal wall hernia or abnormality. No ascites. Musculoskeletal: No acute osseous findings. IMPRESSION: 1. Interval enlargement of small adjacent nodules in the dependent left lower lobe measuring 0.3 cm, previously no greater than 0.1 cm. These are nonspecific and may be infectious or inflammatory, however suspicious for tiny enlarging metastases. Attention on follow-up warranted. 2. Otherwise unchanged small pulmonary nodules. 3. Status post hysterectomy with unchanged post treatment/post radiation appearance of the pelvis with presacral fat stranding. 4. No evidence of lymphadenopathy or metastatic disease in the abdomen or pelvis. 5. Nonobstructive right nephrolithiasis. 6. Coronary artery disease. Aortic Atherosclerosis (ICD10-I70.0). Electronically Signed   By: Jearld Lesch M.D.   On: 09/29/2023 22:16      Malignant neoplasm of cervix (HCC)  09/01/2020 Initial Diagnosis   Malignant neoplasm of cervix (HCC)   09/20/2020 - 10/25/2020 Chemotherapy   She received cisplatin chemo with concurrent radiation       09/22/2020 - 12/08/2020 Radiation Therapy   Radiation Treatment Dates: 09/22/2020 through 12/08/2020   Site: Pelvis Technique: IMRT Total Dose (Gy): 45/45 Dose per Fx (Gy): 1.8 Completed Fx: 25/25 Beam Energies: 6x   Site: pelvic boost Technique: IMRT Total Dose (Gy): 7.2/7.2 Dose per Fx (Gy): 1.8 Completed Fx: 4/4 Beam Energies: 6x   Site: Cervix boost Technique: HDR-brachytherapy Total Dose (Gy): 27.5/27.5 Dose per Fx (Gy): 5.5 Completed Fx: 5/5 Beam Energies: Ir-192   09/08/2022 -  Chemotherapy   Patient is on Treatment Plan : OVARIAN Carboplatin + Paclitaxel + Bevacizumab q21d        PHYSICAL EXAMINATION: ECOG PERFORMANCE STATUS: 2 - Symptomatic, <50% confined to bed  Vitals:   10/04/23 1342  BP: 117/63  Pulse: (!) 59  Resp: 18  Temp: 98.2 F (36.8 C)  SpO2: 99%   Filed Weights    10/04/23 1342  Weight: 182 lb 3.2 oz (82.6 kg)    GENERAL:alert, no distress and comfortable  LABORATORY DATA:  I have reviewed the data as listed    Component Value Date/Time   NA 135 10/04/2023 1409   K 3.9 10/04/2023 1409   CL 102 10/04/2023 1409   CO2 27 10/04/2023 1409   GLUCOSE 64 (L) 10/04/2023 1409   BUN 17 10/04/2023 1409   CREATININE 1.27 (H) 10/04/2023 1409   CALCIUM 10.3 10/04/2023 1409   PROT 6.2 (L) 10/04/2023 1409   ALBUMIN 3.4 (L) 10/04/2023 1409   AST 20 10/04/2023 1409   ALT 7 10/04/2023 1409   ALKPHOS 73 10/04/2023 1409   BILITOT 0.5 10/04/2023 1409   GFRNONAA 45 (L) 10/04/2023 1409    No results found for: "SPEP", "UPEP"  Lab Results  Component Value Date   WBC 4.3 10/04/2023   NEUTROABS 2.0 10/04/2023   HGB 10.3 (L) 10/04/2023   HCT 31.8 (L) 10/04/2023   MCV 100.6 (H) 10/04/2023   PLT 216 10/04/2023  Chemistry      Component Value Date/Time   NA 135 10/04/2023 1409   K 3.9 10/04/2023 1409   CL 102 10/04/2023 1409   CO2 27 10/04/2023 1409   BUN 17 10/04/2023 1409   CREATININE 1.27 (H) 10/04/2023 1409      Component Value Date/Time   CALCIUM 10.3 10/04/2023 1409   ALKPHOS 73 10/04/2023 1409   AST 20 10/04/2023 1409   ALT 7 10/04/2023 1409   BILITOT 0.5 10/04/2023 1409       RADIOGRAPHIC STUDIES: I have reviewed imaging study with the patient I have personally reviewed the radiological images as listed and agreed with the findings in the report. CT CHEST ABDOMEN PELVIS W CONTRAST Result Date: 09/29/2023 CLINICAL DATA:  Metastatic cervical cancer, assess treatment response, chemotherapy and XRT complete * Tracking Code: BO * EXAM: CT CHEST, ABDOMEN, AND PELVIS WITH CONTRAST TECHNIQUE: Multidetector CT imaging of the chest, abdomen and pelvis was performed following the standard protocol during bolus administration of intravenous contrast. RADIATION DOSE REDUCTION: This exam was performed according to the departmental dose-optimization  program which includes automated exposure control, adjustment of the mA and/or kV according to patient size and/or use of iterative reconstruction technique. CONTRAST:  OMNIPAQUE IOHEXOL 300 MG/ML  SOLN COMPARISON:  CT chest abdomen pelvis, 05/17/2023 FINDINGS: CT CHEST FINDINGS Cardiovascular: Right chest port catheter. Aortic atherosclerosis. Normal heart size. Left and right coronary artery calcifications. No pericardial effusion. Mediastinum/Nodes: No enlarged mediastinal, hilar, or axillary lymph nodes. Small hiatal hernia. Small benign calcified left hilar lymph nodes. Thyroid gland, trachea, and esophagus demonstrate no significant findings. Lungs/Pleura: Interval enlargement of small adjacent nodules in the dependent left lower lobe measuring 0.3 cm, previously no greater than 0.1 cm (series 6, image 19). Otherwise unchanged small pulmonary nodules, for example measuring 0.3 cm in the peripheral right upper lobe (series 6, image 38), additional nodule in the superior segment right lower lobe measuring 0.4 cm (series 6, image 83). No pleural effusion or pneumothorax. Musculoskeletal: No chest wall abnormality. No acute osseous findings. CT ABDOMEN PELVIS FINDINGS Hepatobiliary: No solid liver abnormality is seen. No gallstones, gallbladder wall thickening, or biliary dilatation. Pancreas: Unremarkable. No pancreatic ductal dilatation or surrounding inflammatory changes. Spleen: Normal in size without significant abnormality. Punctuate granulomatous calcifications of the spleen. Adrenals/Urinary Tract: Adrenal glands are unremarkable. Small nonobstructive right renal calculi. No left-sided calculi, ureteral calculi, or hydronephrosis. Bladder is unremarkable. Stomach/Bowel: Stomach is within normal limits. Appendix appears normal. No evidence of bowel wall thickening, distention, or inflammatory changes. Sigmoid diverticulosis. Vascular/Lymphatic: No significant vascular findings are present. No enlarged  abdominal or pelvic lymph nodes. Reproductive: Status post hysterectomy. Unchanged post treatment/post radiation appearance of the pelvis with presacral fat stranding (series 2, image 47). Other: No abdominal wall hernia or abnormality. No ascites. Musculoskeletal: No acute osseous findings. IMPRESSION: 1. Interval enlargement of small adjacent nodules in the dependent left lower lobe measuring 0.3 cm, previously no greater than 0.1 cm. These are nonspecific and may be infectious or inflammatory, however suspicious for tiny enlarging metastases. Attention on follow-up warranted. 2. Otherwise unchanged small pulmonary nodules. 3. Status post hysterectomy with unchanged post treatment/post radiation appearance of the pelvis with presacral fat stranding. 4. No evidence of lymphadenopathy or metastatic disease in the abdomen or pelvis. 5. Nonobstructive right nephrolithiasis. 6. Coronary artery disease. Aortic Atherosclerosis (ICD10-I70.0). Electronically Signed   By: Jearld Lesch M.D.   On: 09/29/2023 22:16

## 2023-10-04 NOTE — Assessment & Plan Note (Signed)
She has profound weight loss due to poor appetite I recommend discontinuation of glimepiride

## 2023-10-05 ENCOUNTER — Other Ambulatory Visit: Payer: Self-pay

## 2023-10-05 LAB — T4: T4, Total: 10.3 ug/dL (ref 4.5–12.0)

## 2023-10-08 ENCOUNTER — Other Ambulatory Visit: Payer: Self-pay

## 2023-10-09 ENCOUNTER — Other Ambulatory Visit: Payer: Self-pay | Admitting: Hematology and Oncology

## 2023-10-10 ENCOUNTER — Other Ambulatory Visit: Payer: Self-pay

## 2023-10-12 ENCOUNTER — Other Ambulatory Visit: Payer: Self-pay

## 2023-10-12 ENCOUNTER — Telehealth: Payer: Self-pay | Admitting: Oncology

## 2023-10-12 ENCOUNTER — Emergency Department (HOSPITAL_BASED_OUTPATIENT_CLINIC_OR_DEPARTMENT_OTHER): Payer: Medicare PPO

## 2023-10-12 ENCOUNTER — Emergency Department (HOSPITAL_BASED_OUTPATIENT_CLINIC_OR_DEPARTMENT_OTHER)
Admission: EM | Admit: 2023-10-12 | Discharge: 2023-10-13 | Disposition: A | Discharge status: Home / Self Care | Payer: Medicare PPO | Attending: Emergency Medicine | Admitting: Emergency Medicine

## 2023-10-12 DIAGNOSIS — C779 Secondary and unspecified malignant neoplasm of lymph node, unspecified: Secondary | ICD-10-CM | POA: Insufficient documentation

## 2023-10-12 DIAGNOSIS — Z7984 Long term (current) use of oral hypoglycemic drugs: Secondary | ICD-10-CM | POA: Diagnosis not present

## 2023-10-12 DIAGNOSIS — R9431 Abnormal electrocardiogram [ECG] [EKG]: Secondary | ICD-10-CM | POA: Insufficient documentation

## 2023-10-12 DIAGNOSIS — R112 Nausea with vomiting, unspecified: Secondary | ICD-10-CM | POA: Insufficient documentation

## 2023-10-12 DIAGNOSIS — C7800 Secondary malignant neoplasm of unspecified lung: Secondary | ICD-10-CM | POA: Diagnosis not present

## 2023-10-12 DIAGNOSIS — Z8541 Personal history of malignant neoplasm of cervix uteri: Secondary | ICD-10-CM | POA: Insufficient documentation

## 2023-10-12 DIAGNOSIS — I672 Cerebral atherosclerosis: Secondary | ICD-10-CM | POA: Insufficient documentation

## 2023-10-12 DIAGNOSIS — C801 Malignant (primary) neoplasm, unspecified: Secondary | ICD-10-CM | POA: Diagnosis not present

## 2023-10-12 DIAGNOSIS — C7951 Secondary malignant neoplasm of bone: Secondary | ICD-10-CM | POA: Insufficient documentation

## 2023-10-12 DIAGNOSIS — R42 Dizziness and giddiness: Secondary | ICD-10-CM | POA: Diagnosis not present

## 2023-10-12 DIAGNOSIS — R000 Tachycardia, unspecified: Secondary | ICD-10-CM | POA: Diagnosis not present

## 2023-10-12 DIAGNOSIS — R079 Chest pain, unspecified: Secondary | ICD-10-CM | POA: Insufficient documentation

## 2023-10-12 DIAGNOSIS — Z20822 Contact with and (suspected) exposure to covid-19: Secondary | ICD-10-CM | POA: Insufficient documentation

## 2023-10-12 LAB — COMPREHENSIVE METABOLIC PANEL
ALT: 8 U/L (ref 0–44)
AST: 22 U/L (ref 15–41)
Albumin: 3.2 g/dL — ABNORMAL LOW (ref 3.5–5.0)
Alkaline Phosphatase: 70 U/L (ref 38–126)
Anion gap: 9 (ref 5–15)
BUN: 11 mg/dL (ref 8–23)
CO2: 25 mmol/L (ref 22–32)
Calcium: 9.9 mg/dL (ref 8.9–10.3)
Chloride: 103 mmol/L (ref 98–111)
Creatinine, Ser: 1.11 mg/dL — ABNORMAL HIGH (ref 0.44–1.00)
GFR, Estimated: 53 mL/min — ABNORMAL LOW (ref >60)
Glucose, Bld: 113 mg/dL — ABNORMAL HIGH (ref 70–99)
Potassium: 3.7 mmol/L (ref 3.5–5.1)
Sodium: 137 mmol/L (ref 135–145)
Total Bilirubin: 0.8 mg/dL (ref 0.0–1.2)
Total Protein: 6.1 g/dL — ABNORMAL LOW (ref 6.5–8.1)

## 2023-10-12 LAB — RESP PANEL BY RT-PCR (RSV, FLU A&B, COVID)  RVPGX2
Influenza A by PCR: NEGATIVE
Influenza B by PCR: NEGATIVE
Resp Syncytial Virus by PCR: NEGATIVE
SARS Coronavirus 2 by RT PCR: NEGATIVE

## 2023-10-12 LAB — CBC
HCT: 30.6 % — ABNORMAL LOW (ref 36.0–46.0)
Hemoglobin: 10.2 g/dL — ABNORMAL LOW (ref 12.0–15.0)
MCH: 32.7 pg (ref 26.0–34.0)
MCHC: 33.3 g/dL (ref 30.0–36.0)
MCV: 98.1 fL (ref 80.0–100.0)
Platelets: 181 10*3/uL (ref 150–400)
RBC: 3.12 MIL/uL — ABNORMAL LOW (ref 3.87–5.11)
RDW: 15.2 % (ref 11.5–15.5)
WBC: 4.3 10*3/uL (ref 4.0–10.5)
nRBC: 0 % (ref 0.0–0.2)

## 2023-10-12 LAB — URINALYSIS, ROUTINE W REFLEX MICROSCOPIC
Bilirubin Urine: NEGATIVE
Glucose, UA: NEGATIVE mg/dL
Hgb urine dipstick: NEGATIVE
Ketones, ur: NEGATIVE mg/dL
Nitrite: NEGATIVE
Protein, ur: 30 mg/dL — AB
Specific Gravity, Urine: 1.018 (ref 1.005–1.030)
pH: 5 (ref 5.0–8.0)

## 2023-10-12 LAB — LIPASE, BLOOD: Lipase: 34 U/L (ref 11–51)

## 2023-10-12 MED ORDER — PROMETHAZINE HCL 25 MG/ML IJ SOLN
INTRAMUSCULAR | Status: AC
  Filled 2023-10-12: qty 1

## 2023-10-12 MED ORDER — PROMETHAZINE (PHENERGAN) 6.25MG IN NS 50ML IVPB
6.2500 mg | Freq: Once | INTRAVENOUS | Status: AC
  Administered 2023-10-12: 6.25 mg via INTRAVENOUS
  Filled 2023-10-12: qty 50

## 2023-10-12 MED ORDER — ONDANSETRON HCL 4 MG/2ML IJ SOLN
4.0000 mg | Freq: Once | INTRAMUSCULAR | Status: AC
  Administered 2023-10-12: 4 mg via INTRAVENOUS
  Filled 2023-10-12 (×2): qty 2

## 2023-10-12 MED ORDER — PROMETHAZINE HCL 12.5 MG PO TABS: 6.2500 mg | ORAL_TABLET | Freq: Four times a day (QID) | ORAL | 0 refills | Status: DC | PRN

## 2023-10-12 NOTE — Telephone Encounter (Signed)
I am sorry she is not well OK to take her to [facility] ER

## 2023-10-12 NOTE — ED Notes (Signed)
Urine collected and walked to lab

## 2023-10-12 NOTE — Telephone Encounter (Signed)
Called Holley back and advised her of message from Dr. Bertis Ruddy.  She is going to go to the [facility] ER.

## 2023-10-12 NOTE — Telephone Encounter (Signed)
Misty Deleon called and said she has not been able to eat anything besides small bits of food. She has been throwing up a lot and not keeping anything down.  She is having a lot of phlegm that she thinks is making her throw up.  She denies having a cough or recent cold. She also mentioned that food doesn't taste good and even the smell of food makes her sick to her stomach.  She is taking Zofran q 8 hours which is not helping and often throws it back up.  She is trying to drink fluids but also throws them up.  She is feeling weak and her husband is concerned and wants her to go to the [facility] ER.  She wants to touch base with Dr. Bertis Ruddy to see if this is ok.

## 2023-10-12 NOTE — ED Triage Notes (Signed)
Pt POV from home reporting weight loss due to food not tasting the same since December since having cdiff. Multiple episodes of emesis in the past week. CA pt, Kaytruda infusions at this time. Denies cdiff sx, endorses fatigue.

## 2023-10-12 NOTE — ED Provider Notes (Signed)
 [place] EMERGENCY DEPARTMENT AT [facility] Provider Note   CSN: [id number] Arrival date & time: 10/12/23  1613     History Chief Complaint  Patient presents with   Emesis    Misty Deleon is a 73 y.o. female. Patient with a history cervical cancer, metastatic spread to the lungs, ribs, and lymph nodes as well as recent admission following C. Diff infection with concerns of vomiting. She states that she has been having episode nausea and vomiting typically centered around smells and visual exposure to food making it difficult to eat or drink much. States that she is not nauseous all the time, but only occasionally when things are triggered by what seems to be a food aversion.   Emesis      Home Medications Prior to Admission medications   Medication Sig Start Date End Date Taking? Authorizing Provider  promethazine (PHENERGAN) 12.5 MG tablet Take 0.5 tablets (6.25 mg total) by mouth every 6 (six) hours as needed for nausea or vomiting. 10/12/23  Yes Smitty Knudsen, PA-C  antiseptic oral rinse (BIOTENE) LIQD 15 mLs by Mouth Rinse route as needed for dry mouth. 07/11/23   Marinda Elk, MD  atenolol (TENORMIN) 100 MG tablet Take 1 tablet (100 mg total) by mouth daily. 07/18/23   Artis Delay, MD  cholecalciferol (VITAMIN D3) 25 MCG (1000 UNIT) tablet Take 2,000 Units by mouth daily.    [provider]  diphenhydrAMINE (BENADRYL ALLERGY) 25 MG tablet Take 2 tablets (50 mg total) by mouth every 6 (six) hours as needed for allergies. 01/05/23   Simonne Martinet, NP  ezetimibe (ZETIA) 10 MG tablet Take 10 mg by mouth in the morning.    [provider]  glimepiride (AMARYL) 1 MG tablet Take 1 mg by mouth daily with breakfast. 07/07/22   [provider]  levothyroxine (SYNTHROID) 50 MCG tablet TAKE 1 TABLET(50 MCG) BY MOUTH DAILY BEFORE BREAKFAST 10/09/23   Artis Delay, MD  lidocaine-prilocaine (EMLA) cream Apply 1 Application topically as needed  (for port access). 06/12/23   Artis Delay, MD  ondansetron (ZOFRAN) 8 MG tablet TAKE 1 TABLET(8 MG) BY MOUTH EVERY 8 HOURS AS NEEDED FOR NAUSEA OR VOMITING. START ON THE THIRD DAY AFTER CHEMOTHERAPY 09/26/23   Artis Delay, MD  prochlorperazine (COMPAZINE) 10 MG tablet Take 1 tablet (10 mg total) by mouth every 6 (six) hours as needed for nausea or vomiting. 09/01/22   Artis Delay, MD  rosuvastatin (CRESTOR) 40 MG tablet Take 40 mg by mouth at bedtime.    [provider]  sitaGLIPtin (JANUVIA) 100 MG tablet Take 50 mg by mouth in the morning. 03/30/22   [provider]  traMADol (ULTRAM) 50 MG tablet Take 2 tablets (100 mg total) by mouth every 6 (six) hours as needed. Patient taking differently: Take 100 mg by mouth every 6 (six) hours as needed for moderate pain (pain score 4-6). 07/03/23   Artis Delay, MD  TYLENOL 500 MG tablet Take 500-1,000 mg by mouth every 6 (six) hours as needed for mild pain or headache.    [provider]  vitamin B-12 (CYANOCOBALAMIN) 1000 MCG tablet Take 1,000 mcg by mouth in the morning. Patient not taking: Reported on 07/13/2023    [provider]      Allergies    Lisinopril    Review of Systems   Review of Systems  Gastrointestinal:  Positive for vomiting.  All other systems reviewed and are negative.   Physical Exam  Updated Vital Signs BP 106/77   Pulse 80   Temp 98.1 F (36.7 C) (Oral)   Resp (!) 21   Ht 5\' 2"  (1.575 m)   SpO2 97%   BMI 33.32 kg/m  Physical Exam Vitals and nursing note reviewed.  Constitutional:      General: She is not in acute distress.    Appearance: She is well-developed.  HENT:     Head: Normocephalic and atraumatic.  Eyes:     Conjunctiva/sclera: Conjunctivae normal.  Cardiovascular:     Rate and Rhythm: Normal rate and regular rhythm.     Heart sounds: No murmur heard. Pulmonary:     Effort: Pulmonary effort is normal. No respiratory distress.     Breath sounds: Normal breath  sounds.  Abdominal:     General: Abdomen is flat. Bowel sounds are normal. There is no distension.     Palpations: Abdomen is soft. There is no mass.     Tenderness: There is no abdominal tenderness. There is no guarding.  Musculoskeletal:        General: No swelling.     Cervical back: Neck supple.  Skin:    General: Skin is warm and dry.     Capillary Refill: Capillary refill takes less than 2 seconds.  Neurological:     Mental Status: She is alert.  Psychiatric:        Mood and Affect: Mood normal.     ED Results / Procedures / Treatments   Labs (all labs ordered are listed, but only abnormal results are displayed) Labs Reviewed  COMPREHENSIVE METABOLIC PANEL - Abnormal; Notable for the following components:      Result Value   Glucose, Bld 113 (*)    Creatinine, Ser 1.11 (*)    Total Protein 6.1 (*)    Albumin 3.2 (*)    GFR, Estimated 53 (*)    All other components within normal limits  CBC - Abnormal; Notable for the following components:   RBC 3.12 (*)    Hemoglobin 10.2 (*)    HCT 30.6 (*)    All other components within normal limits  URINALYSIS, ROUTINE W REFLEX MICROSCOPIC - Abnormal; Notable for the following components:   Protein, ur 30 (*)    Leukocytes,Ua SMALL (*)    Bacteria, UA RARE (*)    All other components within normal limits  RESP PANEL BY RT-PCR (RSV, FLU A&B, COVID)  [id number]  LIPASE, BLOOD    EKG None  Radiology CT Head Wo Contrast Result Date: 10/12/2023 CLINICAL DATA:  Vertigo, central dizziness, nausea, 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 RADIATION DOSE REDUCTION: This exam was performed according to the departmental dose-optimization program which includes automated exposure control, adjustment of the mA and/or kV according to patient size and/or use of iterative  reconstruction technique. COMPARISON:  MRI head June 20, 2007. FINDINGS: Brain: No evidence of acute infarction, hemorrhage, hydrocephalus, extra-axial collection or mass lesion/mass effect. Partially empty sella. Vascular: Calcific atherosclerosis. Skull: No acute fracture. Sinuses/Orbits: Clear sinuses.  No acute orbital findings. Other: No sizable mastoid effusions. IMPRESSION: No evidence of acute intracranial abnormality. Electronically Signed   By: Feliberto Harts M.D.   On: 10/12/2023 22:45    Procedures Procedures    Medications Ordered in ED Medications  ondansetron (ZOFRAN) injection 4 mg (4 mg Intravenous Given 10/12/23  2137)  promethazine (PHENERGAN) 6.25 mg/NS 50 mL IVPB (0 mg Intravenous Stopped 10/12/23 2314)    ED Course/ Medical Decision Making/ A&P                                 Medical Decision Making Amount and/or Complexity of Data Reviewed Labs: ordered. Radiology: ordered.  Risk Prescription drug management.   This patient presents to the ED for concern of vomiting. Differential diagnosis includes worsening metastatic spread, AKI, gastroenteritis, food aversions, dehydration   Lab Tests:  I Ordered, and personally interpreted labs.  The pertinent results include: CBC and CMP unremarkable with baseline hydration status with no acute electrolyte disturbances, UA without signs of infection the patient is asymptomatic, lipase unremarkable, respiratory panel negative for COVID-19, influenza A&B, RSV   Imaging Studies ordered:  I ordered imaging studies including CT head I independently visualized and interpreted imaging which showed no evidence of any acute intracranial normality I agree with the radiologist interpretation   Medicines ordered and prescription drug management:  I ordered medication including Phenergan for nausea Reevaluation of the patient after these medicines showed that the patient improved I have reviewed the patients home  medicines and have made adjustments as needed   Problem List / ED Course:  Patient with past history significant for cervical cancer, metastatic spread to the lungs, ribs, lymph nodes as well as recent admission for C. difficile infection here with concerns of nausea and vomiting.  She states that she been having episodes of nausea and vomiting off and on for the last month or so.  Appears that she will typically have some sort of food aversion type reaction to either a smell or side of food and this triggers her nausea with dry heaving following.  States that she is having hard time eating or drinking anything.  No recent fever chills or bodyaches.  No diarrhea. Physical exam is unremarkable as there is no focal abdominal tenderness.  Normal bowel sounds.  Doubt bowel obstruction.  Cap refill still less than 2 so doubt severe dehydration.  Will obtain labs for assessment.  Zofran ordered for nausea control. Patient refused Zofran stating that she is not actively nauseous. Labs are unremarkable.  Will trial a dose of Phenergan for further nausea control.  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  Will monitor for any cardiac arrhythmias or abnormalities. Patient tolerated promethazine without any complications.  Able to tolerate oral intake.  Given history of prolonged QTc, I am apprehensive to send patient home with this prescription although I feel that if she is discharged home with Zofran, she would likely return as this has not been helping with any of her nausea.  I did advise patient to follow-up with her oncologist for recommendations on further nausea and vomiting control as she may be having some possible reactions to some of her medications.  A prescription for 6.25 mg promethazine sent to patient's pharmacy for further nausea control.  At this time patient is otherwise stable for outpatient follow-up.  Discharged home in good  condition.   Social Determinants of Health:  History of metastatic cancer.  Final Clinical Impression(s) / ED Diagnoses Final diagnoses:  Nausea and vomiting, unspecified vomiting type    Rx / DC Orders ED Discharge Orders          Ordered    promethazine (PHENERGAN) 12.5  MG tablet  Every 6 hours PRN        10/12/23 2359              Smitty Knudsen, PA-C 10/13/23 0007    Franne Forts, DO 10/18/23 1610

## 2023-10-12 NOTE — ED Notes (Signed)
Pt provided with water for PO challenge.

## 2023-10-13 ENCOUNTER — Other Ambulatory Visit: Payer: Self-pay | Admitting: Hematology and Oncology

## 2023-10-13 MED ORDER — HEPARIN SOD (PORK) LOCK FLUSH 100 UNIT/ML IV SOLN
500.0000 [IU] | Freq: Once | INTRAVENOUS | Status: AC
  Administered 2023-10-13: 500 [IU]
  Filled 2023-10-13: qty 5

## 2023-10-13 NOTE — Discharge Instructions (Signed)
You were seen in the ER today for concerns of nausea and vomiting. Your labs and imaging were thankfully reassuring without any signs of severe dehydration, however I suspect your symptoms may be due to a combination of medications you are currently receiving for your cancer treatment. I would discuss this with your oncologist and other strategies for better nausea control. You had improvement with promethazine so I have sent a low dose of this medication to your pharmacy to take for severe nausea. Please return to the ER for any new or worsening symptoms, otherwise follow up with your oncologist.

## 2023-10-15 ENCOUNTER — Emergency Department (HOSPITAL_COMMUNITY): Payer: Medicare PPO

## 2023-10-15 ENCOUNTER — Other Ambulatory Visit: Payer: Self-pay

## 2023-10-15 ENCOUNTER — Inpatient Hospital Stay (HOSPITAL_COMMUNITY)
Admission: EM | Admit: 2023-10-15 | Discharge: 2023-11-10 | DRG: 640 | Disposition: E | Payer: Medicare PPO | Attending: Internal Medicine | Admitting: Internal Medicine

## 2023-10-15 ENCOUNTER — Telehealth: Payer: Self-pay

## 2023-10-15 DIAGNOSIS — R609 Edema, unspecified: Secondary | ICD-10-CM | POA: Diagnosis not present

## 2023-10-15 DIAGNOSIS — Z79890 Hormone replacement therapy (postmenopausal): Secondary | ICD-10-CM

## 2023-10-15 DIAGNOSIS — R6521 Severe sepsis with septic shock: Secondary | ICD-10-CM | POA: Diagnosis not present

## 2023-10-15 DIAGNOSIS — R918 Other nonspecific abnormal finding of lung field: Secondary | ICD-10-CM | POA: Diagnosis not present

## 2023-10-15 DIAGNOSIS — M4856XA Collapsed vertebra, not elsewhere classified, lumbar region, initial encounter for fracture: Secondary | ICD-10-CM | POA: Diagnosis present

## 2023-10-15 DIAGNOSIS — R591 Generalized enlarged lymph nodes: Secondary | ICD-10-CM | POA: Diagnosis present

## 2023-10-15 DIAGNOSIS — M5442 Lumbago with sciatica, left side: Secondary | ICD-10-CM

## 2023-10-15 DIAGNOSIS — B370 Candidal stomatitis: Secondary | ICD-10-CM | POA: Diagnosis present

## 2023-10-15 DIAGNOSIS — E872 Acidosis, unspecified: Secondary | ICD-10-CM | POA: Diagnosis not present

## 2023-10-15 DIAGNOSIS — D519 Vitamin B12 deficiency anemia, unspecified: Secondary | ICD-10-CM | POA: Diagnosis present

## 2023-10-15 DIAGNOSIS — T730XXA Starvation, initial encounter: Secondary | ICD-10-CM | POA: Diagnosis present

## 2023-10-15 DIAGNOSIS — M546 Pain in thoracic spine: Secondary | ICD-10-CM

## 2023-10-15 DIAGNOSIS — E039 Hypothyroidism, unspecified: Secondary | ICD-10-CM | POA: Diagnosis present

## 2023-10-15 DIAGNOSIS — K219 Gastro-esophageal reflux disease without esophagitis: Secondary | ICD-10-CM | POA: Diagnosis present

## 2023-10-15 DIAGNOSIS — Z9221 Personal history of antineoplastic chemotherapy: Secondary | ICD-10-CM

## 2023-10-15 DIAGNOSIS — M48061 Spinal stenosis, lumbar region without neurogenic claudication: Secondary | ICD-10-CM | POA: Diagnosis not present

## 2023-10-15 DIAGNOSIS — D630 Anemia in neoplastic disease: Secondary | ICD-10-CM | POA: Diagnosis present

## 2023-10-15 DIAGNOSIS — R197 Diarrhea, unspecified: Secondary | ICD-10-CM | POA: Diagnosis present

## 2023-10-15 DIAGNOSIS — E440 Moderate protein-calorie malnutrition: Secondary | ICD-10-CM | POA: Diagnosis present

## 2023-10-15 DIAGNOSIS — Z888 Allergy status to other drugs, medicaments and biological substances status: Secondary | ICD-10-CM

## 2023-10-15 DIAGNOSIS — C539 Malignant neoplasm of cervix uteri, unspecified: Secondary | ICD-10-CM | POA: Diagnosis present

## 2023-10-15 DIAGNOSIS — M5451 Vertebrogenic low back pain: Secondary | ICD-10-CM | POA: Diagnosis not present

## 2023-10-15 DIAGNOSIS — Z95828 Presence of other vascular implants and grafts: Secondary | ICD-10-CM

## 2023-10-15 DIAGNOSIS — E1122 Type 2 diabetes mellitus with diabetic chronic kidney disease: Secondary | ICD-10-CM | POA: Diagnosis present

## 2023-10-15 DIAGNOSIS — E7800 Pure hypercholesterolemia, unspecified: Secondary | ICD-10-CM | POA: Diagnosis present

## 2023-10-15 DIAGNOSIS — E66812 Obesity, class 2: Secondary | ICD-10-CM | POA: Diagnosis present

## 2023-10-15 DIAGNOSIS — C799 Secondary malignant neoplasm of unspecified site: Secondary | ICD-10-CM | POA: Diagnosis present

## 2023-10-15 DIAGNOSIS — Z923 Personal history of irradiation: Secondary | ICD-10-CM

## 2023-10-15 DIAGNOSIS — R112 Nausea with vomiting, unspecified: Secondary | ICD-10-CM | POA: Diagnosis present

## 2023-10-15 DIAGNOSIS — E876 Hypokalemia: Secondary | ICD-10-CM | POA: Diagnosis not present

## 2023-10-15 DIAGNOSIS — E871 Hypo-osmolality and hyponatremia: Secondary | ICD-10-CM | POA: Diagnosis not present

## 2023-10-15 DIAGNOSIS — Z6834 Body mass index (BMI) 34.0-34.9, adult: Secondary | ICD-10-CM | POA: Diagnosis not present

## 2023-10-15 DIAGNOSIS — R627 Adult failure to thrive: Principal | ICD-10-CM | POA: Diagnosis present

## 2023-10-15 DIAGNOSIS — I10 Essential (primary) hypertension: Secondary | ICD-10-CM | POA: Diagnosis not present

## 2023-10-15 DIAGNOSIS — Z66 Do not resuscitate: Secondary | ICD-10-CM | POA: Diagnosis not present

## 2023-10-15 DIAGNOSIS — E282 Polycystic ovarian syndrome: Secondary | ICD-10-CM | POA: Diagnosis present

## 2023-10-15 DIAGNOSIS — M47817 Spondylosis without myelopathy or radiculopathy, lumbosacral region: Secondary | ICD-10-CM | POA: Diagnosis not present

## 2023-10-15 DIAGNOSIS — R4182 Altered mental status, unspecified: Secondary | ICD-10-CM | POA: Diagnosis not present

## 2023-10-15 DIAGNOSIS — J9601 Acute respiratory failure with hypoxia: Secondary | ICD-10-CM | POA: Diagnosis not present

## 2023-10-15 DIAGNOSIS — F05 Delirium due to known physiological condition: Secondary | ICD-10-CM | POA: Diagnosis not present

## 2023-10-15 DIAGNOSIS — N1831 Chronic kidney disease, stage 3a: Secondary | ICD-10-CM | POA: Diagnosis present

## 2023-10-15 DIAGNOSIS — N179 Acute kidney failure, unspecified: Secondary | ICD-10-CM | POA: Diagnosis not present

## 2023-10-15 DIAGNOSIS — Z515 Encounter for palliative care: Secondary | ICD-10-CM

## 2023-10-15 DIAGNOSIS — E119 Type 2 diabetes mellitus without complications: Secondary | ICD-10-CM

## 2023-10-15 DIAGNOSIS — E538 Deficiency of other specified B group vitamins: Secondary | ICD-10-CM | POA: Diagnosis present

## 2023-10-15 DIAGNOSIS — G9341 Metabolic encephalopathy: Secondary | ICD-10-CM | POA: Diagnosis not present

## 2023-10-15 DIAGNOSIS — I129 Hypertensive chronic kidney disease with stage 1 through stage 4 chronic kidney disease, or unspecified chronic kidney disease: Secondary | ICD-10-CM | POA: Diagnosis present

## 2023-10-15 DIAGNOSIS — G8929 Other chronic pain: Secondary | ICD-10-CM | POA: Diagnosis present

## 2023-10-15 DIAGNOSIS — N200 Calculus of kidney: Secondary | ICD-10-CM | POA: Diagnosis not present

## 2023-10-15 DIAGNOSIS — Z1152 Encounter for screening for COVID-19: Secondary | ICD-10-CM

## 2023-10-15 DIAGNOSIS — N1832 Chronic kidney disease, stage 3b: Secondary | ICD-10-CM | POA: Diagnosis not present

## 2023-10-15 DIAGNOSIS — Z79899 Other long term (current) drug therapy: Secondary | ICD-10-CM

## 2023-10-15 DIAGNOSIS — R109 Unspecified abdominal pain: Secondary | ICD-10-CM | POA: Diagnosis not present

## 2023-10-15 DIAGNOSIS — K6289 Other specified diseases of anus and rectum: Secondary | ICD-10-CM | POA: Diagnosis present

## 2023-10-15 DIAGNOSIS — D849 Immunodeficiency, unspecified: Secondary | ICD-10-CM | POA: Diagnosis present

## 2023-10-15 DIAGNOSIS — J690 Pneumonitis due to inhalation of food and vomit: Secondary | ICD-10-CM | POA: Diagnosis not present

## 2023-10-15 DIAGNOSIS — E860 Dehydration: Secondary | ICD-10-CM | POA: Diagnosis present

## 2023-10-15 DIAGNOSIS — A419 Sepsis, unspecified organism: Secondary | ICD-10-CM | POA: Diagnosis not present

## 2023-10-15 DIAGNOSIS — M5459 Other low back pain: Secondary | ICD-10-CM | POA: Diagnosis not present

## 2023-10-15 DIAGNOSIS — Z8619 Personal history of other infectious and parasitic diseases: Secondary | ICD-10-CM

## 2023-10-15 DIAGNOSIS — K5730 Diverticulosis of large intestine without perforation or abscess without bleeding: Secondary | ICD-10-CM | POA: Diagnosis not present

## 2023-10-15 DIAGNOSIS — Z7984 Long term (current) use of oral hypoglycemic drugs: Secondary | ICD-10-CM

## 2023-10-15 DIAGNOSIS — R451 Restlessness and agitation: Secondary | ICD-10-CM | POA: Diagnosis not present

## 2023-10-15 DIAGNOSIS — R140 Abdominal distension (gaseous): Secondary | ICD-10-CM | POA: Diagnosis not present

## 2023-10-15 DIAGNOSIS — Z7189 Other specified counseling: Secondary | ICD-10-CM

## 2023-10-15 DIAGNOSIS — M47816 Spondylosis without myelopathy or radiculopathy, lumbar region: Secondary | ICD-10-CM | POA: Diagnosis not present

## 2023-10-15 DIAGNOSIS — Z808 Family history of malignant neoplasm of other organs or systems: Secondary | ICD-10-CM

## 2023-10-15 DIAGNOSIS — R432 Parageusia: Secondary | ICD-10-CM | POA: Diagnosis present

## 2023-10-15 DIAGNOSIS — M5441 Lumbago with sciatica, right side: Secondary | ICD-10-CM

## 2023-10-15 DIAGNOSIS — Z4682 Encounter for fitting and adjustment of non-vascular catheter: Secondary | ICD-10-CM | POA: Diagnosis not present

## 2023-10-15 DIAGNOSIS — E861 Hypovolemia: Secondary | ICD-10-CM | POA: Diagnosis present

## 2023-10-15 DIAGNOSIS — Z87440 Personal history of urinary (tract) infections: Secondary | ICD-10-CM

## 2023-10-15 DIAGNOSIS — M51369 Other intervertebral disc degeneration, lumbar region without mention of lumbar back pain or lower extremity pain: Secondary | ICD-10-CM | POA: Diagnosis not present

## 2023-10-15 DIAGNOSIS — Z833 Family history of diabetes mellitus: Secondary | ICD-10-CM

## 2023-10-15 LAB — COMPREHENSIVE METABOLIC PANEL
ALT: 10 U/L (ref 0–44)
AST: 23 U/L (ref 15–41)
Albumin: 3.3 g/dL — ABNORMAL LOW (ref 3.5–5.0)
Alkaline Phosphatase: 57 U/L (ref 38–126)
Anion gap: 12 (ref 5–15)
BUN: 12 mg/dL (ref 8–23)
CO2: 20 mmol/L — ABNORMAL LOW (ref 22–32)
Calcium: 9.6 mg/dL (ref 8.9–10.3)
Chloride: 102 mmol/L (ref 98–111)
Creatinine, Ser: 1.06 mg/dL — ABNORMAL HIGH (ref 0.44–1.00)
GFR, Estimated: 56 mL/min — ABNORMAL LOW (ref >60)
Glucose, Bld: 80 mg/dL (ref 70–99)
Potassium: 3.3 mmol/L — ABNORMAL LOW (ref 3.5–5.1)
Sodium: 134 mmol/L — ABNORMAL LOW (ref 135–145)
Total Bilirubin: 1.1 mg/dL (ref 0.0–1.2)
Total Protein: 6 g/dL — ABNORMAL LOW (ref 6.5–8.1)

## 2023-10-15 LAB — URINALYSIS, ROUTINE W REFLEX MICROSCOPIC
Bacteria, UA: NONE SEEN
Bilirubin Urine: NEGATIVE
Glucose, UA: NEGATIVE mg/dL
Hgb urine dipstick: NEGATIVE
Ketones, ur: 5 mg/dL — AB
Nitrite: NEGATIVE
Protein, ur: NEGATIVE mg/dL
Specific Gravity, Urine: 1.027 (ref 1.005–1.030)
pH: 5 (ref 5.0–8.0)

## 2023-10-15 LAB — RESP PANEL BY RT-PCR (RSV, FLU A&B, COVID)  RVPGX2
Influenza A by PCR: NEGATIVE
Influenza B by PCR: NEGATIVE
Resp Syncytial Virus by PCR: NEGATIVE
SARS Coronavirus 2 by RT PCR: NEGATIVE

## 2023-10-15 LAB — CBC WITH DIFFERENTIAL/PLATELET
Abs Immature Granulocytes: 0.02 10*3/uL (ref 0.00–0.07)
Basophils Absolute: 0 10*3/uL (ref 0.0–0.1)
Basophils Relative: 0 %
Eosinophils Absolute: 0.3 10*3/uL (ref 0.0–0.5)
Eosinophils Relative: 6 %
HCT: 32.3 % — ABNORMAL LOW (ref 36.0–46.0)
Hemoglobin: 10.6 g/dL — ABNORMAL LOW (ref 12.0–15.0)
Immature Granulocytes: 1 %
Lymphocytes Relative: 30 %
Lymphs Abs: 1.3 10*3/uL (ref 0.7–4.0)
MCH: 32.9 pg (ref 26.0–34.0)
MCHC: 32.8 g/dL (ref 30.0–36.0)
MCV: 100.3 fL — ABNORMAL HIGH (ref 80.0–100.0)
Monocytes Absolute: 0.5 10*3/uL (ref 0.1–1.0)
Monocytes Relative: 12 %
Neutro Abs: 2.3 10*3/uL (ref 1.7–7.7)
Neutrophils Relative %: 51 %
Platelets: 179 10*3/uL (ref 150–400)
RBC: 3.22 MIL/uL — ABNORMAL LOW (ref 3.87–5.11)
RDW: 14.6 % (ref 11.5–15.5)
WBC: 4.4 10*3/uL (ref 4.0–10.5)
nRBC: 0 % (ref 0.0–0.2)

## 2023-10-15 LAB — LIPASE, BLOOD: Lipase: 34 U/L (ref 11–51)

## 2023-10-15 LAB — MAGNESIUM: Magnesium: 1.4 mg/dL — ABNORMAL LOW (ref 1.7–2.4)

## 2023-10-15 MED ORDER — SENNOSIDES-DOCUSATE SODIUM 8.6-50 MG PO TABS: 1.0000 | ORAL_TABLET | Freq: Every evening | ORAL | Status: DC | PRN

## 2023-10-15 MED ORDER — ONDANSETRON HCL 4 MG PO TABS: 4.0000 mg | ORAL_TABLET | Freq: Four times a day (QID) | ORAL | Status: DC | PRN

## 2023-10-15 MED ORDER — METOCLOPRAMIDE HCL 5 MG/ML IJ SOLN
10.0000 mg | Freq: Three times a day (TID) | INTRAMUSCULAR | Status: DC
Start: 2023-10-15 — End: 2023-10-22
  Administered 2023-10-15 – 2023-10-22 (×20): 10 mg via INTRAVENOUS
  Filled 2023-10-15 (×20): qty 2

## 2023-10-15 MED ORDER — IOHEXOL 300 MG/ML  SOLN
100.0000 mL | Freq: Once | INTRAMUSCULAR | Status: AC | PRN
  Administered 2023-10-15: 100 mL via INTRAVENOUS

## 2023-10-15 MED ORDER — ACETAMINOPHEN 325 MG PO TABS: 650.0000 mg | ORAL_TABLET | Freq: Four times a day (QID) | ORAL | Status: DC | PRN

## 2023-10-15 MED ORDER — ROSUVASTATIN CALCIUM 20 MG PO TABS
40.0000 mg | ORAL_TABLET | Freq: Every day | ORAL | Status: DC
Start: 2023-10-16 — End: 2023-10-20
  Administered 2023-10-16 – 2023-10-17 (×2): 40 mg via ORAL
  Filled 2023-10-15 (×3): qty 2

## 2023-10-15 MED ORDER — ENOXAPARIN SODIUM 40 MG/0.4ML IJ SOSY
40.0000 mg | PREFILLED_SYRINGE | INTRAMUSCULAR | Status: DC
  Administered 2023-10-15 – 2023-10-22 (×7): 40 mg via SUBCUTANEOUS
  Filled 2023-10-15 (×7): qty 0.4

## 2023-10-15 MED ORDER — ACETAMINOPHEN 650 MG RE SUPP
650.0000 mg | Freq: Four times a day (QID) | RECTAL | Status: DC | PRN
  Administered 2023-10-19 (×2): 650 mg via RECTAL
  Filled 2023-10-15 (×2): qty 1

## 2023-10-15 MED ORDER — SODIUM CHLORIDE 0.9 % IV SOLN: 8.0000 mg | Freq: Three times a day (TID) | INTRAVENOUS | Status: DC | PRN

## 2023-10-15 MED ORDER — MORPHINE SULFATE (PF) 2 MG/ML IV SOLN
1.0000 mg | INTRAVENOUS | Status: DC | PRN
  Administered 2023-10-15 – 2023-10-17 (×3): 1 mg via INTRAVENOUS
  Filled 2023-10-15 (×3): qty 1

## 2023-10-15 MED ORDER — LEVOTHYROXINE SODIUM 50 MCG PO TABS
50.0000 ug | ORAL_TABLET | Freq: Every day | ORAL | Status: DC
  Administered 2023-10-16 – 2023-10-18 (×3): 50 ug via ORAL
  Filled 2023-10-15 (×3): qty 1

## 2023-10-15 MED ORDER — SODIUM CHLORIDE 0.9 % IV SOLN: INTRAVENOUS | Status: DC

## 2023-10-15 MED ORDER — ONDANSETRON HCL 4 MG/2ML IJ SOLN
4.0000 mg | Freq: Once | INTRAMUSCULAR | Status: AC
  Administered 2023-10-15: 4 mg via INTRAVENOUS
  Filled 2023-10-15: qty 2

## 2023-10-15 MED ORDER — POTASSIUM CHLORIDE 2 MEQ/ML IV SOLN
INTRAVENOUS | Status: AC
  Filled 2023-10-15 (×2): qty 1000

## 2023-10-15 MED ORDER — POTASSIUM CHLORIDE 10 MEQ/100ML IV SOLN
10.0000 meq | Freq: Once | INTRAVENOUS | Status: AC
  Administered 2023-10-15: 10 meq via INTRAVENOUS
  Filled 2023-10-15: qty 100

## 2023-10-15 MED ORDER — ONDANSETRON HCL 4 MG/2ML IJ SOLN
4.0000 mg | Freq: Four times a day (QID) | INTRAMUSCULAR | Status: DC | PRN
  Administered 2023-10-19 – 2023-10-20 (×2): 4 mg via INTRAVENOUS
  Filled 2023-10-15 (×2): qty 2

## 2023-10-15 MED ORDER — HYDROCODONE-ACETAMINOPHEN 5-325 MG PO TABS
1.0000 | ORAL_TABLET | ORAL | Status: DC | PRN
  Administered 2023-10-16 (×3): 2 via ORAL
  Administered 2023-10-17: 1 via ORAL
  Administered 2023-10-17 – 2023-10-18 (×4): 2 via ORAL
  Filled 2023-10-15: qty 2
  Filled 2023-10-15: qty 1
  Filled 2023-10-15 (×6): qty 2

## 2023-10-15 MED ORDER — SODIUM CHLORIDE 0.9% FLUSH
3.0000 mL | Freq: Two times a day (BID) | INTRAVENOUS | Status: DC
  Administered 2023-10-15 – 2023-10-17 (×4): 3 mL via INTRAVENOUS

## 2023-10-15 MED ORDER — LACTATED RINGERS IV BOLUS
1000.0000 mL | Freq: Once | INTRAVENOUS | Status: AC
Start: 2023-10-15 — End: 2023-10-15
  Administered 2023-10-15: 1000 mL via INTRAVENOUS

## 2023-10-15 MED ORDER — MORPHINE SULFATE 2 MG/ML IJ SOLN
2.0000 mg | INTRAMUSCULAR | Status: DC | PRN
  Administered 2023-10-15 (×2): 2 mg via INTRAVENOUS
  Filled 2023-10-15 (×2): qty 1

## 2023-10-15 NOTE — Telephone Encounter (Signed)
Called back and given below message to husband. He verbalized understanding and will take Meeya to [facility] ER this am.

## 2023-10-15 NOTE — ED Notes (Signed)
 Provider at bedside

## 2023-10-15 NOTE — Progress Notes (Signed)
MILICA GULLY   DOB:1951/09/05   GN#:[id number]    ASSESSMENT & PLAN:  Cervical cancer Recent imaging studies show excellent response to therapy She is currently receiving maintenance bevacizumab and that should not be the cause of her uncontrolled nausea and vomiting Continue supportive care  Recurrent nausea vomiting, dehydration and inability to keep food down Causes unknown The patient have history of C. difficile We need to exclude causes such as bowel obstruction CT imaging is pending Recommend IV fluids and antiemetics  Chronic kidney disease stage III Creatinine is stable Hydration as tolerated  Anemia Multifactorial Observe only  Discharge planning She is not able to keep food down with conservative approach I recommend admission to the hospital for further management  All questions were answered. The patient knows to call the clinic with any problems, questions or concerns.   The total time spent in the appointment was 40 minutes encounter with patients including review of chart and various tests results, discussions about plan of care and coordination of care plan  Artis Delay, MD 10/15/2023 3:33 PM  Subjective:  Patient is well-known to me.  I just saw her 2 weeks ago in the outpatient clinic She had CT imaging evaluation which show excellent response to therapy.  She is on maintenance bevacizumab She has not been feeling well since last week with poor appetite, nausea and vomiting She went to the emergency department end of last week but was not admitted Over the weekend, she felt worse and was not able to keep anything down She was redirected back to the emergency department for further evaluation Labs are available for review CT imaging is pending She has low back pain  Objective:  Vitals:   10/15/23 1418 10/15/23 1522  BP:  131/68  Pulse:  95  Resp: 20 (!) 21  Temp:    SpO2:  98%    No intake or output data in the 24 hours ending 10/15/23  1533  GENERAL:alert, no distress and comfortable SKIN: skin color, texture, turgor are normal, no rashes or significant lesions EYES: normal, Conjunctiva are pink and non-injected, sclera clear OROPHARYNX:no exudate, no erythema and lips, buccal mucosa, and tongue normal  NECK: supple, thyroid normal size, non-tender, without nodularity LYMPH:  no palpable lymphadenopathy in the cervical, axillary or inguinal LUNGS: clear to auscultation and percussion with normal breathing effort HEART: regular rate & rhythm and no murmurs and no lower extremity edema ABDOMEN:abdomen soft, non-tender and normal bowel sounds Musculoskeletal:no cyanosis of digits and no clubbing  NEURO: alert & oriented x 3 with fluent speech, no focal motor/sensory deficits   Labs:  Recent Labs    10/04/23 1409 10/12/23 1828 10/15/23 1358  NA 135 137 134*  K 3.9 3.7 3.3*  CL 102 103 102  CO2 27 25 20*  GLUCOSE 64* 113* 80  BUN 17 11 12   CREATININE 1.27* 1.11* 1.06*  CALCIUM 10.3 9.9 9.6  GFRNONAA 45* 53* 56*  PROT 6.2* 6.1* 6.0*  ALBUMIN 3.4* 3.2* 3.3*  AST 20 22 23   ALT 7 8 10   ALKPHOS 73 70 57  BILITOT 0.5 0.8 1.1    Studies:  CT Head Wo Contrast Result Date: 10/12/2023 CLINICAL DATA:  Vertigo, central dizziness, nausea, 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 RADIATION DOSE REDUCTION: This exam was performed according to the departmental dose-optimization program  which includes automated exposure control, adjustment of the mA and/or kV according to patient size and/or use of iterative reconstruction technique. COMPARISON:  MRI head June 20, 2007. FINDINGS: Brain: No evidence of acute infarction, hemorrhage, hydrocephalus, extra-axial collection or mass lesion/mass effect. Partially empty sella. Vascular: Calcific atherosclerosis. Skull: No  acute fracture. Sinuses/Orbits: Clear sinuses.  No acute orbital findings. Other: No sizable mastoid effusions. IMPRESSION: No evidence of acute intracranial abnormality. Electronically Signed   By: Feliberto Harts M.D.   On: 10/12/2023 22:45   CT CHEST ABDOMEN PELVIS W CONTRAST Result Date: 09/29/2023 CLINICAL DATA:  Metastatic cervical cancer, assess treatment response, chemotherapy and XRT complete * Tracking Code: BO * EXAM: CT CHEST, ABDOMEN, AND PELVIS WITH CONTRAST TECHNIQUE: Multidetector CT imaging of the chest, abdomen and pelvis was performed following the standard protocol during bolus administration of intravenous contrast. RADIATION DOSE REDUCTION: This exam was performed according to the departmental dose-optimization program which includes automated exposure control, adjustment of the mA and/or kV according to patient size and/or use of iterative reconstruction technique. CONTRAST:  OMNIPAQUE IOHEXOL 300 MG/ML  SOLN COMPARISON:  CT chest abdomen pelvis, 05/17/2023 FINDINGS: CT CHEST FINDINGS Cardiovascular: Right chest port catheter. Aortic atherosclerosis. Normal heart size. Left and right coronary artery calcifications. No pericardial effusion. Mediastinum/Nodes: No enlarged mediastinal, hilar, or axillary lymph nodes. Small hiatal hernia. Small benign calcified left hilar lymph nodes. Thyroid gland, trachea, and esophagus demonstrate no significant findings. Lungs/Pleura: Interval enlargement of small adjacent nodules in the dependent left lower lobe measuring 0.3 cm, previously no greater than 0.1 cm (series 6, image 19). Otherwise unchanged small pulmonary nodules, for example measuring 0.3 cm in the peripheral right upper lobe (series 6, image 38), additional nodule in the superior segment right lower lobe measuring 0.4 cm (series 6, image 83). No pleural effusion or pneumothorax. Musculoskeletal: No chest wall abnormality. No acute osseous findings. CT ABDOMEN PELVIS FINDINGS  Hepatobiliary: No solid liver abnormality is seen. No gallstones, gallbladder wall thickening, or biliary dilatation. Pancreas: Unremarkable. No pancreatic ductal dilatation or surrounding inflammatory changes. Spleen: Normal in size without significant abnormality. Punctuate granulomatous calcifications of the spleen. Adrenals/Urinary Tract: Adrenal glands are unremarkable. Small nonobstructive right renal calculi. No left-sided calculi, ureteral calculi, or hydronephrosis. Bladder is unremarkable. Stomach/Bowel: Stomach is within normal limits. Appendix appears normal. No evidence of bowel wall thickening, distention, or inflammatory changes. Sigmoid diverticulosis. Vascular/Lymphatic: No significant vascular findings are present. No enlarged abdominal or pelvic lymph nodes. Reproductive: Status post hysterectomy. Unchanged post treatment/post radiation appearance of the pelvis with presacral fat stranding (series 2, image 47). Other: No abdominal wall hernia or abnormality. No ascites. Musculoskeletal: No acute osseous findings. IMPRESSION: 1. Interval enlargement of small adjacent nodules in the dependent left lower lobe measuring 0.3 cm, previously no greater than 0.1 cm. These are nonspecific and may be infectious or inflammatory, however suspicious for tiny enlarging metastases. Attention on follow-up warranted. 2. Otherwise unchanged small pulmonary nodules. 3. Status post hysterectomy with unchanged post treatment/post radiation appearance of the pelvis with presacral fat stranding. 4. No evidence of lymphadenopathy or metastatic disease in the abdomen or pelvis. 5. Nonobstructive right nephrolithiasis. 6. Coronary artery disease. Aortic Atherosclerosis (ICD10-I70.0). Electronically Signed   By: Jearld Lesch M.D.   On: 09/29/2023 22:16

## 2023-10-15 NOTE — ED Provider Notes (Signed)
[place] EMERGENCY DEPARTMENT AT [facility] Provider Note   CSN: [id number] Arrival date & time: 10/15/23  1101     History  Chief Complaint  Patient presents with   Nausea    Misty Deleon is a 73 y.o. female.  HPI   Pt has been having trouble with vomiting a.  Pt has not been ale to keep anything down.  She has been feeling weak.  Patient has been losing weight.  Anytime she tries to eat or drink she vomits.  Patient has been hiccuping a lot Pt has history of c diff.  Vomiting 2-3 times per day.  Diarrhea not every day but sometimes a few times per day but that has been since the c diff she had several months ago Patient was seen in the emergency room at another facility on January 31.  She was evaluated for nausea and vomiting at that time.  Patient has noted that certain smells and odors make her symptoms worse.  She has also had decreased taste in her mouth.  Patient ED workup was reassuring.  She was discharged home with an alternative antinausea medication but it has not helped Patient called her oncologist today and was instructed to come to the ED for evaluation Unrelated to this patient has also been having trouble with back pain.  She had an appointment with her back doctor today Home Medications Prior to Admission medications   Medication Sig Start Date End Date Taking? Authorizing Provider  antiseptic oral rinse (BIOTENE) LIQD 15 mLs by Mouth Rinse route as needed for dry mouth. 07/11/23   Marinda Elk, MD  atenolol (TENORMIN) 100 MG tablet Take 1 tablet (100 mg total) by mouth daily. 07/18/23   Artis Delay, MD  cholecalciferol (VITAMIN D3) 25 MCG (1000 UNIT) tablet Take 2,000 Units by mouth daily.    [provider]  diphenhydrAMINE (BENADRYL ALLERGY) 25 MG tablet Take 2 tablets (50 mg total) by mouth every 6 (six) hours as needed for allergies. 01/05/23   Simonne Martinet, NP  ezetimibe (ZETIA) 10 MG tablet Take 10 mg by mouth in the  morning.    [provider]  glimepiride (AMARYL) 1 MG tablet Take 1 mg by mouth daily with breakfast. 07/07/22   [provider]  levothyroxine (SYNTHROID) 50 MCG tablet TAKE 1 TABLET(50 MCG) BY MOUTH DAILY BEFORE BREAKFAST 10/09/23   Artis Delay, MD  lidocaine-prilocaine (EMLA) cream Apply 1 Application topically as needed (for port access). 06/12/23   Artis Delay, MD  ondansetron (ZOFRAN) 8 MG tablet TAKE 1 TABLET(8 MG) BY MOUTH EVERY 8 HOURS AS NEEDED FOR NAUSEA OR VOMITING. START ON THE THIRD DAY AFTER CHEMOTHERAPY 09/26/23   Artis Delay, MD  prochlorperazine (COMPAZINE) 10 MG tablet Take 1 tablet (10 mg total) by mouth every 6 (six) hours as needed for nausea or vomiting. 09/01/22   Artis Delay, MD  promethazine (PHENERGAN) 12.5 MG tablet Take 0.5 tablets (6.25 mg total) by mouth every 6 (six) hours as needed for nausea or vomiting. 10/12/23   Smitty Knudsen, PA-C  rosuvastatin (CRESTOR) 40 MG tablet Take 40 mg by mouth at bedtime.    [provider]  sitaGLIPtin (JANUVIA) 100 MG tablet Take 50 mg by mouth in the morning. 03/30/22   [provider]  traMADol (ULTRAM) 50 MG tablet Take 2 tablets (100 mg total) by mouth every 6 (six) hours as needed. Patient taking differently: Take 100 mg by mouth every 6 (six) hours  as needed for moderate pain (pain score 4-6). 07/03/23   Artis Delay, MD  TYLENOL 500 MG tablet Take 500-1,000 mg by mouth every 6 (six) hours as needed for mild pain or headache.    [provider]  vitamin B-12 (CYANOCOBALAMIN) 1000 MCG tablet Take 1,000 mcg by mouth in the morning. Patient not taking: Reported on 07/13/2023    [provider]      Allergies    Lisinopril    Review of Systems   Review of Systems  Physical Exam Updated Vital Signs BP 102/79 (BP Location: Right Arm)   Pulse (!) 106   Temp 98 F (36.7 C) (Oral)   Resp 16   Ht 1.549 m (5\' 1" )   Wt 83.5 kg   SpO2 100%   BMI 34.77 kg/m  Physical  Exam Vitals and nursing note reviewed.  Constitutional:      Appearance: She is well-developed. She is ill-appearing.  HENT:     Head: Normocephalic and atraumatic.     Right Ear: External ear normal.     Left Ear: External ear normal.  Eyes:     General: No scleral icterus.       Right eye: No discharge.        Left eye: No discharge.     Conjunctiva/sclera: Conjunctivae normal.  Neck:     Trachea: No tracheal deviation.  Cardiovascular:     Rate and Rhythm: Normal rate and regular rhythm.  Pulmonary:     Effort: Pulmonary effort is normal. No respiratory distress.     Breath sounds: Normal breath sounds. No stridor. No wheezing or rales.  Abdominal:     General: Bowel sounds are normal. There is no distension.     Palpations: Abdomen is soft.     Tenderness: There is no abdominal tenderness. There is no guarding or rebound.  Musculoskeletal:        General: No tenderness or deformity.     Cervical back: Neck supple.  Skin:    General: Skin is warm and dry.     Findings: No rash.  Neurological:     General: No focal deficit present.     Mental Status: She is alert.     Cranial Nerves: No cranial nerve deficit, dysarthria or facial asymmetry.     Sensory: No sensory deficit.     Motor: No abnormal muscle tone or seizure activity.     Coordination: Coordination normal.  Psychiatric:        Mood and Affect: Mood normal.     ED Results / Procedures / Treatments   Labs (all labs ordered are listed, but only abnormal results are displayed) Labs Reviewed  COMPREHENSIVE METABOLIC PANEL - Abnormal; Notable for the following components:      Result Value   Sodium 134 (*)    Potassium 3.3 (*)    CO2 20 (*)    Creatinine, Ser 1.06 (*)    Total Protein 6.0 (*)    Albumin 3.3 (*)    GFR, Estimated 56 (*)    All other components within normal limits  CBC WITH DIFFERENTIAL/PLATELET - Abnormal; Notable for the following components:   RBC 3.22 (*)    Hemoglobin 10.6 (*)     HCT 32.3 (*)    MCV 100.3 (*)    All other components within normal limits  URINALYSIS, ROUTINE W REFLEX MICROSCOPIC - Abnormal; Notable for the following components:   APPearance HAZY (*)    Ketones, ur 5 (*)  Leukocytes,Ua SMALL (*)    All other components within normal limits  GASTROINTESTINAL PANEL BY PCR, STOOL (REPLACES STOOL CULTURE)  C DIFFICILE QUICK SCREEN W PCR REFLEX    LIPASE, BLOOD    EKG None  Radiology No results found.  Procedures Procedures    Medications Ordered in ED Medications - No data to display  ED Course/ Medical Decision Making/ A&P Clinical Course as of 10/15/23 1648  Mon Oct 15, 2023  1432 CBC with Diff(!) CBC shows normal white count, stable hemoglobin [JK]    Clinical Course User Index [JK] Linwood Dibbles, MD                                 Medical Decision Making Problems Addressed: Hypokalemia: acute illness or injury that poses a threat to life or bodily functions Nausea and vomiting, unspecified vomiting type: acute illness or injury that poses a threat to life or bodily functions  Amount and/or Complexity of Data Reviewed Labs: ordered. Decision-making details documented in ED Course. Radiology: ordered.  Risk Prescription drug management. Decision regarding hospitalization.   Pt with cervical ca.  Pt with vomiting, diarrhea.  Increasing weakness.  Decreased po intake.  Seen in the ED a few days ago.  Not feeling better.  Spoke with her oncologist who recommended admission to the hospital.  Labs with decreased biarb, mild hypo k, hypo na.  .  Hgb stable.  CT scan pending.  Anticipate admission to hospital for sx control.  Care turned over to Dr Zonia Kief at shift change.        Final Clinical Impression(s) / ED Diagnoses Final diagnoses:  None    Rx / DC Orders ED Discharge Orders     None         Linwood Dibbles, MD 10/15/23 1843

## 2023-10-15 NOTE — Telephone Encounter (Signed)
-----   Message from Misty Deleon sent at 10/15/2023  8:40 AM EST ----- She was sent home from ER Can you call and ask how she is doing/

## 2023-10-15 NOTE — H&P (Addendum)
History and Physical    Misty Deleon:[id number] DOB: 26-Mar-1951 DOA: 10/15/2023  PCP: Marletta Lor, NP  Patient coming from: Home  I have personally briefly reviewed patient's old medical records in [facility]  Chief Complaint: Nausea and vomiting  HPI: Misty Deleon is a 73 y.o. female with medical history significant for cervical cancer on maintenance bevacizumab, T2DM, CKD stage IIIa, HTN, HLD, hypothyroidism, anemia due to B12 deficiency, recent C. difficile infection who presented to the ED for evaluation of persistent nausea, vomiting, and diarrhea.  Patient was previously admitted late October 2024 with acute pyelonephritis treated with cephalosporins.  She was readmitted shortly after at the beginning of November for C. difficile diarrhea.  She was treated with oral vancomycin.  Patient states that since then she has not really completely recovered to her previous baseline.  She has been having progressive issues with poor oral intake related to significant nausea and vomiting anytime she tries to eat or drink food or liquids.  She has significant intolerance to smells.  She says her taste has been abnormal.  All foods taste like metal or sour.  She becomes easily nauseous when smelling foods and this can trigger episodes of emesis and retching.  Because of this she has not really been able to eat very much over the last couple months.  She has also had some recent episodes of diarrhea.  She does not report any pain with swallowing or sensation of food/liquids getting stuck in her throat or chest.  She has been feeling significantly weak.  She normally ambulates with the use of a walker but has become so weak she is not moving much on her own at all.  She reports good urine output without dysuria.  She denies rectal pain.  She was seen in the [facility] ED on 1/31 for her symptoms.  She was prescribed Phenergan which had no effect.  She has been experiencing low back pain and  today went to the [facility] office.  An x-ray was obtained and showed an L5 vertebral body compression fracture.  ED Course  Labs/Imaging on admission: I have personally reviewed following labs and imaging studies.  Initial vitals showed BP 102/79, pulse 106, RR 16, temp 98.0 F, SpO2 100% on room air.  Labs show WBC 4.4, hemoglobin 10.6, platelets 179,000, sodium 134, potassium 3.3, bicarb 20, BUN 12, creatinine 1.06, serum glucose 80, LFTs within normal limits, lipase 34.  UA negative for UTI.  C. difficile and GI pathogen panels pending collection.  CT abdomen/pelvis with contrast showed mild fat stranding surrounding the bladder, mild rectal wall thickening worrisome for proctitis.  Nonobstructing right renal calculus, sigmoid colon diverticulosis, stable presacral and perirectal edema.  Patient was given 1 L LR, IV K 10 mEq x 1, IV morphine, Zofran.  She was evaluated by her oncologist Dr. Bertis Ruddy recommended medical admission for continued supportive care.  The hospitalist service was consulted to admit for further evaluation and management.  Review of Systems: All systems reviewed and are negative except as documented in history of present illness above.   Past Medical History:  Diagnosis Date   CKD (chronic kidney disease), stage III (HCC)    Deficiency anemia    B12 def.  treated with b12 injection and has received iron infusion's   GERD (gastroesophageal reflux disease)    History of radiation therapy 09/22/20-11/01/20   Cervix, IMRT    Dr. Antony Blackbird   History of radiation therapy 11/09/2020-12/08/2020  cervix, intracavitary brachytherapy   Dr Antony Blackbird   Hypercholesteremia    Hypertension    Hypomagnesemia    Malignant neoplasm cervix [facility]) oncologist--- dr gorsuch/  radiation oncology--- dr Roselind Messier   dx 12/ 2021,  Stage IIIC-1,  with pelvic adeopathy;  concurrent chemo/ radiation;  chemo started 09-20-2020 and pt completed IMRT 11-01-2020 / scheduled for high dose  brachytherapy to start 11-09-2020   PCOS (polycystic ovarian syndrome)    PMB (postmenopausal bleeding)    Type 2 diabetes mellitus (HCC)    followed by pc   (11-04-2020 pt stated does not check blood sugar)   Wears contact lenses     Past Surgical History:  Procedure Laterality Date   CESAREAN SECTION  x2 last one 1977   IR IMAGING GUIDED PORT INSERTION  09/16/2020   OPERATIVE ULTRASOUND N/A 11/09/2020   Procedure: OPERATIVE ULTRASOUND;  Surgeon: Antony Blackbird, MD;  Location: [facility];  Service: Urology;  Laterality: N/A;   OPERATIVE ULTRASOUND N/A 11/16/2020   Procedure: OPERATIVE ULTRASOUND;  Surgeon: Antony Blackbird, MD;  Location: [facility];  Service: Urology;  Laterality: N/A;   OPERATIVE ULTRASOUND N/A 11/25/2020   Procedure: OPERATIVE ULTRASOUND;  Surgeon: Antony Blackbird, MD;  Location: [facility];  Service: Urology;  Laterality: N/A;   OPERATIVE ULTRASOUND N/A 11/29/2020   Procedure: OPERATIVE ULTRASOUND;  Surgeon: Antony Blackbird, MD;  Location: [facility];  Service: Urology;  Laterality: N/A;   OPERATIVE ULTRASOUND N/A 12/08/2020   Procedure: OPERATIVE ULTRASOUND;  Surgeon: Antony Blackbird, MD;  Location: [facility];  Service: Urology;  Laterality: N/A;   TANDEM RING INSERTION N/A 11/09/2020   Procedure: TANDEM RING INSERTION;  Surgeon: Antony Blackbird, MD;  Location: [facility];  Service: Urology;  Laterality: N/A;   TANDEM RING INSERTION N/A 11/16/2020   Procedure: TANDEM RING INSERTION;  Surgeon: Antony Blackbird, MD;  Location: [facility];  Service: Urology;  Laterality: N/A;   TANDEM RING INSERTION N/A 11/25/2020   Procedure: TANDEM RING INSERTION;  Surgeon: Antony Blackbird, MD;  Location: [facility];  Service: Urology;  Laterality: N/A;   TANDEM RING INSERTION N/A 11/29/2020   Procedure: TANDEM RING INSERTION;  Surgeon: Antony Blackbird, MD;  Location: [facility];  Service: Urology;  Laterality: N/A;   TANDEM RING INSERTION N/A 12/08/2020   Procedure: TANDEM RING INSERTION;  Surgeon: Antony Blackbird, MD;  Location: [facility];  Service: Urology;  Laterality: N/A;   WISDOM TOOTH EXTRACTION  1980s    Social History:  reports that she has never smoked. She has never used smokeless tobacco. She reports that she does not drink alcohol and does not use drugs.  Allergies  Allergen Reactions   Lisinopril Swelling and Other (See Comments)    Entire tongue became swollen and the exterior front of the throat. No shortness of breath noted.    Family History  Problem Relation Age of Onset   Diabetes Father    Brain cancer Son    Breast cancer Neg Hx    Colon cancer Neg Hx    Ovarian cancer Neg Hx    Endometrial cancer Neg Hx    Prostate cancer Neg Hx    Pancreatic cancer Neg Hx      Prior to Admission medications   Medication Sig Start Date End Date Taking? Authorizing Provider  antiseptic oral rinse (BIOTENE) LIQD 15 mLs by Mouth Rinse route as needed for dry mouth.  07/11/23   Marinda Elk, MD  atenolol (TENORMIN) 100 MG tablet Take 1 tablet (100 mg total) by mouth daily. 07/18/23   Artis Delay, MD  cholecalciferol (VITAMIN D3) 25 MCG (1000 UNIT) tablet Take 2,000 Units by mouth daily.    [provider]  diphenhydrAMINE (BENADRYL ALLERGY) 25 MG tablet Take 2 tablets (50 mg total) by mouth every 6 (six) hours as needed for allergies. 01/05/23   Simonne Martinet, NP  ezetimibe (ZETIA) 10 MG tablet Take 10 mg by mouth in the morning.    [provider]  glimepiride (AMARYL) 1 MG tablet Take 1 mg by mouth daily with breakfast. 07/07/22   [provider]  levothyroxine (SYNTHROID) 50 MCG tablet TAKE 1 TABLET(50 MCG) BY MOUTH DAILY BEFORE BREAKFAST 10/09/23   Artis Delay, MD  lidocaine-prilocaine (EMLA) cream Apply 1 Application topically as needed (for port access). 06/12/23   Artis Delay, MD   ondansetron (ZOFRAN) 8 MG tablet TAKE 1 TABLET(8 MG) BY MOUTH EVERY 8 HOURS AS NEEDED FOR NAUSEA OR VOMITING. START ON THE THIRD DAY AFTER CHEMOTHERAPY 09/26/23   Artis Delay, MD  prochlorperazine (COMPAZINE) 10 MG tablet Take 1 tablet (10 mg total) by mouth every 6 (six) hours as needed for nausea or vomiting. 09/01/22   Artis Delay, MD  promethazine (PHENERGAN) 12.5 MG tablet Take 0.5 tablets (6.25 mg total) by mouth every 6 (six) hours as needed for nausea or vomiting. 10/12/23   Smitty Knudsen, PA-C  rosuvastatin (CRESTOR) 40 MG tablet Take 40 mg by mouth at bedtime.    [provider]  sitaGLIPtin (JANUVIA) 100 MG tablet Take 50 mg by mouth in the morning. 03/30/22   [provider]  traMADol (ULTRAM) 50 MG tablet Take 2 tablets (100 mg total) by mouth every 6 (six) hours as needed. Patient taking differently: Take 100 mg by mouth every 6 (six) hours as needed for moderate pain (pain score 4-6). 07/03/23   Artis Delay, MD  TYLENOL 500 MG tablet Take 500-1,000 mg by mouth every 6 (six) hours as needed for mild pain or headache.    [provider]  vitamin B-12 (CYANOCOBALAMIN) 1000 MCG tablet Take 1,000 mcg by mouth in the morning. Patient not taking: Reported on 07/13/2023    [provider]    Physical Exam: Vitals:   10/15/23 1418 10/15/23 1522 10/15/23 1815 10/15/23 1924  BP:  131/68 128/73   Pulse:  95 96   Resp: 20 (!) 21 (!) 23   Temp:  98 F (36.7 C)  97.9 F (36.6 C)  TempSrc:    Oral  SpO2:  98% 100%   Weight:      Height:       Constitutional: Resting supine in bed, NAD, calm, comfortable Eyes: EOMI, lids and conjunctivae normal ENMT: Mucous membranes are dry. Posterior pharynx clear of any exudate or lesions.Normal dentition.  Neck: normal, supple, no masses. Respiratory: clear to auscultation bilaterally, no wheezing, no crackles. Normal respiratory effort. No accessory muscle use.  Cardiovascular: Regular rate and rhythm, no murmurs  / rubs / gallops. No extremity edema. 2+ pedal pulses. Abdomen: no tenderness, no masses palpated.  Musculoskeletal: no clubbing / cyanosis. No joint deformity upper and lower extremities. ROM is limited with sitting up and bending due to low back pain.  Normal muscle tone.  Skin: no rashes, lesions, ulcers. No induration Neurologic: Sensation intact. Strength 5/5 in all 4.  Psychiatric: Normal judgment and insight. Alert and oriented x 3.  Normal mood.   EKG: Personally reviewed. Sinus rhythm, rate 103, no acute ischemic changes.  PVC present.  Rate is faster when compared to prior.  Assessment/Plan Principal Problem:   Nausea, vomiting, and diarrhea Active Problems:   Cervical cancer (HCC)   Chronic kidney disease, stage 3a (HCC)   Type 2 diabetes mellitus (HCC)   Vitamin B12 deficiency   Essential hypertension   Hypokalemia   Hypothyroidism   Misty Deleon is a 73 y.o. female with medical history significant for cervical cancer on maintenance bevacizumab, T2DM, CKD stage IIIa, HTN, HLD, hypothyroidism, anemia due to B12 deficiency, recent C. difficile colitis who is admitted with nausea, vomiting, diarrhea and inability to maintain adequate oral intake.  Assessment and Plan: Nausea/vomiting/diarrhea/dysgeusia: Persistent and worsening symptoms for at least a couple months.  Has not been able to maintain adequate oral intake.  Patient reports significant dysgeusia contributing to her symptoms and poor oral intake.  CT A/P shows mild rectal wall thickening, no obstruction.  Need to consider if bevacizumab is contributing to her symptoms. -Continue IV fluid hydration overnight -Start scheduled IV Reglan 10 mg 3 times daily, IV Zofran as needed for breakthrough nausea/vomiting -Follow C. difficile and GI pathogen panels -Advance diet as tolerated -If no improvement consider GI evaluation  Hypokalemia: Mild, supplementing.  Low back pain: Patient reports has been dealing with  lower back pain for a while now.  She was seen in the [facility] office by Dr. Shelle Iron prior to admission.  X-ray was obtained and showed an L5 vertebral body wedge compression fracture.  Outpatient MRI imaging is planned.  She does not have any focal weakness or alarm symptoms. -Continue analgesics as needed -PT/OT eval  CKD stage IIIa: Renal function stable.  Continue to monitor.  Cervical cancer: Follows with oncology Dr. Bertis Ruddy, currently on maintenance bevacizumab.  Type 2 diabetes: Well-controlled with last A1c 5.1% May 2024.  Will hold Amaryl and Januvia given diminished oral intake.  Hypertension: BP is stable.  Holding antihypertensives for now.  Hyperlipidemia: Continue rosuvastatin.  Hypothyroidism: Continue Synthroid.  Anemia/B12 deficiency: Hemoglobin is stable.  Monitor.   DVT prophylaxis: enoxaparin (LOVENOX) injection 40 mg Start: 10/15/23 2200 Code Status: Full code, confirmed with patient on admission Family Communication: Spouse at bedside Disposition Plan: From home and likely discharge to home pending clinical progress Consults called: Oncology Severity of Illness: The appropriate patient status for this patient is OBSERVATION. Observation status is judged to be reasonable and necessary in order to provide the required intensity of service to ensure the patient's safety. The patient's presenting symptoms, physical exam findings, and initial radiographic and laboratory data in the context of their medical condition is felt to place them at decreased risk for further clinical deterioration. Furthermore, it is anticipated that the patient will be medically stable for discharge from the hospital within 2 midnights of admission.   Darreld Mclean MD [facility]  If 7PM-7AM, please contact night-coverage [email or web address]  10/15/2023, 8:42 PM

## 2023-10-15 NOTE — ED Provider Notes (Signed)
  Physical Exam  BP 128/73   Pulse 96   Temp 98 F (36.7 C)   Resp (!) 23   Ht 5\' 1"  (1.549 m)   Wt 83.5 kg   SpO2 100%   BMI 34.77 kg/m   Physical Exam Vitals and nursing note reviewed.     Procedures  Procedures  ED Course / MDM   Clinical Course as of 10/15/23 1830  Mon Oct 15, 2023  1432 CBC with Diff(!) CBC shows normal white count, stable hemoglobin [JK]    Clinical Course User Index [JK] Linwood Dibbles, MD   Medical Decision Making Amount and/or Complexity of Data Reviewed Labs: ordered. Decision-making details documented in ED Course. Radiology: ordered.  Risk Prescription drug management.   Wardell Honour, assumed care for this patient.  In brief, 73 year old female here today for nausea, vomiting and diarrhea.  Patient with history of cervical cancer.  Patient was a signout pending CT imaging.  Patient's imaging showing probable proctitis.  Patient still pending C. difficile culture.  Patient's oncologist requesting admission.  Will plan to admit patient to hospital for symptom management, mild hypokalemia.       Anders Simmonds T, DO 10/15/23 [phone]

## 2023-10-15 NOTE — Telephone Encounter (Signed)
Called to follow up after going to the ER Friday. She is still not feeling any better and still having vomiting. She wanted to get admitted but was sent home. She was given phenergan Rx at the ER. Vomited x 1 today. Vomiting multiple times a day. Adding phenergan has not helped with vomiting. She is taking Zofran and phenergan for vomiting. She vomits after taking meds. She is not taking compazine Rx. She cannot remember the last time she kept any food down.  Started having diarrhea yesterday, x 1 episode today. She questions if it is c diff like before. [facility] a [facility]t for [facility] pain.  She would like to be admitted to the hospital and ask what Dr. Bertis Ruddy recommends.

## 2023-10-15 NOTE — Hospital Course (Signed)
Misty Deleon is a 73 y.o. female with medical history significant for cervical cancer on maintenance bevacizumab, T2DM, CKD stage IIIa, HTN, HLD, hypothyroidism, anemia due to B12 deficiency, recent C. difficile colitis who is admitted with nausea, vomiting, diarrhea and inability to maintain adequate oral intake.

## 2023-10-15 NOTE — ED Triage Notes (Signed)
Patient to ED by POV with c/o nausea. Patient husband stated patient was seen in [place] ED Friday for same complaint and had nausea meds changed. He also voices patient was diagnosed with C-diff in November and she is having same symptoms. She c/o nausea, vomiting and diarrhea.

## 2023-10-15 NOTE — Telephone Encounter (Signed)
I cannot directly admit her, she has to be admitted to ER With her ongoing vomiting she will need stat CT imaging of abdomen and pelvis for eval Advise her to go back to ER and ask for admission

## 2023-10-16 ENCOUNTER — Encounter (HOSPITAL_COMMUNITY): Payer: Self-pay | Admitting: Internal Medicine

## 2023-10-16 ENCOUNTER — Observation Stay (HOSPITAL_COMMUNITY): Payer: Medicare PPO

## 2023-10-16 DIAGNOSIS — R197 Diarrhea, unspecified: Secondary | ICD-10-CM | POA: Diagnosis not present

## 2023-10-16 DIAGNOSIS — M47817 Spondylosis without myelopathy or radiculopathy, lumbosacral region: Secondary | ICD-10-CM | POA: Diagnosis not present

## 2023-10-16 DIAGNOSIS — M47816 Spondylosis without myelopathy or radiculopathy, lumbar region: Secondary | ICD-10-CM | POA: Diagnosis not present

## 2023-10-16 DIAGNOSIS — C539 Malignant neoplasm of cervix uteri, unspecified: Secondary | ICD-10-CM | POA: Diagnosis not present

## 2023-10-16 DIAGNOSIS — R112 Nausea with vomiting, unspecified: Secondary | ICD-10-CM | POA: Diagnosis not present

## 2023-10-16 DIAGNOSIS — M48061 Spinal stenosis, lumbar region without neurogenic claudication: Secondary | ICD-10-CM | POA: Diagnosis not present

## 2023-10-16 DIAGNOSIS — M51369 Other intervertebral disc degeneration, lumbar region without mention of lumbar back pain or lower extremity pain: Secondary | ICD-10-CM | POA: Diagnosis not present

## 2023-10-16 LAB — CBC
HCT: 28.9 % — ABNORMAL LOW (ref 36.0–46.0)
Hemoglobin: 9.2 g/dL — ABNORMAL LOW (ref 12.0–15.0)
MCH: 32.2 pg (ref 26.0–34.0)
MCHC: 31.8 g/dL (ref 30.0–36.0)
MCV: 101 fL — ABNORMAL HIGH (ref 80.0–100.0)
Platelets: 145 10*3/uL — ABNORMAL LOW (ref 150–400)
RBC: 2.86 MIL/uL — ABNORMAL LOW (ref 3.87–5.11)
RDW: 14.7 % (ref 11.5–15.5)
WBC: 2.9 10*3/uL — ABNORMAL LOW (ref 4.0–10.5)
nRBC: 0 % (ref 0.0–0.2)

## 2023-10-16 LAB — COMPREHENSIVE METABOLIC PANEL
ALT: 18 U/L (ref 0–44)
AST: 20 U/L (ref 15–41)
Albumin: 2.7 g/dL — ABNORMAL LOW (ref 3.5–5.0)
Alkaline Phosphatase: 92 U/L (ref 38–126)
Anion gap: 9 (ref 5–15)
BUN: 12 mg/dL (ref 8–23)
CO2: 26 mmol/L (ref 22–32)
Calcium: 8.8 mg/dL — ABNORMAL LOW (ref 8.9–10.3)
Chloride: 102 mmol/L (ref 98–111)
Creatinine, Ser: 0.86 mg/dL (ref 0.44–1.00)
GFR, Estimated: >60 mL/min (ref >60)
Glucose, Bld: 95 mg/dL (ref 70–99)
Potassium: 3.6 mmol/L (ref 3.5–5.1)
Sodium: 137 mmol/L (ref 135–145)
Total Bilirubin: 0.5 mg/dL (ref 0.0–1.2)
Total Protein: 6 g/dL — ABNORMAL LOW (ref 6.5–8.1)

## 2023-10-16 LAB — MAGNESIUM: Magnesium: 2 mg/dL (ref 1.7–2.4)

## 2023-10-16 MED ORDER — MAGNESIUM SULFATE 2 GM/50ML IV SOLN
2.0000 g | Freq: Once | INTRAVENOUS | Status: AC
  Administered 2023-10-16: 2 g via INTRAVENOUS
  Filled 2023-10-16: qty 50

## 2023-10-16 MED ORDER — CHLORHEXIDINE GLUCONATE CLOTH 2 % EX PADS
6.0000 | MEDICATED_PAD | Freq: Every day | CUTANEOUS | Status: DC
  Administered 2023-10-16 – 2023-10-23 (×6): 6 via TOPICAL

## 2023-10-16 MED ORDER — SODIUM CHLORIDE 0.9% FLUSH
10.0000 mL | Freq: Two times a day (BID) | INTRAVENOUS | Status: DC
  Administered 2023-10-16: 20 mL
  Administered 2023-10-17 – 2023-10-18 (×3): 10 mL

## 2023-10-16 MED ORDER — THIAMINE MONONITRATE 100 MG PO TABS
100.0000 mg | ORAL_TABLET | Freq: Every day | ORAL | Status: DC
  Administered 2023-10-16 – 2023-10-18 (×3): 100 mg via ORAL
  Filled 2023-10-16 (×3): qty 1

## 2023-10-16 MED ORDER — SACCHAROMYCES BOULARDII 250 MG PO CAPS
250.0000 mg | ORAL_CAPSULE | Freq: Two times a day (BID) | ORAL | Status: DC
  Administered 2023-10-16 – 2023-10-18 (×5): 250 mg via ORAL
  Filled 2023-10-16 (×6): qty 1

## 2023-10-16 MED ORDER — SODIUM CHLORIDE 0.9% FLUSH: 10.0000 mL | INTRAVENOUS | Status: DC | PRN

## 2023-10-16 MED ORDER — ENSURE ENLIVE PO LIQD
237.0000 mL | Freq: Two times a day (BID) | ORAL | Status: DC
  Administered 2023-10-17 – 2023-10-18 (×3): 237 mL via ORAL

## 2023-10-16 NOTE — Plan of Care (Signed)
   Problem: Education: Goal: Knowledge of General Education information will improve Description Including pain rating scale, medication(s)/side effects and non-pharmacologic comfort measures Outcome: Progressing   Problem: Health Behavior/Discharge Planning: Goal: Ability to manage health-related needs will improve Outcome: Progressing

## 2023-10-16 NOTE — Progress Notes (Signed)
 Misty Deleon   DOB:07-22-1951   FM#:[id number]    ASSESSMENT & PLAN:  Cervical cancer Recent imaging studies show excellent response to therapy She is currently receiving maintenance bevacizumab  and that should not be the cause of her uncontrolled nausea and vomiting Continue supportive care   Recurrent nausea vomiting, dehydration and inability to keep food down Cause is unknown The patient had history of C. Difficile CT imaging show signs of proctitis but no bowel obstruction She felt a little better today with IV fluid support and antiemetics Recommend IV fluids and antiemetics   Chronic kidney disease stage III Creatinine is stable Hydration as tolerated   Anemia Multifactorial Observe only  Severe back pain She was evaluated by orthopedic surgeon, further evaluation is pending Responded well to IV pain medicine We will continue to reassess She can benefit from low-dose pain medicine upon discharge   Discharge planning Recommend another day of observation until she is able to eat and drink without nausea vomiting All questions were answered. The patient knows to call the clinic with any problems, questions or concerns.   The total time spent in the appointment was 30 minutes encounter with patients including review of chart and various tests results, discussions about plan of care and coordination of care plan  Almarie Bedford, MD 10/16/2023 10:23 AM  Subjective:  She felt better today.  She did not eat anything yesterday but able to tolerate small amount of food an hour ago.  No further nausea or vomiting.  Her pain was well-controlled and she had good night sleep She denies recent diarrhea Imaging studies were reviewed and discussed  Objective:  Vitals:   10/16/23 0855 10/16/23 1000  BP: (!) 99/59 109/74  Pulse: 88 88  Resp: 17 16  Temp:    SpO2: 94% 98%     Intake/Output Summary (Last 24 hours) at 10/16/2023 1023 Last data filed at 10/16/2023 0714 Gross per 24  hour  Intake 1328.17 ml  Output --  Net 1328.17 ml    GENERAL:alert, no distress and comfortable  NEURO: alert & oriented x 3 with fluent speech, no focal motor/sensory deficits   Labs:  Recent Labs    10/12/23 1828 10/15/23 1358 10/16/23 0229  NA 137 134* 137  K 3.7 3.3* 3.6  CL 103 102 102  CO2 25 20* 26  GLUCOSE 113* 80 95  BUN 11 12 12   CREATININE 1.11* 1.06* 0.86  CALCIUM  9.9 9.6 8.8*  GFRNONAA 53* 56* >60  PROT 6.1* 6.0* 6.0*  ALBUMIN  3.2* 3.3* 2.7*  AST 22 23 20   ALT 8 10 18   ALKPHOS 70 57 92  BILITOT 0.8 1.1 0.5    Studies: I have personally reviewed her CT imaging from 10/15/2023 CT ABDOMEN PELVIS W CONTRAST Result Date: 10/15/2023 CLINICAL DATA:  Acute abdominal pain. History of metastatic cervical cancer. EXAM: CT ABDOMEN AND PELVIS WITH CONTRAST TECHNIQUE: Multidetector CT imaging of the abdomen and pelvis was performed using the standard protocol following bolus administration of intravenous contrast. RADIATION DOSE REDUCTION: This exam was performed according to the departmental dose-optimization program which includes automated exposure control, adjustment of the mA and/or kV according to patient size and/or use of iterative reconstruction technique. CONTRAST:  OMNIPAQUE  IOHEXOL  300 MG/ML  SOLN COMPARISON:  CT chest abdomen and pelvis 09/28/2023. FINDINGS: Lower chest: No acute abnormality. Hepatobiliary: No focal liver abnormality is seen. No gallstones, gallbladder wall thickening, or biliary dilatation. Pancreas: Unremarkable. No pancreatic ductal dilatation or surrounding inflammatory changes. Spleen: Spleen  is upper limits of normal in size. Calcified granulomas are present. Adrenals/Urinary Tract: There is an 8 mm calculus in the inferior pole the right kidney. There is no hydronephrosis or perinephric fat stranding. The adrenal glands are within normal limits. There is mild fat stranding surrounding the bladder. Stomach/Bowel: Stomach is within normal  limits. Appendix appears normal. There is no bowel obstruction, pneumatosis or free air. There some mild rectal wall thickening. There is sigmoid colon diverticulosis. Vascular/Lymphatic: Aortic atherosclerosis. No enlarged abdominal or pelvic lymph nodes. Reproductive: Uterus and bilateral adnexa are unremarkable. Other: Presacral edema and perirectal edema appear unchanged. There is no ascites or focal abdominal wall hernia. Musculoskeletal: There stable compression deformity of the inferior endplate of L5. IMPRESSION: 1. Mild fat stranding surrounding the bladder worrisome for cystitis. 2. Mild rectal wall thickening worrisome for proctitis. 3. Nonobstructing right renal calculus. 4. Sigmoid colon diverticulosis. 5. Stable presacral and perirectal edema. 6. Aortic atherosclerosis. Aortic Atherosclerosis (ICD10-I70.0). Electronically Signed   By: Greig Pique M.D.   On: 10/15/2023 17:06   CT Head Wo Contrast Result Date: 10/12/2023 CLINICAL DATA:  Vertigo, central dizziness, nausea, 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 RADIATION DOSE REDUCTION: This exam was performed according to the departmental dose-optimization program which includes automated exposure control, adjustment of the mA and/or kV according to patient size and/or use of iterative reconstruction technique. COMPARISON:  MRI head June 20, 2007. FINDINGS: Brain: No evidence of acute infarction, hemorrhage, hydrocephalus, extra-axial collection or mass lesion/mass effect. Partially empty sella. Vascular: Calcific atherosclerosis. Skull: No acute fracture. Sinuses/Orbits: Clear sinuses.  No acute orbital findings. Other: No sizable mastoid effusions. IMPRESSION: No evidence of acute intracranial abnormality. Electronically Signed   By: Gilmore GORMAN Molt M.D.   On: 10/12/2023 22:45    CT CHEST ABDOMEN PELVIS W CONTRAST Result Date: 09/29/2023 CLINICAL DATA:  Metastatic cervical cancer, assess treatment response, chemotherapy and XRT complete * Tracking Code: BO * EXAM: CT CHEST, ABDOMEN, AND PELVIS WITH CONTRAST TECHNIQUE: Multidetector CT imaging of the chest, abdomen and pelvis was performed following the standard protocol during bolus administration of intravenous contrast. RADIATION DOSE REDUCTION: This exam was performed according to the departmental dose-optimization program which includes automated exposure control, adjustment of the mA and/or kV according to patient size and/or use of iterative reconstruction technique. CONTRAST:  OMNIPAQUE  IOHEXOL  300 MG/ML  SOLN COMPARISON:  CT chest abdomen pelvis, 05/17/2023 FINDINGS: CT CHEST FINDINGS Cardiovascular: Right chest port catheter. Aortic atherosclerosis. Normal heart size. Left and right coronary artery calcifications. No pericardial effusion. Mediastinum/Nodes: No enlarged mediastinal, hilar, or axillary lymph nodes. Small hiatal hernia. Small benign calcified left hilar lymph nodes. Thyroid  gland, trachea, and esophagus demonstrate no significant findings. Lungs/Pleura: Interval enlargement of small adjacent nodules in the dependent left lower lobe measuring 0.3 cm, previously no greater than 0.1 cm (series 6, image 19). Otherwise unchanged small pulmonary nodules, for example measuring 0.3 cm in the peripheral right upper lobe (series 6, image 38), additional nodule in the superior segment right lower lobe measuring 0.4 cm (series 6, image 83). No pleural effusion or pneumothorax. Musculoskeletal: No chest wall abnormality. No acute osseous findings. CT ABDOMEN PELVIS FINDINGS Hepatobiliary: No solid liver abnormality is seen. No gallstones, gallbladder wall thickening, or biliary dilatation. Pancreas: Unremarkable. No pancreatic ductal dilatation or surrounding inflammatory changes. Spleen: Normal in size without  significant  abnormality. Punctuate granulomatous calcifications of the spleen. Adrenals/Urinary Tract: Adrenal glands are unremarkable. Small nonobstructive right renal calculi. No left-sided calculi, ureteral calculi, or hydronephrosis. Bladder is unremarkable. Stomach/Bowel: Stomach is within normal limits. Appendix appears normal. No evidence of bowel wall thickening, distention, or inflammatory changes. Sigmoid diverticulosis. Vascular/Lymphatic: No significant vascular findings are present. No enlarged abdominal or pelvic lymph nodes. Reproductive: Status post hysterectomy. Unchanged post treatment/post radiation appearance of the pelvis with presacral fat stranding (series 2, image 47). Other: No abdominal wall hernia or abnormality. No ascites. Musculoskeletal: No acute osseous findings. IMPRESSION: 1. Interval enlargement of small adjacent nodules in the dependent left lower lobe measuring 0.3 cm, previously no greater than 0.1 cm. These are nonspecific and may be infectious or inflammatory, however suspicious for tiny enlarging metastases. Attention on follow-up warranted. 2. Otherwise unchanged small pulmonary nodules. 3. Status post hysterectomy with unchanged post treatment/post radiation appearance of the pelvis with presacral fat stranding. 4. No evidence of lymphadenopathy or metastatic disease in the abdomen or pelvis. 5. Nonobstructive right nephrolithiasis. 6. Coronary artery disease. Aortic Atherosclerosis (ICD10-I70.0). Electronically Signed   By: Marolyn JONETTA Jaksch M.D.   On: 09/29/2023 22:16

## 2023-10-16 NOTE — Evaluation (Signed)
 Occupational Therapy Evaluation Patient Details Name: Misty Deleon MRN: [id number] DOB: [birth year] Today's Date: 10/16/2023   History of Present Illness Patient is a 73 year old female who presented on 2/3 with nausea. Patient was admitted with hypokalemia, low back pain,and dysgeusia. PMH: cervical cancer,CKD, anemia, HTN, HLD, b12 deficiency, DM II.   Clinical Impression   Patient is a 73 year old female who was admitted for above. Patient was living at home with husband support at [facility] level. Of note patient had a ortho visit with L5 compression fracture and recommendations for back brace for comfort with back precautions. Patient declined to wear brace reporting it caused more pain with husband reporting it is in the car at this time.  Currently, patient is mod A for bed mobility with education log rolling, min A for transfers and functional mobility with RW, and max A for LB Dressing/bathing tasks. Patient was noted to have decreased functional activity tolerance, decreased endurance, decreased standing balance, decreased safety awareness, and decreased knowledge of AD/AE impacting participation in ADLs. Patient plans to d/c home with husband when medically stable. Patient would continue to benefit from skilled OT services at this time while admitted and after d/c to address noted deficits in order to improve overall safety and independence in ADLs.        If plan is discharge home, recommend the following: A little help with walking and/or transfers;A little help with bathing/dressing/bathroom;Assistance with cooking/housework;Direct supervision/assist for medications management;Assist for transportation;Help with stairs or ramp for entrance;Direct supervision/assist for financial management    Functional Status Assessment  Patient has had a recent decline in their functional status and demonstrates the ability to make significant improvements in function in a reasonable and  predictable amount of time.  Equipment Recommendations  None recommended by OT       Precautions / Restrictions Precautions Precautions: Fall Restrictions Weight Bearing Restrictions Per Provider Order: No      Mobility Bed Mobility Overal bed mobility: Needs Assistance Bed Mobility: Sidelying to Sit, Rolling Rolling: Min assist Sidelying to sit: Mod assist       General bed mobility comments: with education on log rolling to prevent increased back pain.          Balance Overall balance assessment: Needs assistance Sitting-balance support: Feet unsupported Sitting balance-Leahy Scale: Fair     Standing balance support: Reliant on assistive device for balance, Bilateral upper extremity supported Standing balance-Leahy Scale: Poor         ADL either performed or assessed with clinical judgement   ADL Overall ADL's : Needs assistance/impaired Eating/Feeding: Supervision/ safety;Sitting   Grooming: Sitting;Set up   Upper Body Bathing: Sitting;Set up   Lower Body Bathing: Maximal assistance;Sitting/lateral leans   Upper Body Dressing : Sitting;Set up   Lower Body Dressing: Sitting/lateral leans;Maximal assistance   Toilet Transfer: Minimal assistance;Ambulation;Rolling walker (2 wheels) Toilet Transfer Details (indicate cue type and reason): with increased time Toileting- Clothing Manipulation and Hygiene: Maximal assistance;Sit to/from stand       Functional mobility during ADLs: Minimal assistance;Rolling walker (2 wheels)       Vision Baseline Vision/History: 1 Wears glasses              Pertinent Vitals/Pain Pain Assessment Pain Assessment: 0-10 Pain Score: 6  Pain Location: bilateral hips/groin area Pain Descriptors / Indicators: Discomfort, Grimacing, Constant Pain Intervention(s): Limited activity within patient's tolerance, Monitored during session, Repositioned, Premedicated before session     Extremity/Trunk Assessment Upper  Extremity Assessment Upper  Extremity Assessment: Overall WFL for tasks assessed   Lower Extremity Assessment Lower Extremity Assessment: Defer to PT evaluation   Cervical / Trunk Assessment Cervical / Trunk Assessment: Normal   Communication Communication Communication: No apparent difficulties   Cognition Arousal: Alert Behavior During Therapy: WFL for tasks assessed/performed Overall Cognitive Status: Within Functional Limits for tasks assessed             General Comments: husband was present as well.     General Comments  VSS            Home Living Family/patient expects to be discharged to:: Private residence Living Arrangements: Spouse/significant other Available Help at Discharge: Family Type of Home: House Home Access: Stairs to enter Secretary/administrator of Steps: 4 Entrance Stairs-Rails: Left Home Layout: One level               Home Equipment: Rollator (4 wheels)          Prior Functioning/Environment Prior Level of Function : Independent/Modified Independent                        OT Problem List: Decreased activity tolerance;Impaired balance (sitting and/or standing);Decreased safety awareness;Decreased knowledge of precautions;Decreased knowledge of use of DME or AE      OT Treatment/Interventions: Self-care/ADL training;Energy conservation;Balance training;Therapeutic exercise;DME and/or AE instruction;Therapeutic activities;Patient/family education    OT Goals(Current goals can be found in the care plan section) Acute Rehab OT Goals Patient Stated Goal: to get pain under control and stop nausea OT Goal Formulation: With patient/family Time For Goal Achievement: 10/30/23 Potential to Achieve Goals: Fair  OT Frequency: Min 1X/week    Co-evaluation PT/OT/SLP Co-Evaluation/Treatment: Yes Reason for Co-Treatment: To address functional/ADL transfers PT goals addressed during session: Mobility/safety with mobility OT goals  addressed during session: ADL's and self-care      AM-PAC OT 6 Clicks Daily Activity     Outcome Measure Help from another person eating meals?: None Help from another person taking care of personal grooming?: A Little Help from another person toileting, which includes using toliet, bedpan, or urinal?: A Lot Help from another person bathing (including washing, rinsing, drying)?: A Lot Help from another person to put on and taking off regular upper body clothing?: A Little Help from another person to put on and taking off regular lower body clothing?: A Lot 6 Click Score: 16   End of Session Equipment Utilized During Treatment: Gait belt;Rolling walker (2 wheels)  Activity Tolerance: Patient tolerated treatment well Patient left: in bed;with call bell/phone within reach;with family/visitor present (in ED)  OT Visit Diagnosis: Unsteadiness on feet (R26.81);Other abnormalities of gait and mobility (R26.89);Pain;Muscle weakness (generalized) (M62.81)                Time: 9086-9068 OT Time Calculation (min): 18 min Charges:  OT General Charges $OT Visit: 1 Visit OT Evaluation $OT Eval Low Complexity: 1 Low  Archana Eckman OTR/L, MS Acute Rehabilitation Department Office# [phone]   Geofm CHRISTELLA Dance 10/16/2023, 10:42 AM

## 2023-10-16 NOTE — Progress Notes (Signed)
 [facility]  PROGRESS NOTE  Misty Deleon FMW:[id number] DOB: 01-18-1951 DOA: 10/15/2023 PCP: Salina Clarity, NP   Brief HPI:    73 y.o. female with medical history significant for cervical cancer on maintenance bevacizumab , T2DM, CKD stage IIIa, HTN, HLD, hypothyroidism, anemia due to B12 deficiency, recent C. difficile infection who presented to the ED for evaluation of persistent nausea, vomiting, and diarrhea.  Patient was previously admitted late October 2024 with acute pyelonephritis treated with cephalosporins. She was readmitted shortly after at the beginning of November for C. difficile diarrhea. She was treated with oral vancomycin .   CT abdomen/pelvis with contrast showed mild fat stranding surrounding the bladder, mild rectal wall thickening worrisome for proctitis. Nonobstructing right renal calculus, sigmoid colon diverticulosis, stable presacral and perirectal edema.  C. difficile and GI pathogen panels pending collection.    Assessment/Plan:   Nausea/vomiting/diarrhea/dysgeusia: Persistent and worsening symptoms for at least a couple months.  Has not been able to maintain adequate oral intake.  Patient reports significant dysgeusia contributing to her symptoms and poor oral intake.  CT A/P shows mild rectal wall thickening, no obstruction.  Need to consider if bevacizumab  is contributing to her symptoms. -Nausea and vomiting improved with IV Reglan  -Continue Zofran  as needed -C. difficile PCR as well as GI pathogen panel ordered however no sample obtained as patient has not had BM since she came to hospital -Will start Florastor to 50 mg p.o. twice daily -Start scheduled IV Reglan  10 mg 3 times daily, IV Zofran  as needed for breakthrough nausea/vomiting -Continue full liquid diet   Hypokalemia: Replete   Low back pain: Patient reports has been dealing with lower back pain for a while now.  She was seen in the [facility] office by Dr. Duwayne prior to admission.  X-ray  was obtained and showed an L5 vertebral body wedge compression fracture.  Outpatient MRI imaging is planned.  She does not have any focal weakness or alarm symptoms. -Continue analgesics as needed -PT/OT eval   CKD stage IIIa: Renal function stable.  Continue to monitor.   Cervical cancer: Follows with oncology Dr. Lonn, currently on maintenance bevacizumab .   Type 2 diabetes: Well-controlled with last A1c 5.1% May 2024.  Will hold Amaryl and Januvia  given diminished oral intake.   Hypertension: BP is stable.  Holding antihypertensives for now.   Hyperlipidemia: Continue rosuvastatin .   Hypothyroidism: Continue Synthroid .   Anemia/B12 deficiency: Hemoglobin is stable.  Monitor.    Medications     enoxaparin  (LOVENOX ) injection  40 mg Subcutaneous Q24H   levothyroxine   50 mcg Oral QAC breakfast   metoCLOPramide  (REGLAN ) injection  10 mg Intravenous Q8H   rosuvastatin   40 mg Oral QHS   sodium chloride  flush  3 mL Intravenous Q12H     Data Reviewed:   CBG:  No results for input(s): GLUCAP in the last 168 hours.  SpO2: 98 %    Vitals:   10/15/23 2215 10/15/23 2230 10/16/23 0130 10/16/23 0629  BP: 123/67 (!) 116/59 (!) 131/95 103/60  Pulse: (!) 101 95 (!) 102 89  Resp: 16 20 12 15   Temp:   98.2 F (36.8 C) 98.1 F (36.7 C)  TempSrc:   Oral Oral  SpO2: 99% 94% 99% 98%  Weight:      Height:          Data Reviewed:  Basic Metabolic Panel: Recent Labs  Lab 10/12/23 1828 10/15/23 1358 10/16/23 0229  NA 137 134* 137  K 3.7 3.3* 3.6  CL  103 102 102  CO2 25 20* 26  GLUCOSE 113* 80 95  BUN 11 12 12   CREATININE 1.11* 1.06* 0.86  CALCIUM  9.9 9.6 8.8*  MG  --  1.4* 2.0    CBC: Recent Labs  Lab 10/12/23 1828 10/15/23 1358  WBC 4.3 4.4  NEUTROABS  --  2.3  HGB 10.2* 10.6*  HCT 30.6* 32.3*  MCV 98.1 100.3*  PLT 181 179    LFT Recent Labs  Lab 10/12/23 1828 10/15/23 1358 10/16/23 0229  AST 22 23 20   ALT 8 10 18   ALKPHOS 70 57 92   BILITOT 0.8 1.1 0.5  PROT 6.1* 6.0* 6.0*  ALBUMIN  3.2* 3.3* 2.7*     Antibiotics: Anti-infectives (From admission, onward)    None        DVT prophylaxis: Lovenox   Code Status: Full code  Family Communication:    CONSULTS    Subjective   Denies nausea vomiting and diarrhea since patient came to hospital.  Says Reglan  is helping her   Objective    Physical Examination:   General-appears in no acute distress Heart-S1-S2, regular, no murmur auscultated Lungs-clear to auscultation bilaterally, no wheezing or crackles auscultated Abdomen-soft, nontender, no organomegaly Extremities-no edema in the lower extremities Neuro-alert, oriented x3, no focal deficit noted   Status is: Inpatient:             Sabas GORMAN Brod   [facility] If 7PM-7AM, please contact night-coverage at [email or web address], Office  [phone]   10/16/2023, 7:58 AM  LOS: 0 days

## 2023-10-16 NOTE — Evaluation (Signed)
 Physical Therapy Evaluation Patient Details Name: Misty Deleon MRN: [id number] DOB: 11/30/1950 Today's Date: 10/16/2023  History of Present Illness  Patient is a 73 year old female who presented on 2/3 with nausea. Patient was admitted with hypokalemia, low back pain,and dysgeusia. PMH:L5 compression fracture per ortho visit on 12/3 prior to ED visit, cervical cancer,CKD, anemia, HTN, HLD, b12 deficiency, DM II.  Clinical Impression  Pt admitted with above diagnosis.  Pt currently with functional limitations due to the deficits listed below (see PT Problem List). Pt will benefit from acute skilled PT to increase their independence and safety with mobility to allow discharge.     The patient  reports that her daughter encourages with mobility using rollator at home.  Patient reports receiving a corset at ortho 2/3.  Patient should progress to return home with family support. Patient relates her   nausea  is most concern  for this  hospital visit.      If plan is discharge home, recommend the following: A little help with walking and/or transfers;Assist for transportation;Assistance with cooking/housework;Help with stairs or ramp for entrance   Can travel by private vehicle        Equipment Recommendations None recommended by PT  Recommendations for Other Services       Functional Status Assessment Patient has had a recent decline in their functional status and demonstrates the ability to make significant improvements in function in a reasonable and predictable amount of time.     Precautions / Restrictions Precautions Precautions: Fall Precaution Comments: back precautions- L5 compression Required Braces or Orthoses: Other Brace Other Brace: reports having a corset in the car Restrictions Weight Bearing Restrictions Per Provider Order: No      Mobility  Bed Mobility   Bed Mobility: Sidelying to Sit, Rolling           General bed mobility comments: with education on  log rolling to prevent increased back pain.    Transfers Overall transfer level: Needs assistance Equipment used: Rolling walker (2 wheels) Transfers: Sit to/from Stand Sit to Stand: Contact guard assist           General transfer comment: use of RW for support    Ambulation/Gait Ambulation/Gait assistance: Contact guard assist Gait Distance (Feet): 70 Feet Assistive device: Rolling walker (2 wheels) Gait Pattern/deviations: Step-through pattern, Decreased stride length Gait velocity: decr     General Gait Details: gait is slow, antalgic to the   right  Stairs            Wheelchair Mobility     Tilt Bed    Modified Rankin (Stroke Patients Only)       Balance Overall balance assessment: Needs assistance Sitting-balance support: Feet unsupported Sitting balance-Leahy Scale: Fair     Standing balance support: Reliant on assistive device for balance, Bilateral upper extremity supported Standing balance-Leahy Scale: Fair                               Pertinent Vitals/Pain Pain Assessment Pain Score: 6  Pain Location: bilateral hips/groin area Pain Descriptors / Indicators: Discomfort, Grimacing, Constant Pain Intervention(s): Limited activity within patient's tolerance, Monitored during session, Premedicated before session    Home Living Family/patient expects to be discharged to:: Private residence Living Arrangements: Spouse/significant other Available Help at Discharge: Family Type of Home: House Home Access: Stairs to enter Entrance Stairs-Rails: Left Entrance Stairs-Number of Steps: 4   Home Layout: One  level Home Equipment: [employer] (2 wheels) Additional Comments: dtr is a HHPT    Prior Function Prior Level of Function : Needs assist       Physical Assist : Mobility (physical) Mobility (physical): Bed mobility   Mobility Comments: uses rollator, amb around house multiple times       Extremity/Trunk Assessment    Upper Extremity Assessment Upper Extremity Assessment: Overall WFL for tasks assessed    Lower Extremity Assessment Lower Extremity Assessment: Generalized weakness    Cervical / Trunk Assessment Cervical / Trunk Assessment: Normal  Communication   Communication Communication: No apparent difficulties  Cognition Arousal: Alert Behavior During Therapy: WFL for tasks assessed/performed Overall Cognitive Status: Within Functional Limits for tasks assessed                                 General Comments: husband was present as well.        General Comments General comments (skin integrity, edema, etc.): VSS    Exercises     Assessment/Plan    PT Assessment Patient needs continued PT services  PT Problem List Decreased strength;Decreased knowledge of precautions;Pain;Decreased mobility;Decreased activity tolerance       PT Treatment Interventions DME instruction;Therapeutic activities;Gait training;Therapeutic exercise;Patient/family education;Functional mobility training    PT Goals (Current goals can be found in the Care Plan section)  Acute Rehab PT Goals Patient Stated Goal: to not have pain PT Goal Formulation: With patient/family Time For Goal Achievement: 10/30/23 Potential to Achieve Goals: Fair    Frequency Min 1X/week     Co-evaluation PT/OT/SLP Co-Evaluation/Treatment: Yes Reason for Co-Treatment: To address functional/ADL transfers PT goals addressed during session: Mobility/safety with mobility OT goals addressed during session: ADL's and self-care       AM-PAC PT 6 Clicks Mobility  Outcome Measure Help needed turning from your back to your side while in a flat bed without using bedrails?: A Little Help needed moving from lying on your back to sitting on the side of a flat bed without using bedrails?: A Little Help needed moving to and from a bed to a chair (including a wheelchair)?: A Little Help needed standing up from a chair  using your arms (e.g., wheelchair or bedside chair)?: A Little Help needed to walk in hospital room?: A Little Help needed climbing 3-5 steps with a railing? : A Lot 6 Click Score: 17    End of Session Equipment Utilized During Treatment: Gait belt Activity Tolerance: Patient tolerated treatment well;Patient limited by fatigue Patient left: in bed;with call bell/phone within reach;with family/visitor present Nurse Communication: Mobility status PT Visit Diagnosis: Unsteadiness on feet (R26.81);Muscle weakness (generalized) (M62.81);Pain Pain - Right/Left: Right Pain - part of body: Leg    Time: 0923-0938 PT Time Calculation (min) (ACUTE ONLY): 15 min   Charges:   PT Evaluation $PT Eval Low Complexity: 1 Low   PT General Charges $$ ACUTE PT VISIT: 1 Visit         Darice Potters PT Acute Rehabilitation Services Office [phone] Weekend pager-[phone]   Potters Darice Norris 10/16/2023, 1:16 PM

## 2023-10-17 DIAGNOSIS — R197 Diarrhea, unspecified: Secondary | ICD-10-CM | POA: Diagnosis not present

## 2023-10-17 DIAGNOSIS — R112 Nausea with vomiting, unspecified: Secondary | ICD-10-CM | POA: Diagnosis not present

## 2023-10-17 NOTE — Care Management Obs Status (Signed)
 MEDICARE OBSERVATION STATUS NOTIFICATION   Patient Details  Name: Misty Deleon MRN: [id number] Date of Birth: 08/17/1951   Medicare Observation Status Notification Given:  Yes    Loreda Rodriguez, RN 10/17/2023, 10:39 AM

## 2023-10-17 NOTE — Progress Notes (Signed)
 Mobility Specialist - Progress Note   10/17/23 1029  Mobility  Activity Ambulated with assistance in hallway  Level of Assistance Contact guard assist, steadying assist  Assistive Device Front wheel walker  Distance Ambulated (ft) 60 ft  Range of Motion/Exercises Active  Activity Response Tolerated fair  Mobility Referral Yes  Mobility visit 1 Mobility  Mobility Specialist Start Time (ACUTE ONLY) 1029  Mobility Specialist Stop Time (ACUTE ONLY) 1043  Mobility Specialist Time Calculation (min) (ACUTE ONLY) 14 min   Received in bed and agreed to mobility. Log rolled out of bed with Min A. Contact for STS and ambulation. Endorsed pain in lumbar spine. Back brace was in car, told husband that it would be a good idea to have it just in case. Returned to restroom where pt was left with all needs met, restroom string in reach.  Cyndee Ada Mobility Specialist

## 2023-10-17 NOTE — Plan of Care (Signed)
   Problem: Education: Goal: Knowledge of General Education information will improve Description Including pain rating scale, medication(s)/side effects and non-pharmacologic comfort measures Outcome: Progressing

## 2023-10-17 NOTE — Progress Notes (Signed)
 PROGRESS NOTE    Misty Deleon  FMW:[id number] DOB: [birth year] DOA: 10/15/2023 PCP: Salina Clarity, NP   Brief Narrative:  73 y.o. female with medical history significant for cervical cancer on maintenance bevacizumab , T2DM, CKD stage IIIa, HTN, HLD, hypothyroidism, anemia due to B12 deficiency, recent C. difficile infection who presented to the ED for evaluation of persistent nausea, vomiting, and diarrhea.  Patient was previously admitted late October 2024 with acute pyelonephritis treated with cephalosporins. She was readmitted shortly after at the beginning of November for C. difficile diarrhea. She was treated with oral vancomycin .    CT abdomen/pelvis with contrast showed mild fat stranding surrounding the bladder, mild rectal wall thickening worrisome for proctitis. Nonobstructing right renal calculus, sigmoid colon diverticulosis, stable presacral and perirectal edema.  C. difficile and GI pathogen panels pending collection.    Assessment & Plan:   Principal Problem:   Nausea, vomiting, and diarrhea Active Problems:   Cervical cancer (HCC)   Chronic kidney disease, stage 3a (HCC)   Type 2 diabetes mellitus (HCC)   Vitamin B12 deficiency   Essential hypertension   Hypokalemia   Hypothyroidism   Nausea/vomiting/diarrhea/dysgeusia: -Insidious onset over the last couple months, continue to advance diet as tolerated -CT abdomen pelvis shows mild rectal thickening without overt obstruction -Improving with supportive care, Reglan , Zofran  -Stool studies ordered but not obtained -Florastor to 50 mg p.o. twice daily -Transition to p.o. medications  Hypokalemia: Replete and follow   Low back pain, chronic: -Follows with [facility], MRI performed while in hospital showing endplate compression at L4, moderate to severe spinal canal narrowing at L3-4 and foraminal narrowing at L3-4 and L4-5 -Plan for outpatient follow-up after discharge -no focal paresthesias or weakness that  would indicate urgent evaluation  CKD stage IIIa: Renal function stable.  Continue to monitor. Cervical cancer: Follows with Dr. Lonn fusi bevacizumab . Type 2 diabetes:, Well-controlled -hold p.o. medications in the interim -restart at discharge  Hypertension: Blood pressure currently well-controlled off home medications. Hyperlipidemia: Continue rosuvastatin . Hypothyroidism: Continue Synthroid . Anemia/B12 deficiency: Continue supplementation  DVT prophylaxis: enoxaparin  (LOVENOX ) injection 40 mg Start: 10/15/23 2200 Code Status:   Code Status: Full Code Family Communication: Husband at bedside  Status is: Inpatient  Dispo: The patient is from: Home              Anticipated d/c is to: Home              Anticipated d/c date is: 24 to 48 hours              Patient currently not medically stable for discharge  Consultants:  Oncology  Procedures:  None  Antimicrobials:  None  Subjective: No acute issues or events overnight, continues to have moderate nausea without vomiting, pain poorly controlled -discussed need for alerting staff when pain is not controlled as her medications are ordered as needed rather than scheduled.  Objective: Vitals:   10/17/23 0604 10/17/23 0941 10/17/23 1313 10/17/23 1403  BP: (!) 112/57 102/63 (!) 87/49 (!) 89/55  Pulse: (!) 108 (!) 112 (!) 117   Resp: 19 16 18    Temp: 99.4 F (37.4 C) 98.9 F (37.2 C) 98.6 F (37 C)   TempSrc: Oral     SpO2: 100% 100% 94%   Weight:      Height:        Intake/Output Summary (Last 24 hours) at 10/17/2023 1416 Last data filed at 10/17/2023 1100 Gross per 24 hour  Intake 705 ml  Output 575 ml  Net  130 ml   Filed Weights   10/15/23 1151 10/17/23 0500  Weight: 83.5 kg 84.9 kg    Examination:  General:  Pleasantly resting in bed, No acute distress. HEENT:  Normocephalic atraumatic.  Sclerae nonicteric, noninjected.  Extraocular movements intact bilaterally. Neck:  Without mass or deformity.  Trachea is  midline. Lungs:  Clear to auscultate bilaterally without rhonchi, wheeze, or rales. Heart:  Regular rate and rhythm.  Without murmurs, rubs, or gallops. Abdomen:  Soft, nontender, nondistended.  Without guarding or rebound. Extremities: Without cyanosis, clubbing, edema, or obvious deformity. Skin:  Warm and dry, no erythema.   Data Reviewed: I have personally reviewed following labs and imaging studies  CBC: Recent Labs  Lab 10/12/23 1828 10/15/23 1358 10/16/23 0818  WBC 4.3 4.4 2.9*  NEUTROABS  --  2.3  --   HGB 10.2* 10.6* 9.2*  HCT 30.6* 32.3* 28.9*  MCV 98.1 100.3* 101.0*  PLT 181 179 145*   Basic Metabolic Panel: Recent Labs  Lab 10/12/23 1828 10/15/23 1358 10/16/23 0229  NA 137 134* 137  K 3.7 3.3* 3.6  CL 103 102 102  CO2 25 20* 26  GLUCOSE 113* 80 95  BUN 11 12 12   CREATININE 1.11* 1.06* 0.86  CALCIUM  9.9 9.6 8.8*  MG  --  1.4* 2.0   GFR: Estimated Creatinine Clearance: 59.3 mL/min (by C-G formula based on SCr of 0.86 mg/dL). Liver Function Tests: Recent Labs  Lab 10/12/23 1828 10/15/23 1358 10/16/23 0229  AST 22 23 20   ALT 8 10 18   ALKPHOS 70 57 92  BILITOT 0.8 1.1 0.5  PROT 6.1* 6.0* 6.0*  ALBUMIN  3.2* 3.3* 2.7*   Recent Labs  Lab 10/12/23 1828 10/15/23 1358  LIPASE 34 34    Recent Results (from the past 240 hours)  Resp panel by RT-PCR (RSV, Flu A&B, Covid) Anterior Nasal Swab     Status: None   Collection Time: 10/12/23  8:07 PM   Specimen: Anterior Nasal Swab  Result Value Ref Range Status   SARS Coronavirus 2 by RT PCR NEGATIVE NEGATIVE Final    Comment: (NOTE) SARS-CoV-2 target nucleic acids are NOT DETECTED.  The SARS-CoV-2 RNA is generally detectable in upper respiratory specimens during the acute phase of infection. The lowest concentration of SARS-CoV-2 viral copies this assay can detect is 138 copies/mL. A negative result does not preclude SARS-Cov-2 infection and should not be used as the sole basis for treatment  or other patient management decisions. A negative result may occur with  improper specimen collection/handling, submission of specimen other than nasopharyngeal swab, presence of viral mutation(s) within the areas targeted by this assay, and inadequate number of viral copies(<138 copies/mL). A negative result must be combined with clinical observations, patient history, and epidemiological information. The expected result is Negative.  Fact Sheet for Patients:  bloggercourse.com  Fact Sheet for Healthcare Providers:  seriousbroker.it  This test is no t yet approved or cleared by the [place]  FDA and  has been authorized for detection and/or diagnosis of SARS-CoV-2 by FDA under an Emergency Use Authorization (EUA). This EUA will remain  in effect (meaning this test can be used) for the duration of the COVID-19 declaration under Section 564(b)(1) of the Act, 21 U.S.C.section 360bbb-3(b)(1), unless the authorization is terminated  or revoked sooner.       Influenza A by PCR NEGATIVE NEGATIVE Final   Influenza B by PCR NEGATIVE NEGATIVE Final    Comment: (NOTE) The Xpert Xpress SARS-CoV-2/FLU/RSV plus assay  is intended as an aid in the diagnosis of influenza from Nasopharyngeal swab specimens and should not be used as a sole basis for treatment. Nasal washings and aspirates are unacceptable for Xpert Xpress SARS-CoV-2/FLU/RSV testing.  Fact Sheet for Patients: bloggercourse.com  Fact Sheet for Healthcare Providers: seriousbroker.it  This test is not yet approved or cleared by the [place]  FDA and has been authorized for detection and/or diagnosis of SARS-CoV-2 by FDA under an Emergency Use Authorization (EUA). This EUA will remain in effect (meaning this test can be used) for the duration of the COVID-19 declaration under Section 564(b)(1) of the Act, 21 U.S.C. section  360bbb-3(b)(1), unless the authorization is terminated or revoked.     Resp Syncytial Virus by PCR NEGATIVE NEGATIVE Final    Comment: (NOTE) Fact Sheet for Patients: bloggercourse.com  Fact Sheet for Healthcare Providers: seriousbroker.it  This test is not yet approved or cleared by the [place]  FDA and has been authorized for detection and/or diagnosis of SARS-CoV-2 by FDA under an Emergency Use Authorization (EUA). This EUA will remain in effect (meaning this test can be used) for the duration of the COVID-19 declaration under Section 564(b)(1) of the Act, 21 U.S.C. section 360bbb-3(b)(1), unless the authorization is terminated or revoked.  Performed at [facility], [address] [place], [place] [address]   Resp panel by RT-PCR (RSV, Flu A&B, Covid) Anterior Nasal Swab     Status: None   Collection Time: 10/15/23 10:18 PM   Specimen: Anterior Nasal Swab  Result Value Ref Range Status   SARS Coronavirus 2 by RT PCR NEGATIVE NEGATIVE Final    Comment: (NOTE) SARS-CoV-2 target nucleic acids are NOT DETECTED.  The SARS-CoV-2 RNA is generally detectable in upper respiratory specimens during the acute phase of infection. The lowest concentration of SARS-CoV-2 viral copies this assay can detect is 138 copies/mL. A negative result does not preclude SARS-Cov-2 infection and should not be used as the sole basis for treatment or other patient management decisions. A negative result may occur with  improper specimen collection/handling, submission of specimen other than nasopharyngeal swab, presence of viral mutation(s) within the areas targeted by this assay, and inadequate number of viral copies(<138 copies/mL). A negative result must be combined with clinical observations, patient history, and epidemiological information. The expected result is Negative.  Fact Sheet for Patients:   bloggercourse.com  Fact Sheet for Healthcare Providers:  seriousbroker.it  This test is no t yet approved or cleared by the [place]  FDA and  has been authorized for detection and/or diagnosis of SARS-CoV-2 by FDA under an Emergency Use Authorization (EUA). This EUA will remain  in effect (meaning this test can be used) for the duration of the COVID-19 declaration under Section 564(b)(1) of the Act, 21 U.S.C.section 360bbb-3(b)(1), unless the authorization is terminated  or revoked sooner.       Influenza A by PCR NEGATIVE NEGATIVE Final   Influenza B by PCR NEGATIVE NEGATIVE Final    Comment: (NOTE) The Xpert Xpress SARS-CoV-2/FLU/RSV plus assay is intended as an aid in the diagnosis of influenza from Nasopharyngeal swab specimens and should not be used as a sole basis for treatment. Nasal washings and aspirates are unacceptable for Xpert Xpress SARS-CoV-2/FLU/RSV testing.  Fact Sheet for Patients: bloggercourse.com  Fact Sheet for Healthcare Providers: seriousbroker.it  This test is not yet approved or cleared by the [place]  FDA and has been authorized for detection and/or diagnosis of SARS-CoV-2 by FDA under an Emergency Use Authorization (EUA).  This EUA will remain in effect (meaning this test can be used) for the duration of the COVID-19 declaration under Section 564(b)(1) of the Act, 21 U.S.C. section 360bbb-3(b)(1), unless the authorization is terminated or revoked.     Resp Syncytial Virus by PCR NEGATIVE NEGATIVE Final    Comment: (NOTE) Fact Sheet for Patients: bloggercourse.com  Fact Sheet for Healthcare Providers: seriousbroker.it  This test is not yet approved or cleared by the [place]  FDA and has been authorized for detection and/or diagnosis of SARS-CoV-2 by FDA under an Emergency Use  Authorization (EUA). This EUA will remain in effect (meaning this test can be used) for the duration of the COVID-19 declaration under Section 564(b)(1) of the Act, 21 U.S.C. section 360bbb-3(b)(1), unless the authorization is terminated or revoked.  Performed at [facility], [address] [place], [place] [address]       Radiology Studies: MR LUMBAR SPINE WO CONTRAST Result Date: 10/16/2023 CLINICAL DATA:  Back paint EXAM: MRI LUMBAR SPINE WITHOUT CONTRAST TECHNIQUE: Multiplanar, multisequence MR imaging of the lumbar spine was performed. No intravenous contrast was administered. COMPARISON:  CT chest abdomen and pelvis 05/17/2023, CT abdomen and pelvis 10/15/2023 FINDINGS: Segmentation: Transitional anatomy with sacralization of L5 and a rudimentary disc space at L5-S1. Alignment:  Physiologic. Vertebrae: Redemonstrated inferior endplate compression deformity at L4 with persistent bone marrow edema. No acute fracture. No evidence of discitis/osteomyelitis. No focal bone lesion. Conus medullaris and cauda equina: Conus extends to the L1 level. Conus and cauda equina appear normal. Paraspinal and other soft tissues: Negative. Disc levels: T12-L1: Unremarkable L1-L2: Circumferential disc bulge. Mild bilateral facet degenerative change. No significant spinal canal narrowing. Mild bilateral neural foraminal narrowing. L2-L3: Mild bilateral facet degenerative change. Minimal disc bulge. No significant spinal canal narrowing. Moderate left and mild right neural foraminal narrowing. L3-L4: Mild bilateral facet degenerative change. Circumferential disc bulge. Moderate to severe spinal canal narrowing. Moderate to severe bilateral neural foraminal narrowing. L4-L5: Mild bilateral facet degenerative change. Circumferential disc bulge. Mild spinal canal narrowing. There is narrowing of the bilateral lateral recesses. Moderate to severe bilateral neural foraminal narrowing. L5-S1: Mild  bilateral facet degenerative change. No spinal canal or neural foraminal narrowing. IMPRESSION: Transitional anatomy with sacralization of L5 and a rudimentary disc space at L5-S1. 1. Redemonstrated inferior endplate compression deformity at L4 with persistent bone marrow edema. 2. Moderate to severe spinal canal narrowing at L3-L4 secondary to a combination of a circumferential disc bulge and ligamentum flavum hypertrophy. 3. Moderate to severe bilateral neural foraminal narrowing at L3-L4 and L4-L5. Electronically Signed   By: Lyndall Gore M.D.   On: 10/16/2023 18:42   CT ABDOMEN PELVIS W CONTRAST Result Date: 10/15/2023 CLINICAL DATA:  Acute abdominal pain. History of metastatic cervical cancer. EXAM: CT ABDOMEN AND PELVIS WITH CONTRAST TECHNIQUE: Multidetector CT imaging of the abdomen and pelvis was performed using the standard protocol following bolus administration of intravenous contrast. RADIATION DOSE REDUCTION: This exam was performed according to the departmental dose-optimization program which includes automated exposure control, adjustment of the mA and/or kV according to patient size and/or use of iterative reconstruction technique. CONTRAST:  OMNIPAQUE  IOHEXOL  300 MG/ML  SOLN COMPARISON:  CT chest abdomen and pelvis 09/28/2023. FINDINGS: Lower chest: No acute abnormality. Hepatobiliary: No focal liver abnormality is seen. No gallstones, gallbladder wall thickening, or biliary dilatation. Pancreas: Unremarkable. No pancreatic ductal dilatation or surrounding inflammatory changes. Spleen: Spleen is upper limits of normal in size. Calcified granulomas are present. Adrenals/Urinary Tract: There  is an 8 mm calculus in the inferior pole the right kidney. There is no hydronephrosis or perinephric fat stranding. The adrenal glands are within normal limits. There is mild fat stranding surrounding the bladder. Stomach/Bowel: Stomach is within normal limits. Appendix appears normal. There is no bowel  obstruction, pneumatosis or free air. There some mild rectal wall thickening. There is sigmoid colon diverticulosis. Vascular/Lymphatic: Aortic atherosclerosis. No enlarged abdominal or pelvic lymph nodes. Reproductive: Uterus and bilateral adnexa are unremarkable. Other: Presacral edema and perirectal edema appear unchanged. There is no ascites or focal abdominal wall hernia. Musculoskeletal: There stable compression deformity of the inferior endplate of L5. IMPRESSION: 1. Mild fat stranding surrounding the bladder worrisome for cystitis. 2. Mild rectal wall thickening worrisome for proctitis. 3. Nonobstructing right renal calculus. 4. Sigmoid colon diverticulosis. 5. Stable presacral and perirectal edema. 6. Aortic atherosclerosis. Aortic Atherosclerosis (ICD10-I70.0). Electronically Signed   By: Greig Pique M.D.   On: 10/15/2023 17:06   Scheduled Meds:  Chlorhexidine  Gluconate Cloth  6 each Topical Daily   enoxaparin  (LOVENOX ) injection  40 mg Subcutaneous Q24H   feeding supplement  237 mL Oral BID BM   levothyroxine   50 mcg Oral QAC breakfast   metoCLOPramide  (REGLAN ) injection  10 mg Intravenous Q8H   rosuvastatin   40 mg Oral QHS   saccharomyces boulardii  250 mg Oral BID   sodium chloride  flush  10-40 mL Intracatheter Q12H   sodium chloride  flush  3 mL Intravenous Q12H   thiamine   100 mg Oral Daily   Continuous Infusions:   LOS: 0 days   Time spent:  Elsie JAYSON Montclair, DO [facility]  If 7PM-7AM, please contact night-coverage [email or web address]  10/17/2023, 2:16 PM

## 2023-10-18 ENCOUNTER — Encounter (HOSPITAL_COMMUNITY): Payer: Self-pay | Admitting: Internal Medicine

## 2023-10-18 ENCOUNTER — Inpatient Hospital Stay (HOSPITAL_COMMUNITY): Payer: Medicare PPO

## 2023-10-18 DIAGNOSIS — R6521 Severe sepsis with septic shock: Secondary | ICD-10-CM | POA: Diagnosis not present

## 2023-10-18 DIAGNOSIS — G9341 Metabolic encephalopathy: Secondary | ICD-10-CM | POA: Diagnosis not present

## 2023-10-18 DIAGNOSIS — E876 Hypokalemia: Secondary | ICD-10-CM | POA: Diagnosis not present

## 2023-10-18 DIAGNOSIS — M47816 Spondylosis without myelopathy or radiculopathy, lumbar region: Secondary | ICD-10-CM | POA: Diagnosis not present

## 2023-10-18 DIAGNOSIS — Z66 Do not resuscitate: Secondary | ICD-10-CM | POA: Diagnosis not present

## 2023-10-18 DIAGNOSIS — N1831 Chronic kidney disease, stage 3a: Secondary | ICD-10-CM | POA: Diagnosis present

## 2023-10-18 DIAGNOSIS — B370 Candidal stomatitis: Secondary | ICD-10-CM | POA: Diagnosis present

## 2023-10-18 DIAGNOSIS — F05 Delirium due to known physiological condition: Secondary | ICD-10-CM | POA: Diagnosis not present

## 2023-10-18 DIAGNOSIS — N179 Acute kidney failure, unspecified: Secondary | ICD-10-CM | POA: Diagnosis not present

## 2023-10-18 DIAGNOSIS — R197 Diarrhea, unspecified: Secondary | ICD-10-CM | POA: Diagnosis not present

## 2023-10-18 DIAGNOSIS — Z95828 Presence of other vascular implants and grafts: Secondary | ICD-10-CM | POA: Diagnosis not present

## 2023-10-18 DIAGNOSIS — R627 Adult failure to thrive: Secondary | ICD-10-CM | POA: Diagnosis present

## 2023-10-18 DIAGNOSIS — E1122 Type 2 diabetes mellitus with diabetic chronic kidney disease: Secondary | ICD-10-CM | POA: Diagnosis present

## 2023-10-18 DIAGNOSIS — Z1152 Encounter for screening for COVID-19: Secondary | ICD-10-CM | POA: Diagnosis not present

## 2023-10-18 DIAGNOSIS — Z6834 Body mass index (BMI) 34.0-34.9, adult: Secondary | ICD-10-CM | POA: Diagnosis not present

## 2023-10-18 DIAGNOSIS — I129 Hypertensive chronic kidney disease with stage 1 through stage 4 chronic kidney disease, or unspecified chronic kidney disease: Secondary | ICD-10-CM | POA: Diagnosis present

## 2023-10-18 DIAGNOSIS — R4182 Altered mental status, unspecified: Secondary | ICD-10-CM | POA: Diagnosis not present

## 2023-10-18 DIAGNOSIS — R109 Unspecified abdominal pain: Secondary | ICD-10-CM | POA: Diagnosis not present

## 2023-10-18 DIAGNOSIS — J690 Pneumonitis due to inhalation of food and vomit: Secondary | ICD-10-CM | POA: Diagnosis not present

## 2023-10-18 DIAGNOSIS — K5730 Diverticulosis of large intestine without perforation or abscess without bleeding: Secondary | ICD-10-CM | POA: Diagnosis not present

## 2023-10-18 DIAGNOSIS — E039 Hypothyroidism, unspecified: Secondary | ICD-10-CM | POA: Diagnosis present

## 2023-10-18 DIAGNOSIS — A419 Sepsis, unspecified organism: Secondary | ICD-10-CM | POA: Diagnosis not present

## 2023-10-18 DIAGNOSIS — E871 Hypo-osmolality and hyponatremia: Secondary | ICD-10-CM | POA: Diagnosis not present

## 2023-10-18 DIAGNOSIS — Z515 Encounter for palliative care: Secondary | ICD-10-CM | POA: Diagnosis not present

## 2023-10-18 DIAGNOSIS — D519 Vitamin B12 deficiency anemia, unspecified: Secondary | ICD-10-CM | POA: Diagnosis present

## 2023-10-18 DIAGNOSIS — D849 Immunodeficiency, unspecified: Secondary | ICD-10-CM | POA: Diagnosis present

## 2023-10-18 DIAGNOSIS — E440 Moderate protein-calorie malnutrition: Secondary | ICD-10-CM | POA: Diagnosis present

## 2023-10-18 DIAGNOSIS — C799 Secondary malignant neoplasm of unspecified site: Secondary | ICD-10-CM | POA: Diagnosis present

## 2023-10-18 DIAGNOSIS — N1832 Chronic kidney disease, stage 3b: Secondary | ICD-10-CM | POA: Diagnosis not present

## 2023-10-18 DIAGNOSIS — R918 Other nonspecific abnormal finding of lung field: Secondary | ICD-10-CM | POA: Diagnosis not present

## 2023-10-18 DIAGNOSIS — E872 Acidosis, unspecified: Secondary | ICD-10-CM | POA: Diagnosis not present

## 2023-10-18 DIAGNOSIS — R140 Abdominal distension (gaseous): Secondary | ICD-10-CM | POA: Diagnosis not present

## 2023-10-18 DIAGNOSIS — R112 Nausea with vomiting, unspecified: Secondary | ICD-10-CM | POA: Diagnosis present

## 2023-10-18 DIAGNOSIS — M4856XA Collapsed vertebra, not elsewhere classified, lumbar region, initial encounter for fracture: Secondary | ICD-10-CM | POA: Diagnosis present

## 2023-10-18 DIAGNOSIS — E119 Type 2 diabetes mellitus without complications: Secondary | ICD-10-CM | POA: Diagnosis not present

## 2023-10-18 DIAGNOSIS — Z4682 Encounter for fitting and adjustment of non-vascular catheter: Secondary | ICD-10-CM | POA: Diagnosis not present

## 2023-10-18 DIAGNOSIS — C539 Malignant neoplasm of cervix uteri, unspecified: Secondary | ICD-10-CM | POA: Diagnosis present

## 2023-10-18 DIAGNOSIS — N200 Calculus of kidney: Secondary | ICD-10-CM | POA: Diagnosis not present

## 2023-10-18 DIAGNOSIS — R609 Edema, unspecified: Secondary | ICD-10-CM | POA: Diagnosis not present

## 2023-10-18 DIAGNOSIS — J9601 Acute respiratory failure with hypoxia: Secondary | ICD-10-CM | POA: Diagnosis not present

## 2023-10-18 MED ORDER — LACTATED RINGERS IV SOLN: INTRAVENOUS | Status: DC

## 2023-10-18 MED ORDER — POLYETHYLENE GLYCOL 3350 17 G PO PACK
17.0000 g | PACK | Freq: Two times a day (BID) | ORAL | Status: DC
Start: 2023-10-18 — End: 2023-10-20
  Administered 2023-10-18: 17 g via ORAL
  Filled 2023-10-18 (×3): qty 1

## 2023-10-18 MED ORDER — SENNOSIDES-DOCUSATE SODIUM 8.6-50 MG PO TABS
2.0000 | ORAL_TABLET | Freq: Two times a day (BID) | ORAL | Status: DC
  Administered 2023-10-18: 2 via ORAL
  Filled 2023-10-18 (×2): qty 2

## 2023-10-18 MED ORDER — BISACODYL 10 MG RE SUPP
10.0000 mg | Freq: Once | RECTAL | Status: AC
  Administered 2023-10-19: 10 mg via RECTAL
  Filled 2023-10-18: qty 1

## 2023-10-18 MED ORDER — GADOBUTROL 1 MMOL/ML IV SOLN
8.0000 mL | Freq: Once | INTRAVENOUS | Status: AC | PRN
  Administered 2023-10-18: 8 mL via INTRAVENOUS

## 2023-10-18 MED ORDER — HYDROCODONE-ACETAMINOPHEN 5-325 MG PO TABS
1.0000 | ORAL_TABLET | ORAL | Status: DC | PRN
  Filled 2023-10-18: qty 1

## 2023-10-18 MED ORDER — ENSURE ENLIVE PO LIQD: 237.0000 mL | Freq: Three times a day (TID) | ORAL | Status: DC

## 2023-10-18 NOTE — Plan of Care (Signed)

## 2023-10-18 NOTE — Plan of Care (Addendum)
 IR was requested for image guided KP.   Case was reviewed by Dr. everitt Nile Erichsen, there is a persistent edema in the L5 vertebral body with fracture despite the fx is chronic since June 2024. With patient's hx of CA, recommend MR L spine with contrast for further eval, patient might be a candidate for bone lesion bx, Osteocool (bone lesion ablation,) and KP of the L5.    Ordering provider notified. The Osteocool/KP requires insurance approval which typically takes couple business days.  Will review MR L spine with once performed.   Please call IR for questions and concerns.   Alyria Krack H Revan Gendron PA-C 10/18/2023 3:55 PM

## 2023-10-18 NOTE — Progress Notes (Signed)
 MEWS 2. Messaged Misty Deleon.

## 2023-10-18 NOTE — Progress Notes (Signed)
 The patient had a vomiting incident and nausea today, therefore, was unable to drink miralax . The patient's husband believes that the patient is not eating due to not having a bowel movement since Sunday. Patient is currently confused also. Her husband is also concerned that she may be dehydrated due to the N&V and is requesting IV fluid.  Messaged Abigail Chavez

## 2023-10-18 NOTE — Progress Notes (Signed)
 PROGRESS NOTE    Misty Deleon  FMW:[id number] DOB: April 22, 1951 DOA: 10/15/2023 PCP: Salina Clarity, NP   Brief Narrative:  73 y.o. female with medical history significant for cervical cancer on maintenance bevacizumab , T2DM, CKD stage IIIa, HTN, HLD, hypothyroidism, anemia due to B12 deficiency, recent C. difficile infection who presented to the ED for evaluation of persistent nausea, vomiting, and diarrhea.  Patient was previously admitted late October 2024 with acute pyelonephritis treated with cephalosporins. She was readmitted shortly after at the beginning of November for C. difficile diarrhea. She was treated with oral vancomycin .    CT abdomen/pelvis with contrast showed mild fat stranding surrounding the bladder, mild rectal wall thickening worrisome for proctitis. Nonobstructing right renal calculus, sigmoid colon diverticulosis, stable presacral and perirectal edema.   Assessment & Plan:   Principal Problem:   Nausea, vomiting, and diarrhea Active Problems:   Cervical cancer (HCC)   Chronic kidney disease, stage 3a (HCC)   Type 2 diabetes mellitus (HCC)   Vitamin B12 deficiency   Essential hypertension   Hypokalemia   Hypothyroidism   Failure to thrive in adult  Nausea/vomiting/diarrhea/dysgeusia Rule out delayed gastric emptying/gastroparesis Rule out constipation - Insidious onset over the last couple months, continue to advance diet as tolerated - CT abdomen pelvis shows mild rectal thickening without overt obstruction -in the setting of previous cancer and radiation - No real improvement despite metoclopramide , antiemetics - Gastric emptying study pending - Decrease narcotic burden given concern for gastric motility issues - Stool studies ordered but not obtained due to lack of bowel movement, canceled - Add Senokot/MiraLAX  in the setting of impending constipation complicated by narcotic use - Increase meal supplementation ACHS  Hypokalemia: - Stable, no longer  following   Low back pain, chronic: - Follows with [facility], MRI performed while in hospital showing endplate compression at L4, moderate to severe spinal canal narrowing at L3-4 and foraminal narrowing at L3-4 and L4-5 - Plan for outpatient follow-up after discharge -no focal paresthesias or weakness that would indicate urgent evaluation - IR asked to evaluate imaging for appropriateness of possible kyphoplasty - I do not feel a kyphoplasty would greatly improve her quality of life or pain burden but will defer to IR/Ortho in this regard - Decrease narcotic burden given reported mental status changes/somnolence  CKD stage IIIa: Renal function stable.  Continue to monitor. Cervical cancer: Follows with Dr. Lonn fusi bevacizumab .  In remission. Type 2 diabetes:, Well-controlled -hold p.o. medications in the interim -restart at discharge  Hypertension: Blood pressure currently well-controlled off home medications. Hyperlipidemia: Continue rosuvastatin . Hypothyroidism: Continue Synthroid . Anemia/B12 deficiency: Continue supplementation  DVT prophylaxis: enoxaparin  (LOVENOX ) injection 40 mg Start: 10/15/23 2200 Code Status:   Code Status: Full Code Family Communication: Husband at bedside  Status is: Inpatient  Dispo: The patient is from: Home              Anticipated d/c is to: To be determined -likely SNF              Anticipated d/c date is: 24 to 48 hours              Patient currently not medically stable for discharge  Consultants:  Oncology (IR/Ortho sidelined in regards to back pain/spinal narrowing)  Procedures:  None  Antimicrobials:  None  Subjective: Noted increased somnolence and questionable hallucinations overnight -patient still notes no BM since admission, denies abdominal pain or distention but nausea occasional vomiting ongoing.  Otherwise denies headache fevers chills chest pain shortness of  breath.  Objective: Vitals:   10/17/23 1708 10/17/23 1720  10/17/23 2231 10/18/23 0627  BP: 99/81 113/60 102/64 101/67  Pulse: (!) 119 88 (!) 105 (!) 105  Resp:   17 16  Temp:   98.6 F (37 C) 98.1 F (36.7 C)  TempSrc:   Oral Oral  SpO2:   96% 100%  Weight:      Height:        Intake/Output Summary (Last 24 hours) at 10/18/2023 1212 Last data filed at 10/17/2023 1600 Gross per 24 hour  Intake 200 ml  Output --  Net 200 ml   Filed Weights   10/15/23 1151 10/17/23 0500  Weight: 83.5 kg 84.9 kg    Examination:  General:  Pleasantly resting in bed, No acute distress. HEENT:  Normocephalic atraumatic.  Sclerae nonicteric, noninjected.  Extraocular movements intact bilaterally. Neck:  Without mass or deformity.  Trachea is midline. Lungs:  Clear to auscultate bilaterally without rhonchi, wheeze, or rales. Heart:  Regular rate and rhythm.  Without murmurs, rubs, or gallops. Abdomen:  Soft, nontender, nondistended.  Without guarding or rebound. Extremities: Without cyanosis, clubbing, edema, or obvious deformity. Skin:  Warm and dry, no erythema.   Data Reviewed: I have personally reviewed following labs and imaging studies  CBC: Recent Labs  Lab 10/12/23 1828 10/15/23 1358 10/16/23 0818  WBC 4.3 4.4 2.9*  NEUTROABS  --  2.3  --   HGB 10.2* 10.6* 9.2*  HCT 30.6* 32.3* 28.9*  MCV 98.1 100.3* 101.0*  PLT 181 179 145*   Basic Metabolic Panel: Recent Labs  Lab 10/12/23 1828 10/15/23 1358 10/16/23 0229  NA 137 134* 137  K 3.7 3.3* 3.6  CL 103 102 102  CO2 25 20* 26  GLUCOSE 113* 80 95  BUN 11 12 12   CREATININE 1.11* 1.06* 0.86  CALCIUM  9.9 9.6 8.8*  MG  --  1.4* 2.0   GFR: Estimated Creatinine Clearance: 59.3 mL/min (by C-G formula based on SCr of 0.86 mg/dL). Liver Function Tests: Recent Labs  Lab 10/12/23 1828 10/15/23 1358 10/16/23 0229  AST 22 23 20   ALT 8 10 18   ALKPHOS 70 57 92  BILITOT 0.8 1.1 0.5  PROT 6.1* 6.0* 6.0*  ALBUMIN  3.2* 3.3* 2.7*   Recent Labs  Lab 10/12/23 1828 10/15/23 1358  LIPASE  34 34    Recent Results (from the past 240 hours)  Resp panel by RT-PCR (RSV, Flu A&B, Covid) Anterior Nasal Swab     Status: None   Collection Time: 10/12/23  8:07 PM   Specimen: Anterior Nasal Swab  Result Value Ref Range Status   SARS Coronavirus 2 by RT PCR NEGATIVE NEGATIVE Final    Comment: (NOTE) SARS-CoV-2 target nucleic acids are NOT DETECTED.  The SARS-CoV-2 RNA is generally detectable in upper respiratory specimens during the acute phase of infection. The lowest concentration of SARS-CoV-2 viral copies this assay can detect is 138 copies/mL. A negative result does not preclude SARS-Cov-2 infection and should not be used as the sole basis for treatment or other patient management decisions. A negative result may occur with  improper specimen collection/handling, submission of specimen other than nasopharyngeal swab, presence of viral mutation(s) within the areas targeted by this assay, and inadequate number of viral copies(<138 copies/mL). A negative result must be combined with clinical observations, patient history, and epidemiological information. The expected result is Negative.  Fact Sheet for Patients:  bloggercourse.com  Fact Sheet for Healthcare Providers:  seriousbroker.it  This test is  no t yet approved or cleared by the [place]  FDA and  has been authorized for detection and/or diagnosis of SARS-CoV-2 by FDA under an Emergency Use Authorization (EUA). This EUA will remain  in effect (meaning this test can be used) for the duration of the COVID-19 declaration under Section 564(b)(1) of the Act, 21 U.S.C.section 360bbb-3(b)(1), unless the authorization is terminated  or revoked sooner.       Influenza A by PCR NEGATIVE NEGATIVE Final   Influenza B by PCR NEGATIVE NEGATIVE Final    Comment: (NOTE) The Xpert Xpress SARS-CoV-2/FLU/RSV plus assay is intended as an aid in the diagnosis of influenza from  Nasopharyngeal swab specimens and should not be used as a sole basis for treatment. Nasal washings and aspirates are unacceptable for Xpert Xpress SARS-CoV-2/FLU/RSV testing.  Fact Sheet for Patients: bloggercourse.com  Fact Sheet for Healthcare Providers: seriousbroker.it  This test is not yet approved or cleared by the [place]  FDA and has been authorized for detection and/or diagnosis of SARS-CoV-2 by FDA under an Emergency Use Authorization (EUA). This EUA will remain in effect (meaning this test can be used) for the duration of the COVID-19 declaration under Section 564(b)(1) of the Act, 21 U.S.C. section 360bbb-3(b)(1), unless the authorization is terminated or revoked.     Resp Syncytial Virus by PCR NEGATIVE NEGATIVE Final    Comment: (NOTE) Fact Sheet for Patients: bloggercourse.com  Fact Sheet for Healthcare Providers: seriousbroker.it  This test is not yet approved or cleared by the [place]  FDA and has been authorized for detection and/or diagnosis of SARS-CoV-2 by FDA under an Emergency Use Authorization (EUA). This EUA will remain in effect (meaning this test can be used) for the duration of the COVID-19 declaration under Section 564(b)(1) of the Act, 21 U.S.C. section 360bbb-3(b)(1), unless the authorization is terminated or revoked.  Performed at [facility], [address] [place], [place] [address]   Resp panel by RT-PCR (RSV, Flu A&B, Covid) Anterior Nasal Swab     Status: None   Collection Time: 10/15/23 10:18 PM   Specimen: Anterior Nasal Swab  Result Value Ref Range Status   SARS Coronavirus 2 by RT PCR NEGATIVE NEGATIVE Final    Comment: (NOTE) SARS-CoV-2 target nucleic acids are NOT DETECTED.  The SARS-CoV-2 RNA is generally detectable in upper respiratory specimens during the acute phase of infection. The  lowest concentration of SARS-CoV-2 viral copies this assay can detect is 138 copies/mL. A negative result does not preclude SARS-Cov-2 infection and should not be used as the sole basis for treatment or other patient management decisions. A negative result may occur with  improper specimen collection/handling, submission of specimen other than nasopharyngeal swab, presence of viral mutation(s) within the areas targeted by this assay, and inadequate number of viral copies(<138 copies/mL). A negative result must be combined with clinical observations, patient history, and epidemiological information. The expected result is Negative.  Fact Sheet for Patients:  bloggercourse.com  Fact Sheet for Healthcare Providers:  seriousbroker.it  This test is no t yet approved or cleared by the [place]  FDA and  has been authorized for detection and/or diagnosis of SARS-CoV-2 by FDA under an Emergency Use Authorization (EUA). This EUA will remain  in effect (meaning this test can be used) for the duration of the COVID-19 declaration under Section 564(b)(1) of the Act, 21 U.S.C.section 360bbb-3(b)(1), unless the authorization is terminated  or revoked sooner.       Influenza A by PCR NEGATIVE NEGATIVE Final  Influenza B by PCR NEGATIVE NEGATIVE Final    Comment: (NOTE) The Xpert Xpress SARS-CoV-2/FLU/RSV plus assay is intended as an aid in the diagnosis of influenza from Nasopharyngeal swab specimens and should not be used as a sole basis for treatment. Nasal washings and aspirates are unacceptable for Xpert Xpress SARS-CoV-2/FLU/RSV testing.  Fact Sheet for Patients: bloggercourse.com  Fact Sheet for Healthcare Providers: seriousbroker.it  This test is not yet approved or cleared by the [place]  FDA and has been authorized for detection and/or diagnosis of SARS-CoV-2 by FDA under  an Emergency Use Authorization (EUA). This EUA will remain in effect (meaning this test can be used) for the duration of the COVID-19 declaration under Section 564(b)(1) of the Act, 21 U.S.C. section 360bbb-3(b)(1), unless the authorization is terminated or revoked.     Resp Syncytial Virus by PCR NEGATIVE NEGATIVE Final    Comment: (NOTE) Fact Sheet for Patients: bloggercourse.com  Fact Sheet for Healthcare Providers: seriousbroker.it  This test is not yet approved or cleared by the [place]  FDA and has been authorized for detection and/or diagnosis of SARS-CoV-2 by FDA under an Emergency Use Authorization (EUA). This EUA will remain in effect (meaning this test can be used) for the duration of the COVID-19 declaration under Section 564(b)(1) of the Act, 21 U.S.C. section 360bbb-3(b)(1), unless the authorization is terminated or revoked.  Performed at [facility], [address], [place], [place] [address]       Radiology Studies: MR LUMBAR SPINE WO CONTRAST Result Date: 10/16/2023 CLINICAL DATA:  Back paint EXAM: MRI LUMBAR SPINE WITHOUT CONTRAST TECHNIQUE: Multiplanar, multisequence MR imaging of the lumbar spine was performed. No intravenous contrast was administered. COMPARISON:  CT chest abdomen and pelvis 05/17/2023, CT abdomen and pelvis 10/15/2023 FINDINGS: Segmentation: Transitional anatomy with sacralization of L5 and a rudimentary disc space at L5-S1. Alignment:  Physiologic. Vertebrae: Redemonstrated inferior endplate compression deformity at L4 with persistent bone marrow edema. No acute fracture. No evidence of discitis/osteomyelitis. No focal bone lesion. Conus medullaris and cauda equina: Conus extends to the L1 level. Conus and cauda equina appear normal. Paraspinal and other soft tissues: Negative. Disc levels: T12-L1: Unremarkable L1-L2: Circumferential disc bulge. Mild bilateral facet  degenerative change. No significant spinal canal narrowing. Mild bilateral neural foraminal narrowing. L2-L3: Mild bilateral facet degenerative change. Minimal disc bulge. No significant spinal canal narrowing. Moderate left and mild right neural foraminal narrowing. L3-L4: Mild bilateral facet degenerative change. Circumferential disc bulge. Moderate to severe spinal canal narrowing. Moderate to severe bilateral neural foraminal narrowing. L4-L5: Mild bilateral facet degenerative change. Circumferential disc bulge. Mild spinal canal narrowing. There is narrowing of the bilateral lateral recesses. Moderate to severe bilateral neural foraminal narrowing. L5-S1: Mild bilateral facet degenerative change. No spinal canal or neural foraminal narrowing. IMPRESSION: Transitional anatomy with sacralization of L5 and a rudimentary disc space at L5-S1. 1. Redemonstrated inferior endplate compression deformity at L4 with persistent bone marrow edema. 2. Moderate to severe spinal canal narrowing at L3-L4 secondary to a combination of a circumferential disc bulge and ligamentum flavum hypertrophy. 3. Moderate to severe bilateral neural foraminal narrowing at L3-L4 and L4-L5. Electronically Signed   By: Lyndall Gore M.D.   On: 10/16/2023 18:42   Scheduled Meds:  Chlorhexidine  Gluconate Cloth  6 each Topical Daily   enoxaparin  (LOVENOX ) injection  40 mg Subcutaneous Q24H   feeding supplement  237 mL Oral TID AC & HS   levothyroxine   50 mcg Oral QAC breakfast   metoCLOPramide  (REGLAN )  injection  10 mg Intravenous Q8H   polyethylene glycol  17 g Oral BID   rosuvastatin   40 mg Oral QHS   saccharomyces boulardii  250 mg Oral BID   senna-docusate  2 tablet Oral BID   sodium chloride  flush  10-40 mL Intracatheter Q12H   sodium chloride  flush  3 mL Intravenous Q12H   thiamine   100 mg Oral Daily   Continuous Infusions:   LOS: 0 days   Time spent:  Elsie JAYSON Montclair, DO [facility]  If 7PM-7AM,  please contact night-coverage [email or web address]  10/18/2023, 12:12 PM

## 2023-10-18 NOTE — Progress Notes (Deleted)
 PROGRESS NOTE    Misty FOCHT  FMW:[id number] DOB: 02-Sep-1951 DOA: 10/15/2023 PCP: Salina Clarity, NP   Brief Narrative:  73 y.o. female with medical history significant for cervical cancer on maintenance bevacizumab , T2DM, CKD stage IIIa, HTN, HLD, hypothyroidism, anemia due to B12 deficiency, recent C. difficile infection who presented to the ED for evaluation of persistent nausea, vomiting, and diarrhea.  Patient was previously admitted late October 2024 with acute pyelonephritis treated with cephalosporins. She was readmitted shortly after at the beginning of November for C. difficile diarrhea. She was treated with oral vancomycin .    CT abdomen/pelvis with contrast showed mild fat stranding surrounding the bladder, mild rectal wall thickening worrisome for proctitis. Nonobstructing right renal calculus, sigmoid colon diverticulosis, stable presacral and perirectal edema.   Assessment & Plan:   Principal Problem:   Nausea, vomiting, and diarrhea Active Problems:   Cervical cancer (HCC)   Chronic kidney disease, stage 3a (HCC)   Type 2 diabetes mellitus (HCC)   Vitamin B12 deficiency   Essential hypertension   Hypokalemia   Hypothyroidism   Nausea/vomiting/diarrhea/dysgeusia Rule out delayed gastric emptying/gastroparesis Rule out constipation -Insidious onset over the last couple months, continue to advance diet as tolerated -CT abdomen pelvis shows mild rectal thickening without overt obstruction -in the setting of previous cancer and radiation -No real improvement despite metoclopramide , antiemetics - Gastric emptying study pending -Decrease narcotic burden given concern for gastric motility issues -Stool studies ordered but not obtained due to lack of bowel movement, canceled -Add Senokot/MiraLAX  in the setting of impending constipation complicated by narcotic use -Increase meal supplementation ACHS  Hypokalemia: Stable, no longer following   Low back pain,  chronic: -Follows with [facility], MRI performed while in hospital showing endplate compression at L4, moderate to severe spinal canal narrowing at L3-4 and foraminal narrowing at L3-4 and L4-5 -Plan for outpatient follow-up after discharge -no focal paresthesias or weakness that would indicate urgent evaluation -Decrease narcotic burden given reported mental status changes/somnolence  CKD stage IIIa: Renal function stable.  Continue to monitor. Cervical cancer: Follows with Dr. Lonn fusi bevacizumab .  In remission. Type 2 diabetes:, Well-controlled -hold p.o. medications in the interim -restart at discharge  Hypertension: Blood pressure currently well-controlled off home medications. Hyperlipidemia: Continue rosuvastatin . Hypothyroidism: Continue Synthroid . Anemia/B12 deficiency: Continue supplementation  DVT prophylaxis: enoxaparin  (LOVENOX ) injection 40 mg Start: 10/15/23 2200 Code Status:   Code Status: Full Code Family Communication: Husband at bedside  Status is: Inpatient  Dispo: The patient is from: Home              Anticipated d/c is to: To be determined -likely SNF              Anticipated d/c date is: 24 to 48 hours              Patient currently not medically stable for discharge  Consultants:  Oncology  Procedures:  None  Antimicrobials:  None  Subjective: Noted increased somnolence and questionable hallucinations overnight -patient still notes no BM since admission, denies abdominal pain or distention but nausea occasional vomiting ongoing.  Otherwise denies headache fevers chills chest pain shortness of breath.  Objective: Vitals:   10/17/23 1708 10/17/23 1720 10/17/23 2231 10/18/23 0627  BP: 99/81 113/60 102/64 101/67  Pulse: (!) 119 88 (!) 105 (!) 105  Resp:   17 16  Temp:   98.6 F (37 C) 98.1 F (36.7 C)  TempSrc:   Oral Oral  SpO2:   96% 100%  Weight:  Height:        Intake/Output Summary (Last 24 hours) at 10/18/2023 0716 Last data filed  at 10/17/2023 1600 Gross per 24 hour  Intake 400 ml  Output 275 ml  Net 125 ml   Filed Weights   10/15/23 1151 10/17/23 0500  Weight: 83.5 kg 84.9 kg    Examination:  General:  Pleasantly resting in bed, No acute distress. HEENT:  Normocephalic atraumatic.  Sclerae nonicteric, noninjected.  Extraocular movements intact bilaterally. Neck:  Without mass or deformity.  Trachea is midline. Lungs:  Clear to auscultate bilaterally without rhonchi, wheeze, or rales. Heart:  Regular rate and rhythm.  Without murmurs, rubs, or gallops. Abdomen:  Soft, nontender, nondistended.  Without guarding or rebound. Extremities: Without cyanosis, clubbing, edema, or obvious deformity. Skin:  Warm and dry, no erythema.   Data Reviewed: I have personally reviewed following labs and imaging studies  CBC: Recent Labs  Lab 10/12/23 1828 10/15/23 1358 10/16/23 0818  WBC 4.3 4.4 2.9*  NEUTROABS  --  2.3  --   HGB 10.2* 10.6* 9.2*  HCT 30.6* 32.3* 28.9*  MCV 98.1 100.3* 101.0*  PLT 181 179 145*   Basic Metabolic Panel: Recent Labs  Lab 10/12/23 1828 10/15/23 1358 10/16/23 0229  NA 137 134* 137  K 3.7 3.3* 3.6  CL 103 102 102  CO2 25 20* 26  GLUCOSE 113* 80 95  BUN 11 12 12   CREATININE 1.11* 1.06* 0.86  CALCIUM  9.9 9.6 8.8*  MG  --  1.4* 2.0   GFR: Estimated Creatinine Clearance: 59.3 mL/min (by C-G formula based on SCr of 0.86 mg/dL). Liver Function Tests: Recent Labs  Lab 10/12/23 1828 10/15/23 1358 10/16/23 0229  AST 22 23 20   ALT 8 10 18   ALKPHOS 70 57 92  BILITOT 0.8 1.1 0.5  PROT 6.1* 6.0* 6.0*  ALBUMIN  3.2* 3.3* 2.7*   Recent Labs  Lab 10/12/23 1828 10/15/23 1358  LIPASE 34 34    Recent Results (from the past 240 hours)  Resp panel by RT-PCR (RSV, Flu A&B, Covid) Anterior Nasal Swab     Status: None   Collection Time: 10/12/23  8:07 PM   Specimen: Anterior Nasal Swab  Result Value Ref Range Status   SARS Coronavirus 2 by RT PCR NEGATIVE NEGATIVE Final     Comment: (NOTE) SARS-CoV-2 target nucleic acids are NOT DETECTED.  The SARS-CoV-2 RNA is generally detectable in upper respiratory specimens during the acute phase of infection. The lowest concentration of SARS-CoV-2 viral copies this assay can detect is 138 copies/mL. A negative result does not preclude SARS-Cov-2 infection and should not be used as the sole basis for treatment or other patient management decisions. A negative result may occur with  improper specimen collection/handling, submission of specimen other than nasopharyngeal swab, presence of viral mutation(s) within the areas targeted by this assay, and inadequate number of viral copies(<138 copies/mL). A negative result must be combined with clinical observations, patient history, and epidemiological information. The expected result is Negative.  Fact Sheet for Patients:  bloggercourse.com  Fact Sheet for Healthcare Providers:  seriousbroker.it  This test is no t yet approved or cleared by the [place]  FDA and  has been authorized for detection and/or diagnosis of SARS-CoV-2 by FDA under an Emergency Use Authorization (EUA). This EUA will remain  in effect (meaning this test can be used) for the duration of the COVID-19 declaration under Section 564(b)(1) of the Act, 21 U.S.C.section 360bbb-3(b)(1), unless the authorization is terminated  or revoked sooner.       Influenza A by PCR NEGATIVE NEGATIVE Final   Influenza B by PCR NEGATIVE NEGATIVE Final    Comment: (NOTE) The Xpert Xpress SARS-CoV-2/FLU/RSV plus assay is intended as an aid in the diagnosis of influenza from Nasopharyngeal swab specimens and should not be used as a sole basis for treatment. Nasal washings and aspirates are unacceptable for Xpert Xpress SARS-CoV-2/FLU/RSV testing.  Fact Sheet for Patients: bloggercourse.com  Fact Sheet for Healthcare  Providers: seriousbroker.it  This test is not yet approved or cleared by the [place]  FDA and has been authorized for detection and/or diagnosis of SARS-CoV-2 by FDA under an Emergency Use Authorization (EUA). This EUA will remain in effect (meaning this test can be used) for the duration of the COVID-19 declaration under Section 564(b)(1) of the Act, 21 U.S.C. section 360bbb-3(b)(1), unless the authorization is terminated or revoked.     Resp Syncytial Virus by PCR NEGATIVE NEGATIVE Final    Comment: (NOTE) Fact Sheet for Patients: bloggercourse.com  Fact Sheet for Healthcare Providers: seriousbroker.it  This test is not yet approved or cleared by the [place]  FDA and has been authorized for detection and/or diagnosis of SARS-CoV-2 by FDA under an Emergency Use Authorization (EUA). This EUA will remain in effect (meaning this test can be used) for the duration of the COVID-19 declaration under Section 564(b)(1) of the Act, 21 U.S.C. section 360bbb-3(b)(1), unless the authorization is terminated or revoked.  Performed at [facility], [address], [place], [place] [address]   Resp panel by RT-PCR (RSV, Flu A&B, Covid) Anterior Nasal Swab     Status: None   Collection Time: 10/15/23 10:18 PM   Specimen: Anterior Nasal Swab  Result Value Ref Range Status   SARS Coronavirus 2 by RT PCR NEGATIVE NEGATIVE Final    Comment: (NOTE) SARS-CoV-2 target nucleic acids are NOT DETECTED.  The SARS-CoV-2 RNA is generally detectable in upper respiratory specimens during the acute phase of infection. The lowest concentration of SARS-CoV-2 viral copies this assay can detect is 138 copies/mL. A negative result does not preclude SARS-Cov-2 infection and should not be used as the sole basis for treatment or other patient management decisions. A negative result may occur with  improper  specimen collection/handling, submission of specimen other than nasopharyngeal swab, presence of viral mutation(s) within the areas targeted by this assay, and inadequate number of viral copies(<138 copies/mL). A negative result must be combined with clinical observations, patient history, and epidemiological information. The expected result is Negative.  Fact Sheet for Patients:  bloggercourse.com  Fact Sheet for Healthcare Providers:  seriousbroker.it  This test is no t yet approved or cleared by the [place]  FDA and  has been authorized for detection and/or diagnosis of SARS-CoV-2 by FDA under an Emergency Use Authorization (EUA). This EUA will remain  in effect (meaning this test can be used) for the duration of the COVID-19 declaration under Section 564(b)(1) of the Act, 21 U.S.C.section 360bbb-3(b)(1), unless the authorization is terminated  or revoked sooner.       Influenza A by PCR NEGATIVE NEGATIVE Final   Influenza B by PCR NEGATIVE NEGATIVE Final    Comment: (NOTE) The Xpert Xpress SARS-CoV-2/FLU/RSV plus assay is intended as an aid in the diagnosis of influenza from Nasopharyngeal swab specimens and should not be used as a sole basis for treatment. Nasal washings and aspirates are unacceptable for Xpert Xpress SARS-CoV-2/FLU/RSV testing.  Fact Sheet for Patients: bloggercourse.com  Fact Sheet  for Healthcare Providers: seriousbroker.it  This test is not yet approved or cleared by the [place]  FDA and has been authorized for detection and/or diagnosis of SARS-CoV-2 by FDA under an Emergency Use Authorization (EUA). This EUA will remain in effect (meaning this test can be used) for the duration of the COVID-19 declaration under Section 564(b)(1) of the Act, 21 U.S.C. section 360bbb-3(b)(1), unless the authorization is terminated or revoked.     Resp  Syncytial Virus by PCR NEGATIVE NEGATIVE Final    Comment: (NOTE) Fact Sheet for Patients: bloggercourse.com  Fact Sheet for Healthcare Providers: seriousbroker.it  This test is not yet approved or cleared by the [place]  FDA and has been authorized for detection and/or diagnosis of SARS-CoV-2 by FDA under an Emergency Use Authorization (EUA). This EUA will remain in effect (meaning this test can be used) for the duration of the COVID-19 declaration under Section 564(b)(1) of the Act, 21 U.S.C. section 360bbb-3(b)(1), unless the authorization is terminated or revoked.  Performed at [facility], [address] [place], [place] [address]       Radiology Studies: MR LUMBAR SPINE WO CONTRAST Result Date: 10/16/2023 CLINICAL DATA:  Back paint EXAM: MRI LUMBAR SPINE WITHOUT CONTRAST TECHNIQUE: Multiplanar, multisequence MR imaging of the lumbar spine was performed. No intravenous contrast was administered. COMPARISON:  CT chest abdomen and pelvis 05/17/2023, CT abdomen and pelvis 10/15/2023 FINDINGS: Segmentation: Transitional anatomy with sacralization of L5 and a rudimentary disc space at L5-S1. Alignment:  Physiologic. Vertebrae: Redemonstrated inferior endplate compression deformity at L4 with persistent bone marrow edema. No acute fracture. No evidence of discitis/osteomyelitis. No focal bone lesion. Conus medullaris and cauda equina: Conus extends to the L1 level. Conus and cauda equina appear normal. Paraspinal and other soft tissues: Negative. Disc levels: T12-L1: Unremarkable L1-L2: Circumferential disc bulge. Mild bilateral facet degenerative change. No significant spinal canal narrowing. Mild bilateral neural foraminal narrowing. L2-L3: Mild bilateral facet degenerative change. Minimal disc bulge. No significant spinal canal narrowing. Moderate left and mild right neural foraminal narrowing. L3-L4: Mild bilateral  facet degenerative change. Circumferential disc bulge. Moderate to severe spinal canal narrowing. Moderate to severe bilateral neural foraminal narrowing. L4-L5: Mild bilateral facet degenerative change. Circumferential disc bulge. Mild spinal canal narrowing. There is narrowing of the bilateral lateral recesses. Moderate to severe bilateral neural foraminal narrowing. L5-S1: Mild bilateral facet degenerative change. No spinal canal or neural foraminal narrowing. IMPRESSION: Transitional anatomy with sacralization of L5 and a rudimentary disc space at L5-S1. 1. Redemonstrated inferior endplate compression deformity at L4 with persistent bone marrow edema. 2. Moderate to severe spinal canal narrowing at L3-L4 secondary to a combination of a circumferential disc bulge and ligamentum flavum hypertrophy. 3. Moderate to severe bilateral neural foraminal narrowing at L3-L4 and L4-L5. Electronically Signed   By: Lyndall Gore M.D.   On: 10/16/2023 18:42   Scheduled Meds:  Chlorhexidine  Gluconate Cloth  6 each Topical Daily   enoxaparin  (LOVENOX ) injection  40 mg Subcutaneous Q24H   feeding supplement  237 mL Oral BID BM   levothyroxine   50 mcg Oral QAC breakfast   metoCLOPramide  (REGLAN ) injection  10 mg Intravenous Q8H   rosuvastatin   40 mg Oral QHS   saccharomyces boulardii  250 mg Oral BID   sodium chloride  flush  10-40 mL Intracatheter Q12H   sodium chloride  flush  3 mL Intravenous Q12H   thiamine   100 mg Oral Daily   Continuous Infusions:   LOS: 0 days   Time spent:  Elsie JAYSON Montclair, DO [facility]  If 7PM-7AM, please contact night-coverage [email or web address]  10/18/2023, 7:16 AM

## 2023-10-18 NOTE — Progress Notes (Signed)
 Physical Therapy Treatment Patient Details Name: Misty Deleon MRN: [id number] DOB: [birth year] Today's Date: 10/18/2023   History of Present Illness Patient is a 73 year old female who presented on 2/3 with nausea. Patient was admitted with hypokalemia, low back pain,and dysgeusia. PMH:L5 compression fracture per ortho visit on 12/3 prior to ED visit, cervical cancer,CKD, anemia, HTN, HLD, b12 deficiency, DM II.    PT Comments  Pt sitting in chair when PT arrived. She is pleasant and greets therapist. She is able to come to standing with increased time, cues for hand placement, and min A for transfer. Once standing, pt is able to amb in the hallway with CGA-SBA x65ft. Pt needs cues to maintain gait line x3 episodes. Once back to the room, pt sits EOB, completes slouch overcorrect in sitting with good form and ROM demonstrated. Pt became increasingly tired, husband reports medication administered prior to PT. Pt returned to bed with callbell and family in room. Nursing notified of BP taken back in supine 102/53 109 bpm.    If plan is discharge home, recommend the following: A little help with walking and/or transfers;Assist for transportation;Assistance with cooking/housework;Help with stairs or ramp for entrance   Can travel by private vehicle        Equipment Recommendations  None recommended by PT    Recommendations for Other Services       Precautions / Restrictions Precautions Precautions: Fall Precaution Comments: back precautions- L5 compression Required Braces or Orthoses: Other Brace Other Brace: corset in room, not applied Restrictions Weight Bearing Restrictions Per Provider Order: No     Mobility  Bed Mobility Overal bed mobility: Needs Assistance Bed Mobility: Rolling, Sit to Sidelying Rolling: Min assist       Sit to sidelying: Min assist General bed mobility comments: LE management    Transfers Overall transfer level: Needs assistance Equipment used:  Rolling walker (2 wheels) Transfers: Sit to/from Stand Sit to Stand: Contact guard assist           General transfer comment: use of RW for support    Ambulation/Gait Ambulation/Gait assistance: Contact guard assist Gait Distance (Feet): 75 Feet Assistive device: Rolling walker (2 wheels) Gait Pattern/deviations: Step-through pattern, Decreased stride length, Decreased step length - left, Decreased step length - right Gait velocity: decr     General Gait Details: gait is slow, antalgic to the right, cues to maintain gait line   Stairs             Wheelchair Mobility     Tilt Bed    Modified Rankin (Stroke Patients Only)       Balance Overall balance assessment: Needs assistance Sitting-balance support: Feet unsupported Sitting balance-Leahy Scale: Fair     Standing balance support: Reliant on assistive device for balance, Bilateral upper extremity supported Standing balance-Leahy Scale: Fair                              Cognition Arousal: Alert, Lethargic (Pt alert, introduces herself at beginning of session. As she fatigues she becomes more drowsy and lethargic.) Behavior During Therapy: WFL for tasks assessed/performed Overall Cognitive Status: Within Functional Limits for tasks assessed                                 General Comments: husband was present as well.        Exercises Other Exercises  Other Exercises: slouch overcorrect x10 reps q2hrs in sitting Other Exercises: Sciatic nn glide x15 reps q2hrs in sitting    General Comments        Pertinent Vitals/Pain Pain Assessment Pain Assessment: Faces Faces Pain Scale: Hurts a little bit Pain Location: lumbar spine Pain Descriptors / Indicators: Discomfort, Constant, Tightness Pain Intervention(s): Limited activity within patient's tolerance, Monitored during session, Repositioned    Home Living                          Prior Function             PT Goals (current goals can now be found in the care plan section) Acute Rehab PT Goals Patient Stated Goal: to not have pain PT Goal Formulation: With patient/family Time For Goal Achievement: 10/30/23 Potential to Achieve Goals: Fair Progress towards PT goals: Progressing toward goals    Frequency    Min 1X/week      PT Plan      Co-evaluation              AM-PAC PT 6 Clicks Mobility   Outcome Measure  Help needed turning from your back to your side while in a flat bed without using bedrails?: A Little Help needed moving from lying on your back to sitting on the side of a flat bed without using bedrails?: A Little Help needed moving to and from a bed to a chair (including a wheelchair)?: A Little Help needed standing up from a chair using your arms (e.g., wheelchair or bedside chair)?: A Little Help needed to walk in hospital room?: A Little Help needed climbing 3-5 steps with a railing? : A Lot 6 Click Score: 17    End of Session Equipment Utilized During Treatment: Gait belt Activity Tolerance: Patient tolerated treatment well;Patient limited by fatigue Patient left: in bed;with call bell/phone within reach;with family/visitor present Nurse Communication: Mobility status PT Visit Diagnosis: Unsteadiness on feet (R26.81);Muscle weakness (generalized) (M62.81);Pain Pain - Right/Left: Right Pain - part of body: Leg (and lumbar spine)     Time: 8672-8645 PT Time Calculation (min) (ACUTE ONLY): 27 min  Charges:    $Gait Training: 23-37 mins PT General Charges $$ ACUTE PT VISIT: 1 Visit                     Stann, PT Acute Rehabilitation Services Office: [phone] 10/18/2023    Stann [name] 10/18/2023, 2:14 PM

## 2023-10-18 NOTE — Progress Notes (Signed)
 The patient's daughter is requesting for a provider to come speak with her about the patient's continued deconditioning, increased lethargy and sleeping, and lack of appetite. Messaged Bailey Lesser.

## 2023-10-18 NOTE — Progress Notes (Signed)
 MEWS 2. Informed Misty Deleon.    10/18/23 2109  Vitals  Temp (!) 100.9 F (38.3 C)  Temp Source Oral  BP 103/75  MAP (mmHg) 85  BP Location Right Arm  BP Method Automatic  Patient Position (if appropriate) Sitting  Pulse Rate (!) 117  Pulse Rate Source Monitor  Resp 18  MEWS COLOR  MEWS Score Color Yellow  Oxygen Therapy  SpO2 98 %  O2 Device Room Air

## 2023-10-19 ENCOUNTER — Inpatient Hospital Stay (HOSPITAL_COMMUNITY): Payer: Medicare PPO

## 2023-10-19 DIAGNOSIS — R197 Diarrhea, unspecified: Secondary | ICD-10-CM | POA: Diagnosis not present

## 2023-10-19 DIAGNOSIS — R112 Nausea with vomiting, unspecified: Secondary | ICD-10-CM | POA: Diagnosis not present

## 2023-10-19 LAB — CBC WITH DIFFERENTIAL/PLATELET
Abs Immature Granulocytes: 0.01 10*3/uL (ref 0.00–0.07)
Basophils Absolute: 0 10*3/uL (ref 0.0–0.1)
Basophils Relative: 0 %
Eosinophils Absolute: 0.3 10*3/uL (ref 0.0–0.5)
Eosinophils Relative: 7 %
HCT: 27.9 % — ABNORMAL LOW (ref 36.0–46.0)
Hemoglobin: 9.1 g/dL — ABNORMAL LOW (ref 12.0–15.0)
Immature Granulocytes: 0 %
Lymphocytes Relative: 27 %
Lymphs Abs: 1.2 10*3/uL (ref 0.7–4.0)
MCH: 33.1 pg (ref 26.0–34.0)
MCHC: 32.6 g/dL (ref 30.0–36.0)
MCV: 101.5 fL — ABNORMAL HIGH (ref 80.0–100.0)
Monocytes Absolute: 0.7 10*3/uL (ref 0.1–1.0)
Monocytes Relative: 15 %
Neutro Abs: 2.3 10*3/uL (ref 1.7–7.7)
Neutrophils Relative %: 51 %
Platelets: 181 10*3/uL (ref 150–400)
RBC: 2.75 MIL/uL — ABNORMAL LOW (ref 3.87–5.11)
RDW: 14.8 % (ref 11.5–15.5)
WBC: 4.6 10*3/uL (ref 4.0–10.5)
nRBC: 0 % (ref 0.0–0.2)

## 2023-10-19 LAB — COMPREHENSIVE METABOLIC PANEL
ALT: 10 U/L (ref 0–44)
ALT: 8 U/L (ref 0–44)
AST: 17 U/L (ref 15–41)
AST: 18 U/L (ref 15–41)
Albumin: 2.5 g/dL — ABNORMAL LOW (ref 3.5–5.0)
Albumin: 2.5 g/dL — ABNORMAL LOW (ref 3.5–5.0)
Alkaline Phosphatase: 61 U/L (ref 38–126)
Alkaline Phosphatase: 58 U/L (ref 38–126)
Anion gap: 11 (ref 5–15)
Anion gap: 12 (ref 5–15)
BUN: 16 mg/dL (ref 8–23)
BUN: 15 mg/dL (ref 8–23)
CO2: 20 mmol/L — ABNORMAL LOW (ref 22–32)
CO2: 19 mmol/L — ABNORMAL LOW (ref 22–32)
Calcium: 9.2 mg/dL (ref 8.9–10.3)
Calcium: 9.3 mg/dL (ref 8.9–10.3)
Chloride: 104 mmol/L (ref 98–111)
Chloride: 104 mmol/L (ref 98–111)
Creatinine, Ser: 1.23 mg/dL — ABNORMAL HIGH (ref 0.44–1.00)
Creatinine, Ser: 1.11 mg/dL — ABNORMAL HIGH (ref 0.44–1.00)
GFR, Estimated: 47 mL/min — ABNORMAL LOW (ref >60)
GFR, Estimated: 53 mL/min — ABNORMAL LOW (ref >60)
Glucose, Bld: 97 mg/dL (ref 70–99)
Glucose, Bld: 92 mg/dL (ref 70–99)
Potassium: 3.6 mmol/L (ref 3.5–5.1)
Potassium: 3.7 mmol/L (ref 3.5–5.1)
Sodium: 135 mmol/L (ref 135–145)
Sodium: 135 mmol/L (ref 135–145)
Total Bilirubin: 0.9 mg/dL (ref 0.0–1.2)
Total Bilirubin: 1 mg/dL (ref 0.0–1.2)
Total Protein: 5.2 g/dL — ABNORMAL LOW (ref 6.5–8.1)
Total Protein: 5.4 g/dL — ABNORMAL LOW (ref 6.5–8.1)

## 2023-10-19 LAB — RESPIRATORY PANEL BY PCR

## 2023-10-19 LAB — URINALYSIS, ROUTINE W REFLEX MICROSCOPIC
Bilirubin Urine: NEGATIVE
Glucose, UA: NEGATIVE mg/dL
Hgb urine dipstick: NEGATIVE
Ketones, ur: 5 mg/dL — AB
Nitrite: NEGATIVE
Protein, ur: 30 mg/dL — AB
Specific Gravity, Urine: 1.03 (ref 1.005–1.030)
pH: 5 (ref 5.0–8.0)

## 2023-10-19 LAB — SARS CORONAVIRUS 2 BY RT PCR: SARS Coronavirus 2 by RT PCR: NEGATIVE

## 2023-10-19 LAB — CBC
HCT: 28.3 % — ABNORMAL LOW (ref 36.0–46.0)
Hemoglobin: 9.1 g/dL — ABNORMAL LOW (ref 12.0–15.0)
MCH: 32.2 pg (ref 26.0–34.0)
MCHC: 32.2 g/dL (ref 30.0–36.0)
MCV: 100 fL (ref 80.0–100.0)
Platelets: 197 10*3/uL (ref 150–400)
RBC: 2.83 MIL/uL — ABNORMAL LOW (ref 3.87–5.11)
RDW: 14.7 % (ref 11.5–15.5)
WBC: 4.9 10*3/uL (ref 4.0–10.5)
nRBC: 0 % (ref 0.0–0.2)

## 2023-10-19 LAB — PROCALCITONIN: Procalcitonin: 16.38 ng/mL

## 2023-10-19 LAB — LACTIC ACID, PLASMA
Lactic Acid, Venous: 1.4 mmol/L (ref 0.5–1.9)
Lactic Acid, Venous: 1.5 mmol/L (ref 0.5–1.9)

## 2023-10-19 LAB — AMMONIA: Ammonia: 18 umol/L (ref 9–35)

## 2023-10-19 LAB — FOLATE: Folate: 10.8 ng/mL (ref >5.9)

## 2023-10-19 LAB — TSH: TSH: 6.219 u[IU]/mL — ABNORMAL HIGH (ref 0.350–4.500)

## 2023-10-19 LAB — VITAMIN B12: Vitamin B-12: 2719 pg/mL — ABNORMAL HIGH (ref 180–914)

## 2023-10-19 MED ORDER — FREE WATER
100.0000 mL | Status: AC
  Administered 2023-10-19 – 2023-10-20 (×10): 100 mL

## 2023-10-19 MED ORDER — LACTATED RINGERS IV BOLUS
500.0000 mL | Freq: Once | INTRAVENOUS | Status: AC
  Administered 2023-10-19: 500 mL via INTRAVENOUS

## 2023-10-19 MED ORDER — LACTATED RINGERS IV SOLN: INTRAVENOUS | Status: AC

## 2023-10-19 MED ORDER — SODIUM CHLORIDE 0.9 % IV SOLN: INTRAVENOUS | Status: DC | PRN

## 2023-10-19 MED ORDER — ORAL CARE MOUTH RINSE: 15.0000 mL | OROMUCOSAL | Status: DC | PRN

## 2023-10-19 MED ORDER — BENZOCAINE 20 % MT AERO
INHALATION_SPRAY | Freq: Four times a day (QID) | OROMUCOSAL | Status: DC | PRN
  Filled 2023-10-19: qty 57

## 2023-10-19 MED ORDER — METRONIDAZOLE 500 MG/100ML IV SOLN
500.0000 mg | Freq: Two times a day (BID) | INTRAVENOUS | Status: DC
  Administered 2023-10-19 – 2023-10-24 (×11): 500 mg via INTRAVENOUS
  Filled 2023-10-19 (×11): qty 100

## 2023-10-19 MED ORDER — SODIUM CHLORIDE 0.9 % IV SOLN
2.0000 g | Freq: Two times a day (BID) | INTRAVENOUS | Status: DC
  Administered 2023-10-19 – 2023-10-23 (×9): 2 g via INTRAVENOUS
  Filled 2023-10-19 (×9): qty 12.5

## 2023-10-19 MED ORDER — SODIUM CHLORIDE 0.9 % IV SOLN: 2.0000 g | Freq: Once | INTRAVENOUS | Status: DC

## 2023-10-19 NOTE — Progress Notes (Signed)
 Pt alert and oriented *2. Pt noted lethargic but able to verbalized needs known. Family refused to have Gastric emptying study this morning due to nausea and vomiting pt was experiencing. Pt noted to be gagging and pocketing fluid in mouth while assisting to brush her teeth. MD notified about it. NG tube placed with 2 Rns assistance. Air bolus auscultated with Dr Lue at bedside. Awaiting for KUB x-ray for placement verification. Family at bedside. Will continue to monitor.

## 2023-10-19 NOTE — TOC Progression Note (Signed)
 Transition of Care [facility]) - Progression Note    Patient Details  Name: Misty Deleon MRN: [id number] Date of Birth: 25-Jul-1951  [facility]) CM/SW Contact  Toy LITTIE Agar, RN Phone [phone]  10/19/2023, 11:58 AM  Clinical Narrative:    TOC acknowledges consult for  Discussed patient's weakness -not safe to return home per husband so I offered SNF. Not sure if they'll accept but more info would probably help them decide.   CM at bedside introduced self to husband and explained consult. Husband states that there has been a drastic change in the patients condition and would like to defer conversation to later time based on patients condition.       Expected Discharge Plan and Services                                               Social Determinants of Health (SDOH) Interventions SDOH Screenings   Food Insecurity: No Food Insecurity (10/16/2023)  Housing: Low Risk  (10/16/2023)  Transportation Needs: No Transportation Needs (10/16/2023)  Utilities: Not At Risk (10/16/2023)  Social Connections: Socially Integrated (10/16/2023)  Tobacco Use: Low Risk  (10/18/2023)    Readmission Risk Interventions    07/16/2023   10:49 AM 07/09/2023    4:06 PM  Readmission Risk Prevention Plan  Transportation Screening Complete Complete  PCP or Specialist Appt within 5-7 Days  Complete  PCP or Specialist Appt within 3-5 Days Complete   Home Care Screening  Complete  Medication Review (RN CM)  Complete  HRI or Home Care Consult Complete   Social Work Consult for Recovery Care Planning/Counseling Complete   Palliative Care Screening Not Applicable   Medication Review Oceanographer) Complete

## 2023-10-19 NOTE — Progress Notes (Signed)
 PT Cancellation Note  Patient Details Name: Misty Deleon MRN: [id number] DOB: [birth year]   Cancelled Treatment:    Pt unable to tolerate Physical Therapy today per medical issues.  Will attempt to see another day.  Katheryn Leap  PTA Acute  Rehabilitation Services Office M-F          [phone]

## 2023-10-19 NOTE — Progress Notes (Signed)
 Pharmacy Antibiotic Note  Misty Deleon is a 73 y.o. female admitted on 10/15/2023 with persistent nausea, vomiting and diarrhea..  Pharmacy has been consulted to dose cefepime  for fever of unknown source.  Plan: Cefepime  2gm IV q12h Follow renal function and clinical course  Height: 5' 1 (154.9 cm) Weight: 84.9 kg (187 lb 2.7 oz) IBW/kg (Calculated) : 47.8  Temp (24hrs), Avg:100.3 F (37.9 C), Min:97.6 F (36.4 C), Max:102.8 F (39.3 C)  Recent Labs  Lab 10/12/23 1828 10/15/23 1358 10/16/23 0229 10/16/23 0818  WBC 4.3 4.4  --  2.9*  CREATININE 1.11* 1.06* 0.86  --     Estimated Creatinine Clearance: 59.3 mL/min (by C-G formula based on SCr of 0.86 mg/dL).    Allergies  Allergen Reactions   Lisinopril  Anaphylaxis, Swelling and Other (See Comments)    Entire tongue became swollen and the exterior front of the throat.    Amlodipine  Swelling and Other (See Comments)    Legs became swollen    Thank you for allowing pharmacy to be a part of this patient's care.  Leeroy Mace RPh 10/19/2023, 2:38 AM

## 2023-10-19 NOTE — Progress Notes (Signed)
 Accepted care of patient, during BSR, patient's family requesting benzocaine  spray for throat pain. Noted patient coughing every few seconds, patient's husband states it's because of her sore throat due to the NG tube. Family asked about starting water  trial through the tube since the doctor verified that the tube was in the stomach. I stated that we would still need to confirm with xray and the report was still pending. While discussing with patient's family, radiology report became available, noting that the NG tube was in the patient's lung. Suction immediately stopped. I explained the situation to the patient's family and the NG tube was removed. A new tube was placed in the left nare, bridle in place. Patient tolerated well, complained of pain but denied any diffculty breathing, no coughing noted. KUB ordered. Patient resting comfortably, family at bedside.

## 2023-10-19 NOTE — Progress Notes (Signed)
 PROGRESS NOTE    Misty Deleon  FMW:[id number] DOB: 11/21/1950 DOA: 10/15/2023 PCP: Salina Clarity, NP   Brief Narrative:  73 y.o. female with medical history significant for cervical cancer on maintenance bevacizumab , T2DM, CKD stage IIIa, HTN, HLD, hypothyroidism, anemia due to B12 deficiency, recent C. difficile infection who presented to the ED for evaluation of persistent nausea, vomiting, and diarrhea. Patient was previously admitted late October 2024 with acute pyelonephritis treated with cephalosporins. She was readmitted shortly after at the beginning of November for C. difficile diarrhea. She was treated with oral vancomycin . CT abdomen/pelvis with contrast showed mild fat stranding surrounding the bladder, mild rectal wall thickening worrisome for proctitis. Nonobstructing right renal calculus, sigmoid colon diverticulosis, stable presacral and perirectal edema.   Overnight patient had essentially rapid response with worsening mental status fever concerning for infectious process.  Assessment & Plan:   Principal Problem:   Nausea, vomiting, and diarrhea Active Problems:   Cervical cancer (HCC)   Chronic kidney disease, stage 3a (HCC)   Type 2 diabetes mellitus (HCC)   Vitamin B12 deficiency   Essential hypertension   Hypokalemia   Hypothyroidism   Failure to thrive in adult  SIRS criteria without clear source, not POA  -No clearly identified source given negative imaging thus far -Cultures pending -Having Flagyl  added overnight, will continue until studies are back  Acute metabolic encephalopathy, multifactorial -In the setting of recent narcotics, high fever, malnutrition and possible hospital delirium given poor sleep cycle -Improving somewhat over the past 24 hours with cessation of narcotics and supportive care including fluids and antibiotics -Follow clinically, no further indication for imaging at this time given  improvement  Nausea/vomiting/diarrhea/dysgeusia Rule out delayed gastric emptying/gastroparesis Rule out constipation - Insidious onset over the last couple months, continue to advance diet as tolerated - CT abdomen pelvis shows mild rectal thickening without overt obstruction -in the setting of previous cancer and radiation - No real improvement despite metoclopramide , antiemetics -Gastric emptying study on hold given NG tube placement as below -NG placed t 10/19/2023 given profound inability to tolerate even liquids at this point -Start free water  100 cc/h over the next 10 to 12 hours, if tolerating well we will transition patient to trickle tube feeds and follow clinically - Decrease narcotic burden given concern for gastric motility issues - Stool studies ordered but not obtained due to lack of bowel movement, ultimately canceled -Continue Senokot/MiraLAX  in the setting of constipation complicated by narcotic use -improving over the last 24 hours -Hold further meal supplements until NG feeds can be tolerated  Hypokalemia: - Stable   Low back pain, chronic: - Follows with [facility], MRI performed while in hospital showing endplate compression at L4, moderate to severe spinal canal narrowing at L3-4 and foraminal narrowing at L3-4 and L4-5 - Plan for outpatient follow-up after discharge -no focal paresthesias or weakness that would indicate urgent evaluation - IR asked to evaluate imaging for appropriateness of possible kyphoplasty -IR attempting to get patient's procedure preapproved given insurance requires prior authorization.  Procedure on hold now given above clinical status and questionable infectious process  CKD stage IIIa: Renal function stable.  Continue to monitor. Cervical cancer: Follows with Dr. Lonn fusi bevacizumab .  In remission. Type 2 diabetes:, Well-controlled -hold p.o. medications in the interim -restart at discharge  Hypertension: Blood pressure currently  well-controlled off home medications. Hyperlipidemia: Continue rosuvastatin . Hypothyroidism: Continue Synthroid . Anemia/B12 deficiency: Continue supplementation  DVT prophylaxis: enoxaparin  (LOVENOX ) injection 40 mg Start: 10/15/23 2200 Code Status:  Code Status: Full Code Family Communication: Husband at bedside  Status is: Inpatient  Dispo: The patient is from: Home              Anticipated d/c is to: To be determined -likely SNF              Anticipated d/c date is: 72+ hours              Patient currently not medically stable for discharge  Consultants:  Oncology (IR/Ortho sidelined in regards to back pain/spinal narrowing)  Procedures:  None  Antimicrobials:  None  Subjective: Overnight episode of fever with worsening mentation and tachycardia.  Improved with supportive care, fluids and antibiotics.  Objective: Vitals:   10/19/23 0125 10/19/23 0152 10/19/23 0449 10/19/23 0608  BP:  113/83 122/82 (!) 129/58  Pulse:  (!) 116 (!) 129 (!) 122  Resp:  18 18 18   Temp: (!) 102.8 F (39.3 C) (!) 101.3 F (38.5 C) (!) 101 F (38.3 C) (!) 100.8 F (38.2 C)  TempSrc: Rectal Oral Oral Oral  SpO2:  97% 96% 99%  Weight:      Height:        Intake/Output Summary (Last 24 hours) at 10/19/2023 0747 Last data filed at 10/19/2023 0400 Gross per 24 hour  Intake 340 ml  Output 60 ml  Net 280 ml   Filed Weights   10/15/23 1151 10/17/23 0500  Weight: 83.5 kg 84.9 kg    Examination:  General:  Pleasantly resting in bed, No acute distress. HEENT: NG tube right nare Neck:  Without mass or deformity.  Trachea is midline. Lungs:  Clear to auscultate bilaterally without rhonchi, wheeze, or rales. Heart:  Regular rate and rhythm.  Without murmurs, rubs, or gallops. Abdomen:  Soft, nontender, nondistended.  Without guarding or rebound. Extremities: Without cyanosis, clubbing, edema, or obvious deformity. Skin:  Warm and dry, no erythema.   Data Reviewed: I have personally  reviewed following labs and imaging studies  CBC: Recent Labs  Lab 10/12/23 1828 10/15/23 1358 10/16/23 0818 10/19/23 0237 10/19/23 0344  WBC 4.3 4.4 2.9* 4.6 4.9  NEUTROABS  --  2.3  --  2.3  --   HGB 10.2* 10.6* 9.2* 9.1* 9.1*  HCT 30.6* 32.3* 28.9* 27.9* 28.3*  MCV 98.1 100.3* 101.0* 101.5* 100.0  PLT 181 179 145* 181 197   Basic Metabolic Panel: Recent Labs  Lab 10/12/23 1828 10/15/23 1358 10/16/23 0229 10/19/23 0237 10/19/23 0344  NA 137 134* 137 135 135  K 3.7 3.3* 3.6 3.6 3.7  CL 103 102 102 104 104  CO2 25 20* 26 20* 19*  GLUCOSE 113* 80 95 97 92  BUN 11 12 12 16 15   CREATININE 1.11* 1.06* 0.86 1.23* 1.11*  CALCIUM  9.9 9.6 8.8* 9.2 9.3  MG  --  1.4* 2.0  --   --    GFR: Estimated Creatinine Clearance: 45.9 mL/min (A) (by C-G formula based on SCr of 1.11 mg/dL (H)). Liver Function Tests: Recent Labs  Lab 10/12/23 1828 10/15/23 1358 10/16/23 0229 10/19/23 0237 10/19/23 0344  AST 22 23 20 17 18   ALT 8 10 18 10 8   ALKPHOS 70 57 92 61 58  BILITOT 0.8 1.1 0.5 0.9 1.0  PROT 6.1* 6.0* 6.0* 5.2* 5.4*  ALBUMIN  3.2* 3.3* 2.7* 2.5* 2.5*   Recent Labs  Lab 10/12/23 1828 10/15/23 1358  LIPASE 34 34    Recent Results (from the past 240 hours)  Resp panel by RT-PCR (  RSV, Flu A&B, Covid) Anterior Nasal Swab     Status: None   Collection Time: 10/12/23  8:07 PM   Specimen: Anterior Nasal Swab  Result Value Ref Range Status   SARS Coronavirus 2 by RT PCR NEGATIVE NEGATIVE Final    Comment: (NOTE) SARS-CoV-2 target nucleic acids are NOT DETECTED.  The SARS-CoV-2 RNA is generally detectable in upper respiratory specimens during the acute phase of infection. The lowest concentration of SARS-CoV-2 viral copies this assay can detect is 138 copies/mL. A negative result does not preclude SARS-Cov-2 infection and should not be used as the sole basis for treatment or other patient management decisions. A negative result may occur with  improper specimen  collection/handling, submission of specimen other than nasopharyngeal swab, presence of viral mutation(s) within the areas targeted by this assay, and inadequate number of viral copies(<138 copies/mL). A negative result must be combined with clinical observations, patient history, and epidemiological information. The expected result is Negative.  Fact Sheet for Patients:  bloggercourse.com  Fact Sheet for Healthcare Providers:  seriousbroker.it  This test is no t yet approved or cleared by the [place]  FDA and  has been authorized for detection and/or diagnosis of SARS-CoV-2 by FDA under an Emergency Use Authorization (EUA). This EUA will remain  in effect (meaning this test can be used) for the duration of the COVID-19 declaration under Section 564(b)(1) of the Act, 21 U.S.C.section 360bbb-3(b)(1), unless the authorization is terminated  or revoked sooner.       Influenza A by PCR NEGATIVE NEGATIVE Final   Influenza B by PCR NEGATIVE NEGATIVE Final    Comment: (NOTE) The Xpert Xpress SARS-CoV-2/FLU/RSV plus assay is intended as an aid in the diagnosis of influenza from Nasopharyngeal swab specimens and should not be used as a sole basis for treatment. Nasal washings and aspirates are unacceptable for Xpert Xpress SARS-CoV-2/FLU/RSV testing.  Fact Sheet for Patients: bloggercourse.com  Fact Sheet for Healthcare Providers: seriousbroker.it  This test is not yet approved or cleared by the [place]  FDA and has been authorized for detection and/or diagnosis of SARS-CoV-2 by FDA under an Emergency Use Authorization (EUA). This EUA will remain in effect (meaning this test can be used) for the duration of the COVID-19 declaration under Section 564(b)(1) of the Act, 21 U.S.C. section 360bbb-3(b)(1), unless the authorization is terminated or revoked.     Resp Syncytial  Virus by PCR NEGATIVE NEGATIVE Final    Comment: (NOTE) Fact Sheet for Patients: bloggercourse.com  Fact Sheet for Healthcare Providers: seriousbroker.it  This test is not yet approved or cleared by the [place]  FDA and has been authorized for detection and/or diagnosis of SARS-CoV-2 by FDA under an Emergency Use Authorization (EUA). This EUA will remain in effect (meaning this test can be used) for the duration of the COVID-19 declaration under Section 564(b)(1) of the Act, 21 U.S.C. section 360bbb-3(b)(1), unless the authorization is terminated or revoked.  Performed at [facility], [address] [place], [place] [address]   Resp panel by RT-PCR (RSV, Flu A&B, Covid) Anterior Nasal Swab     Status: None   Collection Time: 10/15/23 10:18 PM   Specimen: Anterior Nasal Swab  Result Value Ref Range Status   SARS Coronavirus 2 by RT PCR NEGATIVE NEGATIVE Final    Comment: (NOTE) SARS-CoV-2 target nucleic acids are NOT DETECTED.  The SARS-CoV-2 RNA is generally detectable in upper respiratory specimens during the acute phase of infection. The lowest concentration of SARS-CoV-2 viral copies this  assay can detect is 138 copies/mL. A negative result does not preclude SARS-Cov-2 infection and should not be used as the sole basis for treatment or other patient management decisions. A negative result may occur with  improper specimen collection/handling, submission of specimen other than nasopharyngeal swab, presence of viral mutation(s) within the areas targeted by this assay, and inadequate number of viral copies(<138 copies/mL). A negative result must be combined with clinical observations, patient history, and epidemiological information. The expected result is Negative.  Fact Sheet for Patients:  bloggercourse.com  Fact Sheet for Healthcare Providers:   seriousbroker.it  This test is no t yet approved or cleared by the [place]  FDA and  has been authorized for detection and/or diagnosis of SARS-CoV-2 by FDA under an Emergency Use Authorization (EUA). This EUA will remain  in effect (meaning this test can be used) for the duration of the COVID-19 declaration under Section 564(b)(1) of the Act, 21 U.S.C.section 360bbb-3(b)(1), unless the authorization is terminated  or revoked sooner.       Influenza A by PCR NEGATIVE NEGATIVE Final   Influenza B by PCR NEGATIVE NEGATIVE Final    Comment: (NOTE) The Xpert Xpress SARS-CoV-2/FLU/RSV plus assay is intended as an aid in the diagnosis of influenza from Nasopharyngeal swab specimens and should not be used as a sole basis for treatment. Nasal washings and aspirates are unacceptable for Xpert Xpress SARS-CoV-2/FLU/RSV testing.  Fact Sheet for Patients: bloggercourse.com  Fact Sheet for Healthcare Providers: seriousbroker.it  This test is not yet approved or cleared by the [place]  FDA and has been authorized for detection and/or diagnosis of SARS-CoV-2 by FDA under an Emergency Use Authorization (EUA). This EUA will remain in effect (meaning this test can be used) for the duration of the COVID-19 declaration under Section 564(b)(1) of the Act, 21 U.S.C. section 360bbb-3(b)(1), unless the authorization is terminated or revoked.     Resp Syncytial Virus by PCR NEGATIVE NEGATIVE Final    Comment: (NOTE) Fact Sheet for Patients: bloggercourse.com  Fact Sheet for Healthcare Providers: seriousbroker.it  This test is not yet approved or cleared by the [place]  FDA and has been authorized for detection and/or diagnosis of SARS-CoV-2 by FDA under an Emergency Use Authorization (EUA). This EUA will remain in effect (meaning this test can be used) for  the duration of the COVID-19 declaration under Section 564(b)(1) of the Act, 21 U.S.C. section 360bbb-3(b)(1), unless the authorization is terminated or revoked.  Performed at [facility], [address], [place], [place] [address]   SARS Coronavirus 2 by RT PCR (hospital order, performed in [facility] hospital lab) *cepheid single result test* Urine, Clean Catch     Status: None   Collection Time: 10/19/23  3:05 AM   Specimen: Urine, Clean Catch; Nasal Swab  Result Value Ref Range Status   SARS Coronavirus 2 by RT PCR NEGATIVE NEGATIVE Final    Comment: (NOTE) SARS-CoV-2 target nucleic acids are NOT DETECTED.  The SARS-CoV-2 RNA is generally detectable in upper and lower respiratory specimens during the acute phase of infection. The lowest concentration of SARS-CoV-2 viral copies this assay can detect is 250 copies / mL. A negative result does not preclude SARS-CoV-2 infection and should not be used as the sole basis for treatment or other patient management decisions.  A negative result may occur with improper specimen collection / handling, submission of specimen other than nasopharyngeal swab, presence of viral mutation(s) within the areas targeted by this assay, and inadequate number  of viral copies (<250 copies / mL). A negative result must be combined with clinical observations, patient history, and epidemiological information.  Fact Sheet for Patients:   roadlaptop.co.za  Fact Sheet for Healthcare Providers: http://kim-miller.com/  This test is not yet approved or  cleared by the [place]  FDA and has been authorized for detection and/or diagnosis of SARS-CoV-2 by FDA under an Emergency Use Authorization (EUA).  This EUA will remain in effect (meaning this test can be used) for the duration of the COVID-19 declaration under Section 564(b)(1) of the Act, 21 U.S.C. section 360bbb-3(b)(1), unless the  authorization is terminated or revoked sooner.  Performed at [facility], [address] [place], [place] [address]   Respiratory (~20 pathogens) panel by PCR     Status: None   Collection Time: 10/19/23  3:05 AM   Specimen: Nasopharyngeal Swab; Respiratory  Result Value Ref Range Status   Adenovirus NOT DETECTED NOT DETECTED Final   Coronavirus 229E NOT DETECTED NOT DETECTED Final    Comment: (NOTE) The Coronavirus on the Respiratory Panel, DOES NOT test for the novel  Coronavirus (2019 nCoV)    Coronavirus HKU1 NOT DETECTED NOT DETECTED Final   Coronavirus NL63 NOT DETECTED NOT DETECTED Final   Coronavirus OC43 NOT DETECTED NOT DETECTED Final   Metapneumovirus NOT DETECTED NOT DETECTED Final   Rhinovirus / Enterovirus NOT DETECTED NOT DETECTED Final   Influenza A NOT DETECTED NOT DETECTED Final   Influenza B NOT DETECTED NOT DETECTED Final   Parainfluenza Virus 1 NOT DETECTED NOT DETECTED Final   Parainfluenza Virus 2 NOT DETECTED NOT DETECTED Final   Parainfluenza Virus 3 NOT DETECTED NOT DETECTED Final   Parainfluenza Virus 4 NOT DETECTED NOT DETECTED Final   Respiratory Syncytial Virus NOT DETECTED NOT DETECTED Final   Bordetella pertussis NOT DETECTED NOT DETECTED Final   Bordetella Parapertussis NOT DETECTED NOT DETECTED Final   Chlamydophila pneumoniae NOT DETECTED NOT DETECTED Final   Mycoplasma pneumoniae NOT DETECTED NOT DETECTED Final    Comment: Performed at [facility], [address], [place], [place] [address]      Radiology Studies: DG Chest Port 1 View Result Date: 10/19/2023 CLINICAL DATA:  Altered mental status. EXAM: PORTABLE CHEST 1 VIEW COMPARISON:  July 06, 2023 FINDINGS: There is stable right-sided venous Port-A-Cath positioning. The heart size and mediastinal contours are within normal limits. Both lungs are clear. Multilevel degenerative changes seen throughout the thoracic spine. IMPRESSION: No active disease.  Electronically Signed   By: Suzen Dials M.D.   On: 10/19/2023 01:36   MR LUMBAR SPINE W CONTRAST Result Date: 10/18/2023 CLINICAL DATA:  Initial evaluation for persistent edema about the L4 vertebral body fracture. Evaluate for underlying lesion. EXAM: MRI LUMBAR SPINE WITH CONTRAST TECHNIQUE: Multiplanar and multiecho pulse sequences of the lumbar spine were obtained with intravenous contrast. CONTRAST:  8mL GADAVIST  GADOBUTROL  1 MMOL/ML IV SOLN COMPARISON:  Prior noncontrast MRI from 10/16/2023 FINDINGS: Segmentation: Transitional lumbosacral anatomy with sacralization of the L5 vertebral body. Same numbering system is employed as on previous exam. Alignment: Physiologic with preservation of the normal lumbar lordosis. Vertebrae: Previously identified fracture involving the inferior endplate of L4 again seen, stable from prior MRI. Fractured demonstrates postcontrast enhancement, consistent with an evolving acute to subacute fracture. No convincing or visible underlying mass lesion or other pathologic features by MRI, although evaluation is somewhat limited by the diffuse edema at this level. No other worrisome osseous lesions or abnormal enhancement elsewhere within the  lumbar spine. Vertebral body height otherwise maintained. Conus medullaris and cauda equina: Conus extends to the L1 level. Conus and cauda equina appear normal. Paraspinal and other soft tissues: Mild paraspinous edema and enhancement about the L4 vertebral body. No other acute finding. Disc levels: Underlying lumbar spondylosis with multilevel facet arthrosis, described on recent MRI. No new findings. IMPRESSION: 1. Postcontrast enhancement involving the previously identified fracture involving the inferior endplate of L4, consistent with an evolving acute to subacute fracture. No convincing underlying mass lesion or other pathologic features seen on today's exam, although evaluation is somewhat limited by the diffuse enhancement. If  there remains concern for a possible underlying occult pathologic lesion, a short interval follow-up MRI could be performed for further evaluation as warranted. 2. No other new abnormality within the lumbar spine. Electronically Signed   By: Morene Hoard M.D.   On: 10/18/2023 23:27   Scheduled Meds:  Chlorhexidine  Gluconate Cloth  6 each Topical Daily   enoxaparin  (LOVENOX ) injection  40 mg Subcutaneous Q24H   feeding supplement  237 mL Oral TID AC & HS   levothyroxine   50 mcg Oral QAC breakfast   metoCLOPramide  (REGLAN ) injection  10 mg Intravenous Q8H   polyethylene glycol  17 g Oral BID   rosuvastatin   40 mg Oral QHS   saccharomyces boulardii  250 mg Oral BID   senna-docusate  2 tablet Oral BID   sodium chloride  flush  10-40 mL Intracatheter Q12H   sodium chloride  flush  3 mL Intravenous Q12H   thiamine   100 mg Oral Daily   Continuous Infusions:  ceFEPime  (MAXIPIME ) IV 2 g (10/19/23 0448)   lactated ringers      metronidazole  500 mg (10/19/23 0334)     LOS: 1 day   Time spent:  Elsie JAYSON Montclair, DO [facility]  If 7PM-7AM, please contact night-coverage [email or web address]  10/19/2023, 7:47 AM

## 2023-10-19 NOTE — Progress Notes (Signed)
 NGT placed into right nare with two RNS present for insertion. Air bolus auscultated by RN and Dr. Rudine Cos at bedside. Family remain at bedside per their preference. Emotional support provided to patient and family. Ara Knee, RN 10/19/23

## 2023-10-19 NOTE — Progress Notes (Addendum)
 Interventional Radiology Brief Note:  Patient MR reviewed by Dr. everitt Nile Erichsen.  Anatomy is amenable to vertebroplasty/kyphoplasty at L4.  Note patient with change in status today including fevers and confusion.  Hold on pursuing VP/KP at present and consider re-evaluating for insurance approval either as outpatient or once acute infection phase cleared.   Beverlyann Broxterman, MS RD PA-C

## 2023-10-19 NOTE — Progress Notes (Signed)
    Patient Name: Misty Deleon           DOB: [birth year]  MRN: [id number]      Admission Date: 10/15/2023  Attending Provider: Lue Elsie BROCKS, MD  Primary Diagnosis: Nausea, vomiting, and diarrhea   Level of care: Med-Surg    CROSS COVER NOTE   Date of Service   10/19/2023   Misty Deleon, 73 y.o. female, was admitted on 10/15/2023 for Nausea, vomiting, and diarrhea.    HPI/Events of Note   Daughter at bedside concerned with patient being lethargic and confused. Family also reporting poor oral intake.  Started on gentle IV fluids.  RN reported low-grade fever earlier tonight.  On recheck, patient found to be febrile and tachycardic.   Bedside Assessment:  Patient is lethargic.  Requires verbal and physical stimulation to keep patient awake.  Attempts to follow commands, but is weak and falls asleep easily.  Does not appear to be in acute distress at this time.    Patient febrile, 101.2 F and tachycardic, HR 120s.  Normotensive.  No hypoxia or new onset cough.   Neuro: Oriented to self only, PERRLA, facial symmetry present. Significantly weak in all extremities.  Extremities respond to painful stimulation.  Respiratory: Bilaterally clear, no wheezing, no crackles. Normal effort. No accessory muscle use.  Cardiovascular: Regular rhythm.  Tachycardic.  No extremity edema. 2+ pedal pulses.   Abdomen: Abdomen is soft and nontender.  Positive bowel sounds in all quadrants.  No guarding or rebound tenderness.   Plan: Will start workup for sepsis Chest x-ray, UA, blood cultures Labs-lactic acid, CBC, CMP, VBG, procalcitonin Additionally adding ammonia, folate, vitamin B-12, B1, TSH for mental status. Bladder scan- 29 cc.  Adding 500 cc bolus. Tylenol  for fever  Starting on cefepime  and Flagyl  antibiotics   Addendum: 0130- chest x-ray-no active disease  0400-  Lactic acid negative WBC 4.9 Negative COVID, respiratory panel pending.    Interventions/ Plan            Willamae Demby, DNP, ACNPC- AG [facility]

## 2023-10-20 ENCOUNTER — Inpatient Hospital Stay (HOSPITAL_COMMUNITY): Payer: Medicare PPO

## 2023-10-20 DIAGNOSIS — R112 Nausea with vomiting, unspecified: Secondary | ICD-10-CM | POA: Diagnosis not present

## 2023-10-20 DIAGNOSIS — R197 Diarrhea, unspecified: Secondary | ICD-10-CM | POA: Diagnosis not present

## 2023-10-20 LAB — GASTROINTESTINAL PANEL BY PCR, STOOL (REPLACES STOOL CULTURE)

## 2023-10-20 LAB — COMPREHENSIVE METABOLIC PANEL
ALT: 9 U/L (ref 0–44)
AST: 21 U/L (ref 15–41)
Albumin: 2.3 g/dL — ABNORMAL LOW (ref 3.5–5.0)
Alkaline Phosphatase: 60 U/L (ref 38–126)
Anion gap: 12 (ref 5–15)
BUN: 15 mg/dL (ref 8–23)
CO2: 18 mmol/L — ABNORMAL LOW (ref 22–32)
Calcium: 8.9 mg/dL (ref 8.9–10.3)
Chloride: 105 mmol/L (ref 98–111)
Creatinine, Ser: 1.47 mg/dL — ABNORMAL HIGH (ref 0.44–1.00)
GFR, Estimated: 38 mL/min — ABNORMAL LOW (ref >60)
Glucose, Bld: 88 mg/dL (ref 70–99)
Potassium: 3.3 mmol/L — ABNORMAL LOW (ref 3.5–5.1)
Sodium: 135 mmol/L (ref 135–145)
Total Bilirubin: 1.1 mg/dL (ref 0.0–1.2)
Total Protein: 5 g/dL — ABNORMAL LOW (ref 6.5–8.1)

## 2023-10-20 LAB — MAGNESIUM: Magnesium: 1.1 mg/dL — ABNORMAL LOW (ref 1.7–2.4)

## 2023-10-20 LAB — CBC
HCT: 27.8 % — ABNORMAL LOW (ref 36.0–46.0)
Hemoglobin: 8.9 g/dL — ABNORMAL LOW (ref 12.0–15.0)
MCH: 32.4 pg (ref 26.0–34.0)
MCHC: 32 g/dL (ref 30.0–36.0)
MCV: 101.1 fL — ABNORMAL HIGH (ref 80.0–100.0)
Platelets: 182 10*3/uL (ref 150–400)
RBC: 2.75 MIL/uL — ABNORMAL LOW (ref 3.87–5.11)
RDW: 15.3 % (ref 11.5–15.5)
WBC: 6.6 10*3/uL (ref 4.0–10.5)
nRBC: 0 % (ref 0.0–0.2)

## 2023-10-20 LAB — PHOSPHORUS: Phosphorus: 3.3 mg/dL (ref 2.5–4.6)

## 2023-10-20 MED ORDER — ENSURE ENLIVE PO LIQD
237.0000 mL | Freq: Once | ORAL | Status: AC
  Administered 2023-10-20: 237 mL via NASOGASTRIC

## 2023-10-20 MED ORDER — POTASSIUM CHLORIDE 20 MEQ PO PACK
40.0000 meq | PACK | Freq: Once | ORAL | Status: AC
  Administered 2023-10-20: 40 meq
  Filled 2023-10-20: qty 2

## 2023-10-20 MED ORDER — ONDANSETRON HCL 4 MG PO TABS: 4.0000 mg | ORAL_TABLET | Freq: Four times a day (QID) | ORAL | Status: DC | PRN

## 2023-10-20 MED ORDER — ONDANSETRON HCL 4 MG/2ML IJ SOLN: 4.0000 mg | Freq: Four times a day (QID) | INTRAMUSCULAR | Status: DC | PRN

## 2023-10-20 MED ORDER — ENSURE ENLIVE PO LIQD
100.0000 mL | Freq: Once | ORAL | Status: AC
  Administered 2023-10-20: 100 mL via NASOGASTRIC

## 2023-10-20 MED ORDER — SENNOSIDES-DOCUSATE SODIUM 8.6-50 MG PO TABS
2.0000 | ORAL_TABLET | Freq: Two times a day (BID) | ORAL | Status: DC
  Administered 2023-10-20: 2
  Filled 2023-10-20 (×3): qty 2

## 2023-10-20 MED ORDER — ACETAMINOPHEN 10 MG/ML IV SOLN
1000.0000 mg | Freq: Once | INTRAVENOUS | Status: AC
  Administered 2023-10-20: 1000 mg via INTRAVENOUS
  Filled 2023-10-20: qty 100

## 2023-10-20 MED ORDER — IOHEXOL 300 MG/ML  SOLN: 30.0000 mL | Freq: Once | INTRAMUSCULAR | Status: DC | PRN

## 2023-10-20 MED ORDER — LEVOTHYROXINE SODIUM 25 MCG PO TABS
50.0000 ug | ORAL_TABLET | Freq: Every day | ORAL | Status: DC
  Administered 2023-10-20 – 2023-10-23 (×4): 50 ug
  Filled 2023-10-20: qty 1
  Filled 2023-10-20: qty 2
  Filled 2023-10-20 (×2): qty 1

## 2023-10-20 MED ORDER — SACCHAROMYCES BOULARDII 250 MG PO CAPS
250.0000 mg | ORAL_CAPSULE | Freq: Two times a day (BID) | ORAL | Status: DC
  Administered 2023-10-20 – 2023-10-23 (×7): 250 mg
  Filled 2023-10-20 (×7): qty 1

## 2023-10-20 MED ORDER — BOOST / RESOURCE BREEZE PO LIQD CUSTOM
1.0000 | Freq: Three times a day (TID) | ORAL | Status: DC
Start: 2023-10-20 — End: 2023-10-22
  Administered 2023-10-20: 237 mL via ORAL
  Administered 2023-10-20: 1 via ORAL

## 2023-10-20 MED ORDER — LACTATED RINGERS IV SOLN: INTRAVENOUS | Status: AC

## 2023-10-20 MED ORDER — MAGNESIUM SULFATE 2 GM/50ML IV SOLN
2.0000 g | Freq: Once | INTRAVENOUS | Status: AC
  Administered 2023-10-20: 2 g via INTRAVENOUS
  Filled 2023-10-20: qty 50

## 2023-10-20 MED ORDER — ENSURE ENLIVE PO LIQD: 100.0000 mL | Freq: Once | ORAL | Status: DC

## 2023-10-20 MED ORDER — ROSUVASTATIN CALCIUM 20 MG PO TABS
40.0000 mg | ORAL_TABLET | Freq: Every day | ORAL | Status: DC
  Administered 2023-10-20 – 2023-10-22 (×3): 40 mg
  Filled 2023-10-20 (×3): qty 2

## 2023-10-20 MED ORDER — THIAMINE MONONITRATE 100 MG PO TABS
100.0000 mg | ORAL_TABLET | Freq: Every day | ORAL | Status: DC
  Administered 2023-10-20 – 2023-10-23 (×4): 100 mg
  Filled 2023-10-20 (×4): qty 1

## 2023-10-20 MED ORDER — POLYETHYLENE GLYCOL 3350 17 G PO PACK
17.0000 g | PACK | Freq: Two times a day (BID) | ORAL | Status: DC
  Administered 2023-10-20: 17 g
  Filled 2023-10-20 (×3): qty 1

## 2023-10-20 MED ORDER — ACETAMINOPHEN 325 MG PO TABS
650.0000 mg | ORAL_TABLET | Freq: Four times a day (QID) | ORAL | Status: DC | PRN
  Administered 2023-10-21 – 2023-10-22 (×3): 650 mg
  Filled 2023-10-20 (×3): qty 2

## 2023-10-20 MED ORDER — ACETAMINOPHEN 650 MG RE SUPP: 650.0000 mg | Freq: Four times a day (QID) | RECTAL | Status: DC | PRN

## 2023-10-20 MED ORDER — HYDROCODONE-ACETAMINOPHEN 5-325 MG PO TABS: 1.0000 | ORAL_TABLET | ORAL | Status: DC | PRN

## 2023-10-20 MED ORDER — IOHEXOL 300 MG/ML  SOLN
100.0000 mL | Freq: Once | INTRAMUSCULAR | Status: AC | PRN
  Administered 2023-10-20: 100 mL via INTRAVENOUS

## 2023-10-20 NOTE — Progress Notes (Signed)
 PROGRESS NOTE    Misty Deleon  FMW:[id number] DOB: March 13, 1951 DOA: 10/15/2023 PCP: Salina Clarity, NP   Brief Narrative:  73 y.o. female with medical history significant for cervical cancer on maintenance bevacizumab , T2DM, CKD stage IIIa, HTN, HLD, hypothyroidism, anemia due to B12 deficiency, recent C. difficile infection who presented to the ED for evaluation of persistent nausea, vomiting, and diarrhea. Patient was previously admitted late October 2024 with acute pyelonephritis treated with cephalosporins. She was readmitted shortly after at the beginning of November for C. difficile diarrhea. She was treated with oral vancomycin . CT abdomen/pelvis with contrast showed mild fat stranding surrounding the bladder, mild rectal wall thickening worrisome for proctitis. Nonobstructing right renal calculus, sigmoid colon diverticulosis, stable presacral and perirectal edema.   Overnight patient's diet was advanced despite previous discussion with family.  Patient did not tolerate this well had profound episode of nausea requiring NG to be placed to suction.   Assessment & Plan: Principal Problem:   Nausea, vomiting, and diarrhea Active Problems:   Cervical cancer (HCC)   Chronic kidney disease, stage 3a (HCC)   Type 2 diabetes mellitus (HCC)   Vitamin B12 deficiency   Essential hypertension   Hypokalemia   Hypothyroidism   Failure to thrive in adult  SIRS criteria without clear source, not POA  -No clearly identified source given negative imaging thus far -Cultures preliminary negative -Cefepime /Flagyl  ongoing -CT abd/pelvis with contrast pending -Trickle feeds tonight if CT is unremarkable.  Acute metabolic encephalopathy, multifactorial -In the setting of recent narcotics, high fever, malnutrition and possible hospital delirium given poor sleep cycle -Improving somewhat over the past 24 hours with cessation of narcotics and supportive care including fluids and  antibiotics  Nausea/vomiting/diarrhea/dysgeusia Rule out delayed gastric emptying/gastroparesis Rule out constipation - Insidious onset over the last couple months, continue to advance diet as tolerated -Timing per husband coincides with bevacizumab  patient also noted to have elevated B12 which can cause worsening nausea vomiting diarrhea - CT abdomen pelvis shows mild rectal thickening without overt obstruction -in the setting of previous cancer and radiation - No real improvement despite metoclopramide , antiemetics -Gastric emptying study on hold given NG tube placement as below -NG placed t 10/19/2023 for definitive p.o. access -Continue free water  via NG, transition to trickle tube feeds once CT results if no acute process noted -Narcotics discontinued as below per family wishes  - Stool studies ordered again (not previously collected due to constipation) -Continue Senokot/MiraLAX  in the setting of constipation complicated by narcotic use -improving over the last 24 hours -Hold further meal supplements until NG feeds can be tolerated  AKI on CKD stage IIIa Hypokalemia -Continue IV fluids with LR at 100 cc/h x 1L -low threshold to discontinue if patient appears volume overloaded -Continue to replete electrolytes as appropriate   Low back pain, chronic: - Follows with [facility], MRI performed while in hospital showing endplate compression at L4, moderate to severe spinal canal narrowing at L3-4 and foraminal narrowing at L3-4 and L4-5 -IR attempting to get patient's procedure preapproved given insurance requires prior authorization.  Procedure on hold now given above clinical status and questionable infectious process -Procedures currently on hold given worsening clinical status as above  Cervical cancer: Follows with Dr. Lonn fusi bevacizumab .  In remission. Type 2 diabetes:, Well-controlled -hold p.o. medications in the interim -restart at discharge  Hypertension: Blood pressure  currently well-controlled off home medications. Hyperlipidemia: Continue rosuvastatin . Hypothyroidism: Continue Synthroid . Anemia/B12 deficiency: Continue supplementation  DVT prophylaxis: enoxaparin  (LOVENOX ) injection 40 mg Start:  10/15/23 2200 Code Status:   Code Status: Full Code Family Communication: Husband at bedside  Status is: Inpatient  Dispo: The patient is from: Home              Anticipated d/c is to: To be determined -likely SNF              Anticipated d/c date is: 72+ hours              Patient currently not medically stable for discharge  Consultants:  Oncology (IR/Ortho sidelined in regards to back pain/spinal narrowing)  Procedures:  None  Antimicrobials:  None  Subjective: Overnight episode of increased nausea vomiting as patient's diet was advanced somewhat abruptly.  Noted febrile event again overnight as well during this timeframe.  Review of systems limited by patient's mental status  Objective: Vitals:   10/20/23 0100 10/20/23 0224 10/20/23 0427 10/20/23 0611  BP: 103/69  (!) 105/59 (!) 139/114  Pulse: (!) 126  (!) 129 (!) 125  Resp:   16 18  Temp: 100 F (37.8 C) (!) 101.9 F (38.8 C) (!) 100.7 F (38.2 C) 98.3 F (36.8 C)  TempSrc: Axillary Rectal Oral Oral  SpO2: 95%  97% 98%  Weight:      Height:        Intake/Output Summary (Last 24 hours) at 10/20/2023 0800 Last data filed at 10/20/2023 0300 Gross per 24 hour  Intake 1265.83 ml  Output 220 ml  Net 1045.83 ml   Filed Weights   10/15/23 1151 10/17/23 0500  Weight: 83.5 kg 84.9 kg    Examination:  General: Somnolent but easily arousable, resting comfortably in bed in no acute distress. HEENT: NG tube right nare Neck:  Without mass or deformity.  Trachea is midline. Lungs:  Clear to auscultate bilaterally without rhonchi, wheeze, or rales. Heart:  Regular rate and rhythm.  Without murmurs, rubs, or gallops. Abdomen:  Soft, nontender, nondistended. Extremities: Without cyanosis,  clubbing, or obvious deformity.  Scant edema left upper extremity Skin:  Warm and dry, no erythema.  Data Reviewed: I have personally reviewed following labs and imaging studies  CBC: Recent Labs  Lab 10/15/23 1358 10/16/23 0818 10/19/23 0237 10/19/23 0344 10/20/23 0438  WBC 4.4 2.9* 4.6 4.9 6.6  NEUTROABS 2.3  --  2.3  --   --   HGB 10.6* 9.2* 9.1* 9.1* 8.9*  HCT 32.3* 28.9* 27.9* 28.3* 27.8*  MCV 100.3* 101.0* 101.5* 100.0 101.1*  PLT 179 145* 181 197 182   Basic Metabolic Panel: Recent Labs  Lab 10/15/23 1358 10/16/23 0229 10/19/23 0237 10/19/23 0344 10/20/23 0438  NA 134* 137 135 135 135  K 3.3* 3.6 3.6 3.7 3.3*  CL 102 102 104 104 105  CO2 20* 26 20* 19* 18*  GLUCOSE 80 95 97 92 88  BUN 12 12 16 15 15   CREATININE 1.06* 0.86 1.23* 1.11* 1.47*  CALCIUM  9.6 8.8* 9.2 9.3 8.9  MG 1.4* 2.0  --   --   --    GFR: Estimated Creatinine Clearance: 34.7 mL/min (A) (by C-G formula based on SCr of 1.47 mg/dL (H)). Liver Function Tests: Recent Labs  Lab 10/15/23 1358 10/16/23 0229 10/19/23 0237 10/19/23 0344 10/20/23 0438  AST 23 20 17 18 21   ALT 10 18 10 8 9   ALKPHOS 57 92 61 58 60  BILITOT 1.1 0.5 0.9 1.0 1.1  PROT 6.0* 6.0* 5.2* 5.4* 5.0*  ALBUMIN  3.3* 2.7* 2.5* 2.5* 2.3*   Recent Labs  Lab 10/15/23 1358  LIPASE 34    Recent Results (from the past 240 hours)  Resp panel by RT-PCR (RSV, Flu A&B, Covid) Anterior Nasal Swab     Status: None   Collection Time: 10/12/23  8:07 PM   Specimen: Anterior Nasal Swab  Result Value Ref Range Status   SARS Coronavirus 2 by RT PCR NEGATIVE NEGATIVE Final    Comment: (NOTE) SARS-CoV-2 target nucleic acids are NOT DETECTED.  The SARS-CoV-2 RNA is generally detectable in upper respiratory specimens during the acute phase of infection. The lowest concentration of SARS-CoV-2 viral copies this assay can detect is 138 copies/mL. A negative result does not preclude SARS-Cov-2 infection and should not be used as the sole  basis for treatment or other patient management decisions. A negative result may occur with  improper specimen collection/handling, submission of specimen other than nasopharyngeal swab, presence of viral mutation(s) within the areas targeted by this assay, and inadequate number of viral copies(<138 copies/mL). A negative result must be combined with clinical observations, patient history, and epidemiological information. The expected result is Negative.  Fact Sheet for Patients:  bloggercourse.com  Fact Sheet for Healthcare Providers:  seriousbroker.it  This test is no t yet approved or cleared by the [place]  FDA and  has been authorized for detection and/or diagnosis of SARS-CoV-2 by FDA under an Emergency Use Authorization (EUA). This EUA will remain  in effect (meaning this test can be used) for the duration of the COVID-19 declaration under Section 564(b)(1) of the Act, 21 U.S.C.section 360bbb-3(b)(1), unless the authorization is terminated  or revoked sooner.       Influenza A by PCR NEGATIVE NEGATIVE Final   Influenza B by PCR NEGATIVE NEGATIVE Final    Comment: (NOTE) The Xpert Xpress SARS-CoV-2/FLU/RSV plus assay is intended as an aid in the diagnosis of influenza from Nasopharyngeal swab specimens and should not be used as a sole basis for treatment. Nasal washings and aspirates are unacceptable for Xpert Xpress SARS-CoV-2/FLU/RSV testing.  Fact Sheet for Patients: bloggercourse.com  Fact Sheet for Healthcare Providers: seriousbroker.it  This test is not yet approved or cleared by the [place]  FDA and has been authorized for detection and/or diagnosis of SARS-CoV-2 by FDA under an Emergency Use Authorization (EUA). This EUA will remain in effect (meaning this test can be used) for the duration of the COVID-19 declaration under Section 564(b)(1) of the Act,  21 U.S.C. section 360bbb-3(b)(1), unless the authorization is terminated or revoked.     Resp Syncytial Virus by PCR NEGATIVE NEGATIVE Final    Comment: (NOTE) Fact Sheet for Patients: bloggercourse.com  Fact Sheet for Healthcare Providers: seriousbroker.it  This test is not yet approved or cleared by the [place]  FDA and has been authorized for detection and/or diagnosis of SARS-CoV-2 by FDA under an Emergency Use Authorization (EUA). This EUA will remain in effect (meaning this test can be used) for the duration of the COVID-19 declaration under Section 564(b)(1) of the Act, 21 U.S.C. section 360bbb-3(b)(1), unless the authorization is terminated or revoked.  Performed at [facility], [address] [place], [place] [address]   Resp panel by RT-PCR (RSV, Flu A&B, Covid) Anterior Nasal Swab     Status: None   Collection Time: 10/15/23 10:18 PM   Specimen: Anterior Nasal Swab  Result Value Ref Range Status   SARS Coronavirus 2 by RT PCR NEGATIVE NEGATIVE Final    Comment: (NOTE) SARS-CoV-2 target nucleic acids are NOT DETECTED.  The SARS-CoV-2 RNA  is generally detectable in upper respiratory specimens during the acute phase of infection. The lowest concentration of SARS-CoV-2 viral copies this assay can detect is 138 copies/mL. A negative result does not preclude SARS-Cov-2 infection and should not be used as the sole basis for treatment or other patient management decisions. A negative result may occur with  improper specimen collection/handling, submission of specimen other than nasopharyngeal swab, presence of viral mutation(s) within the areas targeted by this assay, and inadequate number of viral copies(<138 copies/mL). A negative result must be combined with clinical observations, patient history, and epidemiological information. The expected result is Negative.  Fact Sheet for Patients:   bloggercourse.com  Fact Sheet for Healthcare Providers:  seriousbroker.it  This test is no t yet approved or cleared by the [place]  FDA and  has been authorized for detection and/or diagnosis of SARS-CoV-2 by FDA under an Emergency Use Authorization (EUA). This EUA will remain  in effect (meaning this test can be used) for the duration of the COVID-19 declaration under Section 564(b)(1) of the Act, 21 U.S.C.section 360bbb-3(b)(1), unless the authorization is terminated  or revoked sooner.       Influenza A by PCR NEGATIVE NEGATIVE Final   Influenza B by PCR NEGATIVE NEGATIVE Final    Comment: (NOTE) The Xpert Xpress SARS-CoV-2/FLU/RSV plus assay is intended as an aid in the diagnosis of influenza from Nasopharyngeal swab specimens and should not be used as a sole basis for treatment. Nasal washings and aspirates are unacceptable for Xpert Xpress SARS-CoV-2/FLU/RSV testing.  Fact Sheet for Patients: bloggercourse.com  Fact Sheet for Healthcare Providers: seriousbroker.it  This test is not yet approved or cleared by the [place]  FDA and has been authorized for detection and/or diagnosis of SARS-CoV-2 by FDA under an Emergency Use Authorization (EUA). This EUA will remain in effect (meaning this test can be used) for the duration of the COVID-19 declaration under Section 564(b)(1) of the Act, 21 U.S.C. section 360bbb-3(b)(1), unless the authorization is terminated or revoked.     Resp Syncytial Virus by PCR NEGATIVE NEGATIVE Final    Comment: (NOTE) Fact Sheet for Patients: bloggercourse.com  Fact Sheet for Healthcare Providers: seriousbroker.it  This test is not yet approved or cleared by the [place]  FDA and has been authorized for detection and/or diagnosis of SARS-CoV-2 by FDA under an Emergency Use  Authorization (EUA). This EUA will remain in effect (meaning this test can be used) for the duration of the COVID-19 declaration under Section 564(b)(1) of the Act, 21 U.S.C. section 360bbb-3(b)(1), unless the authorization is terminated or revoked.  Performed at [facility], [address], [place], [place] [address]   SARS Coronavirus 2 by RT PCR (hospital order, performed in [facility] hospital lab) *cepheid single result test* Urine, Clean Catch     Status: None   Collection Time: 10/19/23  3:05 AM   Specimen: Urine, Clean Catch; Nasal Swab  Result Value Ref Range Status   SARS Coronavirus 2 by RT PCR NEGATIVE NEGATIVE Final    Comment: (NOTE) SARS-CoV-2 target nucleic acids are NOT DETECTED.  The SARS-CoV-2 RNA is generally detectable in upper and lower respiratory specimens during the acute phase of infection. The lowest concentration of SARS-CoV-2 viral copies this assay can detect is 250 copies / mL. A negative result does not preclude SARS-CoV-2 infection and should not be used as the sole basis for treatment or other patient management decisions.  A negative result may occur with improper specimen collection / handling,  submission of specimen other than nasopharyngeal swab, presence of viral mutation(s) within the areas targeted by this assay, and inadequate number of viral copies (<250 copies / mL). A negative result must be combined with clinical observations, patient history, and epidemiological information.  Fact Sheet for Patients:   roadlaptop.co.za  Fact Sheet for Healthcare Providers: http://kim-miller.com/  This test is not yet approved or  cleared by the [place]  FDA and has been authorized for detection and/or diagnosis of SARS-CoV-2 by FDA under an Emergency Use Authorization (EUA).  This EUA will remain in effect (meaning this test can be used) for the duration of the COVID-19  declaration under Section 564(b)(1) of the Act, 21 U.S.C. section 360bbb-3(b)(1), unless the authorization is terminated or revoked sooner.  Performed at [facility], [address] [place], [place] [address]   Respiratory (~20 pathogens) panel by PCR     Status: None   Collection Time: 10/19/23  3:05 AM   Specimen: Nasopharyngeal Swab; Respiratory  Result Value Ref Range Status   Adenovirus NOT DETECTED NOT DETECTED Final   Coronavirus 229E NOT DETECTED NOT DETECTED Final    Comment: (NOTE) The Coronavirus on the Respiratory Panel, DOES NOT test for the novel  Coronavirus (2019 nCoV)    Coronavirus HKU1 NOT DETECTED NOT DETECTED Final   Coronavirus NL63 NOT DETECTED NOT DETECTED Final   Coronavirus OC43 NOT DETECTED NOT DETECTED Final   Metapneumovirus NOT DETECTED NOT DETECTED Final   Rhinovirus / Enterovirus NOT DETECTED NOT DETECTED Final   Influenza A NOT DETECTED NOT DETECTED Final   Influenza B NOT DETECTED NOT DETECTED Final   Parainfluenza Virus 1 NOT DETECTED NOT DETECTED Final   Parainfluenza Virus 2 NOT DETECTED NOT DETECTED Final   Parainfluenza Virus 3 NOT DETECTED NOT DETECTED Final   Parainfluenza Virus 4 NOT DETECTED NOT DETECTED Final   Respiratory Syncytial Virus NOT DETECTED NOT DETECTED Final   Bordetella pertussis NOT DETECTED NOT DETECTED Final   Bordetella Parapertussis NOT DETECTED NOT DETECTED Final   Chlamydophila pneumoniae NOT DETECTED NOT DETECTED Final   Mycoplasma pneumoniae NOT DETECTED NOT DETECTED Final    Comment: Performed at [facility], [address], [place], [place] [address]      Radiology Studies: DG Abd 1 View Result Date: 10/19/2023 CLINICAL DATA:  NG tube placement. EXAM: ABDOMEN - 1 VIEW COMPARISON:  Earlier same day. FINDINGS: NG tube tip is in the proximal stomach, just below the GE junction. Side port of the NG tube is in the distal esophagus. Mild gaseous bowel distention again noted in the  visualized upper abdomen. Patchy opacity at the left base may be atelectatic or infectious etiology. Right Port-A-Cath again noted. IMPRESSION: NG tube tip is in the proximal stomach, just below the GE junction. Side port of the NG tube is in the distal esophagus. NG tube could be advanced 9-10 cm to place the side port distal to the gastroesophageal junction, as clinically warranted. Electronically Signed   By: Camellia Candle M.D.   On: 10/19/2023 13:17   DG Abd Portable 1V Result Date: 10/19/2023 CLINICAL DATA:  Check gastric catheter placement EXAM: PORTABLE ABDOMEN - 1 VIEW COMPARISON:  None Available. FINDINGS: Nasogastric catheter is noted extending into the right mainstem bronchus and subsequently into the right lower lobe at the level of the diaphragm. This should be withdrawn and readvanced. No pneumothorax is noted. IMPRESSION: Gastric catheter is noted deep in the right lung. This should be withdrawn completely and  readvanced. These results will be called to the ordering clinician or representative by the Radiologist Assistant, and communication documented in the PACS or [employer]. Electronically Signed   By: Oneil Devonshire M.D.   On: 10/19/2023 11:16   DG Chest Port 1 View Result Date: 10/19/2023 CLINICAL DATA:  Altered mental status. EXAM: PORTABLE CHEST 1 VIEW COMPARISON:  July 06, 2023 FINDINGS: There is stable right-sided venous Port-A-Cath positioning. The heart size and mediastinal contours are within normal limits. Both lungs are clear. Multilevel degenerative changes seen throughout the thoracic spine. IMPRESSION: No active disease. Electronically Signed   By: Suzen Dials M.D.   On: 10/19/2023 01:36   MR LUMBAR SPINE W CONTRAST Result Date: 10/18/2023 CLINICAL DATA:  Initial evaluation for persistent edema about the L4 vertebral body fracture. Evaluate for underlying lesion. EXAM: MRI LUMBAR SPINE WITH CONTRAST TECHNIQUE: Multiplanar and multiecho pulse sequences of the lumbar  spine were obtained with intravenous contrast. CONTRAST:  8mL GADAVIST  GADOBUTROL  1 MMOL/ML IV SOLN COMPARISON:  Prior noncontrast MRI from 10/16/2023 FINDINGS: Segmentation: Transitional lumbosacral anatomy with sacralization of the L5 vertebral body. Same numbering system is employed as on previous exam. Alignment: Physiologic with preservation of the normal lumbar lordosis. Vertebrae: Previously identified fracture involving the inferior endplate of L4 again seen, stable from prior MRI. Fractured demonstrates postcontrast enhancement, consistent with an evolving acute to subacute fracture. No convincing or visible underlying mass lesion or other pathologic features by MRI, although evaluation is somewhat limited by the diffuse edema at this level. No other worrisome osseous lesions or abnormal enhancement elsewhere within the lumbar spine. Vertebral body height otherwise maintained. Conus medullaris and cauda equina: Conus extends to the L1 level. Conus and cauda equina appear normal. Paraspinal and other soft tissues: Mild paraspinous edema and enhancement about the L4 vertebral body. No other acute finding. Disc levels: Underlying lumbar spondylosis with multilevel facet arthrosis, described on recent MRI. No new findings. IMPRESSION: 1. Postcontrast enhancement involving the previously identified fracture involving the inferior endplate of L4, consistent with an evolving acute to subacute fracture. No convincing underlying mass lesion or other pathologic features seen on today's exam, although evaluation is somewhat limited by the diffuse enhancement. If there remains concern for a possible underlying occult pathologic lesion, a short interval follow-up MRI could be performed for further evaluation as warranted. 2. No other new abnormality within the lumbar spine. Electronically Signed   By: Morene Hoard M.D.   On: 10/18/2023 23:27   Scheduled Meds:  Chlorhexidine  Gluconate Cloth  6 each Topical  Daily   enoxaparin  (LOVENOX ) injection  40 mg Subcutaneous Q24H   levothyroxine   50 mcg Per Tube QAC breakfast   metoCLOPramide  (REGLAN ) injection  10 mg Intravenous Q8H   polyethylene glycol  17 g Per Tube BID   rosuvastatin   40 mg Per Tube QHS   saccharomyces boulardii  250 mg Per Tube BID   senna-docusate  2 tablet Per Tube BID   thiamine   100 mg Per Tube Daily   Continuous Infusions:  sodium chloride      ceFEPime  (MAXIPIME ) IV 2 g (10/20/23 0414)   metronidazole  500 mg (10/20/23 0227)     LOS: 2 days   Time spent:  Elsie JAYSON Montclair, DO [facility]  If 7PM-7AM, please contact night-coverage [email or web address]  10/20/2023, 8:00 AM

## 2023-10-20 NOTE — Plan of Care (Signed)

## 2023-10-20 NOTE — Plan of Care (Signed)
  Problem: Pain Managment: Goal: General experience of comfort will improve and/or be controlled Outcome: Progressing   Problem: Safety: Goal: Ability to remain free from injury will improve Outcome: Progressing   Problem: Clinical Measurements: Goal: Ability to maintain clinical measurements within normal limits will improve Outcome: Not Progressing

## 2023-10-20 NOTE — Progress Notes (Signed)
 Attempted administration 50 ml (partial order) of Ensure via nasogastric tube (gravity feed).  - Patient was unable to tolerate and began   dry-heaving.  - 50 ml of gastric contents was removed from the stomach due to patient's discomfort. - - -Patient's condition was monitored closely, and appropriate interventions were made to ensure comfort. Will continue will plan of care.  Lavanda Pontes, NP and JEARLDINE Krabbe aware.

## 2023-10-20 NOTE — Plan of Care (Signed)

## 2023-10-21 DIAGNOSIS — R112 Nausea with vomiting, unspecified: Secondary | ICD-10-CM | POA: Diagnosis not present

## 2023-10-21 DIAGNOSIS — R197 Diarrhea, unspecified: Secondary | ICD-10-CM | POA: Diagnosis not present

## 2023-10-21 LAB — COMPREHENSIVE METABOLIC PANEL
ALT: 8 U/L (ref 0–44)
AST: 21 U/L (ref 15–41)
Albumin: 2.1 g/dL — ABNORMAL LOW (ref 3.5–5.0)
Alkaline Phosphatase: 58 U/L (ref 38–126)
Anion gap: 10 (ref 5–15)
BUN: 14 mg/dL (ref 8–23)
CO2: 20 mmol/L — ABNORMAL LOW (ref 22–32)
Calcium: 8.5 mg/dL — ABNORMAL LOW (ref 8.9–10.3)
Chloride: 103 mmol/L (ref 98–111)
Creatinine, Ser: 1.37 mg/dL — ABNORMAL HIGH (ref 0.44–1.00)
GFR, Estimated: 41 mL/min — ABNORMAL LOW (ref >60)
Glucose, Bld: 77 mg/dL (ref 70–99)
Potassium: 2.9 mmol/L — ABNORMAL LOW (ref 3.5–5.1)
Sodium: 133 mmol/L — ABNORMAL LOW (ref 135–145)
Total Bilirubin: 0.9 mg/dL (ref 0.0–1.2)
Total Protein: 4.7 g/dL — ABNORMAL LOW (ref 6.5–8.1)

## 2023-10-21 LAB — CBC
HCT: 26 % — ABNORMAL LOW (ref 36.0–46.0)
Hemoglobin: 8.4 g/dL — ABNORMAL LOW (ref 12.0–15.0)
MCH: 31.9 pg (ref 26.0–34.0)
MCHC: 32.3 g/dL (ref 30.0–36.0)
MCV: 98.9 fL (ref 80.0–100.0)
Platelets: 173 10*3/uL (ref 150–400)
RBC: 2.63 MIL/uL — ABNORMAL LOW (ref 3.87–5.11)
RDW: 15.4 % (ref 11.5–15.5)
WBC: 5.5 10*3/uL (ref 4.0–10.5)
nRBC: 0 % (ref 0.0–0.2)

## 2023-10-21 MED ORDER — FLUCONAZOLE 40 MG/ML PO SUSR
400.0000 mg | Freq: Once | ORAL | Status: DC
  Filled 2023-10-21: qty 10

## 2023-10-21 MED ORDER — POTASSIUM CHLORIDE 20 MEQ PO PACK
40.0000 meq | PACK | Freq: Three times a day (TID) | ORAL | Status: AC
  Administered 2023-10-21 (×2): 40 meq
  Filled 2023-10-21 (×2): qty 2

## 2023-10-21 MED ORDER — LACTATED RINGERS IV BOLUS
250.0000 mL | Freq: Once | INTRAVENOUS | Status: AC
  Administered 2023-10-21: 250 mL via INTRAVENOUS

## 2023-10-21 MED ORDER — NYSTATIN 100000 UNIT/ML MT SUSP
5.0000 mL | Freq: Four times a day (QID) | OROMUCOSAL | Status: DC
  Administered 2023-10-23 (×2): 500000 [IU] via ORAL
  Filled 2023-10-21 (×3): qty 5

## 2023-10-21 MED ORDER — NEPRO/CARBSTEADY PO LIQD
1000.0000 mL | ORAL | Status: DC
  Filled 2023-10-21: qty 1000

## 2023-10-21 MED ORDER — POTASSIUM CHLORIDE 20 MEQ PO PACK: 20.0000 meq | PACK | ORAL | Status: DC

## 2023-10-21 MED ORDER — FLUCONAZOLE 100 MG PO TABS
400.0000 mg | ORAL_TABLET | Freq: Once | ORAL | Status: AC
  Administered 2023-10-21: 400 mg
  Filled 2023-10-21: qty 4

## 2023-10-21 MED ORDER — GLUCERNA 1.5 CAL PO LIQD
1000.0000 mL | ORAL | Status: DC
  Administered 2023-10-21 (×2): 1000 mL
  Filled 2023-10-21 (×2): qty 1000

## 2023-10-21 NOTE — Progress Notes (Signed)
 PROGRESS NOTE    Misty Deleon  FMW:[id number] DOB: 26-Oct-1950 DOA: 10/15/2023 PCP: Salina Clarity, NP   Brief Narrative:  73 y.o. female with medical history significant for cervical cancer on maintenance bevacizumab , T2DM, CKD stage IIIa, HTN, HLD, hypothyroidism, anemia due to B12 deficiency, recent C. difficile infection who presented to the ED for evaluation of persistent nausea, vomiting, and diarrhea. Patient was previously admitted late October 2024 with acute pyelonephritis treated with cephalosporins. She was readmitted shortly after at the beginning of November for C. difficile diarrhea. She was treated with oral vancomycin . CT abdomen/pelvis with contrast showed mild fat stranding surrounding the bladder, mild rectal wall thickening worrisome for proctitis. Nonobstructing right renal calculus, sigmoid colon diverticulosis, stable presacral and perirectal edema.   Assessment & Plan: Principal Problem:   Nausea, vomiting, and diarrhea Active Problems:   Cervical cancer (HCC)   Chronic kidney disease, stage 3a (HCC)   Type 2 diabetes mellitus (HCC)   Vitamin B12 deficiency   Essential hypertension   Hypokalemia   Hypothyroidism   Failure to thrive in adult  SIRS criteria without clear source, not POA  -No clearly identified source given negative imaging thus far -Cultures preliminary negative -Cefepime /Flagyl  ongoing -like 7 to 10-day course -CT abd/pelvis with contrast -generally unremarkable for acute process other than inflammation of the colon concerning for proctitis  Acute metabolic encephalopathy, multifactorial -In the setting of recent narcotics, high fever, malnutrition and possible hospital delirium given poor sleep cycle -Improving somewhat over the past 24 hours with cessation of narcotics and supportive care including fluids and antibiotics  Nausea/vomiting/diarrhea/dysgeusia Rule out delayed gastric emptying/gastroparesis Rule out constipation - Insidious  onset over the last couple months, continue to advance diet as tolerated -Timing per husband coincides with bevacizumab  patient also noted to have elevated B12 which can cause worsening nausea vomiting diarrhea - CT abdomen pelvis shows mild rectal thickening without overt obstruction -in the setting of previous cancer and radiation - No real improvement despite metoclopramide , antiemetics -Gastric emptying study on hold given NG tube placement as below -NG placed t 10/19/2023 for definitive p.o. access - Patient continues to tolerate NG tube feedings well.  Order 1.5 Glucerna at 25 cc/h, increase by 5 cc every 6 hours until goal of 50 cc/h reached.  Low threshold to pause and suction if patient becomes nauseous or has vomiting. -Narcotics discontinued as below per family wishes  -GI panel negative -Continue Senokot/MiraLAX  in the setting of constipation complicated by narcotic use -improving over the last 24 hours  AKI on CKD stage IIIa Hypokalemia -Discontinue IV fluids, given increased p.o. intake with free water  and downtrending creatinine to avoid volume overload -Continue to replete electrolytes as appropriate   Low back pain, chronic: - Follows with [facility], MRI performed while in hospital showing endplate compression at L4, moderate to severe spinal canal narrowing at L3-4 and foraminal narrowing at L3-4 and L4-5 -IR attempting to get patient's procedure preapproved given insurance requires prior authorization.  Procedure on hold now given above clinical status and questionable infectious process -Procedures currently on hold given worsening clinical status as above  Cervical cancer: Follows with Dr. Lonn fusi bevacizumab .  In remission. Type 2 diabetes:, Well-controlled -hold p.o. medications in the interim -restart at discharge  Hypertension: Blood pressure currently well-controlled off home medications. Hyperlipidemia: Continue rosuvastatin . Hypothyroidism: Continue  Synthroid . Anemia/B12 deficiency: Continue supplementation  DVT prophylaxis: enoxaparin  (LOVENOX ) injection 40 mg Start: 10/15/23 2200 Code Status:   Code Status: Full Code Family Communication: Husband at bedside  Status is: Inpatient  Dispo: The patient is from: Home              Anticipated d/c is to: To be determined -likely SNF              Anticipated d/c date is: 72+ hours              Patient currently not medically stable for discharge  Consultants:  Oncology (IR/Ortho sidelined in regards to back pain/spinal narrowing)  Procedures:  None  Antimicrobials:  None  Subjective: No acute issues or events overnight, patient noted to be febrile again but mentation appears to be improving, alert and oriented to person today, much more interactive per husband at bedside.  Objective: Vitals:   10/21/23 0325 10/21/23 0423 10/21/23 0500 10/21/23 0528  BP: 95/62 (!) 100/56  116/74  Pulse: (!) 111 62  (!) 110  Resp:  15  17  Temp:  98.4 F (36.9 C)  99.1 F (37.3 C)  TempSrc:  Oral  Oral  SpO2:  95%  100%  Weight:   85.9 kg   Height:        Intake/Output Summary (Last 24 hours) at 10/21/2023 0745 Last data filed at 10/21/2023 0500 Gross per 24 hour  Intake 2249.18 ml  Output 0 ml  Net 2249.18 ml   Filed Weights   10/15/23 1151 10/17/23 0500 10/21/23 0500  Weight: 83.5 kg 84.9 kg 85.9 kg    Examination:  General: Awake and alert, resting comfortably in bed in no acute distress. HEENT: NG tube right nare Neck:  Without mass or deformity.  Trachea is midline. Lungs:  Clear to auscultate bilaterally without rhonchi, wheeze, or rales. Heart:  Regular rate and rhythm.  Without murmurs, rubs, or gallops. Abdomen:  Soft, nontender, nondistended. Extremities: Without cyanosis, clubbing, or obvious deformity.  Scant edema left upper extremity Skin:  Warm and dry, no erythema.  Data Reviewed: I have personally reviewed following labs and imaging studies  CBC: Recent  Labs  Lab 10/15/23 1358 10/16/23 0818 10/19/23 0237 10/19/23 0344 10/20/23 0438 10/21/23 0500  WBC 4.4 2.9* 4.6 4.9 6.6 5.5  NEUTROABS 2.3  --  2.3  --   --   --   HGB 10.6* 9.2* 9.1* 9.1* 8.9* 8.4*  HCT 32.3* 28.9* 27.9* 28.3* 27.8* 26.0*  MCV 100.3* 101.0* 101.5* 100.0 101.1* 98.9  PLT 179 145* 181 197 182 173   Basic Metabolic Panel: Recent Labs  Lab 10/15/23 1358 10/16/23 0229 10/19/23 0237 10/19/23 0344 10/20/23 0438 10/21/23 0500  NA 134* 137 135 135 135 133*  K 3.3* 3.6 3.6 3.7 3.3* 2.9*  CL 102 102 104 104 105 103  CO2 20* 26 20* 19* 18* 20*  GLUCOSE 80 95 97 92 88 77  BUN 12 12 16 15 15 14   CREATININE 1.06* 0.86 1.23* 1.11* 1.47* 1.37*  CALCIUM  9.6 8.8* 9.2 9.3 8.9 8.5*  MG 1.4* 2.0  --   --  1.1*  --   PHOS  --   --   --   --  3.3  --    GFR: Estimated Creatinine Clearance: 37.5 mL/min (A) (by C-G formula based on SCr of 1.37 mg/dL (H)). Liver Function Tests: Recent Labs  Lab 10/16/23 0229 10/19/23 0237 10/19/23 0344 10/20/23 0438 10/21/23 0500  AST 20 17 18 21 21   ALT 18 10 8 9 8   ALKPHOS 92 61 58 60 58  BILITOT 0.5 0.9 1.0 1.1 0.9  PROT 6.0* 5.2*  5.4* 5.0* 4.7*  ALBUMIN  2.7* 2.5* 2.5* 2.3* 2.1*   Recent Labs  Lab 10/15/23 1358  LIPASE 34    Recent Results (from the past 240 hours)  Resp panel by RT-PCR (RSV, Flu A&B, Covid) Anterior Nasal Swab     Status: None   Collection Time: 10/12/23  8:07 PM   Specimen: Anterior Nasal Swab  Result Value Ref Range Status   SARS Coronavirus 2 by RT PCR NEGATIVE NEGATIVE Final    Comment: (NOTE) SARS-CoV-2 target nucleic acids are NOT DETECTED.  The SARS-CoV-2 RNA is generally detectable in upper respiratory specimens during the acute phase of infection. The lowest concentration of SARS-CoV-2 viral copies this assay can detect is 138 copies/mL. A negative result does not preclude SARS-Cov-2 infection and should not be used as the sole basis for treatment or other patient management decisions. A  negative result may occur with  improper specimen collection/handling, submission of specimen other than nasopharyngeal swab, presence of viral mutation(s) within the areas targeted by this assay, and inadequate number of viral copies(<138 copies/mL). A negative result must be combined with clinical observations, patient history, and epidemiological information. The expected result is Negative.  Fact Sheet for Patients:  bloggercourse.com  Fact Sheet for Healthcare Providers:  seriousbroker.it  This test is no t yet approved or cleared by the [place]  FDA and  has been authorized for detection and/or diagnosis of SARS-CoV-2 by FDA under an Emergency Use Authorization (EUA). This EUA will remain  in effect (meaning this test can be used) for the duration of the COVID-19 declaration under Section 564(b)(1) of the Act, 21 U.S.C.section 360bbb-3(b)(1), unless the authorization is terminated  or revoked sooner.       Influenza A by PCR NEGATIVE NEGATIVE Final   Influenza B by PCR NEGATIVE NEGATIVE Final    Comment: (NOTE) The Xpert Xpress SARS-CoV-2/FLU/RSV plus assay is intended as an aid in the diagnosis of influenza from Nasopharyngeal swab specimens and should not be used as a sole basis for treatment. Nasal washings and aspirates are unacceptable for Xpert Xpress SARS-CoV-2/FLU/RSV testing.  Fact Sheet for Patients: bloggercourse.com  Fact Sheet for Healthcare Providers: seriousbroker.it  This test is not yet approved or cleared by the [place]  FDA and has been authorized for detection and/or diagnosis of SARS-CoV-2 by FDA under an Emergency Use Authorization (EUA). This EUA will remain in effect (meaning this test can be used) for the duration of the COVID-19 declaration under Section 564(b)(1) of the Act, 21 U.S.C. section 360bbb-3(b)(1), unless the authorization  is terminated or revoked.     Resp Syncytial Virus by PCR NEGATIVE NEGATIVE Final    Comment: (NOTE) Fact Sheet for Patients: bloggercourse.com  Fact Sheet for Healthcare Providers: seriousbroker.it  This test is not yet approved or cleared by the [place]  FDA and has been authorized for detection and/or diagnosis of SARS-CoV-2 by FDA under an Emergency Use Authorization (EUA). This EUA will remain in effect (meaning this test can be used) for the duration of the COVID-19 declaration under Section 564(b)(1) of the Act, 21 U.S.C. section 360bbb-3(b)(1), unless the authorization is terminated or revoked.  Performed at [facility], [address] [place], [place] [address]   Resp panel by RT-PCR (RSV, Flu A&B, Covid) Anterior Nasal Swab     Status: None   Collection Time: 10/15/23 10:18 PM   Specimen: Anterior Nasal Swab  Result Value Ref Range Status   SARS Coronavirus 2 by RT PCR NEGATIVE NEGATIVE Final  Comment: (NOTE) SARS-CoV-2 target nucleic acids are NOT DETECTED.  The SARS-CoV-2 RNA is generally detectable in upper respiratory specimens during the acute phase of infection. The lowest concentration of SARS-CoV-2 viral copies this assay can detect is 138 copies/mL. A negative result does not preclude SARS-Cov-2 infection and should not be used as the sole basis for treatment or other patient management decisions. A negative result may occur with  improper specimen collection/handling, submission of specimen other than nasopharyngeal swab, presence of viral mutation(s) within the areas targeted by this assay, and inadequate number of viral copies(<138 copies/mL). A negative result must be combined with clinical observations, patient history, and epidemiological information. The expected result is Negative.  Fact Sheet for Patients:  bloggercourse.com  Fact Sheet for  Healthcare Providers:  seriousbroker.it  This test is no t yet approved or cleared by the [place]  FDA and  has been authorized for detection and/or diagnosis of SARS-CoV-2 by FDA under an Emergency Use Authorization (EUA). This EUA will remain  in effect (meaning this test can be used) for the duration of the COVID-19 declaration under Section 564(b)(1) of the Act, 21 U.S.C.section 360bbb-3(b)(1), unless the authorization is terminated  or revoked sooner.       Influenza A by PCR NEGATIVE NEGATIVE Final   Influenza B by PCR NEGATIVE NEGATIVE Final    Comment: (NOTE) The Xpert Xpress SARS-CoV-2/FLU/RSV plus assay is intended as an aid in the diagnosis of influenza from Nasopharyngeal swab specimens and should not be used as a sole basis for treatment. Nasal washings and aspirates are unacceptable for Xpert Xpress SARS-CoV-2/FLU/RSV testing.  Fact Sheet for Patients: bloggercourse.com  Fact Sheet for Healthcare Providers: seriousbroker.it  This test is not yet approved or cleared by the [place]  FDA and has been authorized for detection and/or diagnosis of SARS-CoV-2 by FDA under an Emergency Use Authorization (EUA). This EUA will remain in effect (meaning this test can be used) for the duration of the COVID-19 declaration under Section 564(b)(1) of the Act, 21 U.S.C. section 360bbb-3(b)(1), unless the authorization is terminated or revoked.     Resp Syncytial Virus by PCR NEGATIVE NEGATIVE Final    Comment: (NOTE) Fact Sheet for Patients: bloggercourse.com  Fact Sheet for Healthcare Providers: seriousbroker.it  This test is not yet approved or cleared by the [place]  FDA and has been authorized for detection and/or diagnosis of SARS-CoV-2 by FDA under an Emergency Use Authorization (EUA). This EUA will remain in effect (meaning this  test can be used) for the duration of the COVID-19 declaration under Section 564(b)(1) of the Act, 21 U.S.C. section 360bbb-3(b)(1), unless the authorization is terminated or revoked.  Performed at [facility], [address], [place], [place] [address]   SARS Coronavirus 2 by RT PCR (hospital order, performed in [facility] hospital lab) *cepheid single result test* Urine, Clean Catch     Status: None   Collection Time: 10/19/23  3:05 AM   Specimen: Urine, Clean Catch; Nasal Swab  Result Value Ref Range Status   SARS Coronavirus 2 by RT PCR NEGATIVE NEGATIVE Final    Comment: (NOTE) SARS-CoV-2 target nucleic acids are NOT DETECTED.  The SARS-CoV-2 RNA is generally detectable in upper and lower respiratory specimens during the acute phase of infection. The lowest concentration of SARS-CoV-2 viral copies this assay can detect is 250 copies / mL. A negative result does not preclude SARS-CoV-2 infection and should not be used as the sole basis for treatment or other patient management  decisions.  A negative result may occur with improper specimen collection / handling, submission of specimen other than nasopharyngeal swab, presence of viral mutation(s) within the areas targeted by this assay, and inadequate number of viral copies (<250 copies / mL). A negative result must be combined with clinical observations, patient history, and epidemiological information.  Fact Sheet for Patients:   roadlaptop.co.za  Fact Sheet for Healthcare Providers: http://kim-miller.com/  This test is not yet approved or  cleared by the [place]  FDA and has been authorized for detection and/or diagnosis of SARS-CoV-2 by FDA under an Emergency Use Authorization (EUA).  This EUA will remain in effect (meaning this test can be used) for the duration of the COVID-19 declaration under Section 564(b)(1) of the Act, 21 U.S.C. section  360bbb-3(b)(1), unless the authorization is terminated or revoked sooner.  Performed at [facility], [address], [place], [place] [address]   Respiratory (~20 pathogens) panel by PCR     Status: None   Collection Time: 10/19/23  3:05 AM   Specimen: Nasopharyngeal Swab; Respiratory  Result Value Ref Range Status   Adenovirus NOT DETECTED NOT DETECTED Final   Coronavirus 229E NOT DETECTED NOT DETECTED Final    Comment: (NOTE) The Coronavirus on the Respiratory Panel, DOES NOT test for the novel  Coronavirus (2019 nCoV)    Coronavirus HKU1 NOT DETECTED NOT DETECTED Final   Coronavirus NL63 NOT DETECTED NOT DETECTED Final   Coronavirus OC43 NOT DETECTED NOT DETECTED Final   Metapneumovirus NOT DETECTED NOT DETECTED Final   Rhinovirus / Enterovirus NOT DETECTED NOT DETECTED Final   Influenza A NOT DETECTED NOT DETECTED Final   Influenza B NOT DETECTED NOT DETECTED Final   Parainfluenza Virus 1 NOT DETECTED NOT DETECTED Final   Parainfluenza Virus 2 NOT DETECTED NOT DETECTED Final   Parainfluenza Virus 3 NOT DETECTED NOT DETECTED Final   Parainfluenza Virus 4 NOT DETECTED NOT DETECTED Final   Respiratory Syncytial Virus NOT DETECTED NOT DETECTED Final   Bordetella pertussis NOT DETECTED NOT DETECTED Final   Bordetella Parapertussis NOT DETECTED NOT DETECTED Final   Chlamydophila pneumoniae NOT DETECTED NOT DETECTED Final   Mycoplasma pneumoniae NOT DETECTED NOT DETECTED Final    Comment: Performed at [facility], [address], [place], [place] [address]  Culture, blood (x 2)     Status: None (Preliminary result)   Collection Time: 10/19/23  3:44 AM   Specimen: BLOOD  Result Value Ref Range Status   Specimen Description   Final    BLOOD RIGHT ANTECUBITAL Performed at [facility], [address] [place], [place] [address]    Special Requests   Final    BOTTLES DRAWN AEROBIC AND ANAEROBIC Blood Culture results may not be optimal  due to an inadequate volume of blood received in culture bottles Performed at [facility], [address] [place], [place] [address]    Culture   Final    NO GROWTH 1 DAY Performed at [facility], [address] [place], [place] [address]    Report Status PENDING  Incomplete  Culture, blood (x 2)     Status: None (Preliminary result)   Collection Time: 10/19/23  3:44 AM   Specimen: BLOOD RIGHT HAND  Result Value Ref Range Status   Specimen Description   Final    BLOOD RIGHT HAND Performed at [facility], [address] [place], [place] [address]    Special Requests  Final    BOTTLES DRAWN AEROBIC AND ANAEROBIC Blood Culture adequate volume Performed at [facility], [address], [place], [place] [address]    Culture   Final    NO GROWTH 1 DAY Performed at [facility], [address], [place], [place] [address]    Report Status PENDING  Incomplete  Gastrointestinal Panel by PCR , Stool     Status: None   Collection Time: 10/20/23  2:35 AM   Specimen: Stool  Result Value Ref Range Status   Campylobacter species NOT DETECTED NOT DETECTED Final   Plesimonas shigelloides NOT DETECTED NOT DETECTED Final   Salmonella species NOT DETECTED NOT DETECTED Final   Yersinia enterocolitica NOT DETECTED NOT DETECTED Final   Vibrio species NOT DETECTED NOT DETECTED Final   Vibrio cholerae NOT DETECTED NOT DETECTED Final   Enteroaggregative E coli (EAEC) NOT DETECTED NOT DETECTED Final   Enteropathogenic E coli (EPEC) NOT DETECTED NOT DETECTED Final   Enterotoxigenic E coli (ETEC) NOT DETECTED NOT DETECTED Final   Shiga like toxin producing E coli (STEC) NOT DETECTED NOT DETECTED Final   Shigella/Enteroinvasive E coli (EIEC) NOT DETECTED NOT DETECTED Final   Cryptosporidium NOT DETECTED NOT DETECTED Final   Cyclospora cayetanensis NOT DETECTED NOT DETECTED Final   Entamoeba histolytica NOT DETECTED NOT DETECTED  Final   Giardia lamblia NOT DETECTED NOT DETECTED Final   Adenovirus F40/41 NOT DETECTED NOT DETECTED Final   Astrovirus NOT DETECTED NOT DETECTED Final   Norovirus GI/GII NOT DETECTED NOT DETECTED Final   Rotavirus A NOT DETECTED NOT DETECTED Final   Sapovirus (I, II, IV, and V) NOT DETECTED NOT DETECTED Final    Comment: Performed at [facility], [address] [place], [place] [address]      Radiology Studies: CT ABDOMEN PELVIS W CONTRAST Result Date: 10/20/2023 CLINICAL DATA:  Abdominal pain.  Constipation. EXAM: CT ABDOMEN AND PELVIS WITH CONTRAST TECHNIQUE: Multidetector CT imaging of the abdomen and pelvis was performed using the standard protocol following bolus administration of intravenous contrast. RADIATION DOSE REDUCTION: This exam was performed according to the departmental dose-optimization program which includes automated exposure control, adjustment of the mA and/or kV according to patient size and/or use of iterative reconstruction technique. CONTRAST:  OMNIPAQUE  IOHEXOL  300 MG/ML  SOLN COMPARISON:  10/15/2023 FINDINGS: Lower Chest: No acute findings. Hepatobiliary: No suspicious hepatic masses identified. Gallbladder is unremarkable. No evidence of biliary ductal dilatation. Pancreas:  No mass or inflammatory changes. Spleen: Within normal limits in size and appearance. Adrenals/Urinary Tract: No suspicious masses identified. Right renal calculi are again seen, largest measuring 8 mm. No evidence of ureteral calculi or hydronephrosis. Unremarkable unopacified urinary bladder. Stomach/Bowel: Enteric tube is seen with tip in the distal gastric antrum. Mild rectal wall thickening and perirectal/presacral soft tissue stranding are seen, suspicious for proctitis. No evidence of perirectal fistula or abscess. Diverticulosis is seen mainly involving the sigmoid colon, however there is no evidence of diverticulitis. No significant stool burden seen. Vascular/Lymphatic: No  pathologically enlarged lymph nodes. No acute vascular findings. Reproductive:  No mass or other significant abnormality. Other:  Mild diffuse body wall edema noted. Musculoskeletal:  No suspicious bone lesions identified. IMPRESSION: Mild rectal wall thickening and perirectal/presacral soft tissue stranding, suspicious for proctitis. No evidence of perirectal fistula or abscess. Colonic diverticulosis, without radiographic evidence of diverticulitis. No significant stool burden. Right nephrolithiasis. No evidence of ureteral calculi or hydronephrosis. Mild diffuse body wall edema. Electronically Signed   By:  Norleen DELENA Kil M.D.   On: 10/20/2023 17:38   DG Abd 1 View Result Date: 10/19/2023 CLINICAL DATA:  NG tube placement. EXAM: ABDOMEN - 1 VIEW COMPARISON:  Earlier same day. FINDINGS: NG tube tip is in the proximal stomach, just below the GE junction. Side port of the NG tube is in the distal esophagus. Mild gaseous bowel distention again noted in the visualized upper abdomen. Patchy opacity at the left base may be atelectatic or infectious etiology. Right Port-A-Cath again noted. IMPRESSION: NG tube tip is in the proximal stomach, just below the GE junction. Side port of the NG tube is in the distal esophagus. NG tube could be advanced 9-10 cm to place the side port distal to the gastroesophageal junction, as clinically warranted. Electronically Signed   By: Camellia Candle M.D.   On: 10/19/2023 13:17   DG Abd Portable 1V Result Date: 10/19/2023 CLINICAL DATA:  Check gastric catheter placement EXAM: PORTABLE ABDOMEN - 1 VIEW COMPARISON:  None Available. FINDINGS: Nasogastric catheter is noted extending into the right mainstem bronchus and subsequently into the right lower lobe at the level of the diaphragm. This should be withdrawn and readvanced. No pneumothorax is noted. IMPRESSION: Gastric catheter is noted deep in the right lung. This should be withdrawn completely and readvanced. These results will be  called to the ordering clinician or representative by the Radiologist Assistant, and communication documented in the PACS or [employer]. Electronically Signed   By: Oneil Devonshire M.D.   On: 10/19/2023 11:16   Scheduled Meds:  Chlorhexidine  Gluconate Cloth  6 each Topical Daily   enoxaparin  (LOVENOX ) injection  40 mg Subcutaneous Q24H   feeding supplement  1 Container Oral TID   levothyroxine   50 mcg Per Tube QAC breakfast   metoCLOPramide  (REGLAN ) injection  10 mg Intravenous Q8H   polyethylene glycol  17 g Per Tube BID   potassium chloride   20 mEq Per Tube Q2H   rosuvastatin   40 mg Per Tube QHS   saccharomyces boulardii  250 mg Per Tube BID   senna-docusate  2 tablet Per Tube BID   thiamine   100 mg Per Tube Daily   Continuous Infusions:  ceFEPime  (MAXIPIME ) IV 2 g (10/21/23 0325)   metronidazole  500 mg (10/21/23 0327)     LOS: 3 days   Time spent:  Elsie JAYSON [place], DO [facility]  If 7PM-7AM, please contact night-coverage [email or web address]  10/21/2023, 7:45 AM

## 2023-10-21 NOTE — Progress Notes (Signed)
 Red MEWS  10/20/23 2349  Assess: MEWS Score  Temp (!) 101.5 F (38.6 C)  BP 106/62  MAP (mmHg) 76  Pulse Rate (!) 119  Resp 19  Level of Consciousness Alert  SpO2 96 %  O2 Device Room Air  Assess: MEWS Score  MEWS Temp 2  MEWS Systolic 0  MEWS Pulse 2  MEWS RR 0  MEWS LOC 0  MEWS Score 4  MEWS Score Color Red  Assess: if the MEWS score is Yellow or Red  Were vital signs accurate and taken at a resting state? Yes  Does the patient meet 2 or more of the SIRS criteria? Yes  Does the patient have a confirmed or suspected source of infection? No  MEWS guidelines implemented  Yes, red  Treat  MEWS Interventions Considered administering scheduled or prn medications/treatments as ordered  Take Vital Signs  Increase Vital Sign Frequency  Red: Q1hr x2, continue Q4hrs until patient remains green for 12hrs  Escalate  MEWS: Escalate Red: Discuss with charge nurse and notify provider. Consider notifying RRT. If remains red for 2 hours consider need for higher level of care  Notify: Charge Nurse/RN  Name of Charge Nurse/RN Notified Engineering Geologist  Provider Notification  Provider Name/Title A. Andrez, NP  Date Provider Notified 10/21/23  Time Provider Notified 0018  Method of Notification Page (secure chat)  Notification Reason Change in status (Red MEWS)  Provider response Evaluate remotely;No new orders  Date of Provider Response 10/21/23  Time of Provider Response 0127  Assess: SIRS CRITERIA  SIRS Temperature  1  SIRS Respirations  0  SIRS Pulse 1  SIRS WBC 0  SIRS Score Sum  2

## 2023-10-21 NOTE — Plan of Care (Signed)
  Problem: Education: Goal: Knowledge of General Education information will improve Description: Including pain rating scale, medication(s)/side effects and non-pharmacologic comfort measures Outcome: Progressing   Problem: Health Behavior/Discharge Planning: Goal: Ability to manage health-related needs will improve Outcome: Progressing   Problem: Clinical Measurements: Goal: Ability to maintain clinical measurements within normal limits will improve Outcome: Progressing Goal: Will remain free from infection Outcome: Progressing Goal: Diagnostic test results will improve Outcome: Progressing Goal: Respiratory complications will improve Outcome: Progressing Goal: Cardiovascular complication will be avoided Outcome: Progressing   Problem: Activity: Goal: Risk for activity intolerance will decrease Outcome: Progressing   Problem: Nutrition: Goal: Adequate nutrition will be maintained Outcome: Progressing   Problem: Elimination: Goal: Will not experience complications related to bowel motility Outcome: Progressing Goal: Will not experience complications related to urinary retention Outcome: Progressing   Problem: Pain Managment: Goal: General experience of comfort will improve and/or be controlled Outcome: Progressing   Problem: Safety: Goal: Ability to remain free from injury will improve Outcome: Progressing   Problem: Skin Integrity: Goal: Risk for impaired skin integrity will decrease Outcome: Progressing   Problem: Education: Goal: Knowledge of General Education information will improve Description: Including pain rating scale, medication(s)/side effects and non-pharmacologic comfort measures Outcome: Progressing   Problem: Health Behavior/Discharge Planning: Goal: Ability to manage health-related needs will improve Outcome: Progressing   Problem: Clinical Measurements: Goal: Ability to maintain clinical measurements within normal limits will  improve Outcome: Progressing Goal: Will remain free from infection Outcome: Progressing Goal: Diagnostic test results will improve Outcome: Progressing Goal: Cardiovascular complication will be avoided Outcome: Progressing

## 2023-10-21 NOTE — Plan of Care (Signed)
  Problem: Clinical Measurements: Goal: Diagnostic test results will improve Outcome: Progressing Goal: Respiratory complications will improve Outcome: Progressing Goal: Cardiovascular complication will be avoided Outcome: Progressing   Problem: Activity: Goal: Risk for activity intolerance will decrease Outcome: Progressing   Problem: Nutrition: Goal: Adequate nutrition will be maintained Outcome: Progressing   Problem: Elimination: Goal: Will not experience complications related to urinary retention Outcome: Progressing   Problem: Pain Managment: Goal: General experience of comfort will improve and/or be controlled Outcome: Progressing   Problem: Safety: Goal: Ability to remain free from injury will improve Outcome: Progressing   Problem: Skin Integrity: Goal: Risk for impaired skin integrity will decrease Outcome: Progressing   Problem: Clinical Measurements: Goal: Ability to maintain clinical measurements within normal limits will improve Outcome: Progressing Goal: Diagnostic test results will improve 10/21/2023 0129 by Enola Bald, RN Outcome: Progressing 10/21/2023 0128 by Enola Bald, RN Outcome: Progressing Goal: Cardiovascular complication will be avoided Outcome: Progressing   Problem: Activity: Goal: Risk for activity intolerance will decrease 10/21/2023 0129 by Enola Bald, RN Outcome: Progressing 10/21/2023 0128 by Enola Bald, RN Outcome: Progressing   Problem: Nutrition: Goal: Adequate nutrition will be maintained Outcome: Progressing   Problem: Coping: Goal: Level of anxiety will decrease Outcome: Progressing   Problem: Elimination: Goal: Will not experience complications related to urinary retention Outcome: Progressing   Problem: Pain Managment: Goal: General experience of comfort will improve and/or be controlled Outcome: Progressing   Problem: Safety: Goal: Ability to remain free from injury  will improve Outcome: Progressing   Problem: Skin Integrity: Goal: Risk for impaired skin integrity will decrease Outcome: Progressing

## 2023-10-21 NOTE — Progress Notes (Signed)
   10/21/23 0206  Vitals  Temp 99.8 F (37.7 C)  Temp Source Oral  BP (!) 89/64  MAP (mmHg) 73  BP Location Left Arm  BP Method Automatic  Patient Position (if appropriate) Lying  Pulse Rate (!) 116  Pulse Rate Source Monitor  Resp 19  MEWS COLOR  MEWS Score Color Yellow  Oxygen Therapy  SpO2 100 %  O2 Device Room Air  MEWS Score  MEWS Temp 0  MEWS Systolic 1  MEWS Pulse 2  MEWS RR 0  MEWS LOC 0  MEWS Score 3  Provider Notification  Provider Name/Title A. Andrez, NP  Date Provider Notified 10/21/23  Time Provider Notified [phone]  Method of Notification Page (secure chat)  Notification Reason Other (Comment) (BP 89/64)  Provider response Evaluate remotely;Other (Comment) (rechecke BP in an hour and follow up if still soft)  Date of Provider Response 10/21/23  Time of Provider Response [phone]

## 2023-10-22 DIAGNOSIS — R112 Nausea with vomiting, unspecified: Secondary | ICD-10-CM | POA: Diagnosis not present

## 2023-10-22 DIAGNOSIS — R197 Diarrhea, unspecified: Secondary | ICD-10-CM | POA: Diagnosis not present

## 2023-10-22 LAB — CBC
HCT: 25.8 % — ABNORMAL LOW (ref 36.0–46.0)
Hemoglobin: 8.4 g/dL — ABNORMAL LOW (ref 12.0–15.0)
MCH: 32.1 pg (ref 26.0–34.0)
MCHC: 32.6 g/dL (ref 30.0–36.0)
MCV: 98.5 fL (ref 80.0–100.0)
Platelets: 190 10*3/uL (ref 150–400)
RBC: 2.62 MIL/uL — ABNORMAL LOW (ref 3.87–5.11)
RDW: 15.8 % — ABNORMAL HIGH (ref 11.5–15.5)
WBC: 4.7 10*3/uL (ref 4.0–10.5)
nRBC: 0 % (ref 0.0–0.2)

## 2023-10-22 LAB — COMPREHENSIVE METABOLIC PANEL
ALT: 11 U/L (ref 0–44)
AST: 28 U/L (ref 15–41)
Albumin: 2 g/dL — ABNORMAL LOW (ref 3.5–5.0)
Alkaline Phosphatase: 78 U/L (ref 38–126)
Anion gap: 8 (ref 5–15)
BUN: 14 mg/dL (ref 8–23)
CO2: 19 mmol/L — ABNORMAL LOW (ref 22–32)
Calcium: 8.6 mg/dL — ABNORMAL LOW (ref 8.9–10.3)
Chloride: 106 mmol/L (ref 98–111)
Creatinine, Ser: 1.58 mg/dL — ABNORMAL HIGH (ref 0.44–1.00)
GFR, Estimated: 35 mL/min — ABNORMAL LOW (ref >60)
Glucose, Bld: 80 mg/dL (ref 70–99)
Potassium: 3.4 mmol/L — ABNORMAL LOW (ref 3.5–5.1)
Sodium: 133 mmol/L — ABNORMAL LOW (ref 135–145)
Total Bilirubin: 1 mg/dL (ref 0.0–1.2)
Total Protein: 4.7 g/dL — ABNORMAL LOW (ref 6.5–8.1)

## 2023-10-22 LAB — MAGNESIUM: Magnesium: 1.4 mg/dL — ABNORMAL LOW (ref 1.7–2.4)

## 2023-10-22 LAB — GLUCOSE, CAPILLARY
Glucose-Capillary: 97 mg/dL (ref 70–99)
Glucose-Capillary: 148 mg/dL — ABNORMAL HIGH (ref 70–99)

## 2023-10-22 LAB — PHOSPHORUS: Phosphorus: 1.6 mg/dL — ABNORMAL LOW (ref 2.5–4.6)

## 2023-10-22 MED ORDER — ZINC OXIDE 12.8 % EX OINT
TOPICAL_OINTMENT | CUTANEOUS | Status: DC | PRN
Start: 2023-10-22 — End: 2023-10-26

## 2023-10-22 MED ORDER — IBUPROFEN 200 MG PO TABS
400.0000 mg | ORAL_TABLET | Freq: Once | ORAL | Status: AC
  Administered 2023-10-23: 400 mg
  Filled 2023-10-22: qty 2

## 2023-10-22 MED ORDER — ACETAMINOPHEN 160 MG/5ML PO SOLN
650.0000 mg | Freq: Four times a day (QID) | ORAL | Status: DC | PRN
  Administered 2023-10-22: 650 mg
  Filled 2023-10-22: qty 20.3

## 2023-10-22 MED ORDER — PROSOURCE TF20 ENFIT COMPATIBL EN LIQD
60.0000 mL | Freq: Every day | ENTERAL | Status: DC
  Administered 2023-10-23: 60 mL
  Filled 2023-10-22: qty 60

## 2023-10-22 MED ORDER — SODIUM CHLORIDE 0.9 % IV BOLUS
500.0000 mL | Freq: Once | INTRAVENOUS | Status: AC
  Administered 2023-10-22: 500 mL via INTRAVENOUS

## 2023-10-22 MED ORDER — OSMOLITE 1.5 CAL PO LIQD
1000.0000 mL | ORAL | Status: DC
  Administered 2023-10-22: 1000 mL
  Filled 2023-10-22: qty 1000

## 2023-10-22 MED ORDER — ACETAMINOPHEN 650 MG RE SUPP: 650.0000 mg | Freq: Four times a day (QID) | RECTAL | Status: DC | PRN

## 2023-10-22 MED ORDER — OSMOLITE 1.5 CAL PO LIQD
1000.0000 mL | ORAL | Status: DC
  Filled 2023-10-22: qty 1000

## 2023-10-22 NOTE — Plan of Care (Signed)

## 2023-10-22 NOTE — Progress Notes (Signed)
 Initial Nutrition Assessment  DOCUMENTATION CODES:   Non-severe (moderate) malnutrition in context of chronic illness  INTERVENTION:  - New TF regimen as below: Osmolite 1.5 at 55 ml/h (1320 ml per day) *Starting at 92mL/hr and increase by 10mL Q6H Prosource TF20 60 ml daily Provides 2060 kcal, 103 gm protein, 1006 ml free water  daily  - Monitor magnesium , potassium, and phosphorus BID for at least 3 days, MD to replete as needed, as pt is at risk for refeeding syndrome. - Continue 100mg  thiamine  due to risk of refeeding.   - Checking zinc  due to reported taste changes and ongoing diarrhea.  - Monitor weight trends.  - Will monitor for diet advancement.   NUTRITION DIAGNOSIS:   Moderate Malnutrition related to chronic illness as evidenced by energy intake < or equal to 75% for > or equal to 1 month, percent weight loss (8% in 3.5 months).  GOAL:   Patient will meet greater than or equal to 90% of their needs  MONITOR:   Diet advancement, Labs, Weight trends, TF tolerance  REASON FOR ASSESSMENT:   New TF    ASSESSMENT:   73 y.o. female with PMH significant for cervical cancer on maintenance bevacizumab , T2DM, CKD stage IIIa, HTN, HLD, hypothyroidism, anemia due to B12 deficiency, recent C. difficile infection who presented to the ED for evaluation of persistent nausea, vomiting, and diarrhea. Admitted for sepsis, acute encephalopathy, and ongoing N/V/D.  2/3 Admit; FLD 2/5 Regular diet 2/7 NPO, NGT placed 2/9 Glucerna started at 19mL/hr  Patient sleeping at time of visit, TF infusing via NGT at 25mL/hr. Husband at bedside provided all nutrition history.   Reports her recent UBW as around 180# but notes she has lost weight over the past 3 years. Shares she initially lost weight during cervical cancer treatment but afterwards was able to gain some back. Has been losing weight recently due to not being able to eat very much.  Per EMR, patient has had a 15# or 8% weight  loss within the past 3.5 months, which is significant for the time frame.   Husband reports patient's intake has really declined since around November. States she has only been eating bites of food and drinking 1 Ensure or Boost daily. Her biggest issue has been taste changes, which has been going on since October. Notes she has a metallic taste and that things don't taste the way they should. Given this report and that patient has had ongoing diarrhea, will check zinc .   Patient was documented to have consumed 25-100% of meals prior to being made NPO on 2/7. However, husband reports she was doing minimal meals with foods like broth or a couple bites of cheesecake.   NGT placed 2/7 and Glucerna 1.5 started at 22mL/hr yesterday. Patient appears to be tolerating fairly well and increases were scheduled but husband weary to increase per discussion with MD.   Discussed changing to a standard formula Osmolite 1.5 as HA1C 5.1 so Glucerna note indicated. Plan to continue planned increases of 5mL Q6H for better tolerance. Concern for refeeding syndrome due to weight loss and inadequate intake for several months. Already on thiamine . Discussed plan with MD and husband.   Medications reviewed and include: 100mg  thiamine   Labs reviewed:  Na 133 K+ 3.4 Creatinine 1.58 HA1C 5.1 Vitamin B12 2719 (High) Thiamine  pending   NUTRITION - FOCUSED PHYSICAL EXAM:  Flowsheet Row Most Recent Value  Orbital Region No depletion  Upper Arm Region No depletion  Thoracic and Lumbar Region  Unable to assess  Buccal Region No depletion  Temple Region No depletion  Clavicle Bone Region No depletion  Clavicle and Acromion Bone Region No depletion  Scapular Bone Region Unable to assess  Dorsal Hand No depletion  Patellar Region No depletion  Anterior Thigh Region No depletion  Posterior Calf Region No depletion  Edema (RD Assessment) None  Hair Reviewed  Eyes Unable to assess  Mouth Unable to assess  Skin  Reviewed  Nails Reviewed       Diet Order:   Diet Order             Diet NPO time specified  Diet effective midnight                   EDUCATION NEEDS:  Education needs have been addressed  Skin:  Skin Assessment: Reviewed RN Assessment  Last BM:  2/10 - type 7  Height:  Ht Readings from Last 1 Encounters:  10/15/23 5\' 1"  (1.549 m)   Weight:  Wt Readings from Last 1 Encounters:  10/22/23 87 kg   Ideal Body Weight:  47.73 kg  BMI:  Body mass index is 36.24 kg/m.  Estimated Nutritional Needs:  Kcal:  1950-2250 kcals Protein:  100-115 grams Fluid:  >/= 2.1L    Scheryl Cushing RD, LDN Contact via Secure Chat.

## 2023-10-22 NOTE — Progress Notes (Signed)
   10/22/23 0952  Assess: MEWS Score  Temp (!) 102.1 F (38.9 C)  BP (!) 104/58  MAP (mmHg) 73  Pulse Rate (!) 129  Level of Consciousness Responds to Voice  SpO2 96 %  Assess: MEWS Score  MEWS Temp 2  MEWS Systolic 0  MEWS Pulse 2  MEWS RR 0  MEWS LOC 1  MEWS Score 5  MEWS Score Color Red  Assess: SIRS CRITERIA  SIRS Temperature  1  SIRS Respirations  0  SIRS Pulse 1  SIRS WBC 0  SIRS Score Sum  2

## 2023-10-22 NOTE — Progress Notes (Addendum)
 Husband refused numerous medications for patient. Declined increase in rate of tube feeding as ordered. Attempted to educate husband to negative effect.

## 2023-10-22 NOTE — Progress Notes (Signed)
 Pharmacy Antibiotic Note  Misty Deleon is a 73 y.o. female admitted on 10/15/2023 with persistent nausea, vomiting and diarrhea..  Pharmacy has been consulted to dose cefepime  for fever of unknown source.  Plan: Cefepime  2gm IV q12h Follow renal function and clinical course  Height: 5\' 1"  (154.9 cm) Weight: 87 kg (191 lb 12.8 oz) IBW/kg (Calculated) : 47.8  Temp (24hrs), Avg:99.5 F (37.5 C), Min:97.7 F (36.5 C), Max:100.7 F (38.2 C)  Recent Labs  Lab 10/19/23 0237 10/19/23 0344 10/20/23 0438 10/21/23 0500 10/22/23 0500  WBC 4.6 4.9 6.6 5.5 4.7  CREATININE 1.23* 1.11* 1.47* 1.37* 1.58*  LATICACIDVEN 1.4 1.5  --   --   --     Estimated Creatinine Clearance: 32.7 mL/min (A) (by C-G formula based on SCr of 1.58 mg/dL (H)).    Allergies  Allergen Reactions   Lisinopril  Anaphylaxis, Swelling and Other (See Comments)    Entire tongue became swollen and the exterior front of the throat.    Amlodipine  Swelling and Other (See Comments)    Legs became swollen   Antimicrobials this admission:  Cefepime  2/7 >> Flagyl  2/7 >> (2/14)  Microbiology results:  2/8 GI panel: none detected 2.7 resp panel: none detected 2/7 Bcx: ngtd   Thank you for allowing pharmacy to be a part of this patient's care.  Kendall Pauls PharmD, BCPS [facility] main pharmacy [phone] 10/22/2023 8:39 AM

## 2023-10-22 NOTE — Progress Notes (Addendum)
 PROGRESS NOTE    Misty Deleon  WUJ:[id number] DOB: September 21, 1950 DOA: 10/15/2023 PCP: Thomasene Flemings, NP   Brief Narrative:  73 y.o. female with medical history significant for cervical cancer on maintenance bevacizumab , T2DM, CKD stage IIIa, HTN, HLD, hypothyroidism, anemia due to B12 deficiency, recent C. difficile infection who presented to the ED for evaluation of persistent nausea, vomiting, and diarrhea. Patient was previously admitted late October 2024 with acute pyelonephritis treated with cephalosporins. She was readmitted shortly after at the beginning of November for C. difficile diarrhea. She was treated with oral vancomycin . CT abdomen/pelvis with contrast showed mild fat stranding surrounding the bladder, mild rectal wall thickening worrisome for proctitis. Nonobstructing right renal calculus, sigmoid colon diverticulosis, stable presacral and perirectal edema.   Assessment & Plan: Principal Problem:   Nausea, vomiting, and diarrhea Active Problems:   Cervical cancer (HCC)   Chronic kidney disease, stage 3a (HCC)   Type 2 diabetes mellitus (HCC)   Vitamin B12 deficiency   Essential hypertension   Hypokalemia   Hypothyroidism   Failure to thrive in adult  Sepsis secondary to proctitis, not POA  -In the setting of recent Cdiff last month, abnormal PO intake for months, and loose stool due to tube feeds. No indication to retest for Cdiff - patient had formed stools at intake that became loose after tube feed initiation -notably formed today -Cultures preliminary negative -Cefepime /Flagyl  ongoing -likely 7 to 10-day course -CT abd/pelvis with contrast -generally unremarkable for acute process other than unspecified inflammation of the colon concerning for proctitis  Acute metabolic encephalopathy, multifactorial -Narcotics discontinued, continues to have low-grade fevers overnight, malnutrition improving with tube feeds.  Hospital delirium likely primary source of mental status  changes at this point -have offered Seroquel, low-dose, to family if she continues to have sleep cycle issues -holding until family is agreeable  AKI on CKD stage IIIa Hypokalemia -Creatinine continues to climb slowly despite IV fluids and increased p.o. intake -Urine output improving appropriately -Patient appears to be volume overloaded, hold any further IV fluids at this time -Continue to replete electrolytes as necessary  Nausea/vomiting/diarrhea/dysgeusia, improving Cannot rule out delayed gastric emptying/gastroparesis Constipation ruled out - Insidious onset over the last couple months, continue to advance tube feeds as tolerated (plan for 25cc/hr - increase by 5cc every 6 hours until at goal: around 50cc/hr) -Timing per husband coincides with bevacizumab  patient also noted to have elevated B12 which can cause worsening nausea vomiting diarrhea -Initial CT abdomen pelvis shows mild rectal thickening without overt obstruction -in the setting of previous cancer and radiation - repeat CT shows questionable proctitis -Discontinue MiraLAX  Senokot and metoclopramide  given prior episodes of diarrhea -Gastric emptying study remains pending, unclear if patient will be able to tolerate contrast amount/timeline -NG placed t 10/19/2023 for definitive p.o. access - Patient continues to tolerate NG tube feedings well. Continue 1.5 Glucerna at 25 cc/h, increase by 5 cc every 6 hours until goal of 50 cc/h reached.  Low threshold to pause and suction or decrease rate if patient becomes nauseous or has vomiting.  Allow ice chips today follow clinically. -Narcotics discontinued as below per family wishes  -GI panel negative   Oral thrush, POA  -Patient reported mouth pain at intake, exam consistent with thrush -Oral nystatin  ordered but family is refusing due to poor tolerance by patient -Diflucan  400 mg x 1 given 2/9  Low back pain, chronic: - Follows with [facility], MRI performed while in  hospital showing endplate compression at L4, moderate to  severe spinal canal narrowing at L3-4 and foraminal narrowing at L3-4 and L4-5 -IR attempting to get patient's procedure preapproved given insurance requires prior authorization.  Procedure on hold now given above clinical status and questionable infectious process  Cervical cancer: Follows with Dr. Corinne Dicker bevacizumab .  In remission. Type 2 diabetes:, Well-controlled -hold p.o. medications in the interim -restart at discharge  Hypertension: Blood pressure currently well-controlled off home medications. Hyperlipidemia: Continue rosuvastatin . Hypothyroidism: Continue Synthroid . Anemia/B12 deficiency: Continue supplementation  DVT prophylaxis: enoxaparin  (LOVENOX ) injection 40 mg Start: 10/15/23 2200 Code Status:   Code Status: Full Code Family Communication: Husband and daughter at bedside  Status is: Inpatient  Dispo: The patient is from: Home              Anticipated d/c is to: To be determined -likely SNF              Anticipated d/c date is: 72+ hours              Patient currently not medically stable for discharge  Consultants:  Oncology (IR/Ortho sidelined in regards to back pain/spinal narrowing)  Procedures:  None  Antimicrobials:  None  Subjective: No acute issues or events overnight, low-grade fever noted overnight with single episode of emesis per husband but otherwise generally improving, sleeping well, review of systems still limited by patient's mental status  Objective: Vitals:   10/21/23 2256 10/22/23 0238 10/22/23 0500 10/22/23 0651  BP: (!) 102/59 (!) 108/57  126/65  Pulse:  (!) 111  (!) 123  Resp: 16 20  16   Temp: 99.6 F (37.6 C) 97.7 F (36.5 C)  100.3 F (37.9 C)  TempSrc: Oral Oral  Oral  SpO2: 99% 99%  100%  Weight:   87 kg   Height:        Intake/Output Summary (Last 24 hours) at 10/22/2023 0740 Last data filed at 10/22/2023 0715 Gross per 24 hour  Intake 495.83 ml  Output 560 ml   Net -64.17 ml   Filed Weights   10/17/23 0500 10/21/23 0500 10/22/23 0500  Weight: 84.9 kg 85.9 kg 87 kg    Examination:  General: Awake and alert -unable to assess orientation. Resting comfortably in bed in no acute distress. HEENT: NG tube right nare Neck:  Without mass or deformity.  Trachea is midline. Lungs:  Clear to auscultate bilaterally without rhonchi, wheeze, or rales. Heart:  Regular rate and rhythm.  Without murmurs, rubs, or gallops. Abdomen:  Soft, nontender, nondistended. Extremities: Without cyanosis, clubbing, or obvious deformity.  Scant edema left> right upper extremity Skin:  Warm and dry, no erythema.  Data Reviewed: I have personally reviewed following labs and imaging studies  CBC: Recent Labs  Lab 10/15/23 1358 10/16/23 0818 10/19/23 0237 10/19/23 0344 10/20/23 0438 10/21/23 0500 10/22/23 0500  WBC 4.4   < > 4.6 4.9 6.6 5.5 4.7  NEUTROABS 2.3  --  2.3  --   --   --   --   HGB 10.6*   < > 9.1* 9.1* 8.9* 8.4* 8.4*  HCT 32.3*   < > 27.9* 28.3* 27.8* 26.0* 25.8*  MCV 100.3*   < > 101.5* 100.0 101.1* 98.9 98.5  PLT 179   < > 181 197 182 173 190   < > = values in this interval not displayed.   Basic Metabolic Panel: Recent Labs  Lab 10/15/23 1358 10/16/23 0229 10/19/23 0237 10/19/23 0344 10/20/23 0438 10/21/23 0500 10/22/23 0500  NA 134* 137 135 135 135 133*  133*  K 3.3* 3.6 3.6 3.7 3.3* 2.9* 3.4*  CL 102 102 104 104 105 103 106  CO2 20* 26 20* 19* 18* 20* 19*  GLUCOSE 80 95 97 92 88 77 80  BUN 12 12 16 15 15 14 14   CREATININE 1.06* 0.86 1.23* 1.11* 1.47* 1.37* 1.58*  CALCIUM  9.6 8.8* 9.2 9.3 8.9 8.5* 8.6*  MG 1.4* 2.0  --   --  1.1*  --   --   PHOS  --   --   --   --  3.3  --   --    GFR: Estimated Creatinine Clearance: 32.7 mL/min (A) (by C-G formula based on SCr of 1.58 mg/dL (H)). Liver Function Tests: Recent Labs  Lab 10/19/23 0237 10/19/23 0344 10/20/23 0438 10/21/23 0500 10/22/23 0500  AST 17 18 21 21 28   ALT 10 8 9 8  11   ALKPHOS 61 58 60 58 78  BILITOT 0.9 1.0 1.1 0.9 1.0  PROT 5.2* 5.4* 5.0* 4.7* 4.7*  ALBUMIN  2.5* 2.5* 2.3* 2.1* 2.0*   Recent Labs  Lab 10/15/23 1358  LIPASE 34    Recent Results (from the past 240 hours)  Resp panel by RT-PCR (RSV, Flu A&B, Covid) Anterior Nasal Swab     Status: None   Collection Time: 10/12/23  8:07 PM   Specimen: Anterior Nasal Swab  Result Value Ref Range Status   SARS Coronavirus 2 by RT PCR NEGATIVE NEGATIVE Final    Comment: (NOTE) SARS-CoV-2 target nucleic acids are NOT DETECTED.  The SARS-CoV-2 RNA is generally detectable in upper respiratory specimens during the acute phase of infection. The lowest concentration of SARS-CoV-2 viral copies this assay can detect is 138 copies/mL. A negative result does not preclude SARS-Cov-2 infection and should not be used as the sole basis for treatment or other patient management decisions. A negative result may occur with  improper specimen collection/handling, submission of specimen other than nasopharyngeal swab, presence of viral mutation(s) within the areas targeted by this assay, and inadequate number of viral copies(<138 copies/mL). A negative result must be combined with clinical observations, patient history, and epidemiological information. The expected result is Negative.  Fact Sheet for Patients:  BloggerCourse.com  Fact Sheet for Healthcare Providers:  SeriousBroker.it  This test is no t yet approved or cleared by the [place]  FDA and  has been authorized for detection and/or diagnosis of SARS-CoV-2 by FDA under an Emergency Use Authorization (EUA). This EUA will remain  in effect (meaning this test can be used) for the duration of the COVID-19 declaration under Section 564(b)(1) of the Act, 21 U.S.C.section 360bbb-3(b)(1), unless the authorization is terminated  or revoked sooner.       Influenza A by PCR NEGATIVE NEGATIVE Final    Influenza B by PCR NEGATIVE NEGATIVE Final    Comment: (NOTE) The Xpert Xpress SARS-CoV-2/FLU/RSV plus assay is intended as an aid in the diagnosis of influenza from Nasopharyngeal swab specimens and should not be used as a sole basis for treatment. Nasal washings and aspirates are unacceptable for Xpert Xpress SARS-CoV-2/FLU/RSV testing.  Fact Sheet for Patients: BloggerCourse.com  Fact Sheet for Healthcare Providers: SeriousBroker.it  This test is not yet approved or cleared by the [place]  FDA and has been authorized for detection and/or diagnosis of SARS-CoV-2 by FDA under an Emergency Use Authorization (EUA). This EUA will remain in effect (meaning this test can be used) for the duration of the COVID-19 declaration under Section 564(b)(1) of the Act, 21  U.S.C. section 360bbb-3(b)(1), unless the authorization is terminated or revoked.     Resp Syncytial Virus by PCR NEGATIVE NEGATIVE Final    Comment: (NOTE) Fact Sheet for Patients: BloggerCourse.com  Fact Sheet for Healthcare Providers: SeriousBroker.it  This test is not yet approved or cleared by the [place]  FDA and has been authorized for detection and/or diagnosis of SARS-CoV-2 by FDA under an Emergency Use Authorization (EUA). This EUA will remain in effect (meaning this test can be used) for the duration of the COVID-19 declaration under Section 564(b)(1) of the Act, 21 U.S.C. section 360bbb-3(b)(1), unless the authorization is terminated or revoked.  Performed at [facility], [address] [place], [place] [address]   Resp panel by RT-PCR (RSV, Flu A&B, Covid) Anterior Nasal Swab     Status: None   Collection Time: 10/15/23 10:18 PM   Specimen: Anterior Nasal Swab  Result Value Ref Range Status   SARS Coronavirus 2 by RT PCR NEGATIVE NEGATIVE Final    Comment: (NOTE) SARS-CoV-2  target nucleic acids are NOT DETECTED.  The SARS-CoV-2 RNA is generally detectable in upper respiratory specimens during the acute phase of infection. The lowest concentration of SARS-CoV-2 viral copies this assay can detect is 138 copies/mL. A negative result does not preclude SARS-Cov-2 infection and should not be used as the sole basis for treatment or other patient management decisions. A negative result may occur with  improper specimen collection/handling, submission of specimen other than nasopharyngeal swab, presence of viral mutation(s) within the areas targeted by this assay, and inadequate number of viral copies(<138 copies/mL). A negative result must be combined with clinical observations, patient history, and epidemiological information. The expected result is Negative.  Fact Sheet for Patients:  BloggerCourse.com  Fact Sheet for Healthcare Providers:  SeriousBroker.it  This test is no t yet approved or cleared by the [place]  FDA and  has been authorized for detection and/or diagnosis of SARS-CoV-2 by FDA under an Emergency Use Authorization (EUA). This EUA will remain  in effect (meaning this test can be used) for the duration of the COVID-19 declaration under Section 564(b)(1) of the Act, 21 U.S.C.section 360bbb-3(b)(1), unless the authorization is terminated  or revoked sooner.       Influenza A by PCR NEGATIVE NEGATIVE Final   Influenza B by PCR NEGATIVE NEGATIVE Final    Comment: (NOTE) The Xpert Xpress SARS-CoV-2/FLU/RSV plus assay is intended as an aid in the diagnosis of influenza from Nasopharyngeal swab specimens and should not be used as a sole basis for treatment. Nasal washings and aspirates are unacceptable for Xpert Xpress SARS-CoV-2/FLU/RSV testing.  Fact Sheet for Patients: BloggerCourse.com  Fact Sheet for Healthcare  Providers: SeriousBroker.it  This test is not yet approved or cleared by the [place]  FDA and has been authorized for detection and/or diagnosis of SARS-CoV-2 by FDA under an Emergency Use Authorization (EUA). This EUA will remain in effect (meaning this test can be used) for the duration of the COVID-19 declaration under Section 564(b)(1) of the Act, 21 U.S.C. section 360bbb-3(b)(1), unless the authorization is terminated or revoked.     Resp Syncytial Virus by PCR NEGATIVE NEGATIVE Final    Comment: (NOTE) Fact Sheet for Patients: BloggerCourse.com  Fact Sheet for Healthcare Providers: SeriousBroker.it  This test is not yet approved or cleared by the [place]  FDA and has been authorized for detection and/or diagnosis of SARS-CoV-2 by FDA under an Emergency Use Authorization (EUA). This EUA will remain in effect (meaning this test can  be used) for the duration of the COVID-19 declaration under Section 564(b)(1) of the Act, 21 U.S.C. section 360bbb-3(b)(1), unless the authorization is terminated or revoked.  Performed at [facility] [address], [place], [place] [address]   SARS Coronavirus 2 by RT PCR (hospital order, performed in [facility] hospital lab) *cepheid single result test* Urine, Clean Catch     Status: None   Collection Time: 10/19/23  3:05 AM   Specimen: Urine, Clean Catch; Nasal Swab  Result Value Ref Range Status   SARS Coronavirus 2 by RT PCR NEGATIVE NEGATIVE Final    Comment: (NOTE) SARS-CoV-2 target nucleic acids are NOT DETECTED.  The SARS-CoV-2 RNA is generally detectable in upper and lower respiratory specimens during the acute phase of infection. The lowest concentration of SARS-CoV-2 viral copies this assay can detect is 250 copies / mL. A negative result does not preclude SARS-CoV-2 infection and should not be used as the sole basis for  treatment or other patient management decisions.  A negative result may occur with improper specimen collection / handling, submission of specimen other than nasopharyngeal swab, presence of viral mutation(s) within the areas targeted by this assay, and inadequate number of viral copies (<250 copies / mL). A negative result must be combined with clinical observations, patient history, and epidemiological information.  Fact Sheet for Patients:   RoadLapTop.co.za  Fact Sheet for Healthcare Providers: http://kim-miller.com/  This test is not yet approved or  cleared by the [place]  FDA and has been authorized for detection and/or diagnosis of SARS-CoV-2 by FDA under an Emergency Use Authorization (EUA).  This EUA will remain in effect (meaning this test can be used) for the duration of the COVID-19 declaration under Section 564(b)(1) of the Act, 21 U.S.C. section 360bbb-3(b)(1), unless the authorization is terminated or revoked sooner.  Performed at [facility], [address], [place], [place] [address]   Respiratory (~20 pathogens) panel by PCR     Status: None   Collection Time: 10/19/23  3:05 AM   Specimen: Nasopharyngeal Swab; Respiratory  Result Value Ref Range Status   Adenovirus NOT DETECTED NOT DETECTED Final   Coronavirus 229E NOT DETECTED NOT DETECTED Final    Comment: (NOTE) The Coronavirus on the Respiratory Panel, DOES NOT test for the novel  Coronavirus (2019 nCoV)    Coronavirus HKU1 NOT DETECTED NOT DETECTED Final   Coronavirus NL63 NOT DETECTED NOT DETECTED Final   Coronavirus OC43 NOT DETECTED NOT DETECTED Final   Metapneumovirus NOT DETECTED NOT DETECTED Final   Rhinovirus / Enterovirus NOT DETECTED NOT DETECTED Final   Influenza A NOT DETECTED NOT DETECTED Final   Influenza B NOT DETECTED NOT DETECTED Final   Parainfluenza Virus 1 NOT DETECTED NOT DETECTED Final   Parainfluenza Virus 2  NOT DETECTED NOT DETECTED Final   Parainfluenza Virus 3 NOT DETECTED NOT DETECTED Final   Parainfluenza Virus 4 NOT DETECTED NOT DETECTED Final   Respiratory Syncytial Virus NOT DETECTED NOT DETECTED Final   Bordetella pertussis NOT DETECTED NOT DETECTED Final   Bordetella Parapertussis NOT DETECTED NOT DETECTED Final   Chlamydophila pneumoniae NOT DETECTED NOT DETECTED Final   Mycoplasma pneumoniae NOT DETECTED NOT DETECTED Final    Comment: Performed at [facility], [address], [place], [place] [address]  Culture, blood (x 2)     Status: None (Preliminary result)   Collection Time: 10/19/23  3:44 AM   Specimen: BLOOD  Result Value Ref Range Status  Specimen Description   Final    BLOOD RIGHT ANTECUBITAL Performed at [facility], [address], [place], [place] [address]    Special Requests   Final    BOTTLES DRAWN AEROBIC AND ANAEROBIC Blood Culture results may not be optimal due to an inadequate volume of blood received in culture bottles Performed at [facility], [address] [place], [place] [address]    Culture   Final    NO GROWTH 2 DAYS Performed at [facility], [address], [place], [place] [address]    Report Status PENDING  Incomplete  Culture, blood (x 2)     Status: None (Preliminary result)   Collection Time: 10/19/23  3:44 AM   Specimen: BLOOD RIGHT HAND  Result Value Ref Range Status   Specimen Description   Final    BLOOD RIGHT HAND Performed at [facility], [address] [place], [place] [address]    Special Requests   Final    BOTTLES DRAWN AEROBIC AND ANAEROBIC Blood Culture adequate volume Performed at [facility], [address], [place], [place] [address]    Culture   Final    NO GROWTH 2 DAYS Performed at [facility], [address], [place], [place] [address]    Report Status PENDING  Incomplete  Gastrointestinal Panel by PCR , Stool      Status: None   Collection Time: 10/20/23  2:35 AM   Specimen: Stool  Result Value Ref Range Status   Campylobacter species NOT DETECTED NOT DETECTED Final   Plesimonas shigelloides NOT DETECTED NOT DETECTED Final   Salmonella species NOT DETECTED NOT DETECTED Final   Yersinia enterocolitica NOT DETECTED NOT DETECTED Final   Vibrio species NOT DETECTED NOT DETECTED Final   Vibrio cholerae NOT DETECTED NOT DETECTED Final   Enteroaggregative E coli (EAEC) NOT DETECTED NOT DETECTED Final   Enteropathogenic E coli (EPEC) NOT DETECTED NOT DETECTED Final   Enterotoxigenic E coli (ETEC) NOT DETECTED NOT DETECTED Final   Shiga like toxin producing E coli (STEC) NOT DETECTED NOT DETECTED Final   Shigella/Enteroinvasive E coli (EIEC) NOT DETECTED NOT DETECTED Final   Cryptosporidium NOT DETECTED NOT DETECTED Final   Cyclospora cayetanensis NOT DETECTED NOT DETECTED Final   Entamoeba histolytica NOT DETECTED NOT DETECTED Final   Giardia lamblia NOT DETECTED NOT DETECTED Final   Adenovirus F40/41 NOT DETECTED NOT DETECTED Final   Astrovirus NOT DETECTED NOT DETECTED Final   Norovirus GI/GII NOT DETECTED NOT DETECTED Final   Rotavirus A NOT DETECTED NOT DETECTED Final   Sapovirus (I, II, IV, and V) NOT DETECTED NOT DETECTED Final    Comment: Performed at [facility], [address], [place], [place] [address]      Radiology Studies: CT ABDOMEN PELVIS W CONTRAST Result Date: 10/20/2023 CLINICAL DATA:  Abdominal pain.  Constipation. EXAM: CT ABDOMEN AND PELVIS WITH CONTRAST TECHNIQUE: Multidetector CT imaging of the abdomen and pelvis was performed using the standard protocol following bolus administration of intravenous contrast. RADIATION DOSE REDUCTION: This exam was performed according to the departmental dose-optimization program which includes automated exposure control, adjustment of the mA and/or kV according to patient size and/or use of iterative reconstruction technique. CONTRAST:   OMNIPAQUE  IOHEXOL  300 MG/ML  SOLN COMPARISON:  10/15/2023 FINDINGS: Lower Chest: No acute findings. Hepatobiliary: No suspicious hepatic masses identified. Gallbladder is unremarkable. No evidence of biliary ductal dilatation. Pancreas:  No mass or inflammatory  changes. Spleen: Within normal limits in size and appearance. Adrenals/Urinary Tract: No suspicious masses identified. Right renal calculi are again seen, largest measuring 8 mm. No evidence of ureteral calculi or hydronephrosis. Unremarkable unopacified urinary bladder. Stomach/Bowel: Enteric tube is seen with tip in the distal gastric antrum. Mild rectal wall thickening and perirectal/presacral soft tissue stranding are seen, suspicious for proctitis. No evidence of perirectal fistula or abscess. Diverticulosis is seen mainly involving the sigmoid colon, however there is no evidence of diverticulitis. No significant stool burden seen. Vascular/Lymphatic: No pathologically enlarged lymph nodes. No acute vascular findings. Reproductive:  No mass or other significant abnormality. Other:  Mild diffuse body wall edema noted. Musculoskeletal:  No suspicious bone lesions identified. IMPRESSION: Mild rectal wall thickening and perirectal/presacral soft tissue stranding, suspicious for proctitis. No evidence of perirectal fistula or abscess. Colonic diverticulosis, without radiographic evidence of diverticulitis. No significant stool burden. Right nephrolithiasis. No evidence of ureteral calculi or hydronephrosis. Mild diffuse body wall edema. Electronically Signed   By: Marlyce Sine M.D.   On: 10/20/2023 17:38   Scheduled Meds:  Chlorhexidine  Gluconate Cloth  6 each Topical Daily   enoxaparin  (LOVENOX ) injection  40 mg Subcutaneous Q24H   feeding supplement  1 Container Oral TID   levothyroxine   50 mcg Per Tube QAC breakfast   metoCLOPramide  (REGLAN ) injection  10 mg Intravenous Q8H   nystatin   5 mL Oral QID   polyethylene glycol  17 g Per Tube BID    potassium chloride   40 mEq Per Tube TID   rosuvastatin   40 mg Per Tube QHS   saccharomyces boulardii  250 mg Per Tube BID   senna-docusate  2 tablet Per Tube BID   thiamine   100 mg Per Tube Daily   Continuous Infusions:  ceFEPime  (MAXIPIME ) IV 2 g (10/22/23 0250)   feeding supplement (GLUCERNA 1.5 CAL) 25 mL/hr at 10/22/23 0715   metronidazole  500 mg (10/22/23 0251)     LOS: 4 days   Time spent:  Haydee Lipa, DO [facility]  If 7PM-7AM, please contact night-coverage [email or web address]  10/22/2023, 7:40 AM

## 2023-10-22 NOTE — Progress Notes (Addendum)
 Physical Therapy Treatment Patient Details Name: Misty Deleon MRN: [id number] DOB: 07-Mar-1951 Today's Date: 10/22/2023   History of Present Illness Patient is a 73 year old female who presented on 2/3 with nausea. Patient was admitted with hypokalemia, low back pain,and dysgeusia. PMH:L5 compression fracture per ortho visit on 12/3 prior to ED visit, cervical cancer,CKD, anemia, HTN, HLD, b12 deficiency, DM II.    PT Comments  SIGNIFICANT MOBILITY DECLINE Pt in bed AxO x 1 lethargic/groggy/sleepy.  Spouse at bed side.  Assisted to EOB was difficult.  General bed mobility comments: attempted to sit pt EOB was VERY difficult.  Pt required Total Asisst to transfer from supine to EOB pt <5%.  Once EOB, pt was able to static sit x 5 min but poor flexed collapsded posture. Assisted back to bed required Total Assist + 2.  Scoot to [facility]  and positioned to comfort.  General transfer comment: unbale to sttempt dur to poor sitting posture/tolerance.  Rec Maxi Move LIFT.    If plan is discharge home, recommend the following: A little help with walking and/or transfers;Assist for transportation;Assistance with cooking/housework;Help with stairs or ramp for entrance   Can travel by private vehicle        Equipment Recommendations  None recommended by PT    Recommendations for Other Services       Precautions / Restrictions Precautions Precautions: Fall Precaution Comments: back precautions- L5 compression, SIRS, NG Tube Restrictions Weight Bearing Restrictions Per Provider Order: No     Mobility  Bed Mobility Overal bed mobility: Needs Assistance Bed Mobility: Rolling, Supine to Sit, Sit to Supine Rolling: Total assist, +2 for physical assistance, +2 for safety/equipment   Supine to sit: +2 for physical assistance, +2 for safety/equipment, HOB elevated Sit to supine: Total assist, +2 for physical assistance, +2 for safety/equipment   General bed mobility comments: attempted to sit pt  EOB was VERY difficult.  Pt required Total Asisst to transfer from supine to EOB pt <5%.  Once EOB, pt was able to static sit x 5 min but poor flexed collapsded posture. Assisted back to bed required Total Assist + 2.  Scoot to hob and positioned to comfort.    Transfers                   General transfer comment: unbale to sttempt dur to poor sitting posture/tolerance.  Rec Maxi Move LIFT.    Ambulation/Gait                   Stairs             Wheelchair Mobility     Tilt Bed    Modified Rankin (Stroke Patients Only)       Balance                                            Cognition Arousal: Lethargic Behavior During Therapy: Flat affect                                   General Comments: Pt 85% lethargic with brief eye contact and a few mumble words.  Spouse at bedside.  Very supportive/attentive.        Exercises      General Comments        Pertinent  Vitals/Pain Pain Assessment Pain Assessment: No/denies pain    Home Living                          Prior Function            PT Goals (current goals can now be found in the care plan section) Progress towards PT goals: Progressing toward goals    Frequency    Min 1X/week      PT Plan      Co-evaluation              AM-PAC PT "6 Clicks" Mobility   Outcome Measure  Help needed turning from your back to your side while in a flat bed without using bedrails?: Total Help needed moving from lying on your back to sitting on the side of a flat bed without using bedrails?: Total Help needed moving to and from a bed to a chair (including a wheelchair)?: Total Help needed standing up from a chair using your arms (e.g., wheelchair or bedside chair)?: Total Help needed to walk in hospital room?: Total Help needed climbing 3-5 steps with a railing? : Total 6 Click Score: 6    End of Session Equipment Utilized During Treatment:  Gait belt Activity Tolerance: Patient limited by lethargy Patient left: in bed;with call bell/phone within reach;with family/visitor present Nurse Communication: Mobility status PT Visit Diagnosis: Unsteadiness on feet (R26.81);Muscle weakness (generalized) (M62.81);Pain     Time: 1610-9604 PT Time Calculation (min) (ACUTE ONLY): 24 min  Charges:    Bess Broody  PTA Acute  Rehabilitation Services Office M-F          [phone]

## 2023-10-22 NOTE — Progress Notes (Signed)
   10/22/23 1750  Assess: MEWS Score  Temp (!) 97.5 F (36.4 C)  BP (!) 112/54  MAP (mmHg) 72  Pulse Rate (!) 124  SpO2 94 %  Assess: MEWS Score  MEWS Temp 0  MEWS Systolic 0  MEWS Pulse 2  MEWS RR 0  MEWS LOC 1  MEWS Score 3  MEWS Score Color Yellow  Provider Notification  Provider Name/Title [place] md  Date Provider Notified 10/22/23  Method of Notification  (secure chat)  Notification Reason Other (Comment) (request for  tube change)  Provider response Other (Comment) (awaiting orders)  Assess: SIRS CRITERIA  SIRS Temperature  0  SIRS Respirations  0  SIRS Pulse 1  SIRS WBC 0  SIRS Score Sum  1

## 2023-10-22 NOTE — Progress Notes (Signed)
   10/22/23 0238  Assess: MEWS Score  Temp 97.7 F (36.5 C)  BP (!) 108/57  MAP (mmHg) 74  Pulse Rate (!) 111  Resp 20  Level of Consciousness Alert  SpO2 99 %  Assess: MEWS Score  MEWS Temp 0  MEWS Systolic 0  MEWS Pulse 2  MEWS RR 0  MEWS LOC 0  MEWS Score 2  MEWS Score Color Yellow  Assess: if the MEWS score is Yellow or Red  Were vital signs accurate and taken at a resting state? Yes  Does the patient meet 2 or more of the SIRS criteria? No  Does the patient have a confirmed or suspected source of infection? No  MEWS guidelines implemented  Yes, yellow  Treat  MEWS Interventions Considered administering scheduled or prn medications/treatments as ordered  Take Vital Signs  Increase Vital Sign Frequency  Yellow: Q2hr x1, continue Q4hrs until patient remains green for 12hrs  Escalate  MEWS: Escalate Yellow: Discuss with charge nurse and consider notifying provider and/or RRT  Notify: Charge Nurse/RN  Name of Charge Nurse/RN Notified [place], RN  Provider Notification  Provider Name/Title Denece Finger, NP  Date Provider Notified 10/22/23  Time Provider Notified [phone]  Method of Notification Page  Notification Reason Other (Comment) (yellow MEWS)  Provider response No new orders  Date of Provider Response 10/22/23  Time of Provider Response 0242  Assess: SIRS CRITERIA  SIRS Temperature  0  SIRS Respirations  0  SIRS Pulse 1  SIRS WBC 0  SIRS Score Sum  1

## 2023-10-22 NOTE — Progress Notes (Addendum)
 0730-   checked notes  and imaging to make sure imaging was completed for placement   and clarified with md to stay with trickle feed md confirmed feed to remain and parameters  bed remains in sitting position   0956- pt temp elevated in axillary  by report from tech,  medicated per order   pt family notes  pt has been having issues eating since October of 2024  anf has gradually worsened  made md aware    Md to come to bedside per family request   1148-  temp has decreased , pt family remains at bedside   pt remains tachy  md aware  no new orders  1211-  pt tolerating tube feeding well, pt remains calm but is still having loose bowels  family concerned about c diff, md made aware    1530- tube feed to be changed to osmolite 1.5 starting at 25 ml/hr and increased by 5 ml every 6 hrs if tolerated . Tube feed started husband remains at bedside bed remains upright and safety measures in place   1655- pt tolerating tube feed thus far, md made aware   family remains at bedside  Zoe Hinds Rn changed dressing on port  Vital signs stable  no distress noted   1940-  pt calm resting in bed   family has remained at bedside through shift   no acute distress noted   family refusing at this time to have feeds increased through evening, family states they will wait until the morning to decide how to proceed. Richie Chapel Rn at bedside with this nurse all safety measures remain in place  tube feed transfer of care completed , handoff report completed at bedside with Serena Dana

## 2023-10-22 NOTE — Progress Notes (Signed)
 Upon this RN participating in bedside rounding, plan to increase feeds starting this pm by 90ml/hr was discussed w/ family. Spouse informed this RN of a conversation had w/ provider. This RN informed family of how the tube feeds are ordered. Family deferred increasing feeds until am shift. This RN offered increasing feeds on pm shift around 0600, spouse deferred that decision to daughter when she comes later.

## 2023-10-23 ENCOUNTER — Other Ambulatory Visit: Payer: Self-pay | Admitting: Hematology and Oncology

## 2023-10-23 ENCOUNTER — Inpatient Hospital Stay (HOSPITAL_COMMUNITY): Payer: Medicare PPO

## 2023-10-23 ENCOUNTER — Other Ambulatory Visit: Payer: Self-pay

## 2023-10-23 DIAGNOSIS — E039 Hypothyroidism, unspecified: Secondary | ICD-10-CM

## 2023-10-23 DIAGNOSIS — R6521 Severe sepsis with septic shock: Secondary | ICD-10-CM

## 2023-10-23 DIAGNOSIS — R197 Diarrhea, unspecified: Secondary | ICD-10-CM | POA: Diagnosis not present

## 2023-10-23 DIAGNOSIS — R609 Edema, unspecified: Secondary | ICD-10-CM

## 2023-10-23 DIAGNOSIS — N1832 Chronic kidney disease, stage 3b: Secondary | ICD-10-CM

## 2023-10-23 DIAGNOSIS — Z66 Do not resuscitate: Secondary | ICD-10-CM

## 2023-10-23 DIAGNOSIS — A419 Sepsis, unspecified organism: Secondary | ICD-10-CM | POA: Diagnosis not present

## 2023-10-23 DIAGNOSIS — I10 Essential (primary) hypertension: Secondary | ICD-10-CM

## 2023-10-23 DIAGNOSIS — R112 Nausea with vomiting, unspecified: Secondary | ICD-10-CM | POA: Diagnosis not present

## 2023-10-23 DIAGNOSIS — E440 Moderate protein-calorie malnutrition: Secondary | ICD-10-CM | POA: Insufficient documentation

## 2023-10-23 DIAGNOSIS — E871 Hypo-osmolality and hyponatremia: Secondary | ICD-10-CM

## 2023-10-23 DIAGNOSIS — N179 Acute kidney failure, unspecified: Secondary | ICD-10-CM

## 2023-10-23 DIAGNOSIS — Z7189 Other specified counseling: Secondary | ICD-10-CM

## 2023-10-23 DIAGNOSIS — E119 Type 2 diabetes mellitus without complications: Secondary | ICD-10-CM

## 2023-10-23 DIAGNOSIS — E876 Hypokalemia: Secondary | ICD-10-CM

## 2023-10-23 LAB — COMPREHENSIVE METABOLIC PANEL
ALT: 11 U/L (ref 0–44)
AST: 40 U/L (ref 15–41)
Albumin: 1.8 g/dL — ABNORMAL LOW (ref 3.5–5.0)
Alkaline Phosphatase: 101 U/L (ref 38–126)
Anion gap: 11 (ref 5–15)
BUN: 18 mg/dL (ref 8–23)
CO2: 16 mmol/L — ABNORMAL LOW (ref 22–32)
Calcium: 8.5 mg/dL — ABNORMAL LOW (ref 8.9–10.3)
Chloride: 107 mmol/L (ref 98–111)
Creatinine, Ser: 2.39 mg/dL — ABNORMAL HIGH (ref 0.44–1.00)
GFR, Estimated: 21 mL/min — ABNORMAL LOW (ref >60)
Glucose, Bld: 140 mg/dL — ABNORMAL HIGH (ref 70–99)
Potassium: 3.2 mmol/L — ABNORMAL LOW (ref 3.5–5.1)
Sodium: 134 mmol/L — ABNORMAL LOW (ref 135–145)
Total Bilirubin: 1.3 mg/dL — ABNORMAL HIGH (ref 0.0–1.2)
Total Protein: 4.4 g/dL — ABNORMAL LOW (ref 6.5–8.1)

## 2023-10-23 LAB — CBC
HCT: 25.2 % — ABNORMAL LOW (ref 36.0–46.0)
Hemoglobin: 8 g/dL — ABNORMAL LOW (ref 12.0–15.0)
MCH: 32 pg (ref 26.0–34.0)
MCHC: 31.7 g/dL (ref 30.0–36.0)
MCV: 100.8 fL — ABNORMAL HIGH (ref 80.0–100.0)
Platelets: 165 10*3/uL (ref 150–400)
RBC: 2.5 MIL/uL — ABNORMAL LOW (ref 3.87–5.11)
RDW: 16.4 % — ABNORMAL HIGH (ref 11.5–15.5)
WBC: 5.5 10*3/uL (ref 4.0–10.5)
nRBC: 0 % (ref 0.0–0.2)

## 2023-10-23 LAB — TROPONIN I (HIGH SENSITIVITY)
Troponin I (High Sensitivity): 32 ng/L — ABNORMAL HIGH (ref <18)
Troponin I (High Sensitivity): 34 ng/L — ABNORMAL HIGH (ref <18)

## 2023-10-23 LAB — URINALYSIS, ROUTINE W REFLEX MICROSCOPIC
Bilirubin Urine: NEGATIVE
Glucose, UA: 50 mg/dL — AB
Ketones, ur: 5 mg/dL — AB
Nitrite: NEGATIVE
Protein, ur: 100 mg/dL — AB
Specific Gravity, Urine: 1.031 — ABNORMAL HIGH (ref 1.005–1.030)
pH: 5 (ref 5.0–8.0)

## 2023-10-23 LAB — LACTIC ACID, PLASMA
Lactic Acid, Venous: 4.1 mmol/L (ref 0.5–1.9)
Lactic Acid, Venous: 3.3 mmol/L (ref 0.5–1.9)

## 2023-10-23 LAB — T4, FREE: Free T4: 1.11 ng/dL (ref 0.61–1.12)

## 2023-10-23 LAB — GLUCOSE, CAPILLARY
Glucose-Capillary: 174 mg/dL — ABNORMAL HIGH (ref 70–99)
Glucose-Capillary: 190 mg/dL — ABNORMAL HIGH (ref 70–99)

## 2023-10-23 LAB — CORTISOL: Cortisol, Plasma: 1.8 ug/dL

## 2023-10-23 LAB — AMMONIA: Ammonia: 29 umol/L (ref 9–35)

## 2023-10-23 LAB — MAGNESIUM: Magnesium: 1.5 mg/dL — ABNORMAL LOW (ref 1.7–2.4)

## 2023-10-23 LAB — PHOSPHORUS: Phosphorus: 1.4 mg/dL — ABNORMAL LOW (ref 2.5–4.6)

## 2023-10-23 MED ORDER — POTASSIUM PHOSPHATES 15 MMOLE/5ML IV SOLN
45.0000 mmol | Freq: Once | INTRAVENOUS | Status: AC
  Administered 2023-10-23: 45 mmol via INTRAVENOUS
  Filled 2023-10-23: qty 15

## 2023-10-23 MED ORDER — MAGIC MOUTHWASH: 5.0000 mL | Freq: Three times a day (TID) | ORAL | Status: DC | PRN

## 2023-10-23 MED ORDER — MAGNESIUM SULFATE 2 GM/50ML IV SOLN
2.0000 g | Freq: Once | INTRAVENOUS | Status: AC
  Administered 2023-10-23: 2 g via INTRAVENOUS
  Filled 2023-10-23: qty 50

## 2023-10-23 MED ORDER — LACTATED RINGERS IV BOLUS
500.0000 mL | Freq: Once | INTRAVENOUS | Status: AC
  Administered 2023-10-23: 500 mL via INTRAVENOUS

## 2023-10-23 MED ORDER — IPRATROPIUM-ALBUTEROL 0.5-2.5 (3) MG/3ML IN SOLN: 3.0000 mL | Freq: Four times a day (QID) | RESPIRATORY_TRACT | Status: DC

## 2023-10-23 MED ORDER — HYDROCORTISONE SOD SUC (PF) 100 MG IJ SOLR
100.0000 mg | Freq: Two times a day (BID) | INTRAMUSCULAR | Status: DC
  Administered 2023-10-23 (×2): 100 mg via INTRAVENOUS
  Filled 2023-10-23 (×2): qty 2

## 2023-10-23 MED ORDER — K PHOS MONO-SOD PHOS DI & MONO 155-852-130 MG PO TABS
250.0000 mg | ORAL_TABLET | Freq: Once | ORAL | Status: AC
  Administered 2023-10-23: 250 mg
  Filled 2023-10-23: qty 1

## 2023-10-23 MED ORDER — VANCOMYCIN HCL IN DEXTROSE 1-5 GM/200ML-% IV SOLN
1000.0000 mg | Freq: Once | INTRAVENOUS | Status: AC
  Administered 2023-10-23: 1000 mg via INTRAVENOUS
  Filled 2023-10-23: qty 200

## 2023-10-23 MED ORDER — SODIUM CHLORIDE 0.9 % IV SOLN
250.0000 mL | INTRAVENOUS | Status: AC
Start: 2023-10-23 — End: 2023-10-24
  Administered 2023-10-23: 250 mL via INTRAVENOUS

## 2023-10-23 MED ORDER — MAGNESIUM SULFATE 2 GM/50ML IV SOLN: 2.0000 g | Freq: Once | INTRAVENOUS | Status: DC

## 2023-10-23 MED ORDER — INSULIN ASPART 100 UNIT/ML IJ SOLN
1.0000 [IU] | INTRAMUSCULAR | Status: DC
  Administered 2023-10-23 (×2): 2 [IU] via SUBCUTANEOUS

## 2023-10-23 MED ORDER — POTASSIUM CHLORIDE 10 MEQ/100ML IV SOLN: 10.0000 meq | INTRAVENOUS | Status: DC

## 2023-10-23 MED ORDER — NYSTATIN 100000 UNIT/ML MT SUSP: 5.0000 mL | Freq: Four times a day (QID) | OROMUCOSAL | Status: DC

## 2023-10-23 MED ORDER — NOREPINEPHRINE 4 MG/250ML-% IV SOLN
2.0000 ug/min | INTRAVENOUS | Status: DC
  Administered 2023-10-23: 2 ug/min via INTRAVENOUS
  Filled 2023-10-23: qty 250

## 2023-10-23 MED ORDER — ALBUMIN HUMAN 5 % IV SOLN
25.0000 g | Freq: Once | INTRAVENOUS | Status: AC
  Administered 2023-10-23: 25 g via INTRAVENOUS
  Filled 2023-10-23: qty 500

## 2023-10-23 MED ORDER — POTASSIUM CHLORIDE 20 MEQ PO PACK
80.0000 meq | PACK | Freq: Once | ORAL | Status: AC
  Administered 2023-10-23: 80 meq
  Filled 2023-10-23: qty 4

## 2023-10-23 MED ORDER — LACTATED RINGERS IV SOLN: INTRAVENOUS | Status: AC

## 2023-10-23 MED ORDER — IPRATROPIUM-ALBUTEROL 0.5-2.5 (3) MG/3ML IN SOLN: 3.0000 mL | RESPIRATORY_TRACT | Status: DC | PRN

## 2023-10-23 MED ORDER — NOREPINEPHRINE 4 MG/250ML-% IV SOLN
0.0000 ug/min | INTRAVENOUS | Status: DC
  Administered 2023-10-23: 18 ug/min via INTRAVENOUS
  Administered 2023-10-23: 17 ug/min via INTRAVENOUS
  Administered 2023-10-23: 11 ug/min via INTRAVENOUS
  Administered 2023-10-23: 18 ug/min via INTRAVENOUS
  Administered 2023-10-23 – 2023-10-24 (×2): 16 ug/min via INTRAVENOUS
  Administered 2023-10-24: 18 ug/min via INTRAVENOUS
  Administered 2023-10-24: 16 ug/min via INTRAVENOUS
  Filled 2023-10-23 (×7): qty 250

## 2023-10-23 MED ORDER — ENOXAPARIN SODIUM 30 MG/0.3ML IJ SOSY: 30.0000 mg | PREFILLED_SYRINGE | INTRAMUSCULAR | Status: DC

## 2023-10-23 MED ORDER — SODIUM CHLORIDE 0.9 % IV SOLN
2.0000 g | INTRAVENOUS | Status: DC
  Administered 2023-10-24: 2 g via INTRAVENOUS
  Filled 2023-10-23: qty 12.5

## 2023-10-23 MED ORDER — FAMOTIDINE 20 MG PO TABS: 10.0000 mg | ORAL_TABLET | Freq: Every day | ORAL | Status: DC

## 2023-10-23 MED ORDER — NYSTATIN 100000 UNIT/ML MT SUSP
5.0000 mL | Freq: Four times a day (QID) | OROMUCOSAL | Status: DC
  Administered 2023-10-23: 500000 [IU] via ORAL
  Filled 2023-10-23: qty 5

## 2023-10-23 MED ORDER — HALOPERIDOL LACTATE 5 MG/ML IJ SOLN
5.0000 mg | Freq: Four times a day (QID) | INTRAMUSCULAR | Status: DC | PRN
  Administered 2023-10-23: 5 mg via INTRAMUSCULAR
  Filled 2023-10-23: qty 1

## 2023-10-23 MED ORDER — LEVOTHYROXINE SODIUM 100 MCG/5ML IV SOLN: 37.5000 ug | Freq: Every day | INTRAVENOUS | Status: DC

## 2023-10-23 MED ORDER — ORAL CARE MOUTH RINSE: 15.0000 mL | OROMUCOSAL | Status: DC | PRN

## 2023-10-23 MED ORDER — LEVOTHYROXINE SODIUM 100 MCG/5ML IV SOLN: 50.0000 ug | Freq: Every day | INTRAVENOUS | Status: DC

## 2023-10-23 MED ORDER — POTASSIUM CHLORIDE 10 MEQ/100ML IV SOLN
10.0000 meq | INTRAVENOUS | Status: AC
  Administered 2023-10-23 (×3): 10 meq via INTRAVENOUS
  Filled 2023-10-23 (×3): qty 100

## 2023-10-23 MED ORDER — ORAL CARE MOUTH RINSE
15.0000 mL | OROMUCOSAL | Status: DC
  Administered 2023-10-23 – 2023-10-24 (×7): 15 mL via OROMUCOSAL

## 2023-10-23 NOTE — Progress Notes (Signed)
Pharmacy Antibiotic Note  Misty Deleon is a 73 y.o. female admitted on 10/15/2023 with persistent nausea, vomiting and diarrhea..  Pharmacy has been consulted to dose cefepime for fever of unknown source.  Scr increased to 2.39 this morning; 24h I/O +2.4L  Plan: Decrease Cefepime 2gm IV q24h Also decreased Lovenox 30mg  sq q4h Follow renal function and clinical course  Height: 5\' 1"  (154.9 cm) Weight: 86.2 kg (190 lb 0.6 oz) IBW/kg (Calculated) : 47.8  Temp (24hrs), Avg:99.5 F (37.5 C), Min:97.5 F (36.4 C), Max:103.1 F (39.5 C)  Recent Labs  Lab 10/19/23 0237 10/19/23 0344 10/20/23 0438 10/21/23 0500 10/22/23 0500 10/23/23 0228  WBC 4.6 4.9 6.6 5.5 4.7 5.5  CREATININE 1.23* 1.11* 1.47* 1.37* 1.58* 2.39*  LATICACIDVEN 1.4 1.5  --   --   --  4.1*    Estimated Creatinine Clearance: 21.5 mL/min (A) (by C-G formula based on SCr of 2.39 mg/dL (H)).    Allergies  Allergen Reactions   Lisinopril Anaphylaxis, Swelling and Other (See Comments)    Entire tongue became swollen and the exterior front of the throat.    Amlodipine Swelling and Other (See Comments)    Legs became swollen   Antimicrobials this admission:  Cefepime 2/7 >> Flagyl 2/7 >> (2/14)  Microbiology results:  2/8 GI panel: none detected 2.7 resp panel: none detected 2/7 Bcx: ngtd   Thank you for allowing pharmacy to be a part of this patient's care.  Junita Push, PharmD, BCPS 10/23/2023 5:05 AM

## 2023-10-23 NOTE — Progress Notes (Signed)
PROGRESS NOTE    Misty Deleon  ZOX:[id number] DOB: 1951-01-29 DOA: 10/15/2023 PCP: Marletta Lor, NP   Brief Narrative:  73 y.o. female with medical history significant for cervical cancer on maintenance bevacizumab, T2DM, CKD stage IIIa, HTN, HLD, hypothyroidism, anemia due to B12 deficiency, recent C. difficile infection who presented to the ED for evaluation of persistent nausea, vomiting, and diarrhea. Patient was previously admitted late October 2024 with acute pyelonephritis treated with cephalosporins. She was readmitted shortly after at the beginning of November for C. difficile diarrhea. She was treated with oral vancomycin. CT abdomen/pelvis with contrast showed mild fat stranding surrounding the bladder, mild rectal wall thickening worrisome for proctitis. Nonobstructing right renal calculus, sigmoid colon diverticulosis, stable presacral and perirectal edema.   Assessment & Plan: Principal Problem:   Nausea, vomiting, and diarrhea Active Problems:   Cervical cancer (HCC)   Chronic kidney disease, stage 3a (HCC)   Type 2 diabetes mellitus (HCC)   Vitamin B12 deficiency   Essential hypertension   Hypokalemia   Hypothyroidism   Failure to thrive in adult   Malnutrition of moderate degree  Goals of care discussion  -At bedside with PCCM -Lengthy discussion in regards to patient's wishes and goals of care.  Husband indicates patient would not want to be resuscitated if he were to pass, as such patient has been made DNR. -We discussed potential timeline given patient's acute worsening and that should things continue to progress involving palliative care and hospice would not be unreasonable, husband is unfortunately well acquainted with palliative care and is open to the idea if the patient continues to decline.  Septic shock secondary to proctitis, not POA Lactic acidosis, improving Rule out secondary infection, concern for aspiration overnight -Acute hypotensive event  overnight with questionable aspiration -Requiring pressors, PCCM consulted overnight, transitioning to primary today -In the setting of recent Cdiff last month, abnormal PO intake for months, and loose stool due to tube feeds. -Cultures preliminary negative -Cefepime/Flagyl ongoing -likely 7 to 10-day course -CT abd/pelvis with contrast -generally unremarkable for acute process other than unspecified inflammation of the colon concerning for proctitis -Repeat imaging per PCCM  Acute metabolic encephalopathy, multifactorial -Narcotics discontinued, continues to have low-grade fevers overnight -Malnutrition previously improving with tube feeds -now on hold in the setting of septic shock requiring pressors -Hospital delirium likely ongoing -Septic shock/hypotension/hypovolemia likely exacerbating prior mental status changes  AKI on CKD stage IIIa Hypokalemia -Continue to worsen over the past few days, acutely worse overnight with extremely poor urine output over the past 24 hours -Likely secondary to hypotension/poor perfusion as above -Previously quite edematous, improving over the past 24 hours  Nausea/vomiting/diarrhea/dysgeusia, improving Cannot rule out delayed gastric emptying/gastroparesis Constipation ruled out - Insidious onset over the last couple months, continue to advance tube feeds as tolerated (plan for 25cc/hr - increase by 5cc every 6 hours until at goal: around 50cc/hr) -Unfortunately given patient's septic shock requiring pressors NG tube feeds have been placed on hold -Discussed with patient and family at length that if patient continues to require pressors being unable to tolerate nutrition she will continue to decline -Narcotics discontinued as below per family wishes  -GI panel negative   Oral thrush, POA  -Patient reported mouth pain at intake, exam consistent with thrush -Oral nystatin ordered but family is refusing due to poor tolerance by patient(pain) -Diflucan  400 mg x 1 given 2/9  Low back pain, chronic: - Follows with [facility], MRI performed while in hospital showing endplate compression at  L4, moderate to severe spinal canal narrowing at L3-4 and foraminal narrowing at L3-4 and L4-5 -IR attempting to get patient's procedure preapproved given insurance requires prior authorization.  Procedure on hold now given above clinical status  Cervical cancer: Follows with Dr. Justice Deeds bevacizumab.  In remission. Type 2 diabetes:, Well-controlled -hold p.o. medications in the interim -restart at discharge  Hypertension: Home medications previously held, see hypotension as above Hyperlipidemia: Hold rosuvastatin. Hypothyroidism: Continue Synthroid -changed to IV Anemia/B12 deficiency: Continue supplementation  DVT prophylaxis: enoxaparin (LOVENOX) injection 30 mg Start: 10/23/23 2200 Code Status:   Code Status: Full Code Family Communication: Husband and daughter at bedside  Status is: Inpatient  Dispo: The patient is from: Home              Anticipated d/c is to: To be determined               Anticipated d/c date is: To be determined              Patient currently not medically stable for discharge  Consultants:  Oncology (IR/Ortho sidelined in regards to back pain/spinal narrowing) PCCM  Procedures:  None  Antimicrobials:  None  Subjective: Overnight patient had worsening hypotension somewhat abruptly requiring PCCM consult and pressors.  Concern for aspiration event as well per signout.  Review of systems somewhat limited given patient's mental status  Objective: Vitals:   10/23/23 0200 10/23/23 0300 10/23/23 0308 10/23/23 0309  BP:  (!) 69/50  (!) 104/57  Pulse:  (!) 103  (!) 102  Resp:  15  20  Temp: 99.3 F (37.4 C)     TempSrc: Oral     SpO2:  (!) 85%  100%  Weight:   86.2 kg   Height:        Intake/Output Summary (Last 24 hours) at 10/23/2023 0708 Last data filed at 10/23/2023 0520 Gross per 24 hour  Intake 2599.19  ml  Output 110 ml  Net 2489.19 ml   Filed Weights   10/21/23 0500 10/22/23 0500 10/23/23 0308  Weight: 85.9 kg 87 kg 86.2 kg    Examination:  General: Awake and alert -unable to assess orientation although answers some questions appropriately and can follow commands. Resting comfortably in bed in no acute distress. HEENT: NG tube right nare Neck:  Without mass or deformity.  Trachea is midline. Lungs: Diminished bilaterally without rhonchi, wheeze, or rales. Heart: Tachycardic rate and regular rhythm.  Without murmurs, rubs, or gallops. Abdomen:  Soft, nontender, nondistended. Extremities: Without cyanosis, clubbing, or obvious deformity. Skin:  Warm and dry, no erythema.  Data Reviewed: I have personally reviewed following labs and imaging studies  CBC: Recent Labs  Lab 10/19/23 0237 10/19/23 0344 10/20/23 0438 10/21/23 0500 10/22/23 0500 10/23/23 0228  WBC 4.6 4.9 6.6 5.5 4.7 5.5  NEUTROABS 2.3  --   --   --   --   --   HGB 9.1* 9.1* 8.9* 8.4* 8.4* 8.0*  HCT 27.9* 28.3* 27.8* 26.0* 25.8* 25.2*  MCV 101.5* 100.0 101.1* 98.9 98.5 100.8*  PLT 181 197 182 173 190 165   Basic Metabolic Panel: Recent Labs  Lab 10/19/23 0344 10/20/23 0438 10/21/23 0500 10/22/23 0500 10/22/23 1700 10/23/23 0228  NA 135 135 133* 133*  --  134*  K 3.7 3.3* 2.9* 3.4*  --  3.2*  CL 104 105 103 106  --  107  CO2 19* 18* 20* 19*  --  16*  GLUCOSE 92 88 77 80  --  140*  BUN 15 15 14 14   --  18  CREATININE 1.11* 1.47* 1.37* 1.58*  --  2.39*  CALCIUM 9.3 8.9 8.5* 8.6*  --  8.5*  MG  --  1.1*  --   --  1.4* 1.5*  PHOS  --  3.3  --   --  1.6* 1.4*   GFR: Estimated Creatinine Clearance: 21.5 mL/min (A) (by C-G formula based on SCr of 2.39 mg/dL (H)). Liver Function Tests: Recent Labs  Lab 10/19/23 0344 10/20/23 0438 10/21/23 0500 10/22/23 0500 10/23/23 0228  AST 18 21 21 28  40  ALT 8 9 8 11 11   ALKPHOS 58 60 58 78 101  BILITOT 1.0 1.1 0.9 1.0 1.3*  PROT 5.4* 5.0* 4.7* 4.7* 4.4*   ALBUMIN 2.5* 2.3* 2.1* 2.0* 1.8*    Recent Results (from the past 240 hours)  Resp panel by RT-PCR (RSV, Flu A&B, Covid) Anterior Nasal Swab     Status: None   Collection Time: 10/15/23 10:18 PM   Specimen: Anterior Nasal Swab  Result Value Ref Range Status   SARS Coronavirus 2 by RT PCR NEGATIVE NEGATIVE Final    Comment: (NOTE) SARS-CoV-2 target nucleic acids are NOT DETECTED.  The SARS-CoV-2 RNA is generally detectable in upper respiratory specimens during the acute phase of infection. The lowest concentration of SARS-CoV-2 viral copies this assay can detect is 138 copies/mL. A negative result does not preclude SARS-Cov-2 infection and should not be used as the sole basis for treatment or other patient management decisions. A negative result may occur with  improper specimen collection/handling, submission of specimen other than nasopharyngeal swab, presence of viral mutation(s) within the areas targeted by this assay, and inadequate number of viral copies(<138 copies/mL). A negative result must be combined with clinical observations, patient history, and epidemiological information. The expected result is Negative.  Fact Sheet for Patients:  BloggerCourse.com  Fact Sheet for Healthcare Providers:  SeriousBroker.it  This test is no t yet approved or cleared by the [place] FDA and  has been authorized for detection and/or diagnosis of SARS-CoV-2 by FDA under an Emergency Use Authorization (EUA). This EUA will remain  in effect (meaning this test can be used) for the duration of the COVID-19 declaration under Section 564(b)(1) of the Act, 21 U.S.C.section 360bbb-3(b)(1), unless the authorization is terminated  or revoked sooner.       Influenza A by PCR NEGATIVE NEGATIVE Final   Influenza B by PCR NEGATIVE NEGATIVE Final    Comment: (NOTE) The Xpert Xpress SARS-CoV-2/FLU/RSV plus assay is intended as an aid in the  diagnosis of influenza from Nasopharyngeal swab specimens and should not be used as a sole basis for treatment. Nasal washings and aspirates are unacceptable for Xpert Xpress SARS-CoV-2/FLU/RSV testing.  Fact Sheet for Patients: BloggerCourse.com  Fact Sheet for Healthcare Providers: SeriousBroker.it  This test is not yet approved or cleared by the [place] FDA and has been authorized for detection and/or diagnosis of SARS-CoV-2 by FDA under an Emergency Use Authorization (EUA). This EUA will remain in effect (meaning this test can be used) for the duration of the COVID-19 declaration under Section 564(b)(1) of the Act, 21 U.S.C. section 360bbb-3(b)(1), unless the authorization is terminated or revoked.     Resp Syncytial Virus by PCR NEGATIVE NEGATIVE Final    Comment: (NOTE) Fact Sheet for Patients: BloggerCourse.com  Fact Sheet for Healthcare Providers: SeriousBroker.it  This test is not yet approved or cleared by the [place] FDA and has  been authorized for detection and/or diagnosis of SARS-CoV-2 by FDA under an Emergency Use Authorization (EUA). This EUA will remain in effect (meaning this test can be used) for the duration of the COVID-19 declaration under Section 564(b)(1) of the Act, 21 U.S.C. section 360bbb-3(b)(1), unless the authorization is terminated or revoked.  Performed at [facility], [address] [place], [place] [address]   SARS Coronavirus 2 by RT PCR (hospital order, performed in [facility] hospital lab) *cepheid single result test* Urine, Clean Catch     Status: None   Collection Time: 10/19/23  3:05 AM   Specimen: Urine, Clean Catch; Nasal Swab  Result Value Ref Range Status   SARS Coronavirus 2 by RT PCR NEGATIVE NEGATIVE Final    Comment: (NOTE) SARS-CoV-2 target nucleic acids are NOT DETECTED.  The SARS-CoV-2 RNA  is generally detectable in upper and lower respiratory specimens during the acute phase of infection. The lowest concentration of SARS-CoV-2 viral copies this assay can detect is 250 copies / mL. A negative result does not preclude SARS-CoV-2 infection and should not be used as the sole basis for treatment or other patient management decisions.  A negative result may occur with improper specimen collection / handling, submission of specimen other than nasopharyngeal swab, presence of viral mutation(s) within the areas targeted by this assay, and inadequate number of viral copies (<250 copies / mL). A negative result must be combined with clinical observations, patient history, and epidemiological information.  Fact Sheet for Patients:   RoadLapTop.co.za  Fact Sheet for Healthcare Providers: http://kim-miller.com/  This test is not yet approved or  cleared by the [place] FDA and has been authorized for detection and/or diagnosis of SARS-CoV-2 by FDA under an Emergency Use Authorization (EUA).  This EUA will remain in effect (meaning this test can be used) for the duration of the COVID-19 declaration under Section 564(b)(1) of the Act, 21 U.S.C. section 360bbb-3(b)(1), unless the authorization is terminated or revoked sooner.  Performed at [facility], [address], [place], [place] [address]   Respiratory (~20 pathogens) panel by PCR     Status: None   Collection Time: 10/19/23  3:05 AM   Specimen: Nasopharyngeal Swab; Respiratory  Result Value Ref Range Status   Adenovirus NOT DETECTED NOT DETECTED Final   Coronavirus 229E NOT DETECTED NOT DETECTED Final    Comment: (NOTE) The Coronavirus on the Respiratory Panel, DOES NOT test for the novel  Coronavirus (2019 nCoV)    Coronavirus HKU1 NOT DETECTED NOT DETECTED Final   Coronavirus NL63 NOT DETECTED NOT DETECTED Final   Coronavirus OC43 NOT DETECTED NOT  DETECTED Final   Metapneumovirus NOT DETECTED NOT DETECTED Final   Rhinovirus / Enterovirus NOT DETECTED NOT DETECTED Final   Influenza A NOT DETECTED NOT DETECTED Final   Influenza B NOT DETECTED NOT DETECTED Final   Parainfluenza Virus 1 NOT DETECTED NOT DETECTED Final   Parainfluenza Virus 2 NOT DETECTED NOT DETECTED Final   Parainfluenza Virus 3 NOT DETECTED NOT DETECTED Final   Parainfluenza Virus 4 NOT DETECTED NOT DETECTED Final   Respiratory Syncytial Virus NOT DETECTED NOT DETECTED Final   Bordetella pertussis NOT DETECTED NOT DETECTED Final   Bordetella Parapertussis NOT DETECTED NOT DETECTED Final   Chlamydophila pneumoniae NOT DETECTED NOT DETECTED Final   Mycoplasma pneumoniae NOT DETECTED NOT DETECTED Final    Comment: Performed at [facility], [address] [place], [place] [address]  Culture, blood (x 2)  Status: None (Preliminary result)   Collection Time: 10/19/23  3:44 AM   Specimen: BLOOD  Result Value Ref Range Status   Specimen Description   Final    BLOOD RIGHT ANTECUBITAL Performed at [facility], [address] [place], [place] [address]    Special Requests   Final    BOTTLES DRAWN AEROBIC AND ANAEROBIC Blood Culture results may not be optimal due to an inadequate volume of blood received in culture bottles Performed at [facility], [address], [place], [place] [address]    Culture   Final    NO GROWTH 2 DAYS Performed at [facility], [address], [place], [place] [address]    Report Status PENDING  Incomplete  Culture, blood (x 2)     Status: None (Preliminary result)   Collection Time: 10/19/23  3:44 AM   Specimen: BLOOD RIGHT HAND  Result Value Ref Range Status   Specimen Description   Final    BLOOD RIGHT HAND Performed at [facility], [address] [place], [place] [address]    Special Requests   Final    BOTTLES DRAWN AEROBIC AND ANAEROBIC Blood Culture  adequate volume Performed at [facility], [address], [place], [place] [address]    Culture   Final    NO GROWTH 2 DAYS Performed at [facility], [address], [place], [place] [address]    Report Status PENDING  Incomplete  Gastrointestinal Panel by PCR , Stool     Status: None   Collection Time: 10/20/23  2:35 AM   Specimen: Stool  Result Value Ref Range Status   Campylobacter species NOT DETECTED NOT DETECTED Final   Plesimonas shigelloides NOT DETECTED NOT DETECTED Final   Salmonella species NOT DETECTED NOT DETECTED Final   Yersinia enterocolitica NOT DETECTED NOT DETECTED Final   Vibrio species NOT DETECTED NOT DETECTED Final   Vibrio cholerae NOT DETECTED NOT DETECTED Final   Enteroaggregative E coli (EAEC) NOT DETECTED NOT DETECTED Final   Enteropathogenic E coli (EPEC) NOT DETECTED NOT DETECTED Final   Enterotoxigenic E coli (ETEC) NOT DETECTED NOT DETECTED Final   Shiga like toxin producing E coli (STEC) NOT DETECTED NOT DETECTED Final   Shigella/Enteroinvasive E coli (EIEC) NOT DETECTED NOT DETECTED Final   Cryptosporidium NOT DETECTED NOT DETECTED Final   Cyclospora cayetanensis NOT DETECTED NOT DETECTED Final   Entamoeba histolytica NOT DETECTED NOT DETECTED Final   Giardia lamblia NOT DETECTED NOT DETECTED Final   Adenovirus F40/41 NOT DETECTED NOT DETECTED Final   Astrovirus NOT DETECTED NOT DETECTED Final   Norovirus GI/GII NOT DETECTED NOT DETECTED Final   Rotavirus A NOT DETECTED NOT DETECTED Final   Sapovirus (I, II, IV, and V) NOT DETECTED NOT DETECTED Final    Comment: Performed at [facility], [address], [place], [place] [address]      Radiology Studies: DG CHEST PORT 1 VIEW Result Date: 10/23/2023 CLINICAL DATA:  [id number] Aspiration into airway [id number] EXAM: PORTABLE CHEST 1 VIEW COMPARISON:  Chest x-ray 10/19/2023, CT chest 09/28/2023 FINDINGS: Patient is rotated. Right chest wall accessed Port-A-Cath with  tip terminating along the superior caval junction. Enteric tube courses below the hemidiaphragm with tip and side port overlying the expected region of the gastric lumen. The heart and mediastinal contours are within normal limits. Nonvisualization of the left hemidiaphragm and blunting of left costophrenic angle suggestive of airspace opacity and/or pleural effusion.  No pulmonary edema. No right pleural effusion. No pneumothorax. No acute osseous abnormality. IMPRESSION: Nonvisualization of the left hemidiaphragm and blunting of left costophrenic angle suggestive of airspace opacity and/or pleural effusion. Finding may also be due to patient rotation. Recommend further evaluation with PA and lateral view of the chest with improved patient positioning. Electronically Signed   By: Tish Frederickson M.D.   On: 10/23/2023 01:33   Scheduled Meds:  Chlorhexidine Gluconate Cloth  6 each Topical Daily   enoxaparin (LOVENOX) injection  30 mg Subcutaneous Q24H   famotidine  10 mg Per Tube QHS   feeding supplement (PROSource TF20)  60 mL Per Tube Daily   insulin aspart  1-3 Units Subcutaneous Q4H   ipratropium-albuterol  3 mL Nebulization QID   levothyroxine  50 mcg Per Tube QAC breakfast   nystatin  5 mL Oral QID   mouth rinse  15 mL Mouth Rinse 4 times per day   rosuvastatin  40 mg Per Tube QHS   saccharomyces boulardii  250 mg Per Tube BID   thiamine  100 mg Per Tube Daily   Continuous Infusions:  sodium chloride 10 mL/hr at 10/23/23 0520   [START ON 10/24/2023] ceFEPime (MAXIPIME) IV     feeding supplement (OSMOLITE 1.5 CAL) Stopped (10/23/23 0002)   lactated ringers 125 mL/hr at 10/23/23 0634   metronidazole Stopped (10/23/23 0347)   norepinephrine (LEVOPHED) Adult infusion 11 mcg/min (10/23/23 0520)   potassium chloride 10 mEq (10/23/23 0623)   potassium PHOSPHATE IVPB (in mmol) 64.4 mL/hr at 10/23/23 0520   vancomycin 1,000 mg (10/23/23 1610)     LOS: 5 days   Time spent:  Azucena Fallen, DO [facility]  If 7PM-7AM, please contact night-coverage [email or web address]  10/23/2023, 7:08 AM

## 2023-10-23 NOTE — Plan of Care (Signed)
Problem: Education: Goal: Knowledge of General Education information will improve Description: Including pain rating scale, medication(s)/side effects and non-pharmacologic comfort measures Outcome: Not Progressing   Problem: Health Behavior/Discharge Planning: Goal: Ability to manage health-related needs will improve Outcome: Not Progressing   Problem: Clinical Measurements: Goal: Ability to maintain clinical measurements within normal limits will improve Outcome: Not Progressing   Problem: Activity: Goal: Risk for activity intolerance will decrease Outcome: Not Progressing   Problem: Nutrition: Goal: Adequate nutrition will be maintained Outcome: Not Progressing

## 2023-10-23 NOTE — Progress Notes (Signed)
Met with husband, pt SIL and family friend at the bedside-- they had questions re the CT scan ordered overnight, pending completion this afternoon.   We talked about overall goals of care-- both related to her critical illness and the underlying FTT/malnutrition.   Decision reached to start shifting the focus of our medical care to be less focussed on aggressive and invasive measures while family comes in from out of town   GOC DNR Shock FTT Malnutrition P -DNR -cont pressors. Ok to titrate within order parameters, will not add additional pressors or raise NE beyond usual guardrails  -cont abx to complete 7d course  -dc NGT, EN -dc labs, imaging, remaining meds/interventions not focussed on comfort  -PRN glyco  -defer adding opiates and BZD for now -- Sounds like she is very sensitive to these and they are hoping to enjoy a little time talking with her still. She has no outward s/sx pain/anxiety so this is reasonable. If these or resp sx arise, will add.  -if her shock does not improve, unlikely to be stable for home hospice when out of town family arrives and the consideration would be for in-patient comfort care. If we were able to stabilize, husband's preference would be for home hospice     Add'l CCT 28 min    Tessie Fass MSN, AGACNP-BC [facility] Pulmonary/Critical Care Medicine 10/23/2023, 2:49 PM

## 2023-10-23 NOTE — Progress Notes (Signed)
MEWS Progress Note  Patient Details Name: Misty Deleon MRN: [id number] DOB: Mar 07, 1951 Today's Date: 10/23/2023   MEWS Flowsheet Documentation:  Assess: MEWS Score Temp: (!) 103.1 F (39.5 C) BP: (!) 89/77 MAP (mmHg): 81 Pulse Rate: 92 ECG Heart Rate: (!) 107 Resp: 20 Level of Consciousness: Responds to Voice SpO2: 96 % O2 Device: Room Air Patient Activity (if Appropriate): In bed Assess: MEWS Score MEWS Temp: 2 MEWS Systolic: 1 MEWS Pulse: 0 MEWS RR: 0 MEWS LOC: 1 MEWS Score: 4 MEWS Score Color: Red Assess: SIRS CRITERIA SIRS Temperature : 1 SIRS Respirations : 0 SIRS Pulse: 1 SIRS WBC: 0 SIRS Score Sum : 2 SIRS Temperature : 1 SIRS Pulse: 1 SIRS Respirations : 0 SIRS WBC: 0 SIRS Score Sum : 2 Assess: if the MEWS score is Yellow or Red Were vital signs accurate and taken at a resting state?: Yes Does the patient meet 2 or more of the SIRS criteria?: Yes Does the patient have a confirmed or suspected source of infection?: No MEWS guidelines implemented : Yes, red Treat MEWS Interventions: Considered administering scheduled or prn medications/treatments as ordered Take Vital Signs Increase Vital Sign Frequency : Red: Q1hr x2, continue Q4hrs until patient remains green for 12hrs Escalate MEWS: Escalate: Red: Discuss with charge nurse and notify provider. Consider notifying RRT. If remains red for 2 hours consider need for higher level of care        Molinda Bailiff 10/23/2023, 12:51 AM

## 2023-10-23 NOTE — Progress Notes (Addendum)
NAME:  Misty Deleon, MRN:  [id number], DOB:  Jan 29, 1951, LOS: 5 ADMISSION DATE:  10/15/2023, CONSULTATION DATE: 10/23/2023 REFERRING MD:  Luiz Iron, NP, CHIEF COMPLAINT: Shock  History of Present Illness:  A 73 y.o. female with cervical cancer on maintenance bevacizumab (last dose was 2 weeks ago), DM-2, CKD-3b, HTN, dyslipidemia, hypothyroidism, anemia of chronic illness, Vit B12 def, recent C. difficile infection (Nov 2024, UTI in Oct 2024) who presented to the ED for evaluation of persistent nausea, vomiting, and diarrhea. Oct 15, 2023 CT abdomen/pelvis with contrast showed proctitis, she was started on Cefepime and Flagyl. Early am today, she developed fever 103 F, sinus tachycardia, and tachypnea 30-40, and hypotension. She was given 1 L LR, Tylenol, Ibuprofen, and ice packs. She was started on Levophed due to non responsiveness to LR bolus, currently on 10 mcg/min. She is on NGT TF. No reported diarrhea or gross aspiration.    In speaking with her husband at bedside following ICU transfer, sounds like she has had a prolonged progressive decline, specifically from a nutr status standpoint -- dating back before thanksgiving 2024   Pertinent  Medical History  Cervical cancer on maintenance bevacizumab (last dose was 2 weeks ago), DM-2, CKD-3b, HTN, dyslipidemia, hypothyroidism, anemia of chronic illness, recent C. difficile infection (Nov 2024, UTI in Oct 2024), Vit B12 def  Significant Hospital Events: Including procedures, antibiotic start and stop dates in addition to other pertinent events   2/3 admitted w nv  2/7 ongoing lethargy, poor intake. Ngt placed . Anatomy felt possibly suitable to VP/KP, but w fevers eval deferred   2/8 did not tolerate diet progression 2/9 tolerated 25cc/hr EN 2/10 worse delirium, tried to adv diet but sounds like family wanted to defer this overnight 2/11 rapid overnight w fevers hypotension new LA   Interim History / Subjective:  Txf to ICU  overnight w escalating pressor req   Objective   Blood pressure 106/61, pulse 95, temperature (!) 96.8 F (36 C), temperature source Axillary, resp. rate 16, height 5\' 1"  (1.549 m), weight 86.2 kg, SpO2 100%.        Intake/Output Summary (Last 24 hours) at 10/23/2023 1142 Last data filed at 10/23/2023 0744 Gross per 24 hour  Intake 3169.87 ml  Output 110 ml  Net 3059.87 ml   Filed Weights   10/21/23 0500 10/22/23 0500 10/23/23 0308  Weight: 85.9 kg 87 kg 86.2 kg    Examination: General: Chronically ill appearing F NAD  Neuro: Awake, disoriented.  HENT:  Lungs: Even unlabored  Cardiovascular: rr  Abdomen: soft round  Extremities: no acute joint deformity    Resolved Hospital Problem list     Assessment & Plan:   GOC DNR FTT in adult -DNR -think the coming day(s) will be telling re her critical illness. If she worsens, is likely that this critical illness course is not survivable. Family would not want to prolong inevitable in that case -if her critical illness improves we are still left with navigating her chronic malnutrition, and sequelae thereof-- and would discuss palliative care vs ongoing aggressive medical interventions.  Shock, favor septic Lactic acidosis  -pna v iai v uti v bacteremia, in this immuno comp pt w long-standing R chest port  -CT a/p W on 2/8 with some areas c/f proctitis. No abscess, fistula etc. Unremarkable GB.  P  -continues on a course of flagyl cefepime -send MRSA swab-- has rcvd 1x vanc prior to PCR -follow Bcx -ECHO -lower extremity US.  -albumin +  LR  -a CT c/a/p is ordered -follow LA   Acute encephalopathy -delirium precautions -tx confounding metabolic factors   Acute resp failure w hypoxia  ?Aspiration PNA  Enlarging LLL nodles (CT c/a/p 1/17)  Oral thrush P -wean O2 for goal >92 -IS -abx as above -nystatin  AKI on CKD IIIb NAGMA R renal calculi  P -trend renal indices, UOP -strict I/O.  -if UOP isnt perking up  w MAP>65, consider a renal US-- though a few days ago had a CT a/p W & there was no evidence of hydronephrosis  -defer foley for now  Protein calorie malnutrition ?Refeeding  HypoK HypoMag Hypophos  Poor PO intake Persistent n/v Hx Cdiff (Nov 2024)  P -aggressive lyte replacement  -EN as tolerated. Sounds like this has been difficult.  -Big picture, nutr status is our biggest hurdle. Sounds like she has no appetite, + n/v. Would be a poor TPN candidate and I'm not sure if measures like a PEG would align w her QOL goals. Will need to talk w husband pending how our critical illness course goes.   DM P -SSI  Hypothyroidism -synthroid   L3-4, L4-5 narrowing -if she were to recover, anatomically is felt a candidate for KP/VP, but I would really worry about her poor nutr status / functional candidacy  Metastatic cervical Ca (IVB) -now on maintenance    Best Practice (right click and "Reselect all SmartList Selections" daily)   Diet/type: tubefeeds DVT prophylaxis LMWH Pressure ulcer(s): N/A GI prophylaxis: H2B Lines: port-a-cath Foley:  N/A Code Status:  full code Last date of multidisciplinary goals of care discussion []   Labs   CBC: Recent Labs  Lab 10/19/23 0237 10/19/23 0344 10/20/23 0438 10/21/23 0500 10/22/23 0500 10/23/23 0228  WBC 4.6 4.9 6.6 5.5 4.7 5.5  NEUTROABS 2.3  --   --   --   --   --   HGB 9.1* 9.1* 8.9* 8.4* 8.4* 8.0*  HCT 27.9* 28.3* 27.8* 26.0* 25.8* 25.2*  MCV 101.5* 100.0 101.1* 98.9 98.5 100.8*  PLT 181 197 182 173 190 165    Basic Metabolic Panel: Recent Labs  Lab 10/19/23 0344 10/20/23 0438 10/21/23 0500 10/22/23 0500 10/22/23 1700 10/23/23 0228  NA 135 135 133* 133*  --  134*  K 3.7 3.3* 2.9* 3.4*  --  3.2*  CL 104 105 103 106  --  107  CO2 19* 18* 20* 19*  --  16*  GLUCOSE 92 88 77 80  --  140*  BUN 15 15 14 14   --  18  CREATININE 1.11* 1.47* 1.37* 1.58*  --  2.39*  CALCIUM 9.3 8.9 8.5* 8.6*  --  8.5*  MG  --  1.1*  --    --  1.4* 1.5*  PHOS  --  3.3  --   --  1.6* 1.4*   GFR: Estimated Creatinine Clearance: 21.5 mL/min (A) (by C-G formula based on SCr of 2.39 mg/dL (H)). Recent Labs  Lab 10/19/23 0237 10/19/23 0344 10/20/23 0438 10/21/23 0500 10/22/23 0500 10/23/23 0228 10/23/23 0653  PROCALCITON 16.38  --   --   --   --   --   --   WBC 4.6 4.9 6.6 5.5 4.7 5.5  --   LATICACIDVEN 1.4 1.5  --   --   --  4.1* 3.3*    Liver Function Tests: Recent Labs  Lab 10/19/23 0344 10/20/23 0438 10/21/23 0500 10/22/23 0500 10/23/23 0228  AST 18 21 21 28  40  ALT 8 9 8 11 11   ALKPHOS 58 60 58 78 101  BILITOT 1.0 1.1 0.9 1.0 1.3*  PROT 5.4* 5.0* 4.7* 4.7* 4.4*  ALBUMIN 2.5* 2.3* 2.1* 2.0* 1.8*   No results for input(s): "LIPASE", "AMYLASE" in the last 168 hours. Recent Labs  Lab 10/19/23 0237 10/23/23 0653  AMMONIA 18 29    ABG    Component Value Date/Time   TCO2 24 12/08/2020 0636     Coagulation Profile: No results for input(s): "INR", "PROTIME" in the last 168 hours.  Cardiac Enzymes: No results for input(s): "CKTOTAL", "CKMB", "CKMBINDEX", "TROPONINI" in the last 168 hours.  HbA1C: Hgb A1c MFr Bld  Date/Time Value Ref Range Status  02/02/2023 08:41 AM 5.1 4.8 - 5.6 % Final    Comment:    (NOTE)         Prediabetes: 5.7 - 6.4         Diabetes: >6.4         Glycemic control for adults with diabetes: <7.0   01/04/2023 01:07 PM 4.5 (L) 4.8 - 5.6 % Final    Comment:    (NOTE)         Prediabetes: 5.7 - 6.4         Diabetes: >6.4         Glycemic control for adults with diabetes: <7.0     CBG: Recent Labs  Lab 10/22/23 1650 10/22/23 2327 10/23/23 0818 10/23/23 1133  GLUCAP 97 148* 174* 190*    CRITICAL CARE Performed by: Lanier Clam   Total critical care time: 42 minutes  Critical care time was exclusive of separately billable procedures and treating other patients. Critical care was necessary to treat or prevent imminent or life-threatening  deterioration.  Critical care was time spent personally by me on the following activities: development of treatment plan with patient and/or surrogate as well as nursing, discussions with consultants, evaluation of patient's response to treatment, examination of patient, obtaining history from patient or surrogate, ordering and performing treatments and interventions, ordering and review of laboratory studies, ordering and review of radiographic studies, pulse oximetry and re-evaluation of patient's condition.  Tessie Fass MSN, AGACNP-BC [facility] Amion for pager  10/23/2023, 11:42 AM

## 2023-10-23 NOTE — Progress Notes (Addendum)
       Overnight   NAME: KIMISHA EUNICE MRN: [id number] DOB : Jun 27, 1951    Date of Service   10/23/2023   HPI/Events of Note     Notified by RN for tachycardia, tachypnea.  73 year old female medical history significant for cervical cancer on maintenance bevacizumab, T2DM, CKD stage IIIa, hypertension, HLD, hypothyroidism, anemia due to B12 deficiency, recent C. difficile infection.  Admitted through the ER for persistent nausea, vomiting, diarrhea.  Bedside visit.  Patient tachycardic 140 beats per minute. Tachypneic respiratory rate 40. Temperature 103.1 F.  Patient has been given Tylenol just prior to arrival. Tube feeding had been stopped due to known history of nausea, vomiting, diarrhea, dysgeusia, rule out delayed gastric emptying, gastroparesis, constipation. Consideration for possible aspiration.  Patient is making urine. Lung sounds are clear   Interventions/ Plan   Tylenol/ibuprofen. Ice packs Room cooling in progress 1 to 2 L/min [place] O2-temporary (SpO2 is sufficient without however respiratory rate/temp is increased.) Hold tube feeding for return of x-ray results.        Update 0103 hrs. Temperature is resolving Tachypnea is resolving Chest x-ray pending Bolus infusing due to Low BP   Update 0135 hrs. Transfer to SDU - Hypotension   "FINDINGS: Patient is rotated.   Right chest wall accessed Port-A-Cath with tip terminating along the superior caval junction. Enteric tube courses below the hemidiaphragm with tip and side port overlying the expected region of the gastric lumen.   The heart and mediastinal contours are within normal limits.   Nonvisualization of the left hemidiaphragm and blunting of left costophrenic angle suggestive of airspace opacity and/or pleural effusion. No pulmonary edema. No right pleural effusion. No pneumothorax.   No acute osseous abnormality.   IMPRESSION: Nonvisualization of the left hemidiaphragm and  blunting of left costophrenic angle suggestive of airspace opacity and/or pleural effusion. Finding may also be due to patient rotation. Recommend further evaluation with PA and lateral view of the chest with improved patient positioning.     Electronically Signed   By: Tish Frederickson M.D.   On: 10/23/2023 01:33 "    Chinita Greenland BSN MSNA MSN ACNPC-AG Acute Care Nurse Practitioner [facility]

## 2023-10-23 NOTE — Consult Note (Signed)
NAME:  Misty Deleon, MRN:  [id number], DOB:  07-18-1951, LOS: 5 ADMISSION DATE:  10/15/2023, CONSULTATION DATE: 10/23/2023 REFERRING MD:  Luiz Iron, NP, CHIEF COMPLAINT: Shock  History of Present Illness:  A 73 y.o. female with cervical cancer on maintenance bevacizumab (last dose was 2 weeks ago), DM-2, CKD-3b, HTN, dyslipidemia, hypothyroidism, anemia of chronic illness, Vit B12 def, recent C. difficile infection (Nov 2024, UTI in Oct 2024) who presented to the ED for evaluation of persistent nausea, vomiting, and diarrhea. Oct 15, 2023 CT abdomen/pelvis with contrast showed proctitis, she was started on Cefepime and Flagyl. Early am today, she developed fever 103 F, sinus tachycardia, and tachypnea 30-40, and hypotension. She was given 1 L LR, Tylenol, Ibuprofen, and ice packs. She was started on Levophed due to non responsiveness to LR bolus, currently on 10 mcg/min. She is on NGT TF. No reported diarrhea or gross aspiration.    Pertinent  Medical History  Cervical cancer on maintenance bevacizumab (last dose was 2 weeks ago), DM-2, CKD-3b, HTN, dyslipidemia, hypothyroidism, anemia of chronic illness, recent C. difficile infection (Nov 2024, UTI in Oct 2024), Vit B12 def  Significant Hospital Events: Including procedures, antibiotic start and stop dates in addition to other pertinent events     Interim History / Subjective:    Objective   Blood pressure (!) 104/57, pulse (!) 102, temperature 99.3 F (37.4 C), temperature source Oral, resp. rate 20, height 5\' 1"  (1.549 m), weight 86.2 kg, SpO2 100%.        Intake/Output Summary (Last 24 hours) at 10/23/2023 0530 Last data filed at 10/23/2023 0520 Gross per 24 hour  Intake 2599.19 ml  Output 110 ml  Net 2489.19 ml   Filed Weights   10/21/23 0500 10/22/23 0500 10/23/23 0308  Weight: 85.9 kg 87 kg 86.2 kg    Examination: General: alert, oriented x1, and comfortable. On 2 L [place]. SpO2 100%  HENT: PERL, normal pharynx and  oral mucosa. No LNE or thyromegaly. No JVD Lungs: symmetrical air entry bilaterally. No crackles or wheezing Cardiovascular: NL S1/S2. No m/g/r Abdomen: no distension or tenderness Extremities: trace edema. Symmetrical  Neuro: nonfocal   Resolved Hospital Problem list     Assessment & Plan:  Shock, septic: source could be intra-abd or aspiration PNA -Continue Flagyl and Cefepime. Add one dose of Vanco -MRSA screening -IVF -I/O chart -Serial LA and trop -Echo -Blood Cx -CT chest, abd, pelvis wo contrast -CPT -I/S -Duoneb -Cortisol level  AKI/CKD-3b -IVF -Serial labs -Avoid nephrotoxic drugs  Oral thrush -Nystatin   HypoK, hypoPO4, hypoMg -Replace -Am labs  HypoNa -Monitor Na level  Lactic acidosis/AGMA -as above  Cervical cancer on maintenance bevacizumab  DM-2 -ISS -HbA1c  HTN -Hold BP meds  Dyslipidemia -Continue statin   Hypothyroidism -TF4  Anemia of chronic illness and Vit B12 def: Vit B12 level is adequate  -Monitor CBC  Metabolic encephalopathy due to sepsis -Ammonia level   Best Practice (right click and "Reselect all SmartList Selections" daily)   Diet/type: tubefeeds DVT prophylaxis LMWH Pressure ulcer(s): N/A GI prophylaxis: H2B Lines: port-a-cath Foley:  N/A Code Status:  full code Last date of multidisciplinary goals of care discussion []   Labs   CBC: Recent Labs  Lab 10/19/23 0237 10/19/23 0344 10/20/23 0438 10/21/23 0500 10/22/23 0500 10/23/23 0228  WBC 4.6 4.9 6.6 5.5 4.7 5.5  NEUTROABS 2.3  --   --   --   --   --   HGB 9.1* 9.1* 8.9*  8.4* 8.4* 8.0*  HCT 27.9* 28.3* 27.8* 26.0* 25.8* 25.2*  MCV 101.5* 100.0 101.1* 98.9 98.5 100.8*  PLT 181 197 182 173 190 165    Basic Metabolic Panel: Recent Labs  Lab 10/19/23 0344 10/20/23 0438 10/21/23 0500 10/22/23 0500 10/22/23 1700 10/23/23 0228  NA 135 135 133* 133*  --  134*  K 3.7 3.3* 2.9* 3.4*  --  3.2*  CL 104 105 103 106  --  107  CO2 19* 18* 20* 19*   --  16*  GLUCOSE 92 88 77 80  --  140*  BUN 15 15 14 14   --  18  CREATININE 1.11* 1.47* 1.37* 1.58*  --  2.39*  CALCIUM 9.3 8.9 8.5* 8.6*  --  8.5*  MG  --  1.1*  --   --  1.4* 1.5*  PHOS  --  3.3  --   --  1.6* 1.4*   GFR: Estimated Creatinine Clearance: 21.5 mL/min (A) (by C-G formula based on SCr of 2.39 mg/dL (H)). Recent Labs  Lab 10/19/23 0237 10/19/23 0344 10/20/23 0438 10/21/23 0500 10/22/23 0500 10/23/23 0228  PROCALCITON 16.38  --   --   --   --   --   WBC 4.6 4.9 6.6 5.5 4.7 5.5  LATICACIDVEN 1.4 1.5  --   --   --  4.1*    Liver Function Tests: Recent Labs  Lab 10/19/23 0344 10/20/23 0438 10/21/23 0500 10/22/23 0500 10/23/23 0228  AST 18 21 21 28  40  ALT 8 9 8 11 11   ALKPHOS 58 60 58 78 101  BILITOT 1.0 1.1 0.9 1.0 1.3*  PROT 5.4* 5.0* 4.7* 4.7* 4.4*  ALBUMIN 2.5* 2.3* 2.1* 2.0* 1.8*   No results for input(s): "LIPASE", "AMYLASE" in the last 168 hours. Recent Labs  Lab 10/19/23 0237  AMMONIA 18    ABG    Component Value Date/Time   TCO2 24 12/08/2020 0636     Coagulation Profile: No results for input(s): "INR", "PROTIME" in the last 168 hours.  Cardiac Enzymes: No results for input(s): "CKTOTAL", "CKMB", "CKMBINDEX", "TROPONINI" in the last 168 hours.  HbA1C: Hgb A1c MFr Bld  Date/Time Value Ref Range Status  02/02/2023 08:41 AM 5.1 4.8 - 5.6 % Final    Comment:    (NOTE)         Prediabetes: 5.7 - 6.4         Diabetes: >6.4         Glycemic control for adults with diabetes: <7.0   01/04/2023 01:07 PM 4.5 (L) 4.8 - 5.6 % Final    Comment:    (NOTE)         Prediabetes: 5.7 - 6.4         Diabetes: >6.4         Glycemic control for adults with diabetes: <7.0     CBG: Recent Labs  Lab 10/22/23 1650 10/22/23 2327  GLUCAP 97 148*    Review of Systems:   AMS  Past Medical History:  She,  has a past medical history of CKD (chronic kidney disease), stage III (HCC), Deficiency anemia, GERD (gastroesophageal reflux disease),  History of radiation therapy (09/22/20-11/01/20), History of radiation therapy (11/09/2020-12/08/2020), Hypercholesteremia, Hypertension, Hypomagnesemia, Malignant neoplasm cervix [facility]) (oncologist--- dr gorsuch/  radiation oncology--- dr Roselind Messier), PCOS (polycystic ovarian syndrome), PMB (postmenopausal bleeding), Type 2 diabetes mellitus (HCC), and Wears contact lenses.   Surgical History:   Past Surgical History:  Procedure Laterality Date   CESAREAN  SECTION  x2 last one 1977   IR IMAGING GUIDED PORT INSERTION  09/16/2020   OPERATIVE ULTRASOUND N/A 11/09/2020   Procedure: OPERATIVE ULTRASOUND;  Surgeon: Antony Blackbird, MD;  Location: [facility];  Service: Urology;  Laterality: N/A;   OPERATIVE ULTRASOUND N/A 11/16/2020   Procedure: OPERATIVE ULTRASOUND;  Surgeon: Antony Blackbird, MD;  Location: [facility];  Service: Urology;  Laterality: N/A;   OPERATIVE ULTRASOUND N/A 11/25/2020   Procedure: OPERATIVE ULTRASOUND;  Surgeon: Antony Blackbird, MD;  Location: [facility];  Service: Urology;  Laterality: N/A;   OPERATIVE ULTRASOUND N/A 11/29/2020   Procedure: OPERATIVE ULTRASOUND;  Surgeon: Antony Blackbird, MD;  Location: [facility];  Service: Urology;  Laterality: N/A;   OPERATIVE ULTRASOUND N/A 12/08/2020   Procedure: OPERATIVE ULTRASOUND;  Surgeon: Antony Blackbird, MD;  Location: [facility];  Service: Urology;  Laterality: N/A;   TANDEM RING INSERTION N/A 11/09/2020   Procedure: TANDEM RING INSERTION;  Surgeon: Antony Blackbird, MD;  Location: [facility];  Service: Urology;  Laterality: N/A;   TANDEM RING INSERTION N/A 11/16/2020   Procedure: TANDEM RING INSERTION;  Surgeon: Antony Blackbird, MD;  Location: [facility];  Service: Urology;  Laterality: N/A;   TANDEM RING INSERTION N/A 11/25/2020   Procedure: TANDEM RING INSERTION;  Surgeon: Antony Blackbird, MD;  Location: [facility];  Service: Urology;   Laterality: N/A;   TANDEM RING INSERTION N/A 11/29/2020   Procedure: TANDEM RING INSERTION;  Surgeon: Antony Blackbird, MD;  Location: [facility];  Service: Urology;  Laterality: N/A;   TANDEM RING INSERTION N/A 12/08/2020   Procedure: TANDEM RING INSERTION;  Surgeon: Antony Blackbird, MD;  Location: [facility];  Service: Urology;  Laterality: N/A;   WISDOM TOOTH EXTRACTION  1980s     Social History:   reports that she has never smoked. She has never used smokeless tobacco. She reports that she does not drink alcohol and does not use drugs.   Family History:  Her family history includes Brain cancer in her son; Diabetes in her father. There is no history of Breast cancer, Colon cancer, Ovarian cancer, Endometrial cancer, Prostate cancer, or Pancreatic cancer.   Allergies Allergies  Allergen Reactions   Lisinopril Anaphylaxis, Swelling and Other (See Comments)    Entire tongue became swollen and the exterior front of the throat.    Amlodipine Swelling and Other (See Comments)    Legs became swollen     Home Medications  Prior to Admission medications   Medication Sig Start Date End Date Taking? Authorizing Provider  atenolol (TENORMIN) 100 MG tablet Take 1 tablet (100 mg total) by mouth daily. Patient taking differently: Take 50 mg by mouth daily. 07/18/23  Yes Gorsuch, Ni, MD  cholecalciferol (VITAMIN D3) 25 MCG (1000 UNIT) tablet Take 2,000 Units by mouth daily.   Yes [provider]  diphenhydrAMINE (BENADRYL ALLERGY) 25 MG tablet Take 2 tablets (50 mg total) by mouth every 6 (six) hours as needed for allergies. 01/05/23  Yes Simonne Martinet, NP  ezetimibe (ZETIA) 10 MG tablet Take 10 mg by mouth in the morning.   Yes [provider]  glimepiride (AMARYL) 1 MG tablet Take 1 mg by mouth daily as needed (for an elevated BGL of 130 or greater). 07/07/22  Yes [provider]  levothyroxine (SYNTHROID) 50 MCG tablet TAKE 1 TABLET(50 MCG)  BY MOUTH DAILY BEFORE BREAKFAST Patient taking differently: Take 50 mcg  by mouth daily before breakfast. 10/09/23  Yes Gorsuch, Ni, MD  lidocaine-prilocaine (EMLA) cream Apply 1 Application topically as needed (for port access). 06/12/23  Yes Gorsuch, Ni, MD  ondansetron (ZOFRAN) 8 MG tablet TAKE 1 TABLET(8 MG) BY MOUTH EVERY 8 HOURS AS NEEDED FOR NAUSEA OR VOMITING. START ON THE THIRD DAY AFTER CHEMOTHERAPY 09/26/23  Yes Artis Delay, MD  prochlorperazine (COMPAZINE) 10 MG tablet Take 1 tablet (10 mg total) by mouth every 6 (six) hours as needed for nausea or vomiting. 09/01/22  Yes Artis Delay, MD  promethazine (PHENERGAN) 12.5 MG tablet Take 0.5 tablets (6.25 mg total) by mouth every 6 (six) hours as needed for nausea or vomiting. 10/12/23  Yes Maryanna Shape A, PA-C  rosuvastatin (CRESTOR) 40 MG tablet Take 40 mg by mouth at bedtime.   Yes [provider]  sitaGLIPtin (JANUVIA) 100 MG tablet Take 100 mg by mouth daily as needed (for an elevated BGL of 130 or greater). 03/30/22  Yes [provider]  traMADol (ULTRAM) 50 MG tablet Take 2 tablets (100 mg total) by mouth every 6 (six) hours as needed. Patient taking differently: Take 100 mg by mouth every 6 (six) hours as needed for moderate pain (pain score 4-6). 07/03/23  Yes Gorsuch, Ni, MD  TYLENOL 500 MG tablet Take 500-1,000 mg by mouth every 6 (six) hours as needed for mild pain or headache.   Yes [provider]  vitamin B-12 (CYANOCOBALAMIN) 1000 MCG tablet Take 1,000 mcg by mouth in the morning.   Yes [provider]  antiseptic oral rinse (BIOTENE) LIQD 15 mLs by Mouth Rinse route as needed for dry mouth. Patient not taking: Reported on 10/15/2023 07/11/23   Marinda Elk, MD     Critical care time: 60 min

## 2023-10-23 NOTE — Progress Notes (Signed)
OT Cancellation Note  Patient Details Name: KASEE HANTZ MRN: [id number] DOB: 05-10-1951   Cancelled Treatment:    Reason Eval/Treat Not Completed: Other (comment) Per nurse, GOC meeting in progress at this time. OT to continue to follow and check back for updated recommendations on 2/12.  Rosalio Loud, MS Acute Rehabilitation Department Office# [phone]  10/23/2023, 2:21 PM

## 2023-10-23 NOTE — Progress Notes (Signed)
eLink Physician-Brief Progress Note Patient Name: Misty Deleon DOB: [birth year] MRN: [id number]   Date of Service  10/23/2023  HPI/Events of Note  BSRN reports delirium this evening and moving in bed sporadically. Patient unable to express and unable to swallow. No clear pain. Unclear if anxious  eICU Interventions  Haldol IM once. Will continue to reassess   0200 AM. Continues to have restlessness and tachypnea. Ativan 0.5 mg IV once  Intervention Category Minor Interventions: Agitation / anxiety - evaluation and management  Arrick Dutton Mechele Collin 10/23/2023, 9:30 PM

## 2023-10-23 NOTE — Progress Notes (Addendum)
eLink Physician-Brief Progress Note Patient Name: ARDENE REMLEY DOB: [birth year] MRN: [id number]   Date of Service  10/23/2023  HPI/Events of Note  43F with cervical cancer on bevacizumab, CKD IIIA, HTN, DM2, recent C.diff infection admitted for N/V/D. Rapid response called on 2/11 for fever 103, tachycardia and tachypnea. Hypotensive with SBP 60s. Given LR 500 cc bolus. Started on levophed, currently on 6 mcg/min  Prior labs:  RVP 20 neg GIP neg  eICU Interventions  Repeat additional LR 500 cc bolus for total 1L Continue flagyl and cefepime F/u repeat labs including CBC, CMET, LA Primary team to formally consult CCM   3:29 AM Labs with K 3.2, CO2 16, Cr 2.39, Phos 1.4, Mg 1.5, worsening LA 4.1. WBC 5.5, Hg stable 8. Continue IVF resuscitation above with levophed. Replete electrolytes  4:50 AM Patient has port. Levophed orders changed from peripheral to central  Intervention Category Evaluation Type: New Patient Evaluation  Danessa Mensch Mechele Collin 10/23/2023, 3:00 AM

## 2023-10-23 NOTE — Progress Notes (Signed)
Bilateral lower extremity venous duplex has been completed. Preliminary results can be found in CV Proc through chart review.   10/23/23 11:54 AM Olen Cordial RVT

## 2023-10-23 NOTE — Progress Notes (Addendum)
ICU Consult    NAME:  Misty Deleon, MRN:  [id number], DOB:  [birth year], LOS: 5 ADMISSION DATE:  10/15/2023,  CONSULTATION DATE:  10/23/23 REFERRING: Johann Capers ACNPC,  CHIEF COMPLAINT:  Hypotension requiring Vasopressors    Brief History   73 year old female with cervical cancer on bevacizumab, CKD 3A, hypertension, DM2, recent C. difficile infection admitted for and/V/D. GI panel available (appears negative)  Past Medical History  Cervical cancer on bevacizumab T2DM CKD stage IIIa HTN HLD Hypothyroidism B12 deficiency anemia Recent C. difficile infection  Significant Hospital Events   Rapid response called for fever 103.1, tachycardia to 140 bpm and tachypnea 40 breaths/min. Patient hypotensive with verifiable blood pressures down to 60 systolic on arrival to ICU.  Temperature has been controlled with Tylenol/Ibuprofen given prior/ice packs and environmental control. Patient moved to ICU for closer monitoring and vasopressor use. In-and-Out urinary cath for UA yielded minimal urine output 5 cc. Tube feeding had been stopped on the floor due to concern for aspiration. Chest x-ray is available for review. 1 L bolus given Magnesium replacement in process Potassium replacement in process  Consults:  CCM - Dr. Lonzo Candy   Procedures:  Ultrasound-guided IV-IV team Port also currently accessed   Significant Diagnostic Tests:  Chest x-ray  "IMPRESSION: Nonvisualization of the left hemidiaphragm and blunting of left costophrenic angle suggestive of airspace opacity and/or pleural effusion. Finding may also be due to patient rotation. Recommend further evaluation with PA and lateral view of the chest with improved patient positioning.     Electronically Signed   By: Tish Frederickson M.D.   On: 10/23/2023 01:33"    Micro  Data:    Latest Reference Range & Units 10/23/23 02:26  Appearance CLEAR  CLOUDY !  Bilirubin Urine NEGATIVE  NEGATIVE  Color, Urine YELLOW  AMBER !  Glucose, UA NEGATIVE mg/dL 50 !  Hgb urine dipstick NEGATIVE  SMALL !  Ketones, ur NEGATIVE mg/dL 5 !  Leukocytes,Ua NEGATIVE  MODERATE !  Nitrite NEGATIVE  NEGATIVE  pH 5.0 - 8.0  5.0  Protein NEGATIVE mg/dL 841 !  Specific Gravity, Urine 1.005 - 1.030  1.031 (H)  Amorphous Crystal  PRESENT  Bacteria, UA NONE SEEN  FEW !  Mucus  PRESENT  RBC / HPF 0 - 5 RBC/hpf 0-5  Squamous Epithelial / HPF 0 - 5 /HPF 0-5  WBC, UA 0 - 5 WBC/hpf 0-5  !: Data is abnormal (H): Data is abnormally high  Antimicrobials:   Cefepime - 2 g every 12 hours Metronidazole-500 mg every 12 hours  Objective   Blood pressure (!) 104/57, pulse (!) 102, temperature 99.3 F (37.4 C), temperature source  Oral, resp. rate 20, height 5\' 1"  (1.549 m), weight 86.2 kg, SpO2 100%.        Intake/Output Summary (Last 24 hours) at 10/23/2023 0323 Last data filed at 10/23/2023 0311 Gross per 24 hour  Intake 1317.32 ml  Output 100 ml  Net 1217.32 ml   Filed Weights   10/21/23 0500 10/22/23 0500 10/23/23 0308  Weight: 85.9 kg 87 kg 86.2 kg    Examination: General: Drowsy, oriented to name HENT: dry MM Lungs: Tachypneic Cardiovascular: Tachycardic (sinus) Abdomen: soft Neuro: Lethargic, follows simple commands intermittently  Assessment & Plan:   Possible aspiration Chest x-ray obtained-available for review Lactic acid-pending Lab values-pending  Hypotension 1 - L fluid bolus -completed Vasopressor started to maintain MAP greater than 65 Consider albumin replacement  Electrolyte deficiency Magnesium replacement-in progress Potassium replacement-in progress  CCM consult Completed  Best practice:  Diet: N.p.o. Pain/Anxiety/Delirium protocol DVT prophylaxis: Lovenox GI prophylaxis: \ Glucose control: CBG every 4 hours Code Status: Full  code Family Communication: At bedside Disposition: ICU  Labs   CBC: Recent Labs  Lab 10/19/23 0237 10/19/23 0344 10/20/23 0438 10/21/23 0500 10/22/23 0500 10/23/23 0228  WBC 4.6 4.9 6.6 5.5 4.7 5.5  NEUTROABS 2.3  --   --   --   --   --   HGB 9.1* 9.1* 8.9* 8.4* 8.4* 8.0*  HCT 27.9* 28.3* 27.8* 26.0* 25.8* 25.2*  MCV 101.5* 100.0 101.1* 98.9 98.5 100.8*  PLT 181 197 182 173 190 165    Basic Metabolic Panel: Recent Labs  Lab 10/19/23 0344 10/20/23 0438 10/21/23 0500 10/22/23 0500 10/22/23 1700 10/23/23 0228  NA 135 135 133* 133*  --  134*  K 3.7 3.3* 2.9* 3.4*  --  3.2*  CL 104 105 103 106  --  107  CO2 19* 18* 20* 19*  --  16*  GLUCOSE 92 88 77 80  --  140*  BUN 15 15 14 14   --  18  CREATININE 1.11* 1.47* 1.37* 1.58*  --  2.39*  CALCIUM 9.3 8.9 8.5* 8.6*  --  8.5*  MG  --  1.1*  --   --  1.4* 1.5*  PHOS  --  3.3  --   --  1.6* 1.4*   GFR: Estimated Creatinine Clearance: 21.5 mL/min (A) (by C-G formula based on SCr of 2.39 mg/dL (H)). Recent Labs  Lab 10/19/23 0237 10/19/23 0344 10/20/23 0438 10/21/23 0500 10/22/23 0500 10/23/23 0228  PROCALCITON 16.38  --   --   --   --   --   WBC 4.6 4.9 6.6 5.5 4.7 5.5  LATICACIDVEN 1.4 1.5  --   --   --  4.1*    Liver Function Tests: Recent Labs  Lab 10/19/23 0344 10/20/23 0438 10/21/23 0500 10/22/23 0500 10/23/23 0228  AST 18 21 21 28  40  ALT 8 9 8 11 11   ALKPHOS 58 60 58 78 101  BILITOT 1.0 1.1 0.9 1.0 1.3*  PROT 5.4* 5.0* 4.7* 4.7* 4.4*  ALBUMIN 2.5* 2.3* 2.1* 2.0* 1.8*   No results for input(s): "LIPASE", "AMYLASE" in the last 168 hours. Recent Labs  Lab 10/19/23 0237  AMMONIA 18    ABG    Component Value Date/Time   TCO2 24 12/08/2020 0636     Coagulation Profile: No results for input(s): "INR", "PROTIME" in the last 168 hours.  Cardiac Enzymes: No results for input(s): "CKTOTAL", "CKMB", "CKMBINDEX", "TROPONINI" in the last 168 hours.  HbA1C: Hgb A1c MFr Bld  Date/Time Value Ref  Range Status  02/02/2023 08:41 AM 5.1 4.8 - 5.6 % Final    Comment:    (NOTE)         Prediabetes: 5.7 - 6.4         Diabetes: >6.4         Glycemic control for adults with diabetes: <7.0   01/04/2023 01:07 PM 4.5 (L) 4.8 - 5.6 % Final    Comment:    (NOTE)         Prediabetes: 5.7 - 6.4         Diabetes: >6.4         Glycemic control for adults with diabetes: <7.0     CBG: Recent Labs  Lab 10/22/23 1650 10/22/23 2327  GLUCAP 97 148*    Past Medical History  She,  has a past medical history of CKD (chronic kidney disease), stage III (HCC), Deficiency anemia, GERD (gastroesophageal reflux disease), History of radiation therapy (09/22/20-11/01/20), History of radiation therapy (11/09/2020-12/08/2020), Hypercholesteremia, Hypertension, Hypomagnesemia, Malignant neoplasm cervix [facility]) (oncologist--- dr gorsuch/  radiation oncology--- dr Roselind Messier), PCOS (polycystic ovarian syndrome), PMB (postmenopausal bleeding), Type 2 diabetes mellitus (HCC), and Wears contact lenses.   Surgical History    Past Surgical History:  Procedure Laterality Date   CESAREAN SECTION  x2 last one 1977   IR IMAGING GUIDED PORT INSERTION  09/16/2020   OPERATIVE ULTRASOUND N/A 11/09/2020   Procedure: OPERATIVE ULTRASOUND;  Surgeon: Antony Blackbird, MD;  Location: [facility];  Service: Urology;  Laterality: N/A;   OPERATIVE ULTRASOUND N/A 11/16/2020   Procedure: OPERATIVE ULTRASOUND;  Surgeon: Antony Blackbird, MD;  Location: [facility];  Service: Urology;  Laterality: N/A;   OPERATIVE ULTRASOUND N/A 11/25/2020   Procedure: OPERATIVE ULTRASOUND;  Surgeon: Antony Blackbird, MD;  Location: [facility];  Service: Urology;  Laterality: N/A;   OPERATIVE ULTRASOUND N/A 11/29/2020   Procedure: OPERATIVE ULTRASOUND;  Surgeon: Antony Blackbird, MD;  Location: [facility];  Service: Urology;  Laterality: N/A;   OPERATIVE ULTRASOUND N/A 12/08/2020   Procedure: OPERATIVE ULTRASOUND;   Surgeon: Antony Blackbird, MD;  Location: [facility];  Service: Urology;  Laterality: N/A;   TANDEM RING INSERTION N/A 11/09/2020   Procedure: TANDEM RING INSERTION;  Surgeon: Antony Blackbird, MD;  Location: [facility];  Service: Urology;  Laterality: N/A;   TANDEM RING INSERTION N/A 11/16/2020   Procedure: TANDEM RING INSERTION;  Surgeon: Antony Blackbird, MD;  Location: [facility];  Service: Urology;  Laterality: N/A;   TANDEM RING INSERTION N/A 11/25/2020   Procedure: TANDEM RING INSERTION;  Surgeon: Antony Blackbird, MD;  Location: [facility];  Service: Urology;  Laterality: N/A;   TANDEM RING INSERTION N/A 11/29/2020   Procedure: TANDEM RING INSERTION;  Surgeon: Antony Blackbird, MD;  Location[facility];  Service: Urology;  Laterality: N/A;   TANDEM RING INSERTION N/A 12/08/2020   Procedure: TANDEM RING INSERTION;  Surgeon: Antony Blackbird, MD;  Location: [facility];  Service: Urology;  Laterality: N/A;   WISDOM TOOTH EXTRACTION  1980s     Social History   reports that she has never smoked. She has never used smokeless tobacco. She reports that she does not drink alcohol and does not use drugs.   Family History   Her family history includes Brain cancer in her son; Diabetes in her father. There is no history of Breast cancer, Colon cancer, Ovarian cancer, Endometrial cancer, Prostate  cancer, or Pancreatic cancer.   Allergies Allergies  Allergen Reactions   Lisinopril Anaphylaxis, Swelling and Other (See Comments)    Entire tongue became swollen and the exterior front of the throat.    Amlodipine Swelling and Other (See Comments)    Legs became swollen     Home Medications  Prior to Admission medications   Medication Sig Start Date End Date Taking? Authorizing Provider  atenolol (TENORMIN) 100 MG tablet Take 1 tablet (100 mg total) by mouth daily. Patient taking differently: Take 50 mg by mouth daily.  07/18/23  Yes Gorsuch, Ni, MD  cholecalciferol (VITAMIN D3) 25 MCG (1000 UNIT) tablet Take 2,000 Units by mouth daily.   Yes [provider]  diphenhydrAMINE (BENADRYL ALLERGY) 25 MG tablet Take 2 tablets (50 mg total) by mouth every 6 (six) hours as needed for allergies. 01/05/23  Yes Simonne Martinet, NP  ezetimibe (ZETIA) 10 MG tablet Take 10 mg by mouth in the morning.   Yes [provider]  glimepiride (AMARYL) 1 MG tablet Take 1 mg by mouth daily as needed (for an elevated BGL of 130 or greater). 07/07/22  Yes [provider]  levothyroxine (SYNTHROID) 50 MCG tablet TAKE 1 TABLET(50 MCG) BY MOUTH DAILY BEFORE BREAKFAST Patient taking differently: Take 50 mcg by mouth daily before breakfast. 10/09/23  Yes Gorsuch, Ni, MD  lidocaine-prilocaine (EMLA) cream Apply 1 Application topically as needed (for port access). 06/12/23  Yes Gorsuch, Ni, MD  ondansetron (ZOFRAN) 8 MG tablet TAKE 1 TABLET(8 MG) BY MOUTH EVERY 8 HOURS AS NEEDED FOR NAUSEA OR VOMITING. START ON THE THIRD DAY AFTER CHEMOTHERAPY 09/26/23  Yes Artis Delay, MD  prochlorperazine (COMPAZINE) 10 MG tablet Take 1 tablet (10 mg total) by mouth every 6 (six) hours as needed for nausea or vomiting. 09/01/22  Yes Artis Delay, MD  promethazine (PHENERGAN) 12.5 MG tablet Take 0.5 tablets (6.25 mg total) by mouth every 6 (six) hours as needed for nausea or vomiting. 10/12/23  Yes Maryanna Shape A, PA-C  rosuvastatin (CRESTOR) 40 MG tablet Take 40 mg by mouth at bedtime.   Yes [provider]  sitaGLIPtin (JANUVIA) 100 MG tablet Take 100 mg by mouth daily as needed (for an elevated BGL of 130 or greater). 03/30/22  Yes [provider]  traMADol (ULTRAM) 50 MG tablet Take 2 tablets (100 mg total) by mouth every 6 (six) hours as needed. Patient taking differently: Take 100 mg by mouth every 6 (six) hours as needed for moderate pain (pain score 4-6). 07/03/23  Yes Gorsuch, Ni, MD  TYLENOL 500 MG tablet Take  500-1,000 mg by mouth every 6 (six) hours as needed for mild pain or headache.   Yes [provider]  vitamin B-12 (CYANOCOBALAMIN) 1000 MCG tablet Take 1,000 mcg by mouth in the morning.   Yes [provider]  antiseptic oral rinse (BIOTENE) LIQD 15 mLs by Mouth Rinse route as needed for dry mouth. Patient not taking: Reported on 10/15/2023 07/11/23   Marinda Elk, MD       ================================================ Update 0522 hrs CT ordered per CCM advisement - pending  Vancomycin was covered by CCM order Blood cultures ordered- pending  Currently ICU status under CCM    Chinita Greenland MSNA MSN ACNPC-AG Acute Care Nurse Practitioner [facility]

## 2023-10-23 NOTE — IPAL (Signed)
  Interdisciplinary Goals of Care Family Meeting   Date carried out: 10/23/2023  Location of the meeting: Bedside  Member's involved: Physician, Nurse Practitioner, and Family Member or next of kin  Durable Power of Attorney or acting medical decision maker: Husband,   Misty Deleon   Discussion: We discussed goals of care for [name] .  We talked about Misty Deleon's overnight decompensation and her concerning nutrition status. Her poor intake has been ongoing for months -- started getting significantly worse around thanksgiving with poor appetite, worse again following Cdiff with n/v, and she has been navigating these issues since - -with predictable unintentional weight loss.   We talked about code status. Misty Deleon and Misty Deleon have talked about not wanting to prolong the inevitable before, and have a very realistic understanding of care following resuscitation. We are updating her code status to DNR/I    Misty Deleon shares my concerns regarding her poor nutritional status and how this complicates her overall health decline, and her reserve to navigate critical illness. We will continue open dialogue about how she is doing -- if her critical illness worsens, discuss comfort care. If her critical illness levels out but her nutr status is failing to improve, would be reasonable to engage w palliative care to discuss GOC from there.   Code status:   Code Status: Limited: Do not attempt resuscitation (DNR) -DNR-LIMITED -Do Not Intubate/DNI    Disposition: Continue current acute care  Time spent for the meeting:    Lanier Clam, NP  10/23/2023, 9:32 AM

## 2023-10-23 NOTE — Significant Event (Signed)
Rapid Response Event Note   Reason for Call :  AMS, tachypnea (30-40), tachycardia (130-140 HR) febrile (103.1 f), concern of aspiration, hypotension (SBP 60'S).  Initial Focused Assessment:  See Flowsheet 0030 and provider note.  Interventions:  CXR, cbg, assessing vital signs.  Plan of Care:  Ice packs, room cooling, and Ibuprofen given for fever. Tube feed stopped. Awaiting xray reading. IVF bolus given. Placed on O2 2L/[place] to ease work of breathing. Transfer to 1227 ICU/SD due to concerns of needs for pressors due to hypotension. Provider consulting PCCM. In/out catheterization done to obtain urine specimen.  Event Summary:   MD Notified: Chinita Greenland, NP Call Time: 2345 Arrival Time: 0050 End Time: 0200  Lamona Curl, RN

## 2023-10-24 ENCOUNTER — Other Ambulatory Visit: Payer: Self-pay

## 2023-10-24 DIAGNOSIS — R627 Adult failure to thrive: Secondary | ICD-10-CM | POA: Diagnosis not present

## 2023-10-24 DIAGNOSIS — A419 Sepsis, unspecified organism: Secondary | ICD-10-CM

## 2023-10-24 DIAGNOSIS — Z515 Encounter for palliative care: Secondary | ICD-10-CM

## 2023-10-24 DIAGNOSIS — R197 Diarrhea, unspecified: Secondary | ICD-10-CM | POA: Diagnosis not present

## 2023-10-24 DIAGNOSIS — R112 Nausea with vomiting, unspecified: Secondary | ICD-10-CM | POA: Diagnosis not present

## 2023-10-24 LAB — CULTURE, BLOOD (ROUTINE X 2)
Culture: NO GROWTH
Culture: NO GROWTH
Special Requests: ADEQUATE

## 2023-10-24 LAB — HEMOGLOBIN A1C
Hgb A1c MFr Bld: 4.7 % — ABNORMAL LOW (ref 4.8–5.6)
Mean Plasma Glucose: 88 mg/dL

## 2023-10-24 LAB — VITAMIN B1: Vitamin B1 (Thiamine): 118.3 nmol/L (ref 66.5–200.0)

## 2023-10-24 MED ORDER — ACETAMINOPHEN 650 MG RE SUPP: 650.0000 mg | Freq: Four times a day (QID) | RECTAL | Status: DC | PRN

## 2023-10-24 MED ORDER — GLYCOPYRROLATE 0.2 MG/ML IJ SOLN: 0.2000 mg | INTRAMUSCULAR | Status: DC | PRN

## 2023-10-24 MED ORDER — GLYCOPYRROLATE 0.2 MG/ML IJ SOLN
0.2000 mg | INTRAMUSCULAR | Status: DC | PRN
Start: 2023-10-24 — End: 2023-10-24
  Administered 2023-10-24: 0.2 mg via INTRAVENOUS
  Filled 2023-10-24: qty 1

## 2023-10-24 MED ORDER — LORAZEPAM 2 MG/ML IJ SOLN: 0.5000 mg | Freq: Once | INTRAMUSCULAR | Status: DC

## 2023-10-24 MED ORDER — MORPHINE BOLUS VIA INFUSION
5.0000 mg | INTRAVENOUS | Status: DC | PRN
  Administered 2023-10-24 – 2023-10-25 (×20): 5 mg via INTRAVENOUS

## 2023-10-24 MED ORDER — ACETAMINOPHEN 325 MG PO TABS: 650.0000 mg | ORAL_TABLET | Freq: Four times a day (QID) | ORAL | Status: DC | PRN

## 2023-10-24 MED ORDER — MORPHINE 100MG IN NS 100ML (1MG/ML) PREMIX INFUSION
0.0000 mg/h | INTRAVENOUS | Status: DC
  Administered 2023-10-24: 5 mg/h via INTRAVENOUS
  Administered 2023-10-24 – 2023-10-25 (×3): 15 mg/h via INTRAVENOUS
  Administered 2023-10-25 (×3): 20 mg/h via INTRAVENOUS
  Filled 2023-10-24 (×7): qty 100

## 2023-10-24 MED ORDER — SODIUM CHLORIDE 0.9 % IV SOLN: INTRAVENOUS | Status: AC

## 2023-10-24 MED ORDER — LORAZEPAM 2 MG/ML IJ SOLN: 2.0000 mg | INTRAMUSCULAR | Status: DC | PRN

## 2023-10-24 MED ORDER — GLYCOPYRROLATE 1 MG PO TABS: 1.0000 mg | ORAL_TABLET | ORAL | Status: DC | PRN

## 2023-10-24 MED ORDER — POLYVINYL ALCOHOL 1.4 % OP SOLN: 1.0000 [drp] | Freq: Four times a day (QID) | OPHTHALMIC | Status: DC | PRN

## 2023-10-24 NOTE — Progress Notes (Signed)
NAME:  Misty Deleon, MRN:  [id number], DOB:  [birth year], LOS: 6 ADMISSION DATE:  10/15/2023, CONSULTATION DATE: 10/23/2023 REFERRING MD:  Luiz Iron, NP, CHIEF COMPLAINT: Shock  History of Present Illness:  A 73 y.o. female with cervical cancer on maintenance bevacizumab (last dose was 2 weeks ago), DM-2, CKD-3b, HTN, dyslipidemia, hypothyroidism, anemia of chronic illness, Vit B12 def, recent C. difficile infection (Nov 2024, UTI in Oct 2024) who presented to the ED for evaluation of persistent nausea, vomiting, and diarrhea. Oct 15, 2023 CT abdomen/pelvis with contrast showed proctitis, she was started on Cefepime and Flagyl. Early am today, she developed fever 103 F, sinus tachycardia, and tachypnea 30-40, and hypotension. She was given 1 L LR, Tylenol, Ibuprofen, and ice packs. She was started on Levophed due to non responsiveness to LR bolus, currently on 10 mcg/min. She is on NGT TF. No reported diarrhea or gross aspiration.    In speaking with her husband at bedside following ICU transfer, sounds like she has had a prolonged progressive decline, specifically from a nutr status standpoint -- dating back before thanksgiving 2024   Pertinent  Medical History  Cervical cancer on maintenance bevacizumab (last dose was 2 weeks ago), DM-2, CKD-3b, HTN, dyslipidemia, hypothyroidism, anemia of chronic illness, recent C. difficile infection (Nov 2024, UTI in Oct 2024), Vit B12 def  Significant Hospital Events: Including procedures, antibiotic start and stop dates in addition to other pertinent events   2/3 admitted w nv  2/7 ongoing lethargy, poor intake. Ngt placed . Anatomy felt possibly suitable to VP/KP, but w fevers eval deferred   2/8 did not tolerate diet progression 2/9 tolerated 25cc/hr EN 2/10 worse delirium, tried to adv diet but sounds like family wanted to defer this overnight 2/11 rapid overnight w fevers hypotension new LA   Interim History / Subjective:  Txf to ICU  overnight w escalating pressor req   Objective   Blood pressure (!) 100/58, pulse (!) 113, temperature 98.6 F (37 C), temperature source Axillary, resp. rate (!) 22, height 5\' 1"  (1.549 m), weight 86.2 kg, SpO2 97%.        Intake/Output Summary (Last 24 hours) at 10/24/2023 0813 Last data filed at 10/24/2023 0709 Gross per 24 hour  Intake 3573.2 ml  Output --  Net 3573.2 ml   Filed Weights   10/21/23 0500 10/22/23 0500 10/23/23 0308  Weight: 85.9 kg 87 kg 86.2 kg    Examination: General: Chronically ill appearing F NAD  Neuro: Awake, disoriented.  HENT:  Lungs: Even unlabored  Cardiovascular: rr  Abdomen: soft round  Extremities: no acute joint deformity    Resolved Hospital Problem list     Assessment & Plan:   GOC DNR FTT in adult Shock, favor septic Lactic acidosis  Acute encephalopathy Acute resp failure w hypoxia  ?Aspiration PNA  Enlarging LLL nodles (CT c/a/p 1/17)  Oral thrush AKI on CKD IIIb NAGMA R renal calculi Protein calorie malnutrition ?Refeeding  HypoK HypoMag Hypophos  Poor PO intake Persistent n/v Hx Cdiff (Nov 2024)  DM Hypothyroidism L3-4, L4-5 narrowing Metastatic cervical Ca (IVB) P -DNR -2/11 we had stopped painful/invasive interventions but the hope had been to cont pressors to hopefully facilitate son's arrival from [place]. With her BP and resp changes 2/12, family understands this is unlikely.  -we will instead shift entirely on comfort focussed care -add morphine, PRN BZD -dc all interventions not aimed at comfort, including pressors  -husband Jesusita Oka is a Education officer, environmental and has navigated a  lot of EOL situations with church family. Declines palliative care consult for now.     Best Practice (right click and "Reselect all SmartList Selections" daily)   Diet/type: tubefeeds DVT prophylaxis LMWH Pressure ulcer(s): N/A GI prophylaxis: H2B Lines: port-a-cath Foley:  N/A Code Status:  full code Last date of multidisciplinary goals  of care discussion []   Labs   CBC: Recent Labs  Lab 10/19/23 0237 10/19/23 0344 10/20/23 0438 10/21/23 0500 10/22/23 0500 10/23/23 0228  WBC 4.6 4.9 6.6 5.5 4.7 5.5  NEUTROABS 2.3  --   --   --   --   --   HGB 9.1* 9.1* 8.9* 8.4* 8.4* 8.0*  HCT 27.9* 28.3* 27.8* 26.0* 25.8* 25.2*  MCV 101.5* 100.0 101.1* 98.9 98.5 100.8*  PLT 181 197 182 173 190 165    Basic Metabolic Panel: Recent Labs  Lab 10/19/23 0344 10/20/23 0438 10/21/23 0500 10/22/23 0500 10/22/23 1700 10/23/23 0228  NA 135 135 133* 133*  --  134*  K 3.7 3.3* 2.9* 3.4*  --  3.2*  CL 104 105 103 106  --  107  CO2 19* 18* 20* 19*  --  16*  GLUCOSE 92 88 77 80  --  140*  BUN 15 15 14 14   --  18  CREATININE 1.11* 1.47* 1.37* 1.58*  --  2.39*  CALCIUM 9.3 8.9 8.5* 8.6*  --  8.5*  MG  --  1.1*  --   --  1.4* 1.5*  PHOS  --  3.3  --   --  1.6* 1.4*   GFR: Estimated Creatinine Clearance: 21.5 mL/min (A) (by C-G formula based on SCr of 2.39 mg/dL (H)). Recent Labs  Lab 10/19/23 0237 10/19/23 0344 10/20/23 0438 10/21/23 0500 10/22/23 0500 10/23/23 0228 10/23/23 0653  PROCALCITON 16.38  --   --   --   --   --   --   WBC 4.6 4.9 6.6 5.5 4.7 5.5  --   LATICACIDVEN 1.4 1.5  --   --   --  4.1* 3.3*    Liver Function Tests: Recent Labs  Lab 10/19/23 0344 10/20/23 0438 10/21/23 0500 10/22/23 0500 10/23/23 0228  AST 18 21 21 28  40  ALT 8 9 8 11 11   ALKPHOS 58 60 58 78 101  BILITOT 1.0 1.1 0.9 1.0 1.3*  PROT 5.4* 5.0* 4.7* 4.7* 4.4*  ALBUMIN 2.5* 2.3* 2.1* 2.0* 1.8*   No results for input(s): "LIPASE", "AMYLASE" in the last 168 hours. Recent Labs  Lab 10/19/23 0237 10/23/23 0653  AMMONIA 18 29    ABG    Component Value Date/Time   TCO2 24 12/08/2020 0636     Coagulation Profile: No results for input(s): "INR", "PROTIME" in the last 168 hours.  Cardiac Enzymes: No results for input(s): "CKTOTAL", "CKMB", "CKMBINDEX", "TROPONINI" in the last 168 hours.  HbA1C: Hgb A1c MFr Bld  Date/Time  Value Ref Range Status  02/02/2023 08:41 AM 5.1 4.8 - 5.6 % Final    Comment:    (NOTE)         Prediabetes: 5.7 - 6.4         Diabetes: >6.4         Glycemic control for adults with diabetes: <7.0   01/04/2023 01:07 PM 4.5 (L) 4.8 - 5.6 % Final    Comment:    (NOTE)         Prediabetes: 5.7 - 6.4         Diabetes: >6.4  Glycemic control for adults with diabetes: <7.0     CBG: Recent Labs  Lab 10/22/23 1650 10/22/23 2327 10/23/23 0818 10/23/23 1133  GLUCAP 97 148* 174* 190*    CRITICAL CARE Performed by: Lanier Clam   Total critical care time: 35 minutes  Critical care time was exclusive of separately billable procedures and treating other patients. Critical care was necessary to treat or prevent imminent or life-threatening deterioration.  Critical care was time spent personally by me on the following activities: development of treatment plan with patient and/or surrogate as well as nursing, discussions with consultants, evaluation of patient's response to treatment, examination of patient, obtaining history from patient or surrogate, ordering and performing treatments and interventions, ordering and review of laboratory studies, ordering and review of radiographic studies, pulse oximetry and re-evaluation of patient's condition.  Tessie Fass MSN, AGACNP-BC [facility] Pulmonary/Critical Care Medicine Amion for pager 10/24/2023, 8:13 AM

## 2023-10-24 NOTE — Progress Notes (Signed)
Nutrition Brief Note  Chart reviewed. Pt now transitioning to comfort care.  No further nutrition interventions planned at this time.  Please re-consult as needed.   Shelle Iron RD, LDN Contact via [employer].

## 2023-10-24 NOTE — Progress Notes (Signed)
Chaplain met with Misty Deleon's family to provide support. They are surrounding her and each other with loving support.  They shared stories about all of the ways that Misty Deleon lived her life to the fullest.  Present were her husband, sister and brother-in-law.  They are expecting more family tomorrow.  They have good support from their faith community and friends.  Her husband is a Education officer, environmental and they shared in ministry together over the years. Chaplain will continue to follow, but please page for additional needs.  [address] [place] Pager, [phone]

## 2023-10-24 NOTE — TOC Progression Note (Signed)
Transition of Care [facility]) - Progression Note    Patient Details  Name: BELEN PESCH MRN: [id number] Date of Birth: Mar 10, 1951  Transition of Care [facility]) CM/SW Contact  Amada Jupiter, LCSW Phone Number: 10/24/2023, 10:37 AM  Clinical Narrative:     Noted pt now on comfort care.  TOC will monitor for now.          Expected Discharge Plan and Services                                               Social Determinants of Health (SDOH) Interventions SDOH Screenings   Food Insecurity: No Food Insecurity (10/16/2023)  Housing: Low Risk  (10/16/2023)  Transportation Needs: No Transportation Needs (10/16/2023)  Utilities: Not At Risk (10/16/2023)  Social Connections: Socially Integrated (10/16/2023)  Tobacco Use: Low Risk  (10/18/2023)    Readmission Risk Interventions    07/16/2023   10:49 AM 07/09/2023    4:06 PM  Readmission Risk Prevention Plan  Transportation Screening Complete Complete  PCP or Specialist Appt within 5-7 Days  Complete  PCP or Specialist Appt within 3-5 Days Complete   Home Care Screening  Complete  Medication Review (RN CM)  Complete  HRI or Home Care Consult Complete   Social Work Consult for Recovery Care Planning/Counseling Complete   Palliative Care Screening Not Applicable   Medication Review Oceanographer) Complete

## 2023-10-25 ENCOUNTER — Inpatient Hospital Stay: Payer: Medicare PPO

## 2023-10-25 ENCOUNTER — Inpatient Hospital Stay: Payer: Medicare PPO | Admitting: Hematology and Oncology

## 2023-10-25 DIAGNOSIS — R627 Adult failure to thrive: Secondary | ICD-10-CM | POA: Diagnosis not present

## 2023-10-25 LAB — GLUCOSE, CAPILLARY: Glucose-Capillary: 165 mg/dL — ABNORMAL HIGH (ref 70–99)

## 2023-10-25 LAB — ZINC: Zinc: 37 ug/dL — ABNORMAL LOW (ref 44–115)

## 2023-10-25 MED ORDER — MIDAZOLAM BOLUS VIA INFUSION
1.0000 mg | INTRAVENOUS | Status: DC | PRN
  Administered 2023-10-25 (×6): 1 mg via INTRAVENOUS

## 2023-10-25 MED ORDER — HYDROMORPHONE HCL 2 MG/ML IJ SOLN
4.0000 mg | Freq: Once | INTRAMUSCULAR | Status: AC
  Administered 2023-10-25: 4 mg via INTRAVENOUS

## 2023-10-25 MED ORDER — HYDROMORPHONE BOLUS VIA INFUSION
1.0000 mg | INTRAVENOUS | Status: DC | PRN
  Filled 2023-10-25: qty 1

## 2023-10-25 MED ORDER — MIDAZOLAM-SODIUM CHLORIDE 100-0.9 MG/100ML-% IV SOLN
0.5000 mg/h | INTRAVENOUS | Status: DC
  Administered 2023-10-25: 0.5 mg/h via INTRAVENOUS
  Filled 2023-10-25: qty 100

## 2023-10-25 MED ORDER — HYDROMORPHONE HCL 1 MG/ML IJ SOLN
INTRAMUSCULAR | Status: AC
  Filled 2023-10-25: qty 4

## 2023-10-25 MED ORDER — HYDROMORPHONE BOLUS VIA INFUSION: 0.5000 mg | INTRAVENOUS | Status: DC | PRN

## 2023-10-25 MED ORDER — MORPHINE SULFATE (PF) 4 MG/ML IV SOLN
4.0000 mg | Freq: Once | INTRAVENOUS | Status: AC
  Administered 2023-10-25: 4 mg via INTRAVENOUS
  Filled 2023-10-25: qty 1

## 2023-10-25 MED ORDER — HYDROMORPHONE HCL-NACL 50-0.9 MG/50ML-% IV SOLN: 4.0000 mg/h | INTRAVENOUS | Status: DC

## 2023-10-25 MED ORDER — HYDROMORPHONE HCL-NACL 50-0.9 MG/50ML-% IV SOLN
0.0000 mg/h | INTRAVENOUS | Status: DC
  Filled 2023-10-25: qty 50

## 2023-10-25 MED ORDER — MIDAZOLAM BOLUS VIA INFUSION
1.0000 mg | INTRAVENOUS | Status: DC | PRN
  Administered 2023-10-25 (×4): 5 mg via INTRAVENOUS

## 2023-10-28 LAB — CULTURE, BLOOD (ROUTINE X 2)
Culture: NO GROWTH
Culture: NO GROWTH

## 2023-11-10 NOTE — Death Summary Note (Signed)
DEATH SUMMARY   Patient Details  Name: Misty Deleon MRN: [id number] DOB: 04-10-1951  Admission/Discharge Information   Admit Date:  2023-11-01  Date of Death: Date of Death: 11/11/23  Time of Death: Time of Death: 1800  Length of Stay: 7  Referring Physician: Marletta Lor, NP    Diagnoses  Preliminary cause of death: metastatic cervical cancer x 4 years Secondary Diagnoses (including complications and co-morbidities):  GOC DNR FTT in adult Shock, favor septic Lactic acidosis  Acute encephalopathy Acute resp failure w hypoxia  ?Aspiration PNA  Enlarging LLL nodles (CT c/a/p 1/17)  Oral thrush AKI on CKD IIIb NAGMA R renal calculi Protein calorie malnutrition ?Refeeding  HypoK HypoMag Hypophos  Poor PO intake Persistent n/v   Brief Hospital Course (including significant findings, care, treatment, and services provided and events leading to death)  A 73 y.o. female with cervical cancer on maintenance bevacizumab (last dose was 2 weeks ago), DM-2, CKD-3b, HTN, dyslipidemia, hypothyroidism, anemia of chronic illness, Vit B12 def, recent C. difficile infection (Nov 2024, UTI in Oct 2024) who presented to the ED for evaluation of persistent nausea, vomiting, and diarrhea. 2023/11/01 CT abdomen/pelvis with contrast showed proctitis, she was started on Cefepime and Flagyl. Early am today, she developed fever 103 F, sinus tachycardia, and tachypnea 30-40, and hypotension. She was given 1 L LR, Tylenol, Ibuprofen, and ice packs. She was started on Levophed due to non responsiveness to LR bolus, currently on 10 mcg/min. She is on NGT TF. No reported diarrhea or gross aspiration.     In speaking with her husband at bedside following ICU transfer, sounds like she has had a prolonged progressive decline, specifically from a nutr status standpoint -- dating back before thanksgiving 2024   We discussed goals of care for Misty Deleon .  We talked about Misty Deleon's overnight  decompensation and her concerning nutrition status. Her poor intake has been ongoing for months -- started getting significantly worse around thanksgiving with poor appetite, worse again following Cdiff with n/v, and she has been navigating these issues since - -with predictable unintentional weight loss.   Patient's shock worsened and after discussion with family, goals transitioned to full comfort and she passed with family at bedside.  Pertinent Labs and Studies  Significant Diagnostic Studies VAS [place] LOWER EXTREMITY VENOUS (DVT) Result Date: 10/23/2023  Lower Venous DVT Study Patient Name:  Misty Deleon  Date of Exam:   10/23/2023 Medical Rec #: [id number]          Accession #:    [id number] Date of Birth: 1951/03/11          Patient Gender: F Patient Age:   22 years Exam Location:  [facility] Procedure:      VAS [place] LOWER EXTREMITY VENOUS (DVT) Referring Phys: GRACE BOWSER --------------------------------------------------------------------------------  Indications: Edema.  Risk Factors: None identified. Limitations: Poor ultrasound/tissue interface and patient positioning, patient immobility. Comparison Study: No prior studies. Performing Technologist: Chanda Busing RVT  Examination Guidelines: A complete evaluation includes B-mode imaging, spectral Doppler, color Doppler, and power Doppler as needed of all accessible portions of each vessel. Bilateral testing is considered an integral part of a complete examination. Limited examinations for reoccurring indications may be performed as noted. The reflux portion of the exam is performed with the patient in reverse Trendelenburg.  +---------+---------------+---------+-----------+----------+--------------+ RIGHT    CompressibilityPhasicitySpontaneityPropertiesThrombus Aging +---------+---------------+---------+-----------+----------+--------------+ CFV      Full           Yes  Yes                                  +---------+---------------+---------+-----------+----------+--------------+ SFJ      Full                                                        +---------+---------------+---------+-----------+----------+--------------+ FV Prox  Full                                                        +---------+---------------+---------+-----------+----------+--------------+ FV Mid   Full                                                        +---------+---------------+---------+-----------+----------+--------------+ FV DistalFull                                                        +---------+---------------+---------+-----------+----------+--------------+ PFV      Full                                                        +---------+---------------+---------+-----------+----------+--------------+ POP      Full           Yes      Yes                                 +---------+---------------+---------+-----------+----------+--------------+ PTV      Full                                                        +---------+---------------+---------+-----------+----------+--------------+ PERO     Full                                                        +---------+---------------+---------+-----------+----------+--------------+   +---------+---------------+---------+-----------+----------+--------------+ LEFT     CompressibilityPhasicitySpontaneityPropertiesThrombus Aging +---------+---------------+---------+-----------+----------+--------------+ CFV      Full           Yes      Yes                                 +---------+---------------+---------+-----------+----------+--------------+ SFJ      Full                                                        +---------+---------------+---------+-----------+----------+--------------+  FV Prox  Full                                                         +---------+---------------+---------+-----------+----------+--------------+ FV Mid   Full                                                        +---------+---------------+---------+-----------+----------+--------------+ FV DistalFull                                                        +---------+---------------+---------+-----------+----------+--------------+ PFV      Full                                                        +---------+---------------+---------+-----------+----------+--------------+ POP      Full           Yes      Yes                                 +---------+---------------+---------+-----------+----------+--------------+ PTV      Full                                                        +---------+---------------+---------+-----------+----------+--------------+ PERO     Full                                                        +---------+---------------+---------+-----------+----------+--------------+     Summary: RIGHT: - There is no evidence of deep vein thrombosis in the lower extremity.  - No cystic structure found in the popliteal fossa.  LEFT: - There is no evidence of deep vein thrombosis in the lower extremity.  - No cystic structure found in the popliteal fossa.  *See table(s) above for measurements and observations. Electronically signed by Lemar Livings MD on 10/23/2023 at 1:27:28 PM.    Final    DG CHEST PORT 1 VIEW Result Date: 10/23/2023 CLINICAL DATA:  [id number] Aspiration into airway [id number] EXAM: PORTABLE CHEST 1 VIEW COMPARISON:  Chest x-ray 10/19/2023, CT chest 09/28/2023 FINDINGS: Patient is rotated. Right chest wall accessed Port-A-Cath with tip terminating along the superior caval junction. Enteric tube courses below the hemidiaphragm with tip and side port overlying the expected region of the gastric lumen. The heart and mediastinal contours are within normal limits. Nonvisualization of the left hemidiaphragm and blunting  of left costophrenic angle suggestive of airspace opacity and/or pleural effusion. No pulmonary  edema. No right pleural effusion. No pneumothorax. No acute osseous abnormality. IMPRESSION: Nonvisualization of the left hemidiaphragm and blunting of left costophrenic angle suggestive of airspace opacity and/or pleural effusion. Finding may also be due to patient rotation. Recommend further evaluation with PA and lateral view of the chest with improved patient positioning. Electronically Signed   By: Tish Frederickson M.D.   On: 10/23/2023 01:33   CT ABDOMEN PELVIS W CONTRAST Result Date: 10/20/2023 CLINICAL DATA:  Abdominal pain.  Constipation. EXAM: CT ABDOMEN AND PELVIS WITH CONTRAST TECHNIQUE: Multidetector CT imaging of the abdomen and pelvis was performed using the standard protocol following bolus administration of intravenous contrast. RADIATION DOSE REDUCTION: This exam was performed according to the departmental dose-optimization program which includes automated exposure control, adjustment of the mA and/or kV according to patient size and/or use of iterative reconstruction technique. CONTRAST:  OMNIPAQUE IOHEXOL 300 MG/ML  SOLN COMPARISON:  10/15/2023 FINDINGS: Lower Chest: No acute findings. Hepatobiliary: No suspicious hepatic masses identified. Gallbladder is unremarkable. No evidence of biliary ductal dilatation. Pancreas:  No mass or inflammatory changes. Spleen: Within normal limits in size and appearance. Adrenals/Urinary Tract: No suspicious masses identified. Right renal calculi are again seen, largest measuring 8 mm. No evidence of ureteral calculi or hydronephrosis. Unremarkable unopacified urinary bladder. Stomach/Bowel: Enteric tube is seen with tip in the distal gastric antrum. Mild rectal wall thickening and perirectal/presacral soft tissue stranding are seen, suspicious for proctitis. No evidence of perirectal fistula or abscess. Diverticulosis is seen mainly involving the sigmoid  colon, however there is no evidence of diverticulitis. No significant stool burden seen. Vascular/Lymphatic: No pathologically enlarged lymph nodes. No acute vascular findings. Reproductive:  No mass or other significant abnormality. Other:  Mild diffuse body wall edema noted. Musculoskeletal:  No suspicious bone lesions identified. IMPRESSION: Mild rectal wall thickening and perirectal/presacral soft tissue stranding, suspicious for proctitis. No evidence of perirectal fistula or abscess. Colonic diverticulosis, without radiographic evidence of diverticulitis. No significant stool burden. Right nephrolithiasis. No evidence of ureteral calculi or hydronephrosis. Mild diffuse body wall edema. Electronically Signed   By: Danae Orleans M.D.   On: 10/20/2023 17:38   DG Abd 1 View Result Date: 10/19/2023 CLINICAL DATA:  NG tube placement. EXAM: ABDOMEN - 1 VIEW COMPARISON:  Earlier same day. FINDINGS: NG tube tip is in the proximal stomach, just below the GE junction. Side port of the NG tube is in the distal esophagus. Mild gaseous bowel distention again noted in the visualized upper abdomen. Patchy opacity at the left base may be atelectatic or infectious etiology. Right Port-A-Cath again noted. IMPRESSION: NG tube tip is in the proximal stomach, just below the GE junction. Side port of the NG tube is in the distal esophagus. NG tube could be advanced 9-10 cm to place the side port distal to the gastroesophageal junction, as clinically warranted. Electronically Signed   By: Kennith Center M.D.   On: 10/19/2023 13:17   DG Abd Portable 1V Result Date: 10/19/2023 CLINICAL DATA:  Check gastric catheter placement EXAM: PORTABLE ABDOMEN - 1 VIEW COMPARISON:  None Available. FINDINGS: Nasogastric catheter is noted extending into the right mainstem bronchus and subsequently into the right lower lobe at the level of the diaphragm. This should be withdrawn and readvanced. No pneumothorax is noted. IMPRESSION: Gastric catheter  is noted deep in the right lung. This should be withdrawn completely and readvanced. These results will be called to the ordering clinician or representative by the Radiologist Assistant, and communication documented in  the PACS or [employer]. Electronically Signed   By: Alcide Clever M.D.   On: 10/19/2023 11:16   DG Chest Port 1 View Result Date: 10/19/2023 CLINICAL DATA:  Altered mental status. EXAM: PORTABLE CHEST 1 VIEW COMPARISON:  July 06, 2023 FINDINGS: There is stable right-sided venous Port-A-Cath positioning. The heart size and mediastinal contours are within normal limits. Both lungs are clear. Multilevel degenerative changes seen throughout the thoracic spine. IMPRESSION: No active disease. Electronically Signed   By: Aram Candela M.D.   On: 10/19/2023 01:36   MR LUMBAR SPINE W CONTRAST Result Date: 10/18/2023 CLINICAL DATA:  Initial evaluation for persistent edema about the L4 vertebral body fracture. Evaluate for underlying lesion. EXAM: MRI LUMBAR SPINE WITH CONTRAST TECHNIQUE: Multiplanar and multiecho pulse sequences of the lumbar spine were obtained with intravenous contrast. CONTRAST:  8mL GADAVIST GADOBUTROL 1 MMOL/ML IV SOLN COMPARISON:  Prior noncontrast MRI from 10/16/2023 FINDINGS: Segmentation: Transitional lumbosacral anatomy with sacralization of the L5 vertebral body. Same numbering system is employed as on previous exam. Alignment: Physiologic with preservation of the normal lumbar lordosis. Vertebrae: Previously identified fracture involving the inferior endplate of L4 again seen, stable from prior MRI. Fractured demonstrates postcontrast enhancement, consistent with an evolving acute to subacute fracture. No convincing or visible underlying mass lesion or other pathologic features by MRI, although evaluation is somewhat limited by the diffuse edema at this level. No other worrisome osseous lesions or abnormal enhancement elsewhere within the lumbar spine. Vertebral  body height otherwise maintained. Conus medullaris and cauda equina: Conus extends to the L1 level. Conus and cauda equina appear normal. Paraspinal and other soft tissues: Mild paraspinous edema and enhancement about the L4 vertebral body. No other acute finding. Disc levels: Underlying lumbar spondylosis with multilevel facet arthrosis, described on recent MRI. No new findings. IMPRESSION: 1. Postcontrast enhancement involving the previously identified fracture involving the inferior endplate of L4, consistent with an evolving acute to subacute fracture. No convincing underlying mass lesion or other pathologic features seen on today's exam, although evaluation is somewhat limited by the diffuse enhancement. If there remains concern for a possible underlying occult pathologic lesion, a short interval follow-up MRI could be performed for further evaluation as warranted. 2. No other new abnormality within the lumbar spine. Electronically Signed   By: Rise Mu M.D.   On: 10/18/2023 23:27   MR LUMBAR SPINE WO CONTRAST Result Date: 10/16/2023 CLINICAL DATA:  Back paint EXAM: MRI LUMBAR SPINE WITHOUT CONTRAST TECHNIQUE: Multiplanar, multisequence MR imaging of the lumbar spine was performed. No intravenous contrast was administered. COMPARISON:  CT chest abdomen and pelvis 05/17/2023, CT abdomen and pelvis 10/15/2023 FINDINGS: Segmentation: Transitional anatomy with sacralization of L5 and a rudimentary disc space at L5-S1. Alignment:  Physiologic. Vertebrae: Redemonstrated inferior endplate compression deformity at L4 with persistent bone marrow edema. No acute fracture. No evidence of discitis/osteomyelitis. No focal bone lesion. Conus medullaris and cauda equina: Conus extends to the L1 level. Conus and cauda equina appear normal. Paraspinal and other soft tissues: Negative. Disc levels: T12-L1: Unremarkable L1-L2: Circumferential disc bulge. Mild bilateral facet degenerative change. No significant  spinal canal narrowing. Mild bilateral neural foraminal narrowing. L2-L3: Mild bilateral facet degenerative change. Minimal disc bulge. No significant spinal canal narrowing. Moderate left and mild right neural foraminal narrowing. L3-L4: Mild bilateral facet degenerative change. Circumferential disc bulge. Moderate to severe spinal canal narrowing. Moderate to severe bilateral neural foraminal narrowing. L4-L5: Mild bilateral facet degenerative change. Circumferential disc bulge. Mild spinal canal narrowing.  There is narrowing of the bilateral lateral recesses. Moderate to severe bilateral neural foraminal narrowing. L5-S1: Mild bilateral facet degenerative change. No spinal canal or neural foraminal narrowing. IMPRESSION: Transitional anatomy with sacralization of L5 and a rudimentary disc space at L5-S1. 1. Redemonstrated inferior endplate compression deformity at L4 with persistent bone marrow edema. 2. Moderate to severe spinal canal narrowing at L3-L4 secondary to a combination of a circumferential disc bulge and ligamentum flavum hypertrophy. 3. Moderate to severe bilateral neural foraminal narrowing at L3-L4 and L4-L5. Electronically Signed   By: Lorenza Cambridge M.D.   On: 10/16/2023 18:42   CT ABDOMEN PELVIS W CONTRAST Result Date: 10/15/2023 CLINICAL DATA:  Acute abdominal pain. History of metastatic cervical cancer. EXAM: CT ABDOMEN AND PELVIS WITH CONTRAST TECHNIQUE: Multidetector CT imaging of the abdomen and pelvis was performed using the standard protocol following bolus administration of intravenous contrast. RADIATION DOSE REDUCTION: This exam was performed according to the departmental dose-optimization program which includes automated exposure control, adjustment of the mA and/or kV according to patient size and/or use of iterative reconstruction technique. CONTRAST:  OMNIPAQUE IOHEXOL 300 MG/ML  SOLN COMPARISON:  CT chest abdomen and pelvis 09/28/2023. FINDINGS: Lower chest: No acute  abnormality. Hepatobiliary: No focal liver abnormality is seen. No gallstones, gallbladder wall thickening, or biliary dilatation. Pancreas: Unremarkable. No pancreatic ductal dilatation or surrounding inflammatory changes. Spleen: Spleen is upper limits of normal in size. Calcified granulomas are present. Adrenals/Urinary Tract: There is an 8 mm calculus in the inferior pole the right kidney. There is no hydronephrosis or perinephric fat stranding. The adrenal glands are within normal limits. There is mild fat stranding surrounding the bladder. Stomach/Bowel: Stomach is within normal limits. Appendix appears normal. There is no bowel obstruction, pneumatosis or free air. There some mild rectal wall thickening. There is sigmoid colon diverticulosis. Vascular/Lymphatic: Aortic atherosclerosis. No enlarged abdominal or pelvic lymph nodes. Reproductive: Uterus and bilateral adnexa are unremarkable. Other: Presacral edema and perirectal edema appear unchanged. There is no ascites or focal abdominal wall hernia. Musculoskeletal: There stable compression deformity of the inferior endplate of L5. IMPRESSION: 1. Mild fat stranding surrounding the bladder worrisome for cystitis. 2. Mild rectal wall thickening worrisome for proctitis. 3. Nonobstructing right renal calculus. 4. Sigmoid colon diverticulosis. 5. Stable presacral and perirectal edema. 6. Aortic atherosclerosis. Aortic Atherosclerosis (ICD10-I70.0). Electronically Signed   By: Darliss Cheney M.D.   On: 10/15/2023 17:06   CT Head Wo Contrast Result Date: 10/12/2023 CLINICAL DATA:  Vertigo, central dizziness, nausea, 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 RADIATION DOSE REDUCTION: This exam was performed according to the departmental dose-optimization program which includes automated exposure control,  adjustment of the mA and/or kV according to patient size and/or use of iterative reconstruction technique. COMPARISON:  MRI head June 20, 2007. FINDINGS: Brain: No evidence of acute infarction, hemorrhage, hydrocephalus, extra-axial collection or mass lesion/mass effect. Partially empty sella. Vascular: Calcific atherosclerosis. Skull: No acute fracture. Sinuses/Orbits: Clear sinuses.  No acute orbital findings. Other: No sizable mastoid effusions. IMPRESSION: No evidence of acute intracranial abnormality. Electronically Signed   By: Feliberto Harts M.D.   On: 10/12/2023 22:45    Microbiology Recent Results (from the past 240 hours)  Gastrointestinal Panel by PCR , Stool     Status: None   Collection Time: 10/20/23  2:35 AM   Specimen: Stool  Result Value Ref Range Status   Campylobacter species NOT DETECTED NOT DETECTED Final   Plesimonas shigelloides NOT DETECTED NOT DETECTED Final   Salmonella species NOT DETECTED NOT DETECTED Final   Yersinia enterocolitica NOT DETECTED NOT DETECTED Final   Vibrio species NOT DETECTED NOT DETECTED Final   Vibrio cholerae NOT DETECTED NOT DETECTED Final   Enteroaggregative E coli (EAEC) NOT DETECTED NOT DETECTED Final   Enteropathogenic E coli (EPEC) NOT DETECTED NOT DETECTED Final   Enterotoxigenic E coli (ETEC) NOT DETECTED NOT DETECTED Final   Shiga like toxin producing E coli (STEC) NOT DETECTED NOT DETECTED Final   Shigella/Enteroinvasive E coli (EIEC) NOT DETECTED NOT DETECTED Final   Cryptosporidium NOT DETECTED NOT DETECTED Final   Cyclospora cayetanensis NOT DETECTED NOT DETECTED Final   Entamoeba histolytica NOT DETECTED NOT DETECTED Final   Giardia lamblia NOT DETECTED NOT DETECTED Final   Adenovirus F40/41 NOT DETECTED NOT DETECTED Final   Astrovirus NOT DETECTED NOT DETECTED Final   Norovirus GI/GII NOT DETECTED NOT DETECTED Final   Rotavirus A NOT DETECTED NOT DETECTED Final   Sapovirus (I, II, IV, and V) NOT DETECTED NOT DETECTED  Final    Comment: Performed at [facility], [address] [place], [place] [address]  Culture, blood (Routine X 2) w Reflex to ID Panel     Status: None   Collection Time: 10/23/23  8:15 AM   Specimen: BLOOD  Result Value Ref Range Status   Specimen Description   Final    BLOOD RIGHT ANTECUBITAL Performed at [facility], [address], [place], [place] [address]    Special Requests   Final    BOTTLES DRAWN AEROBIC AND ANAEROBIC Blood Culture results may not be optimal due to an inadequate volume of blood received in culture bottles Performed at [facility], [address], [place], [place] [address]    Culture   Final    NO GROWTH 5 DAYS Performed at [facility], [address] [place], [place] [address]    Report Status 10/28/2023 FINAL  Final  Culture, blood (Routine X 2) w Reflex to ID Panel     Status: None   Collection Time: 10/23/23  8:15 AM   Specimen: BLOOD RIGHT HAND  Result Value Ref Range Status   Specimen Description   Final    BLOOD RIGHT HAND Performed at [facility], [address], [place], [place] [address]    Special Requests   Final    BOTTLES DRAWN AEROBIC ONLY Blood Culture results may not be optimal due to an inadequate volume of blood received in culture bottles Performed at [facility], [address], [place], [place] [address]    Culture   Final    NO GROWTH 5 DAYS Performed at [facility], [address] [place], [place] [address]    Report Status 10/28/2023 FINAL  Final    Lab Basic Metabolic Panel: Recent Labs  Lab 10/22/23 1700 10/23/23 0228  NA  --  134*  K  --  3.2*  CL  --  107  CO2  --  16*  GLUCOSE  --  140*  BUN  --  18  CREATININE  --  2.39*  CALCIUM  --  8.5*  MG 1.4* 1.5*  PHOS 1.6* 1.4*   Liver Function Tests: Recent Labs  Lab 10/23/23 0228  AST 40  ALT 11  ALKPHOS 101  BILITOT 1.3*  PROT 4.4*  ALBUMIN 1.8*   No  results for input(s): "LIPASE", "AMYLASE" in the last 168 hours. Recent Labs  Lab 10/23/23 0653  AMMONIA 29   CBC: Recent Labs  Lab 10/23/23 0228  WBC 5.5  HGB 8.0*  HCT 25.2*  MCV 100.8*  PLT 165   Cardiac Enzymes: No results for input(s): "CKTOTAL", "CKMB", "CKMBINDEX", "TROPONINI" in the last 168 hours. Sepsis Labs: Recent Labs  Lab 10/23/23 0228 10/23/23 0653  WBC 5.5  --   LATICACIDVEN 4.1* 3.3*      Lorin Glass 10/29/2023, 2:29 PM

## 2023-11-10 NOTE — Progress Notes (Signed)
10/31/2023     I have seen and evaluated the patient for EOL care.   S:  No events, on morphine gtt.   O: Blood pressure 96/66, pulse 64, temperature 97.6 F (36.4 C), temperature source Oral, resp. rate 17, height 5\' 3"  (1.6 m), weight 81.6 kg, SpO2 94 %.    Shallow inspiratory effort, bradypneic Unresponsive   A:  Metastatic cervical cancer, FTT, starvation at EOL   P:  Morphine and versed gtt with pushes PRN titrated to comfort Anticipate in hospital death   Myrla Halsted MD [facility] Prefer epic messenger for cross cover needs If after hours, please call E-link

## 2023-11-10 NOTE — Progress Notes (Signed)
   10/14/2023 1019  Spiritual Encounters  Type of Visit Follow up  Care provided to: Pt and family  Reason for visit End-of-life  OnCall Visit No   Visited the patient and family to offer additional support. Patient's spouse and 3 other relatives present at bedside. 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 Provided spiritual car/support and prayer. Spouse asked that prayer includes the request that "Jesus come swiftly and take her home."   Advised family that chaplains remain available to them.

## 2023-11-10 DEATH — deceased

## 2023-11-15 ENCOUNTER — Ambulatory Visit: Payer: Medicare PPO | Admitting: Hematology and Oncology

## 2023-11-15 ENCOUNTER — Other Ambulatory Visit: Payer: Medicare PPO

## 2023-11-15 ENCOUNTER — Ambulatory Visit: Payer: Medicare PPO

## 2023-12-06 ENCOUNTER — Ambulatory Visit: Payer: Medicare PPO

## 2023-12-06 ENCOUNTER — Ambulatory Visit: Payer: Medicare PPO | Admitting: Hematology and Oncology

## 2023-12-06 ENCOUNTER — Other Ambulatory Visit: Payer: Medicare PPO
